# Patient Record
Sex: Male | Born: 1955
Health system: Southern US, Community
[De-identification: ages and names within clinical notes are randomized; demographics above are authoritative.]

## PROBLEM LIST (undated history)

## (undated) DIAGNOSIS — N4830 Priapism, unspecified: Secondary | ICD-10-CM

## (undated) DIAGNOSIS — Z8619 Personal history of other infectious and parasitic diseases: Principal | ICD-10-CM

## (undated) DIAGNOSIS — R7303 Prediabetes: Secondary | ICD-10-CM

## (undated) DIAGNOSIS — Z5111 Encounter for antineoplastic chemotherapy: Secondary | ICD-10-CM

## (undated) DIAGNOSIS — R0600 Dyspnea, unspecified: Secondary | ICD-10-CM

## (undated) DIAGNOSIS — I10 Essential (primary) hypertension: Secondary | ICD-10-CM

## (undated) DIAGNOSIS — C9000 Multiple myeloma not having achieved remission: Principal | ICD-10-CM

## (undated) DIAGNOSIS — K219 Gastro-esophageal reflux disease without esophagitis: Secondary | ICD-10-CM

## (undated) HISTORY — DX: Encounter for antineoplastic chemotherapy: Z51.11

## (undated) HISTORY — DX: Essential (primary) hypertension: I10

## (undated) HISTORY — PX: COLONOSCOPY: SHX174

## (undated) HISTORY — DX: Personal history of other infectious and parasitic diseases: Z86.19

## (undated) HISTORY — PX: POLYPECTOMY: SHX149

## (undated) HISTORY — DX: Multiple myeloma not having achieved remission: C90.00

---

## 2011-03-24 ENCOUNTER — Emergency Department (INDEPENDENT_AMBULATORY_CARE_PROVIDER_SITE_OTHER): Payer: Self-pay

## 2011-03-24 ENCOUNTER — Encounter: Payer: Self-pay | Admitting: *Deleted

## 2011-03-24 ENCOUNTER — Emergency Department (HOSPITAL_BASED_OUTPATIENT_CLINIC_OR_DEPARTMENT_OTHER)
Admission: EM | Admit: 2011-03-24 | Discharge: 2011-03-24 | Disposition: A | Discharge status: Home / Self Care | Payer: Self-pay | Attending: Emergency Medicine | Admitting: Emergency Medicine

## 2011-03-24 DIAGNOSIS — F1410 Cocaine abuse, uncomplicated: Secondary | ICD-10-CM | POA: Insufficient documentation

## 2011-03-24 DIAGNOSIS — R109 Unspecified abdominal pain: Secondary | ICD-10-CM | POA: Insufficient documentation

## 2011-03-24 LAB — URINALYSIS, ROUTINE W REFLEX MICROSCOPIC
Bilirubin Urine: NEGATIVE
Glucose, UA: NEGATIVE mg/dL
Hgb urine dipstick: NEGATIVE
Specific Gravity, Urine: 1.019 (ref 1.005–1.030)
Urobilinogen, UA: 0.2 mg/dL (ref 0.0–1.0)
pH: 6 (ref 5.0–8.0)

## 2011-03-24 LAB — COMPREHENSIVE METABOLIC PANEL
ALT: 46 U/L (ref 0–53)
AST: 31 U/L (ref 0–37)
Calcium: 9.2 mg/dL (ref 8.4–10.5)
GFR calc Af Amer: >60 mL/min (ref >60)
Glucose, Bld: 96 mg/dL (ref 70–99)
Sodium: 141 mEq/L (ref 135–145)
Total Protein: 6.6 g/dL (ref 6.0–8.3)

## 2011-03-24 LAB — DIFFERENTIAL
Basophils Relative: 1 % (ref 0–1)
Lymphs Abs: 1.8 10*3/uL (ref 0.7–4.0)
Monocytes Absolute: 0.6 10*3/uL (ref 0.1–1.0)
Monocytes Relative: 10 % (ref 3–12)
Neutro Abs: 3.2 10*3/uL (ref 1.7–7.7)

## 2011-03-24 LAB — CBC
HCT: 41.5 % (ref 39.0–52.0)
Hemoglobin: 14.3 g/dL (ref 13.0–17.0)
MCHC: 34.5 g/dL (ref 30.0–36.0)

## 2011-03-24 MED ORDER — SODIUM CHLORIDE 0.9 % IV SOLN
Freq: Once | INTRAVENOUS | Status: AC
  Administered 2011-03-24: 09:00:00 via INTRAVENOUS

## 2011-03-24 MED ORDER — ONDANSETRON HCL 4 MG/2ML IJ SOLN
4.0000 mg | Freq: Once | INTRAMUSCULAR | Status: AC
  Administered 2011-03-24: 4 mg via INTRAVENOUS
  Filled 2011-03-24: qty 2

## 2011-03-24 MED ORDER — IOHEXOL 300 MG/ML  SOLN
100.0000 mL | Freq: Once | INTRAMUSCULAR | Status: AC | PRN
  Administered 2011-03-24: 100 mL via INTRAVENOUS

## 2011-03-24 MED ORDER — ONDANSETRON HCL 4 MG PO TABS: 4.0000 mg | ORAL_TABLET | Freq: Four times a day (QID) | ORAL | Status: AC

## 2011-03-24 MED ORDER — KETOROLAC TROMETHAMINE 30 MG/ML IJ SOLN
30.0000 mg | Freq: Once | INTRAMUSCULAR | Status: AC
  Administered 2011-03-24: 30 mg via INTRAVENOUS
  Filled 2011-03-24: qty 1

## 2011-03-24 NOTE — ED Notes (Signed)
Error in documentation. Pt did not have sedation start.

## 2011-03-24 NOTE — ED Notes (Signed)
Reports generalized abdominal pain since yesterday. Nausea yesterday, denies vomiting. Denies urinary symptoms.

## 2011-03-24 NOTE — ED Provider Notes (Signed)
History     CSN: [id number] Arrival date & time: 03/24/2011  8:19 AM  Chief Complaint  Patient presents with  . Abdominal Pain   Patient is a 55 y.o. male presenting with abdominal pain. The history is provided by the patient.  Abdominal Pain The primary symptoms of the illness include abdominal pain. The primary symptoms of the illness do not include fever, shortness of breath or dysuria. The current episode started yesterday. The onset of the illness was gradual. The problem has not changed since onset. Associated with: after eating a little debbie, kool aid and a three muskateer candy bar. The patient has not had a change in bowel habit. Additional symptoms associated with the illness include constipation. Symptoms associated with the illness do not include chills, diaphoresis, hematuria or back pain. Significant associated medical issues do not include inflammatory bowel disease, diabetes, gallstones, liver disease or diverticulitis.   Is in a rehab facility for the last 9 days for crack abuse. He thought he may be constipated so he used maalox and mag citrate last night with loose BM today but still having the same lower ABD cramping/ unchanged since onset History reviewed. No pertinent past medical history.  History reviewed. No pertinent past surgical history.  No family history on file.  History  Substance Use Topics  . Smoking status: Current Everyday Smoker -- 1.0 packs/day    Types: Cigarettes  . Smokeless tobacco: Not on file  . Alcohol Use: No      Review of Systems  Constitutional: Negative for fever, chills and diaphoresis.  HENT: Negative for neck pain and neck stiffness.   Eyes: Negative for pain.  Respiratory: Negative for shortness of breath.   Cardiovascular: Negative for chest pain and leg swelling.  Gastrointestinal: Positive for abdominal pain and constipation.  Genitourinary: Negative for dysuria and hematuria.  Musculoskeletal: Negative for back pain.    Skin: Negative for rash.  Neurological: Negative for headaches.  All other systems reviewed and are negative.    Physical Exam  BP 139/93  Pulse 65  Temp(Src) 97.9 F (36.6 C) (Oral)  Resp 16  Ht 5\' 5"  (1.651 m)  Wt 135 lb (61.236 kg)  BMI 22.47 kg/m2  SpO2 100%  Physical Exam  Constitutional: He is oriented to person, place, and time. He appears well-developed and well-nourished.  HENT:  Head: Normocephalic and atraumatic.  Eyes: Conjunctivae and EOM are normal. Pupils are equal, round, and reactive to light.  Neck: Trachea normal. Neck supple. No thyromegaly present.  Cardiovascular: Normal rate, regular rhythm, S1 normal, S2 normal and normal pulses.     No systolic murmur is present   No diastolic murmur is present  Pulses:      Radial pulses are 2+ on the right side, and 2+ on the left side.  Pulmonary/Chest: Effort normal and breath sounds normal. He has no wheezes. He has no rhonchi. He has no rales. He exhibits no tenderness.  Abdominal: Soft. Normal appearance and bowel sounds are normal. He exhibits no distension, no ascites, no pulsatile midline mass and no mass. There is tenderness in the right lower quadrant and left lower quadrant. There is no rigidity, no rebound, no guarding, no CVA tenderness and negative Murphy's sign. No hernia.  Musculoskeletal:       BLE:s Calves nontender, no cords or erythema, negative Homans sign  Neurological: He is alert and oriented to person, place, and time. He has normal strength. No cranial nerve deficit or sensory deficit. GCS eye  subscore is 4. GCS verbal subscore is 5. GCS motor subscore is 6.  Skin: Skin is warm and dry. No rash noted. He is not diaphoretic.  Psychiatric: His speech is normal.       Cooperative and appropriate    ED Course  Procedures  MDM Adult male with lower ABD pain and tenderness, evaluated with CT scan, labs obtained and pain medications provided, avoiding narcotics as PT is currently  in rehab for  crack abuse. He has been eating 3 meals a day with snacks where as before rehab he was eating only 1-2 small meals per day. Serial exams no peritonitis, recheck at 12:25pm is feeling much better and feels comfortable to go back to [facility].     Results for orders placed during the hospital encounter of 03/24/11  COMPREHENSIVE METABOLIC PANEL      Component Value Range   Sodium 141  135 - 145 (mEq/L)   Potassium 4.4  3.5 - 5.1 (mEq/L)   Chloride 104  96 - 112 (mEq/L)   CO2 29  19 - 32 (mEq/L)   Glucose, Bld 96  70 - 99 (mg/dL)   BUN 13  6 - 23 (mg/dL)   Creatinine, Ser 8.46  0.50 - 1.35 (mg/dL)   Calcium 9.2  8.4 - 96.2 (mg/dL)   Total Protein 6.6  6.0 - 8.3 (g/dL)   Albumin 3.7  3.5 - 5.2 (g/dL)   AST 31  0 - 37 (U/L)   ALT 46  0 - 53 (U/L)   Alkaline Phosphatase 65  39 - 117 (U/L)   Total Bilirubin 0.2 (*) 0.3 - 1.2 (mg/dL)   GFR calc non Af Amer >60  >60 (mL/min)   GFR calc Af Amer >60  >60 (mL/min)  CBC      Component Value Range   WBC 5.9  4.0 - 10.5 (K/uL)   RBC 4.32  4.22 - 5.81 (MIL/uL)   Hemoglobin 14.3  13.0 - 17.0 (g/dL)   HCT 95.2  84.1 - 32.4 (%)   MCV 96.1  78.0 - 100.0 (fL)   MCH 33.1  26.0 - 34.0 (pg)   MCHC 34.5  30.0 - 36.0 (g/dL)   RDW 40.1  02.7 - 25.3 (%)   Platelets 209  150 - 400 (K/uL)  DIFFERENTIAL      Component Value Range   Neutrophils Relative 54  43 - 77 (%)   Neutro Abs 3.2  1.7 - 7.7 (K/uL)   Lymphocytes Relative 30  12 - 46 (%)   Lymphs Abs 1.8  0.7 - 4.0 (K/uL)   Monocytes Relative 10  3 - 12 (%)   Monocytes Absolute 0.6  0.1 - 1.0 (K/uL)   Eosinophils Relative 5  0 - 5 (%)   Eosinophils Absolute 0.3  0.0 - 0.7 (K/uL)   Basophils Relative 1  0 - 1 (%)   Basophils Absolute 0.0  0.0 - 0.1 (K/uL)  URINALYSIS, ROUTINE W REFLEX MICROSCOPIC      Component Value Range   Color, Urine YELLOW  YELLOW    Appearance CLEAR  CLEAR    Specific Gravity, Urine 1.019  1.005 - 1.030    pH 6.0  5.0 - 8.0    Glucose, UA NEGATIVE  NEGATIVE (mg/dL)   Hgb  urine dipstick NEGATIVE  NEGATIVE    Bilirubin Urine NEGATIVE  NEGATIVE    Ketones, ur NEGATIVE  NEGATIVE (mg/dL)   Protein, ur NEGATIVE  NEGATIVE (mg/dL)   Urobilinogen, UA 0.2  0.0 - 1.0 (mg/dL)   Nitrite NEGATIVE  NEGATIVE    Leukocytes, UA NEGATIVE  NEGATIVE      Ct Abdomen Pelvis W Contrast  03/24/2011  *RADIOLOGY REPORT*  Clinical Data: Mid to lower abdominal pain.  CT ABDOMEN AND PELVIS WITH CONTRAST  Technique:  Multidetector CT imaging of the abdomen and pelvis was performed following the standard protocol during bolus administration of intravenous contrast.  Contrast: 100 ml Omnipaque-300  Comparison: None.  Findings: The liver, spleen, pancreas, adrenal glands, and kidneys are normal.  The bowel is normal including the terminal ileum and appendix.  No free air or free fluid in the abdomen.  No acute osseous abnormality.  Mild arthritic changes of both femoral heads. Slight tortuosity of the thoracic aorta.  Is the patient hypertensive?  IMPRESSION: Benign-appearing abdomen and pelvis.  Original Report Authenticated By: Gwynn Burly, M.D.        Sunnie Nielsen, MD 03/24/11 1227

## 2011-03-24 NOTE — ED Notes (Signed)
Pt transferred to CT Scan 

## 2011-04-06 ENCOUNTER — Emergency Department (HOSPITAL_COMMUNITY)
Admission: EM | Admit: 2011-04-06 | Discharge: 2011-04-06 | Disposition: A | Discharge status: Home / Self Care | Payer: Self-pay | Attending: Emergency Medicine | Admitting: Emergency Medicine

## 2011-04-06 ENCOUNTER — Encounter (HOSPITAL_BASED_OUTPATIENT_CLINIC_OR_DEPARTMENT_OTHER): Payer: Self-pay | Admitting: *Deleted

## 2011-04-06 ENCOUNTER — Emergency Department (HOSPITAL_BASED_OUTPATIENT_CLINIC_OR_DEPARTMENT_OTHER)
Admission: EM | Admit: 2011-04-06 | Discharge: 2011-04-06 | Disposition: A | Discharge status: Other Acute Inpatient Hospital | Payer: Self-pay | Attending: Emergency Medicine | Admitting: Emergency Medicine

## 2011-04-06 DIAGNOSIS — N4830 Priapism, unspecified: Secondary | ICD-10-CM | POA: Insufficient documentation

## 2011-04-06 DIAGNOSIS — F17200 Nicotine dependence, unspecified, uncomplicated: Secondary | ICD-10-CM | POA: Insufficient documentation

## 2011-04-06 LAB — BASIC METABOLIC PANEL
CO2: 28 mEq/L (ref 19–32)
Calcium: 9.9 mg/dL (ref 8.4–10.5)
Chloride: 107 mEq/L (ref 96–112)
Creatinine, Ser: 0.9 mg/dL (ref 0.50–1.35)
Glucose, Bld: 101 mg/dL — ABNORMAL HIGH (ref 70–99)

## 2011-04-06 LAB — CBC
HCT: 40.7 % (ref 39.0–52.0)
Hemoglobin: 14.3 g/dL (ref 13.0–17.0)
MCH: 33.6 pg (ref 26.0–34.0)
MCV: 95.5 fL (ref 78.0–100.0)
RBC: 4.26 MIL/uL (ref 4.22–5.81)

## 2011-04-06 MED ORDER — MORPHINE SULFATE 4 MG/ML IJ SOLN
4.0000 mg | Freq: Once | INTRAMUSCULAR | Status: AC
  Administered 2011-04-06: 4 mg via INTRAVENOUS
  Filled 2011-04-06: qty 1

## 2011-04-06 MED ORDER — TERBUTALINE SULFATE 1 MG/ML IJ SOLN
0.5000 mg | Freq: Once | INTRAMUSCULAR | Status: AC
  Administered 2011-04-06: 0.5 mg via SUBCUTANEOUS

## 2011-04-06 MED ORDER — TERBUTALINE SULFATE 1 MG/ML IJ SOLN
INTRAMUSCULAR | Status: AC
  Filled 2011-04-06: qty 1

## 2011-04-06 MED ORDER — SODIUM CHLORIDE 0.9 % IV BOLUS (SEPSIS): 250.0000 mL | Freq: Once | INTRAVENOUS | Status: DC

## 2011-04-06 NOTE — ED Provider Notes (Signed)
Medical screening examination/treatment/procedure(s) were performed by non-physician practitioner and as supervising physician I was immediately available for consultation/collaboration.    Forbes Cellar, MD 04/06/11 [phone]

## 2011-04-06 NOTE — ED Notes (Signed)
Pt states he is in treatment at [facility]  Was given a new med for sleeping last night has had priaprism since then

## 2011-04-06 NOTE — ED Notes (Signed)
Report given to [facility] pt being transferred to [facility] long er to see urologist for the unresolved priaprism

## 2011-04-06 NOTE — ED Provider Notes (Signed)
History     CSN: [id number] Arrival date & time: 04/06/2011 11:44 AM  No chief complaint on file.  Patient is a 55 y.o. male presenting with male genitourinary complaint. The history is provided by the patient. No language interpreter was used.  Male GU Problem Primary symptoms include penile pain and priapism. This is a new problem. The current episode started 6 to 12 hours ago. The problem occurs constantly. The problem has not changed since onset.The symptoms occur spontaneously. Pertinent negatives include no nausea, no vomiting, no abdominal pain and no frequency. There has been no fever. He has tried narcotics for the symptoms. The treatment provided no relief. Sexual activity: non-contributory. Partner displays symptoms of an STD: no. Associated medical issues do not include gonorrhea, syphilis, chlamydia or erectile dysfunction.  Pt reports he is at [facility] treatment center.  Pt reports they gave him trazadone last pm to help him sleep.  Pt reports he has had an erection since 7:00am this morning.    History reviewed. No pertinent past medical history.  History reviewed. No pertinent past surgical history.  History reviewed. No pertinent family history.  History  Substance Use Topics  . Smoking status: Current Everyday Smoker -- 1.0 packs/day    Types: Cigarettes  . Smokeless tobacco: Not on file  . Alcohol Use: No      Review of Systems  Gastrointestinal: Negative for nausea, vomiting and abdominal pain.  Genitourinary: Positive for penile swelling and penile pain. Negative for frequency.  All other systems reviewed and are negative.    Physical Exam  BP 134/92  Pulse 80  Temp(Src) 98.4 F (36.9 C) (Oral)  Resp 20  SpO2 100%  Physical Exam  Nursing note and vitals reviewed. Constitutional: He is oriented to person, place, and time. He appears well-developed and well-nourished.  HENT:  Head: Normocephalic and atraumatic.  Eyes: Conjunctivae and EOM are normal.  Pupils are equal, round, and reactive to light.  Neck: Normal range of motion.  Cardiovascular: Normal rate.   Pulmonary/Chest: Effort normal.  Abdominal: Soft.  Genitourinary:       Penis tender, engorged  Musculoskeletal: Normal range of motion.  Neurological: He is alert and oriented to person, place, and time. He has normal reflexes.  Skin: Skin is warm and dry.  Psychiatric: He has a normal mood and affect.    ED Course  Procedures  MDM  patient given IV normal saline x1 L morphine 4 mg IV, terbutaline 0.5 subcutaneous. Patient observed x15 minutes with no change patient given additional 4 mg of morphine IV his discomfort is improved however he still has an erection. I spoke to Dr. Laverle Patter urology on call who advised transferring the patient to [facility] long emergency department where he could evaluate the patient. I advised Dr. Lynelle Doctor the patient will be transferred there by [facility].      Langston Masker, [place] 04/06/11 1338

## 2011-04-23 ENCOUNTER — Emergency Department (HOSPITAL_BASED_OUTPATIENT_CLINIC_OR_DEPARTMENT_OTHER)
Admission: EM | Admit: 2011-04-23 | Discharge: 2011-04-23 | Disposition: A | Discharge status: Home / Self Care | Payer: Self-pay | Attending: Emergency Medicine | Admitting: Emergency Medicine

## 2011-04-23 ENCOUNTER — Encounter (HOSPITAL_BASED_OUTPATIENT_CLINIC_OR_DEPARTMENT_OTHER): Payer: Self-pay | Admitting: *Deleted

## 2011-04-23 DIAGNOSIS — F17200 Nicotine dependence, unspecified, uncomplicated: Secondary | ICD-10-CM | POA: Insufficient documentation

## 2011-04-23 DIAGNOSIS — N4830 Priapism, unspecified: Secondary | ICD-10-CM | POA: Insufficient documentation

## 2011-04-23 MED ORDER — SODIUM CHLORIDE 0.9 % IV BOLUS (SEPSIS)
1000.0000 mL | Freq: Once | INTRAVENOUS | Status: AC
  Administered 2011-04-23: 1000 mL via INTRAVENOUS

## 2011-04-23 MED ORDER — PHENYLEPHRINE HCL 10 MG/ML IJ SOLN
0.1000 mg | Freq: Once | INTRAMUSCULAR | Status: AC
  Administered 2011-04-23: 0.1 mg via INTRAMUSCULAR
  Filled 2011-04-23: qty 1

## 2011-04-23 MED ORDER — MORPHINE SULFATE 4 MG/ML IJ SOLN
4.0000 mg | Freq: Once | INTRAMUSCULAR | Status: AC
  Administered 2011-04-23: 4 mg via INTRAVENOUS
  Filled 2011-04-23: qty 1

## 2011-04-23 MED ORDER — PHENYLEPHRINE HCL 10 MG/ML IJ SOLN
0.1000 mg | Freq: Once | INTRAMUSCULAR | Status: AC
  Administered 2011-04-23: 0.1 mg via INTRAMUSCULAR

## 2011-04-23 NOTE — ED Notes (Signed)
Erection resolved after injections

## 2011-04-23 NOTE — ED Provider Notes (Signed)
History     CSN: [id number] Arrival date & time: 04/23/2011  5:52 PM  Chief Complaint  Patient presents with  . Penis Pain   HPI Comments: 55yoM h/o cocaine abuse, priapism thought to be 2/2 trazodone pw painful erection. Per patient began with awakening this morning >12 hour pta. No relief at home. Denies painful urination (except as noted nsg notes) Denies hematuria//freq/urgency. No abd pain/n/v. In rehab, last cocaine use 8/31, states that he has not continued to take trazodone. No h/o sickle cell disease   Patient is a 55 y.o. male presenting with penile pain.  Penis Pain    Pmhx: cocaine abuse  History reviewed. No pertinent past surgical history.  History reviewed. No pertinent family history.  History  Substance Use Topics  . Smoking status: Current Everyday Smoker -- 1.0 packs/day    Types: Cigarettes  . Smokeless tobacco: Not on file  . Alcohol Use: No     Review of Systems  Genitourinary: Positive for penile pain.  All other systems reviewed and are negative.  except as noted HPI   Physical Exam  BP 152/104  Pulse 66  Temp(Src) 98.6 F (37 C) (Oral)  Resp 20  Ht 5\' 5"  (1.651 m)  Wt 150 lb (68.04 kg)  BMI 24.96 kg/m2  SpO2 100%  Physical Exam  Nursing note and vitals reviewed. Constitutional: He is oriented to person, place, and time. He appears well-developed and well-nourished. No distress.  HENT:  Head: Atraumatic.  Mouth/Throat: Oropharynx is clear and moist.  Eyes: Conjunctivae are normal. Pupils are equal, round, and reactive to light.  Neck: Neck supple.  Cardiovascular: Normal rate, regular rhythm, normal heart sounds and intact distal pulses.  Exam reveals no gallop and no friction rub.   No murmur heard. Pulmonary/Chest: Effort normal. No respiratory distress. He has no wheezes. He has no rales.  Abdominal: Soft. Bowel sounds are normal. There is no tenderness. There is no rebound and no guarding.  Genitourinary:       +erect penis, no  urethral discharge  Musculoskeletal: Normal range of motion. He exhibits no edema and no tenderness.  Neurological: He is alert and oriented to person, place, and time.  Skin: Skin is warm and dry.  Psychiatric: He has a normal mood and affect.    ED Course  Procedures Verbal conset obtained. Risks/benefits explained Time out performed prior to procedure, pt verified Penis prepped with alcohol 1% phenylephrine .1mg  injected x 2 into shaft of penis 10 minutes apart with complete relief of priapism.   MDM Priapism, resolved with above measures which were recommended by urology Dr. Patsi Sears. F/U urology outpatient. Precautions for return.   Stefano Gaul, MD       Forbes Cellar, MD 04/24/11 [place] Manners

## 2011-04-23 NOTE — ED Notes (Signed)
Pt states this happened August 22 after ? Taking Trazadone. Seen by neurosurgeon. Woke up this a.m. With same. Painful urination.

## 2011-05-10 ENCOUNTER — Emergency Department (HOSPITAL_COMMUNITY)
Admission: EM | Admit: 2011-05-10 | Discharge: 2011-05-10 | Disposition: A | Discharge status: Home / Self Care | Payer: Self-pay | Attending: Emergency Medicine | Admitting: Emergency Medicine

## 2011-05-10 DIAGNOSIS — N4830 Priapism, unspecified: Secondary | ICD-10-CM | POA: Insufficient documentation

## 2011-05-10 DIAGNOSIS — F17200 Nicotine dependence, unspecified, uncomplicated: Secondary | ICD-10-CM | POA: Insufficient documentation

## 2011-05-15 NOTE — Consult Note (Signed)
NAME:  Erik Nguyen, Erik Nguyen NO.:  [id number]  MEDICAL RECORD NO.:  [id number]  LOCATION:  [facility]                         FACILITY:  [facility]  PHYSICIAN:  Valetta Fuller, MD    DATE OF BIRTH:  1955-09-15  DATE OF CONSULTATION:  05/10/2011 DATE OF DISCHARGE:  05/10/2011                                CONSULTATION   REASON FOR CONSULTATION:  Priapism.  HISTORY OF PRESENT ILLNESS:  This is a 55 year old gentleman with no significant past medical history.  He awakened this morning at approximately 8 a.m. with a penile erection.  He was unable to detumesce himself.  The erection became painful over the next 3-4 hours.  He presented to [facility] Emergency Department for treatment and evaluation at that time.  The patient states this initially occurred in August 2012 after taking a prescription for trazodone.  He presented to the emergency room at that time for long-lasting painful erection.  He was irrigated and injected with phenylephrine at that visit.  The patient states that this has occurred every 2-3 weeks since that time in August, requiring irrigation and injection in the emergency room.  He was to follow up with Dr. Laverle Patter on an outpatient basis for further evaluation.  He has not made an appointment as of yet.  He was also told in August to stop taking his prescription for trazodone.  Today, the patient continues to complain of long-lasting painful erection.  He denies any sickle cell disease/trait.  He denies taking any oral medications including antipsychotics or medication such as PDS inhibitor such as Viagra.  He denies any use of illegal substances such as cocaine, marijuana, or Ecstasy.  He does have a history of illicit drug use including cocaine.  He denies any fever, chills, nausea, or vomiting.  He was giving subcutaneous terbutaline in the emergency room today for 3 doses without response.  PAST MEDICAL HISTORY:  Tobacco abuse.  ALLERGIES:  He has no  known drug allergies.  MEDICATIONS:  He takes no home medications.  FAMILY HISTORY:  Noncontributory.  SOCIAL HISTORY:  [place]  He does currently use tobacco.  He denies any recent alcohol or drug use.  He does have a history of alcohol and illicit drug use including cocaine.  REVIEW OF SYSTEMS:  As stated per HPI, complains of long-lasting painful erection.  Denies any fever, chills, nausea, or vomiting.  Denies any chest pain or shortness of breath.  PHYSICAL EXAMINATION:  VITAL SIGNS:  Temperature 97.9, pulse 67, respirations 20, blood pressure 126/80. CONSTITUTIONAL:  He is a well-developed, well-nourished white male, in no acute distress, although in obvious discomfort. HEENT:  Normocephalic, atraumatic.  Oropharynx is clear. ABDOMEN:  Soft, nontender, and nondistended. 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  He does have bilateral descended testes without lesion or mass. NEURO:  Remote and recent memory are intact.  PROCEDURE:  Glans penis was cleansed with Betadine solution usingsterile technique.  Penile nerve block was performed with 1% plain lidocaine at the bases of the penis and lateral position.  After penile nerve block was performed, a 19-gauge butterfly needle was  inserted and irrigation was performed with removal of approximately 30 cc of dark venous blood.  At that time, phenylephrine injection was performed and flushed with approximately 10 cc of saline to flush through.  Again, he was irrigated afterward for removal of approximately 15-20 cc of more dark venous blood.  The penis did detumesce.  The 19-gauge needle was removed and the pressure was applied to the needlestick site.  Bandage was applied afterward and ice pack was placed.  IMPRESSION/PLAN:  Recurrent priapism.  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 Bandage  and pressure was applied to injection site along with ice pack. If penis remains detumescence in approximately 30-45 minutes, he may be discharged home with plans to follow up on an outpatient basis with Dr. Laverle Patter for further workup and evaluation.     Delia Chimes, NP   ______________________________ Valetta Fuller, MD    MA/MEDQ  D:  05/10/2011  T:  05/10/2011  Job:  [id number]  Electronically Signed by Delia Chimes NP on 05/15/2011 12:06:05 PM Electronically Signed by Barron Alvine M.D. on 05/15/2011 06:49:24 PM

## 2012-06-21 ENCOUNTER — Emergency Department (HOSPITAL_COMMUNITY): Payer: Self-pay

## 2012-06-21 ENCOUNTER — Emergency Department (HOSPITAL_COMMUNITY)
Admission: EM | Admit: 2012-06-21 | Discharge: 2012-06-21 | Disposition: A | Discharge status: Home / Self Care | Payer: Self-pay | Attending: Emergency Medicine | Admitting: Emergency Medicine

## 2012-06-21 ENCOUNTER — Encounter (HOSPITAL_COMMUNITY): Payer: Self-pay | Admitting: Emergency Medicine

## 2012-06-21 DIAGNOSIS — M549 Dorsalgia, unspecified: Secondary | ICD-10-CM | POA: Insufficient documentation

## 2012-06-21 DIAGNOSIS — F17200 Nicotine dependence, unspecified, uncomplicated: Secondary | ICD-10-CM | POA: Insufficient documentation

## 2012-06-21 MED ORDER — DIAZEPAM 5 MG PO TABS: 5.0000 mg | ORAL_TABLET | Freq: Four times a day (QID) | ORAL | Status: DC | PRN

## 2012-06-21 MED ORDER — IBUPROFEN 600 MG PO TABS: 600.0000 mg | ORAL_TABLET | Freq: Four times a day (QID) | ORAL | Status: DC | PRN

## 2012-06-21 MED ORDER — KETOROLAC TROMETHAMINE 60 MG/2ML IM SOLN
60.0000 mg | Freq: Once | INTRAMUSCULAR | Status: AC
  Administered 2012-06-21: 60 mg via INTRAMUSCULAR
  Filled 2012-06-21: qty 2

## 2012-06-21 MED ORDER — OXYCODONE-ACETAMINOPHEN 5-325 MG PO TABS: 1.0000 | ORAL_TABLET | Freq: Four times a day (QID) | ORAL | Status: DC | PRN

## 2012-06-21 MED ORDER — HYDROMORPHONE HCL PF 1 MG/ML IJ SOLN
1.0000 mg | Freq: Once | INTRAMUSCULAR | Status: AC
  Administered 2012-06-21: 1 mg via INTRAMUSCULAR
  Filled 2012-06-21: qty 1

## 2012-06-21 NOTE — ED Notes (Signed)
Pt states this past Monday he was at work filling up leaf blowers w/ gas when he went to stand back up from picking up the gas can his lower back started to hurt, pt states about a year ago he had a back injury at work and was out on worker's comp x 4 months then was cleared to go back to work, pt states did not come to hospital on Monday and has been unable to get out of bed until today to come to hospital d/t lower back pain. Pt states having lower back pain 7/10, achy/sharp feeling.

## 2012-06-21 NOTE — ED Provider Notes (Signed)
History     CSN: [id number]  Arrival date & time 06/21/12  1116   First MD Initiated Contact with Patient 06/21/12 1137      No chief complaint on file.   (Consider location/radiation/quality/duration/timing/severity/associated sxs/prior treatment) The history is provided by the patient.   patient presents with back pain. He states he had previous back injury at work a urine that ago. He was then cleared to go back to work. He states that he had had an injection of steroids and was told it is a small fracture. He had been doing well up until Monday when he was filling the floors with gas when he stood back up his back started to hurt. He states he did well that day and finished work at the next day he was unable to get out of bed due to the pain. No lack of bladder or bowel control. No numbness or weakness. He states he feels as if his strength would be fine. no abdominal pain. No fevers.   History reviewed. No pertinent past medical history.  History reviewed. No pertinent past surgical history.  History reviewed. No pertinent family history.  History  Substance Use Topics  . Smoking status: Current Every Day Smoker -- 1.0 packs/day    Types: Cigarettes  . Smokeless tobacco: Never Used  . Alcohol Use: Yes      Review of Systems  Constitutional: Negative for fever and chills.  Respiratory: Negative for shortness of breath.   Cardiovascular: Negative for leg swelling.  Gastrointestinal: Negative for abdominal pain.  Genitourinary: Negative for dysuria and flank pain.  Musculoskeletal: Positive for back pain. Negative for myalgias, joint swelling and gait problem.  Neurological: Negative for headaches.  Hematological: Negative for adenopathy.  Psychiatric/Behavioral: Negative for confusion.    Allergies  Review of patient's allergies indicates no known allergies.  Home Medications   Current Outpatient Rx  Name  Route  Sig  Dispense  Refill  . DIAZEPAM 5 MG PO TABS  Oral   Take 1 tablet (5 mg total) by mouth every 6 (six) hours as needed for anxiety.   10 tablet   0   . IBUPROFEN 600 MG PO TABS   Oral   Take 1 tablet (600 mg total) by mouth every 6 (six) hours as needed for pain.   20 tablet   0   . OXYCODONE-ACETAMINOPHEN 5-325 MG PO TABS   Oral   Take 1-2 tablets by mouth every 6 (six) hours as needed for pain.   15 tablet   0     BP 137/90  Pulse 66  Temp 98.8 F (37.1 C) (Oral)  Resp 18  SpO2 100%  Physical Exam  Nursing note and vitals reviewed. Constitutional: He is oriented to person, place, and time. He appears well-developed and well-nourished.  HENT:  Head: Normocephalic and atraumatic.  Eyes: EOM are normal. Pupils are equal, round, and reactive to light.  Neck: Normal range of motion. Neck supple.  Cardiovascular: Normal rate, regular rhythm and normal heart sounds.   No murmur heard. Pulmonary/Chest: Effort normal and breath sounds normal.  Abdominal: Soft. Bowel sounds are normal. He exhibits no distension and no mass. There is no tenderness. There is no rebound and no guarding.  Musculoskeletal: Normal range of motion. He exhibits no edema.       Mild lumbar tenderness with paraspinal muscle spasm, worse on the right.  Neurological: He is alert and oriented to person, place, and time. No cranial nerve deficit.  Strength and sensation intact distally. Good flexion and extension at the ankle and toes. Patient states he is numb in the left great toe, however sensation is grossly intact  Skin: Skin is warm and dry.  Psychiatric: He has a normal mood and affect.    ED Course  Procedures (including critical care time)  Labs Reviewed - No data to display Dg Lumbar Spine Complete  06/21/2012  *RADIOLOGY REPORT*  Clinical Data: Low back pain, history of previous fracture  LUMBAR SPINE - COMPLETE 4+ VIEW  Comparison: Sagittal images CT scan 03/24/11  Findings: Five views of the lumbar spine submitted.  No acute fracture  or subluxation.  Multilevel mild anterior spurring. Alignment disc spaces and vertebral height are preserved.  IMPRESSION: No acute fracture or subluxation.  Multilevel mild anterior spurring.   Original Report Authenticated By: Natasha Mead, M.D.      1. Back pain       MDM  Patient with back pain. Began after lifting some things. Because he said that he had a previous fracture x-ray was done. It did not show a fracture. He was given medications and followup as needed.. No red flags.         Juliet Rude. Rubin Payor, MD 06/22/12 [phone]

## 2012-06-21 NOTE — Progress Notes (Signed)
Pt listed as self pay with no insurance coverage Pt confirms he is self pay [place] resident.  CM and [facility] coordinator spoke with him Pt refused offered [facility] services to assist with finding a [place] self pay provider

## 2012-08-15 DIAGNOSIS — N4830 Priapism, unspecified: Secondary | ICD-10-CM

## 2012-08-15 HISTORY — DX: Priapism, unspecified: N48.30

## 2012-12-25 ENCOUNTER — Emergency Department (HOSPITAL_COMMUNITY)
Admission: EM | Admit: 2012-12-25 | Discharge: 2012-12-25 | Disposition: A | Discharge status: Home / Self Care | Payer: Self-pay | Attending: Emergency Medicine | Admitting: Emergency Medicine

## 2012-12-25 ENCOUNTER — Encounter (HOSPITAL_COMMUNITY): Payer: Self-pay | Admitting: *Deleted

## 2012-12-25 DIAGNOSIS — F17200 Nicotine dependence, unspecified, uncomplicated: Secondary | ICD-10-CM | POA: Insufficient documentation

## 2012-12-25 DIAGNOSIS — N4830 Priapism, unspecified: Secondary | ICD-10-CM | POA: Insufficient documentation

## 2012-12-25 LAB — URINALYSIS, ROUTINE W REFLEX MICROSCOPIC
Bilirubin Urine: NEGATIVE
Glucose, UA: NEGATIVE mg/dL
Hgb urine dipstick: NEGATIVE
Ketones, ur: NEGATIVE mg/dL
Nitrite: NEGATIVE
pH: 7.5 (ref 5.0–8.0)

## 2012-12-25 LAB — BASIC METABOLIC PANEL
BUN: 10 mg/dL (ref 6–23)
Chloride: 101 mEq/L (ref 96–112)
Creatinine, Ser: 0.93 mg/dL (ref 0.50–1.35)
GFR calc Af Amer: >90 mL/min (ref >90)
Glucose, Bld: 113 mg/dL — ABNORMAL HIGH (ref 70–99)

## 2012-12-25 LAB — RAPID URINE DRUG SCREEN, HOSP PERFORMED
Amphetamines: NOT DETECTED
Barbiturates: NOT DETECTED
Benzodiazepines: NOT DETECTED
Cocaine: NOT DETECTED
Tetrahydrocannabinol: NOT DETECTED

## 2012-12-25 LAB — CBC WITH DIFFERENTIAL/PLATELET
Basophils Relative: 0 % (ref 0–1)
HCT: 41.6 % (ref 39.0–52.0)
Hemoglobin: 15 g/dL (ref 13.0–17.0)
Lymphs Abs: 2.6 10*3/uL (ref 0.7–4.0)
MCH: 33.3 pg (ref 26.0–34.0)
MCHC: 36.1 g/dL — ABNORMAL HIGH (ref 30.0–36.0)
Monocytes Absolute: 1 10*3/uL (ref 0.1–1.0)
Monocytes Relative: 11 % (ref 3–12)
Neutro Abs: 5.4 10*3/uL (ref 1.7–7.7)

## 2012-12-25 LAB — POCT I-STAT 3, VENOUS BLOOD GAS (G3P V)
Acid-base deficit: 1 mmol/L (ref 0.0–2.0)
pH, Ven: 7.383 — ABNORMAL HIGH (ref 7.250–7.300)

## 2012-12-25 MED ORDER — OXYCODONE-ACETAMINOPHEN 5-325 MG PO TABS
2.0000 | ORAL_TABLET | Freq: Once | ORAL | Status: AC
  Administered 2012-12-25: 2 via ORAL
  Filled 2012-12-25: qty 2

## 2012-12-25 MED ORDER — CEPHALEXIN 250 MG PO CAPS
500.0000 mg | ORAL_CAPSULE | Freq: Once | ORAL | Status: AC
  Administered 2012-12-25: 500 mg via ORAL
  Filled 2012-12-25: qty 2

## 2012-12-25 MED ORDER — HYDROMORPHONE HCL PF 1 MG/ML IJ SOLN
1.0000 mg | Freq: Once | INTRAMUSCULAR | Status: AC
  Administered 2012-12-25: 1 mg via INTRAVENOUS
  Filled 2012-12-25: qty 1

## 2012-12-25 MED ORDER — PHENYLEPHRINE 200 MCG/ML FOR PRIAPISM / HYPOTENSION
200.0000 ug | Freq: Once | INTRAMUSCULAR | Status: AC
  Administered 2012-12-25: 200 ug via INTRACAVERNOUS
  Filled 2012-12-25: qty 50

## 2012-12-25 MED ORDER — DOCUSATE SODIUM 100 MG PO CAPS: 100.0000 mg | ORAL_CAPSULE | Freq: Two times a day (BID) | ORAL | Status: DC

## 2012-12-25 MED ORDER — CEPHALEXIN 500 MG PO CAPS: 500.0000 mg | ORAL_CAPSULE | Freq: Four times a day (QID) | ORAL | Status: DC

## 2012-12-25 MED ORDER — HYDROCORTISONE ACETATE 25 MG RE SUPP: 25.0000 mg | Freq: Two times a day (BID) | RECTAL | Status: DC

## 2012-12-25 MED ORDER — OXYCODONE-ACETAMINOPHEN 5-325 MG PO TABS: 2.0000 | ORAL_TABLET | Freq: Four times a day (QID) | ORAL | Status: DC | PRN

## 2012-12-25 MED ORDER — HYDROMORPHONE HCL PF 2 MG/ML IJ SOLN
2.0000 mg | Freq: Once | INTRAMUSCULAR | Status: AC
  Administered 2012-12-25: 2 mg via INTRAMUSCULAR
  Filled 2012-12-25: qty 1

## 2012-12-25 NOTE — ED Provider Notes (Signed)
History     CSN: [id number]  Arrival date & time 12/25/12  0039   First MD Initiated Contact with Patient 12/25/12 [phone]      Chief Complaint  Patient presents with  . Personal Problem    priapism  . Hemorrhoids    (Consider location/radiation/quality/duration/timing/severity/associated sxs/prior treatment) HPI 57 year old male presents to emergency room with complaint of priapism.  He reports he woke this morning at 8 AM with erection.  He was expecting it better, but has not throughout the day.  Patient presents to the emergency apartment at 12:30 with complaint of worsening pain from his persistent erection.  Patient has had history of same in prior associated with trazodone use.  He reports he is no longer taking trazodone.  Patient also has a remote history of cocaine use and is adamant that he is no longer abusing drugs.  No history of sickle cell disease.  History reviewed. No pertinent past medical history.  History reviewed. No pertinent past surgical history.  History reviewed. No pertinent family history.  History  Substance Use Topics  . Smoking status: Current Every Day Smoker -- 1.00 packs/day    Types: Cigarettes  . Smokeless tobacco: Never Used  . Alcohol Use: Yes      Review of Systems  All other systems reviewed and are negative.    Allergies  Review of patient's allergies indicates no known allergies.  Home Medications  No current outpatient prescriptions on file.  BP 171/100  Pulse 79  Temp(Src) 98.3 F (36.8 C) (Oral)  Resp 16  SpO2 100%  Physical Exam  Nursing note and vitals reviewed. Constitutional: He is oriented to person, place, and time. He appears well-developed and well-nourished. He appears distressed (Patient appears to be in significant pain, writhing on the bed).  HENT:  Head: Normocephalic and atraumatic.  Nose: Nose normal.  Mouth/Throat: Oropharynx is clear and moist.  Eyes: Conjunctivae and EOM are normal. Pupils are  equal, round, and reactive to light.  Neck: Normal range of motion. Neck supple. No JVD present. No tracheal deviation present. No thyromegaly present.  Cardiovascular: Normal rate, regular rhythm, normal heart sounds and intact distal pulses.  Exam reveals no gallop and no friction rub.   No murmur heard. Pulmonary/Chest: Effort normal and breath sounds normal. No stridor. No respiratory distress. He has no wheezes. He has no rales. He exhibits no tenderness.  Abdominal: Soft. Bowel sounds are normal. He exhibits no distension and no mass. There is no tenderness. There is no rebound and no guarding.  Genitourinary:  Patient noted to have erect penis.  Pain with any palpation or movement of the penis  Musculoskeletal: Normal range of motion. He exhibits no edema and no tenderness.  Lymphadenopathy:    He has no cervical adenopathy.  Neurological: He is alert and oriented to person, place, and time. He exhibits normal muscle tone. Coordination normal.  Skin: Skin is warm and dry. No rash noted. No erythema. No pallor.  Psychiatric: He has a normal mood and affect. His behavior is normal. Judgment and thought content normal.    ED Course  Procedures (including critical care time) CRITICAL CARE Performed by: Olivia Mackie Total critical care time: 60 min Critical care time was exclusive of separately billable procedures and treating other patients. Critical care was necessary to treat or prevent imminent or life-threatening deterioration. Critical care was time spent personally by me on the following activities: development of treatment plan with patient and/or surrogate as well as nursing,  discussions with consultants, evaluation of patient's response to treatment, examination of patient, obtaining history from patient or surrogate, ordering and performing treatments and interventions, ordering and review of laboratory studies, ordering and review of radiographic studies, pulse oximetry and  re-evaluation of patient's condition.  Labs Reviewed - No data to display No results found.  0300 PRIAPISM TREATMENT (injection) Patient was prepped and draped in standard sterile fashion Just prior to procedure a timeout was performed Left corpus cavernosum was entered with a 25-gauge syringe Blood was aspirated 1 cc of 200 mcg/ml phenylephrine was injected The patient tolerated the procedure fair Only slight improvement after 1 hour after injection, patient reports pain is better  0430 PRIAPISM TREATMENT (injection) Patient was prepped and draped in standard sterile fashion Just prior to procedure a timeout was performed Right corpus cavernosum was entered with a 25-gauge syringe Blood was aspirated 1 cc of 200 mcg/ml phenylephrine was injected The patient tolerated the procedure fair   6:00 AM No improvement after 20 minutes.  Call to urology answered by Dr Margarita Grizzle who will see the patient in the ED for further evaluation and treatment.  No diagnosis found.    MDM  57 yo male with prolonged priapism without improvement with two injections of phenylephrine.  Dr Margarita Grizzle has seen the patient and after bedside irrigation and further injection of phenylephrine, erection has subsided.  He recommends keflex prophylactically.          Olivia Mackie, MD 12/27/12 [phone]

## 2012-12-25 NOTE — ED Notes (Addendum)
Pt states that he woke up this morning and he went to the bathroom with an erection around 08:30 and it has not gone away. Pt denies any medications. Pt states this is the 3rd time it has happened in the last year. Pt also having problems with hemhrroids.

## 2012-12-25 NOTE — Consult Note (Signed)
Urology Consult  Requesting provider:  Dr. Norlene Campbell  CC: Priapism  HPI:  57 year old male presents to the ER with priapism. This is Erik Nguyen prior. The last time it occurred was in 2012. This required irrigation by Dr. Manon Hilding. I was called after the patient had a priapism for 22 hours. This began at 8 AM on 12/24/12. He'll will put it in place. It was a sudden onset. It is associated with a firm erection. It is painful in nature. He has a history of cocaine use and trazodone use. He had discontinued trazodone use since his last priapism episode. He denies use of cocaine and his drug screen was also negative for cocaine use. Nothing makes this better or worse. He has received penile injection by ER physician of two shots of of phenylephrine. We discussed management options which include observation, penile urination, penile injection of phenylephrine, and shunting. We discussed the risks, benefits, side effects, and likelihood of achieving goals. I explained that he would have a high risk for having erectile dysfunction due to the fact that he has had a priapism for 22 hours. He denies erectile dysfunction at this time. 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  PMH: History reviewed. No pertinent past medical history.  PSH: History reviewed. No pertinent past surgical history.  Allergies: No Known Allergies  Medications:  (Not in a hospital admission) Denies trazadone.  Social History: History   Social History  . Marital Status: Legally Separated    Spouse Name: N/A    Number of Children: N/A  . Years of Education: N/A   Occupational History  . Not on file.   Social History Main Topics  . Smoking status: Current Every Day Smoker -- 1.00 packs/day    Types: Cigarettes  . Smokeless tobacco: Never Used  . Alcohol Use: Yes  . Drug Use: 7.00 per week    Special: Cocaine     Comment: Former  . Sexually Active: Not on file   Other Topics  Concern  . Not on file   Social History Narrative  . No narrative on file    Family History: History reviewed. No pertinent family history.  Review of Systems: Positive: Painful erection. Negative: Chest pain, SOB, or fever.  A further 10 point review of systems was negative except what is listed in the HPI.  Physical Exam: Filed Vitals:   12/25/12 0042  BP: 171/100  Pulse: 79  Temp: 98.3 F (36.8 C)  Resp: 16    General: No acute distress.  Awake. Head:  Normocephalic.  Atraumatic. ENT:  EOMI.  Mucous membranes moist Neck:  Supple.  No lymphadenopathy. CV:  S1 present. S2 present. Regular rate. Pulmonary: Equal effort bilaterally.  Clear to auscultation bilaterally. Abdomen: Soft.  Non- tender to palpation. Skin:  Normal turgor.  No visible rash. Extremity: No gross deformity of bilateral upper extremities.  No gross deformity of    bilateral lower extremities. Neurologic: Alert. Appropriate mood.  Penis:  No lesions. Firm and TTP. Positive curvature to the left.   Studies:  Recent Labs     12/25/12  0523  HGB  15.0  WBC  9.2  PLT  242    Recent Labs     12/25/12  0523  NA  137  K  3.6  CL  101  CO2  24  BUN  10  CREATININE  0.93  CALCIUM  9.4  GFRNONAA  >90  GFRAA  >  90     No results found for this basename: PT, INR, APTT,  in the last 72 hours   No components found with this basename: ABG,   Procedure: After informed consent was obtained, the patient's penis was cleansed with Betadine prep solution. I then inserted a 22-gauge butterfly needle into the right and left corpora cavernosa. I first drew blood gas from the blood that returned and sent this to the lab. I then irrigated each butterfly with injectable grade normal saline with return of old blood. After this I began to inject 200 mcg per mL of phenylephrine, one mL at a time, every 5 minutes while the patient was on a blood pressure and heart rate monitor. His erection responded almost  immediately with detumescence and relief of his pain. I injected a total of 8 cc since he had artery had 2 cc of injection previously. All that remained was woody edema. The needles were removed and there was good hemostasis.    Assessment:  Priapism  Plan: -Resolution of priapism with irrigation & injection.  -I recommend observing the patient for approximately 45 minutes. If he remains without erection then he should be discharged home. He can followup on an as-needed basis.  -He was warned that if this occurs again he would need to return within the first 4 hours of having an erection.  -Monitor his blood pressure, and if it remains high I would recommend treatment.  -Recommend a prescription for Keflex to cover for this dictation for 3 days.    Pager: [phone]    CC: Dr. Norlene Campbell

## 2012-12-25 NOTE — ED Notes (Signed)
Urologist performed Penile nerve block and Priapism injection and irrigation. This nurse assisted. No complications.

## 2013-02-18 ENCOUNTER — Encounter (HOSPITAL_COMMUNITY): Payer: Self-pay | Admitting: *Deleted

## 2013-02-18 ENCOUNTER — Emergency Department (HOSPITAL_COMMUNITY)
Admission: EM | Admit: 2013-02-18 | Discharge: 2013-02-18 | Disposition: A | Discharge status: Home / Self Care | Payer: Self-pay | Attending: Emergency Medicine | Admitting: Emergency Medicine

## 2013-02-18 DIAGNOSIS — F17200 Nicotine dependence, unspecified, uncomplicated: Secondary | ICD-10-CM | POA: Insufficient documentation

## 2013-02-18 DIAGNOSIS — Y93H2 Activity, gardening and landscaping: Secondary | ICD-10-CM | POA: Insufficient documentation

## 2013-02-18 DIAGNOSIS — X503XXA Overexertion from repetitive movements, initial encounter: Secondary | ICD-10-CM | POA: Insufficient documentation

## 2013-02-18 DIAGNOSIS — Y929 Unspecified place or not applicable: Secondary | ICD-10-CM | POA: Insufficient documentation

## 2013-02-18 DIAGNOSIS — S335XXA Sprain of ligaments of lumbar spine, initial encounter: Secondary | ICD-10-CM | POA: Insufficient documentation

## 2013-02-18 DIAGNOSIS — S39012A Strain of muscle, fascia and tendon of lower back, initial encounter: Secondary | ICD-10-CM

## 2013-02-18 MED ORDER — CYCLOBENZAPRINE HCL 10 MG PO TABS: 10.0000 mg | ORAL_TABLET | Freq: Three times a day (TID) | ORAL | Status: DC | PRN

## 2013-02-18 MED ORDER — PREDNISONE 50 MG PO TABS: 50.0000 mg | ORAL_TABLET | Freq: Every day | ORAL | Status: DC

## 2013-02-18 MED ORDER — HYDROCODONE-ACETAMINOPHEN 5-325 MG PO TABS: 1.0000 | ORAL_TABLET | Freq: Four times a day (QID) | ORAL | Status: DC | PRN

## 2013-02-18 MED ORDER — HYDROCODONE-ACETAMINOPHEN 5-325 MG PO TABS
1.0000 | ORAL_TABLET | Freq: Once | ORAL | Status: AC
  Administered 2013-02-18: 1 via ORAL
  Filled 2013-02-18: qty 1

## 2013-02-18 MED ORDER — PREDNISONE 20 MG PO TABS
60.0000 mg | ORAL_TABLET | Freq: Once | ORAL | Status: AC
  Administered 2013-02-18: 60 mg via ORAL
  Filled 2013-02-18: qty 3

## 2013-02-18 MED ORDER — KETOROLAC TROMETHAMINE 60 MG/2ML IM SOLN
60.0000 mg | Freq: Once | INTRAMUSCULAR | Status: AC
  Administered 2013-02-18: 60 mg via INTRAMUSCULAR
  Filled 2013-02-18: qty 2

## 2013-02-18 NOTE — ED Provider Notes (Signed)
History    CSN: [id number] Arrival date & time 02/18/13  1259  First MD Initiated Contact with Patient 02/18/13 1310     Chief Complaint  Patient presents with  . Back Pain   (Consider location/radiation/quality/duration/timing/severity/associated sxs/prior Treatment) HPI Patient just emergency department with low back pain.  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  Patient denies numbness, weakness, nausea, vomiting, abdominal pain, incontinence, fever, dysuria, or syncope.  The patient, states, that he did not take anything prior to arrival.  Patient, states, that palpation and movement make his pain, worse.  Patient, states nothing seems to make his condition, better.  Patient, states he is able to walk, but this causes pain     History reviewed. No pertinent past medical history. History reviewed. No pertinent past surgical history. No family history on file. History  Substance Use Topics  . Smoking status: Current Every Day Smoker -- 1.00 packs/day    Types: Cigarettes  . Smokeless tobacco: Never Used  . Alcohol Use: Yes    Review of Systems All other systems negative except as documented in the HPI. All pertinent positives and negatives as reviewed in the HPI. Allergies  Review of patient's allergies indicates no known allergies.  Home Medications   Current Outpatient Rx  Name  Route  Sig  Dispense  Refill  . acetaminophen (TYLENOL) 500 MG tablet   Oral   Take 500 mg by mouth every 6 (six) hours as needed for pain.         Marland Kitchen ibuprofen (ADVIL,MOTRIN) 200 MG tablet   Oral   Take 200 mg by mouth every 6 (six) hours as needed for pain.         . Menthol-Methyl Salicylate (MUSCLE RUB) 10-15 % CREA   Topical   Apply 1 application topically as needed.         . cephALEXin (KEFLEX) 500 MG capsule   Oral  Take 1 capsule (500 mg total) by mouth 4 (four) times daily.   40 capsule   0    BP 146/81  Pulse 77  Temp(Src) 98.6 F (37 C) (Oral)  Resp 20  SpO2 100% Physical Exam  Nursing note and vitals reviewed. Constitutional: He is oriented to person, place, and time. He appears well-developed and well-nourished. No distress.  HENT:  Head: Normocephalic and atraumatic.  Cardiovascular: Normal rate and regular rhythm.   Pulmonary/Chest: Effort normal and breath sounds normal.  Musculoskeletal:       Lumbar back: He exhibits tenderness, pain and spasm. He exhibits normal range of motion, no swelling, no edema, no deformity and no laceration.       Back:  Neurological: He is alert and oriented to person, place, and time. He has normal strength and normal reflexes. No sensory deficit. He exhibits normal muscle tone. Coordination and gait normal. GCS eye subscore is 4. GCS verbal subscore is 5. GCS motor subscore is 6.  Skin: Skin is warm and dry. No rash noted.    ED Course  Procedures (including critical care time) Patient has neurological or motor deficits noted on exam.  Patient is advised to use ice and heat on his lower back.  Return here as needed.  Followup with a primary care Dr. patient is advised to avoid heavy lifting.  Patient most likely has lumbar strain based on  the fact that his chronic pain, and working, doing lifting and landscape work  MDM    Carlyle Dolly, PA-C 02/18/13 1319

## 2013-02-18 NOTE — ED Provider Notes (Signed)
Medical screening examination/treatment/procedure(s) were performed by non-physician practitioner and as supervising physician I was immediately available for consultation/collaboration.  Jazzmon Prindle M Tityana Pagan, MD 02/18/13 1641 

## 2013-02-18 NOTE — ED Notes (Signed)
Pt reports L low back pain.  Pt reports hx of chronic back pain.  Pt ambulatory to the room.  Pt reports pain started x 1 week.  Was doing landscaping.

## 2013-02-18 NOTE — Progress Notes (Signed)
[facility] did not get to see pt but will be sending him information about the [facility], using the address provided. °

## 2014-10-11 ENCOUNTER — Emergency Department (HOSPITAL_COMMUNITY)
Admission: EM | Admit: 2014-10-11 | Discharge: 2014-10-11 | Disposition: A | Discharge status: Home / Self Care | Payer: Self-pay | Attending: Emergency Medicine | Admitting: Emergency Medicine

## 2014-10-11 ENCOUNTER — Encounter (HOSPITAL_COMMUNITY): Payer: Self-pay | Admitting: Emergency Medicine

## 2014-10-11 DIAGNOSIS — Z7952 Long term (current) use of systemic steroids: Secondary | ICD-10-CM | POA: Insufficient documentation

## 2014-10-11 DIAGNOSIS — Z720 Tobacco use: Secondary | ICD-10-CM | POA: Insufficient documentation

## 2014-10-11 DIAGNOSIS — N4830 Priapism, unspecified: Secondary | ICD-10-CM | POA: Insufficient documentation

## 2014-10-11 DIAGNOSIS — Z792 Long term (current) use of antibiotics: Secondary | ICD-10-CM | POA: Insufficient documentation

## 2014-10-11 DIAGNOSIS — R030 Elevated blood-pressure reading, without diagnosis of hypertension: Secondary | ICD-10-CM | POA: Insufficient documentation

## 2014-10-11 HISTORY — DX: Priapism, unspecified: N48.30

## 2014-10-11 LAB — CBC WITH DIFFERENTIAL/PLATELET
BASOS PCT: 0 % (ref 0–1)
Basophils Absolute: 0 10*3/uL (ref 0.0–0.1)
EOS ABS: 0.2 10*3/uL (ref 0.0–0.7)
Eosinophils Relative: 3 % (ref 0–5)
HEMATOCRIT: 40.3 % (ref 39.0–52.0)
HEMOGLOBIN: 13.6 g/dL (ref 13.0–17.0)
Lymphocytes Relative: 36 % (ref 12–46)
Lymphs Abs: 1.9 10*3/uL (ref 0.7–4.0)
MCH: 31.6 pg (ref 26.0–34.0)
MCHC: 33.7 g/dL (ref 30.0–36.0)
MCV: 93.5 fL (ref 78.0–100.0)
MONO ABS: 0.4 10*3/uL (ref 0.1–1.0)
MONOS PCT: 7 % (ref 3–12)
NEUTROS ABS: 2.8 10*3/uL (ref 1.7–7.7)
NEUTROS PCT: 54 % (ref 43–77)
PLATELETS: 236 10*3/uL (ref 150–400)
RBC: 4.31 MIL/uL (ref 4.22–5.81)
RDW: 12.4 % (ref 11.5–15.5)
WBC: 5.3 10*3/uL (ref 4.0–10.5)

## 2014-10-11 LAB — I-STAT CHEM 8, ED
BUN: 19 mg/dL (ref 6–23)
CHLORIDE: 105 mmol/L (ref 96–112)
CREATININE: 1 mg/dL (ref 0.50–1.35)
Calcium, Ion: 1.18 mmol/L (ref 1.12–1.23)
Glucose, Bld: 88 mg/dL (ref 70–99)
HEMATOCRIT: 44 % (ref 39.0–52.0)
HEMOGLOBIN: 15 g/dL (ref 13.0–17.0)
Potassium: 4 mmol/L (ref 3.5–5.1)
Sodium: 145 mmol/L (ref 135–145)
TCO2: 26 mmol/L (ref 0–100)

## 2014-10-11 MED ORDER — MORPHINE SULFATE 4 MG/ML IJ SOLN
4.0000 mg | Freq: Once | INTRAMUSCULAR | Status: AC
  Administered 2014-10-11: 4 mg via INTRAVENOUS
  Filled 2014-10-11: qty 1

## 2014-10-11 MED ORDER — PHENYLEPHRINE 200 MCG/ML FOR PRIAPISM / HYPOTENSION
INTRAMUSCULAR | Status: AC
  Filled 2014-10-11: qty 50

## 2014-10-11 MED ORDER — LISINOPRIL-HYDROCHLOROTHIAZIDE 10-12.5 MG PO TABS: 1.0000 | ORAL_TABLET | Freq: Every day | ORAL | Status: DC

## 2014-10-11 MED ORDER — LIDOCAINE HCL 1 % IJ SOLN
INTRAMUSCULAR | Status: AC
  Filled 2014-10-11: qty 20

## 2014-10-11 NOTE — ED Notes (Signed)
Dr Lenna Sciara informed of pt's medical condition

## 2014-10-11 NOTE — ED Notes (Signed)
Pt states hx of priapism. States that priapism started this morning when he woke up at 0430.

## 2014-10-11 NOTE — ED Provider Notes (Signed)
CSN: [id number]     Arrival date & time 10/11/14  1222 History   First MD Initiated Contact with Patient 10/11/14 1237     Chief Complaint  Patient presents with  . Priapism      (Consider location/radiation/quality/duration/timing/severity/associated sxs/prior Treatment) HPI C/o penile erection , constant and painful since 4 AM today. No other complaint. He denies taking any unusual medications or street drugs. No other associated symptoms. No treatment prior to coming here. Nothing makes symptoms better or worse. Past Medical History  Diagnosis Date  . Priapism    No past surgical history on file. No family history on file. History  Substance Use Topics  . Smoking status: Current Every Day Smoker -- 1.00 packs/day    Types: Cigarettes  . Smokeless tobacco: Never Used  . Alcohol Use: Yes    Review of Systems  Genitourinary:       Priapism  All other systems reviewed and are negative.     Allergies  Review of patient's allergies indicates no known allergies.  Home Medications   Prior to Admission medications   Medication Sig Start Date End Date Taking? Authorizing Provider  cephALEXin (KEFLEX) 500 MG capsule Take 1 capsule (500 mg total) by mouth 4 (four) times daily. Patient not taking: Reported on 10/11/2014 12/25/12   Kalman Drape, MD  cyclobenzaprine (FLEXERIL) 10 MG tablet Take 1 tablet (10 mg total) by mouth 3 (three) times daily as needed for muscle spasms. Patient not taking: Reported on 10/11/2014 02/18/13   Resa Miner Lawyer, PA-C  HYDROcodone-acetaminophen (NORCO/VICODIN) 5-325 MG per tablet Take 1 tablet by mouth every 6 (six) hours as needed for pain. Patient not taking: Reported on 10/11/2014 02/18/13   Resa Miner Lawyer, PA-C  predniSONE (DELTASONE) 50 MG tablet Take 1 tablet (50 mg total) by mouth daily. Patient not taking: Reported on 10/11/2014 02/18/13   Resa Miner Lawyer, PA-C   BP 180/122 mmHg  Pulse 87  Temp(Src) 98.9 F (37.2 C) (Oral)  Resp  18  SpO2 100% Physical Exam  Constitutional: He appears well-developed and well-nourished.  HENT:  Head: Normocephalic and atraumatic.  Eyes: Conjunctivae are normal. Pupils are equal, round, and reactive to light.  Neck: Neck supple. No tracheal deviation present. No thyromegaly present.  Cardiovascular: Normal rate and regular rhythm.   No murmur heard. Pulmonary/Chest: Effort normal and breath sounds normal.  Abdominal: Soft. Bowel sounds are normal. He exhibits no distension. There is no tenderness.  Genitourinary:  Penis erect  Musculoskeletal: Normal range of motion. He exhibits no edema or tenderness.  Neurological: He is alert. Coordination normal.  Skin: Skin is warm and dry. No rash noted.  Psychiatric: He has a normal mood and affect.  Nursing note and vitals reviewed.   ED Course  Procedures (including critical care time) Labs Review Labs Reviewed - No data to display  Imaging Review No results found.   EKG Interpretation None      Time out performed PRIAPISM TREATMENT (injection) Patient was prepped and draped in standard sterile fashion Just prior to procedure a timeout was performed rightcorpus cavernosum was entered with a 25-gauge syringe Blood was aspirated 4 cc of 200 mcg/ml phenylephrine was injected The patient tolerated the procedure No complications  2703 p.m. patient has achieved almost full Detumescence after treatment priapism treatment and treatment with intravenous morphine. Results for orders placed or performed during the hospital encounter of 10/11/14  CBC with Differential/Platelet  Result Value Ref Range   WBC 5.3 4.0 -  10.5 K/uL   RBC 4.31 4.22 - 5.81 MIL/uL   Hemoglobin 13.6 13.0 - 17.0 g/dL   HCT 40.3 39.0 - 52.0 %   MCV 93.5 78.0 - 100.0 fL   MCH 31.6 26.0 - 34.0 pg   MCHC 33.7 30.0 - 36.0 g/dL   RDW 12.4 11.5 - 15.5 %   Platelets 236 150 - 400 K/uL   Neutrophils Relative % 54 43 - 77 %   Neutro Abs 2.8 1.7 - 7.7 K/uL    Lymphocytes Relative 36 12 - 46 %   Lymphs Abs 1.9 0.7 - 4.0 K/uL   Monocytes Relative 7 3 - 12 %   Monocytes Absolute 0.4 0.1 - 1.0 K/uL   Eosinophils Relative 3 0 - 5 %   Eosinophils Absolute 0.2 0.0 - 0.7 K/uL   Basophils Relative 0 0 - 1 %   Basophils Absolute 0.0 0.0 - 0.1 K/uL  I-stat chem 8, ed  Result Value Ref Range   Sodium 145 135 - 145 mmol/L   Potassium 4.0 3.5 - 5.1 mmol/L   Chloride 105 96 - 112 mmol/L   BUN 19 6 - 23 mg/dL   Creatinine, Ser 1.00 0.50 - 1.35 mg/dL   Glucose, Bld 88 70 - 99 mg/dL   Calcium, Ion 1.18 1.12 - 1.23 mmol/L   TCO2 26 0 - 100 mmol/L   Hemoglobin 15.0 13.0 - 17.0 g/dL   HCT 44.0 39.0 - 52.0 %   No results found.  MDM  Patient has no known history of hypertension however has markedly elevated blood pressures here. Will write prescription for lisinopril-HCTZ, referral [facility]. I also spoke with Dr.Eskridge in light of patient's recurrent priapism he should have outpatient evaluation by urologist Diagnoses #1 priapism #2 elevated blood pressure Final diagnoses:  None        Orlie Dakin, MD 10/11/14 [phone]

## 2014-10-11 NOTE — Discharge Instructions (Signed)
Priapism Call [facility] in 2 days to schedule the next available appointment. Tell office staff that you were seen here. Start taking the blood pressure medication prescribed today or tomorrow. Your blood pressure was elevated today at 175/120. Your blood pressure should be rechecked in a week. Call [facility] in 2 days to arrange to get a primary care physician Priapism is a persistent, often painful erection. It is the hardening of the penis in males and of the clitoris in females, even without sexual stimulation. Priapism may come on suddenly. Priapism may last a short while, or may last a long time. Priapism occurs in all ages. The types of priapism include:  Acute prolonged priapism--Priapism that comes on suddenly and lasts.  Recurrent acute priapism--Priapism that comes on suddenly and tends to happen again.  Chronic priapism--Priapism that is persistent but with less of an erection. CAUSES  There are many causes. Causes include:  Blood problems common in people with the following diseases:  Sickle cell disease.  Leukemia.  Side effects of erectile dysfunction medicine. This is the most common cause of priapism.  Side effects of prescription medicine used in the treatment of depression and anxiety.  Illegal use of street drugs such as cocaine and marijuana.  Excessive use of alcohol.  Neurological problems such as multiple sclerosis.  Diabetes mellitus.  The cause may be unknown. SIGNS AND SYMPTOMS  A prolonged erection, usually without sexual stimulation or following the use of erectile dysfunction medicine.  A painful erection. DIAGNOSIS Diagnosis of priapism can usually be confirmed by your health care provider after a physical exam. Your health care provider may have blood tests done to search for a potential cause, such as leukemia or sickle cell disease. TREATMENT  Treatments depend on the cause. Some specific treatments  include:  Oxygen and red blood cell transfusions in patients with sickle cell disease.  A special treatment for plasma in those with leukemia.  Removing blood that is trapped.  Treatment with medicine.  Surgical shunting (a passage that is made to allow blood to flow from one part of the body to another). HOME CARE INFORMATION  Avoid sexual stimulation and intercourse until your health care provider says it is okay.  Avoid the use of alcohol or drugs to minimize recurrence of priapism. SEEK MEDICAL CARE IF:  You experience worsening pain instead of improvement. SEEK IMMEDIATE MEDICAL CARE IF:  You experience fever or shaking chills.  You experience pain, swelling, or redness in your genital or groin area. MAKE SURE YOU:  Understand these instructions.   Will watch your condition.  Will get help right away if you are not doing well or get worse. Document Released: 10/22/2003 Document Revised: 05/22/2013 Document Reviewed: 01/03/2013 [facility] Patient Information 2015 [place], [place]. This information is not intended to replace advice given to you by your health care provider. Make sure you discuss any questions you have with your health care provider.

## 2014-10-23 ENCOUNTER — Ambulatory Visit: Payer: Self-pay | Attending: Family Medicine | Admitting: Physician Assistant

## 2014-10-23 ENCOUNTER — Encounter: Payer: Self-pay | Admitting: Physician Assistant

## 2014-10-23 VITALS — BP 160/113 | HR 90 | Temp 98.3°F | Resp 16 | Ht 66.0 in | Wt 163.0 lb

## 2014-10-23 DIAGNOSIS — R030 Elevated blood-pressure reading, without diagnosis of hypertension: Secondary | ICD-10-CM

## 2014-10-23 DIAGNOSIS — Z9114 Patient's other noncompliance with medication regimen: Secondary | ICD-10-CM | POA: Insufficient documentation

## 2014-10-23 DIAGNOSIS — IMO0001 Reserved for inherently not codable concepts without codable children: Secondary | ICD-10-CM

## 2014-10-23 DIAGNOSIS — N4830 Priapism, unspecified: Secondary | ICD-10-CM | POA: Insufficient documentation

## 2014-10-23 DIAGNOSIS — I10 Essential (primary) hypertension: Secondary | ICD-10-CM | POA: Insufficient documentation

## 2014-10-23 MED ORDER — LISINOPRIL-HYDROCHLOROTHIAZIDE 10-12.5 MG PO TABS: 1.0000 | ORAL_TABLET | Freq: Every day | ORAL | Status: DC

## 2014-10-23 MED ORDER — CLONIDINE HCL 0.1 MG PO TABS
0.1000 mg | ORAL_TABLET | Freq: Once | ORAL | Status: AC
  Administered 2014-10-23: 0.1 mg via ORAL

## 2014-10-23 NOTE — Progress Notes (Signed)
Patient here to establish care and f/u after recent ED visit  For priapism and HTN. Patient reports he has been having headaches and lost his medicine for 4 days.  He found his medicine last night and took it today.

## 2014-10-23 NOTE — Progress Notes (Signed)
Erik Nguyen  ZOX:[id number]  [id number]:[id number]  DOB - [birth year]  Chief Complaint  Patient presents with  . Establish Care  . Hospitalization Follow-up       Subjective:   Erik Nguyen is a 59 y.o. male here today for establishment of care. He was in the emergency department on 10/11/2014 for a sustained penile erection. Possibly one month ago he had the same thing happened again 2 years ago. This is the third time this has happened in the last couple years. He has been seen by urology in the past but hasn't been ill. His blood pressures being elevated. His been noncompliant with this drug regimen. Labs at the emergency department were okay. He received some cocktail and his erection has improved. No recurrence since discharge.    ROS: GEN: denies fever or chills, denies change in weight Skin: denies lesions or rashes LUNGS: denies SHOB, dyspnea, PND, orthopnea CV: denies CP or SHOB NEURO: denies numbness or tingling, denies sz, stroke or TIA   ALLERGIES: No Known Allergies  PAST MEDICAL HISTORY: Past Medical History  Diagnosis Date  . Priapism   . Hypertension     PAST SURGICAL HISTORY: History reviewed. No pertinent past surgical history.  MEDICATIONS AT HOME: Prior to Admission medications   Medication Sig Start Date End Date Taking? Authorizing Provider  HYDROcodone-acetaminophen (NORCO/VICODIN) 5-325 MG per tablet Take 1 tablet by mouth every 6 (six) hours as needed for pain. 02/18/13  Yes Erik Lawyer, PA-C  lisinopril-hydrochlorothiazide (PRINZIDE) 10-12.5 MG per tablet Take 1 tablet by mouth daily. 10/23/14  Yes Erik Shrestha Daneil Dan, PA-C  cephALEXin (KEFLEX) 500 MG capsule Take 1 capsule (500 mg total) by mouth 4 (four) times daily. Patient not taking: Reported on 10/11/2014 12/25/12   Erik Flemings, MD  cyclobenzaprine (FLEXERIL) 10 MG tablet Take 1 tablet (10 mg total) by mouth 3 (three) times daily as needed for muscle spasms. Patient not taking: Reported on  10/11/2014 02/18/13   Erik Heading, PA-C  predniSONE (DELTASONE) 50 MG tablet Take 1 tablet (50 mg total) by mouth daily. Patient not taking: Reported on 10/11/2014 02/18/13   Erik Heading, PA-C     Objective:   Filed Vitals:   10/23/14 1439 10/23/14 1507  BP: 163/114 160/113  Pulse: 90   Temp: 98.3 F (36.8 C)   Resp: 16   Height: 5\' 6"  (1.676 m)   Weight: 163 lb (73.936 kg)   SpO2: 97%     Exam General appearance : Awake, alert, not in any distress. Speech Clear. Not toxic looking Neck: supple, no JVD. No cervical lymphadenopathy.  Chest:Good air entry bilaterally, no added sounds  CVS: S1 S2 regular, no murmurs.  Neurology: Awake alert, and oriented X 3, CN II-XII intact, Non focal   Assessment & Plan  1. Priaprism-recurrent  -BP control  -Urology referral 2. HTN-not controlled/noncompliant  -refilled meds   -Clonidine prn today  -low salt diet, increase exercise, keep log  -Risk Factor Modification   Return in about 4 weeks (around 11/20/2014).  The patient was given clear instructions to go to ER or return to medical center if symptoms don't improve, worsen or new problems develop. The patient verbalized understanding. The patient was told to call to get lab results if they haven't heard anything in the next week.   This note has been created with Surveyor, quantity. Any transcriptional errors are unintentional.    Erik Nguyen and [facility]  [place], [place]   10/23/2014, 4:11 PM

## 2014-10-23 NOTE — Patient Instructions (Signed)
Take your BP medication every day around the same time of day Get more exercise

## 2014-11-06 ENCOUNTER — Encounter: Payer: Self-pay | Admitting: Family Medicine

## 2014-11-06 ENCOUNTER — Ambulatory Visit: Payer: Self-pay | Attending: Family Medicine | Admitting: Family Medicine

## 2014-11-06 VITALS — BP 120/81 | HR 70 | Temp 98.7°F | Resp 18 | Ht 66.0 in | Wt 161.0 lb

## 2014-11-06 DIAGNOSIS — K649 Unspecified hemorrhoids: Secondary | ICD-10-CM | POA: Insufficient documentation

## 2014-11-06 DIAGNOSIS — Z114 Encounter for screening for human immunodeficiency virus [HIV]: Secondary | ICD-10-CM

## 2014-11-06 DIAGNOSIS — Z8042 Family history of malignant neoplasm of prostate: Secondary | ICD-10-CM

## 2014-11-06 DIAGNOSIS — I10 Essential (primary) hypertension: Secondary | ICD-10-CM

## 2014-11-06 DIAGNOSIS — Z1211 Encounter for screening for malignant neoplasm of colon: Secondary | ICD-10-CM | POA: Insufficient documentation

## 2014-11-06 DIAGNOSIS — N4830 Priapism, unspecified: Secondary | ICD-10-CM

## 2014-11-06 DIAGNOSIS — K642 Third degree hemorrhoids: Secondary | ICD-10-CM

## 2014-11-06 DIAGNOSIS — Z800 Family history of malignant neoplasm of digestive organs: Secondary | ICD-10-CM

## 2014-11-06 DIAGNOSIS — Z87891 Personal history of nicotine dependence: Secondary | ICD-10-CM | POA: Insufficient documentation

## 2014-11-06 DIAGNOSIS — H547 Unspecified visual loss: Secondary | ICD-10-CM

## 2014-11-06 LAB — HEMOCCULT GUIAC POC 1CARD (OFFICE): Fecal Occult Blood, POC: NEGATIVE

## 2014-11-06 MED ORDER — LISINOPRIL-HYDROCHLOROTHIAZIDE 10-12.5 MG PO TABS: 1.0000 | ORAL_TABLET | Freq: Every day | ORAL | Status: DC

## 2014-11-06 NOTE — Assessment & Plan Note (Addendum)
Colon cancer screening: GI referral

## 2014-11-06 NOTE — Assessment & Plan Note (Signed)
Priapism: urology referral

## 2014-11-06 NOTE — Patient Instructions (Signed)
Erik Nguyen,  1. HTN: BP at goal Continue prinzide Low salt diet Regular exercise   2. Family hx of prostate cancer: PSA today  3. Poor vision: optometry referral  4. Priapism: urology referral   5. Colon cancer screening: GI referral  6. HIV screening: HIV test  F/u in 6 weeks with RN for BP check  F/u in 3 months for HTN  Dr. Adrian Blackwater   DASH Eating Plan DASH stands for "Dietary Approaches to Stop Hypertension." The DASH eating plan is a healthy eating plan that has been shown to reduce high blood pressure (hypertension). Additional health benefits may include reducing the risk of type 2 diabetes mellitus, heart disease, and stroke. The DASH eating plan may also help with weight loss. WHAT DO I NEED TO KNOW ABOUT THE DASH EATING PLAN? For the DASH eating plan, you will follow these general guidelines:  Choose foods with a percent daily value for sodium of less than 5% (as listed on the food label).  Use salt-free seasonings or herbs instead of table salt or sea salt.  Check with your health care provider or pharmacist before using salt substitutes.  Eat lower-sodium products, often labeled as "lower sodium" or "no salt added."  Eat fresh foods.  Eat more vegetables, fruits, and low-fat dairy products.  Choose whole grains. Look for the word "whole" as the first word in the ingredient list.  Choose fish and skinless chicken or [place] more often than red meat. Limit fish, poultry, and meat to 6 oz (170 g) each day.  Limit sweets, desserts, sugars, and sugary drinks.  Choose heart-healthy fats.  Limit cheese to 1 oz (28 g) per day.  Eat more home-cooked food and less restaurant, buffet, and fast food.  Limit fried foods.  Cook foods using methods other than frying.  Limit canned vegetables. If you do use them, rinse them well to decrease the sodium.  When eating at a restaurant, ask that your food be prepared with less salt, or no salt if possible. WHAT FOODS CAN  I EAT? Seek help from a dietitian for individual calorie needs. Grains Whole grain or whole wheat bread. Brown rice. Whole grain or whole wheat pasta. Quinoa, bulgur, and whole grain cereals. Low-sodium cereals. Corn or whole wheat flour tortillas. Whole grain cornbread. Whole grain crackers. Low-sodium crackers. Vegetables Fresh or frozen vegetables (raw, steamed, roasted, or grilled). Low-sodium or reduced-sodium tomato and vegetable juices. Low-sodium or reduced-sodium tomato sauce and paste. Low-sodium or reduced-sodium canned vegetables.  Fruits All fresh, canned (in natural juice), or frozen fruits. Meat and Other Protein Products Ground beef (85% or leaner), grass-fed beef, or beef trimmed of fat. Skinless chicken or [place]. Ground chicken or [place]. Pork trimmed of fat. All fish and seafood. Eggs. Dried beans, peas, or lentils. Unsalted nuts and seeds. Unsalted canned beans. Dairy Low-fat dairy products, such as skim or 1% milk, 2% or reduced-fat cheeses, low-fat ricotta or cottage cheese, or plain low-fat yogurt. Low-sodium or reduced-sodium cheeses. Fats and Oils Tub margarines without trans fats. Light or reduced-fat mayonnaise and salad dressings (reduced sodium). Avocado. Safflower, olive, or canola oils. Natural peanut or almond butter. Other Unsalted popcorn and pretzels. The items listed above may not be a complete list of recommended foods or beverages. Contact your dietitian for more options. WHAT FOODS ARE NOT RECOMMENDED? Grains White bread. White pasta. White rice. Refined cornbread. Bagels and croissants. Crackers that contain trans fat. Vegetables Creamed or fried vegetables. Vegetables in a cheese sauce. Regular canned  vegetables. Regular canned tomato sauce and paste. Regular tomato and vegetable juices. Fruits Dried fruits. Canned fruit in light or heavy syrup. Fruit juice. Meat and Other Protein Products Fatty cuts of meat. Ribs, chicken wings, bacon, sausage,  bologna, salami, chitterlings, fatback, hot dogs, bratwurst, and packaged luncheon meats. Salted nuts and seeds. Canned beans with salt. Dairy Whole or 2% milk, cream, half-and-half, and cream cheese. Whole-fat or sweetened yogurt. Full-fat cheeses or blue cheese. Nondairy creamers and whipped toppings. Processed cheese, cheese spreads, or cheese curds. Condiments Onion and garlic salt, seasoned salt, table salt, and sea salt. Canned and packaged gravies. Worcestershire sauce. Tartar sauce. Barbecue sauce. Teriyaki sauce. Soy sauce, including reduced sodium. Steak sauce. Fish sauce. Oyster sauce. Cocktail sauce. Horseradish. Ketchup and mustard. Meat flavorings and tenderizers. Bouillon cubes. Hot sauce. Tabasco sauce. Marinades. Taco seasonings. Relishes. Fats and Oils Butter, stick margarine, lard, shortening, ghee, and bacon fat. Coconut, palm kernel, or palm oils. Regular salad dressings. Other Pickles and olives. Salted popcorn and pretzels. The items listed above may not be a complete list of foods and beverages to avoid. Contact your dietitian for more information. WHERE CAN I FIND MORE INFORMATION? National Heart, Lung, and Blood Institute: travelstabloid.com Document Released: 07/21/2011 Document Revised: 12/16/2013 Document Reviewed: 06/05/2013 [facility] Patient Information 2015 [place], [place]. This information is not intended to replace advice given to you by your health care provider. Make sure you discuss any questions you have with your health care provider.

## 2014-11-06 NOTE — Assessment & Plan Note (Addendum)
Family hx of prostate cancer: PSA today. Normal exam.

## 2014-11-06 NOTE — Assessment & Plan Note (Signed)
HIV screening: HIV test

## 2014-11-06 NOTE — Assessment & Plan Note (Signed)
A: 3rd degree hemorrhoid, FOBT negative currently P: GI referral for colonoscopy

## 2014-11-06 NOTE — Progress Notes (Signed)
   Subjective:    Patient ID: Erik Nguyen, male    DOB: 1956/07/18, 59 y.o.   MRN: [id number] CC: f/u HTN, urology referral  HPI  1. CHRONIC HYPERTENSION  Disease Monitoring  Blood pressure range: 133/92, 142/89, 153/102, 155/108   Chest pain: no   Dyspnea: no   Claudication: no   Medication compliance: yes  Medication Side Effects  Lightheadedness: yes   Urinary frequency: no   Edema: no   Impotence: no   Preventitive Healthcare:  Exercise: yes, Y once weekly. Walk around track. Work at [employer]. Constantly moving.   Diet Pattern: 3 meals per day   Salt Restriction: yes   2. Former smoker: quit in 2015. Smoked for 30 yrs prior.   3. Priapism: 3-4 episodes in past 2 years. First episode after taking trazodone. All others spontaneous. Had to have cavernous aspiration twice. No known hx of [place] trait. No family hx of sickle cell disease or trait. No drugs. No viagra/cialsis.   4. Rectal bleeding: comes and goes. With hard stools. Has hemorrhoids. No fam hx of colon cancer.   5. Fam hx of prostate cancer: in father.   Soc Hx: current smoker  Review of Systems As per HPI     Objective:   Physical Exam BP 120/81 mmHg  Pulse 70  Temp(Src) 98.7 F (37.1 C) (Oral)  Resp 18  Ht 5\' 6"  (1.676 m)  Wt 161 lb (73.029 kg)  BMI 26.00 kg/m2  SpO2 97% General appearance: alert, cooperative and no distress Eyes: conjunctivae/corneas clear. PERRL, EOM's intact. Fundi benign. Lungs: clear to auscultation bilaterally Heart: regular rate and rhythm, S1, S2 normal, no murmur, click, rub or gallop Male genitalia: normal, abnormal findings: penis is moderately erect  Rectal: normal tone, normal prostate, no masses or tenderness and soft brown guaiac negative stool noted Extremities: extremities normal, atraumatic, no cyanosis or edema       Assessment & Plan:

## 2014-11-06 NOTE — Progress Notes (Signed)
Establish Care F/U HTN  Tobacco last used Sep 20, 2014

## 2014-11-06 NOTE — Assessment & Plan Note (Addendum)
HTN: BP at goal Continue prinzide, refills sent to onsite pharmacy  Low salt diet Regular exercise

## 2014-11-06 NOTE — Assessment & Plan Note (Signed)
Poor vision: optometry referral

## 2014-11-07 LAB — HIV ANTIBODY (ROUTINE TESTING W REFLEX): HIV 1&2 Ab, 4th Generation: NONREACTIVE

## 2014-11-07 LAB — PSA: PSA: 1.58 ng/mL (ref <=4.00)

## 2014-11-07 LAB — SICKLE CELL SCREEN: Sickle Cell Screen: NEGATIVE

## 2014-11-19 ENCOUNTER — Telehealth: Payer: Self-pay | Admitting: *Deleted

## 2014-11-19 NOTE — Telephone Encounter (Signed)
-----   Message from Boykin Nearing, MD sent at 11/07/2014  9:04 AM EDT ----- Screening HIV negative. PSA normal. Sickle cell screen negative

## 2014-11-19 NOTE — Telephone Encounter (Signed)
Left voice message with normal labs If any question return call

## 2014-11-20 ENCOUNTER — Ambulatory Visit: Payer: Self-pay | Attending: Family Medicine

## 2014-12-05 ENCOUNTER — Encounter: Payer: Self-pay | Admitting: Gastroenterology

## 2014-12-12 ENCOUNTER — Ambulatory Visit (AMBULATORY_SURGERY_CENTER): Payer: Self-pay | Admitting: *Deleted

## 2014-12-12 VITALS — Ht 66.0 in | Wt 160.0 lb

## 2014-12-12 DIAGNOSIS — Z86010 Personal history of colonic polyps: Secondary | ICD-10-CM

## 2014-12-12 MED ORDER — NA SULFATE-K SULFATE-MG SULF 17.5-3.13-1.6 GM/177ML PO SOLN: 1.0000 | Freq: Once | ORAL | Status: DC

## 2014-12-12 NOTE — Progress Notes (Signed)
No egg or soy allergy No issues with past sedation No home 02 use No diet pills emmi video declined

## 2014-12-26 ENCOUNTER — Encounter: Payer: Self-pay | Admitting: Gastroenterology

## 2014-12-26 ENCOUNTER — Other Ambulatory Visit: Payer: Self-pay | Admitting: Gastroenterology

## 2014-12-26 ENCOUNTER — Ambulatory Visit (AMBULATORY_SURGERY_CENTER): Payer: Self-pay | Admitting: Gastroenterology

## 2014-12-26 VITALS — BP 118/84 | HR 62 | Temp 96.4°F | Resp 16 | Ht 66.0 in | Wt 160.0 lb

## 2014-12-26 DIAGNOSIS — K648 Other hemorrhoids: Secondary | ICD-10-CM

## 2014-12-26 DIAGNOSIS — Z86010 Personal history of colonic polyps: Secondary | ICD-10-CM

## 2014-12-26 DIAGNOSIS — K5730 Diverticulosis of large intestine without perforation or abscess without bleeding: Secondary | ICD-10-CM

## 2014-12-26 DIAGNOSIS — D120 Benign neoplasm of cecum: Secondary | ICD-10-CM

## 2014-12-26 MED ORDER — SODIUM CHLORIDE 0.9 % IV SOLN: 500.0000 mL | INTRAVENOUS | Status: DC

## 2014-12-26 NOTE — Progress Notes (Signed)
Pt c/o "hot feeling" to IV site after removed.  I checked the site- no redness, heat or edema noted.  Entire area around site- no redness, edema or heat noted.

## 2014-12-26 NOTE — Progress Notes (Signed)
Called to room to assist during endoscopic procedure.  Patient ID and intended procedure confirmed with present staff. Received instructions for my participation in the procedure from the performing physician.  

## 2014-12-26 NOTE — Patient Instructions (Signed)
YOU HAD AN ENDOSCOPIC PROCEDURE TODAY AT [facility]:   Refer to the procedure report that was given to you for any specific questions about what was found during the examination.  If the procedure report does not answer your questions, please call your gastroenterologist to clarify.  If you requested that your care partner not be given the details of your procedure findings, then the procedure report has been included in a sealed envelope for you to review at your convenience later.  YOU SHOULD EXPECT: Some feelings of bloating in the abdomen. Passage of more gas than usual.  Walking can help get rid of the air that was put into your GI tract during the procedure and reduce the bloating. If you had a lower endoscopy (such as a colonoscopy or flexible sigmoidoscopy) you may notice spotting of blood in your stool or on the toilet paper. If you underwent a bowel prep for your procedure, you may not have a normal bowel movement for a few days.  Please Note:  You might notice some irritation and congestion in your nose or some drainage.  This is from the oxygen used during your procedure.  There is no need for concern and it should clear up in a day or so.  SYMPTOMS TO REPORT IMMEDIATELY:   Following lower endoscopy (colonoscopy or flexible sigmoidoscopy):  Excessive amounts of blood in the stool  Significant tenderness or worsening of abdominal pains  Swelling of the abdomen that is new, acute  Fever of 100F or higher  For urgent or emergent issues, a gastroenterologist can be reached at any hour by calling [phone].  DIET: Your first meal following the procedure should be a small meal and then it is ok to progress to your normal diet. Heavy or fried foods are harder to digest and may make you feel nauseous or bloated.  Likewise, meals heavy in dairy and vegetables can increase bloating.  Drink plenty of fluids but you should avoid alcoholic beverages for 24 hours.  ACTIVITY:   You should plan to take it easy for the rest of today and you should NOT DRIVE or use heavy machinery until tomorrow (because of the sedation medicines used during the test).    FOLLOW UP: Our staff will call the number listed on your records the next business day following your procedure to check on you and address any questions or concerns that you may have regarding the information given to you following your procedure. If we do not reach you, we will leave a message.  However, if you are feeling well and you are not experiencing any problems, there is no need to return our call.  We will assume that you have returned to your regular daily activities without incident.  If any biopsies were taken you will be contacted by phone or by letter within the next 1-3 weeks.  Please call us at [phone] if you have not heard about the biopsies in 3 weeks.   SIGNATURES/CONFIDENTIALITY: You and/or your care partner have signed paperwork which will be entered into your electronic medical record.  These signatures attest to the fact that that the information above on your After Visit Summary has been reviewed and is understood.  Full responsibility of the confidentiality of this discharge information lies with you and/or your care-partner.  Please continue your normal medications  Please read over handouts about polyps, diverticulosis, high fiber diets, and hemorrhoids

## 2014-12-26 NOTE — Op Note (Signed)
[place]  [address] [place], [id number]   COLONOSCOPY PROCEDURE REPORT  PATIENT: Erik Nguyen, Erik Nguyen  MR#: [id number] BIRTHDATE: 1956/06/29 , 34  yrs. old GENDER: male ENDOSCOPIST: Inda Castle, MD REFERRED UO:[id number] Funches, MD PROCEDURE DATE:  12/26/2014 PROCEDURE:   Colonoscopy, surveillance , Colonoscopy with cold biopsy polypectomy, and Colonoscopy with snare polypectomy First Screening Colonoscopy - Avg.  risk and is 50 yrs.  old or older - No.  Prior Negative Screening - Now for repeat screening. N/A  History of Adenoma - Now for follow-up colonoscopy & has been > or = to 3 yrs.  Yes hx of adenoma.  Has been 3 or more years since last colonoscopy.  Polyps removed today? Yes ASA CLASS:   Class II INDICATIONS:PH Colon Adenoma. MEDICATIONS: Monitored anesthesia care and Propofol 200 mg IV  DESCRIPTION OF PROCEDURE:   After the risks benefits and alternatives of the procedure were thoroughly explained, informed consent was obtained.  The digital rectal exam revealed no abnormalities of the rectum.   The [id number]  endoscope was introduced through the anus and advanced to the cecum, which was identified by both the appendix and ileocecal valve. No adverse events experienced.   The quality of the prep was (Suprep was used) good.  The instrument was then slowly withdrawn as the colon was fully examined.      COLON FINDINGS: There was mild diverticulosis noted in the ascending colon.   Internal hemorrhoids were found.   Two sessile polyps ranging from 2 to 31mm in size were found at the cecum.  A polypectomy was performed with a cold snare.  The resection was complete, the polyp tissue was completely retrieved and sent to histology.  A polypectomy was performed with cold forceps. Retroflexed views revealed no abnormalities. The time to cecum = 3.3 Withdrawal time = 9.8   The scope was withdrawn and the procedure completed. COMPLICATIONS:  There were no immediate complications.  ENDOSCOPIC IMPRESSION: 1.   Mild diverticulosis was noted in the ascending colon 2.   Internal hemorrhoids 3.   Two sessile polyps ranging from 2 to 47mm in size were found at the cecum; polypectomy was performed with a cold snare; polypectomy was performed with cold forceps  RECOMMENDATIONS: If the polyp(s) removed today are proven to be adenomatous (pre-cancerous) polyps, you will need a repeat colonoscopy in 5 years.  Otherwise you should continue to follow colorectal cancer screening guidelines for "routine risk" patients with colonoscopy in 10 years.  You will receive a letter within 1-2 weeks with the results of your biopsy as well as final recommendations.  Please call my office if you have not received a letter after 3 weeks.  eSigned:  Inda Castle, MD 12/26/2014 8:04 AM   cc:   PATIENT NAME:  Erik Nguyen, Erik Nguyen MR#: [id number]

## 2014-12-26 NOTE — Progress Notes (Signed)
Report to PACU, RN, vss, BBS= Clear.  

## 2014-12-28 ENCOUNTER — Emergency Department (HOSPITAL_COMMUNITY): Payer: Self-pay

## 2014-12-28 ENCOUNTER — Inpatient Hospital Stay (HOSPITAL_COMMUNITY)
Admission: EM | Admit: 2014-12-28 | Discharge: 2015-01-01 | DRG: 379 | Disposition: A | Discharge status: Home / Self Care | Payer: Self-pay | Attending: Internal Medicine | Admitting: Internal Medicine

## 2014-12-28 ENCOUNTER — Encounter (HOSPITAL_COMMUNITY): Payer: Self-pay

## 2014-12-28 DIAGNOSIS — R1030 Lower abdominal pain, unspecified: Secondary | ICD-10-CM | POA: Diagnosis present

## 2014-12-28 DIAGNOSIS — K209 Esophagitis, unspecified without bleeding: Secondary | ICD-10-CM | POA: Diagnosis present

## 2014-12-28 DIAGNOSIS — K921 Melena: Secondary | ICD-10-CM | POA: Insufficient documentation

## 2014-12-28 DIAGNOSIS — K648 Other hemorrhoids: Secondary | ICD-10-CM | POA: Diagnosis present

## 2014-12-28 DIAGNOSIS — E876 Hypokalemia: Secondary | ICD-10-CM | POA: Diagnosis present

## 2014-12-28 DIAGNOSIS — R109 Unspecified abdominal pain: Secondary | ICD-10-CM | POA: Insufficient documentation

## 2014-12-28 DIAGNOSIS — Z91040 Latex allergy status: Secondary | ICD-10-CM

## 2014-12-28 DIAGNOSIS — R1084 Generalized abdominal pain: Secondary | ICD-10-CM | POA: Insufficient documentation

## 2014-12-28 DIAGNOSIS — Z87891 Personal history of nicotine dependence: Secondary | ICD-10-CM

## 2014-12-28 DIAGNOSIS — R935 Abnormal findings on diagnostic imaging of other abdominal regions, including retroperitoneum: Secondary | ICD-10-CM | POA: Insufficient documentation

## 2014-12-28 DIAGNOSIS — K922 Gastrointestinal hemorrhage, unspecified: Principal | ICD-10-CM | POA: Diagnosis present

## 2014-12-28 DIAGNOSIS — K529 Noninfective gastroenteritis and colitis, unspecified: Secondary | ICD-10-CM | POA: Diagnosis present

## 2014-12-28 DIAGNOSIS — I10 Essential (primary) hypertension: Secondary | ICD-10-CM | POA: Diagnosis present

## 2014-12-28 LAB — CBC WITH DIFFERENTIAL/PLATELET
Basophils Absolute: 0 10*3/uL (ref 0.0–0.1)
Basophils Relative: 0 % (ref 0–1)
EOS ABS: 0.1 10*3/uL (ref 0.0–0.7)
Eosinophils Relative: 2 % (ref 0–5)
HCT: 38.8 % — ABNORMAL LOW (ref 39.0–52.0)
Hemoglobin: 13.9 g/dL (ref 13.0–17.0)
LYMPHS PCT: 8 % — AB (ref 12–46)
Lymphs Abs: 0.6 10*3/uL — ABNORMAL LOW (ref 0.7–4.0)
MCH: 32 pg (ref 26.0–34.0)
MCHC: 35.8 g/dL (ref 30.0–36.0)
MCV: 89.4 fL (ref 78.0–100.0)
Monocytes Absolute: 0.4 10*3/uL (ref 0.1–1.0)
Monocytes Relative: 5 % (ref 3–12)
Neutro Abs: 5.5 10*3/uL (ref 1.7–7.7)
Neutrophils Relative %: 85 % — ABNORMAL HIGH (ref 43–77)
PLATELETS: 224 10*3/uL (ref 150–400)
RBC: 4.34 MIL/uL (ref 4.22–5.81)
RDW: 11.9 % (ref 11.5–15.5)
WBC: 6.5 10*3/uL (ref 4.0–10.5)

## 2014-12-28 LAB — COMPREHENSIVE METABOLIC PANEL
ALK PHOS: 53 U/L (ref 38–126)
ALT: 17 U/L (ref 17–63)
ANION GAP: 12 (ref 5–15)
AST: 24 U/L (ref 15–41)
Albumin: 4.2 g/dL (ref 3.5–5.0)
BUN: 15 mg/dL (ref 6–20)
CHLORIDE: 101 mmol/L (ref 101–111)
CO2: 27 mmol/L (ref 22–32)
CREATININE: 1.21 mg/dL (ref 0.61–1.24)
Calcium: 9.1 mg/dL (ref 8.9–10.3)
GFR calc Af Amer: >60 mL/min (ref >60)
GFR calc non Af Amer: >60 mL/min (ref >60)
GLUCOSE: 133 mg/dL — AB (ref 65–99)
Potassium: 3.4 mmol/L — ABNORMAL LOW (ref 3.5–5.1)
SODIUM: 140 mmol/L (ref 135–145)
Total Bilirubin: 0.5 mg/dL (ref 0.3–1.2)
Total Protein: 6.9 g/dL (ref 6.5–8.1)

## 2014-12-28 LAB — URINALYSIS, ROUTINE W REFLEX MICROSCOPIC
Bilirubin Urine: NEGATIVE
Glucose, UA: NEGATIVE mg/dL
Hgb urine dipstick: NEGATIVE
Ketones, ur: NEGATIVE mg/dL
LEUKOCYTES UA: NEGATIVE
Nitrite: NEGATIVE
Protein, ur: NEGATIVE mg/dL
Specific Gravity, Urine: 1.024 (ref 1.005–1.030)
Urobilinogen, UA: 1 mg/dL (ref 0.0–1.0)
pH: 7 (ref 5.0–8.0)

## 2014-12-28 LAB — I-STAT CG4 LACTIC ACID, ED: Lactic Acid, Venous: 1.82 mmol/L (ref 0.5–2.0)

## 2014-12-28 LAB — LIPASE, BLOOD: Lipase: 20 U/L — ABNORMAL LOW (ref 22–51)

## 2014-12-28 LAB — TYPE AND SCREEN
ABO/RH(D): A POS
Antibody Screen: NEGATIVE

## 2014-12-28 LAB — ABO/RH: ABO/RH(D): A POS

## 2014-12-28 MED ORDER — ACETAMINOPHEN 650 MG RE SUPP: 650.0000 mg | Freq: Four times a day (QID) | RECTAL | Status: DC | PRN

## 2014-12-28 MED ORDER — SODIUM CHLORIDE 0.9 % IV SOLN
INTRAVENOUS | Status: AC
  Administered 2014-12-28: via INTRAVENOUS

## 2014-12-28 MED ORDER — METRONIDAZOLE IN NACL 5-0.79 MG/ML-% IV SOLN
500.0000 mg | Freq: Three times a day (TID) | INTRAVENOUS | Status: DC
  Administered 2014-12-29 – 2014-12-31 (×9): 500 mg via INTRAVENOUS
  Filled 2014-12-28 (×12): qty 100

## 2014-12-28 MED ORDER — ONDANSETRON HCL 4 MG/2ML IJ SOLN: 4.0000 mg | Freq: Four times a day (QID) | INTRAMUSCULAR | Status: DC | PRN

## 2014-12-28 MED ORDER — PANTOPRAZOLE SODIUM 40 MG IV SOLR
40.0000 mg | Freq: Two times a day (BID) | INTRAVENOUS | Status: DC
  Administered 2014-12-28 – 2015-01-01 (×8): 40 mg via INTRAVENOUS
  Filled 2014-12-28 (×10): qty 40

## 2014-12-28 MED ORDER — ALUM & MAG HYDROXIDE-SIMETH 200-200-20 MG/5ML PO SUSP: 30.0000 mL | Freq: Four times a day (QID) | ORAL | Status: DC | PRN

## 2014-12-28 MED ORDER — CIPROFLOXACIN IN D5W 400 MG/200ML IV SOLN
400.0000 mg | Freq: Two times a day (BID) | INTRAVENOUS | Status: DC
  Administered 2014-12-29 – 2014-12-31 (×6): 400 mg via INTRAVENOUS
  Filled 2014-12-28 (×8): qty 200

## 2014-12-28 MED ORDER — HYDROMORPHONE HCL 1 MG/ML IJ SOLN
0.5000 mg | INTRAMUSCULAR | Status: DC | PRN
  Administered 2014-12-29 – 2014-12-30 (×8): 1 mg via INTRAVENOUS
  Filled 2014-12-28 (×8): qty 1

## 2014-12-28 MED ORDER — ACETAMINOPHEN 325 MG PO TABS
650.0000 mg | ORAL_TABLET | Freq: Four times a day (QID) | ORAL | Status: DC | PRN
  Administered 2014-12-30 (×2): 650 mg via ORAL
  Filled 2014-12-28 (×2): qty 2

## 2014-12-28 MED ORDER — IOHEXOL 300 MG/ML  SOLN
25.0000 mL | Freq: Once | INTRAMUSCULAR | Status: AC | PRN
  Administered 2014-12-28: 25 mL via ORAL

## 2014-12-28 MED ORDER — SODIUM CHLORIDE 0.9 % IJ SOLN
3.0000 mL | Freq: Two times a day (BID) | INTRAMUSCULAR | Status: DC
  Administered 2014-12-29: 3 mL via INTRAVENOUS

## 2014-12-28 MED ORDER — OXYCODONE HCL 5 MG PO TABS: 5.0000 mg | ORAL_TABLET | ORAL | Status: DC | PRN

## 2014-12-28 MED ORDER — HYDRALAZINE HCL 20 MG/ML IJ SOLN: 2.0000 mg | Freq: Four times a day (QID) | INTRAMUSCULAR | Status: DC | PRN

## 2014-12-28 MED ORDER — FENTANYL CITRATE (PF) 100 MCG/2ML IJ SOLN
100.0000 ug | Freq: Once | INTRAMUSCULAR | Status: AC
  Administered 2014-12-28: 100 ug via INTRAVENOUS
  Filled 2014-12-28: qty 2

## 2014-12-28 MED ORDER — IOHEXOL 300 MG/ML  SOLN
100.0000 mL | Freq: Once | INTRAMUSCULAR | Status: AC | PRN
  Administered 2014-12-28: 100 mL via INTRAVENOUS

## 2014-12-28 MED ORDER — SODIUM CHLORIDE 0.9 % IV BOLUS (SEPSIS)
1000.0000 mL | Freq: Once | INTRAVENOUS | Status: AC
  Administered 2014-12-28: 1000 mL via INTRAVENOUS

## 2014-12-28 MED ORDER — ONDANSETRON HCL 4 MG PO TABS: 4.0000 mg | ORAL_TABLET | Freq: Four times a day (QID) | ORAL | Status: DC | PRN

## 2014-12-28 MED ORDER — POTASSIUM CHLORIDE CRYS ER 20 MEQ PO TBCR
20.0000 meq | EXTENDED_RELEASE_TABLET | Freq: Once | ORAL | Status: AC
Start: 2014-12-28 — End: 2014-12-29
  Administered 2014-12-29: 20 meq via ORAL
  Filled 2014-12-28: qty 1

## 2014-12-28 MED ORDER — SODIUM CHLORIDE 0.9 % IV SOLN
INTRAVENOUS | Status: DC
Start: 2014-12-28 — End: 2015-01-01
  Administered 2014-12-29 (×2): via INTRAVENOUS
  Administered 2014-12-30: 1000 mL via INTRAVENOUS
  Administered 2014-12-30 – 2014-12-31 (×3): via INTRAVENOUS

## 2014-12-28 NOTE — ED Notes (Signed)
Pt reports abd pain, headache, htn, and recently had colonoscopy. Per md stated he removed a few polyps and otherwise colonoscopy was ok. And was told to come to ed if temperature was above 100

## 2014-12-28 NOTE — Progress Notes (Signed)
Report received from [facility], [place] in ED. Pt is to be admitted on 5w02. Awaiting pt's arrival.

## 2014-12-28 NOTE — ED Notes (Signed)
Reports blood in stool today.

## 2014-12-28 NOTE — H&P (Signed)
[facility] Admission History and Physical       Erik Nguyen XOV:[id number] DOB: 1956-03-15 DOA: 12/28/2014  Referring physician: EDP Dr. Alvino Chapel PCP: Minerva Ends, MD  Specialists:   Chief Complaint: Lower ABD Pain and Rectal Bleeding  HPI: Erik Nguyen is a 59 y.o. male with a history of HTN who presents to the ED with complaints of lower ABD pain and (BRBPR) rectal bleeding since 3 pm today.   He denies any nausea or vomiting. He does report fever and chills.    He had a colonoscopy performred on 05/13 by Dr Deatra Ina and a biopsy and polypectomy were performed.  In the ED his initial hemoglobin was 13.9.  A ct scan of the ABD was performed and revealed findings consistent with Esophagitis, and Colitis, and a right non-obstructing renal stone was incidentally seen.  The EDP consulted GI: Dr Collene Mares who will see the patient in the AM.     Review of Systems:  Constitutional: No Weight Loss, No Weight Gain, Night Sweats, +Fevers, Chills, Dizziness, Light Headedness, Fatigue, or Generalized Weakness HEENT: No Headaches, Difficulty Swallowing,Tooth/Dental Problems,Sore Throat,  No Sneezing, Rhinitis, Ear Ache, Nasal Congestion, or Post Nasal Drip,  Cardio-vascular:  No Chest pain, Orthopnea, PND, Edema in Lower Extremities, Anasarca, Dizziness, Palpitations  Resp: No Dyspnea, No DOE, No Productive Cough, No Non-Productive Cough, No Hemoptysis, No Wheezing.    GI: No Heartburn, Indigestion, +Lower Abdominal Pain, Nausea, Vomiting, Diarrhea, Constipation, Hematemesis, +Hematochezia, Melena, Change in Bowel Habits,  Loss of Appetite  GU: No Dysuria, No Change in Color of Urine, No Urgency or Urinary Frequency, No Flank pain.  Musculoskeletal: No Joint Pain or Swelling, No Decreased Range of Motion, No Back Pain.  Neurologic: No Syncope, No Seizures, Muscle Weakness, Paresthesia, Vision Disturbance or Loss, No Diplopia, No Vertigo, No Difficulty Walking,  Skin: No Rash or  Lesions. Psych: No Change in Mood or Affect, No Depression or Anxiety, No Memory loss, No Confusion, or Hallucinations   Past Medical History  Diagnosis Date  . Priapism   . Hypertension Dx 2016     Past Surgical History  Procedure Laterality Date  . Colonoscopy    . Polypectomy        Prior to Admission medications   Medication Sig Start Date End Date Taking? Authorizing Provider  lisinopril-hydrochlorothiazide (PRINZIDE) 10-12.5 MG per tablet Take 1 tablet by mouth daily. 11/06/14  Yes Boykin Nearing, MD     Allergies  Allergen Reactions  . Latex Itching and Rash    Social History:  reports that he has quit smoking. His smoking use included Cigarettes. He smoked 1.00 pack per day. He has never used smokeless tobacco. He reports that he does not drink alcohol or use illicit drugs.     Family History  Problem Relation Age of Onset  . Asthma Mother   . Cancer Father   . Prostate cancer Father   . Colon cancer Neg Hx   . Rectal cancer Neg Hx   . Stomach cancer Neg Hx        Physical Exam:  GEN:  Pleasant Well Nourished and Well Developed  59 y.o. African American male examined and in no acute distress; cooperative with exam Filed Vitals:   12/28/14 1926 12/28/14 2130  BP: 153/100 170/100  Pulse: 116 108  Temp: 100.3 F (37.9 C)   TempSrc: Oral   Resp: 18 18  Height: 5\' 6"  (1.676 m)   Weight: 73.71 kg (162 lb 8 oz)   SpO2:  98% 94%   Blood pressure 170/100, pulse 108, temperature 100.3 F (37.9 C), temperature source Oral, resp. rate 18, height 5\' 6"  (1.676 m), weight 73.71 kg (162 lb 8 oz), SpO2 94 %. PSYCH: He is alert and oriented x4; does not appear anxious does not appear depressed; affect is normal HEENT: Normocephalic and Atraumatic, Mucous membranes pink; PERRLA; EOM intact; Fundi:  Benign;  No scleral icterus, Nares: Patent, Oropharynx: Clear,  Fair Dentition,    Neck:  FROM, No Cervical Lymphadenopathy nor Thyromegaly or Carotid Bruit; No  JVD; Breasts:: Not examined CHEST WALL: No tenderness CHEST: Normal respiration, clear to auscultation bilaterally HEART: Regular rate and rhythm; no murmurs rubs or gallops BACK: No kyphosis or scoliosis; No CVA tenderness ABDOMEN: Positive Bowel Sounds, Soft Non-Tender, No Rebound or Guarding; No Masses, No Organomegaly Rectal Exam: Not done EXTREMITIES: No Cyanosis, Clubbing, or Edema; No Ulcerations. Genitalia: not examined PULSES: 2+ and symmetric SKIN: Normal hydration no rash or ulceration CNS:  Alert and Oriented x 4, No Focal Deficits Vascular: pulses palpable throughout    Labs on Admission:  Basic Metabolic Panel:  Recent Labs Lab 12/28/14 1931  NA 140  K 3.4*  CL 101  CO2 27  GLUCOSE 133*  BUN 15  CREATININE 1.21  CALCIUM 9.1   Liver Function Tests:  Recent Labs Lab 12/28/14 1931  AST 24  ALT 17  ALKPHOS 53  BILITOT 0.5  PROT 6.9  ALBUMIN 4.2    Recent Labs Lab 12/28/14 1931  LIPASE 20*   No results for input(s): AMMONIA in the last 168 hours. CBC:  Recent Labs Lab 12/28/14 1931  WBC 6.5  NEUTROABS 5.5  HGB 13.9  HCT 38.8*  MCV 89.4  PLT 224   Cardiac Enzymes: No results for input(s): CKTOTAL, CKMB, CKMBINDEX, TROPONINI in the last 168 hours.  BNP (last 3 results) No results for input(s): BNP in the last 8760 hours.  ProBNP (last 3 results) No results for input(s): PROBNP in the last 8760 hours.  CBG: No results for input(s): GLUCAP in the last 168 hours.  Radiological Exams on Admission: Ct Abdomen Pelvis W Contrast  12/28/2014   CLINICAL DATA:  Abdominal pain and fever. GI bleed today. Recent colonoscopy.  EXAM: CT ABDOMEN AND PELVIS WITH CONTRAST  TECHNIQUE: Multidetector CT imaging of the abdomen and pelvis was performed using the standard protocol following bolus administration of intravenous contrast.  CONTRAST:  155mL OMNIPAQUE IOHEXOL 300 MG/ML  SOLN  COMPARISON:  CT 03/24/2011  FINDINGS: Minimal scarring at the right  lung base. There is a 10 mm paraesophageal lymph node adjacent to the distal esophagus. Mild distal esophageal thickening.  There is mild hepatic steatosis. No focal hepatic lesion. The gallbladder is decompressed. No biliary dilatation. The spleen, adrenal glands, and pancreas are unremarkable.  There is a punctate nonobstructing stone in the upper right kidney. No hydronephrosis or obstructive uropathy. Kidneys demonstrate symmetric enhancement and excretion.  Stomach is distended with ingested oral contrast. There are no dilated or thickened small bowel loops, no obstruction. There is fluid distending the cecum and ascending colon with equivocal associated wall thickening. Remainder of the colon is decompressed. Scattered distal colonic diverticula without diverticulitis. No free air, free fluid, or intra-abdominal fluid collection. The appendix is normal.  Abdominal aorta is normal in caliber. No retroperitoneal adenopathy.  Within the pelvis the urinary bladder is minimally distended. The prostate gland is normal in size. No pelvic free fluid. No pelvic adenopathy.  There is degenerative change in  the lumbar spine with facet arthropathy and degenerative disc disease. Cystic change in the right greater than left femoral head/neck likely osteoarthritis. There are no acute or suspicious osseous abnormalities.  IMPRESSION: 1. Fluid distending the cecum and ascending colon with equivocal associated wall thickening, may reflect mild colitis. No findings of colonic perforation. 2. Mild thickening of the distal esophagus with a 10 mm paraesophageal lymph node. This can be seen in the setting of esophagitis, however endoscopic evaluation could be considered if there are risk factors for malignancy. 3. Incidental findings of punctate nonobstructing right renal stone and mild hepatic steatosis.   Electronically Signed   By: Jeb Levering M.D.   On: 12/28/2014 21:10   Dg Abd Acute W/chest  12/28/2014   CLINICAL DATA:   Lower abdominal pain.  Blood in stool for 2 days.  EXAM: DG ABDOMEN ACUTE W/ 1V CHEST  COMPARISON:  03/24/2011  FINDINGS: The lungs appear clear. Cardiac and mediastinal margins appear normal.  No free intraperitoneal gas observed beneath the hemidiaphragms. There is scattered small air-fluid levels in nondilated bowel in the central and right abdomen, primarily in the ascending colon.  No dilated bowel noted.  Transitional L5 morphology.  IMPRESSION: 1. Scattered small air-fluid levels primarily in the ascending colon are noted, and not necessarily abnormal. No dilated bowel to suggest obstruction.   Electronically Signed   By: Van Clines M.D.   On: 12/28/2014 20:37    Assessment/Plan:   59 y.o. male with  Principal Problem:   1.   GI bleed- due to Colitis , Esophagitis, or due to Recent polypectomy   IV Protonix   Monitor H/Hs   Transfuse PRN    GI: Dr Collene Mares to see in AM       Active Problems:   2.   Colitis- on CT scan   IV Cipro and Flagyl        3.   Lower ABD pain- due to #1, and #2   PRN IV Dilaudid     4.   Esophagitis   IV Protonix     5.   HTN (hypertension)   Hold Lisinopril/HCTZ   IV Hydralazine PRN     6.   DVT Prophylaxis   SCDs          Code Status:     FULL CODE        Family Communication:   No Family Present    Disposition Plan:    Inpatient  Status        Time spent: [facility] Pager [phone]   If [place] Please Contact the Day Rounding Team MD for [facility]  If 7PM-7AM, Please Contact Night-Floor Coverage  [email or web address] Password [facility] 12/28/2014, 10:46 PM     ADDENDUM:   Patient was seen and examined on 12/28/2014

## 2014-12-28 NOTE — ED Notes (Signed)
Transporting patient to new room assignment. 

## 2014-12-28 NOTE — ED Provider Notes (Signed)
CSN: [id number]     Arrival date & time 12/28/14  1915 History   First MD Initiated Contact with Patient 12/28/14 1941     Chief Complaint  Patient presents with  . Abdominal Pain     (Consider location/radiation/quality/duration/timing/severity/associated sxs/prior Treatment) Patient is a 59 y.o. male presenting with abdominal pain. The history is provided by the patient.  Abdominal Pain Associated symptoms: fever   Associated symptoms: no chest pain, no diarrhea, no nausea, no shortness of breath and no vomiting    patient presents with abdominal pain and fever. Began today. Had a colonoscopy 2 days ago and reportedly had 2 polyps taken out. States that today he has had 2 episodes of frank blood coming out of his rectum. States there was no stool just blood. Also has a temperature up to 100.3. States his abdomen is more tender. No nausea or vomiting. States he has a slight sore throat. No other bleeding. He is not on anticoagulation.  Past Medical History  Diagnosis Date  . Priapism   . Hypertension Dx 2016   Past Surgical History  Procedure Laterality Date  . Colonoscopy    . Polypectomy     Family History  Problem Relation Age of Onset  . Asthma Mother   . Cancer Father   . Prostate cancer Father   . Colon cancer Neg Hx   . Rectal cancer Neg Hx   . Stomach cancer Neg Hx    History  Substance Use Topics  . Smoking status: Former Smoker -- 1.00 packs/day    Types: Cigarettes  . Smokeless tobacco: Never Used  . Alcohol Use: No     Comment: last use Jan 2015    Review of Systems  Constitutional: Positive for fever. Negative for activity change and appetite change.  Eyes: Negative for pain.  Respiratory: Negative for chest tightness and shortness of breath.   Cardiovascular: Negative for chest pain and leg swelling.  Gastrointestinal: Positive for abdominal pain, blood in stool and abdominal distention. Negative for nausea, vomiting and diarrhea.  Genitourinary:  Negative for flank pain.  Musculoskeletal: Negative for back pain and neck stiffness.  Skin: Negative for rash.  Neurological: Negative for weakness, numbness and headaches.  Psychiatric/Behavioral: Negative for behavioral problems.      Allergies  Latex  Home Medications   Prior to Admission medications   Medication Sig Start Date End Date Taking? Authorizing Provider  lisinopril-hydrochlorothiazide (PRINZIDE) 10-12.5 MG per tablet Take 1 tablet by mouth daily. 11/06/14  Yes Josalyn Funches, MD   BP 163/93 mmHg  Pulse 109  Temp(Src) 99.9 F (37.7 C) (Oral)  Resp 18  Ht 5\' 6"  (1.676 m)  Wt 162 lb 8 oz (73.71 kg)  BMI 26.24 kg/m2  SpO2 100% Physical Exam  Constitutional: He is oriented to person, place, and time. He appears well-developed and well-nourished.  HENT:  Head: Normocephalic and atraumatic.  Cardiovascular: Regular rhythm and normal heart sounds.   No murmur heard. Tachycardia  Pulmonary/Chest: Effort normal and breath sounds normal.  Abdominal: Soft. He exhibits distension. He exhibits no mass. There is tenderness. There is no rebound and no guarding.  Patient mild diffuse tenderness. May have mild distention. No hernias palpated.  Musculoskeletal: Normal range of motion. He exhibits no edema.  Neurological: He is alert and oriented to person, place, and time. No cranial nerve deficit.  Skin: Skin is warm and dry.  Psychiatric: He has a normal mood and affect.  Nursing note and vitals reviewed.  ED Course  Procedures (including critical care time) Labs Review Labs Reviewed  CBC WITH DIFFERENTIAL/PLATELET - Abnormal; Notable for the following:    HCT 38.8 (*)    Neutrophils Relative % 85 (*)    Lymphocytes Relative 8 (*)    Lymphs Abs 0.6 (*)    All other components within normal limits  COMPREHENSIVE METABOLIC PANEL - Abnormal; Notable for the following:    Potassium 3.4 (*)    Glucose, Bld 133 (*)    All other components within normal limits   LIPASE, BLOOD - Abnormal; Notable for the following:    Lipase 20 (*)    All other components within normal limits  MRSA PCR SCREENING  URINALYSIS, ROUTINE W REFLEX MICROSCOPIC  HEMOGLOBIN AND HEMATOCRIT, BLOOD  BASIC METABOLIC PANEL  CBC  HEMOGLOBIN AND HEMATOCRIT, BLOOD  HEMOGLOBIN AND HEMATOCRIT, BLOOD  I-STAT CG4 LACTIC ACID, ED  I-STAT CG4 LACTIC ACID, ED  TYPE AND SCREEN  ABO/RH    Imaging Review Ct Abdomen Pelvis W Contrast  12/28/2014   CLINICAL DATA:  Abdominal pain and fever. GI bleed today. Recent colonoscopy.  EXAM: CT ABDOMEN AND PELVIS WITH CONTRAST  TECHNIQUE: Multidetector CT imaging of the abdomen and pelvis was performed using the standard protocol following bolus administration of intravenous contrast.  CONTRAST:  124mL OMNIPAQUE IOHEXOL 300 MG/ML  SOLN  COMPARISON:  CT 03/24/2011  FINDINGS: Minimal scarring at the right lung base. There is a 10 mm paraesophageal lymph node adjacent to the distal esophagus. Mild distal esophageal thickening.  There is mild hepatic steatosis. No focal hepatic lesion. The gallbladder is decompressed. No biliary dilatation. The spleen, adrenal glands, and pancreas are unremarkable.  There is a punctate nonobstructing stone in the upper right kidney. No hydronephrosis or obstructive uropathy. Kidneys demonstrate symmetric enhancement and excretion.  Stomach is distended with ingested oral contrast. There are no dilated or thickened small bowel loops, no obstruction. There is fluid distending the cecum and ascending colon with equivocal associated wall thickening. Remainder of the colon is decompressed. Scattered distal colonic diverticula without diverticulitis. No free air, free fluid, or intra-abdominal fluid collection. The appendix is normal.  Abdominal aorta is normal in caliber. No retroperitoneal adenopathy.  Within the pelvis the urinary bladder is minimally distended. The prostate gland is normal in size. No pelvic free fluid. No pelvic  adenopathy.  There is degenerative change in the lumbar spine with facet arthropathy and degenerative disc disease. Cystic change in the right greater than left femoral head/neck likely osteoarthritis. There are no acute or suspicious osseous abnormalities.  IMPRESSION: 1. Fluid distending the cecum and ascending colon with equivocal associated wall thickening, may reflect mild colitis. No findings of colonic perforation. 2. Mild thickening of the distal esophagus with a 10 mm paraesophageal lymph node. This can be seen in the setting of esophagitis, however endoscopic evaluation could be considered if there are risk factors for malignancy. 3. Incidental findings of punctate nonobstructing right renal stone and mild hepatic steatosis.   Electronically Signed   By: Jeb Levering M.D.   On: 12/28/2014 21:10   Dg Abd Acute W/chest  12/28/2014   CLINICAL DATA:  Lower abdominal pain.  Blood in stool for 2 days.  EXAM: DG ABDOMEN ACUTE W/ 1V CHEST  COMPARISON:  03/24/2011  FINDINGS: The lungs appear clear. Cardiac and mediastinal margins appear normal.  No free intraperitoneal gas observed beneath the hemidiaphragms. There is scattered small air-fluid levels in nondilated bowel in the central and right abdomen, primarily  in the ascending colon.  No dilated bowel noted.  Transitional L5 morphology.  IMPRESSION: 1. Scattered small air-fluid levels primarily in the ascending colon are noted, and not necessarily abnormal. No dilated bowel to suggest obstruction.   Electronically Signed   By: Van Clines M.D.   On: 12/28/2014 20:37     EKG Interpretation None      MDM   Final diagnoses:  Abdominal pain, unspecified abdominal location  Lower GI bleed    Patient with abdominal pain and GI bleeding after colonoscopy. Also fever. Has had sore throat. CT scan done and does not show perforation but there is a possible colitis. Discussed with Dr. Collene Mares from gastroenterology and I will see the patient  tomorrow he will be admitted to internal medicine.    Davonna Belling, MD 12/29/14 [phone]

## 2014-12-29 ENCOUNTER — Encounter (HOSPITAL_COMMUNITY): Payer: Self-pay | Admitting: *Deleted

## 2014-12-29 ENCOUNTER — Telehealth: Payer: Self-pay | Admitting: *Deleted

## 2014-12-29 DIAGNOSIS — R1030 Lower abdominal pain, unspecified: Secondary | ICD-10-CM

## 2014-12-29 DIAGNOSIS — K921 Melena: Secondary | ICD-10-CM

## 2014-12-29 DIAGNOSIS — R1084 Generalized abdominal pain: Secondary | ICD-10-CM

## 2014-12-29 DIAGNOSIS — E876 Hypokalemia: Secondary | ICD-10-CM | POA: Diagnosis present

## 2014-12-29 DIAGNOSIS — R935 Abnormal findings on diagnostic imaging of other abdominal regions, including retroperitoneum: Secondary | ICD-10-CM

## 2014-12-29 LAB — CBC
HCT: 34.3 % — ABNORMAL LOW (ref 39.0–52.0)
Hemoglobin: 12 g/dL — ABNORMAL LOW (ref 13.0–17.0)
MCH: 31.5 pg (ref 26.0–34.0)
MCHC: 35 g/dL (ref 30.0–36.0)
MCV: 90 fL (ref 78.0–100.0)
PLATELETS: 184 10*3/uL (ref 150–400)
RBC: 3.81 MIL/uL — AB (ref 4.22–5.81)
RDW: 12.1 % (ref 11.5–15.5)
WBC: 4 10*3/uL (ref 4.0–10.5)

## 2014-12-29 LAB — BASIC METABOLIC PANEL
ANION GAP: 6 (ref 5–15)
BUN: 9 mg/dL (ref 6–20)
CALCIUM: 7.9 mg/dL — AB (ref 8.9–10.3)
CO2: 26 mmol/L (ref 22–32)
Chloride: 106 mmol/L (ref 101–111)
Creatinine, Ser: 1.17 mg/dL (ref 0.61–1.24)
GFR calc Af Amer: >60 mL/min (ref >60)
GFR calc non Af Amer: >60 mL/min (ref >60)
Glucose, Bld: 115 mg/dL — ABNORMAL HIGH (ref 65–99)
Potassium: 3.2 mmol/L — ABNORMAL LOW (ref 3.5–5.1)
Sodium: 138 mmol/L (ref 135–145)

## 2014-12-29 LAB — HEMOGLOBIN AND HEMATOCRIT, BLOOD
HCT: 36.9 % — ABNORMAL LOW (ref 39.0–52.0)
HEMATOCRIT: 34.6 % — AB (ref 39.0–52.0)
HEMOGLOBIN: 11.9 g/dL — AB (ref 13.0–17.0)
Hemoglobin: 13 g/dL (ref 13.0–17.0)

## 2014-12-29 LAB — MRSA PCR SCREENING: MRSA by PCR: NEGATIVE

## 2014-12-29 MED ORDER — POTASSIUM CHLORIDE CRYS ER 20 MEQ PO TBCR
40.0000 meq | EXTENDED_RELEASE_TABLET | Freq: Four times a day (QID) | ORAL | Status: AC
  Administered 2014-12-29 (×2): 40 meq via ORAL
  Filled 2014-12-29 (×2): qty 2

## 2014-12-29 NOTE — Consult Note (Signed)
Referring Provider: [facility] Primary Care Physician:  Minerva Ends, MD Primary Gastroenterologist:  Dr. Deatra Ina  Reason for Consultation:   LGI bleed    HPI: Erik Nguyen is a 59 y.o. male who was seen by his primary care provider in March with complaints of intermittent rectal bleeding with hard stools. He was referred for colonoscopy and underwent a colonoscopy on 12/26/2014 at which time 2 small polyps were removed with cold snare from the cecum. He went home that night and felt well, and on the following day Saturday, May 14 he felt well. He went to work on Sunday, May 15 at [employer] and began to have mid abdominal crampy type pain. He then had a bowel movement that was bright red blood per rectum. When he got home after work, he had another bloody bowel movement that he says was just blood and no stool. He says it was bright red blood with no clots. He called the gastroenterology office and was advised to go to the emergency room. He denies nausea or vomiting but says he did feel feverish and had chills. CT of the abdomen and pelvis was performed in the emergency room and revealed a right sided colitis and a right nonobstructing renal stone. He was also noted to have mild thickening of the distal esophagus.  This morning he complains of abdominal pain with mild nausea but no vomiting. He had a bloody bowel movement last night and reports that he had another one this morning. He denies any dysphagia. He is currently on IV Cipro and Flagyl. White blood cell count is 4 this morning down from 6.5 yesterday. Hemoglobin this morning is 12 down from 13.9 yesterday. Patient denies use of NSAIDs or aspirin.      Past Medical History  Diagnosis Date  . Priapism   . Hypertension Dx 2016    Past Surgical History  Procedure Laterality Date  . Colonoscopy    . Polypectomy      Prior to Admission medications   Medication Sig Start Date End Date Taking?  Authorizing Provider  lisinopril-hydrochlorothiazide (PRINZIDE) 10-12.5 MG per tablet Take 1 tablet by mouth daily. 11/06/14  Yes Boykin Nearing, MD    Current Facility-Administered Medications  Medication Dose Route Frequency Provider Last Rate Last Dose  . 0.9 %  sodium chloride infusion   Intravenous STAT Davonna Belling, MD 125 mL/hr at 12/28/14 2342    . 0.9 %  sodium chloride infusion   Intravenous Continuous Theressa Millard, MD 75 mL/hr at 12/29/14 1004    . acetaminophen (TYLENOL) tablet 650 mg  650 mg Oral Q6H PRN Theressa Millard, MD       Or  . acetaminophen (TYLENOL) suppository 650 mg  650 mg Rectal Q6H PRN Theressa Millard, MD      . alum & mag hydroxide-simeth (MAALOX/MYLANTA) 200-200-20 MG/5ML suspension 30 mL  30 mL Oral Q6H PRN Theressa Millard, MD      . ciprofloxacin (CIPRO) IVPB 400 mg  400 mg Intravenous Q12H Theressa Millard, MD   400 mg at 12/29/14 0948  . hydrALAZINE (APRESOLINE) injection 2 mg  2 mg Intravenous Q6H PRN Theressa Millard, MD      . HYDROmorphone (DILAUDID) injection 0.5-1 mg  0.5-1 mg Intravenous Q3H PRN Theressa Millard, MD   1 mg at 12/29/14 0947  . metroNIDAZOLE (FLAGYL) IVPB 500 mg  500 mg Intravenous Q8H Harvette Evonnie Dawes, MD   500 mg  at 12/29/14 0543  . ondansetron (ZOFRAN) tablet 4 mg  4 mg Oral Q6H PRN Theressa Millard, MD       Or  . ondansetron (ZOFRAN) injection 4 mg  4 mg Intravenous Q6H PRN Theressa Millard, MD      . oxyCODONE (Oxy IR/ROXICODONE) immediate release tablet 5 mg  5 mg Oral Q4H PRN Theressa Millard, MD      . pantoprazole (PROTONIX) injection 40 mg  40 mg Intravenous Q12H Theressa Millard, MD   40 mg at 12/29/14 0948  . sodium chloride 0.9 % injection 3 mL  3 mL Intravenous Q12H Theressa Millard, MD   3 mL at 12/29/14 1779   Facility-Administered Medications Ordered in Other Encounters  Medication Dose Route Frequency Provider Last Rate Last Dose  . 0.9 %  sodium chloride infusion  500 mL  Intravenous Continuous Inda Castle, MD        Allergies as of 12/28/2014 - Review Complete 12/28/2014  Allergen Reaction Noted  . Latex Itching and Rash 12/28/2014    Family History  Problem Relation Age of Onset  . Asthma Mother   . Cancer Father   . Prostate cancer Father   . Colon cancer Neg Hx   . Rectal cancer Neg Hx   . Stomach cancer Neg Hx     History   Social History  . Marital Status: Legally Separated    Spouse Name: N/A  . Number of Children: N/A  . Years of Education: N/A   Occupational History  . Not on file.   Social History Main Topics  . Smoking status: Former Smoker -- 1.00 packs/day    Types: Cigarettes  . Smokeless tobacco: Never Used  . Alcohol Use: No     Comment: last use Jan 2015  . Drug Use: 7.00 per week    Special: Cocaine, "Crack" cocaine     Comment: last use jan 2015  . Sexual Activity: Not on file   Other Topics Concern  . Not on file   Social History Narrative    Review of Systems: Gen: Denies any  sweats, anorexia, fatigue, weakness, malaise, weight loss, and sleep disorder. Admits to fever and chills CV: Denies chest pain, angina, palpitations, syncope, orthopnea, PND, peripheral edema, and claudication. Resp: Denies dyspnea at rest, dyspnea with exercise, cough, sputum, wheezing, coughing up blood, and pleurisy. GI: admits to bright red blood per rectum. Denies dysphagia admits to diffuse lower abdominal pain.  GU : Denies urinary burning, blood in urine, urinary frequency, urinary hesitancy, nocturnal urination, and urinary incontinence. MS: Denies joint pain, limitation of movement, and swelling, stiffness, low back pain, extremity pain. Denies muscle weakness, cramps, atrophy.  Derm: Denies rash, itching, dry skin, hives, moles, warts, or unhealing ulcers.  Psych: Denies depression, anxiety, memory loss, suicidal ideation, hallucinations, paranoia, and confusion. Heme: Denies bruising, bleeding, and enlarged lymph  nodes. Neuro:  Denies any headaches, dizziness, paresthesias. Endo:  Denies any problems with DM, thyroid, adrenal function.  Physical Exam: Vital signs in last 24 hours: Temp:  [98.6 F (37 C)-100.3 F (37.9 C)] 98.6 F (37 C) (05/16 0959) Pulse Rate:  [96-116] 96 (05/16 0959) Resp:  [15-18] 16 (05/16 0459) BP: (121-170)/(73-100) 121/73 mmHg (05/16 0959) SpO2:  [94 %-100 %] 97 % (05/16 0959) Weight:  [162 lb 8 oz (73.71 kg)] 162 lb 8 oz (73.71 kg) (05/15 1926) Last BM Date: 12/28/14 General:   Alert,  Well-developed, well-nourished, cooperative in NAD Head:  Normocephalic and atraumatic. Eyes:  Sclera clear, no icterus. Conjunctiva pink. Ears:  Normal auditory acuity. Nose:  No deformity, discharge or lesions. Mouth:  No deformity or lesions.   Neck:  Supple; no masses or thyromegaly. Lungs:  Clear throughout to auscultation.   No wheezes, crackles, or rhonchi.  Heart:  Regular rate and rhythm; no murmurs, clicks, rubs,  or gallops. Abdomen:  Soft, mild diffuse tenderness,BS active,nonpalp mass or hsm.   Rectal: scant mucus with red steaks, heme positive Msk:  Symmetrical without gross deformities. . Pulses:  Normal pulses noted. Extremities: Without clubbing or edema. Neurologic: Alert and  oriented x4;  grossly normal neurologically. Skin:  Intact without significant lesions or rashes.. Psych: Alert and cooperative. Normal mood and affect.  Intake/Output from previous day: 05/15 0701 - 05/16 0700 In: 2036.3 [P.O.:220; I.V.:416.3; IV Piggyback:1400] Out: -  Intake/Output this shift: Total I/O In: 300 [P.O.:300] Out: 350 [Urine:350]  Lab Results:  Recent Labs  12/28/14 1931 12/29/14 0003 12/29/14 0647  WBC 6.5  --  4.0  HGB 13.9 13.0 12.0*  HCT 38.8* 36.9* 34.3*  PLT 224  --  184   BMET  Recent Labs  12/28/14 1931 12/29/14 0647  NA 140 138  K 3.4* 3.2*  CL 101 106  CO2 27 26  GLUCOSE 133* 115*  BUN 15 9  CREATININE 1.21 1.17  CALCIUM 9.1 7.9*    LFT  Recent Labs  12/28/14 1931  PROT 6.9  ALBUMIN 4.2  AST 24  ALT 17  ALKPHOS 53  BILITOT 0.5      Studies/Results: Ct Abdomen Pelvis W Contrast  12/28/2014   CLINICAL DATA:  Abdominal pain and fever. GI bleed today. Recent colonoscopy.  EXAM: CT ABDOMEN AND PELVIS WITH CONTRAST  TECHNIQUE: Multidetector CT imaging of the abdomen and pelvis was performed using the standard protocol following bolus administration of intravenous contrast.  CONTRAST:  140mL OMNIPAQUE IOHEXOL 300 MG/ML  SOLN  COMPARISON:  CT 03/24/2011  FINDINGS: Minimal scarring at the right lung base. There is a 10 mm paraesophageal lymph node adjacent to the distal esophagus. Mild distal esophageal thickening.  There is mild hepatic steatosis. No focal hepatic lesion. The gallbladder is decompressed. No biliary dilatation. The spleen, adrenal glands, and pancreas are unremarkable.  There is a punctate nonobstructing stone in the upper right kidney. No hydronephrosis or obstructive uropathy. Kidneys demonstrate symmetric enhancement and excretion.  Stomach is distended with ingested oral contrast. There are no dilated or thickened small bowel loops, no obstruction. There is fluid distending the cecum and ascending colon with equivocal associated wall thickening. Remainder of the colon is decompressed. Scattered distal colonic diverticula without diverticulitis. No free air, free fluid, or intra-abdominal fluid collection. The appendix is normal.  Abdominal aorta is normal in caliber. No retroperitoneal adenopathy.  Within the pelvis the urinary bladder is minimally distended. The prostate gland is normal in size. No pelvic free fluid. No pelvic adenopathy.  There is degenerative change in the lumbar spine with facet arthropathy and degenerative disc disease. Cystic change in the right greater than left femoral head/neck likely osteoarthritis. There are no acute or suspicious osseous abnormalities.  IMPRESSION: 1. Fluid  distending the cecum and ascending colon with equivocal associated wall thickening, may reflect mild colitis. No findings of colonic perforation. 2. Mild thickening of the distal esophagus with a 10 mm paraesophageal lymph node. This can be seen in the setting of esophagitis, however endoscopic evaluation could be considered if there are risk factors for  malignancy. 3. Incidental findings of punctate nonobstructing right renal stone and mild hepatic steatosis.   Electronically Signed   By: Jeb Levering M.D.   On: 12/28/2014 21:10   Dg Abd Acute W/chest  12/28/2014   CLINICAL DATA:  Lower abdominal pain.  Blood in stool for 2 days.  EXAM: DG ABDOMEN ACUTE W/ 1V CHEST  COMPARISON:  03/24/2011  FINDINGS: The lungs appear clear. Cardiac and mediastinal margins appear normal.  No free intraperitoneal gas observed beneath the hemidiaphragms. There is scattered small air-fluid levels in nondilated bowel in the central and right abdomen, primarily in the ascending colon.  No dilated bowel noted.  Transitional L5 morphology.  IMPRESSION: 1. Scattered small air-fluid levels primarily in the ascending colon are noted, and not necessarily abnormal. No dilated bowel to suggest obstruction.   Electronically Signed   By: Van Clines M.D.   On: 12/28/2014 20:37   PROCEDURES: Colonoscopy 12/26/14: ENDOSCOPIC IMPRESSION: 1. Mild diverticulosis was noted in the ascending colon 2. Internal hemorrhoids 3. Two sessile polyps ranging from 2 to 43mm in size were found at the cecum; polypectomy was performed with a cold snare; polypectomy was performed with cold forceps RECOMMENDATIONS: If the polyp(s) removed today are proven to be adenomatous (pre-cancerous) polyps, you will need a repeat colonoscopy in 5 years. Otherwise you should continue to follow colorectal cancer screening guidelines for "routine risk" patients with colonoscopy in 10 years. You will receive a letter within 1-2 weeks with the results of your  biopsy as well as final recommendations. Please call my office if you have not received a letter after 3 weeks.  IMPRESSION/PLAN:   #36. 59 year old male status post colonoscopy on May 13 with cold snare removal of 2 small polyps, admitted May 15 with bright red blood per rectum and lower abdominal cramping. CT with question of mild colitis versus diverticulitis. Currently on Cipro and Flagyl. We will back down to nothing by mouth except for ice chips. Monitor H&H. If bleeding diverticular in nature will likely stop on its own.  #2. Mild thickening of distal esophagus with 10 mm paraesophageal lymph node. This is an incidental finding. Patient currently denies dysphagia or odynophagia. Would continue PPI.  Will review patient with Dr. Henrene Pastor as to further recommendations.    Hvozdovic, Deloris Ping 12/29/2014,  Pager [phone]  GI ATTENDING  History, laboratories, x-rays, recent colonoscopy report reviewed. Case discussed with Dr. Deatra Ina. I personally saw and examined the patient last evening. Agree with H&P as outlined above. Patient did well post colonoscopy for 2 days. Subsequently developed persistent lower abdominal pain and rectal bleeding. Unclear rectal bleeding is different than he has had previously, due to hemorrhoids. CT scan questions mild colitis in the right colon. Not overwhelming. No other significant features. Still uncomfortable is evening. States he did pass some blood earlier today. Exam reveals mild discomfort but is otherwise normal. Not clear to me why he is having pain. I suspect the bleeding is hemorrhoidal as previous. Find it hard to imagine it's related to his colonoscopy. This point, nothing by mouth and close observation. Discussed with patient and his wife.  Docia Chuck. Geri Seminole., M.D. [facility] Division of Gastroenterology.

## 2014-12-29 NOTE — Progress Notes (Signed)
[facility] PROGRESS NOTE   Loui Massenburg CNO:[id number] DOB: 1956-08-15 DOA: 12/28/2014 PCP: Minerva Ends, MD  HPI/Subjective: Seen with wife at bedside, wife was writing notes while I was talking. Still complaining about abdominal pain and tenderness, 3 bloody bowel movements yesterday.  Assessment/Plan: Principal Problem:   GI bleed Active Problems:   HTN (hypertension)   Colitis   Esophagitis   Lower abdominal pain    GI bleed Patient admitted to the hospital with bright red blood per rectum. Per wife patient had bleeding before the colonoscopy, as a matter fact that why he had colonoscopy. Has had esophagitis/gastritis evidence in the CT scan. Patient is nothing by mouth, on PPI. GI consulted, and clear to me of bleeding, and from colitis, diverticular versus hemorrhoidal.  Colitis CT scan of abdomen pelvis was done, showed no evidence of free air or other complications of colonoscopy. Mild colitis, patient currently nothing by mouth. Started on Flagyl and ciprofloxacin, continue antibiotics.  Esophagitis Findings on CT scan, will place on twice a day PPI.  Hypokalemia Repleted with oral supplements  Code Status: Full Code Family Communication: Plan discussed with the patient. Disposition Plan: Remains inpatient Diet: Diet NPO time specified Except for: [employer], Sips with Meds  Consultants:  GI  Procedures:  None  Antibiotics:  Cipro and Flagyl   Objective: Filed Vitals:   12/29/14 1341  BP: 124/81  Pulse: 87  Temp: 99.1 F (37.3 C)  Resp: 18    Intake/Output Summary (Last 24 hours) at 12/29/14 1419 Last data filed at 12/29/14 1307  Gross per 24 hour  Intake 2336.25 ml  Output    550 ml  Net 1786.25 ml   Filed Weights   12/28/14 1926  Weight: 73.71 kg (162 lb 8 oz)    Exam: General: Alert and awake, oriented x3, not in any acute distress. HEENT: anicteric sclera, pupils reactive to light and accommodation, EOMI CVS:  S1-S2 clear, no murmur rubs or gallops Chest: clear to auscultation bilaterally, no wheezing, rales or rhonchi Abdomen: soft nontender, nondistended, normal bowel sounds, no organomegaly Extremities: no cyanosis, clubbing or edema noted bilaterally Neuro: Cranial nerves II-XII intact, no focal neurological deficits  Data Reviewed: Basic Metabolic Panel:  Recent Labs Lab 12/28/14 1931 12/29/14 0647  NA 140 138  K 3.4* 3.2*  CL 101 106  CO2 27 26  GLUCOSE 133* 115*  BUN 15 9  CREATININE 1.21 1.17  CALCIUM 9.1 7.9*   Liver Function Tests:  Recent Labs Lab 12/28/14 1931  AST 24  ALT 17  ALKPHOS 53  BILITOT 0.5  PROT 6.9  ALBUMIN 4.2    Recent Labs Lab 12/28/14 1931  LIPASE 20*   No results for input(s): AMMONIA in the last 168 hours. CBC:  Recent Labs Lab 12/28/14 1931 12/29/14 0003 12/29/14 0647  WBC 6.5  --  4.0  NEUTROABS 5.5  --   --   HGB 13.9 13.0 12.0*  HCT 38.8* 36.9* 34.3*  MCV 89.4  --  90.0  PLT 224  --  184   Cardiac Enzymes: No results for input(s): CKTOTAL, CKMB, CKMBINDEX, TROPONINI in the last 168 hours. BNP (last 3 results) No results for input(s): BNP in the last 8760 hours.  ProBNP (last 3 results) No results for input(s): PROBNP in the last 8760 hours.  CBG: No results for input(s): GLUCAP in the last 168 hours.  Micro Recent Results (from the past 240 hour(s))  MRSA PCR Screening     Status: None  Collection Time: 12/28/14 11:50 PM  Result Value Ref Range Status   MRSA by PCR NEGATIVE NEGATIVE Final    Comment:        The GeneXpert MRSA Assay (FDA approved for NASAL specimens only), is one component of a comprehensive MRSA colonization surveillance program. It is not intended to diagnose MRSA infection nor to guide or monitor treatment for MRSA infections.      Studies: Ct Abdomen Pelvis W Contrast  12/28/2014   CLINICAL DATA:  Abdominal pain and fever. GI bleed today. Recent colonoscopy.  EXAM: CT ABDOMEN AND  PELVIS WITH CONTRAST  TECHNIQUE: Multidetector CT imaging of the abdomen and pelvis was performed using the standard protocol following bolus administration of intravenous contrast.  CONTRAST:  17mL OMNIPAQUE IOHEXOL 300 MG/ML  SOLN  COMPARISON:  CT 03/24/2011  FINDINGS: Minimal scarring at the right lung base. There is a 10 mm paraesophageal lymph node adjacent to the distal esophagus. Mild distal esophageal thickening.  There is mild hepatic steatosis. No focal hepatic lesion. The gallbladder is decompressed. No biliary dilatation. The spleen, adrenal glands, and pancreas are unremarkable.  There is a punctate nonobstructing stone in the upper right kidney. No hydronephrosis or obstructive uropathy. Kidneys demonstrate symmetric enhancement and excretion.  Stomach is distended with ingested oral contrast. There are no dilated or thickened small bowel loops, no obstruction. There is fluid distending the cecum and ascending colon with equivocal associated wall thickening. Remainder of the colon is decompressed. Scattered distal colonic diverticula without diverticulitis. No free air, free fluid, or intra-abdominal fluid collection. The appendix is normal.  Abdominal aorta is normal in caliber. No retroperitoneal adenopathy.  Within the pelvis the urinary bladder is minimally distended. The prostate gland is normal in size. No pelvic free fluid. No pelvic adenopathy.  There is degenerative change in the lumbar spine with facet arthropathy and degenerative disc disease. Cystic change in the right greater than left femoral head/neck likely osteoarthritis. There are no acute or suspicious osseous abnormalities.  IMPRESSION: 1. Fluid distending the cecum and ascending colon with equivocal associated wall thickening, may reflect mild colitis. No findings of colonic perforation. 2. Mild thickening of the distal esophagus with a 10 mm paraesophageal lymph node. This can be seen in the setting of esophagitis, however  endoscopic evaluation could be considered if there are risk factors for malignancy. 3. Incidental findings of punctate nonobstructing right renal stone and mild hepatic steatosis.   Electronically Signed   By: Jeb Levering M.D.   On: 12/28/2014 21:10   Dg Abd Acute W/chest  12/28/2014   CLINICAL DATA:  Lower abdominal pain.  Blood in stool for 2 days.  EXAM: DG ABDOMEN ACUTE W/ 1V CHEST  COMPARISON:  03/24/2011  FINDINGS: The lungs appear clear. Cardiac and mediastinal margins appear normal.  No free intraperitoneal gas observed beneath the hemidiaphragms. There is scattered small air-fluid levels in nondilated bowel in the central and right abdomen, primarily in the ascending colon.  No dilated bowel noted.  Transitional L5 morphology.  IMPRESSION: 1. Scattered small air-fluid levels primarily in the ascending colon are noted, and not necessarily abnormal. No dilated bowel to suggest obstruction.   Electronically Signed   By: Van Clines M.D.   On: 12/28/2014 20:37    Scheduled Meds: . ciprofloxacin  400 mg Intravenous Q12H  . metronidazole  500 mg Intravenous Q8H  . pantoprazole (PROTONIX) IV  40 mg Intravenous Q12H  . potassium chloride  40 mEq Oral Q6H  . sodium chloride  3 mL Intravenous Q12H   Continuous Infusions: . sodium chloride 75 mL/hr at 12/29/14 1004       Time spent: 35 minutes    [facility] A  [facility] Pager [phone] If 7PM-7AM, please contact night-coverage at [email or web address], password [facility] 12/29/2014, 2:19 PM  LOS: 1 day

## 2014-12-29 NOTE — Progress Notes (Signed)
NURSING PROGRESS NOTE  Rahim Astorga [id number] Admission Data: 12/29/2014 12:05 AM Attending Provider: Theressa Millard, MD YSA:[id number], Lennox Laity, MD Code Status:full  Prabhav Faulkenberry is a 59 y.o. male patient admitted from ED:  -No acute distress noted.  -No complaints of shortness of breath.  -No complaints of chest pain.   Cardiac Monitoring: Box # 18 in place. Cardiac monitor yields:ST  Blood pressure 163/93, pulse 109, temperature 99.9 F (37.7 C), temperature source Oral, resp. rate 18, height 5\' 6"  (1.676 m), weight 73.71 kg (162 lb 8 oz), SpO2 100 %.   IV Fluids:  IV in place, occlusive dsg intact without redness, IV cath intact in rt [facility] Allergies:  Latex  Past Medical History:   has a past medical history of Priapism and Hypertension (Dx 2016).  Past Surgical History:   has past surgical history that includes Colonoscopy and Polypectomy.  Social History:   reports that he has quit smoking. His smoking use included Cigarettes. He smoked 1.00 pack per day. He has never used smokeless tobacco. He reports that he uses illicit drugs (Cocaine and "Crack" cocaine) about 7 times per week. He reports that he does not drink alcohol.  Skin: Intact  Patient/Family orientated to room. Information packet given to patient/family. Admission inpatient armband information verified with patient/family to include name and date of birth and placed on patient arm. Side rails up x 2, fall assessment and education completed with patient/family. Patient/family able to verbalize understanding of risk associated with falls and verbalized understanding to call for assistance before getting out of bed. Call light within reach. Patient/family able to voice and demonstrate understanding of unit orientation instructions.

## 2014-12-29 NOTE — Progress Notes (Signed)
Utilization review completed. Sayla Golonka, RN, BSN. 

## 2014-12-29 NOTE — Telephone Encounter (Signed)
  Follow up Call-  Call back number 12/26/2014  Post procedure Call Back phone  # [phone]  Permission to leave phone message Yes     Patient questions:  Do you have a fever, pain , or abdominal swelling? Yes.   Pain Score  5 *  Have you tolerated food without any problems? No.  Have you been able to return to your normal activities? No.  Do you have any questions about your discharge instructions: Diet   No. Medications  No. Follow up visit  No.  Do you have questions or concerns about your Care? No.  Actions: * If pain score is 4 or above:On f/u callback pt says he called after hours # given to him on d/c from colonoscopy on 5 /13/16 and was told to go to ER. Pt is currently in hospital for abd pain, fever ,and blood from rectum . Pt not sure if he has been seen yet by one of the [facility] GI physicians Physicians. This phone note will be forwarded to Dr. Deatra Ina,  provider Notified : Erskine Emery, MD.

## 2014-12-30 DIAGNOSIS — R935 Abnormal findings on diagnostic imaging of other abdominal regions, including retroperitoneum: Secondary | ICD-10-CM | POA: Insufficient documentation

## 2014-12-30 DIAGNOSIS — K921 Melena: Secondary | ICD-10-CM | POA: Insufficient documentation

## 2014-12-30 DIAGNOSIS — R1084 Generalized abdominal pain: Secondary | ICD-10-CM | POA: Insufficient documentation

## 2014-12-30 LAB — CBC
HEMATOCRIT: 32.7 % — AB (ref 39.0–52.0)
HEMOGLOBIN: 11.5 g/dL — AB (ref 13.0–17.0)
MCH: 31.4 pg (ref 26.0–34.0)
MCHC: 35.2 g/dL (ref 30.0–36.0)
MCV: 89.3 fL (ref 78.0–100.0)
PLATELETS: 180 10*3/uL (ref 150–400)
RBC: 3.66 MIL/uL — ABNORMAL LOW (ref 4.22–5.81)
RDW: 11.9 % (ref 11.5–15.5)
WBC: 3.6 10*3/uL — ABNORMAL LOW (ref 4.0–10.5)

## 2014-12-30 LAB — HEMOGLOBIN AND HEMATOCRIT, BLOOD
HCT: 33.9 % — ABNORMAL LOW (ref 39.0–52.0)
Hemoglobin: 11.8 g/dL — ABNORMAL LOW (ref 13.0–17.0)

## 2014-12-30 MED ORDER — POTASSIUM CHLORIDE CRYS ER 20 MEQ PO TBCR
40.0000 meq | EXTENDED_RELEASE_TABLET | Freq: Two times a day (BID) | ORAL | Status: AC
  Administered 2014-12-30 (×2): 40 meq via ORAL
  Filled 2014-12-30 (×2): qty 2

## 2014-12-30 MED ORDER — SODIUM CHLORIDE 0.9 % IV BOLUS (SEPSIS)
500.0000 mL | Freq: Once | INTRAVENOUS | Status: AC
  Administered 2014-12-30: 500 mL via INTRAVENOUS

## 2014-12-30 NOTE — Progress Notes (Signed)
Pt had a stool with very small streak of blood through it.

## 2014-12-30 NOTE — Progress Notes (Signed)
[facility] PROGRESS NOTE   Erik Nguyen TGY:[id number] DOB: 12-25-1955 DOA: 12/28/2014 PCP: Minerva Ends, MD  HPI/Subjective: Fever earlier today, patient did not have more abdominal pain or leukocytosis. If spikes fever again will repeat CT scan of abdomen/pelvis, currently on Cipro/[place]  Assessment/Plan: Principal Problem:   GI bleed Active Problems:   HTN (hypertension)   Colitis   Esophagitis   Lower abdominal pain   Hypokalemia    GI bleed Patient admitted to the hospital with bright red blood per rectum. Per wife patient had bleeding before the colonoscopy, as a matter fact that why he had colonoscopy. Has had esophagitis/gastritis evidence in the CT scan. Patient is nothing by mouth, on BID PPI. GI consulted, source of bleeding is unclear bleeding: from colitis, diverticular versus hemorrhoidal. Currently no bowel movements.  Colitis CT scan of abdomen pelvis was done, showed no evidence of free air or other complications of colonoscopy. Mild colitis versus diverticulitis, patient currently nothing by mouth. Started on Flagyl and ciprofloxacin, continue antibiotics. Spikes fever earlier today, very low threshold to repeat scans if abdominal pain worsens or develops fever again.  Esophagitis Findings on CT scan, will place on twice a day PPI.  Hypokalemia Repleted with oral supplements, check BMP in a.m.  Code Status: Full Code Family Communication: Plan discussed with the patient. Disposition Plan: Remains inpatient Diet: Diet clear liquid Room service appropriate?: Yes; Fluid consistency:: Thin  Consultants:  GI  Procedures:  None  Antibiotics:  Cipro and Flagyl   Objective: Filed Vitals:   12/30/14 1115  BP: 133/84  Pulse: 74  Temp: 98.2 F (36.8 C)  Resp: 18    Intake/Output Summary (Last 24 hours) at 12/30/14 1334 Last data filed at 12/30/14 1330  Gross per 24 hour  Intake 2791.25 ml  Output   2050 ml  Net 741.25 ml    Filed Weights   12/28/14 1926  Weight: 73.71 kg (162 lb 8 oz)    Exam: General: Alert and awake, oriented x3, not in any acute distress. HEENT: anicteric sclera, pupils reactive to light and accommodation, EOMI CVS: S1-S2 clear, no murmur rubs or gallops Chest: clear to auscultation bilaterally, no wheezing, rales or rhonchi Abdomen: soft nontender, nondistended, normal bowel sounds, no organomegaly Extremities: no cyanosis, clubbing or edema noted bilaterally Neuro: Cranial nerves II-XII intact, no focal neurological deficits  Data Reviewed: Basic Metabolic Panel:  Recent Labs Lab 12/28/14 1931 12/29/14 0647  NA 140 138  K 3.4* 3.2*  CL 101 106  CO2 27 26  GLUCOSE 133* 115*  BUN 15 9  CREATININE 1.21 1.17  CALCIUM 9.1 7.9*   Liver Function Tests:  Recent Labs Lab 12/28/14 1931  AST 24  ALT 17  ALKPHOS 53  BILITOT 0.5  PROT 6.9  ALBUMIN 4.2    Recent Labs Lab 12/28/14 1931  LIPASE 20*   No results for input(s): AMMONIA in the last 168 hours. CBC:  Recent Labs Lab 12/28/14 1931 12/29/14 0003 12/29/14 0647 12/29/14 1601 12/30/14 0132 12/30/14 0725  WBC 6.5  --  4.0  --   --  3.6*  NEUTROABS 5.5  --   --   --   --   --   HGB 13.9 13.0 12.0* 11.9* 11.8* 11.5*  HCT 38.8* 36.9* 34.3* 34.6* 33.9* 32.7*  MCV 89.4  --  90.0  --   --  89.3  PLT 224  --  184  --   --  180   Cardiac Enzymes: No results  for input(s): CKTOTAL, CKMB, CKMBINDEX, TROPONINI in the last 168 hours. BNP (last 3 results) No results for input(s): BNP in the last 8760 hours.  ProBNP (last 3 results) No results for input(s): PROBNP in the last 8760 hours.  CBG: No results for input(s): GLUCAP in the last 168 hours.  Micro Recent Results (from the past 240 hour(s))  MRSA PCR Screening     Status: None   Collection Time: 12/28/14 11:50 PM  Result Value Ref Range Status   MRSA by PCR NEGATIVE NEGATIVE Final    Comment:        The GeneXpert MRSA Assay (FDA approved for  NASAL specimens only), is one component of a comprehensive MRSA colonization surveillance program. It is not intended to diagnose MRSA infection nor to guide or monitor treatment for MRSA infections.      Studies: Ct Abdomen Pelvis W Contrast  12/28/2014   CLINICAL DATA:  Abdominal pain and fever. GI bleed today. Recent colonoscopy.  EXAM: CT ABDOMEN AND PELVIS WITH CONTRAST  TECHNIQUE: Multidetector CT imaging of the abdomen and pelvis was performed using the standard protocol following bolus administration of intravenous contrast.  CONTRAST:  154mL OMNIPAQUE IOHEXOL 300 MG/ML  SOLN  COMPARISON:  CT 03/24/2011  FINDINGS: Minimal scarring at the right lung base. There is a 10 mm paraesophageal lymph node adjacent to the distal esophagus. Mild distal esophageal thickening.  There is mild hepatic steatosis. No focal hepatic lesion. The gallbladder is decompressed. No biliary dilatation. The spleen, adrenal glands, and pancreas are unremarkable.  There is a punctate nonobstructing stone in the upper right kidney. No hydronephrosis or obstructive uropathy. Kidneys demonstrate symmetric enhancement and excretion.  Stomach is distended with ingested oral contrast. There are no dilated or thickened small bowel loops, no obstruction. There is fluid distending the cecum and ascending colon with equivocal associated wall thickening. Remainder of the colon is decompressed. Scattered distal colonic diverticula without diverticulitis. No free air, free fluid, or intra-abdominal fluid collection. The appendix is normal.  Abdominal aorta is normal in caliber. No retroperitoneal adenopathy.  Within the pelvis the urinary bladder is minimally distended. The prostate gland is normal in size. No pelvic free fluid. No pelvic adenopathy.  There is degenerative change in the lumbar spine with facet arthropathy and degenerative disc disease. Cystic change in the right greater than left femoral head/neck likely osteoarthritis.  There are no acute or suspicious osseous abnormalities.  IMPRESSION: 1. Fluid distending the cecum and ascending colon with equivocal associated wall thickening, may reflect mild colitis. No findings of colonic perforation. 2. Mild thickening of the distal esophagus with a 10 mm paraesophageal lymph node. This can be seen in the setting of esophagitis, however endoscopic evaluation could be considered if there are risk factors for malignancy. 3. Incidental findings of punctate nonobstructing right renal stone and mild hepatic steatosis.   Electronically Signed   By: Jeb Levering M.D.   On: 12/28/2014 21:10   Dg Abd Acute W/chest  12/28/2014   CLINICAL DATA:  Lower abdominal pain.  Blood in stool for 2 days.  EXAM: DG ABDOMEN ACUTE W/ 1V CHEST  COMPARISON:  03/24/2011  FINDINGS: The lungs appear clear. Cardiac and mediastinal margins appear normal.  No free intraperitoneal gas observed beneath the hemidiaphragms. There is scattered small air-fluid levels in nondilated bowel in the central and right abdomen, primarily in the ascending colon.  No dilated bowel noted.  Transitional L5 morphology.  IMPRESSION: 1. Scattered small air-fluid levels primarily in the  ascending colon are noted, and not necessarily abnormal. No dilated bowel to suggest obstruction.   Electronically Signed   By: Van Clines M.D.   On: 12/28/2014 20:37    Scheduled Meds: . ciprofloxacin  400 mg Intravenous Q12H  . metronidazole  500 mg Intravenous Q8H  . pantoprazole (PROTONIX) IV  40 mg Intravenous Q12H  . potassium chloride  40 mEq Oral BID  . sodium chloride  3 mL Intravenous Q12H   Continuous Infusions: . sodium chloride 75 mL/hr at 12/30/14 0604       Time spent: 35 minutes    [facility] A  [facility] Pager [phone] If 7PM-7AM, please contact night-coverage at [email or web address], password [facility] 12/30/2014, 1:34 PM  LOS: 2 days

## 2014-12-30 NOTE — Progress Notes (Signed)
[facility] Progress Note Subjective:   Had temp 101.2. Lesss abd pain. No nausea or vomiting. No further rectal bleeding. No dysuria, cough or calf pain.    Objective:  Vital signs in last 24 hours: Temp:  [98.4 F (36.9 C)-101.2 F (38.4 C)] 98.4 F (36.9 C) (05/17 0604) Pulse Rate:  [82-96] 82 (05/16 2027) Resp:  [17-18] 18 (05/17 0408) BP: (121-159)/(73-88) 140/80 mmHg (05/17 0408) SpO2:  [97 %-100 %] 99 % (05/17 0408) Last BM Date: 12/29/14 General:   Alert,  Well-developed, in NAD Heart:  Regular rate and rhythm; no murmurs Pulm;bronchial BS cleared with cough Abdomen:  Soft, mild right sided tenderness, nondistended. Normal bowel sounds, without guarding, and without rebound.   Extremities:  Without edema.calves NT, neg homans, neg thigh/groin cords Neurologic:Alert and  oriented x4;  grossly normal neurologically. Psych: Alert and cooperative. Normal mood and affect.  Intake/Output from previous day: 05/16 0701 - 05/17 0700 In: 2391.3 [P.O.:300; I.V.:1691.3; IV Piggyback:400] Out: 2000 [Urine:2000] Intake/Output this shift:    Lab Results:  Recent Labs  12/28/14 1931  12/29/14 0647 12/29/14 1601 12/30/14 0132 12/30/14 0725  WBC 6.5  --  4.0  --   --  3.6*  HGB 13.9  < > 12.0* 11.9* 11.8* 11.5*  HCT 38.8*  < > 34.3* 34.6* 33.9* 32.7*  PLT 224  --  184  --   --  180  < > = values in this interval not displayed. BMET  Recent Labs  12/28/14 1931 12/29/14 0647  NA 140 138  K 3.4* 3.2*  CL 101 106  CO2 27 26  GLUCOSE 133* 115*  BUN 15 9  CREATININE 1.21 1.17  CALCIUM 9.1 7.9*   LFT  Recent Labs  12/28/14 1931  PROT 6.9  ALBUMIN 4.2  AST 24  ALT 17  ALKPHOS 53  BILITOT 0.5    Ct Abdomen Pelvis W Contrast  12/28/2014   CLINICAL DATA:  Abdominal pain and fever. GI bleed today. Recent colonoscopy.  EXAM: CT ABDOMEN AND PELVIS WITH CONTRAST  TECHNIQUE: Multidetector CT imaging of the abdomen and pelvis was performed using the  standard protocol following bolus administration of intravenous contrast.  CONTRAST:  124mL OMNIPAQUE IOHEXOL 300 MG/ML  SOLN  COMPARISON:  CT 03/24/2011  FINDINGS: Minimal scarring at the right lung base. There is a 10 mm paraesophageal lymph node adjacent to the distal esophagus. Mild distal esophageal thickening.  There is mild hepatic steatosis. No focal hepatic lesion. The gallbladder is decompressed. No biliary dilatation. The spleen, adrenal glands, and pancreas are unremarkable.  There is a punctate nonobstructing stone in the upper right kidney. No hydronephrosis or obstructive uropathy. Kidneys demonstrate symmetric enhancement and excretion.  Stomach is distended with ingested oral contrast. There are no dilated or thickened small bowel loops, no obstruction. There is fluid distending the cecum and ascending colon with equivocal associated wall thickening. Remainder of the colon is decompressed. Scattered distal colonic diverticula without diverticulitis. No free air, free fluid, or intra-abdominal fluid collection. The appendix is normal.  Abdominal aorta is normal in caliber. No retroperitoneal adenopathy.  Within the pelvis the urinary bladder is minimally distended. The prostate gland is normal in size. No pelvic free fluid. No pelvic adenopathy.  There is degenerative change in the lumbar spine with facet arthropathy and degenerative disc disease. Cystic change in the right greater than left femoral head/neck likely osteoarthritis. There are no acute or suspicious osseous abnormalities.  IMPRESSION: 1. Fluid distending the  cecum and ascending colon with equivocal associated wall thickening, may reflect mild colitis. No findings of colonic perforation. 2. Mild thickening of the distal esophagus with a 10 mm paraesophageal lymph node. This can be seen in the setting of esophagitis, however endoscopic evaluation could be considered if there are risk factors for malignancy. 3. Incidental findings of  punctate nonobstructing right renal stone and mild hepatic steatosis.   Electronically Signed   By: Jeb Levering M.D.   On: 12/28/2014 21:10   Dg Abd Acute W/chest  12/28/2014   CLINICAL DATA:  Lower abdominal pain.  Blood in stool for 2 days.  EXAM: DG ABDOMEN ACUTE W/ 1V CHEST  COMPARISON:  03/24/2011  FINDINGS: The lungs appear clear. Cardiac and mediastinal margins appear normal.  No free intraperitoneal gas observed beneath the hemidiaphragms. There is scattered small air-fluid levels in nondilated bowel in the central and right abdomen, primarily in the ascending colon.  No dilated bowel noted.  Transitional L5 morphology.  IMPRESSION: 1. Scattered small air-fluid levels primarily in the ascending colon are noted, and not necessarily abnormal. No dilated bowel to suggest obstruction.   Electronically Signed   By: Van Clines M.D.   On: 12/28/2014 20:37    ASSESSMENT/PLAN:   59 year old male status post colonoscopy on May 13 with cold snare removal of 2 small polyps, admitted May 15 with bright red blood per rectum and lower abdominal cramping. CT with question of mild colitis versus diverticulitis. Currently on Cipro and Flagyl. Had fever last pm. WBC 3.6. BC pending. Pt instructed to take deeps breaths and ambulate today. Will order incentive spirometer. Can try clear liquids.Potassium low, oral replacement ordered.     LOS: 2 days   Hvozdovic, Vita Barley PA-C 12/30/2014, Pager [phone]  GI ATTENDING  Interval history data reviewed. Patient seen and examined. Currently in bathroom Overall improved. Still some pain. Had fever last night. Bowel movement earlier without any significant blood. Unsure the etiology of his presentation. Continue supportive care, monitoring, and empiric antibiotics for now.  Docia Chuck. Geri Seminole., M.D. [facility] Division of Gastroenterology

## 2014-12-30 NOTE — Progress Notes (Signed)
No bloody bowel movements overnight.

## 2014-12-30 NOTE — Progress Notes (Signed)
Patient with temperature of 101.2. Schorr, NP notified. Orders placed.

## 2014-12-31 DIAGNOSIS — K922 Gastrointestinal hemorrhage, unspecified: Principal | ICD-10-CM

## 2014-12-31 DIAGNOSIS — E876 Hypokalemia: Secondary | ICD-10-CM

## 2014-12-31 DIAGNOSIS — K209 Esophagitis, unspecified: Secondary | ICD-10-CM

## 2014-12-31 DIAGNOSIS — K529 Noninfective gastroenteritis and colitis, unspecified: Secondary | ICD-10-CM

## 2014-12-31 LAB — CBC
HCT: 36.2 % — ABNORMAL LOW (ref 39.0–52.0)
HEMOGLOBIN: 12.4 g/dL — AB (ref 13.0–17.0)
MCH: 30.8 pg (ref 26.0–34.0)
MCHC: 34.3 g/dL (ref 30.0–36.0)
MCV: 89.8 fL (ref 78.0–100.0)
PLATELETS: 190 10*3/uL (ref 150–400)
RBC: 4.03 MIL/uL — ABNORMAL LOW (ref 4.22–5.81)
RDW: 12 % (ref 11.5–15.5)
WBC: 2.7 10*3/uL — ABNORMAL LOW (ref 4.0–10.5)

## 2014-12-31 LAB — BASIC METABOLIC PANEL
Anion gap: 5 (ref 5–15)
BUN: 6 mg/dL (ref 6–20)
CO2: 26 mmol/L (ref 22–32)
Calcium: 8.5 mg/dL — ABNORMAL LOW (ref 8.9–10.3)
Chloride: 111 mmol/L (ref 101–111)
Creatinine, Ser: 1.15 mg/dL (ref 0.61–1.24)
GFR calc Af Amer: >60 mL/min (ref >60)
Glucose, Bld: 101 mg/dL — ABNORMAL HIGH (ref 65–99)
Potassium: 4.2 mmol/L (ref 3.5–5.1)
Sodium: 142 mmol/L (ref 135–145)

## 2014-12-31 LAB — GLUCOSE, CAPILLARY: GLUCOSE-CAPILLARY: 106 mg/dL — AB (ref 65–99)

## 2014-12-31 MED ORDER — METRONIDAZOLE 500 MG PO TABS
500.0000 mg | ORAL_TABLET | Freq: Three times a day (TID) | ORAL | Status: DC
  Administered 2014-12-31 – 2015-01-01 (×2): 500 mg via ORAL
  Filled 2014-12-31 (×5): qty 1

## 2014-12-31 MED ORDER — CIPROFLOXACIN HCL 500 MG PO TABS
500.0000 mg | ORAL_TABLET | Freq: Two times a day (BID) | ORAL | Status: DC
  Administered 2014-12-31 – 2015-01-01 (×2): 500 mg via ORAL
  Filled 2014-12-31 (×4): qty 1

## 2014-12-31 NOTE — Progress Notes (Signed)
[facility] PROGRESS NOTE  Erik Nguyen CXK:[id number] DOB: December 05, 1955 DOA: 12/28/2014 PCP: Minerva Ends, MD  Assessment/Plan: #1 lower GI bleed Per wife patient had bleeding prior to colonoscopy and as well he had a colonoscopy. CT with evidence of esophagitis/gastritis. Patient on PPI twice a day. So also bleeding is unclear. Bleeding seems to have subsided. Patient also noted to have a colitis per CT of the abdomen and pelvis. Bleeding may be secondary to colitis versus diverticular bleed versus hemorrhoidal bleed. Patient tolerating current diet. We'll advance to a soft diet. Will change IV antibiotics to oral antibiotics. If patient remains stable hemoglobin remained stable to possibly discharge home tomorrow. GI following and appreciate input and recommendations.  #2 colitis Currently stable. Afebrile. WBC 2.7. Change IV antibiotics to oral antibiotics. Follow.  #3 hypokalemia  likely secondary to GI losses. Repleted.  #4 esophagitis Per CT scan. Continue PPI.  #5 prophylaxis PPI for GI prophylaxis. SCDs for DVT prophylaxis.  Code Status: Full Family Communication: Updated patient and wife at bedside. Disposition Plan: Home when medically stable. Hopefully in the next one to 2 days.   Consultants:  Gastroenterology: Dr. Henrene Pastor 12/29/2014  Procedures:  CT abdomen and pelvis 12/28/2014  Acute abdominal series 12/28/2014  Antibiotics:  IV ciprofloxacin 12/28/2014>>>> 12/31/2014  IV Flagyl 12/28/2014>>>>> 12/31/2014  Oral ciprofloxacin 12/31/2014  Oral Flagyl 12/31/2014  HPI/Subjective: Patient denies any nausea, no vomiting, no diarrhea. Patient denies any further bleeding. Patient states he's tolerating clear liquids for now and asking for solid food.  Objective: Filed Vitals:   12/31/14 1336  BP: 153/92  Pulse: 75  Temp: 97.7 F (36.5 C)  Resp: 16    Intake/Output Summary (Last 24 hours) at 12/31/14 1506 Last data filed at 12/31/14 1337  Gross per 24 hour  Intake 3138.75 ml  Output   5200 ml  Net -2061.25 ml   Filed Weights   12/28/14 1926  Weight: 73.71 kg (162 lb 8 oz)    Exam:   General:  NAD  Cardiovascular: RRR  Respiratory: CTAB  Abdomen: Soft, nontender, nondistended, positive bowel sounds.  Musculoskeletal: No clubbing cyanosis or edema.   Data Reviewed: Basic Metabolic Panel:  Recent Labs Lab 12/28/14 1931 12/29/14 0647 12/31/14 0532  NA 140 138 142  K 3.4* 3.2* 4.2  CL 101 106 111  CO2 27 26 26   GLUCOSE 133* 115* 101*  BUN 15 9 6   CREATININE 1.21 1.17 1.15  CALCIUM 9.1 7.9* 8.5*   Liver Function Tests:  Recent Labs Lab 12/28/14 1931  AST 24  ALT 17  ALKPHOS 53  BILITOT 0.5  PROT 6.9  ALBUMIN 4.2    Recent Labs Lab 12/28/14 1931  LIPASE 20*   No results for input(s): AMMONIA in the last 168 hours. CBC:  Recent Labs Lab 12/28/14 1931  12/29/14 0647 12/29/14 1601 12/30/14 0132 12/30/14 0725 12/31/14 0532  WBC 6.5  --  4.0  --   --  3.6* 2.7*  NEUTROABS 5.5  --   --   --   --   --   --   HGB 13.9  < > 12.0* 11.9* 11.8* 11.5* 12.4*  HCT 38.8*  < > 34.3* 34.6* 33.9* 32.7* 36.2*  MCV 89.4  --  90.0  --   --  89.3 89.8  PLT 224  --  184  --   --  180 190  < > = values in this interval not displayed. Cardiac Enzymes: No results for input(s): CKTOTAL, CKMB, CKMBINDEX, TROPONINI  in the last 168 hours. BNP (last 3 results) No results for input(s): BNP in the last 8760 hours.  ProBNP (last 3 results) No results for input(s): PROBNP in the last 8760 hours.  CBG: No results for input(s): GLUCAP in the last 168 hours.  Recent Results (from the past 240 hour(s))  MRSA PCR Screening     Status: None   Collection Time: 12/28/14 11:50 PM  Result Value Ref Range Status   MRSA by PCR NEGATIVE NEGATIVE Final    Comment:        The GeneXpert MRSA Assay (FDA approved for NASAL specimens only), is one component of a comprehensive MRSA colonization surveillance program.  It is not intended to diagnose MRSA infection nor to guide or monitor treatment for MRSA infections.   Culture, blood (routine x 2)     Status: None (Preliminary result)   Collection Time: 12/30/14  5:40 AM  Result Value Ref Range Status   Specimen Description BLOOD LEFT HAND  Final   Special Requests BOTTLES DRAWN AEROBIC AND ANAEROBIC  10CC EACH  Final   Culture   Final           BLOOD CULTURE RECEIVED NO GROWTH TO DATE CULTURE WILL BE HELD FOR 5 DAYS BEFORE ISSUING A FINAL NEGATIVE REPORT Note: Culture results may be compromised due to an excessive volume of blood received in culture bottles. Performed at [facility]    Report Status PENDING  Incomplete  Culture, blood (routine x 2)     Status: None (Preliminary result)   Collection Time: 12/30/14  5:50 AM  Result Value Ref Range Status   Specimen Description BLOOD LEFT ANTECUBITAL  Final   Special Requests BOTTLES DRAWN AEROBIC AND ANAEROBIC  10CC EACH  Final   Culture   Final           BLOOD CULTURE RECEIVED NO GROWTH TO DATE CULTURE WILL BE HELD FOR 5 DAYS BEFORE ISSUING A FINAL NEGATIVE REPORT Performed at [facility]    Report Status PENDING  Incomplete     Studies: No results found.  Scheduled Meds: . ciprofloxacin  400 mg Intravenous Q12H  . metronidazole  500 mg Intravenous Q8H  . pantoprazole (PROTONIX) IV  40 mg Intravenous Q12H  . sodium chloride  3 mL Intravenous Q12H   Continuous Infusions: . sodium chloride 75 mL/hr at 12/31/14 0320    Principal Problem:   GI bleed Active Problems:   HTN (hypertension)   Colitis   Esophagitis   Lower abdominal pain   Hypokalemia   Hematochezia   Generalized abdominal pain   Abnormal CT of the abdomen    Time spent: 22 minutes    THOMPSON,DANIEL M.D. [facility] Pager [phone]. If 7PM-7AM, please contact night-coverage at [email or web address], password [facility] 12/31/2014, 3:06 PM  LOS: 3 days

## 2014-12-31 NOTE — Progress Notes (Addendum)
     [facility] Progress Note  Subjective:   Afeb, no pain. No further rectal bleeding. Tol clears. Hungry.    Objective:  Vital signs in last 24 hours: Temp:  [98 F (36.7 C)-98.6 F (37 C)] 98.1 F (36.7 C) (05/18 0605) Pulse Rate:  [67-81] 72 (05/18 0605) Resp:  [13-18] 13 (05/18 0605) BP: (133-152)/(80-89) 152/80 mmHg (05/18 0605) SpO2:  [97 %-100 %] 97 % (05/18 0605) Last BM Date: 12/30/14 General:   Alert,  Well-developed,    in NAD Heart:  Regular rate and rhythm; no murmurs Pulm;lungs clear Abdomen:  Soft, nontender and nondistended. Normal bowel sounds, without guarding, and without rebound.   Extremities:  Without edema. Neurologic:Alert and  oriented x4;  grossly normal neurologically. Psych: Alert and cooperative. Normal mood and affect.  Intake/Output from previous day: 05/17 0701 - 05/18 0700 In: 3478.8 [P.O.:1025; I.V.:1553.8; IV Piggyback:900] Out: 4500 [Urine:4500] Intake/Output this shift: Total I/O In: -  Out: 200 [Urine:200]  Lab Results:  Recent Labs  12/29/14 0647  12/30/14 0132 12/30/14 0725 12/31/14 0532  WBC 4.0  --   --  3.6* 2.7*  HGB 12.0*  < > 11.8* 11.5* 12.4*  HCT 34.3*  < > 33.9* 32.7* 36.2*  PLT 184  --   --  180 190  < > = values in this interval not displayed. BMET  Recent Labs  12/28/14 1931 12/29/14 0647 12/31/14 0532  NA 140 138 142  K 3.4* 3.2* 4.2  CL 101 106 111  CO2 27 26 26   GLUCOSE 133* 115* 101*  BUN 15 9 6   CREATININE 1.21 1.17 1.15  CALCIUM 9.1 7.9* 8.5*   LFT  Recent Labs  12/28/14 1931  PROT 6.9  ALBUMIN 4.2  AST 24  ALT 17  ALKPHOS 48  BILITOT 0.5     ASSESSMENT/PLAN:     59 year old male status post colonoscopy on May 13 with cold snare removal of 2 small polyps, admitted May 15 with bright red blood per rectum and lower abdominal cramping. CT with question of mild colitis versus diverticulitis. Currently on Cipro and Flagyl.Afeb now. Tol clears. Will advance diet to  full liquids, and can advance to low residue diet if he tol [place]. Can likely switch to po antibiotics(and continue for 3 days). May be able to go this pm or tomorrow morning if ok with med service.    LOS: 3 days   Hvozdovic, Vita Barley PA-C 12/31/2014, Pager [phone]  GI ATTENDING  Interval history and data reviewed. Patient seen and examined. Ambulating in the halls. Wife at side. Reports no abdominal pain. Moving his bowels easily. No bleeding. Tolerating diet. Labs unremarkable. Anticipate discharge home later today or tomorrow morning. Discharge home on Cipro and Flagyl for 3 days would be reasonable. Discussed with patient and wife. Will sign off.  Docia Chuck. Geri Seminole., M.D. [facility] Division of Gastroenterology  .

## 2014-12-31 NOTE — Progress Notes (Signed)
Patient had soft, medium sized bowel movement - brown, clay-like. No red streaks or blood noted.

## 2015-01-01 DIAGNOSIS — I10 Essential (primary) hypertension: Secondary | ICD-10-CM

## 2015-01-01 DIAGNOSIS — R109 Unspecified abdominal pain: Secondary | ICD-10-CM

## 2015-01-01 LAB — CBC
HCT: 34.3 % — ABNORMAL LOW (ref 39.0–52.0)
HEMOGLOBIN: 12 g/dL — AB (ref 13.0–17.0)
MCH: 31.3 pg (ref 26.0–34.0)
MCHC: 35 g/dL (ref 30.0–36.0)
MCV: 89.3 fL (ref 78.0–100.0)
Platelets: 188 10*3/uL (ref 150–400)
RBC: 3.84 MIL/uL — ABNORMAL LOW (ref 4.22–5.81)
RDW: 12 % (ref 11.5–15.5)
WBC: 3.2 10*3/uL — ABNORMAL LOW (ref 4.0–10.5)

## 2015-01-01 LAB — BASIC METABOLIC PANEL
Anion gap: 6 (ref 5–15)
BUN: 8 mg/dL (ref 6–20)
CALCIUM: 8.3 mg/dL — AB (ref 8.9–10.3)
CO2: 25 mmol/L (ref 22–32)
Chloride: 109 mmol/L (ref 101–111)
Creatinine, Ser: 1.16 mg/dL (ref 0.61–1.24)
GFR calc Af Amer: >60 mL/min (ref >60)
GFR calc non Af Amer: >60 mL/min (ref >60)
GLUCOSE: 111 mg/dL — AB (ref 65–99)
Potassium: 4 mmol/L (ref 3.5–5.1)
Sodium: 140 mmol/L (ref 135–145)

## 2015-01-01 MED ORDER — PANTOPRAZOLE SODIUM 40 MG PO TBEC: 40.0000 mg | DELAYED_RELEASE_TABLET | Freq: Every day | ORAL | Status: DC

## 2015-01-01 MED ORDER — CIPROFLOXACIN HCL 500 MG PO TABS: 500.0000 mg | ORAL_TABLET | Freq: Two times a day (BID) | ORAL | Status: AC

## 2015-01-01 MED ORDER — METRONIDAZOLE 500 MG PO TABS: 500.0000 mg | ORAL_TABLET | Freq: Three times a day (TID) | ORAL | Status: DC

## 2015-01-01 NOTE — Progress Notes (Signed)
Nsg Discharge Note  Admit Date:  12/28/2014 Discharge date: 01/01/2015   Erik Nguyen to be D/C'd Home per MD order.  AVS completed.  Copy for chart, and copy for patient signed, and dated. Patient/caregiver able to verbalize understanding.  Discharge Medication:   Medication List    TAKE these medications        ciprofloxacin 500 MG tablet  Commonly known as:  CIPRO  Take 1 tablet (500 mg total) by mouth 2 (two) times daily. Take for 4 days then stop.     lisinopril-hydrochlorothiazide 10-12.5 MG per tablet  Commonly known as:  PRINZIDE  Take 1 tablet by mouth daily.     metroNIDAZOLE 500 MG tablet  Commonly known as:  FLAGYL  Take 1 tablet (500 mg total) by mouth every 8 (eight) hours. Take for 4 days then stop.     pantoprazole 40 MG tablet  Commonly known as:  PROTONIX  Take 1 tablet (40 mg total) by mouth daily.        Discharge Assessment: Filed Vitals:   01/01/15 0900  BP: 148/89  Pulse: 69  Temp: 98.6 F (37 C)  Resp: 20   Skin clean, dry and intact without evidence of skin break down, no evidence of skin tears noted. IV catheter discontinued intact. Site without signs and symptoms of complications - no redness or edema noted at insertion site, patient denies c/o pain - only slight tenderness at site.  Dressing with slight pressure applied.  D/c Instructions-Education: Discharge instructions given to patient/family with verbalized understanding. D/c education completed with patient/family including follow up instructions, medication list, d/c activities limitations if indicated, with other d/c instructions as indicated by MD - patient able to verbalize understanding, all questions fully answered. Patient instructed to return to ED, call 911, or call MD for any changes in condition.  Patient escorted via [place], and D/C home via private auto.  Dayle Points, RN 01/01/2015 11:57 AM

## 2015-01-01 NOTE — Progress Notes (Signed)
Utilization Review completed. Ledell Codrington RN BSN CM 

## 2015-01-01 NOTE — Discharge Summary (Signed)
Physician Discharge Summary  Erik Nguyen [name] DOB: [birth year] DOA: 12/28/2014  PCP: Minerva Ends, MD  Admit date: 12/28/2014 Discharge date: 01/01/2015  Time spent: 65 minutes  Recommendations for Outpatient Follow-up:  1. Follow-up with Minerva Ends, MD in 1-2 week weeks. Follow-up patient will need a basic metabolic profile done to follow-up on electrolytes and renal function. Patient also need a CBC done to follow-up on his hemoglobin.  Discharge Diagnoses:  Principal Problem:   GI bleed Active Problems:   HTN (hypertension)   Colitis   Esophagitis   Lower abdominal pain   Hypokalemia   Hematochezia   Generalized abdominal pain   Abnormal CT of the abdomen   Discharge Condition: Stable and improved  Diet recommendation: Regular  Filed Weights   12/28/14 1926  Weight: 73.71 kg (162 lb 8 oz)    History of present illness:  Per Dr. Freda Munro Koeller is a 59 y.o. male with a history of HTN who presented to the ED with complaints of lower ABD pain and (BRBPR) rectal bleeding since 3 pm on day of admission. He denied any nausea or vomiting. He did report fever and chills. He had a colonoscopy performred on 05/13 by Dr Deatra Ina and a biopsy and polypectomy were performed. In the ED his initial hemoglobin was 13.9. A ct scan of the ABD was performed and revealed findings consistent with Esophagitis, and Colitis, and a right non-obstructing renal stone was incidentally seen. The EDP consulted GI: Dr Collene Mares who will see the patient in the AM.    Hospital Course:  #1 lower GI bleed Per wife patient had bleeding prior to colonoscopy and as well he had a colonoscopy. CT with evidence of esophagitis/gastritis. Patient on PPI twice a day. Source of bleeding is unclear. Bleeding seemed   to have subsided. Patient also noted to have a colitis per CT of the abdomen and pelvis. Bleeding may be secondary to colitis versus diverticular bleed versus hemorrhoidal  bleed. Patient was seen in consultation by gastroenterology and followed throughout the hospitalization. Patient improved clinically and initially placed on clear liquids which was advanced to full liquids as well as a soft diet. Patient's hemoglobin remained stable throughout the hospitalization. Patient had no further bleeding. Diet was advanced to a soft diet which he tolerated. Patient will be discharged home in stable and improved condition.   #2 colitis On admission CT of the abdomen and pelvis which was done was consistent with a colitis. Patient was noted to have a slow white count. Patient was placed empirically on IV ciprofloxacin and IV Flagyl. Patient tolerated Cipro and Flagyl and improved clinically. Patient was started on a diet which was advanced to a soft diet which he tolerated. Patient remained afebrile. IV antibiotics were subsequently transitioned to oral antibiotics. Patient will be discharged home on 4 more days of oral antibiotics to complete a course of antibiotic therapy.   #3 hypokalemia  likely secondary to GI losses. Repleted.  #4 esophagitis Per CT scan. Patient was placed on a PPI and will be discharged home on the PPI once daily. Outpatient follow-up.   Procedures:  CT abdomen and pelvis 12/28/2014  Acute abdominal series 12/28/2014    Consultations:  Gastroenterology: Dr. Henrene Pastor 12/29/2014    Discharge Exam: Filed Vitals:   01/01/15 0900  BP: 148/89  Pulse: 69  Temp: 98.6 F (37 C)  Resp: 20    General: NAD Cardiovascular: RRR Respiratory: CTAB  Discharge Instructions   Discharge Instructions  Diet general    Complete by:  As directed   Soft/low residue diet     Discharge instructions    Complete by:  As directed   Follow up with Minerva Ends, MD in 1 week.     Increase activity slowly    Complete by:  As directed           Current Discharge Medication List    START taking these medications   Details  ciprofloxacin  (CIPRO) 500 MG tablet Take 1 tablet (500 mg total) by mouth 2 (two) times daily. Take for 4 days then stop. Qty: 8 tablet, Refills: 0    metroNIDAZOLE (FLAGYL) 500 MG tablet Take 1 tablet (500 mg total) by mouth every 8 (eight) hours. Take for 4 days then stop. Qty: 12 tablet, Refills: 0    pantoprazole (PROTONIX) 40 MG tablet Take 1 tablet (40 mg total) by mouth daily. Qty: 30 tablet, Refills: 0      CONTINUE these medications which have NOT CHANGED   Details  lisinopril-hydrochlorothiazide (PRINZIDE) 10-12.5 MG per tablet Take 1 tablet by mouth daily. Qty: 30 tablet, Refills: 11   Associated Diagnoses: Essential hypertension       Allergies  Allergen Reactions  . Latex Itching and Rash   Follow-up Information    Follow up with Minerva Ends, MD. Schedule an appointment as soon as possible for a visit on 01/15/2015.   Specialty:  Family Medicine   Why:  Appointment with Dr. Adrian Blackwater is on 01/15/15 at 3:30   Contact information:   [address] [place] [address] [phone]        The results of significant diagnostics from this hospitalization (including imaging, microbiology, ancillary and laboratory) are listed below for reference.    Significant Diagnostic Studies: Ct Abdomen Pelvis W Contrast  12/28/2014   CLINICAL DATA:  Abdominal pain and fever. GI bleed today. Recent colonoscopy.  EXAM: CT ABDOMEN AND PELVIS WITH CONTRAST  TECHNIQUE: Multidetector CT imaging of the abdomen and pelvis was performed using the standard protocol following bolus administration of intravenous contrast.  CONTRAST:  171mL OMNIPAQUE IOHEXOL 300 MG/ML  SOLN  COMPARISON:  CT 03/24/2011  FINDINGS: Minimal scarring at the right lung base. There is a 10 mm paraesophageal lymph node adjacent to the distal esophagus. Mild distal esophageal thickening.  There is mild hepatic steatosis. No focal hepatic lesion. The gallbladder is decompressed. No biliary dilatation. The spleen, adrenal glands,  and pancreas are unremarkable.  There is a punctate nonobstructing stone in the upper right kidney. No hydronephrosis or obstructive uropathy. Kidneys demonstrate symmetric enhancement and excretion.  Stomach is distended with ingested oral contrast. There are no dilated or thickened small bowel loops, no obstruction. There is fluid distending the cecum and ascending colon with equivocal associated wall thickening. Remainder of the colon is decompressed. Scattered distal colonic diverticula without diverticulitis. No free air, free fluid, or intra-abdominal fluid collection. The appendix is normal.  Abdominal aorta is normal in caliber. No retroperitoneal adenopathy.  Within the pelvis the urinary bladder is minimally distended. The prostate gland is normal in size. No pelvic free fluid. No pelvic adenopathy.  There is degenerative change in the lumbar spine with facet arthropathy and degenerative disc disease. Cystic change in the right greater than left femoral head/neck likely osteoarthritis. There are no acute or suspicious osseous abnormalities.  IMPRESSION: 1. Fluid distending the cecum and ascending colon with equivocal associated wall thickening, may reflect mild colitis. No findings of  colonic perforation. 2. Mild thickening of the distal esophagus with a 10 mm paraesophageal lymph node. This can be seen in the setting of esophagitis, however endoscopic evaluation could be considered if there are risk factors for malignancy. 3. Incidental findings of punctate nonobstructing right renal stone and mild hepatic steatosis.   Electronically Signed   By: Jeb Levering M.D.   On: 12/28/2014 21:10   Dg Abd Acute W/chest  12/28/2014   CLINICAL DATA:  Lower abdominal pain.  Blood in stool for 2 days.  EXAM: DG ABDOMEN ACUTE W/ 1V CHEST  COMPARISON:  03/24/2011  FINDINGS: The lungs appear clear. Cardiac and mediastinal margins appear normal.  No free intraperitoneal gas observed beneath the hemidiaphragms. There  is scattered small air-fluid levels in nondilated bowel in the central and right abdomen, primarily in the ascending colon.  No dilated bowel noted.  Transitional L5 morphology.  IMPRESSION: 1. Scattered small air-fluid levels primarily in the ascending colon are noted, and not necessarily abnormal. No dilated bowel to suggest obstruction.   Electronically Signed   By: Van Clines M.D.   On: 12/28/2014 20:37    Microbiology: Recent Results (from the past 240 hour(s))  MRSA PCR Screening     Status: None   Collection Time: 12/28/14 11:50 PM  Result Value Ref Range Status   MRSA by PCR NEGATIVE NEGATIVE Final    Comment:        The GeneXpert MRSA Assay (FDA approved for NASAL specimens only), is one component of a comprehensive MRSA colonization surveillance program. It is not intended to diagnose MRSA infection nor to guide or monitor treatment for MRSA infections.   Culture, blood (routine x 2)     Status: None (Preliminary result)   Collection Time: 12/30/14  5:40 AM  Result Value Ref Range Status   Specimen Description BLOOD LEFT HAND  Final   Special Requests BOTTLES DRAWN AEROBIC AND ANAEROBIC  10CC EACH  Final   Culture   Final           BLOOD CULTURE RECEIVED NO GROWTH TO DATE CULTURE WILL BE HELD FOR 5 DAYS BEFORE ISSUING A FINAL NEGATIVE REPORT Note: Culture results may be compromised due to an excessive volume of blood received in culture bottles. Performed at [facility]    Report Status PENDING  Incomplete  Culture, blood (routine x 2)     Status: None (Preliminary result)   Collection Time: 12/30/14  5:50 AM  Result Value Ref Range Status   Specimen Description BLOOD LEFT ANTECUBITAL  Final   Special Requests BOTTLES DRAWN AEROBIC AND ANAEROBIC  10CC EACH  Final   Culture   Final           BLOOD CULTURE RECEIVED NO GROWTH TO DATE CULTURE WILL BE HELD FOR 5 DAYS BEFORE ISSUING A FINAL NEGATIVE REPORT Performed at [facility]    Report Status  PENDING  Incomplete     Labs: Basic Metabolic Panel:  Recent Labs Lab 12/28/14 1931 12/29/14 0647 12/31/14 0532 01/01/15 0557  NA 140 138 142 140  K 3.4* 3.2* 4.2 4.0  CL 101 106 111 109  CO2 27 26 26 25   GLUCOSE 133* 115* 101* 111*  BUN 15 9 6 8   CREATININE 1.21 1.17 1.15 1.16  CALCIUM 9.1 7.9* 8.5* 8.3*   Liver Function Tests:  Recent Labs Lab 12/28/14 1931  AST 24  ALT 17  ALKPHOS 53  BILITOT 0.5  PROT 6.9  ALBUMIN 4.2  Recent Labs Lab 12/28/14 1931  LIPASE 20*   No results for input(s): AMMONIA in the last 168 hours. CBC:  Recent Labs Lab 12/28/14 1931  12/29/14 0647 12/29/14 1601 12/30/14 0132 12/30/14 0725 12/31/14 0532 01/01/15 0557  WBC 6.5  --  4.0  --   --  3.6* 2.7* 3.2*  NEUTROABS 5.5  --   --   --   --   --   --   --   HGB 13.9  < > 12.0* 11.9* 11.8* 11.5* 12.4* 12.0*  HCT 38.8*  < > 34.3* 34.6* 33.9* 32.7* 36.2* 34.3*  MCV 89.4  --  90.0  --   --  89.3 89.8 89.3  PLT 224  --  184  --   --  180 190 188  < > = values in this interval not displayed. Cardiac Enzymes: No results for input(s): CKTOTAL, CKMB, CKMBINDEX, TROPONINI in the last 168 hours. BNP: BNP (last 3 results) No results for input(s): BNP in the last 8760 hours.  ProBNP (last 3 results) No results for input(s): PROBNP in the last 8760 hours.  CBG:  Recent Labs Lab 12/31/14 2102  GLUCAP 106*       Signed:  THOMPSON,DANIEL MD [facility] 01/01/2015, 11:05 AM

## 2015-01-01 NOTE — Progress Notes (Signed)
Pt follows at [facility] with Dr Adrian Blackwater. Will resume care after discharge.

## 2015-01-05 LAB — CULTURE, BLOOD (ROUTINE X 2)
Culture: NO GROWTH
Culture: NO GROWTH

## 2015-01-06 ENCOUNTER — Encounter: Payer: Self-pay | Admitting: Gastroenterology

## 2015-01-09 ENCOUNTER — Telehealth: Payer: Self-pay | Admitting: *Deleted

## 2015-01-09 NOTE — Telephone Encounter (Signed)
Left voice message to return call 

## 2015-01-09 NOTE — Telephone Encounter (Signed)
-----   Message from Boykin Nearing, MD sent at 01/05/2015  9:13 AM EDT ----- Blood cultures negative

## 2015-01-15 ENCOUNTER — Ambulatory Visit: Payer: No Typology Code available for payment source | Attending: Family Medicine | Admitting: Family Medicine

## 2015-01-15 ENCOUNTER — Encounter: Payer: Self-pay | Admitting: Family Medicine

## 2015-01-15 VITALS — BP 132/87 | HR 72 | Temp 98.5°F | Resp 16 | Ht 66.0 in | Wt 158.0 lb

## 2015-01-15 DIAGNOSIS — Z09 Encounter for follow-up examination after completed treatment for conditions other than malignant neoplasm: Secondary | ICD-10-CM | POA: Insufficient documentation

## 2015-01-15 DIAGNOSIS — M79631 Pain in right forearm: Secondary | ICD-10-CM | POA: Insufficient documentation

## 2015-01-15 DIAGNOSIS — K089 Disorder of teeth and supporting structures, unspecified: Secondary | ICD-10-CM | POA: Insufficient documentation

## 2015-01-15 DIAGNOSIS — Y838 Other surgical procedures as the cause of abnormal reaction of the patient, or of later complication, without mention of misadventure at the time of the procedure: Secondary | ICD-10-CM | POA: Insufficient documentation

## 2015-01-15 DIAGNOSIS — Z86010 Personal history of colonic polyps: Secondary | ICD-10-CM | POA: Insufficient documentation

## 2015-01-15 DIAGNOSIS — K529 Noninfective gastroenteritis and colitis, unspecified: Secondary | ICD-10-CM

## 2015-01-15 DIAGNOSIS — S5491XD Injury of unspecified nerve at forearm level, right arm, subsequent encounter: Secondary | ICD-10-CM | POA: Insufficient documentation

## 2015-01-15 DIAGNOSIS — K922 Gastrointestinal hemorrhage, unspecified: Secondary | ICD-10-CM

## 2015-01-15 DIAGNOSIS — Z87891 Personal history of nicotine dependence: Secondary | ICD-10-CM | POA: Insufficient documentation

## 2015-01-15 DIAGNOSIS — T148 Other injury of unspecified body region: Secondary | ICD-10-CM

## 2015-01-15 DIAGNOSIS — T148XXA Other injury of unspecified body region, initial encounter: Secondary | ICD-10-CM | POA: Insufficient documentation

## 2015-01-15 DIAGNOSIS — H547 Unspecified visual loss: Secondary | ICD-10-CM | POA: Insufficient documentation

## 2015-01-15 DIAGNOSIS — M791 Myalgia: Secondary | ICD-10-CM | POA: Insufficient documentation

## 2015-01-15 DIAGNOSIS — K088 Other specified disorders of teeth and supporting structures: Secondary | ICD-10-CM

## 2015-01-15 LAB — CBC
HCT: 42.5 % (ref 39.0–52.0)
Hemoglobin: 14.6 g/dL (ref 13.0–17.0)
MCH: 31.6 pg (ref 26.0–34.0)
MCHC: 34.4 g/dL (ref 30.0–36.0)
MCV: 92 fL (ref 78.0–100.0)
MPV: 9.3 fL (ref 8.6–12.4)
PLATELETS: 349 10*3/uL (ref 150–400)
RBC: 4.62 MIL/uL (ref 4.22–5.81)
RDW: 13.4 % (ref 11.5–15.5)
WBC: 5.1 10*3/uL (ref 4.0–10.5)

## 2015-01-15 NOTE — Assessment & Plan Note (Signed)
Optometry referral placed.

## 2015-01-15 NOTE — Assessment & Plan Note (Signed)
Gi bleed: resolved. Repeat CBC today

## 2015-01-15 NOTE — Assessment & Plan Note (Signed)
Dental referral placed.

## 2015-01-15 NOTE — Patient Instructions (Signed)
Erik Nguyen,  Thank you for coming in today  1. Gi bleed: resolved. Repeat CBC today  2. Referrals: dental and vision placed  F/u in 6 months for hypertension  Dr. Adrian Blackwater

## 2015-01-15 NOTE — Progress Notes (Signed)
HFU GI Problems  complaining numbness on rt forearm

## 2015-01-15 NOTE — Progress Notes (Signed)
   Subjective:    Patient ID: Erik Nguyen, male    DOB: 10/05/1955, 59 y.o.   MRN: [id number] CC: HFU GI bleed  HPI  1. GI bleed: following colonoscopy with poly removal. No active bleeding now. No pain in rectum or abdomen. No fever or chills.  2. R forearm pain: sharp pains and tingling following IV insertion while in the hospital. No fever or skin rash. Pains are exacerbated by extension of elbow and gripping.   3. Dental and vision: patient requesting referral. Has poor vision. Has not seen a dentist in many years. No dental swelling or bleeding.   Soc Hx: former smoker   Review of Systems  Constitutional: Negative for fever and chills.  Gastrointestinal: Negative for blood in stool.  Musculoskeletal: Positive for myalgias.      Objective:   Physical Exam BP 132/87 mmHg  Pulse 72  Temp(Src) 98.5 F (36.9 C) (Oral)  Resp 16  Ht 5\' 6"  (1.676 m)  Wt 158 lb (71.668 kg)  BMI 25.51 kg/m2  SpO2 100% General appearance: alert, cooperative and no distress Lungs: normal WOB  Abdomen: soft, non-tender; bowel sounds normal; no masses,  no organomegaly Extremities: extremities normal, atraumatic, no cyanosis or edema     Assessment & Plan:

## 2015-01-15 NOTE — Assessment & Plan Note (Signed)
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

## 2015-01-16 LAB — BASIC METABOLIC PANEL
BUN: 15 mg/dL (ref 6–23)
CALCIUM: 9.5 mg/dL (ref 8.4–10.5)
CO2: 30 mEq/L (ref 19–32)
Chloride: 104 mEq/L (ref 96–112)
Creat: 1.02 mg/dL (ref 0.50–1.35)
Glucose, Bld: 86 mg/dL (ref 70–99)
Potassium: 5 mEq/L (ref 3.5–5.3)
Sodium: 144 mEq/L (ref 135–145)

## 2015-01-19 ENCOUNTER — Ambulatory Visit: Payer: Self-pay | Admitting: Physician Assistant

## 2015-01-27 ENCOUNTER — Telehealth: Payer: Self-pay | Admitting: *Deleted

## 2015-01-27 NOTE — Telephone Encounter (Signed)
-----   Message from Boykin Nearing, MD sent at 01/16/2015  8:33 AM EDT ----- CBC normal with Hgb of 14

## 2015-01-27 NOTE — Telephone Encounter (Signed)
Pt aware of results 

## 2015-03-12 ENCOUNTER — Ambulatory Visit: Payer: Self-pay | Attending: Family Medicine

## 2015-03-12 ENCOUNTER — Encounter: Payer: Self-pay | Admitting: Family Medicine

## 2015-03-12 ENCOUNTER — Ambulatory Visit (HOSPITAL_COMMUNITY)
Admission: RE | Admit: 2015-03-12 | Discharge: 2015-03-12 | Disposition: A | Discharge status: Home / Self Care | Payer: Self-pay | Source: Ambulatory Visit | Attending: Family Medicine | Admitting: Family Medicine

## 2015-03-12 ENCOUNTER — Other Ambulatory Visit: Payer: Self-pay

## 2015-03-12 ENCOUNTER — Ambulatory Visit (HOSPITAL_BASED_OUTPATIENT_CLINIC_OR_DEPARTMENT_OTHER): Payer: Self-pay | Admitting: Family Medicine

## 2015-03-12 VITALS — BP 116/78 | HR 71 | Temp 97.9°F | Resp 16 | Wt 161.0 lb

## 2015-03-12 DIAGNOSIS — J309 Allergic rhinitis, unspecified: Secondary | ICD-10-CM

## 2015-03-12 DIAGNOSIS — Z87891 Personal history of nicotine dependence: Secondary | ICD-10-CM | POA: Insufficient documentation

## 2015-03-12 DIAGNOSIS — M545 Low back pain: Secondary | ICD-10-CM

## 2015-03-12 DIAGNOSIS — N4830 Priapism, unspecified: Secondary | ICD-10-CM | POA: Insufficient documentation

## 2015-03-12 DIAGNOSIS — R0789 Other chest pain: Secondary | ICD-10-CM

## 2015-03-12 DIAGNOSIS — M546 Pain in thoracic spine: Secondary | ICD-10-CM | POA: Insufficient documentation

## 2015-03-12 DIAGNOSIS — M62838 Other muscle spasm: Secondary | ICD-10-CM | POA: Insufficient documentation

## 2015-03-12 DIAGNOSIS — I10 Essential (primary) hypertension: Secondary | ICD-10-CM | POA: Insufficient documentation

## 2015-03-12 DIAGNOSIS — J9811 Atelectasis: Secondary | ICD-10-CM | POA: Insufficient documentation

## 2015-03-12 MED ORDER — NAPROXEN 500 MG PO TABS: 500.0000 mg | ORAL_TABLET | Freq: Two times a day (BID) | ORAL | Status: DC

## 2015-03-12 MED ORDER — CYCLOBENZAPRINE HCL 10 MG PO TABS: 10.0000 mg | ORAL_TABLET | Freq: Three times a day (TID) | ORAL | Status: DC | PRN

## 2015-03-12 MED ORDER — TRAMADOL HCL 50 MG PO TABS: 50.0000 mg | ORAL_TABLET | Freq: Three times a day (TID) | ORAL | Status: DC | PRN

## 2015-03-12 MED ORDER — IBUPROFEN 200 MG PO TABS
600.0000 mg | ORAL_TABLET | Freq: Once | ORAL | Status: AC
  Administered 2015-03-12: 600 mg via ORAL

## 2015-03-12 MED ORDER — CETIRIZINE HCL 10 MG PO TABS: 10.0000 mg | ORAL_TABLET | Freq: Every day | ORAL | Status: DC

## 2015-03-12 NOTE — Assessment & Plan Note (Signed)
Chest pressure with sneezing and back pain: Muscle spasm suspected  EKG done today is   Plan: Antiinflammatory- naproxen twice daily with food for next 5-7 days Flexeril to relax muscle, do not take and drive  Tramadol for severe pain  Further evaluation-chest x-ray

## 2015-03-12 NOTE — Assessment & Plan Note (Signed)
Allergic rhinitis causing sneezing: Zyrtec 10 mg once daily

## 2015-03-12 NOTE — Patient Instructions (Signed)
Mr. Kann,  Thank you for coming in today  1. Chest pressure with sneezing and back pain: Muscle spasm suspected  EKG done today is   Plan: Antiinflammatory- naproxen twice daily with food for next 5-7 days Flexeril to relax muscle, do not take and drive  Tramadol for severe pain  Further evaluation-chest x-ray  2. Allergic rhinitis causing sneezing: Zyrtec 10 mg once daily   If you develop severe chest pain or pressure with shortness of breath, fatigue or sweating please go to the ED  F/u in 3 weeks with me for back and chest pain   Dr. Adrian Blackwater

## 2015-03-12 NOTE — Progress Notes (Signed)
Subjective:    Patient ID: Erik Nguyen, male    DOB: 02-Nov-1955, 59 y.o.   MRN: [id number] CC: chest pressure, L lower chest and low back pain   HPI 59 yo M with hx of HTN and priapism   1. Chest pressure: L sided. X 4 days. Exacerbated by sneezing. No heavy lifting or straining recently. No associated SOB, nausea, emesis, weakness or fatigue.   2. L lower chest and back pain: started this AM after a forceful sneeze. Constant. In L lower chest radiating back to L lower thoracolumbar back. No treatment yet. Pain is exacerbated by movement and deep breathing.   History  Substance Use Topics  . Smoking status: Former Smoker -- 1.00 packs/day    Types: Cigarettes  . Smokeless tobacco: Never Used  . Alcohol Use: No     Comment: last use Jan 2015    Review of Systems  Constitutional: Negative for fever, chills, fatigue and unexpected weight change.  HENT: Positive for sneezing.   Eyes: Negative for visual disturbance.  Respiratory: Negative for cough, chest tightness and shortness of breath.   Cardiovascular: Positive for chest pain. Negative for palpitations and leg swelling.  Gastrointestinal: Negative for nausea, vomiting, abdominal pain, diarrhea, constipation and blood in stool.  Musculoskeletal: Positive for myalgias and back pain. Negative for arthralgias, gait problem and neck pain.  Skin: Negative for rash.      Objective:   Physical Exam BP 116/78 mmHg  Pulse 71  Temp(Src) 97.9 F (36.6 C) (Oral)  Resp 16  Wt 161 lb (73.029 kg)  SpO2 98% General appearance: alert, cooperative and no distress  Eyes: mild conjunctival erythema, pterygium L eye medially Ears: normal TM's and external ear canals both ears Nose: no discharge, turbinates pink, swollen Throat: lips, mucosa, and tongue normal; teeth and gums normal Back: symmetric, TTP L lower paraspinal muscles, no CVA tenderness  Lungs: clear to auscultation bilaterally Heart: regular rate and rhythm, S1, S2 normal, no  murmur, click, rub or gallop Abdomen: soft, flat, TTP LUQ and left lower chest wall, without mass, rebound or guarding    Ibuprofen 600 mg PO x one EKG: normal EKG, normal sinus rhythm.HR 67 bpm.       Assessment & Plan:  Erik Nguyen was seen today for back pain and chest pain.  Diagnoses and all orders for this visit:  Left-sided thoracic back pain Orders: -     DG Chest 2 View; Future -     cyclobenzaprine (FLEXERIL) 10 MG tablet; Take 1 tablet (10 mg total) by mouth 3 (three) times daily as needed for muscle spasms. -     naproxen (NAPROSYN) 500 MG tablet; Take 1 tablet (500 mg total) by mouth 2 (two) times daily with a meal. -     traMADol (ULTRAM) 50 MG tablet; Take 1 tablet (50 mg total) by mouth every 8 (eight) hours as needed for severe pain.  Chest wall pain Orders: -     DG Chest 2 View; Future -     cyclobenzaprine (FLEXERIL) 10 MG tablet; Take 1 tablet (10 mg total) by mouth 3 (three) times daily as needed for muscle spasms. -     naproxen (NAPROSYN) 500 MG tablet; Take 1 tablet (500 mg total) by mouth 2 (two) times daily with a meal. -     traMADol (ULTRAM) 50 MG tablet; Take 1 tablet (50 mg total) by mouth every 8 (eight) hours as needed for severe pain.  Allergic rhinitis, unspecified allergic rhinitis  type Orders: -     cetirizine (ZYRTEC) 10 MG tablet; Take 1 tablet (10 mg total) by mouth daily.

## 2015-03-12 NOTE — Progress Notes (Signed)
Complaining of back pain lt lower area x 4 days  No Injury, no problem urinating

## 2015-03-23 ENCOUNTER — Encounter: Payer: Self-pay | Admitting: Family Medicine

## 2015-05-31 ENCOUNTER — Emergency Department (HOSPITAL_COMMUNITY)
Admission: EM | Admit: 2015-05-31 | Discharge: 2015-05-31 | Disposition: A | Discharge status: Home / Self Care | Payer: Self-pay | Attending: Emergency Medicine | Admitting: Emergency Medicine

## 2015-05-31 ENCOUNTER — Encounter (HOSPITAL_COMMUNITY): Payer: Self-pay | Admitting: *Deleted

## 2015-05-31 DIAGNOSIS — M25519 Pain in unspecified shoulder: Secondary | ICD-10-CM

## 2015-05-31 DIAGNOSIS — Z91040 Latex allergy status: Secondary | ICD-10-CM | POA: Insufficient documentation

## 2015-05-31 DIAGNOSIS — Z791 Long term (current) use of non-steroidal anti-inflammatories (NSAID): Secondary | ICD-10-CM | POA: Insufficient documentation

## 2015-05-31 DIAGNOSIS — Z79899 Other long term (current) drug therapy: Secondary | ICD-10-CM | POA: Insufficient documentation

## 2015-05-31 DIAGNOSIS — M25511 Pain in right shoulder: Secondary | ICD-10-CM | POA: Insufficient documentation

## 2015-05-31 DIAGNOSIS — M542 Cervicalgia: Secondary | ICD-10-CM | POA: Insufficient documentation

## 2015-05-31 DIAGNOSIS — Z87891 Personal history of nicotine dependence: Secondary | ICD-10-CM | POA: Insufficient documentation

## 2015-05-31 DIAGNOSIS — I10 Essential (primary) hypertension: Secondary | ICD-10-CM | POA: Insufficient documentation

## 2015-05-31 DIAGNOSIS — Z87438 Personal history of other diseases of male genital organs: Secondary | ICD-10-CM | POA: Insufficient documentation

## 2015-05-31 MED ORDER — IBUPROFEN 600 MG PO TABS: 600.0000 mg | ORAL_TABLET | Freq: Four times a day (QID) | ORAL | Status: DC | PRN

## 2015-05-31 MED ORDER — KETOROLAC TROMETHAMINE 60 MG/2ML IM SOLN
60.0000 mg | Freq: Once | INTRAMUSCULAR | Status: AC
  Administered 2015-05-31: 60 mg via INTRAMUSCULAR
  Filled 2015-05-31: qty 2

## 2015-05-31 MED ORDER — DIAZEPAM 5 MG PO TABS
5.0000 mg | ORAL_TABLET | Freq: Once | ORAL | Status: AC
  Administered 2015-05-31: 5 mg via ORAL
  Filled 2015-05-31: qty 1

## 2015-05-31 NOTE — Discharge Instructions (Signed)
There is not appear to be an emergent cause for your symptoms at this time. Please follow-up with your PCP for further evaluation and management of your symptoms. Take Motrin as prescribed for your discomfort. Return to ED for worsening symptoms.  Musculoskeletal Pain Musculoskeletal pain is muscle and boney aches and pains. These pains can occur in any part of the body. Your caregiver may treat you without knowing the cause of the pain. They may treat you if blood or urine tests, X-rays, and other tests were normal.  CAUSES There is often not a definite cause or reason for these pains. These pains may be caused by a type of germ (virus). The discomfort may also come from overuse. Overuse includes working out too hard when your body is not fit. Boney aches also come from weather changes. Bone is sensitive to atmospheric pressure changes. HOME CARE INSTRUCTIONS   Ask when your test results will be ready. Make sure you get your test results.  Only take over-the-counter or prescription medicines for pain, discomfort, or fever as directed by your caregiver. If you were given medications for your condition, do not drive, operate machinery or power tools, or sign legal documents for 24 hours. Do not drink alcohol. Do not take sleeping pills or other medications that may interfere with treatment.  Continue all activities unless the activities cause more pain. When the pain lessens, slowly resume normal activities. Gradually increase the intensity and duration of the activities or exercise.  During periods of severe pain, bed rest may be helpful. Lay or sit in any position that is comfortable.  Putting ice on the injured area.  Put ice in a bag.  Place a towel between your skin and the bag.  Leave the ice on for 15 to 20 minutes, 3 to 4 times a day.  Follow up with your caregiver for continued problems and no reason can be found for the pain. If the pain becomes worse or does not go away, it may be  necessary to repeat tests or do additional testing. Your caregiver may need to look further for a possible cause. SEEK IMMEDIATE MEDICAL CARE IF:  You have pain that is getting worse and is not relieved by medications.  You develop chest pain that is associated with shortness or breath, sweating, feeling sick to your stomach (nauseous), or throw up (vomit).  Your pain becomes localized to the abdomen.  You develop any new symptoms that seem different or that concern you. MAKE SURE YOU:   Understand these instructions.  Will watch your condition.  Will get help right away if you are not doing well or get worse.   This information is not intended to replace advice given to you by your health care provider. Make sure you discuss any questions you have with your health care provider.   Document Released: 08/01/2005 Document Revised: 10/24/2011 Document Reviewed: 04/05/2013 Elsevier Interactive Patient Education [employer].

## 2015-05-31 NOTE — ED Notes (Signed)
Pt reports pain on right side of neck and radiates down into clavicle and shoulder area x 1 week. Pain increases with movement. Denies any injury.

## 2015-05-31 NOTE — ED Provider Notes (Signed)
CSN: [id number]     Arrival date & time 05/31/15  1208 History   First MD Initiated Contact with Patient 05/31/15 1302     Chief Complaint  Patient presents with  . Neck Pain  . Shoulder Pain     (Consider location/radiation/quality/duration/timing/severity/associated sxs/prior Treatment) HPI Erik Nguyen is a 59 y.o. male who comes in for evaluation of right neck and shoulder pain. Patient states on Monday morning he woke up with a sore neck that has progressively gotten worse. He is tried ice and heat therapies without complete relief. He reports 10/10 pain now in the ED. He reports the pain is relieved at rest, but worse with certain movements. He denies any headaches, vision changes, numbness or weakness, fevers or chills. No abdominal pain, nausea or vomiting, chest pain or shortness of breath.. No other aggravating or modifying factors.  Past Medical History  Diagnosis Date  . Priapism 2014   . Hypertension Dx 2016   Past Surgical History  Procedure Laterality Date  . Colonoscopy    . Polypectomy     Family History  Problem Relation Age of Onset  . Asthma Mother   . Cancer Father   . Prostate cancer Father   . Colon cancer Neg Hx   . Rectal cancer Neg Hx   . Stomach cancer Neg Hx    Social History  Substance Use Topics  . Smoking status: Former Smoker -- 1.00 packs/day    Types: Cigarettes  . Smokeless tobacco: Never Used  . Alcohol Use: No     Comment: last use Jan 2015    Review of Systems A 10 point review of systems was completed and was negative except for pertinent positives and negatives as mentioned in the history of present illness     Allergies  Latex  Home Medications   Prior to Admission medications   Medication Sig Start Date End Date Taking? Authorizing Provider  cetirizine (ZYRTEC) 10 MG tablet Take 1 tablet (10 mg total) by mouth daily. 03/12/15   Josalyn Funches, MD  cyclobenzaprine (FLEXERIL) 10 MG tablet Take 1 tablet (10 mg total) by  mouth 3 (three) times daily as needed for muscle spasms. 03/12/15   Josalyn Funches, MD  ibuprofen (ADVIL,MOTRIN) 600 MG tablet Take 1 tablet (600 mg total) by mouth every 6 (six) hours as needed. 05/31/15   Comer Locket, PA-C  lisinopril-hydrochlorothiazide (PRINZIDE) 10-12.5 MG per tablet Take 1 tablet by mouth daily. 11/06/14   Josalyn Funches, MD  naproxen (NAPROSYN) 500 MG tablet Take 1 tablet (500 mg total) by mouth 2 (two) times daily with a meal. 03/12/15   Josalyn Funches, MD  traMADol (ULTRAM) 50 MG tablet Take 1 tablet (50 mg total) by mouth every 8 (eight) hours as needed for severe pain. 03/12/15   Josalyn Funches, MD   BP 125/90 mmHg  Pulse 86  Temp(Src) 98 F (36.7 C) (Oral)  Resp 18  SpO2 100% Physical Exam  Constitutional:  Awake, alert, nontoxic appearance.  HENT:  Head: Atraumatic.  Eyes: Right eye exhibits no discharge. Left eye exhibits no discharge.  Neck: Neck supple.  Patient maintains full active range of motion of cervical spine. Able to rotate neck left and right 45 without difficulty. Full flexion and extension. Tenderness to palpation over right distal clavicle. No carotid bruits.  Pulmonary/Chest: Effort normal. He exhibits no tenderness.  Abdominal: Soft. There is no tenderness. There is no rebound.  Musculoskeletal: He exhibits no tenderness.  Baseline ROM, no obvious new focal  weakness. Full range of motion of right extremity including elbow and wrist. Distal pulses intact grip strength intact.  Neurological:  Mental status and motor strength appears baseline for patient and situation. Sensation intact to light touch. Gait is baseline.  Skin: No rash noted.  Psychiatric: He has a normal mood and affect.  Nursing note and vitals reviewed.   ED Course  Procedures (including critical care time) Labs Review Labs Reviewed - No data to display  Imaging Review No results found. I have personally reviewed and evaluated these images and lab results as  part of my medical decision-making.   EKG Interpretation None     Meds given in ED:  Medications  ketorolac (TORADOL) injection 60 mg (60 mg Intramuscular Given 05/31/15 1346)  diazepam (VALIUM) tablet 5 mg (5 mg Oral Given 05/31/15 1346)    New Prescriptions   IBUPROFEN (ADVIL,MOTRIN) 600 MG TABLET    Take 1 tablet (600 mg total) by mouth every 6 (six) hours as needed.   Filed Vitals:   05/31/15 1218 05/31/15 1449  BP: 132/99 125/90  Pulse: 73 86  Temp: 98 F (36.7 C) 98 F (36.7 C)  TempSrc: Oral Oral  Resp: 18 18  SpO2: 96% 100%    MDM  Vitals stable - WNL -afebrile Pt resting comfortably in ED. PE--no focal neuro deficits on exam. No carotid bruits. Full range of motion of right shoulder and cervical spine.  DDX--suspect musculoskeletal injury to right neck and shoulder. No evidence of other acute or emergent pathology such as carotid or vertebral artery dissection, fracture, spinal cord pathology. No cardiopulmonary or abdominal complaints. Will DC with Motrin. I discussed all relevant lab findings and imaging results with pt and they verbalized understanding. Discussed f/u with PCP within 48 hrs and return precautions, pt very amenable to plan.  Final diagnoses:  Neck and shoulder pain        Comer Locket, PA-C 05/31/15 1459  Milton Ferguson, MD 05/31/15 [id number]

## 2015-06-05 ENCOUNTER — Encounter (HOSPITAL_COMMUNITY): Payer: Self-pay | Admitting: Emergency Medicine

## 2015-06-05 ENCOUNTER — Emergency Department (INDEPENDENT_AMBULATORY_CARE_PROVIDER_SITE_OTHER)
Admission: EM | Admit: 2015-06-05 | Discharge: 2015-06-05 | Disposition: A | Discharge status: Home / Self Care | Payer: Medicaid Other | Source: Home / Self Care | Attending: Family Medicine | Admitting: Family Medicine

## 2015-06-05 DIAGNOSIS — S46811A Strain of other muscles, fascia and tendons at shoulder and upper arm level, right arm, initial encounter: Secondary | ICD-10-CM

## 2015-06-05 DIAGNOSIS — T148XXA Other injury of unspecified body region, initial encounter: Secondary | ICD-10-CM

## 2015-06-05 DIAGNOSIS — T148 Other injury of unspecified body region: Secondary | ICD-10-CM

## 2015-06-05 MED ORDER — DICLOFENAC SODIUM 1 % TD GEL: 1.0000 "application " | Freq: Four times a day (QID) | TRANSDERMAL | Status: DC

## 2015-06-05 MED ORDER — PREDNISONE 20 MG PO TABS: ORAL_TABLET | ORAL | Status: DC

## 2015-06-05 NOTE — Discharge Instructions (Signed)

## 2015-06-05 NOTE — ED Provider Notes (Signed)
CSN: [id number]     Arrival date & time 06/05/15  1723 History   First MD Initiated Contact with Patient 06/05/15 1823     Chief Complaint  Patient presents with  . Shoulder Pain   (Consider location/radiation/quality/duration/timing/severity/associated sxs/prior Treatment) HPI Comments: 59 year old resident of [place] presents to the urgent care for pain in the right shoulder. He states he is unsure as to how he may have injured his right shoulder. It was a gradual onset. It is located primarily to the right trapezius muscle. It is made worse with attempts to shrug shoulder and to lift objects. He is also unable to abduct the arm greater than 30. Denies fall, blunt trauma or other injury. He was seen in the emergency department a couple days after the initial onset and found to have musculoskeletal pain and treated with an injection of Toradol IM and tramadol.  Patient is a 59 y.o. male presenting with shoulder pain.  Shoulder Pain Associated symptoms: no neck pain     Past Medical History  Diagnosis Date  . Priapism 2014   . Hypertension Dx 2016   Past Surgical History  Procedure Laterality Date  . Colonoscopy    . Polypectomy     Family History  Problem Relation Age of Onset  . Asthma Mother   . Cancer Father   . Prostate cancer Father   . Colon cancer Neg Hx   . Rectal cancer Neg Hx   . Stomach cancer Neg Hx    Social History  Substance Use Topics  . Smoking status: Former Smoker -- 1.00 packs/day    Types: Cigarettes  . Smokeless tobacco: Never Used  . Alcohol Use: No     Comment: last use Jan 2015    Review of Systems  Constitutional: Negative.   Respiratory: Negative.   Gastrointestinal: Negative.   Genitourinary: Negative.   Musculoskeletal: Positive for myalgias. Negative for joint swelling, gait problem and neck pain.       As per HPI  Skin: Negative.   Neurological: Negative for dizziness, weakness and headaches.    Allergies  Latex  Home  Medications   Prior to Admission medications   Medication Sig Start Date End Date Taking? Authorizing Provider  lisinopril-hydrochlorothiazide (PRINZIDE) 10-12.5 MG per tablet Take 1 tablet by mouth daily. 11/06/14  Yes Josalyn Funches, MD  cetirizine (ZYRTEC) 10 MG tablet Take 1 tablet (10 mg total) by mouth daily. 03/12/15   Josalyn Funches, MD  cyclobenzaprine (FLEXERIL) 10 MG tablet Take 1 tablet (10 mg total) by mouth 3 (three) times daily as needed for muscle spasms. 03/12/15   Josalyn Funches, MD  diclofenac sodium (VOLTAREN) 1 % GEL Apply 1 application topically 4 (four) times daily. 06/05/15   Janne Napoleon, NP  ibuprofen (ADVIL,MOTRIN) 600 MG tablet Take 1 tablet (600 mg total) by mouth every 6 (six) hours as needed. 05/31/15   Comer Locket, PA-C  naproxen (NAPROSYN) 500 MG tablet Take 1 tablet (500 mg total) by mouth 2 (two) times daily with a meal. 03/12/15   Josalyn Funches, MD  predniSONE (DELTASONE) 20 MG tablet Take 3 tabs po on first day, 2 tabs second day, 2 tabs third day, 1 tab fourth day, 1 tab 5th day. Take with food. 06/05/15   Janne Napoleon, NP  traMADol (ULTRAM) 50 MG tablet Take 1 tablet (50 mg total) by mouth every 8 (eight) hours as needed for severe pain. 03/12/15   Boykin Nearing, MD   Meds Ordered and Administered this  Visit  Medications - No data to display  BP 165/95 mmHg  Pulse 65  Temp(Src) 97.2 F (36.2 C) (Oral)  SpO2 95% No data found.   Physical Exam  Constitutional: He is oriented to person, place, and time. He appears well-developed and well-nourished.  HENT:  Head: Normocephalic and atraumatic.  Eyes: EOM are normal. Left eye exhibits no discharge.  Neck: Normal range of motion. Neck supple.  Pulmonary/Chest: Effort normal. No respiratory distress.  Musculoskeletal:  Tenderness along the ridge of the right trapezius muscle including the right paracervical insertion points. There is no tenderness to the right shoulder joint. Internal and external  rotation is not painful and is complete. Abduction is normal. Abduction is limited to 30. Attempts to shrug his shoulders produces pain in the right trapezius muscle. There is no bony tenderness. No palpable or auscultated crepitus. Distal neurovascular motor sensory is grossly intact. Radial pulse 2+.  Neurological: He is alert and oriented to person, place, and time. No cranial nerve deficit.  Skin: Skin is warm and dry.  Psychiatric: He has a normal mood and affect.  Nursing note and vitals reviewed.   ED Course  Procedures (including critical care time)  Labs Review Labs Reviewed - No data to display  Imaging Review No results found.   Visual Acuity Review  Right Eye Distance:   Left Eye Distance:   Bilateral Distance:    Right Eye Near:   Left Eye Near:    Bilateral Near:         MDM   1. Trapezius strain, right, initial encounter   2. Muscle strain    Heat and stretches Diclofenac gel 4 times a day Prednisone taper dose take with food Limit use of right shoulder and arm for the next few days.    Janne Napoleon, NP 06/05/15 [place]

## 2015-06-05 NOTE — ED Notes (Signed)
C/o right shoulder pain onset x1.5 week; denies inj/trauma Pain increases w/activity A&O x4... No acute distress.

## 2015-06-15 ENCOUNTER — Emergency Department (INDEPENDENT_AMBULATORY_CARE_PROVIDER_SITE_OTHER)
Admission: EM | Admit: 2015-06-15 | Discharge: 2015-06-15 | Disposition: A | Discharge status: Home / Self Care | Payer: Medicaid Other | Source: Home / Self Care | Attending: Emergency Medicine | Admitting: Emergency Medicine

## 2015-06-15 ENCOUNTER — Encounter (HOSPITAL_COMMUNITY): Payer: Self-pay | Admitting: Emergency Medicine

## 2015-06-15 ENCOUNTER — Emergency Department (INDEPENDENT_AMBULATORY_CARE_PROVIDER_SITE_OTHER): Payer: Medicaid Other

## 2015-06-15 DIAGNOSIS — S42001A Fracture of unspecified part of right clavicle, initial encounter for closed fracture: Secondary | ICD-10-CM | POA: Diagnosis not present

## 2015-06-15 DIAGNOSIS — M62838 Other muscle spasm: Secondary | ICD-10-CM

## 2015-06-15 MED ORDER — METHOCARBAMOL 500 MG PO TABS: 500.0000 mg | ORAL_TABLET | Freq: Four times a day (QID) | ORAL | Status: DC

## 2015-06-15 NOTE — ED Notes (Addendum)
Pt herewith c/o continued right shoulder pain now radiating to collar bone since last visit 10/21 Pt was prescribed pain gel but unable to afford Tightness noted without relief from prescribed Prednisone, otc meds, heat pad and ice

## 2015-06-15 NOTE — ED Provider Notes (Signed)
HPI  SUBJECTIVE:  Erik Nguyen is a 59 y.o. male who presents with 3 weeks of throbbing, constant, right shoulder pain. States that it is along the distal part of his right collarbone and along his entire shoulder. Symptoms are better with a heating pad, worse with ice pack, abduction, use of his arm. He has been prescribed 600 mg ibuprofen and prednisone in the past. States that he has not been prescribed or taking any other medications including Flexeril. States that the prednisone helped some. Patient states that he does a lot of overhead repetitive motions with his right arm. The pain occasionally wakes him up at night. No numbness, tingling, fevers, weight loss, history of previous injury to the shoulder, weakness. He is left-handed. He is a resident of [place], unable to take narcotics. No history of hypertension, diabetes, cancer.  Patient has been seen for this 2 times in the past month. Most recently sent home with diclofenac gel and prednisone.  Past Medical History  Diagnosis Date  . Priapism 2014   . Hypertension Dx 2016    Past Surgical History  Procedure Laterality Date  . Colonoscopy    . Polypectomy      Family History  Problem Relation Age of Onset  . Asthma Mother   . Cancer Father   . Prostate cancer Father   . Colon cancer Neg Hx   . Rectal cancer Neg Hx   . Stomach cancer Neg Hx     Social History  Substance Use Topics  . Smoking status: Former Smoker -- 1.00 packs/day    Types: Cigarettes  . Smokeless tobacco: Never Used  . Alcohol Use: No     Comment: last use Jan 2015    No current facility-administered medications for this encounter.  Current outpatient prescriptions:  .  cetirizine (ZYRTEC) 10 MG tablet, Take 1 tablet (10 mg total) by mouth daily., Disp: 30 tablet, Rfl: 11 .  lisinopril-hydrochlorothiazide (PRINZIDE) 10-12.5 MG per tablet, Take 1 tablet by mouth daily., Disp: 30 tablet, Rfl: 11 .  methocarbamol (ROBAXIN) 500 MG tablet, Take  1 tablet (500 mg total) by mouth 4 (four) times daily., Disp: 40 tablet, Rfl: 0 .  predniSONE (DELTASONE) 20 MG tablet, Take 3 tabs po on first day, 2 tabs second day, 2 tabs third day, 1 tab fourth day, 1 tab 5th day. Take with food., Disp: 9 tablet, Rfl: 0 .  traMADol (ULTRAM) 50 MG tablet, Take 1 tablet (50 mg total) by mouth every 8 (eight) hours as needed for severe pain., Disp: 15 tablet, Rfl: 0  Allergies  Allergen Reactions  . Latex Itching and Rash     ROS  As noted in HPI.   Physical Exam  BP 144/94 mmHg  Pulse 98  Temp(Src) 98.1 F (36.7 C) (Oral)  SpO2 98%  Constitutional: Well developed, well nourished, no acute distress Eyes:  EOMI, conjunctiva normal bilaterally HENT: Normocephalic, atraumatic,mucus membranes moist Respiratory: Normal inspiratory effort Cardiovascular: Normal rate. no carotid bruit. GI: nondistended skin: No rash, skin intact Musculoskeletal: Affected shoulder with ROM somewhat limited, drop test painful but negative, tenderness and muscle spasm along the trapezius, distal clavicle tender, A/C joint NT,  proximal humerus NT, shoulder joint tender, Motor strength decreased at shoulder due to pain, Sensation intact LT over deltoid region, distal NVI with hand on affected side having intact sensation and strength in the distribution of the median, radial, and ulnar nerve function. negative tenderness in bicipital groove, positive empty can test positive liftoff  test,  Neurologic: Alert & oriented x 3, no focal neuro deficits Psychiatric: Speech and behavior appropriate   ED Course   Medications - No data to display  Orders Placed This Encounter  Procedures  . DG Shoulder Right    Standing Status: Standing     Number of Occurrences: 1     Standing Expiration Date:     Order Specific Question:  Reason for Exam (SYMPTOM  OR DIAGNOSIS REQUIRED)    Answer:  pain with ROM x 3 weeks tender trapezius and along rotator cuff  . Sling immobilizer     Standing Status: Standing     Number of Occurrences: 1     Standing Expiration Date:     Order Specific Question:  Laterality    Answer:  Right    Order Specific Question:  To be worn    Answer:  for comfort    No results found for this or any previous visit (from the past 24 hour(s)). Dg Shoulder Right  06/15/2015  CLINICAL DATA:  Right shoulder pain for the past 3 weeks. Heavy lifting at work. EXAM: RIGHT SHOULDER - 2+ VIEW COMPARISON:  None. FINDINGS: Mildly comminuted fracture of the mid to distal right clavicle at an area of oval lucency within the clavicle. The oval lucency measures 3.4 x 1.7 cm. Mild, ill-defined callus superiorly. Minimal inferior displacement of the distal fragment without significant angulation. IMPRESSION: Minimally displaced, healing fracture of the mid to distal right clavicle through an oval area of lucency. The appearance is suspicious for a pathological fracture, possibly through a metastasis or myeloma. Electronically Signed   By: Claudie Revering M.D.   On: 06/15/2015 14:13    ED Clinical Impression  Clavicle fracture, right, closed, initial encounter  Muscle spasms of neck   ED Assessment/Plan  No imaging done yet. We'll x-ray to rule out any acute changes.  Appears to be musculoskeletal origin. Doubt cardiac cause of this pain.   X-ray shows minimally displaced, healing fracture of the mid to distal right clavicle. Area of lucency, suspicious for pathologic fracture.  Home with Tylenol, sling, muscle relaxant, orthopedic referral.  Discussed  imaging, MDM, plan and followup with patient. Discussed sn/sx that should prompt return to the UC or ED. Patient agrees with plan.   *This clinic note was created using Dragon dictation software. Therefore, there may be occasional mistakes despite careful proofreading.  ?   Melynda Ripple, MD 06/15/15 1500

## 2015-06-24 ENCOUNTER — Ambulatory Visit: Payer: Self-pay

## 2015-07-13 ENCOUNTER — Ambulatory Visit: Payer: Self-pay

## 2015-07-17 ENCOUNTER — Encounter: Payer: Self-pay | Admitting: Family Medicine

## 2015-07-17 ENCOUNTER — Ambulatory Visit: Payer: Medicaid Other | Attending: Family Medicine | Admitting: Family Medicine

## 2015-07-17 ENCOUNTER — Other Ambulatory Visit: Payer: Self-pay | Admitting: Family Medicine

## 2015-07-17 ENCOUNTER — Ambulatory Visit (HOSPITAL_COMMUNITY)
Admission: RE | Admit: 2015-07-17 | Discharge: 2015-07-17 | Disposition: A | Discharge status: Home / Self Care | Payer: Self-pay | Source: Ambulatory Visit | Attending: Family Medicine | Admitting: Family Medicine

## 2015-07-17 VITALS — BP 138/86 | HR 87 | Temp 98.2°F | Resp 16 | Ht 66.0 in | Wt 166.0 lb

## 2015-07-17 DIAGNOSIS — S42001A Fracture of unspecified part of right clavicle, initial encounter for closed fracture: Secondary | ICD-10-CM | POA: Insufficient documentation

## 2015-07-17 DIAGNOSIS — S42001D Fracture of unspecified part of right clavicle, subsequent encounter for fracture with routine healing: Secondary | ICD-10-CM | POA: Insufficient documentation

## 2015-07-17 DIAGNOSIS — Z87891 Personal history of nicotine dependence: Secondary | ICD-10-CM | POA: Insufficient documentation

## 2015-07-17 DIAGNOSIS — Z79899 Other long term (current) drug therapy: Secondary | ICD-10-CM | POA: Insufficient documentation

## 2015-07-17 DIAGNOSIS — R0789 Other chest pain: Secondary | ICD-10-CM | POA: Insufficient documentation

## 2015-07-17 DIAGNOSIS — Z23 Encounter for immunization: Secondary | ICD-10-CM | POA: Diagnosis not present

## 2015-07-17 DIAGNOSIS — M25511 Pain in right shoulder: Secondary | ICD-10-CM | POA: Insufficient documentation

## 2015-07-17 DIAGNOSIS — Z1159 Encounter for screening for other viral diseases: Secondary | ICD-10-CM | POA: Insufficient documentation

## 2015-07-17 DIAGNOSIS — Z0000 Encounter for general adult medical examination without abnormal findings: Secondary | ICD-10-CM | POA: Insufficient documentation

## 2015-07-17 DIAGNOSIS — X58XXXD Exposure to other specified factors, subsequent encounter: Secondary | ICD-10-CM | POA: Insufficient documentation

## 2015-07-17 MED ORDER — METHOCARBAMOL 500 MG PO TABS: 500.0000 mg | ORAL_TABLET | Freq: Three times a day (TID) | ORAL | Status: DC | PRN

## 2015-07-17 MED ORDER — MELOXICAM 15 MG PO TABS: 15.0000 mg | ORAL_TABLET | Freq: Every day | ORAL | Status: DC

## 2015-07-17 MED ORDER — DICLOFENAC SODIUM 1 % TD GEL: 2.0000 g | Freq: Four times a day (QID) | TRANSDERMAL | Status: DC

## 2015-07-17 NOTE — Patient Instructions (Addendum)
Erik Nguyen was seen today for shoulder pain.  Diagnoses and all orders for this visit:  Closed right clavicular fracture, with routine healing, subsequent encounter -     diclofenac sodium (VOLTAREN) 1 % GEL; Apply 2 g topically 4 (four) times daily. -     meloxicam (MOBIC) 15 MG tablet; Take 1 tablet (15 mg total) by mouth daily. -     methocarbamol (ROBAXIN) 500 MG tablet; Take 1 tablet (500 mg total) by mouth every 8 (eight) hours as needed for muscle spasms. -     Vitamin D, 25-hydroxy -     DG Shoulder Right; Future -     Ambulatory referral to Physical Therapy  Pain in joint of right shoulder  Need for hepatitis C screening test -     Hepatitis C antibody, reflex  Healthcare maintenance -     Tdap vaccine greater than or equal to 7yo IM  Chest wall pain -     DG Chest 2 View; Future   Take mobic daily for 5-7 days with food, then as needed only  F/u in 8 weeks for R shoulder pain   Dr. Adrian Blackwater

## 2015-07-17 NOTE — Progress Notes (Signed)
F/U clavicle Fx Stated has pain around rib area and rt side of neck Pain scale #3 No tobacco user  No suicide thought in the past two weeks

## 2015-07-17 NOTE — Assessment & Plan Note (Signed)
A: R shoulder pain with MSK tenderness and decreased ROM following fracture P: PT Repeat x-ray NSAID oral for short continuous course, then prn  Topical NSAID Muscle relaxer

## 2015-07-17 NOTE — Progress Notes (Signed)
Subjective:  Patient ID: Erik Nguyen, male    DOB: 1956-01-03  Age: 59 y.o. MRN: [id number]  CC: Shoulder Pain   HPI Ky Clum presents for f/u he is un  1. R shoulder pain: started the second week of October 2015. Unknown injury. He went to ED x one and urgent care x 2.   Imaging done in ED on 06/15/2015  found non-displaced mid to distal right clavicle fracture. He reports clavicular pain is worsening. He also has neck and rib cage pain at bilateral flanks. He took tramadol for the pain which did not help much. He also took robaxin which helped some.   2. Healthcare maintenance: declines flu. Amenable to tetanus and hep C.   Social History  Substance Use Topics  . Smoking status: Former Smoker -- 1.00 packs/day    Types: Cigarettes  . Smokeless tobacco: Never Used  . Alcohol Use: No     Comment: last use Jan 2015   Outpatient Prescriptions Prior to Visit  Medication Sig Dispense Refill  . cetirizine (ZYRTEC) 10 MG tablet Take 1 tablet (10 mg total) by mouth daily. 30 tablet 11  . lisinopril-hydrochlorothiazide (PRINZIDE) 10-12.5 MG per tablet Take 1 tablet by mouth daily. 30 tablet 11  . methocarbamol (ROBAXIN) 500 MG tablet Take 1 tablet (500 mg total) by mouth 4 (four) times daily. 40 tablet 0  . predniSONE (DELTASONE) 20 MG tablet Take 3 tabs po on first day, 2 tabs second day, 2 tabs third day, 1 tab fourth day, 1 tab 5th day. Take with food. 9 tablet 0  . traMADol (ULTRAM) 50 MG tablet Take 1 tablet (50 mg total) by mouth every 8 (eight) hours as needed for severe pain. 15 tablet 0   No facility-administered medications prior to visit.    ROS Review of Systems  Constitutional: Negative for fever, chills, fatigue and unexpected weight change.  Eyes: Negative for visual disturbance.  Respiratory: Positive for cough. Negative for shortness of breath.   Cardiovascular: Positive for chest pain. Negative for palpitations and leg swelling.  Gastrointestinal: Negative for  nausea, vomiting, abdominal pain, diarrhea, constipation and blood in stool.  Endocrine: Negative for polydipsia, polyphagia and polyuria.  Musculoskeletal: Positive for myalgias. Negative for back pain, arthralgias, gait problem and neck pain.  Skin: Negative for rash.  Allergic/Immunologic: Negative for immunocompromised state.  Hematological: Negative for adenopathy. Does not bruise/bleed easily.  Psychiatric/Behavioral: Negative for suicidal ideas, sleep disturbance and dysphoric mood. The patient is not nervous/anxious.     Objective:  BP 138/86 mmHg  Pulse 87  Temp(Src) 98.2 F (36.8 C) (Oral)  Resp 16  Ht 5\' 6"  (1.676 m)  Wt 166 lb (75.297 kg)  BMI 26.81 kg/m2  SpO2 96%  BP/Weight 07/17/2015 06/15/2015 [id number]  Systolic BP 0000000 [id number]  Diastolic BP 86 94 95  Wt. (Lbs) 166 - -  BMI 26.81 - -    Physical Exam  Constitutional: He appears well-developed and well-nourished. No distress.  HENT:  Head: Normocephalic and atraumatic.  Neck: Normal range of motion. Neck supple.  Cardiovascular: Normal rate, regular rhythm, normal heart sounds and intact distal pulses.   Pulmonary/Chest: Effort normal and breath sounds normal. He exhibits no tenderness.  Musculoskeletal: He exhibits no edema.       Right shoulder: He exhibits bony tenderness and pain. He exhibits normal range of motion, no tenderness, no swelling, no crepitus, no deformity, no laceration, no spasm, normal pulse and normal strength.  ROM limited in R  shoulder 100 degree abduction  90 degree extension   Neurological: He is alert.  Skin: Skin is warm and dry. No rash noted. No erythema.  Psychiatric: He has a normal mood and affect.     Assessment & Plan:   [place] was seen today for shoulder pain.  Diagnoses and all orders for this visit:  Closed right clavicular fracture, with routine healing, subsequent encounter -     diclofenac sodium (VOLTAREN) 1 % GEL; Apply 2 g topically 4 (four) times daily. -      meloxicam (MOBIC) 15 MG tablet; Take 1 tablet (15 mg total) by mouth daily. -     methocarbamol (ROBAXIN) 500 MG tablet; Take 1 tablet (500 mg total) by mouth every 8 (eight) hours as needed for muscle spasms. -     Vitamin D, 25-hydroxy -     DG Shoulder Right; Future -     Ambulatory referral to Physical Therapy  Pain in joint of right shoulder  Need for hepatitis C screening test -     Hepatitis C antibody, reflex  Healthcare maintenance -     Tdap vaccine greater than or equal to 7yo IM  Chest wall pain -     DG Chest 2 View; Future   Follow-up: No Follow-up on file.   Boykin Nearing MD

## 2015-07-17 NOTE — Assessment & Plan Note (Signed)
B/l chest wall pain without bony tenderness. No deformity.   CXR ordered NSAID ordered

## 2015-07-18 LAB — VITAMIN D 25 HYDROXY (VIT D DEFICIENCY, FRACTURES): VIT D 25 HYDROXY: 19 ng/mL — AB (ref 30–100)

## 2015-07-18 LAB — HEPATITIS C ANTIBODY: HCV Ab: NEGATIVE

## 2015-07-22 ENCOUNTER — Telehealth: Payer: Self-pay | Admitting: Family Medicine

## 2015-07-22 ENCOUNTER — Ambulatory Visit: Payer: Self-pay | Attending: Family Medicine

## 2015-07-22 DIAGNOSIS — E559 Vitamin D deficiency, unspecified: Secondary | ICD-10-CM

## 2015-07-22 DIAGNOSIS — S42001G Fracture of unspecified part of right clavicle, subsequent encounter for fracture with delayed healing: Secondary | ICD-10-CM

## 2015-07-22 NOTE — Assessment & Plan Note (Signed)
R clavicle fracture is still not healing Clavicular metastatic lesion or myeloma possible  Metastatic disease unlikely as patient had recent colonoscopy has hx of smoking but not smoking now  Will w/u for myleloma Patient called

## 2015-07-22 NOTE — Telephone Encounter (Signed)
R clavicle fracture is still not healing Clavicular metastatic lesion or myeloma possible  Metastatic disease unlikely as patient had recent colonoscopy has hx of smoking but not smoking now  Will w/u for myeloma  Patient called  Verified name and DOB Gave lab results We discussed my concerns and desire to rule out MM and metastatic disease with additional labs and x-rays. Patient voiced understanding. He agreed with plan.  He will go for additional x-rays on 07/24/15 in PM He will come for blood draw and to get urine collection container on 07/24/15 between 2-3 PM.

## 2015-07-24 ENCOUNTER — Ambulatory Visit (HOSPITAL_COMMUNITY)
Admission: RE | Admit: 2015-07-24 | Discharge: 2015-07-24 | Disposition: A | Discharge status: Home / Self Care | Payer: Self-pay | Source: Ambulatory Visit | Attending: Family Medicine | Admitting: Family Medicine

## 2015-07-24 ENCOUNTER — Ambulatory Visit: Payer: Self-pay | Attending: Family Medicine

## 2015-07-24 DIAGNOSIS — S42001G Fracture of unspecified part of right clavicle, subsequent encounter for fracture with delayed healing: Secondary | ICD-10-CM | POA: Insufficient documentation

## 2015-07-24 DIAGNOSIS — X58XXXD Exposure to other specified factors, subsequent encounter: Secondary | ICD-10-CM | POA: Insufficient documentation

## 2015-07-24 LAB — CBC WITH DIFFERENTIAL/PLATELET
Basophils Absolute: 0.1 10*3/uL (ref 0.0–0.1)
Basophils Relative: 1 % (ref 0–1)
EOS PCT: 7 % — AB (ref 0–5)
Eosinophils Absolute: 0.4 10*3/uL (ref 0.0–0.7)
HEMATOCRIT: 37.7 % — AB (ref 39.0–52.0)
HEMOGLOBIN: 12.9 g/dL — AB (ref 13.0–17.0)
LYMPHS ABS: 1.7 10*3/uL (ref 0.7–4.0)
LYMPHS PCT: 32 % (ref 12–46)
MCH: 31.8 pg (ref 26.0–34.0)
MCHC: 34.2 g/dL (ref 30.0–36.0)
MCV: 92.9 fL (ref 78.0–100.0)
MONO ABS: 0.5 10*3/uL (ref 0.1–1.0)
MONOS PCT: 10 % (ref 3–12)
MPV: 9.5 fL (ref 8.6–12.4)
NEUTROS ABS: 2.7 10*3/uL (ref 1.7–7.7)
Neutrophils Relative %: 50 % (ref 43–77)
Platelets: 267 10*3/uL (ref 150–400)
RBC: 4.06 MIL/uL — AB (ref 4.22–5.81)
RDW: 13.2 % (ref 11.5–15.5)
WBC: 5.4 10*3/uL (ref 4.0–10.5)

## 2015-07-24 LAB — COMPLETE METABOLIC PANEL WITH GFR
ALT: 36 U/L (ref 9–46)
AST: 24 U/L (ref 10–35)
Albumin: 4 g/dL (ref 3.6–5.1)
Alkaline Phosphatase: 68 U/L (ref 40–115)
BILIRUBIN TOTAL: 0.4 mg/dL (ref 0.2–1.2)
BUN: 17 mg/dL (ref 7–25)
CALCIUM: 9.6 mg/dL (ref 8.6–10.3)
CHLORIDE: 104 mmol/L (ref 98–110)
CO2: 27 mmol/L (ref 20–31)
CREATININE: 1.13 mg/dL (ref 0.70–1.33)
GFR, EST AFRICAN AMERICAN: 82 mL/min (ref >=60)
GFR, Est Non African American: 71 mL/min (ref >=60)
Glucose, Bld: 148 mg/dL — ABNORMAL HIGH (ref 65–99)
Potassium: 4.4 mmol/L (ref 3.5–5.3)
Sodium: 143 mmol/L (ref 135–146)
TOTAL PROTEIN: 6.5 g/dL (ref 6.1–8.1)

## 2015-07-27 ENCOUNTER — Ambulatory Visit: Payer: Self-pay

## 2015-07-27 LAB — BETA 2 MICROGLOBULIN, SERUM: Beta-2 Microglobulin: 2.98 mg/L — ABNORMAL HIGH (ref <=2.51)

## 2015-07-28 ENCOUNTER — Ambulatory Visit: Payer: Self-pay | Admitting: Physical Therapy

## 2015-07-28 LAB — PROTEIN ELECTROPHORESIS, SERUM
ALBUMIN ELP: 4 g/dL (ref 3.8–4.8)
ALPHA-1-GLOBULIN: 0.3 g/dL (ref 0.2–0.3)
Alpha-2-Globulin: 0.7 g/dL (ref 0.5–0.9)
BETA 2: 0.5 g/dL (ref 0.2–0.5)
BETA GLOBULIN: 0.3 g/dL — AB (ref 0.4–0.6)
GAMMA GLOBULIN: 0.6 g/dL — AB (ref 0.8–1.7)
Total Protein, Serum Electrophoresis: 6.5 g/dL (ref 6.1–8.1)

## 2015-07-29 ENCOUNTER — Telehealth: Payer: Self-pay

## 2015-07-29 DIAGNOSIS — S42001G Fracture of unspecified part of right clavicle, subsequent encounter for fracture with delayed healing: Secondary | ICD-10-CM

## 2015-07-29 NOTE — Telephone Encounter (Signed)
Erik Nguyen with [facility] called to inform nurse of urine order being placed, lab has not received urine.  Nurse called patient. Patient started 24 hour urine collection this morning and will be completed tomorrow morning.  Patient will bring urine to clinic tomorrow to be sent to lab. Patient understands collection instructions and verbally explained procedure for urine collection to nurse.  Nurse returned called to Erik Nguyen, reached [name]. [name] will make [name] aware of patient currently collecting 24 hour urine and will send to lab tomorrow.

## 2015-07-29 NOTE — Telephone Encounter (Signed)
Virgel Gess with [facility] called to confirm urine order will not be canceled. [name] is aware of urine being sent to lab tomorrow when patient brings urine to clinic.

## 2015-07-30 ENCOUNTER — Ambulatory Visit: Payer: Self-pay | Attending: Family Medicine

## 2015-07-30 ENCOUNTER — Telehealth: Payer: Self-pay | Admitting: Family Medicine

## 2015-07-30 DIAGNOSIS — S42001G Fracture of unspecified part of right clavicle, subsequent encounter for fracture with delayed healing: Secondary | ICD-10-CM

## 2015-07-30 DIAGNOSIS — R803 Bence Jones proteinuria: Secondary | ICD-10-CM

## 2015-07-30 NOTE — Addendum Note (Signed)
Addended by: Boykin Nearing on: 07/30/2015 01:04 PM   Modules accepted: Orders

## 2015-07-30 NOTE — Telephone Encounter (Signed)
-----   Message from Boykin Nearing, MD sent at 07/29/2015  9:00 AM EST ----- Bone survey is negative for fractures or bony lesions other than known R clavicle fracture

## 2015-08-03 ENCOUNTER — Ambulatory Visit: Payer: Self-pay | Attending: Family Medicine

## 2015-08-03 LAB — UIFE/LIGHT CHAINS/TP QN, 24-HR UR
Albumin, U: DETECTED
Alpha 1, Urine: DETECTED — AB
Alpha 2, Urine: DETECTED — AB
BETA UR: DETECTED — AB
Gamma Globulin, Urine: DETECTED — AB
TOTAL PROTEIN, URINE-UPE24: 165 mg/dL — AB (ref 5–25)
TOTAL PROTEIN, URINE-UR/DAY: 3300 mg/d — AB (ref <150)

## 2015-08-03 LAB — IMMUNOFIXATION INTE

## 2015-08-04 LAB — PROTEIN ELECTROPHORESIS, URINE REFLEX
ALPHA-1-GLOBULIN, U: 5.1 %
Albumin: 2.4 %
Alpha-2-Globulin, U: 2.6 %
Beta Globulin, U: 2.5 %
GAMMA GLOBULIN, U: 87.4 %
MONOCLONAL BAND 1: 82 %
TOTAL PROTEIN, URINE: 165 mg/dL
Total Protein, Urine/Day: 3300 mg/d — ABNORMAL HIGH (ref 50–100)

## 2015-08-12 DIAGNOSIS — R803 Bence Jones proteinuria: Secondary | ICD-10-CM | POA: Insufficient documentation

## 2015-08-12 NOTE — Telephone Encounter (Signed)
Patient called Verified name  Erik Nguyen proteins on electrophoresis, Negative x-ray Normal calcium Heme onc referral placed to review work up and give final say on whether multiple myeloma is present or not. Patient agrees with plan and voices understanding

## 2015-08-13 ENCOUNTER — Telehealth: Payer: Self-pay | Admitting: Hematology and Oncology

## 2015-08-13 NOTE — Telephone Encounter (Signed)
new patient appt-s/w patient and gave np appt for 01/05 @ 10:15 w/Dr. Alvy Bimler Referring Dr. Boykin Nearing Dx-Bence-Jones proteinura

## 2015-08-20 ENCOUNTER — Ambulatory Visit (HOSPITAL_BASED_OUTPATIENT_CLINIC_OR_DEPARTMENT_OTHER): Payer: No Typology Code available for payment source | Admitting: Hematology and Oncology

## 2015-08-20 ENCOUNTER — Encounter: Payer: Self-pay | Admitting: Hematology and Oncology

## 2015-08-20 VITALS — BP 158/95 | HR 65 | Temp 98.1°F | Resp 19 | Wt 171.2 lb

## 2015-08-20 DIAGNOSIS — S42001A Fracture of unspecified part of right clavicle, initial encounter for closed fracture: Secondary | ICD-10-CM

## 2015-08-20 DIAGNOSIS — M545 Low back pain: Secondary | ICD-10-CM

## 2015-08-20 DIAGNOSIS — G8929 Other chronic pain: Secondary | ICD-10-CM

## 2015-08-20 DIAGNOSIS — R803 Bence Jones proteinuria: Secondary | ICD-10-CM

## 2015-08-20 DIAGNOSIS — Z8042 Family history of malignant neoplasm of prostate: Secondary | ICD-10-CM

## 2015-08-20 DIAGNOSIS — Z87891 Personal history of nicotine dependence: Secondary | ICD-10-CM

## 2015-08-20 MED ORDER — OXYCODONE HCL 5 MG PO TABS: 5.0000 mg | ORAL_TABLET | ORAL | Status: DC | PRN

## 2015-08-21 ENCOUNTER — Telehealth: Payer: Self-pay | Admitting: *Deleted

## 2015-08-21 ENCOUNTER — Other Ambulatory Visit: Payer: Self-pay | Admitting: Hematology and Oncology

## 2015-08-21 DIAGNOSIS — C9000 Multiple myeloma not having achieved remission: Secondary | ICD-10-CM

## 2015-08-21 MED FILL — traMADol HCL 50 MG TABS: 50 | 5 days supply | Qty: 15 | Fill #0

## 2015-08-21 MED FILL — METHOCARBAMOL 500 MG TABLET: 500 | 20 days supply | Qty: 60 | Fill #1

## 2015-08-21 MED FILL — VOLTAREN 1% GEL: 1 | 25 days supply | Qty: 100 | Fill #1 | Status: TO

## 2015-08-21 NOTE — Telephone Encounter (Signed)
Pt wanted to schedule his BMBx for Friday morning.   Scheduled at Short Stay at 8 am on 1/13.  Pt to arrive at 7 am and nothing to eat or drink after midnight,  Needs driver home.  Pt verbalized understanding.  Notified Butch Penny in [employer].

## 2015-08-21 NOTE — Assessment & Plan Note (Signed)
He has severe pain. I recommend a trial of narcotics prescription. I warned him about side effects and will assess his pain next week.

## 2015-08-21 NOTE — Progress Notes (Signed)
[facility] CONSULT NOTE  Patient Care Team: Boykin Nearing, MD as PCP - General (Family Medicine)  CHIEF COMPLAINTS/PURPOSE OF CONSULTATION:   lytic lesion with incidental right collarbone fracture , positive Bence Jones proteinemia, worrisome for undiagnosed multiple myeloma HISTORY OF PRESENTING ILLNESS:  Erik Nguyen 60 y.o. male is here because of  Recent fracture. This patient had been complaining of  Right shoulder pain for the past month. He did not recall any trauma or injury.  it is worse with movement and at nighttime. He was prescribed some pain medicine and Voltaren gel with minimum relief. 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 He has chronic back pain for a long time. He was recommended physical therapy but due to lack of insurance that has not started. Thankfully, the patient is left-handed and was able to perform most activities of daily living.  Patient denies history of recurrent infection or atypical infections such as shingles of meningitis. Denies chills, night sweats, anorexia or abnormal weight loss.  he has significant evaluation including blood work, imaging study and urine test which detected lytic lesion and Bence Jones proteinemia. He is then referred here.  MEDICAL HISTORY:  Past Medical History  Diagnosis Date  . Priapism 2014   . Hypertension Dx 2016    SURGICAL HISTORY: Past Surgical History  Procedure Laterality Date  . Colonoscopy    . Polypectomy      SOCIAL HISTORY: Social History   Social History  . Marital Status: Legally Separated    Spouse Name: N/A  . Number of Children: N/A  . Years of Education: N/A   Occupational History  . Not on file.   Social History Main Topics  . Smoking status: Former Smoker -- 1.00 packs/day    Types: Cigarettes  . Smokeless tobacco: Never Used  . Alcohol Use: No     Comment: last use Jan 2015   . Drug Use: 7.00 per week    Special: Cocaine, "Crack" cocaine     Comment: last use jan 2015  . Sexual Activity: Not on file     Comment: worked fund raising, living in a program, married   Other Topics Concern  . Not on file   Social History Narrative    FAMILY HISTORY: Family History  Problem Relation Age of Onset  . Asthma Mother   . Cancer Father   . Prostate cancer Father   . Colon cancer Neg Hx   . Rectal cancer Neg Hx   . Stomach cancer Neg Hx     ALLERGIES:  is allergic to latex.  MEDICATIONS:  Current Outpatient Prescriptions  Medication Sig Dispense Refill  . diclofenac sodium (VOLTAREN) 1 % GEL Apply 2 g topically 4 (four) times daily. 100 g 2  . lisinopril-hydrochlorothiazide (PRINZIDE) 10-12.5 MG per tablet Take 1 tablet by mouth daily. 30 tablet 11  . meloxicam (MOBIC) 15 MG tablet Take 1 tablet (15 mg total) by mouth daily. 30 tablet 0  . methocarbamol (ROBAXIN) 500 MG tablet Take 1 tablet (500 mg total) by mouth every 8 (eight) hours as needed for muscle spasms. 60 tablet 1  . oxyCODONE (OXY IR/ROXICODONE) 5 MG immediate release tablet Take 1 tablet (5 mg total) by mouth every 4 (four) hours as needed for severe pain. 30 tablet 0   No current facility-administered medications for this visit.    REVIEW OF SYSTEMS:   Eyes: Denies blurriness  of vision, double vision or watery eyes Ears, nose, mouth, throat, and face: Denies mucositis or sore throat Respiratory: Denies cough, dyspnea or wheezes Cardiovascular: Denies palpitation, chest discomfort or lower extremity swelling Gastrointestinal:  Denies nausea, heartburn or change in bowel habits Skin: Denies abnormal skin rashes Lymphatics: Denies new lymphadenopathy or easy bruising Neurological:Denies numbness, tingling or new weaknesses Behavioral/Psych: Mood is stable, no new changes  All other systems were reviewed with the patient and are negative.  PHYSICAL EXAMINATION: ECOG PERFORMANCE STATUS: 1 -  Symptomatic but completely ambulatory  Filed Vitals:   08/20/15 0959  BP: 158/95  Pulse: 65  Temp: 98.1 F (36.7 C)  Resp: 19   Filed Weights   08/20/15 0959  Weight: 171 lb 3.2 oz (77.656 kg)    GENERAL:alert, no distress and comfortable SKIN: skin color, texture, turgor are normal, no rashes or significant lesions EYES: normal, conjunctiva are pink and non-injected, sclera clear OROPHARYNX:no exudate, no erythema and lips, buccal mucosa, and tongue normal  NECK: supple, thyroid normal size, non-tender, without nodularity LYMPH:  no palpable lymphadenopathy in the cervical, axillary or inguinal LUNGS: clear to auscultation and percussion with normal breathing effort HEART: regular rate & rhythm and no murmurs and no lower extremity edema ABDOMEN:abdomen soft, non-tender and normal bowel sounds Musculoskeletal:no cyanosis of digits and no clubbing  PSYCH: alert & oriented x 3 with fluent speech NEURO:   difficult examination due to right shoulder pain.  LABORATORY DATA:  I have reviewed the data as listed Lab Results  Component Value Date   WBC 5.4 07/24/2015   HGB 12.9* 07/24/2015   HCT 37.7* 07/24/2015   MCV 92.9 07/24/2015   PLT 267 07/24/2015    RADIOGRAPHIC STUDIES: I have personally reviewed the radiological images as listed and agreed with the findings in the report. Dg Bone Survey Met  07/24/2015  CLINICAL DATA:  60 year old male with delayed healing of right clavicle fracture. Suspected pathologic fracture. Subsequent encounter. EXAM: METASTATIC BONE SURVEY COMPARISON:  Shoulder radiographs 07/17/2015 and earlier. Chest radiographs 12/ 2 /16 and earlier, abdomen series 12/28/14. CT Abdomen and Pelvis 12/28/2014. FINDINGS: Calvarium bone mineralization within normal limits. Cervical spine bone mineralization within normal limits. Degenerative disc and endplate changes below C4. Mild anterolisthesis at C3-C4 appears degenerative. Left carotid calcified atherosclerosis  in the neck. Normal thoracic segmentation. Cervicothoracic junction alignment is within normal limits. Chronic mid thoracic anterior endplate spurring. Thoracic vertebral height and alignment within normal limits. Thoracic bone mineralization within normal limits. Normal lumbar segmentation. Stable lumbar vertebral height and alignment. Chronic anterior lumbar endplate spurring. Thoracic visceral contours appear stable and within normal limits. Rib mineralization within normal limits. No rib lesion identified. Fracture of the distal third right clavicle with lucency and incomplete healing re- demonstrated. The left clavicle appears normal. Bone mineralization about the pelvis is stable and within normal limits. There are degenerative appearing femoral head and neck subchondral cysts re- demonstrated, greater on the right. Degenerative femoral head spurring greater on the right. Aside from the known right clavicle lesion, bilateral upper and lower extremity bone mineralization is within normal limits. There is calcified atherosclerosis in the distal left femoral artery. IMPRESSION: Aside from the known right clavicle lesion, bone mineralization is within normal limits. Degenerative changes in the spine and at both hips. Electronically Signed   By: Genevie Ann M.D.   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 I recommend we proceed with bone marrow aspirate and biopsy and he agreed to proceed. I will draw additional workup when I perform the biopsy  Closed right clavicular fracture  He has severe pain. I recommend a trial of narcotics prescription. I warned him about side effects and will assess his pain next week.   All questions were answered. The patient knows to call the clinic with any problems, questions or concerns. I spent 40 minutes counseling  the patient face to face. The total time spent in the appointment was 55 minutes and more than 50% was on counseling.     [facility], [place], MD 08/21/2015 4:51 PM

## 2015-08-21 NOTE — Assessment & Plan Note (Signed)
The patient had lytic lesion over the shoulder and presence of Bence Jones proteinemia, highly suspicious for multiple myeloma. I recommend we proceed with bone marrow aspirate and biopsy and he agreed to proceed. I will draw additional workup when I perform the biopsy

## 2015-08-24 ENCOUNTER — Other Ambulatory Visit: Payer: Self-pay | Admitting: Hematology and Oncology

## 2015-08-24 DIAGNOSIS — R803 Bence Jones proteinuria: Secondary | ICD-10-CM

## 2015-08-28 ENCOUNTER — Telehealth: Payer: Self-pay | Admitting: Hematology and Oncology

## 2015-08-28 ENCOUNTER — Ambulatory Visit (HOSPITAL_COMMUNITY)
Admission: RE | Admit: 2015-08-28 | Discharge: 2015-08-28 | Disposition: A | Discharge status: Home / Self Care | Payer: No Typology Code available for payment source | Source: Ambulatory Visit | Attending: Hematology and Oncology | Admitting: Hematology and Oncology

## 2015-08-28 ENCOUNTER — Other Ambulatory Visit: Payer: Self-pay | Admitting: Hematology and Oncology

## 2015-08-28 ENCOUNTER — Encounter (HOSPITAL_COMMUNITY): Payer: Self-pay

## 2015-08-28 VITALS — BP 118/88 | HR 74 | Temp 97.7°F | Resp 18 | Ht 66.0 in | Wt 171.2 lb

## 2015-08-28 DIAGNOSIS — C9000 Multiple myeloma not having achieved remission: Secondary | ICD-10-CM

## 2015-08-28 DIAGNOSIS — D72819 Decreased white blood cell count, unspecified: Secondary | ICD-10-CM | POA: Insufficient documentation

## 2015-08-28 DIAGNOSIS — M899 Disorder of bone, unspecified: Secondary | ICD-10-CM | POA: Insufficient documentation

## 2015-08-28 DIAGNOSIS — R803 Bence Jones proteinuria: Secondary | ICD-10-CM | POA: Insufficient documentation

## 2015-08-28 LAB — COMPREHENSIVE METABOLIC PANEL
ALBUMIN: 4.3 g/dL (ref 3.5–5.0)
ALK PHOS: 70 U/L (ref 38–126)
ALT: 41 U/L (ref 17–63)
AST: 29 U/L (ref 15–41)
Anion gap: 12 (ref 5–15)
BILIRUBIN TOTAL: 1.1 mg/dL (ref 0.3–1.2)
BUN: 30 mg/dL — AB (ref 6–20)
CALCIUM: 9.6 mg/dL (ref 8.9–10.3)
CO2: 24 mmol/L (ref 22–32)
Chloride: 105 mmol/L (ref 101–111)
Creatinine, Ser: 1.13 mg/dL (ref 0.61–1.24)
GFR calc Af Amer: >60 mL/min (ref >60)
GFR calc non Af Amer: >60 mL/min (ref >60)
GLUCOSE: 95 mg/dL (ref 65–99)
POTASSIUM: 4.3 mmol/L (ref 3.5–5.1)
Sodium: 141 mmol/L (ref 135–145)
TOTAL PROTEIN: 7.3 g/dL (ref 6.5–8.1)

## 2015-08-28 LAB — CBC WITH DIFFERENTIAL/PLATELET
BASOS ABS: 0 10*3/uL (ref 0.0–0.1)
BASOS PCT: 1 %
Eosinophils Absolute: 0.2 10*3/uL (ref 0.0–0.7)
Eosinophils Relative: 5 %
HEMATOCRIT: 41 % (ref 39.0–52.0)
HEMOGLOBIN: 13.9 g/dL (ref 13.0–17.0)
Lymphocytes Relative: 44 %
Lymphs Abs: 1.7 10*3/uL (ref 0.7–4.0)
MCH: 31.9 pg (ref 26.0–34.0)
MCHC: 33.9 g/dL (ref 30.0–36.0)
MCV: 94 fL (ref 78.0–100.0)
Monocytes Absolute: 0.4 10*3/uL (ref 0.1–1.0)
Monocytes Relative: 12 %
NEUTROS ABS: 1.4 10*3/uL — AB (ref 1.7–7.7)
NEUTROS PCT: 38 %
Platelets: 259 10*3/uL (ref 150–400)
RBC: 4.36 MIL/uL (ref 4.22–5.81)
RDW: 12.6 % (ref 11.5–15.5)
WBC: 3.8 10*3/uL — AB (ref 4.0–10.5)

## 2015-08-28 LAB — BONE MARROW EXAM

## 2015-08-28 MED ORDER — MIDAZOLAM HCL 5 MG/ML IJ SOLN
10.0000 mg | Freq: Once | INTRAMUSCULAR | Status: AC
  Administered 2015-08-28: 4 mg via INTRAVENOUS
  Filled 2015-08-28: qty 2

## 2015-08-28 MED ORDER — FENTANYL CITRATE (PF) 100 MCG/2ML IJ SOLN
100.0000 ug | Freq: Once | INTRAMUSCULAR | Status: AC
  Administered 2015-08-28: 25 ug via INTRAVENOUS
  Filled 2015-08-28: qty 2

## 2015-08-28 MED ORDER — SODIUM CHLORIDE 0.9 % IV SOLN
Freq: Once | INTRAVENOUS | Status: AC
  Administered 2015-08-28: 1000 mL via INTRAVENOUS

## 2015-08-28 NOTE — Sedation Documentation (Signed)
dsg cdi 

## 2015-08-28 NOTE — Telephone Encounter (Signed)
Pt's wife called to confirm the 1/18 appt.

## 2015-08-28 NOTE — Sedation Documentation (Signed)
Patient is resting comfortably. 

## 2015-08-28 NOTE — Sedation Documentation (Signed)
Family updated as to patient's status.

## 2015-08-28 NOTE — Sedation Documentation (Signed)
Patient denies pain and is resting comfortably.  

## 2015-08-28 NOTE — Discharge Instructions (Signed)
Bone Marrow Aspiration and Bone Marrow Biopsy, Care After Refer to this sheet in the next few weeks. These instructions provide you with information about caring for yourself after your procedure. Your health care provider may also give you more specific instructions. Your treatment has been planned according to current medical practices, but problems sometimes occur. Call your health care provider if you have any problems or questions after your procedure. WHAT TO EXPECT AFTER THE PROCEDURE After your procedure, it is common to have:  Soreness or tenderness around the puncture site.  Bruising. HOME CARE INSTRUCTIONS  Take medicines only as directed by your health care provider.  Follow your health care provider's instructions about:  Puncture site care.  Bandage (dressing) changes and removal.  Bathe and shower as directed by your health care provider.  Check your puncture site every day for signs of infection. Watch for:  Redness, swelling, or pain.  Fluid, blood, or pus.  Return to your normal activities as directed by your health care provider.  Keep all follow-up visits as directed by your health care provider. This is important. SEEK MEDICAL CARE IF:  You have a fever.  You have uncontrollable bleeding.  You have redness, swelling, or pain at the site of your puncture.  You have fluid, blood, or pus coming from your puncture site.   This information is not intended to replace advice given to you by your health care provider. Make sure you discuss any questions you have with your health care provider.   Document Released: 02/18/2005 Document Revised: 12/16/2014 Document Reviewed: 07/23/2014 Elsevier Interactive Patient Education 2016 [place] not drive  For 24 hours Do not go into public places today May resume your regular diet and take home medications as usual May experience small amount of tingling in leg (biopsy side) May take shower and remove  bandage in am For any questions or concerns, call dr If bleeding occurs at site, hold pressure x10 minutes  If continues, call doctor

## 2015-08-28 NOTE — Telephone Encounter (Signed)
1/18 appointment made per pof and per pof patient is aware

## 2015-08-28 NOTE — Sedation Documentation (Signed)
Medication dose calculated and verified for Erik Nguyen fmg versed/57mcg fentanyl

## 2015-08-28 NOTE — Sedation Documentation (Signed)
Vital signs stable. 

## 2015-08-28 NOTE — Procedures (Signed)
Brief examination was performed. ENT: adequate airway clearance Heart: regular rate and rhythm.No Murmurs Lungs: clear to auscultation, no wheezes, normal respiratory effort  American Society of Anesthesiologists ASA scale 1 Mallampati Score of 2  Bone Marrow Biopsy and Aspiration Procedure Note   Informed consent was obtained and potential risks including bleeding, infection and pain were reviewed with the patient. I verified that the patient has been fasting since midnight.  The patient's name, date of birth, identification, consent and allergies were verified prior to the start of procedure and time out was performed.   A total of 5 mg of IV Versed and 62.5 mcg of IV fentanyl were given.  The right posterior iliac crest was chosen as the site of biopsy.  The skin was prepped with Betadine solution.   8 cc of 1% lidocaine was used to provide local anaesthesia.   10 cc of bone marrow aspirate was obtained followed by 1 inch biopsy.   The procedure was tolerated well and there were no complications.  The patient was stable at the end of the procedure.  Specimens sent for flow cytometry, cytogenetics and additional studies.

## 2015-08-28 NOTE — Sedation Documentation (Signed)
dsg applied by md to sacral area

## 2015-08-31 LAB — PROTEIN ELECTROPHORESIS, SERUM
A/G Ratio: 1.1 (ref 0.7–1.7)
Albumin ELP: 3.8 g/dL (ref 2.9–4.4)
Alpha-1-Globulin: 0.3 g/dL (ref 0.0–0.4)
Alpha-2-Globulin: 1.1 g/dL — ABNORMAL HIGH (ref 0.4–1.0)
BETA GLOBULIN: 1.4 g/dL — AB (ref 0.7–1.3)
GAMMA GLOBULIN: 0.7 g/dL (ref 0.4–1.8)
Globulin, Total: 3.4 g/dL (ref 2.2–3.9)
M-SPIKE, %: 0.2 g/dL — AB
TOTAL PROTEIN ELP: 7.2 g/dL (ref 6.0–8.5)

## 2015-08-31 LAB — KAPPA/LAMBDA LIGHT CHAINS: KAPPA FREE LGHT CHN: 10.76 mg/L (ref 3.30–19.40)

## 2015-09-01 ENCOUNTER — Other Ambulatory Visit: Payer: Self-pay | Admitting: Hematology and Oncology

## 2015-09-01 ENCOUNTER — Encounter: Payer: Self-pay | Admitting: Hematology and Oncology

## 2015-09-01 DIAGNOSIS — C9000 Multiple myeloma not having achieved remission: Secondary | ICD-10-CM

## 2015-09-01 DIAGNOSIS — C9002 Multiple myeloma in relapse: Secondary | ICD-10-CM | POA: Insufficient documentation

## 2015-09-01 HISTORY — DX: Multiple myeloma not having achieved remission: C90.00

## 2015-09-01 LAB — IMMUNOFIXATION ELECTROPHORESIS
IGA: 61 mg/dL — AB (ref 90–386)
IGG (IMMUNOGLOBIN G), SERUM: 656 mg/dL — AB (ref 700–1600)
IGM, SERUM: 6 mg/dL — AB (ref 20–172)
TOTAL PROTEIN ELP: 6.8 g/dL (ref 6.0–8.5)

## 2015-09-02 ENCOUNTER — Other Ambulatory Visit: Payer: Self-pay | Admitting: *Deleted

## 2015-09-02 ENCOUNTER — Ambulatory Visit (HOSPITAL_BASED_OUTPATIENT_CLINIC_OR_DEPARTMENT_OTHER): Payer: No Typology Code available for payment source | Admitting: Hematology and Oncology

## 2015-09-02 ENCOUNTER — Encounter: Payer: Self-pay | Admitting: Hematology and Oncology

## 2015-09-02 ENCOUNTER — Telehealth: Payer: Self-pay | Admitting: Hematology and Oncology

## 2015-09-02 VITALS — BP 139/86 | HR 92 | Temp 98.2°F | Resp 18 | Wt 166.0 lb

## 2015-09-02 DIAGNOSIS — G893 Neoplasm related pain (acute) (chronic): Secondary | ICD-10-CM

## 2015-09-02 DIAGNOSIS — R803 Bence Jones proteinuria: Secondary | ICD-10-CM

## 2015-09-02 DIAGNOSIS — C9000 Multiple myeloma not having achieved remission: Secondary | ICD-10-CM

## 2015-09-02 DIAGNOSIS — R0789 Other chest pain: Secondary | ICD-10-CM

## 2015-09-02 MED ORDER — ACYCLOVIR 400 MG PO TABS: 400.0000 mg | ORAL_TABLET | Freq: Two times a day (BID) | ORAL | Status: DC

## 2015-09-02 MED ORDER — LENALIDOMIDE 10 MG PO CAPS: ORAL_CAPSULE | ORAL | Status: DC

## 2015-09-02 MED ORDER — TRAMADOL HCL 50 MG PO TABS: 50.0000 mg | ORAL_TABLET | Freq: Four times a day (QID) | ORAL | Status: DC | PRN

## 2015-09-02 MED ORDER — DEXAMETHASONE 4 MG PO TABS: ORAL_TABLET | ORAL | Status: DC

## 2015-09-02 MED ORDER — PROCHLORPERAZINE MALEATE 10 MG PO TABS: 10.0000 mg | ORAL_TABLET | Freq: Four times a day (QID) | ORAL | Status: DC | PRN

## 2015-09-02 MED ORDER — ONDANSETRON HCL 8 MG PO TABS: 8.0000 mg | ORAL_TABLET | Freq: Three times a day (TID) | ORAL | Status: DC | PRN

## 2015-09-02 MED FILL — DEXAMETHASONE 4 MG TABLET: 4 | 42 days supply | Qty: 60 | Fill #0

## 2015-09-02 MED FILL — ACYCLOVIR 400 MG TABLET: 400 | 30 days supply | Qty: 60 | Fill #0

## 2015-09-02 MED FILL — traMADol HCL 50 MG TABS: 50 | 15 days supply | Qty: 60 | Fill #0

## 2015-09-02 MED FILL — ONDANSETRON HCL 8 MG TABLET: 8 | 10 days supply | Qty: 30 | Fill #0

## 2015-09-02 MED FILL — PROCHLORPERAZINE 10 MG TAB: 10 | 8 days supply | Qty: 30 | Fill #0

## 2015-09-02 NOTE — Telephone Encounter (Signed)
Gv pt appts for Jan + Feb. °

## 2015-09-02 NOTE — Assessment & Plan Note (Addendum)
We discussed the natural history  And behavior of multiple myeloma.  We discussed the role of induction chemotherapy followed by possible autologous stem cell transplant and consolidation treatment.  we discussed the role of palliative radiation treatment.  with his young age and lack of comorbidities, I would like to give him induction chemotherapy with Revlimid, Velcade and dexamethasone.  The risks, benefit, side effects of Revlimid , Velcade and dexamethasone were fully discussed with the patient and he agreed to proceed. He obtained dental clearance before Zometa. Recommend him to start him on 81 mg aspirin therapy, acyclovir, calcium and vitamin D supplements. We will get him to come in for blood work on a weekly basis for close monitoring.  I plan to start him on for cycle treatment on 09/07/2015 but he can proceed to start dexamethasone tomorrow for bone pain.

## 2015-09-02 NOTE — Assessment & Plan Note (Signed)
He has severe pain over the right shoulder area related to recent fracture and cancer. We discussed the use of dexamethasone for bone pain. I will sstart him on 20 mg twice a week and reassess again of the month. I'll also give him prescription of tramadol. We discussed the risk, benefit, side effects and narcotic policy regarding tramadol. If he finds no benefit, I will refer her him for palliative radiation treatment

## 2015-09-02 NOTE — Progress Notes (Signed)
[facility] OFFICE PROGRESS NOTE  Patient Care Team: Boykin Nearing, MD as PCP - General (Family Medicine)  SUMMARY OF ONCOLOGIC HISTORY: Multiple myeloma: This patient had been complaining of  Right shoulder pain for the past month. He did not recall any trauma or injury.  it is worse with movement and at nighttime. He was prescribed some pain medicine and Voltaren gel with minimum relief. 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 He has chronic back pain for a long time. He was recommended physical therapy but due to lack of insurance that has not started. Thankfully, the patient is left-handed and was able to perform most activities of daily living.  Patient denies history of recurrent infection or atypical infections such as shingles of meningitis. Denies chills, night sweats, anorexia or abnormal weight loss.  he has significant evaluation including blood work, imaging study and urine test which detected lytic lesion and Bence Jones proteinemia. He is then referred here.  on 08/28/2015, bone marrow biopsy confirmed the diagnosis of multiple myeloma , lambda subtype with associated IgG, IgA or IgM  INTERVAL HISTORY: Please see below for problem oriented charting.  He continues to complain of severe bone pain over the right shoulder.  Is rated around 5-6 out of 10 pain. It is worse with movement and sneezing.  REVIEW OF SYSTEMS:   Constitutional: Denies fevers, chills or abnormal weight loss Eyes: Denies blurriness of vision Ears, nose, mouth, throat, and face: Denies mucositis or sore throat Respiratory: Denies cough, dyspnea or wheezes Cardiovascular: Denies palpitation, chest discomfort or lower extremity swelling Gastrointestinal:  Denies nausea, heartburn or change in bowel habits Skin: Denies abnormal skin rashes Lymphatics: Denies new lymphadenopathy or easy  bruising Neurological:Denies numbness, tingling or new weaknesses Behavioral/Psych: Mood is stable, no new changes  All other systems were reviewed with the patient and are negative.  I have reviewed the past medical history, past surgical history, social history and family history with the patient and they are unchanged from previous note.  ALLERGIES:  is allergic to latex.  MEDICATIONS:  Current Outpatient Prescriptions  Medication Sig Dispense Refill  . diclofenac sodium (VOLTAREN) 1 % GEL Apply 2 g topically 4 (four) times daily. 100 g 2  . lisinopril-hydrochlorothiazide (PRINZIDE) 10-12.5 MG per tablet Take 1 tablet by mouth daily. 30 tablet 11  . meloxicam (MOBIC) 15 MG tablet Take 1 tablet (15 mg total) by mouth daily. 30 tablet 0  . methocarbamol (ROBAXIN) 500 MG tablet Take 1 tablet (500 mg total) by mouth every 8 (eight) hours as needed for muscle spasms. 60 tablet 1  . acyclovir (ZOVIRAX) 400 MG tablet Take 1 tablet (400 mg total) by mouth 2 (two) times daily. 60 tablet 3  . dexamethasone (DECADRON) 4 MG tablet Take 5 tablets (20 mg) weekly on Mondays and Thursdays 60 tablet 3  . lenalidomide (REVLIMID) 10 MG capsule Take one capsule daily on days 1-14 every 21 days. 14 capsule 9  . ondansetron (ZOFRAN) 8 MG tablet Take 1 tablet (8 mg total) by mouth every 8 (eight) hours as needed (Nausea or vomiting). 30 tablet 1  . prochlorperazine (COMPAZINE) 10 MG tablet Take 1 tablet (10 mg total) by mouth every 6 (six) hours as needed (Nausea or vomiting). 30 tablet 1  . traMADol (ULTRAM) 50 MG tablet Take 1 tablet (50 mg total) by mouth every 6 (six) hours as needed. 60 tablet 0  No current facility-administered medications for this visit.    PHYSICAL EXAMINATION: ECOG PERFORMANCE STATUS: 1 - Symptomatic but completely ambulatory  Filed Vitals:   09/02/15 1404  BP: 139/86  Pulse: 92  Temp: 98.2 F (36.8 C)  Resp: 18   Filed Weights   09/02/15 1404  Weight: 166 lb (75.297 kg)     GENERAL:alert, no distress and comfortable SKIN: skin color, texture, turgor are normal, no rashes or significant lesions EYES: normal, Conjunctiva are pink and non-injected, sclera clear Musculoskeletal:no cyanosis of digits and no clubbing  NEURO: alert & oriented x 3 with fluent speech, no focal motor/sensory deficits  LABORATORY DATA:  I have reviewed the data as listed    Component Value Date/Time   NA 141 08/28/2015 0738   K 4.3 08/28/2015 0738   CL 105 08/28/2015 0738   CO2 24 08/28/2015 0738   GLUCOSE 95 08/28/2015 0738   BUN 30* 08/28/2015 0738   CREATININE 1.13 08/28/2015 0738   CREATININE 1.13 07/24/2015 1155   CALCIUM 9.6 08/28/2015 0738   PROT 7.3 08/28/2015 0738   ALBUMIN 4.3 08/28/2015 0738   AST 29 08/28/2015 0738   ALT 41 08/28/2015 0738   ALKPHOS 70 08/28/2015 0738   BILITOT 1.1 08/28/2015 0738   GFRNONAA >60 08/28/2015 0738   GFRNONAA 71 07/24/2015 1155   GFRAA >60 08/28/2015 0738   GFRAA 82 07/24/2015 1155    No results found for: SPEP, UPEP  Lab Results  Component Value Date   WBC 3.8* 08/28/2015   NEUTROABS 1.4* 08/28/2015   HGB 13.9 08/28/2015   HCT 41.0 08/28/2015   MCV 94.0 08/28/2015   PLT 259 08/28/2015      Chemistry      Component Value Date/Time   NA 141 08/28/2015 0738   K 4.3 08/28/2015 0738   CL 105 08/28/2015 0738   CO2 24 08/28/2015 0738   BUN 30* 08/28/2015 0738   CREATININE 1.13 08/28/2015 0738   CREATININE 1.13 07/24/2015 1155      Component Value Date/Time   CALCIUM 9.6 08/28/2015 0738   ALKPHOS 70 08/28/2015 0738   AST 29 08/28/2015 0738   ALT 41 08/28/2015 0738   BILITOT 1.1 08/28/2015 0738     ASSESSMENT & PLAN:  Multiple myeloma not having achieved remission ([place])  We discussed the natural history  And behavior of multiple myeloma.  We discussed the role of induction chemotherapy followed by possible autologous stem cell transplant and consolidation treatment.  we discussed the role of palliative  radiation treatment.  with his young age and lack of comorbidities, I would like to give him induction chemotherapy with Revlimid, Velcade and dexamethasone.  The risks, benefit, side effects of Revlimid , Velcade and dexamethasone were fully discussed with the patient and he agreed to proceed. He obtained dental clearance before Zometa. Recommend him to start him on 81 mg aspirin therapy, acyclovir, calcium and vitamin D supplements. We will get him to come in for blood work on a weekly basis for close monitoring.  I plan to start him on for cycle treatment on 09/07/2015 but he can proceed to start dexamethasone tomorrow for bone pain.  Cancer associated pain He has severe pain over the right shoulder area related to recent fracture and cancer. We discussed the use of dexamethasone for bone pain. I will sstart him on 20 mg twice a week and reassess again of the month. I'll also give him prescription of tramadol. We discussed the risk, benefit, side effects and  narcotic policy regarding tramadol. If he finds no benefit, I will refer her him for palliative radiation treatment   Orders Placed This Encounter  Procedures  . CBC with Differential    Standing Status: Standing     Number of Occurrences: 20     Standing Expiration Date: 09/02/2016  . Comprehensive metabolic panel    Standing Status: Standing     Number of Occurrences: 20     Standing Expiration Date: 09/02/2016  . PHYSICIAN COMMUNICATION ORDER    Order aspirin 81-379m OR Coumadin for thromboembolic prophylaxis via "add orders". Target Coumadin INR = 2-3.   All questions were answered. The patient knows to call the clinic with any problems, questions or concerns. No barriers to learning was detected. I spent 30 minutes counseling the patient face to face. The total time spent in the appointment was 40 minutes and more than 50% was on counseling and review of test results     [facility] [place] MD 09/02/2015 3:53 PM

## 2015-09-02 NOTE — Telephone Encounter (Signed)
Reviewed Consent forms for Revlimid/ [employer] w/ pt and his wife in office.   Pt enrolled in [employer] program.   Revlimid Rx given to Montel Clock,  Oral Chemo Navigator.

## 2015-09-03 ENCOUNTER — Encounter: Payer: Self-pay | Admitting: *Deleted

## 2015-09-03 ENCOUNTER — Other Ambulatory Visit: Payer: No Typology Code available for payment source

## 2015-09-03 NOTE — Progress Notes (Signed)
Patient in chemo class today reviewed side effects of Revlimid, Dexamethasone and Velcade  See Education notes

## 2015-09-07 ENCOUNTER — Ambulatory Visit (HOSPITAL_BASED_OUTPATIENT_CLINIC_OR_DEPARTMENT_OTHER): Payer: No Typology Code available for payment source

## 2015-09-07 ENCOUNTER — Other Ambulatory Visit (HOSPITAL_BASED_OUTPATIENT_CLINIC_OR_DEPARTMENT_OTHER): Payer: No Typology Code available for payment source

## 2015-09-07 ENCOUNTER — Encounter: Payer: Self-pay | Admitting: Hematology and Oncology

## 2015-09-07 VITALS — BP 130/82 | HR 89 | Temp 98.4°F | Resp 16

## 2015-09-07 DIAGNOSIS — R803 Bence Jones proteinuria: Secondary | ICD-10-CM

## 2015-09-07 DIAGNOSIS — Z5112 Encounter for antineoplastic immunotherapy: Secondary | ICD-10-CM

## 2015-09-07 DIAGNOSIS — C9000 Multiple myeloma not having achieved remission: Secondary | ICD-10-CM

## 2015-09-07 LAB — CBC WITH DIFFERENTIAL/PLATELET
BASO%: 1 % (ref 0.0–2.0)
Basophils Absolute: 0.1 10*3/uL (ref 0.0–0.1)
EOS%: 3.9 % (ref 0.0–7.0)
Eosinophils Absolute: 0.2 10*3/uL (ref 0.0–0.5)
HCT: 36.4 % — ABNORMAL LOW (ref 38.4–49.9)
HGB: 12.8 g/dL — ABNORMAL LOW (ref 13.0–17.1)
LYMPH%: 18.7 % (ref 14.0–49.0)
MCH: 31.9 pg (ref 27.2–33.4)
MCHC: 35.1 g/dL (ref 32.0–36.0)
MCV: 90.9 fL (ref 79.3–98.0)
MONO#: 0.3 10*3/uL (ref 0.1–0.9)
MONO%: 6.5 % (ref 0.0–14.0)
NEUT#: 3.7 10*3/uL (ref 1.5–6.5)
NEUT%: 69.9 % (ref 39.0–75.0)
Platelets: 230 10*3/uL (ref 140–400)
RBC: 4.01 10*6/uL — AB (ref 4.20–5.82)
RDW: 12.9 % (ref 11.0–14.6)
WBC: 5.3 10*3/uL (ref 4.0–10.3)
lymph#: 1 10*3/uL (ref 0.9–3.3)

## 2015-09-07 LAB — COMPREHENSIVE METABOLIC PANEL
ALT: 18 U/L (ref 0–55)
ANION GAP: 9 meq/L (ref 3–11)
AST: 15 U/L (ref 5–34)
Albumin: 3.7 g/dL (ref 3.5–5.0)
Alkaline Phosphatase: 75 U/L (ref 40–150)
BILIRUBIN TOTAL: 0.4 mg/dL (ref 0.20–1.20)
BUN: 15.3 mg/dL (ref 7.0–26.0)
CO2: 26 mEq/L (ref 22–29)
Calcium: 9.7 mg/dL (ref 8.4–10.4)
Chloride: 105 mEq/L (ref 98–109)
Creatinine: 1.2 mg/dL (ref 0.7–1.3)
EGFR: 74 mL/min/{1.73_m2} — AB (ref >90)
GLUCOSE: 107 mg/dL (ref 70–140)
POTASSIUM: 3.9 meq/L (ref 3.5–5.1)
Sodium: 140 mEq/L (ref 136–145)
Total Protein: 6.8 g/dL (ref 6.4–8.3)

## 2015-09-07 MED ORDER — PROCHLORPERAZINE MALEATE 10 MG PO TABS
ORAL_TABLET | ORAL | Status: AC
  Filled 2015-09-07: qty 1

## 2015-09-07 MED ORDER — PROCHLORPERAZINE MALEATE 10 MG PO TABS
10.0000 mg | ORAL_TABLET | Freq: Once | ORAL | Status: AC
Start: 2015-09-07 — End: 2015-09-07
  Administered 2015-09-07: 10 mg via ORAL

## 2015-09-07 MED ORDER — BORTEZOMIB CHEMO SQ INJECTION 3.5 MG (2.5MG/ML)
1.3000 mg/m2 | Freq: Once | INTRAMUSCULAR | Status: AC
  Administered 2015-09-07: 2.5 mg via SUBCUTANEOUS
  Filled 2015-09-07: qty 2.5

## 2015-09-07 NOTE — Patient Instructions (Signed)
[facility] Discharge Instructions for Patients Receiving Chemotherapy  Today you received the following chemotherapy agents: Velcade. Begin Revlimid this evening. Take one daily for 14 days, then 7 days off. Take your Decadron as prescribed. (5 tablets on Mon and Thurs)  To help prevent nausea and vomiting after your treatment, we encourage you to take your nausea medication: Zofran. Take one every 8 hours as needed. Continue Acyclovir twice daily.    If you develop nausea and vomiting that is not controlled by your nausea medication, call the clinic.   BELOW ARE SYMPTOMS THAT SHOULD BE REPORTED IMMEDIATELY:  *FEVER GREATER THAN 100.5 F  *CHILLS WITH OR WITHOUT FEVER  NAUSEA AND VOMITING THAT IS NOT CONTROLLED WITH YOUR NAUSEA MEDICATION  *UNUSUAL SHORTNESS OF BREATH  *UNUSUAL BRUISING OR BLEEDING  TENDERNESS IN MOUTH AND THROAT WITH OR WITHOUT PRESENCE OF ULCERS  *URINARY PROBLEMS  *BOWEL PROBLEMS  UNUSUAL RASH Items with * indicate a potential emergency and should be followed up as soon as possible.  Feel free to call the clinic should you have any questions or concerns. The clinic phone number is [phone].  Please show the [place] at check-in to the Emergency Department and triage nurse.

## 2015-09-07 NOTE — Progress Notes (Signed)
Introduced myself as his FA.  Pt would like to apply for MCD so I gave him an application to complete and submit to DSS.  He has the [facility] card so according to [facility] in billing he will not be charged for his treatments but left a msg for the rep that approved his card to return my call to get clarification.  I informed him of the [place].  He will bring his and his wife's paycheck stubs when he's ready to apply for the grant.  Pt has my card for any questions or concerns he may have.

## 2015-09-07 NOTE — Progress Notes (Signed)
Pt in for first Velcade injection. Reports he should get Revlimid delivery today. Reviewed medication instructions, pt began Acyclovir 1/22, has been taking once daily. Instructed him to increase to BID as prescribed. He voiced understanding.  Teach back complete re: Decadron, antiemetic and Revlimid.

## 2015-09-08 LAB — TISSUE HYBRIDIZATION (BONE MARROW)-NCBH

## 2015-09-08 LAB — IGD: IGD: 0.16 mg/dL (ref <14.11)

## 2015-09-08 LAB — CHROMOSOME ANALYSIS, BONE MARROW

## 2015-09-08 MED FILL — LISINOPRIL-HCTZ 10-12.5 MG: 10-12.5 | 30 days supply | Qty: 30 | Fill #9

## 2015-09-10 ENCOUNTER — Telehealth: Payer: Self-pay | Admitting: Family Medicine

## 2015-09-10 ENCOUNTER — Ambulatory Visit (HOSPITAL_BASED_OUTPATIENT_CLINIC_OR_DEPARTMENT_OTHER): Payer: No Typology Code available for payment source

## 2015-09-10 VITALS — BP 129/86 | HR 97 | Temp 98.6°F | Resp 20

## 2015-09-10 DIAGNOSIS — Z5112 Encounter for antineoplastic immunotherapy: Secondary | ICD-10-CM

## 2015-09-10 DIAGNOSIS — C9000 Multiple myeloma not having achieved remission: Secondary | ICD-10-CM

## 2015-09-10 DIAGNOSIS — R803 Bence Jones proteinuria: Secondary | ICD-10-CM

## 2015-09-10 MED ORDER — BORTEZOMIB CHEMO SQ INJECTION 3.5 MG (2.5MG/ML)
1.3000 mg/m2 | Freq: Once | INTRAMUSCULAR | Status: AC
  Administered 2015-09-10: 2.5 mg via SUBCUTANEOUS
  Filled 2015-09-10: qty 2.5

## 2015-09-10 NOTE — Patient Instructions (Signed)
Bortezomib injection What is this medicine? BORTEZOMIB (bor TEZ oh mib) is a medicine that targets proteins in cancer cells and stops the cancer cells from growing. It is used to treat multiple myeloma and mantle-cell lymphoma. This medicine may be used for other purposes; ask your health care provider or pharmacist if you have questions. What should I tell my health care provider before I take this medicine? They need to know if you have any of these conditions: -diabetes -heart disease -irregular heartbeat -liver disease -on hemodialysis -low blood counts, like low white blood cells, platelets, or hemoglobin -peripheral neuropathy -taking medicine for blood pressure -an unusual or allergic reaction to bortezomib, mannitol, boron, other medicines, foods, dyes, or preservatives -pregnant or trying to get pregnant -breast-feeding How should I use this medicine? This medicine is for injection into a vein or for injection under the skin. It is given by a health care professional in a hospital or clinic setting. Talk to your pediatrician regarding the use of this medicine in children. Special care may be needed. Overdosage: If you think you have taken too much of this medicine contact a poison control center or emergency room at once. NOTE: This medicine is only for you. Do not share this medicine with others. What if I miss a dose? It is important not to miss your dose. Call your doctor or health care professional if you are unable to keep an appointment. What may interact with this medicine? This medicine may interact with the following medications: -ketoconazole -rifampin -ritonavir -St. John's Wort This list may not describe all possible interactions. Give your health care provider a list of all the medicines, herbs, non-prescription drugs, or dietary supplements you use. Also tell them if you smoke, drink alcohol, or use illegal drugs. Some items may interact with your medicine. What  should I watch for while using this medicine? Visit your doctor for checks on your progress. This drug may make you feel generally unwell. This is not uncommon, as chemotherapy can affect healthy cells as well as cancer cells. Report any side effects. Continue your course of treatment even though you feel ill unless your doctor tells you to stop. You may get drowsy or dizzy. Do not drive, use machinery, or do anything that needs mental alertness until you know how this medicine affects you. Do not stand or sit up quickly, especially if you are an older patient. This reduces the risk of dizzy or fainting spells. In some cases, you may be given additional medicines to help with side effects. Follow all directions for their use. Call your doctor or health care professional for advice if you get a fever, chills or sore throat, or other symptoms of a cold or flu. Do not treat yourself. This drug decreases your body's ability to fight infections. Try to avoid being around people who are sick. This medicine may increase your risk to bruise or bleed. Call your doctor or health care professional if you notice any unusual bleeding. You may need blood work done while you are taking this medicine. In some patients, this medicine may cause a serious brain infection that may cause death. If you have any problems seeing, thinking, speaking, walking, or standing, tell your doctor right away. If you cannot reach your doctor, urgently seek other source of medical care. Do not become pregnant while taking this medicine. Women should inform their doctor if they wish to become pregnant or think they might be pregnant. There is a potential for serious  side effects to an unborn child. Talk to your health care professional or pharmacist for more information. Do not breast-feed an infant while taking this medicine. Check with your doctor or health care professional if you get an attack of severe diarrhea, nausea and vomiting, or if  you sweat a lot. The loss of too much body fluid can make it dangerous for you to take this medicine. What side effects may I notice from receiving this medicine? Side effects that you should report to your doctor or health care professional as soon as possible: -allergic reactions like skin rash, itching or hives, swelling of the face, lips, or tongue -breathing problems -changes in hearing -changes in vision -fast, irregular heartbeat -feeling faint or lightheaded, falls -pain, tingling, numbness in the hands or feet -right upper belly pain -seizures -swelling of the ankles, feet, hands -unusual bleeding or bruising -unusually weak or tired -vomiting -yellowing of the eyes or skin Side effects that usually do not require medical attention (report to your doctor or health care professional if they continue or are bothersome): -changes in emotions or moods -constipation -diarrhea -loss of appetite -headache -irritation at site where injected -nausea This list may not describe all possible side effects. Call your doctor for medical advice about side effects. You may report side effects to FDA at 1-800-FDA-1088. Where should I keep my medicine? This drug is given in a hospital or clinic and will not be stored at home. NOTE: This sheet is a summary. It may not cover all possible information. If you have questions about this medicine, talk to your doctor, pharmacist, or health care provider.    2016, Elsevier/Gold Standard. (2014-09-30 14:47:04)

## 2015-09-11 ENCOUNTER — Encounter: Payer: Self-pay | Admitting: Pharmacist

## 2015-09-11 NOTE — Progress Notes (Signed)
Oral Chemotherapy Pharmacist Encounter   I spoke with patient for overview of new oral chemotherapy medication: Revlimid. Pt is doing well. The prescriptions have been sent to the [facility]. Pt received on 09/07/15. Patient assistance was obtained for patient and he received free 1 month temporary supply while his financial assistance for the revlimid is finalized  Counseled patient on administration, dosing, side effects, safe handling, and monitoring. Side effects include but not limited to: Myelosuppression, fatigue, upset stomach, rash, and infection.  Erik Nguyen voiced understanding and appreciation.   All questions answered.  Will follow up in 1-2 weeks for adherence and toxicity management.   Thank you,  Montel Clock, PharmD, [facility]

## 2015-09-12 ENCOUNTER — Inpatient Hospital Stay (HOSPITAL_COMMUNITY)
Admission: EM | Admit: 2015-09-12 | Discharge: 2015-09-16 | DRG: 193 | Disposition: A | Discharge status: Home / Self Care | Payer: Medicaid Other | Attending: Internal Medicine | Admitting: Internal Medicine

## 2015-09-12 ENCOUNTER — Other Ambulatory Visit: Payer: Self-pay

## 2015-09-12 ENCOUNTER — Encounter (HOSPITAL_COMMUNITY): Payer: Self-pay

## 2015-09-12 ENCOUNTER — Emergency Department (HOSPITAL_COMMUNITY): Payer: Self-pay

## 2015-09-12 DIAGNOSIS — G893 Neoplasm related pain (acute) (chronic): Secondary | ICD-10-CM | POA: Diagnosis not present

## 2015-09-12 DIAGNOSIS — I959 Hypotension, unspecified: Secondary | ICD-10-CM | POA: Diagnosis present

## 2015-09-12 DIAGNOSIS — Z91040 Latex allergy status: Secondary | ICD-10-CM

## 2015-09-12 DIAGNOSIS — R42 Dizziness and giddiness: Secondary | ICD-10-CM | POA: Diagnosis not present

## 2015-09-12 DIAGNOSIS — K209 Esophagitis, unspecified without bleeding: Secondary | ICD-10-CM | POA: Diagnosis present

## 2015-09-12 DIAGNOSIS — R509 Fever, unspecified: Secondary | ICD-10-CM

## 2015-09-12 DIAGNOSIS — N179 Acute kidney failure, unspecified: Secondary | ICD-10-CM | POA: Diagnosis present

## 2015-09-12 DIAGNOSIS — J189 Pneumonia, unspecified organism: Principal | ICD-10-CM | POA: Diagnosis present

## 2015-09-12 DIAGNOSIS — E860 Dehydration: Secondary | ICD-10-CM | POA: Diagnosis present

## 2015-09-12 DIAGNOSIS — Z8042 Family history of malignant neoplasm of prostate: Secondary | ICD-10-CM

## 2015-09-12 DIAGNOSIS — Z7952 Long term (current) use of systemic steroids: Secondary | ICD-10-CM

## 2015-09-12 DIAGNOSIS — Z825 Family history of asthma and other chronic lower respiratory diseases: Secondary | ICD-10-CM

## 2015-09-12 DIAGNOSIS — I10 Essential (primary) hypertension: Secondary | ICD-10-CM | POA: Diagnosis not present

## 2015-09-12 DIAGNOSIS — Z79899 Other long term (current) drug therapy: Secondary | ICD-10-CM

## 2015-09-12 DIAGNOSIS — C9000 Multiple myeloma not having achieved remission: Secondary | ICD-10-CM | POA: Diagnosis present

## 2015-09-12 DIAGNOSIS — C9002 Multiple myeloma in relapse: Secondary | ICD-10-CM | POA: Diagnosis present

## 2015-09-12 DIAGNOSIS — T451X5A Adverse effect of antineoplastic and immunosuppressive drugs, initial encounter: Secondary | ICD-10-CM | POA: Diagnosis present

## 2015-09-12 DIAGNOSIS — D61810 Antineoplastic chemotherapy induced pancytopenia: Secondary | ICD-10-CM | POA: Diagnosis not present

## 2015-09-12 DIAGNOSIS — Z87891 Personal history of nicotine dependence: Secondary | ICD-10-CM

## 2015-09-12 DIAGNOSIS — R7989 Other specified abnormal findings of blood chemistry: Secondary | ICD-10-CM | POA: Diagnosis present

## 2015-09-12 DIAGNOSIS — M898X9 Other specified disorders of bone, unspecified site: Secondary | ICD-10-CM

## 2015-09-12 DIAGNOSIS — K5909 Other constipation: Secondary | ICD-10-CM | POA: Diagnosis present

## 2015-09-12 DIAGNOSIS — J9811 Atelectasis: Secondary | ICD-10-CM | POA: Diagnosis present

## 2015-09-12 DIAGNOSIS — Z7982 Long term (current) use of aspirin: Secondary | ICD-10-CM

## 2015-09-12 DIAGNOSIS — K5900 Constipation, unspecified: Secondary | ICD-10-CM | POA: Diagnosis present

## 2015-09-12 LAB — COMPREHENSIVE METABOLIC PANEL
ALT: 37 U/L (ref 17–63)
ANION GAP: 11 (ref 5–15)
AST: 34 U/L (ref 15–41)
Albumin: 4.2 g/dL (ref 3.5–5.0)
Alkaline Phosphatase: 61 U/L (ref 38–126)
BUN: 17 mg/dL (ref 6–20)
CHLORIDE: 97 mmol/L — AB (ref 101–111)
CO2: 27 mmol/L (ref 22–32)
CREATININE: 1.54 mg/dL — AB (ref 0.61–1.24)
Calcium: 9.7 mg/dL (ref 8.9–10.3)
GFR, EST AFRICAN AMERICAN: 55 mL/min — AB (ref >60)
GFR, EST NON AFRICAN AMERICAN: 48 mL/min — AB (ref >60)
Glucose, Bld: 97 mg/dL (ref 65–99)
POTASSIUM: 4 mmol/L (ref 3.5–5.1)
Sodium: 135 mmol/L (ref 135–145)
Total Bilirubin: 0.6 mg/dL (ref 0.3–1.2)
Total Protein: 6.9 g/dL (ref 6.5–8.1)

## 2015-09-12 LAB — CBC WITH DIFFERENTIAL/PLATELET
BASOS PCT: 0 %
Basophils Absolute: 0 10*3/uL (ref 0.0–0.1)
EOS ABS: 0.1 10*3/uL (ref 0.0–0.7)
EOS PCT: 1 %
HEMATOCRIT: 36.2 % — AB (ref 39.0–52.0)
HEMOGLOBIN: 12.6 g/dL — AB (ref 13.0–17.0)
LYMPHS PCT: 18 %
Lymphs Abs: 1 10*3/uL (ref 0.7–4.0)
MCH: 32.1 pg (ref 26.0–34.0)
MCHC: 34.8 g/dL (ref 30.0–36.0)
MCV: 92.1 fL (ref 78.0–100.0)
MONOS PCT: 12 %
Monocytes Absolute: 0.6 10*3/uL (ref 0.1–1.0)
NEUTROS PCT: 69 %
Neutro Abs: 3.7 10*3/uL (ref 1.7–7.7)
Platelets: 157 10*3/uL (ref 150–400)
RBC: 3.93 MIL/uL — ABNORMAL LOW (ref 4.22–5.81)
RDW: 12.7 % (ref 11.5–15.5)
WBC: 5.4 10*3/uL (ref 4.0–10.5)

## 2015-09-12 LAB — URINALYSIS, ROUTINE W REFLEX MICROSCOPIC
Bilirubin Urine: NEGATIVE
GLUCOSE, UA: NEGATIVE mg/dL
Hgb urine dipstick: NEGATIVE
Ketones, ur: NEGATIVE mg/dL
LEUKOCYTES UA: NEGATIVE
NITRITE: NEGATIVE
PH: 6 (ref 5.0–8.0)
Protein, ur: NEGATIVE mg/dL
SPECIFIC GRAVITY, URINE: 1.02 (ref 1.005–1.030)

## 2015-09-12 LAB — POCT I-STAT TROPONIN I: Troponin i, poc: 0.01 ng/mL (ref 0.00–0.08)

## 2015-09-12 LAB — I-STAT CG4 LACTIC ACID, ED: LACTIC ACID, VENOUS: 0.93 mmol/L (ref 0.5–2.0)

## 2015-09-12 LAB — INFLUENZA PANEL BY PCR (TYPE A & B)
H1N1FLUPCR: NOT DETECTED
Influenza A By PCR: NEGATIVE
Influenza B By PCR: NEGATIVE

## 2015-09-12 MED ORDER — ENOXAPARIN SODIUM 40 MG/0.4ML ~~LOC~~ SOLN
40.0000 mg | SUBCUTANEOUS | Status: DC
  Administered 2015-09-12 – 2015-09-13 (×2): 40 mg via SUBCUTANEOUS
  Filled 2015-09-12 (×2): qty 0.4

## 2015-09-12 MED ORDER — ONDANSETRON HCL 4 MG/2ML IJ SOLN
4.0000 mg | Freq: Four times a day (QID) | INTRAMUSCULAR | Status: DC | PRN
  Administered 2015-09-14: 4 mg via INTRAVENOUS
  Filled 2015-09-12: qty 2

## 2015-09-12 MED ORDER — HYDROMORPHONE HCL 1 MG/ML IJ SOLN
1.0000 mg | INTRAMUSCULAR | Status: DC | PRN
  Administered 2015-09-12 – 2015-09-13 (×4): 1 mg via INTRAVENOUS
  Filled 2015-09-12 (×4): qty 1

## 2015-09-12 MED ORDER — IOHEXOL 300 MG/ML  SOLN
50.0000 mL | Freq: Once | INTRAMUSCULAR | Status: AC | PRN
  Administered 2015-09-12: 50 mL via ORAL

## 2015-09-12 MED ORDER — LEVOFLOXACIN IN D5W 500 MG/100ML IV SOLN
500.0000 mg | INTRAVENOUS | Status: DC
  Administered 2015-09-12: 500 mg via INTRAVENOUS
  Filled 2015-09-12: qty 100

## 2015-09-12 MED ORDER — DICLOFENAC SODIUM 1 % TD GEL
2.0000 g | Freq: Four times a day (QID) | TRANSDERMAL | Status: DC
  Administered 2015-09-12 – 2015-09-16 (×9): 2 g via TOPICAL
  Filled 2015-09-12: qty 100

## 2015-09-12 MED ORDER — TRAMADOL HCL 50 MG PO TABS
50.0000 mg | ORAL_TABLET | Freq: Four times a day (QID) | ORAL | Status: DC | PRN
  Administered 2015-09-16: 50 mg via ORAL
  Filled 2015-09-12: qty 1

## 2015-09-12 MED ORDER — ACETAMINOPHEN 325 MG PO TABS
650.0000 mg | ORAL_TABLET | Freq: Once | ORAL | Status: AC | PRN
  Administered 2015-09-12: 650 mg via ORAL
  Filled 2015-09-12: qty 2

## 2015-09-12 MED ORDER — ASPIRIN EC 81 MG PO TBEC
81.0000 mg | DELAYED_RELEASE_TABLET | Freq: Every day | ORAL | Status: DC
  Administered 2015-09-13 – 2015-09-16 (×4): 81 mg via ORAL
  Filled 2015-09-12 (×5): qty 1

## 2015-09-12 MED ORDER — ACETAMINOPHEN 325 MG PO TABS
650.0000 mg | ORAL_TABLET | Freq: Four times a day (QID) | ORAL | Status: DC | PRN
  Administered 2015-09-12: 650 mg via ORAL
  Filled 2015-09-12 (×2): qty 2

## 2015-09-12 MED ORDER — FENTANYL CITRATE (PF) 100 MCG/2ML IJ SOLN: 50.0000 ug | Freq: Once | INTRAMUSCULAR | Status: DC

## 2015-09-12 MED ORDER — ACYCLOVIR 400 MG PO TABS
400.0000 mg | ORAL_TABLET | Freq: Two times a day (BID) | ORAL | Status: DC
  Administered 2015-09-12 – 2015-09-16 (×8): 400 mg via ORAL
  Filled 2015-09-12 (×8): qty 1

## 2015-09-12 MED ORDER — IOHEXOL 300 MG/ML  SOLN
100.0000 mL | Freq: Once | INTRAMUSCULAR | Status: AC | PRN
  Administered 2015-09-12: 100 mL via INTRAVENOUS

## 2015-09-12 MED ORDER — SODIUM CHLORIDE 0.9 % IV SOLN
INTRAVENOUS | Status: DC
  Administered 2015-09-12: 12:00:00 via INTRAVENOUS
  Administered 2015-09-13: 1000 mL via INTRAVENOUS

## 2015-09-12 MED ORDER — PROCHLORPERAZINE MALEATE 10 MG PO TABS: 10.0000 mg | ORAL_TABLET | Freq: Four times a day (QID) | ORAL | Status: DC | PRN

## 2015-09-12 MED ORDER — VANCOMYCIN HCL IN DEXTROSE 1-5 GM/200ML-% IV SOLN
1000.0000 mg | Freq: Once | INTRAVENOUS | Status: AC
  Administered 2015-09-12: 1000 mg via INTRAVENOUS
  Filled 2015-09-12: qty 200

## 2015-09-12 MED ORDER — DEXTROSE 5 % IV SOLN
2.0000 g | Freq: Once | INTRAVENOUS | Status: AC
  Administered 2015-09-12: 2 g via INTRAVENOUS
  Filled 2015-09-12: qty 2

## 2015-09-12 MED ORDER — ONDANSETRON HCL 4 MG PO TABS: 4.0000 mg | ORAL_TABLET | Freq: Four times a day (QID) | ORAL | Status: DC | PRN

## 2015-09-12 MED ORDER — VITAMIN D 1000 UNITS PO TABS
2000.0000 [IU] | ORAL_TABLET | Freq: Every day | ORAL | Status: DC
  Administered 2015-09-13 – 2015-09-16 (×4): 2000 [IU] via ORAL
  Filled 2015-09-12 (×6): qty 2

## 2015-09-12 MED ORDER — SODIUM CHLORIDE 0.9 % IV BOLUS (SEPSIS)
1000.0000 mL | Freq: Once | INTRAVENOUS | Status: AC
  Administered 2015-09-12: 1000 mL via INTRAVENOUS

## 2015-09-12 MED ORDER — ACETAMINOPHEN 650 MG RE SUPP: 650.0000 mg | Freq: Four times a day (QID) | RECTAL | Status: DC | PRN

## 2015-09-12 NOTE — ED Notes (Addendum)
Pt reports awoke 0500 with headache, chills, and fever. Two attempts to start IV/draw labs unsuccessful; Orvilla Fus and Junious Silk attempting IV/draw labs at present time.

## 2015-09-12 NOTE — H&P (Signed)
[facility] History and Physical  Baby Stairs GHW:[id number] DOB: 08-18-1955 DOA: 09/12/2015  Referring physician: EDP PCP: Minerva Ends, MD   Chief Complaint: fever  HPI: Erik Nguyen is a 60 y.o. male with PMH of HTN, was recently diagnosed with multiple myeloma, he was started on Remicaide and received his first Velcade injection on 1/23. He presented to the ER with the above complaints. Pt reports being in his usual state of health, till yesterday, hes had a dry cough for a few days, developed a fever last pm and then again today, associated with chills. Also reported lower abd pain, no N/V or diarrhea. In ER, temp of 102.4,CXR with atelectasis, CT abd without acute findings, TRH consulted  Review of Systems: positives bolded Constitutional:  No weight loss, night sweats, Fevers, chills, fatigue.  HEENT:  No headaches, Difficulty swallowing,Tooth/dental problems,Sore throat,  No sneezing, itching, ear ache, nasal congestion, post nasal drip,  Cardio-vascular:  No chest pain, Orthopnea, PND, swelling in lower extremities, anasarca, dizziness, palpitations  GI:  No heartburn, indigestion, abdominal pain, nausea, vomiting, diarrhea, change in bowel habits, loss of appetite  Resp:  No shortness of breath with exertion or at rest. No excess mucus, no productive cough, No non-productive cough, No coughing up of blood.No change in color of mucus.No wheezing.No chest wall deformity  Skin:  no rash or lesions.  GU:  no dysuria, change in color of urine, no urgency or frequency. No flank pain.  Musculoskeletal:  No joint pain or swelling. No decreased range of motion. No back pain.  Psych:  No change in mood or affect. No depression or anxiety. No memory loss.   Past Medical History  Diagnosis Date  . Priapism 2014   . Hypertension Dx 2016  . Multiple myeloma not having achieved remission ([place]) 09/01/2015   Past Surgical History  Procedure Laterality Date  .  Colonoscopy    . Polypectomy     Social History:  reports that he has quit smoking. His smoking use included Cigarettes. He smoked 1.00 pack per day. He has never used smokeless tobacco. He reports that he uses illicit drugs (Cocaine and "Crack" cocaine) about 7 times per week. He reports that he does not drink alcohol.  Allergies  Allergen Reactions  . Latex Itching and Rash    Family History  Problem Relation Age of Onset  . Asthma Mother   . Cancer Father   . Prostate cancer Father   . Colon cancer Neg Hx   . Rectal cancer Neg Hx   . Stomach cancer Neg Hx     Prior to Admission medications   Medication Sig Start Date End Date Taking? Authorizing Provider  acyclovir (ZOVIRAX) 400 MG tablet Take 1 tablet (400 mg total) by mouth 2 (two) times daily. 09/02/15  Yes Heath Lark, MD  aspirin EC 81 MG tablet Take 81 mg by mouth daily.   Yes Historical Provider, MD  cholecalciferol (VITAMIN D) 1000 units tablet Take 2,000 Units by mouth daily.   Yes Historical Provider, MD  dexamethasone (DECADRON) 4 MG tablet Take 5 tablets (20 mg) weekly on Mondays and Thursdays 09/02/15  Yes Heath Lark, MD  diclofenac sodium (VOLTAREN) 1 % GEL Apply 2 g topically 4 (four) times daily. 07/17/15  Yes Josalyn Funches, MD  lenalidomide (REVLIMID) 10 MG capsule Take one capsule daily on days 1-14 every 21 days. 09/02/15  Yes Heath Lark, MD  lisinopril-hydrochlorothiazide (PRINZIDE) 10-12.5 MG per tablet Take 1 tablet by mouth daily. 11/06/14  Yes Boykin Nearing, MD  meloxicam (MOBIC) 15 MG tablet Take 1 tablet (15 mg total) by mouth daily. 07/17/15  Yes Josalyn Funches, MD  ondansetron (ZOFRAN) 8 MG tablet Take 1 tablet (8 mg total) by mouth every 8 (eight) hours as needed (Nausea or vomiting). 09/02/15  Yes Heath Lark, MD  prochlorperazine (COMPAZINE) 10 MG tablet Take 1 tablet (10 mg total) by mouth every 6 (six) hours as needed (Nausea or vomiting). 09/02/15  Yes Heath Lark, MD  traMADol (ULTRAM) 50 MG tablet Take  1 tablet (50 mg total) by mouth every 6 (six) hours as needed. 09/02/15  Yes Heath Lark, MD  methocarbamol (ROBAXIN) 500 MG tablet Take 1 tablet (500 mg total) by mouth every 8 (eight) hours as needed for muscle spasms. Patient not taking: Reported on 09/12/2015 07/17/15   Boykin Nearing, MD   Physical Exam: Filed Vitals:   09/12/15 1115 09/12/15 1130 09/12/15 1135 09/12/15 1220  BP: 125/83 120/81  111/76  Pulse: 103 106  109  Temp:   99.5 F (37.5 C) 100 F (37.8 C)  TempSrc:   Oral Oral  Resp: 16 15  16   Height:    5' 6"  (1.676 m)  Weight:    75.1 kg (165 lb 9.1 oz)  SpO2: 97% 98%  99%    Wt Readings from Last 3 Encounters:  09/12/15 75.1 kg (165 lb 9.1 oz)  09/02/15 75.297 kg (166 lb)  08/28/15 77.656 kg (171 lb 3.2 oz)    General:  Appears calm and comfortable, no distress,AAOx3 Eyes: PERRL, normal lids, irises & conjunctiva ENT: grossly normal hearing, lips & tongue Neck: no LAD, masses or thyromegaly Cardiovascular: RRR, no m/r/g. No LE edema. Telemetry: SR, no arrhythmias  Respiratory: CTA bilaterally, no w/r/r. Normal respiratory effort. Abdomen: soft, nt nd, BS present Skin: no rash or induration seen on limited exam Musculoskeletal: grossly normal tone BUE/BLE Psychiatric: grossly normal mood and affect, speech fluent and appropriate Neurologic: grossly non-focal.          Labs on Admission:  Basic Metabolic Panel:  Recent Labs Lab 09/07/15 0801 09/12/15 0738  NA 140 135  K 3.9 4.0  CL  --  97*  CO2 26 27  GLUCOSE 107 97  BUN 15.3 17  CREATININE 1.2 1.54*  CALCIUM 9.7 9.7   Liver Function Tests:  Recent Labs Lab 09/07/15 0801 09/12/15 0738  AST 15 34  ALT 18 37  ALKPHOS 75 61  BILITOT 0.40 0.6  PROT 6.8 6.9  ALBUMIN 3.7 4.2   No results for input(s): LIPASE, AMYLASE in the last 168 hours. No results for input(s): AMMONIA in the last 168 hours. CBC:  Recent Labs Lab 09/07/15 0801 09/12/15 0738  WBC 5.3 5.4  NEUTROABS 3.7 3.7  HGB  12.8* 12.6*  HCT 36.4* 36.2*  MCV 90.9 92.1  PLT 230 157   Cardiac Enzymes: No results for input(s): CKTOTAL, CKMB, CKMBINDEX, TROPONINI in the last 168 hours.  BNP (last 3 results) No results for input(s): BNP in the last 8760 hours.  ProBNP (last 3 results) No results for input(s): PROBNP in the last 8760 hours.  CBG: No results for input(s): GLUCAP in the last 168 hours.  Radiological Exams on Admission: Dg Chest 2 View  09/12/2015  CLINICAL DATA:  Cough and fever with shortness of Breath EXAM: CHEST  2 VIEW COMPARISON:  07/17/2015 FINDINGS: Cardiac shadow is stable. The lungs are well aerated bilaterally. Very minimal left basilar atelectasis is noted. No focal confluent  infiltrate or sizable effusion is noted. No bony abnormality is seen. IMPRESSION: Minimal left basilar atelectasis. Electronically Signed   By: Inez Catalina M.D.   On: 09/12/2015 07:26   Ct Abdomen Pelvis W Contrast  09/12/2015  CLINICAL DATA:  Fever, abdominal pain, cough and shortness of breath. Multiple myeloma. EXAM: CT ABDOMEN AND PELVIS WITH CONTRAST TECHNIQUE: Multidetector CT imaging of the abdomen and pelvis was performed using the standard protocol following bolus administration of intravenous contrast. CONTRAST:  91m OMNIPAQUE IOHEXOL 300 MG/ML SOLN, 1070mOMNIPAQUE IOHEXOL 300 MG/ML SOLN COMPARISON:  12/28/2014 FINDINGS: Circumferential wall thickening in the distal esophagus again noted, similar to prior study. Stable paraesophageal lymph node. Heart is normal size. Dependent opacities in the lung bases, most likely atelectasis. No pleural effusions. Mild diffuse fatty infiltration of the liver. No focal abnormality. Gallbladder, spleen, pancreas, adrenals and kidneys are unremarkable. Appendix is visualized and is normal. Scattered colonic diverticula. No active diverticulitis. Stomach and small bowel are decompressed. No free fluid, free air or adenopathy. Urinary bladder is unremarkable. Aorta is normal  caliber. Large lucent lesion within the T10 vertebral body, new since prior study. Smaller lucent areas in T11 and T12 and scattered throughout the lumbar spine, new since prior study, compatible with myeloma. Lucent areas noted within the proximal femurs bilaterally. IMPRESSION: No acute findings in the abdomen or pelvis. Scattered diverticula of the colon without active diverticulitis. Dependent opacities in lung bases, likely atelectasis. Mild circumferential wall thickening of the distal esophagus, possibly esophagitis. Mild fatty infiltration of the liver. New lucent areas throughout the visualized thoracic and lumbar spine as well as proximal femurs compatible with given history of myeloma. These are new or worsened since prior study. Electronically Signed   By: KeRolm Baptise.D.   On: 09/12/2015 10:00    EKG: Independently reviewed. Sinus tachycardia, no acute ST t wave changes  Assessment/Plan   Fever -could be related to bronchitis, cough ? Early pneumonia -CXR with atelectasis only -CT abd and UA unremarkable -FU influenza PCR -Empiric Levaquin for now, received Vanc and Cefepime in ER    HTN (hypertension) -stable, hold lisinopril    Multiple myeloma not having achieved remission -on remicaide, s/p Velcade injection on 1/23    Cancer associated pain -add Iv dilaudid for today, on tramadol PRN at home  DVT Prophylaxis:lovenox  Full Code Family Communication: wife at bedside Disposition Plan: home when stable  Time spent: 6069m Tracie Lindbloom [facility] Pager [phone]

## 2015-09-12 NOTE — ED Notes (Addendum)
Pt c/o of fever, abd pain, cough, sob and presents to ED with oral temp of 102.4. Pt has hx of Multiple Myeloma CA. Pt last Chemo treatment was this past Thursday.  Pt A+OX4, speaking in complete sentences.

## 2015-09-12 NOTE — ED Provider Notes (Signed)
CSN: [id number]     Arrival date & time 09/12/15  9485 History   First MD Initiated Contact with Patient 09/12/15 0701     Chief Complaint  Patient presents with  . Chemo Card   . Fever     Patient is a 60 y.o. male presenting with fever. The history is provided by the patient. No language interpreter was used.  Fever  Erik Nguyen is a 60 y.o. male who presents to the Emergency Department complaining of fever and chills. He has a history of multiple myeloma and is undergoing chemotherapy. He reports a cough for the last couple of days and last night developed shaking chills and fever. His associated malaise. He has some left-sided abdominal pain. He denies any nasal congestion, sore throat, nausea, vomiting, dysuria, diarrhea, rash. Symptoms are moderate and constant in nature.  Past Medical History  Diagnosis Date  . Priapism 2014   . Hypertension Dx 2016  . Multiple myeloma not having achieved remission ([place]) 09/01/2015   Past Surgical History  Procedure Laterality Date  . Colonoscopy    . Polypectomy     Family History  Problem Relation Age of Onset  . Asthma Mother   . Cancer Father   . Prostate cancer Father   . Colon cancer Neg Hx   . Rectal cancer Neg Hx   . Stomach cancer Neg Hx    Social History  Substance Use Topics  . Smoking status: Former Smoker -- 1.00 packs/day    Types: Cigarettes  . Smokeless tobacco: Never Used  . Alcohol Use: No     Comment: last use Jan 2015    Review of Systems  Constitutional: Positive for fever.  All other systems reviewed and are negative.     Allergies  Latex  Home Medications   Prior to Admission medications   Medication Sig Start Date End Date Taking? Authorizing Provider  acyclovir (ZOVIRAX) 400 MG tablet Take 1 tablet (400 mg total) by mouth 2 (two) times daily. 09/02/15  Yes Heath Lark, MD  aspirin EC 81 MG tablet Take 81 mg by mouth daily.   Yes Historical Provider, MD  cholecalciferol (VITAMIN D) 1000 units  tablet Take 2,000 Units by mouth daily.   Yes Historical Provider, MD  dexamethasone (DECADRON) 4 MG tablet Take 5 tablets (20 mg) weekly on Mondays and Thursdays 09/02/15  Yes Heath Lark, MD  diclofenac sodium (VOLTAREN) 1 % GEL Apply 2 g topically 4 (four) times daily. 07/17/15  Yes Josalyn Funches, MD  lenalidomide (REVLIMID) 10 MG capsule Take one capsule daily on days 1-14 every 21 days. 09/02/15  Yes Heath Lark, MD  lisinopril-hydrochlorothiazide (PRINZIDE) 10-12.5 MG per tablet Take 1 tablet by mouth daily. 11/06/14  Yes Josalyn Funches, MD  meloxicam (MOBIC) 15 MG tablet Take 1 tablet (15 mg total) by mouth daily. 07/17/15  Yes Josalyn Funches, MD  ondansetron (ZOFRAN) 8 MG tablet Take 1 tablet (8 mg total) by mouth every 8 (eight) hours as needed (Nausea or vomiting). 09/02/15  Yes Heath Lark, MD  prochlorperazine (COMPAZINE) 10 MG tablet Take 1 tablet (10 mg total) by mouth every 6 (six) hours as needed (Nausea or vomiting). 09/02/15  Yes Heath Lark, MD  traMADol (ULTRAM) 50 MG tablet Take 1 tablet (50 mg total) by mouth every 6 (six) hours as needed. 09/02/15  Yes Heath Lark, MD  methocarbamol (ROBAXIN) 500 MG tablet Take 1 tablet (500 mg total) by mouth every 8 (eight) hours as needed for muscle spasms.  Patient not taking: Reported on 09/12/2015 07/17/15   Josalyn Funches, MD   BP 130/78 mmHg  Pulse 109  Temp(Src) 101 F (38.3 C) (Oral)  Resp 16  Ht 5' 6"  (1.676 m)  Wt 165 lb 9.1 oz (75.1 kg)  BMI 26.74 kg/m2  SpO2 99% Physical Exam  Constitutional: He is oriented to person, place, and time. He appears well-developed and well-nourished.  HENT:  Head: Normocephalic and atraumatic.  Cardiovascular: Normal rate and regular rhythm.   No murmur heard. Pulmonary/Chest: Effort normal and breath sounds normal. No respiratory distress.  Abdominal: Soft. There is no tenderness. There is no rebound and no guarding.  Musculoskeletal: He exhibits no edema or tenderness.  Neurological: He is alert  and oriented to person, place, and time.  Skin: Skin is warm and dry.  Psychiatric: He has a normal mood and affect. His behavior is normal.  Nursing note and vitals reviewed.   ED Course  Procedures (including critical care time) Labs Review Labs Reviewed  COMPREHENSIVE METABOLIC PANEL - Abnormal; Notable for the following:    Chloride 97 (*)    Creatinine, Ser 1.54 (*)    GFR calc non Af Amer 48 (*)    GFR calc Af Amer 55 (*)    All other components within normal limits  CBC WITH DIFFERENTIAL/PLATELET - Abnormal; Notable for the following:    RBC 3.93 (*)    Hemoglobin 12.6 (*)    HCT 36.2 (*)    All other components within normal limits  CULTURE, BLOOD (ROUTINE X 2)  CULTURE, BLOOD (ROUTINE X 2)  URINE CULTURE  URINALYSIS, ROUTINE W REFLEX MICROSCOPIC (NOT AT [facility])  INFLUENZA PANEL BY PCR (TYPE A & B, H1N1)  I-STAT CG4 LACTIC ACID, ED  I-STAT TROPOININ, ED  POCT I-STAT TROPONIN I    Imaging Review Dg Chest 2 View  09/12/2015  CLINICAL DATA:  Cough and fever with shortness of Breath EXAM: CHEST  2 VIEW COMPARISON:  07/17/2015 FINDINGS: Cardiac shadow is stable. The lungs are well aerated bilaterally. Very minimal left basilar atelectasis is noted. No focal confluent infiltrate or sizable effusion is noted. No bony abnormality is seen. IMPRESSION: Minimal left basilar atelectasis. Electronically Signed   By: Inez Catalina M.D.   On: 09/12/2015 07:26   Ct Abdomen Pelvis W Contrast  09/12/2015  CLINICAL DATA:  Fever, abdominal pain, cough and shortness of breath. Multiple myeloma. EXAM: CT ABDOMEN AND PELVIS WITH CONTRAST TECHNIQUE: Multidetector CT imaging of the abdomen and pelvis was performed using the standard protocol following bolus administration of intravenous contrast. CONTRAST:  39m OMNIPAQUE IOHEXOL 300 MG/ML SOLN, 1075mOMNIPAQUE IOHEXOL 300 MG/ML SOLN COMPARISON:  12/28/2014 FINDINGS: Circumferential wall thickening in the distal esophagus again noted, similar to  prior study. Stable paraesophageal lymph node. Heart is normal size. Dependent opacities in the lung bases, most likely atelectasis. No pleural effusions. Mild diffuse fatty infiltration of the liver. No focal abnormality. Gallbladder, spleen, pancreas, adrenals and kidneys are unremarkable. Appendix is visualized and is normal. Scattered colonic diverticula. No active diverticulitis. Stomach and small bowel are decompressed. No free fluid, free air or adenopathy. Urinary bladder is unremarkable. Aorta is normal caliber. Large lucent lesion within the T10 vertebral body, new since prior study. Smaller lucent areas in T11 and T12 and scattered throughout the lumbar spine, new since prior study, compatible with myeloma. Lucent areas noted within the proximal femurs bilaterally. IMPRESSION: No acute findings in the abdomen or pelvis. Scattered diverticula of the colon without active  diverticulitis. Dependent opacities in lung bases, likely atelectasis. Mild circumferential wall thickening of the distal esophagus, possibly esophagitis. Mild fatty infiltration of the liver. New lucent areas throughout the visualized thoracic and lumbar spine as well as proximal femurs compatible with given history of myeloma. These are new or worsened since prior study. Electronically Signed   By: Rolm Baptise M.D.   On: 09/12/2015 10:00   I have personally reviewed and evaluated these images and lab results as part of my medical decision-making.   EKG Interpretation None      MDM   Final diagnoses:  Fever  Fever     patient here for evaluation of fever, cough , abdominal pain. On initial evaluation patient with no significant abdominal tenderness, lung exam clear. Chest x-ray with some atelectasis but no evidence of pneumonia. Given his immunocompromise state in symptoms will treat for possible pneumonia empirically with antibiotics. On repeat evaluation patient with increased left-sided abdominal tenderness, CT scan  obtained that is negative for acute intra-abdominal pathology. Discussed with hospitalist regarding admission for further evaluation.    Quintella Reichert, MD 09/12/15 [phone]

## 2015-09-12 NOTE — ED Notes (Signed)
Upon entering room pt transporting to [facility]; will complete orders with pt return.

## 2015-09-12 NOTE — ED Notes (Signed)
Prior to hanging antibiotics adds complaint of generalized abdominal pain; reports this started at 0500 today as well; pt did not report complaint with initial assessment. See orders for intervention.

## 2015-09-13 ENCOUNTER — Inpatient Hospital Stay (HOSPITAL_COMMUNITY): Payer: No Typology Code available for payment source

## 2015-09-13 ENCOUNTER — Encounter (HOSPITAL_COMMUNITY): Payer: Self-pay

## 2015-09-13 DIAGNOSIS — I10 Essential (primary) hypertension: Secondary | ICD-10-CM | POA: Diagnosis not present

## 2015-09-13 DIAGNOSIS — R509 Fever, unspecified: Secondary | ICD-10-CM | POA: Diagnosis not present

## 2015-09-13 DIAGNOSIS — C9000 Multiple myeloma not having achieved remission: Secondary | ICD-10-CM | POA: Diagnosis not present

## 2015-09-13 DIAGNOSIS — G893 Neoplasm related pain (acute) (chronic): Secondary | ICD-10-CM | POA: Diagnosis not present

## 2015-09-13 LAB — COMPREHENSIVE METABOLIC PANEL
ALBUMIN: 3.4 g/dL — AB (ref 3.5–5.0)
ALK PHOS: 50 U/L (ref 38–126)
ALT: 36 U/L (ref 17–63)
AST: 31 U/L (ref 15–41)
Anion gap: 10 (ref 5–15)
BILIRUBIN TOTAL: 0.5 mg/dL (ref 0.3–1.2)
BUN: 18 mg/dL (ref 6–20)
CALCIUM: 8.9 mg/dL (ref 8.9–10.3)
CO2: 25 mmol/L (ref 22–32)
CREATININE: 1.57 mg/dL — AB (ref 0.61–1.24)
Chloride: 101 mmol/L (ref 101–111)
GFR calc non Af Amer: 47 mL/min — ABNORMAL LOW (ref >60)
GFR, EST AFRICAN AMERICAN: 54 mL/min — AB (ref >60)
GLUCOSE: 104 mg/dL — AB (ref 65–99)
Potassium: 3.6 mmol/L (ref 3.5–5.1)
SODIUM: 136 mmol/L (ref 135–145)
TOTAL PROTEIN: 6 g/dL — AB (ref 6.5–8.1)

## 2015-09-13 LAB — CBC
HEMATOCRIT: 33.2 % — AB (ref 39.0–52.0)
HEMOGLOBIN: 11.6 g/dL — AB (ref 13.0–17.0)
MCH: 33 pg (ref 26.0–34.0)
MCHC: 34.9 g/dL (ref 30.0–36.0)
MCV: 94.3 fL (ref 78.0–100.0)
Platelets: 125 10*3/uL — ABNORMAL LOW (ref 150–400)
RBC: 3.52 MIL/uL — AB (ref 4.22–5.81)
RDW: 13 % (ref 11.5–15.5)
WBC: 2.9 10*3/uL — ABNORMAL LOW (ref 4.0–10.5)

## 2015-09-13 LAB — URINE CULTURE

## 2015-09-13 MED ORDER — OXYCODONE HCL 5 MG PO TABS: 5.0000 mg | ORAL_TABLET | ORAL | Status: DC | PRN

## 2015-09-13 MED ORDER — DEXTROSE 5 % IV SOLN
2.0000 g | Freq: Two times a day (BID) | INTRAVENOUS | Status: DC
  Administered 2015-09-13 (×2): 2 g via INTRAVENOUS
  Filled 2015-09-13 (×3): qty 2

## 2015-09-13 MED ORDER — SENNOSIDES-DOCUSATE SODIUM 8.6-50 MG PO TABS
1.0000 | ORAL_TABLET | Freq: Two times a day (BID) | ORAL | Status: DC
  Administered 2015-09-13 – 2015-09-15 (×6): 1 via ORAL
  Filled 2015-09-13 (×7): qty 1

## 2015-09-13 MED ORDER — AMLODIPINE BESYLATE 10 MG PO TABS
10.0000 mg | ORAL_TABLET | Freq: Every day | ORAL | Status: DC
  Administered 2015-09-13: 10 mg via ORAL
  Filled 2015-09-13 (×2): qty 1

## 2015-09-13 MED ORDER — POLYETHYLENE GLYCOL 3350 17 G PO PACK
17.0000 g | PACK | Freq: Every day | ORAL | Status: DC
  Administered 2015-09-13 – 2015-09-14 (×2): 17 g via ORAL
  Filled 2015-09-13: qty 1

## 2015-09-13 MED ORDER — VANCOMYCIN HCL IN DEXTROSE 750-5 MG/150ML-% IV SOLN
750.0000 mg | Freq: Two times a day (BID) | INTRAVENOUS | Status: DC
  Administered 2015-09-13 – 2015-09-15 (×4): 750 mg via INTRAVENOUS
  Filled 2015-09-13 (×5): qty 150

## 2015-09-13 MED ORDER — METHOCARBAMOL 500 MG PO TABS
500.0000 mg | ORAL_TABLET | Freq: Four times a day (QID) | ORAL | Status: DC | PRN
  Administered 2015-09-13 (×2): 500 mg via ORAL
  Filled 2015-09-13 (×2): qty 1

## 2015-09-13 MED ORDER — VANCOMYCIN HCL 10 G IV SOLR
1500.0000 mg | Freq: Once | INTRAVENOUS | Status: AC
  Administered 2015-09-13: 1500 mg via INTRAVENOUS
  Filled 2015-09-13: qty 1500

## 2015-09-13 MED ORDER — HYDROMORPHONE HCL 2 MG/ML IJ SOLN
2.0000 mg | INTRAMUSCULAR | Status: DC | PRN
  Administered 2015-09-13 – 2015-09-15 (×6): 2 mg via INTRAVENOUS
  Filled 2015-09-13 (×6): qty 1

## 2015-09-13 MED ORDER — LENALIDOMIDE 10 MG PO CAPS
10.0000 mg | ORAL_CAPSULE | Freq: Every day | ORAL | Status: DC
  Administered 2015-09-13: 10 mg via ORAL

## 2015-09-13 NOTE — Progress Notes (Signed)
ANTIBIOTIC CONSULT NOTE - INITIAL  Pharmacy Consult for vancomycin and cefepime Indication: Febrile Neutropenia  Allergies  Allergen Reactions  . Latex Itching and Rash    Patient Measurements: Height: _0  (167.6 cm) Weight: 165 lb 9.1 oz (75.1 kg) IBW/kg (Calculated) : 63.8   Vital Signs: Temp: 100.6 F (38.1 C) (01/29 0426) Temp Source: Oral (01/29 0426) BP: 141/83 mmHg (01/29 0426) Pulse Rate: 99 (01/29 0426) Intake/Output from previous day: 01/28 0701 - 01/29 0700 In: 831.3 [P.O.:120; I.V.:411.3; IV Piggyback:300] Out: 1200 [Urine:1200] Intake/Output from this shift: Total I/O In: -  Out: 300 [Urine:300]  Labs:  Recent Labs  09/12/15 0738 09/13/15 0410  WBC 5.4 2.9*  HGB 12.6* 11.6*  PLT 157 125*  CREATININE 1.54* 1.57*   Estimated Creatinine Clearance: 45.7 mL/min (by C-G formula based on Cr of 1.57). No results for input(s): VANCOTROUGH, VANCOPEAK, VANCORANDOM, GENTTROUGH, GENTPEAK, GENTRANDOM, TOBRATROUGH, TOBRAPEAK, TOBRARND, AMIKACINPEAK, AMIKACINTROU, AMIKACIN in the last 72 hours.   Microbiology: Recent Results (from the past 720 hour(s))  Culture, blood (routine x 2)     Status: None (Preliminary result)   Collection Time: 09/12/15  7:30 AM  Result Value Ref Range Status   Specimen Description   Final    BLOOD RIGHT ANTECUBITAL Performed at [facility]    Special Requests BOTTLES DRAWN AEROBIC AND ANAEROBIC 5CC  Final   Culture PENDING  Incomplete   Report Status PENDING  Incomplete    Medical History: Past Medical History  Diagnosis Date  . Priapism 2014   . Hypertension Dx 2016  . Multiple myeloma not having achieved remission ([place]) 09/01/2015    Assessment: Patient is a 60 y.o M recently diagnosed with multiple myeloma on velcade and revlimid PTA who presented to the ED on 1/28 with c/o fever. Cefepime 2 gm and vancomycin 1gm given in the ED at 0900 and 1130, respectively.  Abx regimen was then changed to levaquin on  admission for suspected PNA.  Patient remains febrile.  To transition to back to cefepime and vancomycin for broad empiric coverage for suspected febrile neutropenia.  - Tmax 102.7, wbc low, scr 1.57 (crcl~45- 53); no cbc with dif yet - 1/28 CT abd pelvis: opacities in lung bases, likely atelectasis  1/28 vanc>> 1/28 cefepime>> 1/28 LVQ>> 1/29  1/28 ucx:  1/28 bcx x2:  Goal of Therapy:  Vancomycin trough level 15-20 mcg/ml  Plan:  - vancomycin 1500 mg IV x1 loading dose, then 750 mg IV q12h - cefepime 2gm IV q12h - f/u cbc with dif on 1/30  Brason Berthelot P 09/13/2015,9:25 AM

## 2015-09-13 NOTE — Progress Notes (Signed)
[facility] PROGRESS NOTE  Erik Nguyen NWG:[id number] DOB: 1956-02-18 DOA: 09/12/2015 PCP: Minerva Ends, MD  Assessment/Plan:  Fever/SIRS -source unclear, CXR with atelectasis only -CT abd and UA unremarkable, influenza PCR negative -still with fevers, will stop levaquin, start Broad spectrum Abx-Vanc/cefepime -FU Blood Cx -repeat CXR today   HTN (hypertension) -stable, held lisinopril, add amlodipine   Multiple myeloma  -on remicaide, s/p Velcade injection on 1/23 -resume remicaide   Cancer associated pain/bone lesions -continue Iv dilaudid, on tramadol PRN at home    Abd pain -could be due to underlying myeloma/recent chemo -CT negative, UA negative -trial of laxatives  DVT Prophylaxis:lovenox  Full Code Family Communication: wife at bedside Disposition Plan: home when stable   Consultants:  HPI/Subjective: Complains of lower abd pain, breathing ok, rare cough  Objective: Filed Vitals:   09/12/15 2015 09/13/15 0426  BP: 133/79 141/83  Pulse: 108 99  Temp: 100.3 F (37.9 C) 100.6 F (38.1 C)  Resp: 16 16    Intake/Output Summary (Last 24 hours) at 09/13/15 1054 Last data filed at 09/13/15 0719  Gross per 24 hour  Intake 831.25 ml  Output   1500 ml  Net -668.75 ml   Filed Weights   09/12/15 1220  Weight: 75.1 kg (165 lb 9.1 oz)    Exam:   General:  AAOx3, ill appearing  Cardiovascular: S1S2/RRR  Respiratory: CTAB  Abdomen: soft, NT, BS present  Musculoskeletal: no edema c/c   Data Reviewed: Basic Metabolic Panel:  Recent Labs Lab 09/07/15 0801 09/12/15 0738 09/13/15 0410  NA 140 135 136  K 3.9 4.0 3.6  CL  --  97* 101  CO2 _0 GLUCOSE 107 97 104*  BUN 15._1 CREATININE 1.2 1.54* 1.57*  CALCIUM 9.7 9.7 8.9   Liver Function Tests:  Recent Labs Lab 09/07/15 0801 09/12/15 0738 09/13/15 0410  AST 15 34 31  ALT 18 37 36  ALKPHOS 75 61 50  BILITOT 0.40 0.6 0.5  PROT 6.8 6.9 6.0*  ALBUMIN 3.7  4.2 3.4*   No results for input(s): LIPASE, AMYLASE in the last 168 hours. No results for input(s): AMMONIA in the last 168 hours. CBC:  Recent Labs Lab 09/07/15 0801 09/12/15 0738 09/13/15 0410  WBC 5.3 5.4 2.9*  NEUTROABS 3.7 3.7  --   HGB 12.8* 12.6* 11.6*  HCT 36.4* 36.2* 33.2*  MCV 90.9 92.1 94.3  PLT 230 157 125*   Cardiac Enzymes: No results for input(s): CKTOTAL, CKMB, CKMBINDEX, TROPONINI in the last 168 hours. BNP (last 3 results) No results for input(s): BNP in the last 8760 hours.  ProBNP (last 3 results) No results for input(s): PROBNP in the last 8760 hours.  CBG: No results for input(s): GLUCAP in the last 168 hours.  Recent Results (from the past 240 hour(s))  Culture, blood (routine x 2)     Status: None (Preliminary result)   Collection Time: 09/12/15  7:30 AM  Result Value Ref Range Status   Specimen Description   Final    BLOOD RIGHT ANTECUBITAL Performed at [place]  Final   Culture PENDING  Incomplete   Report Status PENDING  Incomplete     Studies: Dg Chest 2 View  09/13/2015  CLINICAL DATA:  Fever, cough today. EXAM: CHEST  2 VIEW COMPARISON:  09/12/2015 FINDINGS: Patchy opacity posteriorly on the lateral view could reflect atelectasis or infiltrate, likely in the  left lower lobe. Right lung is clear. Heart is normal size. No effusions or acute bony abnormality. IMPRESSION: Opacity noted posteriorly on the lateral view, likely in the left lower lobe which could reflect atelectasis or pneumonia Electronically Signed   By: Rolm Baptise M.D.   On: 09/13/2015 08:20   Dg Chest 2 View  09/12/2015  CLINICAL DATA:  Cough and fever with shortness of Breath EXAM: CHEST  2 VIEW COMPARISON:  07/17/2015 FINDINGS: Cardiac shadow is stable. The lungs are well aerated bilaterally. Very minimal left basilar atelectasis is noted. No focal confluent infiltrate or sizable effusion is noted. No  bony abnormality is seen. IMPRESSION: Minimal left basilar atelectasis. Electronically Signed   By: Inez Catalina M.D.   On: 09/12/2015 07:26   Ct Abdomen Pelvis W Contrast  09/12/2015  CLINICAL DATA:  Fever, abdominal pain, cough and shortness of breath. Multiple myeloma. EXAM: CT ABDOMEN AND PELVIS WITH CONTRAST TECHNIQUE: Multidetector CT imaging of the abdomen and pelvis was performed using the standard protocol following bolus administration of intravenous contrast. CONTRAST:  79m OMNIPAQUE IOHEXOL 300 MG/ML SOLN, 1031mOMNIPAQUE IOHEXOL 300 MG/ML SOLN COMPARISON:  12/28/2014 FINDINGS: Circumferential wall thickening in the distal esophagus again noted, similar to prior study. Stable paraesophageal lymph node. Heart is normal size. Dependent opacities in the lung bases, most likely atelectasis. No pleural effusions. Mild diffuse fatty infiltration of the liver. No focal abnormality. Gallbladder, spleen, pancreas, adrenals and kidneys are unremarkable. Appendix is visualized and is normal. Scattered colonic diverticula. No active diverticulitis. Stomach and small bowel are decompressed. No free fluid, free air or adenopathy. Urinary bladder is unremarkable. Aorta is normal caliber. Large lucent lesion within the T10 vertebral body, new since prior study. Smaller lucent areas in T11 and T12 and scattered throughout the lumbar spine, new since prior study, compatible with myeloma. Lucent areas noted within the proximal femurs bilaterally. IMPRESSION: No acute findings in the abdomen or pelvis. Scattered diverticula of the colon without active diverticulitis. Dependent opacities in lung bases, likely atelectasis. Mild circumferential wall thickening of the distal esophagus, possibly esophagitis. Mild fatty infiltration of the liver. New lucent areas throughout the visualized thoracic and lumbar spine as well as proximal femurs compatible with given history of myeloma. These are new or worsened since prior study.  Electronically Signed   By: KeRolm Baptise.D.   On: 09/12/2015 10:00    Scheduled Meds: . acyclovir  400 mg Oral BID  . amLODipine  10 mg Oral Daily  . aspirin EC  81 mg Oral Daily  . ceFEPime (MAXIPIME) IV  2 g Intravenous Q12H  . cholecalciferol  2,000 Units Oral Daily  . diclofenac sodium  2 g Topical QID  . enoxaparin (LOVENOX) injection  40 mg Subcutaneous Q24H  . lenalidomide  10 mg Oral Daily  . polyethylene glycol  17 g Oral Daily  . senna-docusate  1 tablet Oral BID  . vancomycin  1,500 mg Intravenous Once  . vancomycin  750 mg Intravenous Q12H   Continuous Infusions: . sodium chloride 75 mL/hr at 09/12/15 1131   Antibiotics Given (last 72 hours)    Date/Time Action Medication Dose Rate   09/12/15 1341 Given   levofloxacin (LEVAQUIN) IVPB 500 mg 500 mg 100 mL/hr   09/12/15 2216 Given   acyclovir (ZOVIRAX) tablet 400 mg 400 mg    09/13/15 1020 Given  [waiting for pharmacy to send]   ceFEPIme (MAXIPIME) 2 g in dextrose 5 % 50 mL IVPB 2 g 100 mL/hr  09/13/15 1021 Given   acyclovir (ZOVIRAX) tablet 400 mg 400 mg       Principal Problem:   Fever Active Problems:   HTN (hypertension)   Multiple myeloma not having achieved remission ([place])   Cancer associated pain    Time spent: 46mn    Albie Bazin  [facility] Pager [phone] If 7PM-7AM, please contact night-coverage at [email or web address], password TBerstein Hill[facility]1/29/2017, 10:54 AM  LOS: 1 day

## 2015-09-13 NOTE — Progress Notes (Signed)
Utilization review completed.  

## 2015-09-13 NOTE — Progress Notes (Signed)
Pt 's temp-101.7 @1550 . Dr Broadus John notified. Kpad removed for 20 minutes and temp rechecked,Temp-98.7. No Tylenol given.

## 2015-09-14 ENCOUNTER — Other Ambulatory Visit: Payer: No Typology Code available for payment source

## 2015-09-14 ENCOUNTER — Ambulatory Visit: Payer: No Typology Code available for payment source | Admitting: Hematology and Oncology

## 2015-09-14 ENCOUNTER — Ambulatory Visit: Payer: No Typology Code available for payment source

## 2015-09-14 DIAGNOSIS — M898X8 Other specified disorders of bone, other site: Secondary | ICD-10-CM

## 2015-09-14 DIAGNOSIS — K5909 Other constipation: Secondary | ICD-10-CM

## 2015-09-14 DIAGNOSIS — N179 Acute kidney failure, unspecified: Secondary | ICD-10-CM | POA: Diagnosis not present

## 2015-09-14 DIAGNOSIS — C9000 Multiple myeloma not having achieved remission: Secondary | ICD-10-CM | POA: Diagnosis not present

## 2015-09-14 DIAGNOSIS — G893 Neoplasm related pain (acute) (chronic): Secondary | ICD-10-CM | POA: Diagnosis not present

## 2015-09-14 DIAGNOSIS — K5900 Constipation, unspecified: Secondary | ICD-10-CM

## 2015-09-14 DIAGNOSIS — J9811 Atelectasis: Secondary | ICD-10-CM | POA: Diagnosis not present

## 2015-09-14 DIAGNOSIS — D61810 Antineoplastic chemotherapy induced pancytopenia: Secondary | ICD-10-CM

## 2015-09-14 DIAGNOSIS — D61818 Other pancytopenia: Secondary | ICD-10-CM | POA: Diagnosis not present

## 2015-09-14 DIAGNOSIS — R509 Fever, unspecified: Secondary | ICD-10-CM | POA: Diagnosis not present

## 2015-09-14 DIAGNOSIS — Z87898 Personal history of other specified conditions: Secondary | ICD-10-CM

## 2015-09-14 DIAGNOSIS — T451X5A Adverse effect of antineoplastic and immunosuppressive drugs, initial encounter: Secondary | ICD-10-CM

## 2015-09-14 DIAGNOSIS — K209 Esophagitis, unspecified: Secondary | ICD-10-CM

## 2015-09-14 DIAGNOSIS — D701 Agranulocytosis secondary to cancer chemotherapy: Secondary | ICD-10-CM | POA: Diagnosis not present

## 2015-09-14 DIAGNOSIS — M898X9 Other specified disorders of bone, unspecified site: Secondary | ICD-10-CM | POA: Diagnosis present

## 2015-09-14 LAB — COMPREHENSIVE METABOLIC PANEL
ALT: 37 U/L (ref 17–63)
AST: 33 U/L (ref 15–41)
Albumin: 3.5 g/dL (ref 3.5–5.0)
Alkaline Phosphatase: 47 U/L (ref 38–126)
Anion gap: 8 (ref 5–15)
BUN: 11 mg/dL (ref 6–20)
CO2: 27 mmol/L (ref 22–32)
Calcium: 8.9 mg/dL (ref 8.9–10.3)
Chloride: 106 mmol/L (ref 101–111)
Creatinine, Ser: 1.18 mg/dL (ref 0.61–1.24)
GFR calc non Af Amer: >60 mL/min (ref >60)
Glucose, Bld: 108 mg/dL — ABNORMAL HIGH (ref 65–99)
Potassium: 3.7 mmol/L (ref 3.5–5.1)
SODIUM: 141 mmol/L (ref 135–145)
Total Bilirubin: 0.4 mg/dL (ref 0.3–1.2)
Total Protein: 6.1 g/dL — ABNORMAL LOW (ref 6.5–8.1)

## 2015-09-14 LAB — CBC WITH DIFFERENTIAL/PLATELET
BASOS PCT: 1 %
Basophils Absolute: 0 10*3/uL (ref 0.0–0.1)
EOS ABS: 0 10*3/uL (ref 0.0–0.7)
Eosinophils Relative: 1 %
HCT: 35.3 % — ABNORMAL LOW (ref 39.0–52.0)
HEMOGLOBIN: 12 g/dL — AB (ref 13.0–17.0)
Lymphocytes Relative: 46 %
Lymphs Abs: 0.9 10*3/uL (ref 0.7–4.0)
MCH: 31.9 pg (ref 26.0–34.0)
MCHC: 34 g/dL (ref 30.0–36.0)
MCV: 93.9 fL (ref 78.0–100.0)
Monocytes Absolute: 0.1 10*3/uL (ref 0.1–1.0)
Monocytes Relative: 5 %
Neutro Abs: 0.9 10*3/uL — ABNORMAL LOW (ref 1.7–7.7)
Neutrophils Relative %: 47 %
Platelets: 95 10*3/uL — ABNORMAL LOW (ref 150–400)
RBC: 3.76 MIL/uL — ABNORMAL LOW (ref 4.22–5.81)
RDW: 13.2 % (ref 11.5–15.5)
WBC: 1.9 10*3/uL — ABNORMAL LOW (ref 4.0–10.5)

## 2015-09-14 MED ORDER — POLYETHYLENE GLYCOL 3350 17 G PO PACK
17.0000 g | PACK | Freq: Two times a day (BID) | ORAL | Status: DC
  Administered 2015-09-14 – 2015-09-15 (×3): 17 g via ORAL
  Filled 2015-09-14 (×5): qty 1

## 2015-09-14 MED ORDER — SODIUM CHLORIDE 0.9 % IV BOLUS (SEPSIS): 1000.0000 mL | Freq: Once | INTRAVENOUS | Status: DC

## 2015-09-14 MED ORDER — PANTOPRAZOLE SODIUM 40 MG PO TBEC
40.0000 mg | DELAYED_RELEASE_TABLET | Freq: Every day | ORAL | Status: DC
  Administered 2015-09-14 – 2015-09-16 (×3): 40 mg via ORAL
  Filled 2015-09-14 (×3): qty 1

## 2015-09-14 MED ORDER — TBO-FILGRASTIM 300 MCG/0.5ML ~~LOC~~ SOSY
300.0000 ug | PREFILLED_SYRINGE | Freq: Every day | SUBCUTANEOUS | Status: DC
Start: 2015-09-14 — End: 2015-09-15
  Administered 2015-09-14: 300 ug via SUBCUTANEOUS
  Filled 2015-09-14 (×2): qty 0.5

## 2015-09-14 MED ORDER — LORATADINE 10 MG PO TABS
10.0000 mg | ORAL_TABLET | Freq: Every day | ORAL | Status: AC
  Administered 2015-09-14 – 2015-09-15 (×2): 10 mg via ORAL
  Filled 2015-09-14 (×2): qty 1

## 2015-09-14 MED ORDER — NAPROXEN 500 MG PO TABS
500.0000 mg | ORAL_TABLET | Freq: Two times a day (BID) | ORAL | Status: DC
  Administered 2015-09-14 – 2015-09-15 (×2): 500 mg via ORAL
  Filled 2015-09-14 (×4): qty 1

## 2015-09-14 MED ORDER — CEFEPIME HCL 2 G IJ SOLR
2.0000 g | Freq: Three times a day (TID) | INTRAMUSCULAR | Status: DC
  Administered 2015-09-14 – 2015-09-16 (×7): 2 g via INTRAVENOUS
  Filled 2015-09-14 (×7): qty 2

## 2015-09-14 NOTE — Progress Notes (Signed)
ANTIBIOTIC CONSULT NOTE - Follow Up  Pharmacy Consult for vancomycin and cefepime Indication: Febrile Neutropenia  Allergies  Allergen Reactions  . Latex Itching and Rash    Patient Measurements: Height: 5' 6"  (167.6 cm) Weight: 165 lb 9.1 oz (75.1 kg) IBW/kg (Calculated) : 63.8   Vital Signs: Temp: 98.5 F (36.9 C) (01/30 0515) Temp Source: Oral (01/30 0515) BP: 96/64 mmHg (01/30 0515) Pulse Rate: 87 (01/30 0515) Intake/Output from previous day: 01/29 0701 - 01/30 0700 In: 2980 [P.O.:1580; I.V.:1350; IV Piggyback:50] Out: 7628 [Urine:1820] Intake/Output from this shift:    Labs:  Recent Labs  09/12/15 0738 09/13/15 0410 09/14/15 0440  WBC 5.4 2.9* 1.9*  HGB 12.6* 11.6* 12.0*  PLT 157 125* 95*  CREATININE 1.54* 1.57* 1.18   Estimated Creatinine Clearance: 60.8 mL/min (by C-G formula based on Cr of 1.18). No results for input(s): VANCOTROUGH, VANCOPEAK, VANCORANDOM, GENTTROUGH, GENTPEAK, GENTRANDOM, TOBRATROUGH, TOBRAPEAK, TOBRARND, AMIKACINPEAK, AMIKACINTROU, AMIKACIN in the last 72 hours.   Microbiology: Recent Results (from the past 720 hour(s))  Culture, blood (routine x 2)     Status: None (Preliminary result)   Collection Time: 09/12/15  7:30 AM  Result Value Ref Range Status   Specimen Description BLOOD RIGHT ANTECUBITAL  Final   Special Requests BOTTLES DRAWN AEROBIC AND ANAEROBIC 5CC  Final   Culture   Final    NO GROWTH 1 DAY Performed at [facility]    Report Status PENDING  Incomplete  Culture, blood (routine x 2)     Status: None (Preliminary result)   Collection Time: 09/12/15  7:43 AM  Result Value Ref Range Status   Specimen Description BLOOD RIGHT HAND  Final   Special Requests BOTTLES DRAWN AEROBIC ONLY 4CC  Final   Culture   Final    NO GROWTH 1 DAY Performed at [facility]    Report Status PENDING  Incomplete  Urine culture     Status: None   Collection Time: 09/12/15  7:55 AM  Result Value Ref Range Status   Specimen Description URINE, CLEAN CATCH  Final   Special Requests NONE  Final   Culture   Final    MULTIPLE SPECIES PRESENT, SUGGEST RECOLLECTION Performed at [facility]    Report Status 09/13/2015 FINAL  Final    Medical History: Past Medical History  Diagnosis Date  . Priapism 2014   . Hypertension Dx 2016  . Multiple myeloma not having achieved remission ([place]) 09/01/2015    Assessment: Patient is a 60 y.o M recently diagnosed with multiple myeloma on velcade last dose 1/26 and revlimid PTA who presented to the ED on 1/28 with c/o fever. Cefepime 2 gm and vancomycin 1gm given in the ED at 0900 and 1130, respectively.  Abx regimen was then changed to levaquin on admission for suspected PNA.  Patient remains febrile.  To transition to back to cefepime and vancomycin for broad empiric coverage for suspected febrile neutropenia.  - Tmax 101.7, wbc and ANC dropping, scr improved (crcl 61) - 1/28 CT abd pelvis: opacities in lung bases, likely atelectasis  1/28 vanc>> 1/28 cefepime>> 1/28 LVQ>> 1/29  1/28 ucx: multiple, suggest recollect 1/28 bcx x2: ngtd 1/28 flu: negative  Goal of Therapy:  Vancomycin trough level 15-20 mcg/ml  Plan:  1) Continue vancomycin 766m IV q12 2) Change cefepime from 2g IV q12 to 2g IV q8 for improved SCr   JAdrian Saran PharmD, BCPS Pager [phone] 27811/30/2017 8:05 AM

## 2015-09-14 NOTE — Care Management Note (Signed)
Case Management Note  Patient Details  Name: Erik Nguyen MRN: [id number] Date of Birth: 1956/07/10  Subjective/Objective:          60 yo admitted with fever. Hx of Multiple Myeloma          Action/Plan: From home with spouse  Expected Discharge Date:                  Expected Discharge Plan:  Home/Self Care  In-House Referral:     Discharge planning Services  CM Consult  Post Acute Care Choice:    Choice offered to:     DME Arranged:    DME Agency:     HH Arranged:    HH Agency:     Status of Service:  In process, will continue to follow  Medicare Important Message Given:    Date Medicare IM Given:    Medicare IM give by:    Date Additional Medicare IM Given:    Additional Medicare Important Message give by:     If discussed at [place] of Stay Meetings, dates discussed:    Additional Comments: CSW received consult for help with receiving home chemo medication.  Per pharmacy note from [facility] on 09/11/15 "Patient assistance was obtained for patient and he received free 1 month temporary supply while his financial assistance for the revlimid is finalized."  Pt would need to follow up at the [facility] for this assistance. CM will continue to follow along pt while in hospital for other CM needs.     Lynnell Catalan, RN 09/14/2015, 10:09 AM

## 2015-09-14 NOTE — Progress Notes (Signed)
CSW received referral that pt having difficulty obtaining home chemo medications per wife.  Inappropriate CSW referral as CSW does not assist with medications.   Notified RNCM.   CSW signing off.  Please re-consult if social work needs arise.   Alison Murray, MSW, [place] Work [phone]

## 2015-09-14 NOTE — Progress Notes (Signed)
[facility] PROGRESS NOTE  Erik Nguyen JSR:[id number] DOB: Dec 05, 1955 DOA: 09/12/2015 PCP: Minerva Ends, MD  Assessment/Plan: #1 fever Questionable etiology. Urinalysis negative. Blood cultures pending. Repeat chest x-ray from 09/13/2015 with opacity noted posteriorly in the lateral view likely in the left lower lobe which could reflect atelectasis or pneumonia. Patient currently afebrile since broadening of antibiotics. Continue empiric IV vancomycin and IV cefepime while cultures are pending. Follow.  #2 hypertension Blood pressure is borderline with systolic blood pressures in the 90s. Patient complaining of dizziness. Check orthostasis. Discontinue Norvasc. Increase IV fluids to 1 25 mL per hour. Given fluid bolus. Follow.  #3 Multiple Myeloma Treatment hold per oncology.  #4 Pancytopenia Secondary to recent chemotherapy. Patient started on G-CSF per oncology. Patient with no bleeding. Discontinue Lovenox. Follow.  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 Patient hasn't placed on Naprosyn twice daily per oncology.  #6 esophagitis Per CT scan. Agree with PPI.  #7 chronic constipation Continue current bowel regimen. Change MiraLAX to twice daily. Will give a soapsuds enema.  #8 prophylaxis PPI for GI prophylaxis. SCDs for DVT prophylaxis.   Code Status: Full Family Communication: Updated patient. No family present. Disposition Plan: Home when afebrile and improvement with his counts.   Consultants:  Oncology: Dr. Alvy Bimler 09/14/2015  Procedures:  CT abdomen and pelvis 09/12/2015  Chest x-ray 09/04/2015, 09/13/2015  Antibiotics:  IV cefepime 09/12/2015  IV vancomycin 09/12/2015  IV Levaquin 09/12/2015>>>> 09/13/2015  HPI/Subjective: Patient states feeling nauseas. Patient feels constipated. Patient c/o pain. Patient denies any bleeding. Patient denies any cough. Patient complaining of  feeling dizzy.   Objective: Filed Vitals:   09/13/15 2138 09/14/15 0515  BP: 94/60 96/64  Pulse: 99 87  Temp: 99.8 F (37.7 C) 98.5 F (36.9 C)  Resp: 16 16    Intake/Output Summary (Last 24 hours) at 09/14/15 1028 Last data filed at 09/14/15 1000  Gross per 24 hour  Intake   1830 ml  Output   1305 ml  Net    525 ml   Filed Weights   09/12/15 1220  Weight: 75.1 kg (165 lb 9.1 oz)    Exam:   General:  NAD  Cardiovascular: RRR  Respiratory: CTAB  Abdomen: Soft/NT/mildly distended/+BS  Musculoskeletal: No c/c/e  Data Reviewed: Basic Metabolic Panel:  Recent Labs Lab 09/12/15 0738 09/13/15 0410 09/14/15 0440  NA 135 136 141  K 4.0 3.6 3.7  CL 97* 101 106  CO2 27 25 27   GLUCOSE 97 104* 108*  BUN 17 18 11   CREATININE 1.54* 1.57* 1.18  CALCIUM 9.7 8.9 8.9   Liver Function Tests:  Recent Labs Lab 09/12/15 0738 09/13/15 0410 09/14/15 0440  AST 34 31 33  ALT 37 36 37  ALKPHOS 61 50 47  BILITOT 0.6 0.5 0.4  PROT 6.9 6.0* 6.1*  ALBUMIN 4.2 3.4* 3.5   No results for input(s): LIPASE, AMYLASE in the last 168 hours. No results for input(s): AMMONIA in the last 168 hours. CBC:  Recent Labs Lab 09/12/15 0738 09/13/15 0410 09/14/15 0440  WBC 5.4 2.9* 1.9*  NEUTROABS 3.7  --  0.9*  HGB 12.6* 11.6* 12.0*  HCT 36.2* 33.2* 35.3*  MCV 92.1 94.3 93.9  PLT 157 125* 95*   Cardiac Enzymes: No results for input(s): CKTOTAL, CKMB, CKMBINDEX, TROPONINI in the last 168 hours. BNP (last 3 results) No results for input(s): BNP in the last 8760 hours.  ProBNP (last 3 results)  No results for input(s): PROBNP in the last 8760 hours.  CBG: No results for input(s): GLUCAP in the last 168 hours.  Recent Results (from the past 240 hour(s))  Culture, blood (routine x 2)     Status: None (Preliminary result)   Collection Time: 09/12/15  7:30 AM  Result Value Ref Range Status   Specimen Description BLOOD RIGHT ANTECUBITAL  Final   Special Requests BOTTLES  DRAWN AEROBIC AND ANAEROBIC 5CC  Final   Culture   Final    NO GROWTH 1 DAY Performed at [facility]    Report Status PENDING  Incomplete  Culture, blood (routine x 2)     Status: None (Preliminary result)   Collection Time: 09/12/15  7:43 AM  Result Value Ref Range Status   Specimen Description BLOOD RIGHT HAND  Final   Special Requests BOTTLES DRAWN AEROBIC ONLY 4CC  Final   Culture   Final    NO GROWTH 1 DAY Performed at [facility]    Report Status PENDING  Incomplete  Urine culture     Status: None   Collection Time: 09/12/15  7:55 AM  Result Value Ref Range Status   Specimen Description URINE, CLEAN CATCH  Final   Special Requests NONE  Final   Culture   Final    MULTIPLE SPECIES PRESENT, SUGGEST RECOLLECTION Performed at [facility]    Report Status 09/13/2015 FINAL  Final     Studies: Dg Chest 2 View  09/13/2015  CLINICAL DATA:  Fever, cough today. EXAM: CHEST  2 VIEW COMPARISON:  09/12/2015 FINDINGS: Patchy opacity posteriorly on the lateral view could reflect atelectasis or infiltrate, likely in the left lower lobe. Right lung is clear. Heart is normal size. No effusions or acute bony abnormality. IMPRESSION: Opacity noted posteriorly on the lateral view, likely in the left lower lobe which could reflect atelectasis or pneumonia Electronically Signed   By: Rolm Baptise M.D.   On: 09/13/2015 08:20    Scheduled Meds: . acyclovir  400 mg Oral BID  . amLODipine  10 mg Oral Daily  . aspirin EC  81 mg Oral Daily  . ceFEPime (MAXIPIME) IV  2 g Intravenous Q8H  . cholecalciferol  2,000 Units Oral Daily  . diclofenac sodium  2 g Topical QID  . loratadine  10 mg Oral Daily  . naproxen  500 mg Oral BID WC  . pantoprazole  40 mg Oral Daily  . polyethylene glycol  17 g Oral BID  . senna-docusate  1 tablet Oral BID  . Tbo-filgastrim (GRANIX) SQ  300 mcg Subcutaneous q1800  . vancomycin  750 mg Intravenous Q12H   Continuous Infusions: . sodium  chloride 1,000 mL (09/13/15 1759)    Principal Problem:   Fever Active Problems:   HTN (hypertension)   Esophagitis   Multiple myeloma not having achieved remission (HCC)   Cancer associated pain   Antineoplastic chemotherapy induced pancytopenia ([place])   Bone pain   Constipation    Time spent: 35 mins    [facility] MD [facility] Pager [phone]. If 7PM-7AM, please contact night-coverage at [email or web address], password [facility] 09/14/2015, 10:28 AM  LOS: 2 days

## 2015-09-14 NOTE — Progress Notes (Signed)
PT demonstrated verbal and hands on understanding of Flutter device. 

## 2015-09-14 NOTE — Progress Notes (Signed)
Erik Nguyen   DOB:[birth year]   ZO#:[id number]    This patient is well-known to me. He had recent diagnosis of multiple myeloma and was just started on combination chemotherapy with Velcade and Revlimid on 09/07/2015. The patient was admitted to the hospital from 09/04/2015 for fever Subjective: The patient is miserable. He has diffuse bone pain. His fever has subsided. He is constipated for 2 days. Appetite is stable. Denies mucositis. No chest pain, shortness of breath or cough. He denies abdominal pain.  Objective:  Filed Vitals:   09/13/15 2138 09/14/15 0515  BP: 94/60 96/64  Pulse: 99 87  Temp: 99.8 F (37.7 C) 98.5 F (36.9 C)  Resp: 16 16     Intake/Output Summary (Last 24 hours) at 09/14/15 0816 Last data filed at 09/14/15 9811  Gross per 24 hour  Intake   2880 ml  Output   1520 ml  Net   1360 ml    GENERAL:alert, no distress and comfortable SKIN: skin color, texture, turgor are normal, no rashes or significant lesions EYES: normal, Conjunctiva are pink and non-injected, sclera clear OROPHARYNX:no exudate, no erythema and lips, buccal mucosa, and tongue normal  NECK: supple, thyroid normal size, non-tender, without nodularity LYMPH:  no palpable lymphadenopathy in the cervical, axillary or inguinal LUNGS: clear to auscultation and percussion with normal breathing effort HEART: regular rate & rhythm and no murmurs and no lower extremity edema ABDOMEN:abdomen soft, non-tender and normal bowel sounds Musculoskeletal:no cyanosis of digits and no clubbing  NEURO: alert & oriented x 3 with fluent speech, no focal motor/sensory deficits   Labs:  Lab Results  Component Value Date   WBC 1.9* 09/14/2015   HGB 12.0* 09/14/2015   HCT 35.3* 09/14/2015   MCV 93.9 09/14/2015   PLT 95* 09/14/2015   NEUTROABS 0.9* 09/14/2015    Lab Results  Component Value Date   NA 141 09/14/2015   K 3.7 09/14/2015   CL 106 09/14/2015   CO2 27 09/14/2015    Studies:  Dg Chest 2  View  09/13/2015  CLINICAL DATA:  Fever, cough today. EXAM: CHEST  2 VIEW COMPARISON:  09/12/2015 FINDINGS: Patchy opacity posteriorly on the lateral view could reflect atelectasis or infiltrate, likely in the left lower lobe. Right lung is clear. Heart is normal size. No effusions or acute bony abnormality. IMPRESSION: Opacity noted posteriorly on the lateral view, likely in the left lower lobe which could reflect atelectasis or pneumonia Electronically Signed   By: Rolm Baptise M.D.   On: 09/13/2015 08:20   Ct Abdomen Pelvis W Contrast  09/12/2015  CLINICAL DATA:  Fever, abdominal pain, cough and shortness of breath. Multiple myeloma. EXAM: CT ABDOMEN AND PELVIS WITH CONTRAST TECHNIQUE: Multidetector CT imaging of the abdomen and pelvis was performed using the standard protocol following bolus administration of intravenous contrast. CONTRAST:  25m OMNIPAQUE IOHEXOL 300 MG/ML SOLN, 1059mOMNIPAQUE IOHEXOL 300 MG/ML SOLN COMPARISON:  12/28/2014 FINDINGS: Circumferential wall thickening in the distal esophagus again noted, similar to prior study. Stable paraesophageal lymph node. Heart is normal size. Dependent opacities in the lung bases, most likely atelectasis. No pleural effusions. Mild diffuse fatty infiltration of the liver. No focal abnormality. Gallbladder, spleen, pancreas, adrenals and kidneys are unremarkable. Appendix is visualized and is normal. Scattered colonic diverticula. No active diverticulitis. Stomach and small bowel are decompressed. No free fluid, free air or adenopathy. Urinary bladder is unremarkable. Aorta is normal caliber. Large lucent lesion within the T10 vertebral body, new since prior study. Smaller lucent  areas in T11 and T12 and scattered throughout the lumbar spine, new since prior study, compatible with myeloma. Lucent areas noted within the proximal femurs bilaterally. IMPRESSION: No acute findings in the abdomen or pelvis. Scattered diverticula of the colon without active  diverticulitis. Dependent opacities in lung bases, likely atelectasis. Mild circumferential wall thickening of the distal esophagus, possibly esophagitis. Mild fatty infiltration of the liver. New lucent areas throughout the visualized thoracic and lumbar spine as well as proximal femurs compatible with given history of myeloma. These are new or worsened since prior study. Electronically Signed   By: Rolm Baptise M.D.   On: 09/12/2015 10:00    Assessment & Plan:  Recent diagnosis of multiple myeloma I will put he is treatment on hold. Revlimid can cause further pancytopenia. I will cancel his chemotherapy this week until he gets better  Pancytopenia due to treatment I will hold all treatment as above. With diagnosis of infection and recent fever, I recommend starting him on G-CSF daily until [place] is greater than 1.5 He is not symptomatic from anemia and denies bleeding from thrombocytopenia He does not require blood transfusion in those regard  Diffuse bone pain secondary to multiple myeloma History of narcotic abuse The patient is quite adamant that he does not want to use a lot of narcotic prescription due to history of narcotic abuse He understood that the bone pain is a temporary phenomenon until his disease is well treated With the start of G-CSF, bone pain is expected. I recommend a trial of Claritin and naproxen for 2 days while on G-CSF.  Esophagitis CT scan show evidence of esophagitis Since I plan to start him on naproxen, I will cover him with pantoprazole daily  Left lower lobe atelectasis versus pneumonia He is currently on broad-spectrum IV antibiotics I will defer to your pharmacy and primary service for antibiotic management As above, he start on G-CSF for drug-induced neutropenia I started him on incentive spirometry  DVT prophylaxis He is on Lovenox Hold treatment if platelet count is less than 50,000  Chronic constipation He is on daily laxatives I encourage  ambulation  CODE STATUS Full code  Discharge planning He is not ready to go home due to unresolved infection and severe neutropenia Estimated length of stay another 2-3 days until his blood count stabilized   Annette Liotta, MD 09/14/2015  8:16 AM

## 2015-09-15 ENCOUNTER — Other Ambulatory Visit: Payer: Self-pay | Admitting: Hematology and Oncology

## 2015-09-15 DIAGNOSIS — K5900 Constipation, unspecified: Secondary | ICD-10-CM | POA: Diagnosis not present

## 2015-09-15 DIAGNOSIS — D701 Agranulocytosis secondary to cancer chemotherapy: Secondary | ICD-10-CM

## 2015-09-15 DIAGNOSIS — R509 Fever, unspecified: Secondary | ICD-10-CM | POA: Diagnosis not present

## 2015-09-15 DIAGNOSIS — D61810 Antineoplastic chemotherapy induced pancytopenia: Secondary | ICD-10-CM | POA: Diagnosis not present

## 2015-09-15 DIAGNOSIS — C9000 Multiple myeloma not having achieved remission: Secondary | ICD-10-CM | POA: Diagnosis not present

## 2015-09-15 DIAGNOSIS — G893 Neoplasm related pain (acute) (chronic): Secondary | ICD-10-CM | POA: Diagnosis not present

## 2015-09-15 DIAGNOSIS — N179 Acute kidney failure, unspecified: Secondary | ICD-10-CM | POA: Insufficient documentation

## 2015-09-15 DIAGNOSIS — D61818 Other pancytopenia: Secondary | ICD-10-CM | POA: Diagnosis not present

## 2015-09-15 LAB — CBC WITH DIFFERENTIAL/PLATELET
BASOS ABS: 0 10*3/uL (ref 0.0–0.1)
BASOS PCT: 0 %
EOS ABS: 0.2 10*3/uL (ref 0.0–0.7)
EOS PCT: 2 %
HCT: 32.5 % — ABNORMAL LOW (ref 39.0–52.0)
HEMOGLOBIN: 11.1 g/dL — AB (ref 13.0–17.0)
Lymphocytes Relative: 12 %
Lymphs Abs: 1.1 10*3/uL (ref 0.7–4.0)
MCH: 32.2 pg (ref 26.0–34.0)
MCHC: 34.2 g/dL (ref 30.0–36.0)
MCV: 94.2 fL (ref 78.0–100.0)
Monocytes Absolute: 0.5 10*3/uL (ref 0.1–1.0)
Monocytes Relative: 5 %
NEUTROS PCT: 82 %
Neutro Abs: 7.8 10*3/uL — ABNORMAL HIGH (ref 1.7–7.7)
PLATELETS: 79 10*3/uL — AB (ref 150–400)
RBC: 3.45 MIL/uL — AB (ref 4.22–5.81)
RDW: 13 % (ref 11.5–15.5)
WBC: 9.5 10*3/uL (ref 4.0–10.5)

## 2015-09-15 LAB — BASIC METABOLIC PANEL
ANION GAP: 7 (ref 5–15)
BUN: 11 mg/dL (ref 6–20)
CALCIUM: 8.4 mg/dL — AB (ref 8.9–10.3)
CO2: 25 mmol/L (ref 22–32)
CREATININE: 0.97 mg/dL (ref 0.61–1.24)
Chloride: 111 mmol/L (ref 101–111)
GFR calc Af Amer: >60 mL/min (ref >60)
GLUCOSE: 97 mg/dL (ref 65–99)
Potassium: 3.8 mmol/L (ref 3.5–5.1)
Sodium: 143 mmol/L (ref 135–145)

## 2015-09-15 LAB — MAGNESIUM: Magnesium: 1.8 mg/dL (ref 1.7–2.4)

## 2015-09-15 NOTE — Progress Notes (Addendum)
[facility] PROGRESS NOTE  Erik Nguyen KZL:[id number] DOB: September 13, 1955 DOA: 09/12/2015 PCP: Minerva Ends, MD  Assessment/Plan: #1 fever Questionable etiology. Urinalysis negative. Blood cultures pending. Repeat chest x-ray from 09/13/2015 with opacity noted posteriorly in the lateral view likely in the left lower lobe which could reflect atelectasis or pneumonia. Patient currently afebrile since broadening of antibiotics. Will discontinue IV vancomycin. Continue IV cefepime. Cultures pending. Follow.  #2 hypertension Blood pressure is improving with hydration. Dizziness results. Patient is non-orthostatic. Decrease IV fluids to 75 mL per hour. Follow.  #3 Multiple Myeloma Treatment on hold per oncology. Outpatient follow-up.  #4 Pancytopenia Secondary to recent chemotherapy. Patient started on G-CSF per oncology. Patient with no bleeding. White count currently at 9.5 with ANC of 7.8. Hemoglobin stable at 11.1. Discontinue G-CSF. Discontinued Lovenox. Follow.  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 Patient has been placed on Naprosyn twice daily per oncology.  #6 esophagitis Per CT scan. Continue PPI.  #7 chronic constipation Patient with good results bowel movement after soapsuds enema. Continue current bowel regimen.   #8 acute kidney injury Likely secondary to prerenal azotemia secondary to volume depletion. Improved with hydration. Decrease IV fluids. Follow.  #9 prophylaxis PPI for GI prophylaxis. SCDs for DVT prophylaxis.   Code Status: Full Family Communication: Updated patient. No family present. Disposition Plan: Home when afebrile and improvement with his counts, hopefully in 1-2 days.   Consultants:  Oncology: Dr. Alvy Bimler 09/14/2015  Procedures:  CT abdomen and pelvis 09/12/2015  Chest x-ray 09/04/2015, 09/13/2015  Antibiotics:  IV cefepime 09/12/2015  IV vancomycin  09/12/2015>>>>.09/15/2015  IV Levaquin 09/12/2015>>>> 09/13/2015  HPI/Subjective: Patient denies dizziness. Good BM last night and this morning. No nausea, no emesis. Tolerating current diet. Feeling better.  Objective: Filed Vitals:   09/15/15 0911 09/15/15 0912  BP: 106/79 107/79  Pulse: 74 74  Temp: 98.1 F (36.7 C) 98.1 F (36.7 C)  Resp: 18 18    Intake/Output Summary (Last 24 hours) at 09/15/15 1449 Last data filed at 09/15/15 0600  Gross per 24 hour  Intake   3289 ml  Output    950 ml  Net   2339 ml   Filed Weights   09/12/15 1220  Weight: 75.1 kg (165 lb 9.1 oz)    Exam:   General:  NAD  Cardiovascular: RRR  Respiratory: CTAB  Abdomen: Soft/NT/ND/+BS  Musculoskeletal: No c/c/e  Data Reviewed: Basic Metabolic Panel:  Recent Labs Lab 09/12/15 0738 09/13/15 0410 09/14/15 0440 09/15/15 0420  NA 135 136 141 143  K 4.0 3.6 3.7 3.8  CL 97* 101 106 111  CO2 27 25 27 25   GLUCOSE 97 104* 108* 97  BUN 17 18 11 11   CREATININE 1.54* 1.57* 1.18 0.97  CALCIUM 9.7 8.9 8.9 8.4*  MG  --   --   --  1.8   Liver Function Tests:  Recent Labs Lab 09/12/15 0738 09/13/15 0410 09/14/15 0440  AST 34 31 33  ALT 37 36 37  ALKPHOS 61 50 47  BILITOT 0.6 0.5 0.4  PROT 6.9 6.0* 6.1*  ALBUMIN 4.2 3.4* 3.5   No results for input(s): LIPASE, AMYLASE in the last 168 hours. No results for input(s): AMMONIA in the last 168 hours. CBC:  Recent Labs Lab 09/12/15 0738 09/13/15 0410 09/14/15 0440 09/15/15 0420  WBC 5.4 2.9* 1.9* 9.5  NEUTROABS 3.7  --  0.9* 7.8*  HGB 12.6* 11.6* 12.0* 11.1*  HCT 36.2* 33.2* 35.3* 32.5*  MCV 92.1 94.3 93.9 94.2  PLT 157 125* 95* 79*   Cardiac Enzymes: No results for input(s): CKTOTAL, CKMB, CKMBINDEX, TROPONINI in the last 168 hours. BNP (last 3 results) No results for input(s): BNP in the last 8760 hours.  ProBNP (last 3 results) No results for input(s): PROBNP in the last 8760 hours.  CBG: No results for input(s):  GLUCAP in the last 168 hours.  Recent Results (from the past 240 hour(s))  Culture, blood (routine x 2)     Status: None (Preliminary result)   Collection Time: 09/12/15  7:30 AM  Result Value Ref Range Status   Specimen Description BLOOD RIGHT ANTECUBITAL  Final   Special Requests BOTTLES DRAWN AEROBIC AND ANAEROBIC 5CC  Final   Culture   Final    NO GROWTH 3 DAYS Performed at [facility]    Report Status PENDING  Incomplete  Culture, blood (routine x 2)     Status: None (Preliminary result)   Collection Time: 09/12/15  7:43 AM  Result Value Ref Range Status   Specimen Description BLOOD RIGHT HAND  Final   Special Requests BOTTLES DRAWN AEROBIC ONLY 4CC  Final   Culture   Final    NO GROWTH 3 DAYS Performed at [facility]    Report Status PENDING  Incomplete  Urine culture     Status: None   Collection Time: 09/12/15  7:55 AM  Result Value Ref Range Status   Specimen Description URINE, CLEAN CATCH  Final   Special Requests NONE  Final   Culture   Final    MULTIPLE SPECIES PRESENT, SUGGEST RECOLLECTION Performed at [facility]    Report Status 09/13/2015 FINAL  Final     Studies: No results found.  Scheduled Meds: . acyclovir  400 mg Oral BID  . aspirin EC  81 mg Oral Daily  . ceFEPime (MAXIPIME) IV  2 g Intravenous Q8H  . cholecalciferol  2,000 Units Oral Daily  . diclofenac sodium  2 g Topical QID  . pantoprazole  40 mg Oral Daily  . polyethylene glycol  17 g Oral BID  . senna-docusate  1 tablet Oral BID  . sodium chloride  1,000 mL Intravenous Once  . vancomycin  750 mg Intravenous Q12H   Continuous Infusions: . sodium chloride 125 mL (09/14/15 1027)    Principal Problem:   Fever Active Problems:   HTN (hypertension)   Esophagitis   Multiple myeloma not having achieved remission (HCC)   Cancer associated pain   Antineoplastic chemotherapy induced pancytopenia ([place])   Bone pain   Constipation    Time spent: 35  mins    [facility] MD [facility] Pager [phone]. If 7PM-7AM, please contact night-coverage at [email or web address], password [facility] 09/15/2015, 2:49 PM  LOS: 3 days

## 2015-09-15 NOTE — Progress Notes (Signed)
Erik Nguyen   DOB:May 31, 1956   SJ#:[id number]    Subjective:  He feels better. Denies further fevers or chills. Constipation has resolved. He denies any chest, cough, shortness of breath or chills.  He continues a persistent bone pain throughout but not worse since G-CSF injection.  Objective:  Filed Vitals:   09/15/15 0911 09/15/15 0912  BP: 106/79 107/79  Pulse: 74 74  Temp: 98.1 F (36.7 C) 98.1 F (36.7 C)  Resp: 18 18     Intake/Output Summary (Last 24 hours) at 09/15/15 1225 Last data filed at 09/15/15 0600  Gross per 24 hour  Intake   3289 ml  Output    950 ml  Net   2339 ml    GENERAL:alert, no distress and comfortable SKIN: skin color, texture, turgor are normal, no rashes or significant lesions EYES: normal, Conjunctiva are pink and non-injected, sclera clear HEART: regular rate & rhythm and no murmurs and no lower extremity edema ABDOMEN:abdomen soft, non-tender and normal bowel sounds Musculoskeletal:no cyanosis of digits and no clubbing  NEURO: alert & oriented x 3 with fluent speech, no focal motor/sensory deficits   Labs:  Lab Results  Component Value Date   WBC 9.5 09/15/2015   HGB 11.1* 09/15/2015   HCT 32.5* 09/15/2015   MCV 94.2 09/15/2015   PLT 79* 09/15/2015   NEUTROABS 7.8* 09/15/2015    Lab Results  Component Value Date   NA 143 09/15/2015   K 3.8 09/15/2015   CL 111 09/15/2015   CO2 25 09/15/2015    Assessment & Plan:   Recent diagnosis of multiple myeloma I will put his treatment on hold. Revlimid can cause further pancytopenia. I will cancel his chemotherapy this week until he gets better  Pancytopenia due to treatment I will hold all treatment as above. With diagnosis of infection and recent fever, I recommend starting him on G-CSF daily until [place] is greater than 1.5  he received 1 dose, 300 g of Granix on 09/14/2015 with resolution of neutropenia. No further G-CSF as needed He is not symptomatic from anemia and denies  bleeding from thrombocytopenia He does not require blood transfusion in those regard  Diffuse bone pain secondary to multiple myeloma History of narcotic abuse The patient is quite adamant that he does not want to use a lot of narcotic prescription due to history of narcotic abuse He understood that the bone pain is a temporary phenomenon until his disease is well treated   Esophagitis CT scan show evidence of esophagitis I will cover him with pantoprazole daily  Left lower lobe atelectasis versus pneumonia He is currently on broad-spectrum IV antibiotics I will defer to your pharmacy and primary service for antibiotic management  he responded well to G-CSF I started him on incentive spirometry  hopefully with culture negative, antibiotic therapy can be simplified  DVT prophylaxis He is on Lovenox Hold treatment if platelet count is less than 50,000  Chronic constipation He is on daily laxatives I encourage ambulation  CODE STATUS Full code  Discharge planning Overall, he is better. Hopefully he can be discharged by tomorrow. I have tentatively made an appointment to see him back in one week on 09/22/2015   [facility], Hue Steveson, MD 09/15/2015  12:25 PM

## 2015-09-15 NOTE — Clinical Documentation Improvement (Signed)
Hospitalist  (Query responses must be documented in the current medical record, not on the CDI BPA form.)  Possible Clinical Conditions:  - Acute Kidney Injury, including any associated condition(s), cause(s) and/or treatment(s)  - Other condition  - Unable to clinically determine  Clinical Information/Indicators: BUN/Cr/GFR trend this admission  (black male) Creat improved 0.36 mg/dL from 09/12/15 to 09/14/15 Creat improved 0.60 mg/dL from 08/2015 to 09/15/15 Component     Latest Ref Rng 09/12/2015 09/13/2015 09/14/2015 09/15/2015  BUN     6 - 20 mg/dL 17 18 11 11   Creatinine     0.61 - 1.24 mg/dL 1.54 (H) 1.57 (H) 1.18 0.97  EGFR (African American)     >60 mL/min 55 (L) 54 (L) >60 >60    Please exercise your independent, professional judgment when responding. A specific answer is not anticipated or expected.   Thank You, Erling Conte  RN BSN CCDS [phone] [employer]

## 2015-09-15 NOTE — Progress Notes (Addendum)
Pharmacy Antibiotic Note  Erik Nguyen is a 60 y.o. male admitted on 09/12/2015 with Febrile Neutropenia. Currently on day 4 vancomycin and cefepime.  Today, 09/15/2015: Temp: now afebrile  WBC: markedly increased, back to wnl; ANC now 7.8 << 0.9  Renal: SCr improved back to baseline; CrCl 74 CG  - 1/28 CT abd pelvis: opacities in lung bases, likely atelectasis   Plan:  Patient's antibiotics will be narrowed to cefepime alone; continue current dosing  If ANC remains improved tomorrow, would consider narrowing cefepime.   Height: 5\' 6"  (167.6 cm) Weight: 165 lb 9.1 oz (75.1 kg) IBW/kg (Calculated) : 63.8  Temp (24hrs), Avg:98 F (36.7 C), Min:97.6 F (36.4 C), Max:98.2 F (36.8 C)   Recent Labs Lab 09/12/15 0738 09/12/15 0746 09/13/15 0410 09/14/15 0440 09/15/15 0420  WBC 5.4  --  2.9* 1.9* 9.5  CREATININE 1.54*  --  1.57* 1.18 0.97  LATICACIDVEN  --  0.93  --   --   --     Estimated Creatinine Clearance: 74 mL/min (by C-G formula based on Cr of 0.97).    Allergies  Allergen Reactions  . Latex Itching and Rash    Antimicrobials this admission: 1/28 vanc>> 1/31 1/28 cefepime>>  1/28 LVQ >> 1/29   Dose adjustments this admission: 1/30: change cefepime from 2g q12 to 2g q8 for improved SCr  Microbiology results: 1/28 ucx: multiple species, suggest recollect  1/28 bcx x2: ngtd  1/28 flu panel: negative   Thank you for allowing pharmacy to be a part of this patient's care.  Reuel Boom, PharmD, BCPS Pager: [phone] 09/15/2015, 11:12 AM

## 2015-09-16 ENCOUNTER — Other Ambulatory Visit: Payer: Self-pay | Admitting: Hematology and Oncology

## 2015-09-16 DIAGNOSIS — M898X8 Other specified disorders of bone, other site: Secondary | ICD-10-CM | POA: Diagnosis not present

## 2015-09-16 DIAGNOSIS — D61818 Other pancytopenia: Secondary | ICD-10-CM | POA: Diagnosis not present

## 2015-09-16 DIAGNOSIS — G893 Neoplasm related pain (acute) (chronic): Secondary | ICD-10-CM | POA: Diagnosis not present

## 2015-09-16 DIAGNOSIS — D61810 Antineoplastic chemotherapy induced pancytopenia: Secondary | ICD-10-CM | POA: Diagnosis not present

## 2015-09-16 DIAGNOSIS — N179 Acute kidney failure, unspecified: Secondary | ICD-10-CM | POA: Diagnosis not present

## 2015-09-16 DIAGNOSIS — J9811 Atelectasis: Secondary | ICD-10-CM | POA: Diagnosis not present

## 2015-09-16 DIAGNOSIS — R509 Fever, unspecified: Secondary | ICD-10-CM | POA: Diagnosis not present

## 2015-09-16 DIAGNOSIS — C9000 Multiple myeloma not having achieved remission: Secondary | ICD-10-CM | POA: Diagnosis not present

## 2015-09-16 LAB — CBC WITH DIFFERENTIAL/PLATELET
BASOS ABS: 0 10*3/uL (ref 0.0–0.1)
BASOS PCT: 0 %
EOS ABS: 0.3 10*3/uL (ref 0.0–0.7)
EOS PCT: 2 %
HCT: 34 % — ABNORMAL LOW (ref 39.0–52.0)
Hemoglobin: 11.6 g/dL — ABNORMAL LOW (ref 13.0–17.0)
Lymphocytes Relative: 10 %
Lymphs Abs: 1.8 10*3/uL (ref 0.7–4.0)
MCH: 32.5 pg (ref 26.0–34.0)
MCHC: 34.1 g/dL (ref 30.0–36.0)
MCV: 95.2 fL (ref 78.0–100.0)
Monocytes Absolute: 0.8 10*3/uL (ref 0.1–1.0)
Monocytes Relative: 5 %
Neutro Abs: 14.1 10*3/uL — ABNORMAL HIGH (ref 1.7–7.7)
Neutrophils Relative %: 83 %
PLATELETS: 59 10*3/uL — AB (ref 150–400)
RBC: 3.57 MIL/uL — AB (ref 4.22–5.81)
RDW: 13.2 % (ref 11.5–15.5)
WBC: 17 10*3/uL — AB (ref 4.0–10.5)

## 2015-09-16 LAB — BASIC METABOLIC PANEL
ANION GAP: 7 (ref 5–15)
BUN: 13 mg/dL (ref 6–20)
CO2: 23 mmol/L (ref 22–32)
Calcium: 8.2 mg/dL — ABNORMAL LOW (ref 8.9–10.3)
Chloride: 112 mmol/L — ABNORMAL HIGH (ref 101–111)
Creatinine, Ser: 1.13 mg/dL (ref 0.61–1.24)
GFR calc Af Amer: >60 mL/min (ref >60)
Glucose, Bld: 105 mg/dL — ABNORMAL HIGH (ref 65–99)
POTASSIUM: 4.1 mmol/L (ref 3.5–5.1)
SODIUM: 142 mmol/L (ref 135–145)

## 2015-09-16 MED ORDER — AMOXICILLIN-POT CLAVULANATE 875-125 MG PO TABS: 1.0000 | ORAL_TABLET | Freq: Two times a day (BID) | ORAL | Status: DC

## 2015-09-16 MED ORDER — PANTOPRAZOLE SODIUM 40 MG PO TBEC: 40.0000 mg | DELAYED_RELEASE_TABLET | Freq: Every day | ORAL | Status: DC

## 2015-09-16 MED ORDER — TRAMADOL HCL 50 MG PO TABS: 50.0000 mg | ORAL_TABLET | Freq: Four times a day (QID) | ORAL | Status: DC | PRN

## 2015-09-16 MED ORDER — POLYETHYLENE GLYCOL 3350 17 G PO PACK: 17.0000 g | PACK | Freq: Two times a day (BID) | ORAL | Status: DC

## 2015-09-16 MED ORDER — SENNOSIDES-DOCUSATE SODIUM 8.6-50 MG PO TABS: 1.0000 | ORAL_TABLET | Freq: Two times a day (BID) | ORAL | Status: DC

## 2015-09-16 MED ORDER — AMLODIPINE BESYLATE 5 MG PO TABS: 5.0000 mg | ORAL_TABLET | Freq: Every day | ORAL | Status: DC

## 2015-09-16 NOTE — Progress Notes (Signed)
Pt discharged home in stable condition. Discharge instructions and scripts given. Pt verbalized understanding 

## 2015-09-16 NOTE — Discharge Summary (Signed)
Physician Discharge Summary  Erik Nguyen HWE:[id number] DOB: September 21, 1955 DOA: 09/12/2015  PCP: Minerva Ends, MD  Admit date: 09/12/2015 Discharge date: 09/16/2015  Time spent: 65 minutes  Recommendations for Outpatient Follow-up:  1. Patient be discharged home. Patient is to follow-up with Dr.Gorsuch, hematology/oncology on 09/22/2015. All follow-up patient will need a basic metabolic profile done to follow-up on electrolytes and renal function. Patient need a CBC done to follow-up on pancytopenia. 2. Patient is to follow-up with Minerva Ends, MD in 1-2 weeks. On follow-up patient will need a basic metabolic profile done to follow-up on electrolytes and renal function. Patient blood pressure need to be reassessed as his antihypertensive medications were changed to Norvasc. 3.    Discharge Diagnoses:  Principal Problem:   Fever Active Problems:   HTN (hypertension)   Esophagitis   Multiple myeloma not having achieved remission (HCC)   Cancer associated pain   Antineoplastic chemotherapy induced pancytopenia (HCC)   Bone pain   Constipation   AKI (acute kidney injury) ([place])   Discharge Condition: Stable and improved  Diet recommendation: Regular  Filed Weights   09/12/15 1220  Weight: 75.1 kg (165 lb 9.1 oz)    History of present illness:  Per Dr Lorane Gell Bulman is a 60 y.o. male with PMH of HTN, was recently diagnosed with multiple myeloma, he was started on Remicaide and received his first Velcade injection on 1/23. He presented to the ER with fevers. Pt reported being in his usual state of health, till 1 day prior to admission, he's had a dry cough for a few days, developed a fever the night prior to admission, and then again on the day of admission, associated with chills. Also reported lower abd pain, no N/V or diarrhea. In ER, temp of 102.4,CXR with atelectasis, CT abd without acute findings, [facility] consulted  Hospital Course:  #1 fever Patient was  admitted with a fever and chills. CT abdomen and pelvis which was done was negative for any acute abnormalities. Initial chest x-ray done was negative for any infiltrates. Urinalysis was negative. Blood cultures were drawn with no growth to date. Repeat chest x-ray from 09/13/2015 with opacity noted posteriorly in the lateral view likely in the left lower lobe which could reflect atelectasis or pneumonia. Patient was initially placed on IV cefepime and IV vancomycin due to ongoing fevers. Patient's fever defervesced. IV vancomycin was subsequently discontinued. Patient be discharged home on oral Augmentin for 4 more days to complete a course of antibiotic therapy. Outpatient follow-up.   #2 hypertension During the hospitalization patient was noted to have a borderline hypotension. Patient's antihypertensive medications were held. It was felt patient's borderline hypotension was secondary to dehydration. Patient was hydrated with IV fluids with resolution of borderline hypotension. Patient's antihypertensive medications of ACE inhibitor and diuretic were discontinued and patient be discharged home on Norvasc. Patient is to follow-up with PCP as outpatient.   #3 Multiple Myeloma Treatment on hold per oncology. Outpatient follow-up.  #4 Pancytopenia Secondary to recent chemotherapy. Patient started on G-CSF per oncology and received 1 dose with good results. Patient with no bleeding. White count currently at 17.0 with ANC of 14.1. Hemoglobin stable at 11.6. Discontinued G-CSF. Discontinued Lovenox. Patient will follow-up with oncology as outpatient and will need repeat labs done at that time.  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 Patient has been placed on Naprosyn twice daily per oncology 1 day. Patient will be  discharged home on ultram as needed..  #6 esophagitis Per CT scan. Patient was placed on the proton pump inhibitor.   #7  chronic constipation Patient with good results bowel movement after soapsuds enema. Patient was placed on a bowel regimen which she will be discharged home on.  #8 acute kidney injury Likely secondary to prerenal azotemia secondary to volume depletion in the setting of ACE inhibitor and diuretic. ACE inhibitor and diuretic were discontinued. Patient was hydrated with IV fluids. Patient's renal function improved and acute kidney injury had resolved by day of discharge.   Procedures:  CT abdomen and pelvis 09/12/2015  Chest x-ray 09/04/2015, 09/13/2015  Consultations:  Oncology: Dr. Alvy Bimler 09/14/2015  Discharge Exam: Filed Vitals:   09/15/15 2110 09/16/15 0528  BP: 121/79 138/90  Pulse: 80 67  Temp: 98 F (36.7 C) 98.4 F (36.9 C)  Resp: 16 16    General: NAD Cardiovascular: RRR Respiratory: CTAB  Discharge Instructions   Discharge Instructions    Diet general    Complete by:  As directed      Discharge instructions    Complete by:  As directed   Follow up with Dr Alvy Bimler as scheduled. Follow up with Minerva Ends, MD in 1-2 weeks.     Increase activity slowly    Complete by:  As directed           Current Discharge Medication List    START taking these medications   Details  amLODipine (NORVASC) 5 MG tablet Take 1 tablet (5 mg total) by mouth daily. Qty: 30 tablet, Refills: 0    amoxicillin-clavulanate (AUGMENTIN) 875-125 MG tablet Take 1 tablet by mouth 2 (two) times daily. Take for 4 days then stop. Qty: 8 tablet, Refills: 0    pantoprazole (PROTONIX) 40 MG tablet Take 1 tablet (40 mg total) by mouth daily. Qty: 30 tablet, Refills: 0   Associated Diagnoses: Esophagitis    polyethylene glycol (MIRALAX / GLYCOLAX) packet Take 17 g by mouth 2 (two) times daily. Qty: 14 each, Refills: 0    senna-docusate (SENOKOT-S) 8.6-50 MG tablet Take 1 tablet by mouth 2 (two) times daily.      CONTINUE these medications which have CHANGED   Details  traMADol  (ULTRAM) 50 MG tablet Take 1-2 tablets (50-100 mg total) by mouth every 6 (six) hours as needed. Qty: 30 tablet, Refills: 0      CONTINUE these medications which have NOT CHANGED   Details  acyclovir (ZOVIRAX) 400 MG tablet Take 1 tablet (400 mg total) by mouth 2 (two) times daily. Qty: 60 tablet, Refills: 3   Associated Diagnoses: Bence-Jones proteinuria; Multiple myeloma not having achieved remission (HCC)    aspirin EC 81 MG tablet Take 81 mg by mouth daily.    cholecalciferol (VITAMIN D) 1000 units tablet Take 2,000 Units by mouth daily.    dexamethasone (DECADRON) 4 MG tablet Take 5 tablets (20 mg) weekly on Mondays and Thursdays Qty: 60 tablet, Refills: 3   Associated Diagnoses: Bence-Jones proteinuria; Multiple myeloma not having achieved remission (HCC)    diclofenac sodium (VOLTAREN) 1 % GEL Apply 2 g topically 4 (four) times daily. Qty: 100 g, Refills: 2   Associated Diagnoses: Closed right clavicular fracture, with routine healing, subsequent encounter    meloxicam (MOBIC) 15 MG tablet Take 1 tablet (15 mg total) by mouth daily. Qty: 30 tablet, Refills: 0   Associated Diagnoses: Closed right clavicular fracture, with routine healing, subsequent encounter    ondansetron (ZOFRAN) 8  MG tablet Take 1 tablet (8 mg total) by mouth every 8 (eight) hours as needed (Nausea or vomiting). Qty: 30 tablet, Refills: 1   Associated Diagnoses: Bence-Jones proteinuria; Multiple myeloma not having achieved remission (HCC)    prochlorperazine (COMPAZINE) 10 MG tablet Take 1 tablet (10 mg total) by mouth every 6 (six) hours as needed (Nausea or vomiting). Qty: 30 tablet, Refills: 1   Associated Diagnoses: Bence-Jones proteinuria; Multiple myeloma not having achieved remission (HCC)      STOP taking these medications     lenalidomide (REVLIMID) 10 MG capsule      lisinopril-hydrochlorothiazide (PRINZIDE) 10-12.5 MG per tablet      methocarbamol (ROBAXIN) 500 MG tablet         Allergies  Allergen Reactions  . Latex Itching and Rash   Follow-up Information    Follow up with Minerva Ends, MD. Schedule an appointment as soon as possible for a visit in 2 weeks.   Specialty:  Family Medicine   Why:  f/u in 1-2 weeks   Contact information:   [place] [id number] [phone]       Follow up with [facility], NI, MD On 09/22/2015.   Specialty:  Hematology and Oncology   Contact information:   [place] [id number] [phone]        The results of significant diagnostics from this hospitalization (including imaging, microbiology, ancillary and laboratory) are listed below for reference.    Significant Diagnostic Studies: Dg Chest 2 View  09/13/2015  CLINICAL DATA:  Fever, cough today. EXAM: CHEST  2 VIEW COMPARISON:  09/12/2015 FINDINGS: Patchy opacity posteriorly on the lateral view could reflect atelectasis or infiltrate, likely in the left lower lobe. Right lung is clear. Heart is normal size. No effusions or acute bony abnormality. IMPRESSION: Opacity noted posteriorly on the lateral view, likely in the left lower lobe which could reflect atelectasis or pneumonia Electronically Signed   By: Rolm Baptise M.D.   On: 09/13/2015 08:20   Dg Chest 2 View  09/12/2015  CLINICAL DATA:  Cough and fever with shortness of Breath EXAM: CHEST  2 VIEW COMPARISON:  07/17/2015 FINDINGS: Cardiac shadow is stable. The lungs are well aerated bilaterally. Very minimal left basilar atelectasis is noted. No focal confluent infiltrate or sizable effusion is noted. No bony abnormality is seen. IMPRESSION: Minimal left basilar atelectasis. Electronically Signed   By: Inez Catalina M.D.   On: 09/12/2015 07:26   Ct Abdomen Pelvis W Contrast  09/12/2015  CLINICAL DATA:  Fever, abdominal pain, cough and shortness of breath. Multiple myeloma. EXAM: CT ABDOMEN AND PELVIS WITH CONTRAST TECHNIQUE: Multidetector CT imaging of the abdomen and pelvis was  performed using the standard protocol following bolus administration of intravenous contrast. CONTRAST:  16m OMNIPAQUE IOHEXOL 300 MG/ML SOLN, 1033mOMNIPAQUE IOHEXOL 300 MG/ML SOLN COMPARISON:  12/28/2014 FINDINGS: Circumferential wall thickening in the distal esophagus again noted, similar to prior study. Stable paraesophageal lymph node. Heart is normal size. Dependent opacities in the lung bases, most likely atelectasis. No pleural effusions. Mild diffuse fatty infiltration of the liver. No focal abnormality. Gallbladder, spleen, pancreas, adrenals and kidneys are unremarkable. Appendix is visualized and is normal. Scattered colonic diverticula. No active diverticulitis. Stomach and small bowel are decompressed. No free fluid, free air or adenopathy. Urinary bladder is unremarkable. Aorta is normal caliber. Large lucent lesion within the T10 vertebral body, new since prior study. Smaller lucent areas in T11 and T12 and scattered  throughout the lumbar spine, new since prior study, compatible with myeloma. Lucent areas noted within the proximal femurs bilaterally. IMPRESSION: No acute findings in the abdomen or pelvis. Scattered diverticula of the colon without active diverticulitis. Dependent opacities in lung bases, likely atelectasis. Mild circumferential wall thickening of the distal esophagus, possibly esophagitis. Mild fatty infiltration of the liver. New lucent areas throughout the visualized thoracic and lumbar spine as well as proximal femurs compatible with given history of myeloma. These are new or worsened since prior study. Electronically Signed   By: Rolm Baptise M.D.   On: 09/12/2015 10:00    Microbiology: Recent Results (from the past 240 hour(s))  Culture, blood (routine x 2)     Status: None (Preliminary result)   Collection Time: 09/12/15  7:30 AM  Result Value Ref Range Status   Specimen Description BLOOD RIGHT ANTECUBITAL  Final   Special Requests BOTTLES DRAWN AEROBIC AND ANAEROBIC  5CC  Final   Culture   Final    NO GROWTH 4 DAYS Performed at [facility]    Report Status PENDING  Incomplete  Culture, blood (routine x 2)     Status: None (Preliminary result)   Collection Time: 09/12/15  7:43 AM  Result Value Ref Range Status   Specimen Description BLOOD RIGHT HAND  Final   Special Requests BOTTLES DRAWN AEROBIC ONLY 4CC  Final   Culture   Final    NO GROWTH 4 DAYS Performed at [facility]    Report Status PENDING  Incomplete  Urine culture     Status: None   Collection Time: 09/12/15  7:55 AM  Result Value Ref Range Status   Specimen Description URINE, CLEAN CATCH  Final   Special Requests NONE  Final   Culture   Final    MULTIPLE SPECIES PRESENT, SUGGEST RECOLLECTION Performed at [facility]    Report Status 09/13/2015 FINAL  Final     Labs: Basic Metabolic Panel:  Recent Labs Lab 09/12/15 0738 09/13/15 0410 09/14/15 0440 09/15/15 0420 09/16/15 0425  NA 135 136 141 143 142  K 4.0 3.6 3.7 3.8 4.1  CL 97* 101 106 111 112*  CO2 27 25 27 25 23   GLUCOSE 97 104* 108* 97 105*  BUN 17 18 11 11 13   CREATININE 1.54* 1.57* 1.18 0.97 1.13  CALCIUM 9.7 8.9 8.9 8.4* 8.2*  MG  --   --   --  1.8  --    Liver Function Tests:  Recent Labs Lab 09/12/15 0738 09/13/15 0410 09/14/15 0440  AST 34 31 33  ALT 37 36 37  ALKPHOS 61 50 47  BILITOT 0.6 0.5 0.4  PROT 6.9 6.0* 6.1*  ALBUMIN 4.2 3.4* 3.5   No results for input(s): LIPASE, AMYLASE in the last 168 hours. No results for input(s): AMMONIA in the last 168 hours. CBC:  Recent Labs Lab 09/12/15 0738 09/13/15 0410 09/14/15 0440 09/15/15 0420 09/16/15 0425  WBC 5.4 2.9* 1.9* 9.5 17.0*  NEUTROABS 3.7  --  0.9* 7.8* 14.1*  HGB 12.6* 11.6* 12.0* 11.1* 11.6*  HCT 36.2* 33.2* 35.3* 32.5* 34.0*  MCV 92.1 94.3 93.9 94.2 95.2  PLT 157 125* 95* 79* 59*   Cardiac Enzymes: No results for input(s): CKTOTAL, CKMB, CKMBINDEX, TROPONINI in the last 168 hours. BNP: BNP (last 3  results) No results for input(s): BNP in the last 8760 hours.  ProBNP (last 3 results) No results for input(s): PROBNP in the last 8760 hours.  CBG:  No results for input(s): GLUCAP in the last 168 hours.     SignedIrine Seal MD.  [facility] 09/16/2015, 11:47 AM

## 2015-09-16 NOTE — Progress Notes (Signed)
Erik Nguyen   DOB:07/22/1956   IO#:[id number]    Subjective: The patient feels better. Denies further fevers or chills. He continues to have discomfort/bone pain around his rib cage but not severe.  Objective:  Filed Vitals:   09/15/15 2110 09/16/15 0528  BP: 121/79 138/90  Pulse: 80 67  Temp: 98 F (36.7 C) 98.4 F (36.9 C)  Resp: 16 16     Intake/Output Summary (Last 24 hours) at 09/16/15 1032 Last data filed at 09/16/15 0510  Gross per 24 hour  Intake    480 ml  Output   1425 ml  Net   -945 ml    GENERAL:alert, no distress and comfortable SKIN: skin color, texture, turgor are normal, no rashes or significant lesions EYES: normal, Conjunctiva are pink and non-injected, sclera clear OROPHARYNX:no exudate, no erythema and lips, buccal mucosa, and tongue normal  NECK: supple, thyroid normal size, non-tender, without nodularity LYMPH:  no palpable lymphadenopathy in the cervical, axillary or inguinal LUNGS: clear to auscultation and percussion with normal breathing effort HEART: regular rate & rhythm and no murmurs and no lower extremity edema ABDOMEN:abdomen soft, non-tender and normal bowel sounds Musculoskeletal:no cyanosis of digits and no clubbing  NEURO: alert & oriented x 3 with fluent speech, no focal motor/sensory deficits   Labs:  Lab Results  Component Value Date   WBC 17.0* 09/16/2015   HGB 11.6* 09/16/2015   HCT 34.0* 09/16/2015   MCV 95.2 09/16/2015   PLT 59* 09/16/2015   NEUTROABS 14.1* 09/16/2015    Lab Results  Component Value Date   NA 142 09/16/2015   K 4.1 09/16/2015   CL 112* 09/16/2015   CO2 23 09/16/2015   Assessment & Plan:   Recent diagnosis of multiple myeloma I will put his treatment on hold. Revlimid can cause further pancytopenia. I will cancel his chemotherapy this week until he gets better  Pancytopenia due to treatment I will hold all treatment as above. With diagnosis of infection and recent fever, I recommend starting him  on G-CSF daily until [place] is greater than 1.5 he received 1 dose, 300 g of Granix on 09/14/2015 with resolution of neutropenia. No further G-CSF as needed He is not symptomatic from anemia and denies bleeding from thrombocytopenia He does not require blood transfusion in those regard  Diffuse bone pain secondary to multiple myeloma History of narcotic abuse The patient is quite adamant that he does not want to use a lot of narcotic prescription due to history of narcotic abuse He understood that the bone pain is a temporary phenomenon until his disease is well treated  Esophagitis CT scan show evidence of esophagitis I will cover him with pantoprazole daily  Left lower lobe atelectasis versus pneumonia He is currently on broad-spectrum IV antibiotics I will defer to your pharmacy and primary service for antibiotic management he responded well to G-CSF I started him on incentive spirometry hopefully with culture negative, antibiotic therapy can be simplified  DVT prophylaxis He is on Lovenox Hold treatment if platelet count is less than 50,000  Chronic constipation He is on daily laxatives I encourage ambulation  CODE STATUS Full code  Discharge planning Overall, he is better. Hopefully he can be discharged by today, defer to primary service I have tentatively made an appointment to see him back in one week on 09/22/2015 Will sign off Call if questions arise   [facility], [place], MD 09/16/2015  10:32 AM

## 2015-09-17 ENCOUNTER — Telehealth: Payer: Self-pay | Admitting: *Deleted

## 2015-09-17 ENCOUNTER — Ambulatory Visit: Payer: No Typology Code available for payment source

## 2015-09-17 LAB — CULTURE, BLOOD (ROUTINE X 2)
CULTURE: NO GROWTH
Culture: NO GROWTH

## 2015-09-17 NOTE — Telephone Encounter (Signed)
Per staff message and POF I have scheduled appts. Advised scheduler of appts. JMW  

## 2015-09-21 ENCOUNTER — Telehealth: Payer: Self-pay | Admitting: Hematology and Oncology

## 2015-09-21 NOTE — Telephone Encounter (Signed)
Called patient and confirmed appointments made for patient per 1/31 pof

## 2015-09-22 ENCOUNTER — Telehealth: Payer: Self-pay | Admitting: Hematology and Oncology

## 2015-09-22 ENCOUNTER — Encounter: Payer: Self-pay | Admitting: Hematology and Oncology

## 2015-09-22 ENCOUNTER — Other Ambulatory Visit (HOSPITAL_BASED_OUTPATIENT_CLINIC_OR_DEPARTMENT_OTHER): Payer: No Typology Code available for payment source

## 2015-09-22 ENCOUNTER — Ambulatory Visit (HOSPITAL_BASED_OUTPATIENT_CLINIC_OR_DEPARTMENT_OTHER): Payer: No Typology Code available for payment source | Admitting: Hematology and Oncology

## 2015-09-22 ENCOUNTER — Ambulatory Visit (HOSPITAL_BASED_OUTPATIENT_CLINIC_OR_DEPARTMENT_OTHER): Payer: No Typology Code available for payment source

## 2015-09-22 ENCOUNTER — Telehealth: Payer: Self-pay | Admitting: *Deleted

## 2015-09-22 ENCOUNTER — Other Ambulatory Visit: Payer: Self-pay | Admitting: *Deleted

## 2015-09-22 VITALS — BP 113/67 | HR 72 | Temp 97.9°F | Resp 18 | Wt 166.8 lb

## 2015-09-22 DIAGNOSIS — R803 Bence Jones proteinuria: Secondary | ICD-10-CM

## 2015-09-22 DIAGNOSIS — G893 Neoplasm related pain (acute) (chronic): Secondary | ICD-10-CM

## 2015-09-22 DIAGNOSIS — C9000 Multiple myeloma not having achieved remission: Secondary | ICD-10-CM

## 2015-09-22 DIAGNOSIS — K5909 Other constipation: Secondary | ICD-10-CM

## 2015-09-22 DIAGNOSIS — K209 Esophagitis, unspecified without bleeding: Secondary | ICD-10-CM

## 2015-09-22 DIAGNOSIS — D6481 Anemia due to antineoplastic chemotherapy: Secondary | ICD-10-CM

## 2015-09-22 DIAGNOSIS — Z5112 Encounter for antineoplastic immunotherapy: Secondary | ICD-10-CM

## 2015-09-22 DIAGNOSIS — T451X5A Adverse effect of antineoplastic and immunosuppressive drugs, initial encounter: Secondary | ICD-10-CM

## 2015-09-22 LAB — COMPREHENSIVE METABOLIC PANEL
ALT: 65 U/L — ABNORMAL HIGH (ref 0–55)
ANION GAP: 10 meq/L (ref 3–11)
AST: 27 U/L (ref 5–34)
Albumin: 3.8 g/dL (ref 3.5–5.0)
Alkaline Phosphatase: 85 U/L (ref 40–150)
BILIRUBIN TOTAL: 0.43 mg/dL (ref 0.20–1.20)
BUN: 17.5 mg/dL (ref 7.0–26.0)
CO2: 26 meq/L (ref 22–29)
Calcium: 9.4 mg/dL (ref 8.4–10.4)
Chloride: 106 mEq/L (ref 98–109)
Creatinine: 1.1 mg/dL (ref 0.7–1.3)
EGFR: 84 mL/min/{1.73_m2} — AB (ref >90)
Glucose: 123 mg/dl (ref 70–140)
POTASSIUM: 3.9 meq/L (ref 3.5–5.1)
Sodium: 143 mEq/L (ref 136–145)
TOTAL PROTEIN: 6.8 g/dL (ref 6.4–8.3)

## 2015-09-22 LAB — CBC WITH DIFFERENTIAL/PLATELET
BASO%: 0.9 % (ref 0.0–2.0)
BASOS ABS: 0 10*3/uL (ref 0.0–0.1)
EOS ABS: 0.1 10*3/uL (ref 0.0–0.5)
EOS%: 3.3 % (ref 0.0–7.0)
HCT: 35.9 % — ABNORMAL LOW (ref 38.4–49.9)
HGB: 12.6 g/dL — ABNORMAL LOW (ref 13.0–17.1)
LYMPH#: 2 10*3/uL (ref 0.9–3.3)
LYMPH%: 45.4 % (ref 14.0–49.0)
MCH: 32.6 pg (ref 27.2–33.4)
MCHC: 35 g/dL (ref 32.0–36.0)
MCV: 93.1 fL (ref 79.3–98.0)
MONO#: 0.7 10*3/uL (ref 0.1–0.9)
MONO%: 16.8 % — ABNORMAL HIGH (ref 0.0–14.0)
NEUT%: 33.6 % — AB (ref 39.0–75.0)
NEUTROS ABS: 1.5 10*3/uL (ref 1.5–6.5)
PLATELETS: 283 10*3/uL (ref 140–400)
RBC: 3.86 10*6/uL — AB (ref 4.20–5.82)
RDW: 13.1 % (ref 11.0–14.6)
WBC: 4.4 10*3/uL (ref 4.0–10.3)

## 2015-09-22 MED ORDER — PROCHLORPERAZINE MALEATE 10 MG PO TABS
10.0000 mg | ORAL_TABLET | Freq: Once | ORAL | Status: AC
  Administered 2015-09-22: 10 mg via ORAL

## 2015-09-22 MED ORDER — BORTEZOMIB CHEMO SQ INJECTION 3.5 MG (2.5MG/ML)
1.3000 mg/m2 | Freq: Once | INTRAMUSCULAR | Status: AC
  Administered 2015-09-22: 2.5 mg via SUBCUTANEOUS
  Filled 2015-09-22: qty 2.5

## 2015-09-22 MED ORDER — LENALIDOMIDE 10 MG PO CAPS: 10.0000 mg | ORAL_CAPSULE | Freq: Every day | ORAL | Status: DC

## 2015-09-22 MED ORDER — PROCHLORPERAZINE MALEATE 10 MG PO TABS
ORAL_TABLET | ORAL | Status: AC
  Filled 2015-09-22: qty 1

## 2015-09-22 MED FILL — traMADol HCL 50 MG TABS: 50 | 4 days supply | Qty: 30 | Fill #0

## 2015-09-22 MED FILL — ?PANTOPRAZOLE SOD DR 40MG: 40 MG | 30 days supply | Qty: 30 | Fill #0

## 2015-09-22 MED FILL — POLYETHYLENE GLYCOL 3350: 7 days supply | Qty: 255 | Fill #0

## 2015-09-22 NOTE — Assessment & Plan Note (Signed)
Recent CT scan show evidence of esophagitis. I recommend he takes Protonix

## 2015-09-22 NOTE — Progress Notes (Signed)
Pt is approved for the $400 [facility] grant.  °

## 2015-09-22 NOTE — Telephone Encounter (Signed)
Per staff message and POF I have scheduled appts. Advised scheduler of appts. JMW  

## 2015-09-22 NOTE — Assessment & Plan Note (Signed)
The patient is adamant that he does not want to take narcotic therapy. He is currently taking tramadol as needed. We'll continue the same

## 2015-09-22 NOTE — Patient Instructions (Signed)
[facility] Discharge Instructions for Patients Receiving Chemotherapy  Today you received the following chemotherapy agents VELCADE  To help prevent nausea and vomiting after your treatment, we encourage you to take your nausea medication as prescribed.   If you develop nausea and vomiting that is not controlled by your nausea medication, call the clinic.   BELOW ARE SYMPTOMS THAT SHOULD BE REPORTED IMMEDIATELY:  *FEVER GREATER THAN 100.5 F  *CHILLS WITH OR WITHOUT FEVER  NAUSEA AND VOMITING THAT IS NOT CONTROLLED WITH YOUR NAUSEA MEDICATION  *UNUSUAL SHORTNESS OF BREATH  *UNUSUAL BRUISING OR BLEEDING  TENDERNESS IN MOUTH AND THROAT WITH OR WITHOUT PRESENCE OF ULCERS  *URINARY PROBLEMS  *BOWEL PROBLEMS  UNUSUAL RASH Items with * indicate a potential emergency and should be followed up as soon as possible.  Feel free to call the clinic you have any questions or concerns. The clinic phone number is [phone].  Please show the CHEMO ALERT CARD at check-in to the Emergency Department and triage nurse.   

## 2015-09-22 NOTE — Assessment & Plan Note (Signed)
He has chronic constipation. We discussed laxative regimen

## 2015-09-22 NOTE — Progress Notes (Signed)
[place] [facility] OFFICE PROGRESS NOTE  Patient Care Team: Boykin Nearing, MD as PCP - General (Family Medicine)  SUMMARY OF ONCOLOGIC HISTORY:   Multiple myeloma not having achieved remission ([place])   08/28/2015 Bone Marrow Biopsy Accession: [id number] biopsy showed 58% myeloma involvement. Cytogenetics was normal and FISH showed Del 13q and t(11;14).    09/07/2015 -  Chemotherapy he was started on combined treatment of Velcade and Revlimid with Dex   09/12/2015 - 09/16/2015 Hospital Admission He was admitted to the hospital with sepsis, treatment was placed on hold and he received one dose of GCSF for leukopenia    INTERVAL HISTORY: Please see below for problem oriented charting.  he returns for further follow-up. He felt better. His bone pain is less. He denies fevers or chills. Appetite is stable. He complained of fatigue  REVIEW OF SYSTEMS:   Constitutional: Denies fevers, chills or abnormal weight loss Eyes: Denies blurriness of vision Ears, nose, mouth, throat, and face: Denies mucositis or sore throat Respiratory: Denies cough, dyspnea or wheezes Cardiovascular: Denies palpitation, chest discomfort or lower extremity swelling Gastrointestinal:  Denies nausea, heartburn or change in bowel habits Skin: Denies abnormal skin rashes Lymphatics: Denies new lymphadenopathy or easy bruising Neurological:Denies numbness, tingling or new weaknesses Behavioral/Psych: Mood is stable, no new changes  All other systems were reviewed with the patient and are negative.  I have reviewed the past medical history, past surgical history, social history and family history with the patient and they are unchanged from previous note.  ALLERGIES:  is allergic to latex.  MEDICATIONS:  Current Outpatient Prescriptions  Medication Sig Dispense Refill  . acyclovir (ZOVIRAX) 400 MG tablet Take 1 tablet (400 mg total) by mouth 2 (two) times daily. 60 tablet 3  . amLODipine (NORVASC) 5 MG  tablet Take 1 tablet (5 mg total) by mouth daily. 30 tablet 0  . aspirin EC 81 MG tablet Take 81 mg by mouth daily.    . cholecalciferol (VITAMIN D) 1000 units tablet Take 2,000 Units by mouth daily.    Marland Kitchen dexamethasone (DECADRON) 4 MG tablet Take 5 tablets (20 mg) weekly on Mondays and Thursdays 60 tablet 3  . diclofenac sodium (VOLTAREN) 1 % GEL Apply 2 g topically 4 (four) times daily. 100 g 2  . lisinopril-hydrochlorothiazide (PRINZIDE,ZESTORETIC) 10-12.5 MG tablet Take 1 tablet by mouth daily.    . meloxicam (MOBIC) 15 MG tablet Take 1 tablet (15 mg total) by mouth daily. 30 tablet 0  . ondansetron (ZOFRAN) 8 MG tablet Take 1 tablet (8 mg total) by mouth every 8 (eight) hours as needed (Nausea or vomiting). 30 tablet 1  . pantoprazole (PROTONIX) 40 MG tablet Take 1 tablet (40 mg total) by mouth daily. 30 tablet 0  . polyethylene glycol (MIRALAX / GLYCOLAX) packet Take 17 g by mouth 2 (two) times daily. 14 each 0  . prochlorperazine (COMPAZINE) 10 MG tablet Take 1 tablet (10 mg total) by mouth every 6 (six) hours as needed (Nausea or vomiting). 30 tablet 1  . senna-docusate (SENOKOT-S) 8.6-50 MG tablet Take 1 tablet by mouth 2 (two) times daily.    . traMADol (ULTRAM) 50 MG tablet Take 1-2 tablets (50-100 mg total) by mouth every 6 (six) hours as needed. 30 tablet 0   No current facility-administered medications for this visit.   Facility-Administered Medications Ordered in Other Visits  Medication Dose Route Frequency Provider Last Rate Last Dose  . bortezomib SQ (VELCADE) chemo injection 2.5 mg  1.3 mg/m2 (Treatment  Plan Actual) Subcutaneous Once Heath Lark, MD        PHYSICAL EXAMINATION: ECOG PERFORMANCE STATUS: 1 - Symptomatic but completely ambulatory  Filed Vitals:   09/22/15 1005  BP: 113/67  Pulse: 72  Temp: 97.9 F (36.6 C)  Resp: 18   Filed Weights   09/22/15 1005  Weight: 166 lb 12.8 oz (75.66 kg)    GENERAL:alert, no distress and comfortable SKIN: skin color,  texture, turgor are normal, no rashes or significant lesions EYES: normal, Conjunctiva are pink and non-injected, sclera clear OROPHARYNX:no exudate, no erythema and lips, buccal mucosa, and tongue normal  NECK: supple, thyroid normal size, non-tender, without nodularity LYMPH:  no palpable lymphadenopathy in the cervical, axillary or inguinal LUNGS: clear to auscultation and percussion with normal breathing effort HEART: regular rate & rhythm and no murmurs and no lower extremity edema ABDOMEN:abdomen soft, non-tender and normal bowel sounds Musculoskeletal:no cyanosis of digits and no clubbing  NEURO: alert & oriented x 3 with fluent speech, no focal motor/sensory deficits  LABORATORY DATA:  I have reviewed the data as listed    Component Value Date/Time   NA 143 09/22/2015 0950   NA 142 09/16/2015 0425   K 3.9 09/22/2015 0950   K 4.1 09/16/2015 0425   CL 112* 09/16/2015 0425   CO2 26 09/22/2015 0950   CO2 23 09/16/2015 0425   GLUCOSE 123 09/22/2015 0950   GLUCOSE 105* 09/16/2015 0425   BUN 17.5 09/22/2015 0950   BUN 13 09/16/2015 0425   CREATININE 1.1 09/22/2015 0950   CREATININE 1.13 09/16/2015 0425   CREATININE 1.13 07/24/2015 1155   CALCIUM 9.4 09/22/2015 0950   CALCIUM 8.2* 09/16/2015 0425   PROT 6.8 09/22/2015 0950   PROT 6.1* 09/14/2015 0440   ALBUMIN 3.8 09/22/2015 0950   ALBUMIN 3.5 09/14/2015 0440   AST 27 09/22/2015 0950   AST 33 09/14/2015 0440   ALT 65* 09/22/2015 0950   ALT 37 09/14/2015 0440   ALKPHOS 85 09/22/2015 0950   ALKPHOS 47 09/14/2015 0440   BILITOT 0.43 09/22/2015 0950   BILITOT 0.4 09/14/2015 0440   GFRNONAA >60 09/16/2015 0425   GFRNONAA 71 07/24/2015 1155   GFRAA >60 09/16/2015 0425   GFRAA 82 07/24/2015 1155    No results found for: SPEP, UPEP  Lab Results  Component Value Date   WBC 4.4 09/22/2015   NEUTROABS 1.5 09/22/2015   HGB 12.6* 09/22/2015   HCT 35.9* 09/22/2015   MCV 93.1 09/22/2015   PLT 283 09/22/2015       Chemistry      Component Value Date/Time   NA 143 09/22/2015 0950   NA 142 09/16/2015 0425   K 3.9 09/22/2015 0950   K 4.1 09/16/2015 0425   CL 112* 09/16/2015 0425   CO2 26 09/22/2015 0950   CO2 23 09/16/2015 0425   BUN 17.5 09/22/2015 0950   BUN 13 09/16/2015 0425   CREATININE 1.1 09/22/2015 0950   CREATININE 1.13 09/16/2015 0425   CREATININE 1.13 07/24/2015 1155      Component Value Date/Time   CALCIUM 9.4 09/22/2015 0950   CALCIUM 8.2* 09/16/2015 0425   ALKPHOS 85 09/22/2015 0950   ALKPHOS 47 09/14/2015 0440   AST 27 09/22/2015 0950   AST 33 09/14/2015 0440   ALT 65* 09/22/2015 0950   ALT 37 09/14/2015 0440   BILITOT 0.43 09/22/2015 0950   BILITOT 0.4 09/14/2015 0440      ASSESSMENT & PLAN:  Multiple myeloma not having achieved remission (  [place])  The patient recently developed infection, resolved. His treatment was held for a week. We will resume treatment today without dose adjustment. He has not obtain dental clearance yet and so I'm not prescribing Zometa. He will continue acyclovir, calcium, vitamin D and aspirin  Anemia due to antineoplastic chemotherapy This is likely due to recent treatment. The patient denies recent history of bleeding such as epistaxis, hematuria or hematochezia. He is asymptomatic from the anemia. I will observe for now.  He does not require transfusion now. I will continue the chemotherapy at current dose without dosage adjustment.  If the anemia gets progressive worse in the future, I might have to delay his treatment or adjust the chemotherapy dose.   Cancer associated pain  The patient is adamant that he does not want to take narcotic therapy. He is currently taking tramadol as needed. We'll continue the same  Constipation  He has chronic constipation. We discussed laxative regimen  Esophagitis  Recent CT scan show evidence of esophagitis. I recommend he takes Protonix   No orders of the defined types were placed in this  encounter.   All questions were answered. The patient knows to call the clinic with any problems, questions or concerns. No barriers to learning was detected. I spent 25 minutes counseling the patient face to face. The total time spent in the appointment was 30 minutes and more than 50% was on counseling and review of test results     [facility], [place], MD 09/22/2015 11:31 AM

## 2015-09-22 NOTE — Telephone Encounter (Signed)
Pt confirmed labs/ov per 02/07 POF, gave pt AVS and Calendar... KJ, sent msg to add chemo °

## 2015-09-22 NOTE — Assessment & Plan Note (Signed)
This is likely due to recent treatment. The patient denies recent history of bleeding such as epistaxis, hematuria or hematochezia. He is asymptomatic from the anemia. I will observe for now.  He does not require transfusion now. I will continue the chemotherapy at current dose without dosage adjustment.  If the anemia gets progressive worse in the future, I might have to delay his treatment or adjust the chemotherapy dose.  

## 2015-09-22 NOTE — Assessment & Plan Note (Signed)
The patient recently developed infection, resolved. His treatment was held for a week. We will resume treatment today without dose adjustment. He has not obtain dental clearance yet and so I'm not prescribing Zometa. He will continue acyclovir, calcium, vitamin D and aspirin

## 2015-09-25 ENCOUNTER — Ambulatory Visit (HOSPITAL_BASED_OUTPATIENT_CLINIC_OR_DEPARTMENT_OTHER): Payer: No Typology Code available for payment source

## 2015-09-25 ENCOUNTER — Encounter: Payer: Self-pay | Admitting: Pharmacist

## 2015-09-25 VITALS — BP 130/82 | HR 77 | Temp 97.6°F | Resp 18

## 2015-09-25 DIAGNOSIS — R803 Bence Jones proteinuria: Secondary | ICD-10-CM

## 2015-09-25 DIAGNOSIS — Z5112 Encounter for antineoplastic immunotherapy: Secondary | ICD-10-CM

## 2015-09-25 DIAGNOSIS — C9000 Multiple myeloma not having achieved remission: Secondary | ICD-10-CM

## 2015-09-25 MED ORDER — PROCHLORPERAZINE MALEATE 10 MG PO TABS
10.0000 mg | ORAL_TABLET | Freq: Once | ORAL | Status: AC
  Administered 2015-09-25: 10 mg via ORAL

## 2015-09-25 MED ORDER — BORTEZOMIB CHEMO SQ INJECTION 3.5 MG (2.5MG/ML)
1.3000 mg/m2 | Freq: Once | INTRAMUSCULAR | Status: AC
  Administered 2015-09-25: 2.5 mg via SUBCUTANEOUS
  Filled 2015-09-25: qty 2.5

## 2015-09-25 MED ORDER — PROCHLORPERAZINE MALEATE 10 MG PO TABS
ORAL_TABLET | ORAL | Status: AC
  Filled 2015-09-25: qty 1

## 2015-09-25 NOTE — Progress Notes (Signed)
Oral Chemotherapy Follow-Up Form  Original Start date of oral chemotherapy: _1/26/17___   Called patient today to follow up regarding patient's oral chemotherapy medication: _Revlimid/Velcade__  Pt is doing well today. No complaints or issues to report. He has restarted treatment after holding for pancytopenia/infection. He received velcade earlier this week and restarted the Revlimid. Doing well. He reports issues with delivery of Revlimid from [employer] due to patient not being home. It may be best to have biologics ship to cancer center and patient can pick up on days when he is being treated. Will look into this  Pt reports __1 week_missed in the last month.  Missed dose(s) attributed to: _Infection_______  Pt reports the following side effects: _none__    Will follow up and call patient again in _3 weeks___   Thank you,  Montel Clock, PharmD, [facility]

## 2015-09-25 NOTE — Patient Instructions (Addendum)
[facility] Discharge Instructions for Patients Receiving Chemotherapy  Today you received the following chemotherapy agents: Velcade.   To help prevent nausea and vomiting after your treatment, we encourage you to take your nausea medication: Zofran. Take one every 8 hours as needed. You may also take Compazine every 6 hours as needed.   If you develop nausea and vomiting that is not controlled by your nausea medication, call the clinic.   BELOW ARE SYMPTOMS THAT SHOULD BE REPORTED IMMEDIATELY:  *FEVER GREATER THAN 100.5 F  *CHILLS WITH OR WITHOUT FEVER  NAUSEA AND VOMITING THAT IS NOT CONTROLLED WITH YOUR NAUSEA MEDICATION  *UNUSUAL SHORTNESS OF BREATH  *UNUSUAL BRUISING OR BLEEDING  TENDERNESS IN MOUTH AND THROAT WITH OR WITHOUT PRESENCE OF ULCERS  *URINARY PROBLEMS  *BOWEL PROBLEMS  UNUSUAL RASH Items with * indicate a potential emergency and should be followed up as soon as possible.  Feel free to call the clinic should you have any questions or concerns. The clinic phone number is [phone].  Please show the [facility] at check-in to the Emergency Department and triage nurse.

## 2015-10-05 ENCOUNTER — Ambulatory Visit (HOSPITAL_BASED_OUTPATIENT_CLINIC_OR_DEPARTMENT_OTHER): Payer: No Typology Code available for payment source

## 2015-10-05 ENCOUNTER — Ambulatory Visit (HOSPITAL_BASED_OUTPATIENT_CLINIC_OR_DEPARTMENT_OTHER): Payer: No Typology Code available for payment source | Admitting: Hematology and Oncology

## 2015-10-05 ENCOUNTER — Telehealth: Payer: Self-pay | Admitting: Hematology and Oncology

## 2015-10-05 ENCOUNTER — Other Ambulatory Visit (HOSPITAL_BASED_OUTPATIENT_CLINIC_OR_DEPARTMENT_OTHER): Payer: No Typology Code available for payment source

## 2015-10-05 ENCOUNTER — Encounter: Payer: Self-pay | Admitting: Hematology and Oncology

## 2015-10-05 ENCOUNTER — Telehealth: Payer: Self-pay | Admitting: *Deleted

## 2015-10-05 VITALS — BP 120/84 | HR 79 | Temp 98.1°F | Resp 18 | Ht 66.0 in | Wt 166.5 lb

## 2015-10-05 DIAGNOSIS — C9000 Multiple myeloma not having achieved remission: Secondary | ICD-10-CM

## 2015-10-05 DIAGNOSIS — R5383 Other fatigue: Secondary | ICD-10-CM

## 2015-10-05 DIAGNOSIS — R803 Bence Jones proteinuria: Secondary | ICD-10-CM

## 2015-10-05 DIAGNOSIS — T451X5A Adverse effect of antineoplastic and immunosuppressive drugs, initial encounter: Secondary | ICD-10-CM

## 2015-10-05 DIAGNOSIS — G893 Neoplasm related pain (acute) (chronic): Secondary | ICD-10-CM

## 2015-10-05 DIAGNOSIS — Z5112 Encounter for antineoplastic immunotherapy: Secondary | ICD-10-CM

## 2015-10-05 DIAGNOSIS — D6481 Anemia due to antineoplastic chemotherapy: Secondary | ICD-10-CM

## 2015-10-05 LAB — CBC WITH DIFFERENTIAL/PLATELET
BASO%: 1.2 % (ref 0.0–2.0)
BASOS ABS: 0 10*3/uL (ref 0.0–0.1)
EOS ABS: 0.2 10*3/uL (ref 0.0–0.5)
EOS%: 3.9 % (ref 0.0–7.0)
HEMATOCRIT: 37.2 % — AB (ref 38.4–49.9)
HEMOGLOBIN: 12.6 g/dL — AB (ref 13.0–17.1)
LYMPH#: 0.9 10*3/uL (ref 0.9–3.3)
LYMPH%: 22.6 % (ref 14.0–49.0)
MCH: 31.9 pg (ref 27.2–33.4)
MCHC: 33.8 g/dL (ref 32.0–36.0)
MCV: 94.5 fL (ref 79.3–98.0)
MONO#: 0.4 10*3/uL (ref 0.1–0.9)
MONO%: 8.6 % (ref 0.0–14.0)
NEUT%: 63.7 % (ref 39.0–75.0)
NEUTROS ABS: 2.6 10*3/uL (ref 1.5–6.5)
PLATELETS: 267 10*3/uL (ref 140–400)
RBC: 3.94 10*6/uL — ABNORMAL LOW (ref 4.20–5.82)
RDW: 14 % (ref 11.0–14.6)
WBC: 4.2 10*3/uL (ref 4.0–10.3)

## 2015-10-05 LAB — COMPREHENSIVE METABOLIC PANEL
ALBUMIN: 3.7 g/dL (ref 3.5–5.0)
ALT: 34 U/L (ref 0–55)
AST: 22 U/L (ref 5–34)
Alkaline Phosphatase: 94 U/L (ref 40–150)
Anion Gap: 9 mEq/L (ref 3–11)
BUN: 14.2 mg/dL (ref 7.0–26.0)
CALCIUM: 9.2 mg/dL (ref 8.4–10.4)
CO2: 25 mEq/L (ref 22–29)
Chloride: 110 mEq/L — ABNORMAL HIGH (ref 98–109)
Creatinine: 1.2 mg/dL (ref 0.7–1.3)
EGFR: 75 mL/min/{1.73_m2} — AB (ref >90)
GLUCOSE: 123 mg/dL (ref 70–140)
POTASSIUM: 3.9 meq/L (ref 3.5–5.1)
Sodium: 144 mEq/L (ref 136–145)
TOTAL PROTEIN: 6.5 g/dL (ref 6.4–8.3)
Total Bilirubin: 0.45 mg/dL (ref 0.20–1.20)

## 2015-10-05 MED ORDER — ACYCLOVIR 400 MG PO TABS: 400.0000 mg | ORAL_TABLET | Freq: Two times a day (BID) | ORAL | Status: DC

## 2015-10-05 MED ORDER — BORTEZOMIB CHEMO SQ INJECTION 3.5 MG (2.5MG/ML)
1.3000 mg/m2 | Freq: Once | INTRAMUSCULAR | Status: AC
  Administered 2015-10-05: 2.5 mg via SUBCUTANEOUS
  Filled 2015-10-05: qty 2.5

## 2015-10-05 MED ORDER — PROCHLORPERAZINE MALEATE 10 MG PO TABS
10.0000 mg | ORAL_TABLET | Freq: Once | ORAL | Status: AC
  Administered 2015-10-05: 10 mg via ORAL

## 2015-10-05 MED ORDER — PROCHLORPERAZINE MALEATE 10 MG PO TABS
ORAL_TABLET | ORAL | Status: AC
  Filled 2015-10-05: qty 1

## 2015-10-05 MED FILL — ACYCLOVIR 400 MG TABLET: 400 | 30 days supply | Qty: 60 | Fill #0

## 2015-10-05 NOTE — Telephone Encounter (Signed)
Referral called to [facility] B[facility]. Will call us back if able to schedule an appt

## 2015-10-05 NOTE — Telephone Encounter (Signed)
Per staff message and POF I have scheduled appts. Advised scheduler of appts. JMW  

## 2015-10-05 NOTE — Patient Instructions (Signed)
[facility] Discharge Instructions for Patients Receiving Chemotherapy  Today you received the following chemotherapy agents velcade   To help prevent nausea and vomiting after your treatment, we encourage you to take your nausea medication as directed  If you develop nausea and vomiting that is not controlled by your nausea medication, call the clinic.   BELOW ARE SYMPTOMS THAT SHOULD BE REPORTED IMMEDIATELY:  *FEVER GREATER THAN 100.5 F  *CHILLS WITH OR WITHOUT FEVER  NAUSEA AND VOMITING THAT IS NOT CONTROLLED WITH YOUR NAUSEA MEDICATION  *UNUSUAL SHORTNESS OF BREATH  *UNUSUAL BRUISING OR BLEEDING  TENDERNESS IN MOUTH AND THROAT WITH OR WITHOUT PRESENCE OF ULCERS  *URINARY PROBLEMS  *BOWEL PROBLEMS  UNUSUAL RASH Items with * indicate a potential emergency and should be followed up as soon as possible.  Feel free to call the clinic you have any questions or concerns. The clinic phone number is [phone].  

## 2015-10-05 NOTE — Telephone Encounter (Signed)
Pt confirmed labs/ov per 02/20 POF, gave pt AVS and Calendar... KJ, sent msg to add chemo °

## 2015-10-06 ENCOUNTER — Telehealth: Payer: Self-pay | Admitting: *Deleted

## 2015-10-06 ENCOUNTER — Ambulatory Visit: Payer: No Typology Code available for payment source | Attending: Family Medicine | Admitting: Family Medicine

## 2015-10-06 ENCOUNTER — Encounter: Payer: Self-pay | Admitting: Family Medicine

## 2015-10-06 VITALS — BP 135/88 | HR 82 | Temp 98.5°F | Resp 16 | Ht 66.0 in | Wt 168.0 lb

## 2015-10-06 DIAGNOSIS — Z0000 Encounter for general adult medical examination without abnormal findings: Secondary | ICD-10-CM

## 2015-10-06 DIAGNOSIS — Z79899 Other long term (current) drug therapy: Secondary | ICD-10-CM | POA: Insufficient documentation

## 2015-10-06 DIAGNOSIS — Z7982 Long term (current) use of aspirin: Secondary | ICD-10-CM | POA: Insufficient documentation

## 2015-10-06 DIAGNOSIS — I10 Essential (primary) hypertension: Secondary | ICD-10-CM

## 2015-10-06 DIAGNOSIS — Z87891 Personal history of nicotine dependence: Secondary | ICD-10-CM | POA: Insufficient documentation

## 2015-10-06 LAB — POCT GLYCOSYLATED HEMOGLOBIN (HGB A1C): HEMOGLOBIN A1C: 5.8

## 2015-10-06 MED ORDER — LISINOPRIL-HYDROCHLOROTHIAZIDE 10-12.5 MG PO TABS: 1.0000 | ORAL_TABLET | Freq: Every day | ORAL | Status: DC

## 2015-10-06 NOTE — Assessment & Plan Note (Addendum)
We will continue treatment today without dose adjustment. He has not obtain dental clearance yet and so I'm not prescribing Zometa. He will continue acyclovir, calcium, vitamin D and aspirin I plan to repeat check myeloma panel next month. The total protein is improving and his bone pain is improving. We discussed the role of stem cell transplant and he is in agreement to proceed to be referred for bone marrow transplant discussion

## 2015-10-06 NOTE — Assessment & Plan Note (Signed)
The patient is adamant that he does not want to take narcotic therapy. He is currently taking tramadol as needed. We'll continue the same He is on calcium and vitamin D supplement

## 2015-10-06 NOTE — Progress Notes (Signed)
HFU elevated temperature, HTN Pain scale # 7 No tobacco user  No suicidal thoughts in the past two weeks

## 2015-10-06 NOTE — Progress Notes (Signed)
Subjective:  Patient ID: Erik Nguyen, male    DOB: 1956/04/18  Age: 60 y.o. MRN: [id number]  CC: Hypertension and Hospitalization Follow-up   HPI Erik Nguyen presents for   1. CHRONIC HYPERTENSION  Disease Monitoring  Blood pressure range: not checking   Chest pain: no   Dyspnea: no   Claudication: no   Medication compliance: yes, reports taking lisinopril 5 mg daily   Medication Side Effects  Lightheadedness: no   Urinary frequency: no   Edema: no     2. HFU fever: he as hospitalized from 09/12/2015 to 09/16/2015 for fever and pancytopenia. He had negative blood and urine culture. He was treated with 3 days of IV antibiotics. His second CXR revealed possible LL pneumonia. He completed 4 more day of Augmentin. He denies recurrent fever. He denies CP and SOB. He admits to fatigue.  He has followed up with his oncologist yesterday repeat CBC revealed improvement in pancytopenia.   Social History  Substance Use Topics  . Smoking status: Former Smoker -- 1.00 packs/day    Types: Cigarettes  . Smokeless tobacco: Never Used  . Alcohol Use: No     Comment: last use Jan 2015     Outpatient Prescriptions Prior to Visit  Medication Sig Dispense Refill  . acyclovir (ZOVIRAX) 400 MG tablet Take 1 tablet (400 mg total) by mouth 2 (two) times daily. 60 tablet 3  . amLODipine (NORVASC) 5 MG tablet Take 1 tablet (5 mg total) by mouth daily. 30 tablet 0  . aspirin EC 81 MG tablet Take 81 mg by mouth daily.    . cholecalciferol (VITAMIN D) 1000 units tablet Take 2,000 Units by mouth daily.    Marland Kitchen dexamethasone (DECADRON) 4 MG tablet Take 5 tablets (20 mg) weekly on Mondays and Thursdays 60 tablet 3  . diclofenac sodium (VOLTAREN) 1 % GEL Apply 2 g topically 4 (four) times daily. 100 g 2  . lenalidomide (REVLIMID) 10 MG capsule Take 1 capsule (10 mg total) by mouth daily. 14 capsule 0  . lisinopril-hydrochlorothiazide (PRINZIDE,ZESTORETIC) 10-12.5 MG tablet Take 1 tablet by mouth daily.      . meloxicam (MOBIC) 15 MG tablet Take 1 tablet (15 mg total) by mouth daily. 30 tablet 0  . ondansetron (ZOFRAN) 8 MG tablet Take 1 tablet (8 mg total) by mouth every 8 (eight) hours as needed (Nausea or vomiting). 30 tablet 1  . pantoprazole (PROTONIX) 40 MG tablet Take 1 tablet (40 mg total) by mouth daily. 30 tablet 0  . polyethylene glycol (MIRALAX / GLYCOLAX) packet Take 17 g by mouth 2 (two) times daily. 14 each 0  . prochlorperazine (COMPAZINE) 10 MG tablet Take 1 tablet (10 mg total) by mouth every 6 (six) hours as needed (Nausea or vomiting). 30 tablet 1  . senna-docusate (SENOKOT-S) 8.6-50 MG tablet Take 1 tablet by mouth 2 (two) times daily.    . traMADol (ULTRAM) 50 MG tablet Take 1-2 tablets (50-100 mg total) by mouth every 6 (six) hours as needed. 30 tablet 0   No facility-administered medications prior to visit.    ROS Review of Systems  Constitutional: Positive for fatigue. Negative for fever, chills and unexpected weight change.  Eyes: Negative for visual disturbance.  Respiratory: Negative for cough and shortness of breath.   Cardiovascular: Negative for chest pain, palpitations and leg swelling.  Gastrointestinal: Negative for nausea, vomiting, abdominal pain, diarrhea, constipation and blood in stool.  Endocrine: Negative for polydipsia, polyphagia and polyuria.  Musculoskeletal: Negative for  myalgias, back pain, arthralgias, gait problem and neck pain.  Skin: Negative for rash.  Allergic/Immunologic: Negative for immunocompromised state.  Hematological: Negative for adenopathy. Does not bruise/bleed easily.  Psychiatric/Behavioral: Negative for suicidal ideas, sleep disturbance and dysphoric mood. The patient is not nervous/anxious.     Objective:  BP 135/88 mmHg  Pulse 82  Temp(Src) 98.5 F (36.9 C) (Oral)  Resp 16  Ht 5\' 6"  (1.676 m)  Wt 168 lb (76.204 kg)  BMI 27.13 kg/m2  SpO2 98%  BP/Weight 10/06/2015 10/05/2015 [id number]  Systolic BP [id number]   Diastolic BP 88 84 82  Wt. (Lbs) 168 166.5 -  BMI 27.13 26.89 -   Physical Exam  Constitutional: He appears well-developed and well-nourished. No distress.  HENT:  Head: Normocephalic and atraumatic.  Neck: Normal range of motion. Neck supple.  Cardiovascular: Normal rate, regular rhythm, normal heart sounds and intact distal pulses.   Pulmonary/Chest: Effort normal and breath sounds normal.  Musculoskeletal: He exhibits no edema.  Neurological: He is alert.  Skin: Skin is warm and dry. No rash noted. No erythema.  Psychiatric: He has a normal mood and affect.   Lab Results  Component Value Date   HGBA1C 5.80 10/06/2015    My RMA called patient's pharmacy to verify meds. He last picked up prinzide on 09/07/2105. He has not picked up norvasc.   Assessment & Plan:   Rainen was seen today for hypertension and hospitalization follow-up.  Diagnoses and all orders for this visit:  Healthcare maintenance -     HgB A1c  Essential hypertension -     lisinopril-hydrochlorothiazide (PRINZIDE,ZESTORETIC) 10-12.5 MG tablet; Take 1 tablet by mouth daily. Reported on 10/06/2015    No orders of the defined types were placed in this encounter.    Follow-up: No Follow-up on file.   Boykin Nearing MD

## 2015-10-06 NOTE — Progress Notes (Signed)
[place] OFFICE PROGRESS NOTE  Patient Care Team: Boykin Nearing, MD as PCP - General (Family Medicine)  SUMMARY OF ONCOLOGIC HISTORY:   Multiple myeloma not having achieved remission ([place])   08/28/2015 Bone Marrow Biopsy Accession: [id number] biopsy showed 58% myeloma involvement. Cytogenetics was normal and FISH showed Del 13q and t(11;14).    09/07/2015 -  Chemotherapy he was started on combined treatment of Velcade and Revlimid with Dex   09/12/2015 - 09/16/2015 Hospital Admission He was admitted to the hospital with sepsis, treatment was placed on hold and he received one dose of GCSF for leukopenia    INTERVAL HISTORY: Please see below for problem oriented charting. He feels better. Bone pain is less. Denies neuropathy or skin rashes from injection. His appetite is stable. He complained of excessive fatigue Denies recent infection  REVIEW OF SYSTEMS:   Constitutional: Denies fevers, chills or abnormal weight loss Eyes: Denies blurriness of vision Ears, nose, mouth, throat, and face: Denies mucositis or sore throat Respiratory: Denies cough, dyspnea or wheezes Cardiovascular: Denies palpitation, chest discomfort or lower extremity swelling Gastrointestinal:  Denies nausea, heartburn or change in bowel habits Skin: Denies abnormal skin rashes Lymphatics: Denies new lymphadenopathy or easy bruising Neurological:Denies numbness, tingling or new weaknesses Behavioral/Psych: Mood is stable, no new changes  All other systems were reviewed with the patient and are negative.  I have reviewed the past medical history, past surgical history, social history and family history with the patient and they are unchanged from previous note.  ALLERGIES:  is allergic to latex.  MEDICATIONS:  Current Outpatient Prescriptions  Medication Sig Dispense Refill  . acyclovir (ZOVIRAX) 400 MG tablet Take 1 tablet (400 mg total) by mouth 2 (two) times daily. 60 tablet 3  . amLODipine  (NORVASC) 5 MG tablet Take 1 tablet (5 mg total) by mouth daily. 30 tablet 0  . aspirin EC 81 MG tablet Take 81 mg by mouth daily.    . cholecalciferol (VITAMIN D) 1000 units tablet Take 2,000 Units by mouth daily.    Marland Kitchen dexamethasone (DECADRON) 4 MG tablet Take 5 tablets (20 mg) weekly on Mondays and Thursdays 60 tablet 3  . diclofenac sodium (VOLTAREN) 1 % GEL Apply 2 g topically 4 (four) times daily. 100 g 2  . lenalidomide (REVLIMID) 10 MG capsule Take 1 capsule (10 mg total) by mouth daily. 14 capsule 0  . lisinopril-hydrochlorothiazide (PRINZIDE,ZESTORETIC) 10-12.5 MG tablet Take 1 tablet by mouth daily.    . meloxicam (MOBIC) 15 MG tablet Take 1 tablet (15 mg total) by mouth daily. 30 tablet 0  . ondansetron (ZOFRAN) 8 MG tablet Take 1 tablet (8 mg total) by mouth every 8 (eight) hours as needed (Nausea or vomiting). 30 tablet 1  . pantoprazole (PROTONIX) 40 MG tablet Take 1 tablet (40 mg total) by mouth daily. 30 tablet 0  . polyethylene glycol (MIRALAX / GLYCOLAX) packet Take 17 g by mouth 2 (two) times daily. 14 each 0  . prochlorperazine (COMPAZINE) 10 MG tablet Take 1 tablet (10 mg total) by mouth every 6 (six) hours as needed (Nausea or vomiting). 30 tablet 1  . senna-docusate (SENOKOT-S) 8.6-50 MG tablet Take 1 tablet by mouth 2 (two) times daily.    . traMADol (ULTRAM) 50 MG tablet Take 1-2 tablets (50-100 mg total) by mouth every 6 (six) hours as needed. 30 tablet 0   No current facility-administered medications for this visit.    PHYSICAL EXAMINATION: ECOG PERFORMANCE STATUS: 1 - Symptomatic but  completely ambulatory  Filed Vitals:   10/05/15 1046  BP: 120/84  Pulse: 79  Temp: 98.1 F (36.7 C)  Resp: 18   Filed Weights   10/05/15 1046  Weight: 166 lb 8 oz (75.524 kg)    GENERAL:alert, no distress and comfortable SKIN: skin color, texture, turgor are normal, no rashes or significant lesions EYES: normal, Conjunctiva are pink and non-injected, sclera  clear OROPHARYNX:no exudate, no erythema and lips, buccal mucosa, and tongue normal  NECK: supple, thyroid normal size, non-tender, without nodularity LYMPH:  no palpable lymphadenopathy in the cervical, axillary or inguinal LUNGS: clear to auscultation and percussion with normal breathing effort HEART: regular rate & rhythm and no murmurs and no lower extremity edema ABDOMEN:abdomen soft, non-tender and normal bowel sounds Musculoskeletal:no cyanosis of digits and no clubbing  NEURO: alert & oriented x 3 with fluent speech, no focal motor/sensory deficits  LABORATORY DATA:  I have reviewed the data as listed    Component Value Date/Time   NA 144 10/05/2015 1036   NA 142 09/16/2015 0425   K 3.9 10/05/2015 1036   K 4.1 09/16/2015 0425   CL 112* 09/16/2015 0425   CO2 25 10/05/2015 1036   CO2 23 09/16/2015 0425   GLUCOSE 123 10/05/2015 1036   GLUCOSE 105* 09/16/2015 0425   BUN 14.2 10/05/2015 1036   BUN 13 09/16/2015 0425   CREATININE 1.2 10/05/2015 1036   CREATININE 1.13 09/16/2015 0425   CREATININE 1.13 07/24/2015 1155   CALCIUM 9.2 10/05/2015 1036   CALCIUM 8.2* 09/16/2015 0425   PROT 6.5 10/05/2015 1036   PROT 6.1* 09/14/2015 0440   ALBUMIN 3.7 10/05/2015 1036   ALBUMIN 3.5 09/14/2015 0440   AST 22 10/05/2015 1036   AST 33 09/14/2015 0440   ALT 34 10/05/2015 1036   ALT 37 09/14/2015 0440   ALKPHOS 94 10/05/2015 1036   ALKPHOS 47 09/14/2015 0440   BILITOT 0.45 10/05/2015 1036   BILITOT 0.4 09/14/2015 0440   GFRNONAA >60 09/16/2015 0425   GFRNONAA 71 07/24/2015 1155   GFRAA >60 09/16/2015 0425   GFRAA 82 07/24/2015 1155    No results found for: SPEP, UPEP  Lab Results  Component Value Date   WBC 4.2 10/05/2015   NEUTROABS 2.6 10/05/2015   HGB 12.6* 10/05/2015   HCT 37.2* 10/05/2015   MCV 94.5 10/05/2015   PLT 267 10/05/2015      Chemistry      Component Value Date/Time   NA 144 10/05/2015 1036   NA 142 09/16/2015 0425   K 3.9 10/05/2015 1036   K 4.1  09/16/2015 0425   CL 112* 09/16/2015 0425   CO2 25 10/05/2015 1036   CO2 23 09/16/2015 0425   BUN 14.2 10/05/2015 1036   BUN 13 09/16/2015 0425   CREATININE 1.2 10/05/2015 1036   CREATININE 1.13 09/16/2015 0425   CREATININE 1.13 07/24/2015 1155      Component Value Date/Time   CALCIUM 9.2 10/05/2015 1036   CALCIUM 8.2* 09/16/2015 0425   ALKPHOS 94 10/05/2015 1036   ALKPHOS 47 09/14/2015 0440   AST 22 10/05/2015 1036   AST 33 09/14/2015 0440   ALT 34 10/05/2015 1036   ALT 37 09/14/2015 0440   BILITOT 0.45 10/05/2015 1036   BILITOT 0.4 09/14/2015 0440     ASSESSMENT & PLAN:  Multiple myeloma not having achieved remission ([facility]) We will continue treatment today without dose adjustment. He has not obtain dental clearance yet and so I'm not prescribing Zometa. He will  continue acyclovir, calcium, vitamin D and aspirin I plan to repeat check myeloma panel next month. The total protein is improving and his bone pain is improving. We discussed the role of stem cell transplant and he is in agreement to proceed to be referred for bone marrow transplant discussion    Anemia due to antineoplastic chemotherapy This is likely due to recent treatment. The patient denies recent history of bleeding such as epistaxis, hematuria or hematochezia. He is asymptomatic from the anemia. I will observe for now.  He does not require transfusion now. I will continue the chemotherapy at current dose without dosage adjustment.  If the anemia gets progressive worse in the future, I might have to delay his treatment or adjust the chemotherapy dose.     Cancer associated pain  The patient is adamant that he does not want to take narcotic therapy. He is currently taking tramadol as needed. We'll continue the same He is on calcium and vitamin D supplement     Orders Placed This Encounter  Procedures  . Kappa/lambda light chains    Standing Status: Future     Number of Occurrences:      Standing  Expiration Date: 11/08/2016  . Multiple Myeloma Panel (SPEP&IFE w/QIG)    Standing Status: Future     Number of Occurrences:      Standing Expiration Date: 11/08/2016   All questions were answered. The patient knows to call the clinic with any problems, questions or concerns. No barriers to learning was detected. I spent 20 minutes counseling the patient face to face. The total time spent in the appointment was 25 minutes and more than 50% was on counseling and review of test results     [facility], Lekia Nier, MD 10/06/2015 8:08 AM

## 2015-10-06 NOTE — Assessment & Plan Note (Signed)
This is likely due to recent treatment. The patient denies recent history of bleeding such as epistaxis, hematuria or hematochezia. He is asymptomatic from the anemia. I will observe for now.  He does not require transfusion now. I will continue the chemotherapy at current dose without dosage adjustment.  If the anemia gets progressive worse in the future, I might have to delay his treatment or adjust the chemotherapy dose.  

## 2015-10-06 NOTE — Telephone Encounter (Signed)
Spoke with Anderson Malta at [facility] BMT [phone]) Pt needs to have medicaid in order to be seen by BMT @ [place].   Spoke with patient, states he has applied for medicaid and will call us when he has received it. We will need to notify Anderson Malta at [facility] to get him scheduled

## 2015-10-06 NOTE — Patient Instructions (Addendum)
Mr. Erik Nguyen, Erik Nguyen was seen today for hypertension and hospitalization follow-up.  Diagnoses and all orders for this visit:  Healthcare maintenance -     HgB A1c  Essential hypertension -     lisinopril-hydrochlorothiazide (PRINZIDE,ZESTORETIC) 10-12.5 MG tablet; Take 1 tablet by mouth daily. Reported on 10/06/2015   F/u in 3 months for HTN  Dr. Adrian Blackwater

## 2015-10-06 NOTE — Assessment & Plan Note (Addendum)
A: HTN, well controlled Med: compliant with prinzide. Not taking norvasc 5 mg daily.  P: Continue prinzide 10-12.5 mg daily

## 2015-10-07 MED FILL — ACYCLOVIR 400 MG TABLET: 400 | 30 days supply | Qty: 60 | Fill #1

## 2015-10-07 MED FILL — LISINOPRIL-HCTZ 10-12.5 MG: 10-12.5 | 30 days supply | Qty: 30 | Fill #10

## 2015-10-08 ENCOUNTER — Ambulatory Visit (HOSPITAL_BASED_OUTPATIENT_CLINIC_OR_DEPARTMENT_OTHER): Payer: No Typology Code available for payment source

## 2015-10-08 VITALS — BP 131/76 | HR 73 | Temp 97.8°F | Resp 18

## 2015-10-08 DIAGNOSIS — Z5112 Encounter for antineoplastic immunotherapy: Secondary | ICD-10-CM

## 2015-10-08 DIAGNOSIS — R803 Bence Jones proteinuria: Secondary | ICD-10-CM

## 2015-10-08 DIAGNOSIS — C9000 Multiple myeloma not having achieved remission: Secondary | ICD-10-CM

## 2015-10-08 MED ORDER — PROCHLORPERAZINE MALEATE 10 MG PO TABS
10.0000 mg | ORAL_TABLET | Freq: Once | ORAL | Status: AC
  Administered 2015-10-08: 10 mg via ORAL

## 2015-10-08 MED ORDER — PROCHLORPERAZINE MALEATE 10 MG PO TABS
ORAL_TABLET | ORAL | Status: AC
  Filled 2015-10-08: qty 1

## 2015-10-08 MED ORDER — BORTEZOMIB CHEMO SQ INJECTION 3.5 MG (2.5MG/ML)
1.3000 mg/m2 | Freq: Once | INTRAMUSCULAR | Status: AC
  Administered 2015-10-08: 2.5 mg via SUBCUTANEOUS
  Filled 2015-10-08: qty 2.5

## 2015-10-08 NOTE — Patient Instructions (Signed)
[facility] Discharge Instructions for Patients Receiving Chemotherapy  Today you received the following chemotherapy agents velcade   To help prevent nausea and vomiting after your treatment, we encourage you to take your nausea medication as directed  If you develop nausea and vomiting that is not controlled by your nausea medication, call the clinic.   BELOW ARE SYMPTOMS THAT SHOULD BE REPORTED IMMEDIATELY:  *FEVER GREATER THAN 100.5 F  *CHILLS WITH OR WITHOUT FEVER  NAUSEA AND VOMITING THAT IS NOT CONTROLLED WITH YOUR NAUSEA MEDICATION  *UNUSUAL SHORTNESS OF BREATH  *UNUSUAL BRUISING OR BLEEDING  TENDERNESS IN MOUTH AND THROAT WITH OR WITHOUT PRESENCE OF ULCERS  *URINARY PROBLEMS  *BOWEL PROBLEMS  UNUSUAL RASH Items with * indicate a potential emergency and should be followed up as soon as possible.  Feel free to call the clinic you have any questions or concerns. The clinic phone number is [phone].  

## 2015-10-12 ENCOUNTER — Other Ambulatory Visit (HOSPITAL_BASED_OUTPATIENT_CLINIC_OR_DEPARTMENT_OTHER): Payer: No Typology Code available for payment source

## 2015-10-12 ENCOUNTER — Telehealth: Payer: Self-pay | Admitting: Hematology and Oncology

## 2015-10-12 ENCOUNTER — Other Ambulatory Visit: Payer: Self-pay | Admitting: Hematology and Oncology

## 2015-10-12 ENCOUNTER — Ambulatory Visit (HOSPITAL_BASED_OUTPATIENT_CLINIC_OR_DEPARTMENT_OTHER): Payer: No Typology Code available for payment source

## 2015-10-12 VITALS — BP 123/74 | HR 88 | Temp 98.0°F | Resp 18

## 2015-10-12 DIAGNOSIS — K5909 Other constipation: Secondary | ICD-10-CM

## 2015-10-12 DIAGNOSIS — R803 Bence Jones proteinuria: Secondary | ICD-10-CM

## 2015-10-12 DIAGNOSIS — Z5112 Encounter for antineoplastic immunotherapy: Secondary | ICD-10-CM

## 2015-10-12 DIAGNOSIS — C9000 Multiple myeloma not having achieved remission: Secondary | ICD-10-CM

## 2015-10-12 LAB — CBC WITH DIFFERENTIAL/PLATELET
BASO%: 0.4 % (ref 0.0–2.0)
Basophils Absolute: 0 10*3/uL (ref 0.0–0.1)
EOS%: 6 % (ref 0.0–7.0)
Eosinophils Absolute: 0.2 10*3/uL (ref 0.0–0.5)
HEMATOCRIT: 37.2 % — AB (ref 38.4–49.9)
HGB: 12.6 g/dL — ABNORMAL LOW (ref 13.0–17.1)
LYMPH%: 23.5 % (ref 14.0–49.0)
MCH: 32 pg (ref 27.2–33.4)
MCHC: 33.7 g/dL (ref 32.0–36.0)
MCV: 94.9 fL (ref 79.3–98.0)
MONO#: 0.2 10*3/uL (ref 0.1–0.9)
MONO%: 8.4 % (ref 0.0–14.0)
NEUT#: 1.7 10*3/uL (ref 1.5–6.5)
NEUT%: 61.7 % (ref 39.0–75.0)
Platelets: 134 10*3/uL — ABNORMAL LOW (ref 140–400)
RBC: 3.92 10*6/uL — ABNORMAL LOW (ref 4.20–5.82)
RDW: 14.4 % (ref 11.0–14.6)
WBC: 2.8 10*3/uL — ABNORMAL LOW (ref 4.0–10.3)
lymph#: 0.6 10*3/uL — ABNORMAL LOW (ref 0.9–3.3)

## 2015-10-12 LAB — COMPREHENSIVE METABOLIC PANEL
ALT: 73 U/L — AB (ref 0–55)
AST: 21 U/L (ref 5–34)
Albumin: 3.5 g/dL (ref 3.5–5.0)
Alkaline Phosphatase: 81 U/L (ref 40–150)
Anion Gap: 9 mEq/L (ref 3–11)
BUN: 13.7 mg/dL (ref 7.0–26.0)
CHLORIDE: 104 meq/L (ref 98–109)
CO2: 24 meq/L (ref 22–29)
CREATININE: 1 mg/dL (ref 0.7–1.3)
Calcium: 8.9 mg/dL (ref 8.4–10.4)
EGFR: >90 mL/min/{1.73_m2} (ref >90)
Glucose: 136 mg/dl (ref 70–140)
Potassium: 3.7 mEq/L (ref 3.5–5.1)
Sodium: 137 mEq/L (ref 136–145)
Total Bilirubin: 0.49 mg/dL (ref 0.20–1.20)
Total Protein: 6.2 g/dL — ABNORMAL LOW (ref 6.4–8.3)

## 2015-10-12 MED ORDER — BORTEZOMIB CHEMO SQ INJECTION 3.5 MG (2.5MG/ML)
1.3000 mg/m2 | Freq: Once | INTRAMUSCULAR | Status: AC
  Administered 2015-10-12: 2.5 mg via SUBCUTANEOUS
  Filled 2015-10-12: qty 2.5

## 2015-10-12 MED ORDER — SENNOSIDES-DOCUSATE SODIUM 8.6-50 MG PO TABS: 1.0000 | ORAL_TABLET | Freq: Two times a day (BID) | ORAL | Status: DC

## 2015-10-12 MED ORDER — PROCHLORPERAZINE MALEATE 10 MG PO TABS
10.0000 mg | ORAL_TABLET | Freq: Once | ORAL | Status: AC
  Administered 2015-10-12: 10 mg via ORAL

## 2015-10-12 MED ORDER — PROCHLORPERAZINE MALEATE 10 MG PO TABS
ORAL_TABLET | ORAL | Status: AC
  Filled 2015-10-12: qty 1

## 2015-10-12 MED FILL — DOC-Q-LAX TABLET: 8.6-50 | 48 days supply | Qty: 97 | Fill #0

## 2015-10-12 NOTE — Patient Instructions (Signed)
[facility] Discharge Instructions for Patients Receiving Chemotherapy  Today you received the following chemotherapy agents Velcade.  To help prevent nausea and vomiting after your treatment, we encourage you to take your nausea medication as directed.    If you develop nausea and vomiting that is not controlled by your nausea medication, call the clinic.   BELOW ARE SYMPTOMS THAT SHOULD BE REPORTED IMMEDIATELY:  *FEVER GREATER THAN 100.5 F  *CHILLS WITH OR WITHOUT FEVER  NAUSEA AND VOMITING THAT IS NOT CONTROLLED WITH YOUR NAUSEA MEDICATION  *UNUSUAL SHORTNESS OF BREATH  *UNUSUAL BRUISING OR BLEEDING  TENDERNESS IN MOUTH AND THROAT WITH OR WITHOUT PRESENCE OF ULCERS  *URINARY PROBLEMS  *BOWEL PROBLEMS  UNUSUAL RASH Items with * indicate a potential emergency and should be followed up as soon as possible.  Feel free to call the clinic you have any questions or concerns. The clinic phone number is [phone].    

## 2015-10-12 NOTE — Telephone Encounter (Signed)
Saw the patient briefly. He complained of pain under the right arm. Examination revealed mild tenderness under right forearm; suspect musculoskeletal tenderness. Recommend conservative management only

## 2015-10-13 LAB — KAPPA/LAMBDA LIGHT CHAINS
Ig Kappa Free Light Chain: 9.03 mg/L (ref 3.30–19.40)
Kappa/Lambda FluidC Ratio: 0.01 — ABNORMAL LOW (ref 0.26–1.65)

## 2015-10-13 LAB — MULTIPLE MYELOMA PANEL, SERUM
ALBUMIN/GLOB SERPL: 1.8 — AB (ref 0.7–1.7)
Albumin SerPl Elph-Mcnc: 3.6 g/dL (ref 2.9–4.4)
Alpha 1: 0.2 g/dL (ref 0.0–0.4)
Alpha2 Glob SerPl Elph-Mcnc: 0.6 g/dL (ref 0.4–1.0)
B-Globulin SerPl Elph-Mcnc: 0.9 g/dL (ref 0.7–1.3)
GAMMA GLOB SERPL ELPH-MCNC: 0.4 g/dL (ref 0.4–1.8)
GLOBULIN, TOTAL: 2.1 g/dL — AB (ref 2.2–3.9)
IGA/IMMUNOGLOBULIN A, SERUM: 21 mg/dL — AB (ref 90–386)
IgG, Qn, Serum: 486 mg/dL — ABNORMAL LOW (ref 700–1600)
IgM, Qn, Serum: <5 mg/dL — ABNORMAL LOW (ref 20–172)
Total Protein: 5.7 g/dL — ABNORMAL LOW (ref 6.0–8.5)

## 2015-10-15 ENCOUNTER — Ambulatory Visit (HOSPITAL_BASED_OUTPATIENT_CLINIC_OR_DEPARTMENT_OTHER): Payer: Medicaid Other

## 2015-10-15 VITALS — BP 125/84 | HR 90 | Temp 98.3°F | Resp 18

## 2015-10-15 DIAGNOSIS — C9000 Multiple myeloma not having achieved remission: Secondary | ICD-10-CM

## 2015-10-15 DIAGNOSIS — R803 Bence Jones proteinuria: Secondary | ICD-10-CM

## 2015-10-15 DIAGNOSIS — Z5112 Encounter for antineoplastic immunotherapy: Secondary | ICD-10-CM | POA: Diagnosis not present

## 2015-10-15 MED ORDER — PROCHLORPERAZINE MALEATE 10 MG PO TABS
10.0000 mg | ORAL_TABLET | Freq: Once | ORAL | Status: AC
  Administered 2015-10-15: 10 mg via ORAL

## 2015-10-15 MED ORDER — TRAMADOL HCL 50 MG PO TABS: ORAL_TABLET | ORAL | Status: DC

## 2015-10-15 MED ORDER — BORTEZOMIB CHEMO SQ INJECTION 3.5 MG (2.5MG/ML)
1.3000 mg/m2 | Freq: Once | INTRAMUSCULAR | Status: AC
  Administered 2015-10-15: 2.5 mg via SUBCUTANEOUS
  Filled 2015-10-15: qty 2.5

## 2015-10-15 MED ORDER — PROCHLORPERAZINE MALEATE 10 MG PO TABS
ORAL_TABLET | ORAL | Status: AC
  Filled 2015-10-15: qty 1

## 2015-10-15 MED FILL — traMADol HCL 50 MG TABS: 50 | 11 days supply | Qty: 90 | Fill #0

## 2015-10-15 NOTE — Patient Instructions (Signed)
[facility] Discharge Instructions for Patients Receiving Chemotherapy  Today you received the following chemotherapy agents Velcade  To help prevent nausea and vomiting after your treatment, we encourage you to take your nausea medication    If you develop nausea and vomiting that is not controlled by your nausea medication, call the clinic.   BELOW ARE SYMPTOMS THAT SHOULD BE REPORTED IMMEDIATELY:  *FEVER GREATER THAN 100.5 F  *CHILLS WITH OR WITHOUT FEVER  NAUSEA AND VOMITING THAT IS NOT CONTROLLED WITH YOUR NAUSEA MEDICATION  *UNUSUAL SHORTNESS OF BREATH  *UNUSUAL BRUISING OR BLEEDING  TENDERNESS IN MOUTH AND THROAT WITH OR WITHOUT PRESENCE OF ULCERS  *URINARY PROBLEMS  *BOWEL PROBLEMS  UNUSUAL RASH Items with * indicate a potential emergency and should be followed up as soon as possible.  Feel free to call the clinic you have any questions or concerns. The clinic phone number is [phone].  Please show the CHEMO ALERT CARD at check-in to the Emergency Department and triage nurse.   

## 2015-10-16 ENCOUNTER — Ambulatory Visit: Payer: No Typology Code available for payment source | Attending: Family Medicine

## 2015-10-19 ENCOUNTER — Encounter (HOSPITAL_COMMUNITY): Payer: Self-pay

## 2015-10-26 ENCOUNTER — Telehealth: Payer: Self-pay | Admitting: Pharmacist

## 2015-10-26 ENCOUNTER — Other Ambulatory Visit (HOSPITAL_BASED_OUTPATIENT_CLINIC_OR_DEPARTMENT_OTHER): Payer: No Typology Code available for payment source

## 2015-10-26 ENCOUNTER — Encounter: Payer: Self-pay | Admitting: Hematology and Oncology

## 2015-10-26 ENCOUNTER — Other Ambulatory Visit: Payer: Self-pay | Admitting: *Deleted

## 2015-10-26 ENCOUNTER — Ambulatory Visit (HOSPITAL_BASED_OUTPATIENT_CLINIC_OR_DEPARTMENT_OTHER): Payer: No Typology Code available for payment source

## 2015-10-26 ENCOUNTER — Telehealth: Payer: Self-pay | Admitting: Hematology and Oncology

## 2015-10-26 ENCOUNTER — Ambulatory Visit (HOSPITAL_BASED_OUTPATIENT_CLINIC_OR_DEPARTMENT_OTHER): Payer: No Typology Code available for payment source | Admitting: Hematology and Oncology

## 2015-10-26 VITALS — BP 119/78 | HR 87 | Temp 97.8°F | Resp 18 | Ht 66.0 in | Wt 167.8 lb

## 2015-10-26 DIAGNOSIS — C9001 Multiple myeloma in remission: Secondary | ICD-10-CM

## 2015-10-26 DIAGNOSIS — S42001D Fracture of unspecified part of right clavicle, subsequent encounter for fracture with routine healing: Secondary | ICD-10-CM

## 2015-10-26 DIAGNOSIS — G893 Neoplasm related pain (acute) (chronic): Secondary | ICD-10-CM

## 2015-10-26 DIAGNOSIS — C9000 Multiple myeloma not having achieved remission: Secondary | ICD-10-CM

## 2015-10-26 DIAGNOSIS — K209 Esophagitis, unspecified without bleeding: Secondary | ICD-10-CM

## 2015-10-26 DIAGNOSIS — R803 Bence Jones proteinuria: Secondary | ICD-10-CM

## 2015-10-26 DIAGNOSIS — Z5112 Encounter for antineoplastic immunotherapy: Secondary | ICD-10-CM

## 2015-10-26 LAB — COMPREHENSIVE METABOLIC PANEL
ALK PHOS: 103 U/L (ref 40–150)
ALT: 53 U/L (ref 0–55)
AST: 32 U/L (ref 5–34)
Albumin: 3.4 g/dL — ABNORMAL LOW (ref 3.5–5.0)
Anion Gap: 6 mEq/L (ref 3–11)
BILIRUBIN TOTAL: 0.41 mg/dL (ref 0.20–1.20)
BUN: 10.7 mg/dL (ref 7.0–26.0)
CALCIUM: 8.9 mg/dL (ref 8.4–10.4)
CO2: 27 mEq/L (ref 22–29)
CREATININE: 1.2 mg/dL (ref 0.7–1.3)
Chloride: 111 mEq/L — ABNORMAL HIGH (ref 98–109)
EGFR: 79 mL/min/{1.73_m2} — ABNORMAL LOW (ref >90)
GLUCOSE: 110 mg/dL (ref 70–140)
Potassium: 3.8 mEq/L (ref 3.5–5.1)
SODIUM: 144 meq/L (ref 136–145)
TOTAL PROTEIN: 6.2 g/dL — AB (ref 6.4–8.3)

## 2015-10-26 LAB — CBC WITH DIFFERENTIAL/PLATELET
BASO%: 1.7 % (ref 0.0–2.0)
BASOS ABS: 0.1 10*3/uL (ref 0.0–0.1)
EOS ABS: 0.2 10*3/uL (ref 0.0–0.5)
EOS%: 3.5 % (ref 0.0–7.0)
HEMATOCRIT: 39.4 % (ref 38.4–49.9)
HEMOGLOBIN: 13.2 g/dL (ref 13.0–17.1)
LYMPH#: 2.6 10*3/uL (ref 0.9–3.3)
LYMPH%: 51.1 % — ABNORMAL HIGH (ref 14.0–49.0)
MCH: 32.3 pg (ref 27.2–33.4)
MCHC: 33.4 g/dL (ref 32.0–36.0)
MCV: 96.7 fL (ref 79.3–98.0)
MONO#: 0.2 10*3/uL (ref 0.1–0.9)
MONO%: 3.9 % (ref 0.0–14.0)
NEUT#: 2 10*3/uL (ref 1.5–6.5)
NEUT%: 39.8 % (ref 39.0–75.0)
PLATELETS: 331 10*3/uL (ref 140–400)
RBC: 4.07 10*6/uL — ABNORMAL LOW (ref 4.20–5.82)
RDW: 14.9 % — AB (ref 11.0–14.6)
WBC: 5.1 10*3/uL (ref 4.0–10.3)

## 2015-10-26 MED ORDER — LENALIDOMIDE 10 MG PO CAPS: 10.0000 mg | ORAL_CAPSULE | Freq: Every day | ORAL | Status: DC

## 2015-10-26 MED ORDER — PROCHLORPERAZINE MALEATE 10 MG PO TABS
ORAL_TABLET | ORAL | Status: AC
  Filled 2015-10-26: qty 1

## 2015-10-26 MED ORDER — PANTOPRAZOLE SODIUM 40 MG PO TBEC: 40.0000 mg | DELAYED_RELEASE_TABLET | Freq: Every day | ORAL | Status: DC

## 2015-10-26 MED ORDER — BORTEZOMIB CHEMO SQ INJECTION 3.5 MG (2.5MG/ML)
1.3000 mg/m2 | Freq: Once | INTRAMUSCULAR | Status: AC
  Administered 2015-10-26: 2.5 mg via SUBCUTANEOUS
  Filled 2015-10-26: qty 2.5

## 2015-10-26 MED ORDER — PROCHLORPERAZINE MALEATE 10 MG PO TABS
10.0000 mg | ORAL_TABLET | Freq: Once | ORAL | Status: AC
  Administered 2015-10-26: 10 mg via ORAL

## 2015-10-26 MED ORDER — DEXAMETHASONE 4 MG PO TABS: ORAL_TABLET | ORAL | Status: DC

## 2015-10-26 MED ORDER — DICLOFENAC SODIUM 1 % TD GEL: 2.0000 g | Freq: Four times a day (QID) | TRANSDERMAL | Status: DC

## 2015-10-26 MED FILL — DEXAMETHASONE 4 MG TABLET: 4 | 42 days supply | Qty: 60 | Fill #0

## 2015-10-26 MED FILL — DICLOFENAC SODIUM 1% GEL: 1 | 12 days supply | Qty: 100 | Fill #0

## 2015-10-26 MED FILL — PANTOPRAZOLE SOD DR 40 MG T: 40 | 30 days supply | Qty: 30 | Fill #0

## 2015-10-26 NOTE — Patient Instructions (Signed)
[facility] Discharge Instructions for Patients Receiving Chemotherapy  Today you received the following chemotherapy agents Velcade  To help prevent nausea and vomiting after your treatment, we encourage you to take your nausea medication    If you develop nausea and vomiting that is not controlled by your nausea medication, call the clinic.   BELOW ARE SYMPTOMS THAT SHOULD BE REPORTED IMMEDIATELY:  *FEVER GREATER THAN 100.5 F  *CHILLS WITH OR WITHOUT FEVER  NAUSEA AND VOMITING THAT IS NOT CONTROLLED WITH YOUR NAUSEA MEDICATION  *UNUSUAL SHORTNESS OF BREATH  *UNUSUAL BRUISING OR BLEEDING  TENDERNESS IN MOUTH AND THROAT WITH OR WITHOUT PRESENCE OF ULCERS  *URINARY PROBLEMS  *BOWEL PROBLEMS  UNUSUAL RASH Items with * indicate a potential emergency and should be followed up as soon as possible.  Feel free to call the clinic you have any questions or concerns. The clinic phone number is [phone].  Please show the CHEMO ALERT CARD at check-in to the Emergency Department and triage nurse.   

## 2015-10-26 NOTE — Progress Notes (Signed)
Pt tolerated velcade shot on LLQ. Pt stable upon discharge. Refused avs at this time.

## 2015-10-26 NOTE — Progress Notes (Signed)
[place] OFFICE PROGRESS NOTE  Patient Care Team: Boykin Nearing, MD as PCP - General (Family Medicine)  SUMMARY OF ONCOLOGIC HISTORY:   Multiple myeloma in remission ([place])   08/28/2015 Bone Marrow Biopsy Accession: [id number] biopsy showed 58% myeloma involvement. Cytogenetics was normal and FISH showed Del 13q and t(11;14).    09/07/2015 -  Chemotherapy he was started on combined treatment of Velcade and Revlimid with Dex   09/12/2015 - 09/16/2015 Hospital Admission He was admitted to the hospital with sepsis, treatment was placed on hold and he received one dose of GCSF for leukopenia    INTERVAL HISTORY: Please see below for problem oriented charting. He returns for cycle 3 of chemotherapy. His pain is improving. Denies nausea or vomiting. No peripheral neuropathy, recent infection.  REVIEW OF SYSTEMS:   Constitutional: Denies fevers, chills or abnormal weight loss Eyes: Denies blurriness of vision Ears, nose, mouth, throat, and face: Denies mucositis or sore throat Respiratory: Denies cough, dyspnea or wheezes Cardiovascular: Denies palpitation, chest discomfort or lower extremity swelling Gastrointestinal:  Denies nausea, heartburn or change in bowel habits Skin: Denies abnormal skin rashes Lymphatics: Denies new lymphadenopathy or easy bruising Neurological:Denies numbness, tingling or new weaknesses Behavioral/Psych: Mood is stable, no new changes  All other systems were reviewed with the patient and are negative.  I have reviewed the past medical history, past surgical history, social history and family history with the patient and they are unchanged from previous note.  ALLERGIES:  is allergic to latex.  MEDICATIONS:  Current Outpatient Prescriptions  Medication Sig Dispense Refill  . acyclovir (ZOVIRAX) 400 MG tablet Take 1 tablet (400 mg total) by mouth 2 (two) times daily. 60 tablet 3  . aspirin EC 81 MG tablet Take 81 mg by mouth daily.    .  cholecalciferol (VITAMIN D) 1000 units tablet Take 2,000 Units by mouth daily.    Marland Kitchen dexamethasone (DECADRON) 4 MG tablet Take 5 tablets (20 mg) weekly on Mondays and Thursdays 60 tablet 3  . diclofenac sodium (VOLTAREN) 1 % GEL Apply 2 g topically 4 (four) times daily. 100 g 2  . lenalidomide (REVLIMID) 10 MG capsule Take 1 capsule (10 mg total) by mouth daily. 14 capsule 0  . lisinopril-hydrochlorothiazide (PRINZIDE,ZESTORETIC) 10-12.5 MG tablet Take 1 tablet by mouth daily. Reported on 10/06/2015 30 tablet 5  . ondansetron (ZOFRAN) 8 MG tablet Take 1 tablet (8 mg total) by mouth every 8 (eight) hours as needed (Nausea or vomiting). 30 tablet 1  . pantoprazole (PROTONIX) 40 MG tablet Take 1 tablet (40 mg total) by mouth daily. 30 tablet 0  . polyethylene glycol (MIRALAX / GLYCOLAX) packet Take 17 g by mouth 2 (two) times daily. 14 each 0  . prochlorperazine (COMPAZINE) 10 MG tablet Take 1 tablet (10 mg total) by mouth every 6 (six) hours as needed (Nausea or vomiting). 30 tablet 1  . senna-docusate (SENOKOT-S) 8.6-50 MG tablet Take 1 tablet by mouth 2 (two) times daily. 60 tablet 3  . traMADol (ULTRAM) 50 MG tablet Take one to two tablets every 6 hrs as needed for pain. 90 tablet 0   No current facility-administered medications for this visit.    PHYSICAL EXAMINATION: ECOG PERFORMANCE STATUS: 1 - Symptomatic but completely ambulatory  Filed Vitals:   10/26/15 1013  BP: 119/78  Pulse: 87  Temp: 97.8 F (36.6 C)  Resp: 18   Filed Weights   10/26/15 1013  Weight: 167 lb 12.8 oz (76.114 kg)  GENERAL:alert, no distress and comfortable SKIN: skin color, texture, turgor are normal, no rashes or significant lesions EYES: normal, Conjunctiva are pink and non-injected, sclera clear Musculoskeletal:no cyanosis of digits and no clubbing  NEURO: alert & oriented x 3 with fluent speech, no focal motor/sensory deficits  LABORATORY DATA:  I have reviewed the data as listed    Component  Value Date/Time   NA 144 10/26/2015 1004   NA 142 09/16/2015 0425   K 3.8 10/26/2015 1004   K 4.1 09/16/2015 0425   CL 112* 09/16/2015 0425   CO2 27 10/26/2015 1004   CO2 23 09/16/2015 0425   GLUCOSE 110 10/26/2015 1004   GLUCOSE 105* 09/16/2015 0425   BUN 10.7 10/26/2015 1004   BUN 13 09/16/2015 0425   CREATININE 1.2 10/26/2015 1004   CREATININE 1.13 09/16/2015 0425   CREATININE 1.13 07/24/2015 1155   CALCIUM 8.9 10/26/2015 1004   CALCIUM 8.2* 09/16/2015 0425   PROT 6.2* 10/26/2015 1004   PROT 5.7* 10/12/2015 0801   PROT 6.1* 09/14/2015 0440   ALBUMIN 3.4* 10/26/2015 1004   ALBUMIN 3.5 09/14/2015 0440   AST 32 10/26/2015 1004   AST 33 09/14/2015 0440   ALT 53 10/26/2015 1004   ALT 37 09/14/2015 0440   ALKPHOS 103 10/26/2015 1004   ALKPHOS 47 09/14/2015 0440   BILITOT 0.41 10/26/2015 1004   BILITOT 0.4 09/14/2015 0440   GFRNONAA >60 09/16/2015 0425   GFRNONAA 71 07/24/2015 1155   GFRAA >60 09/16/2015 0425   GFRAA 82 07/24/2015 1155    No results found for: SPEP, UPEP  Lab Results  Component Value Date   WBC 5.1 10/26/2015   NEUTROABS 2.0 10/26/2015   HGB 13.2 10/26/2015   HCT 39.4 10/26/2015   MCV 96.7 10/26/2015   PLT 331 10/26/2015      Chemistry      Component Value Date/Time   NA 144 10/26/2015 1004   NA 142 09/16/2015 0425   K 3.8 10/26/2015 1004   K 4.1 09/16/2015 0425   CL 112* 09/16/2015 0425   CO2 27 10/26/2015 1004   CO2 23 09/16/2015 0425   BUN 10.7 10/26/2015 1004   BUN 13 09/16/2015 0425   CREATININE 1.2 10/26/2015 1004   CREATININE 1.13 09/16/2015 0425   CREATININE 1.13 07/24/2015 1155      Component Value Date/Time   CALCIUM 8.9 10/26/2015 1004   CALCIUM 8.2* 09/16/2015 0425   ALKPHOS 103 10/26/2015 1004   ALKPHOS 47 09/14/2015 0440   AST 32 10/26/2015 1004   AST 33 09/14/2015 0440   ALT 53 10/26/2015 1004   ALT 37 09/14/2015 0440   BILITOT 0.41 10/26/2015 1004   BILITOT 0.4 09/14/2015 0440      ASSESSMENT & PLAN:  Multiple  myeloma in remission ([place]) He is tolerating treatment well. Recent blood work show 90% drop in serum light chain. Overall, his pain control is improving. I recommend he return back to work as tolerated. We are waiting for insurance approval so that he can be referred for discussion regarding the role of stem cell transplant. I plan to see him back next month for further review and will start to initiate dexamethasone taper  Cancer associated pain  The patient is adamant that he does not want to take narcotic therapy. He is currently taking tramadol as needed. We'll continue the same He is on calcium and vitamin D supplement    Orders Placed This Encounter  Procedures  . Kappa/lambda light chains  Standing Status: Future     Number of Occurrences:      Standing Expiration Date: 11/29/2016  . Multiple Myeloma Panel (SPEP&IFE w/QIG)    Standing Status: Future     Number of Occurrences:      Standing Expiration Date: 11/29/2016   All questions were answered. The patient knows to call the clinic with any problems, questions or concerns. No barriers to learning was detected. I spent 15 minutes counseling the patient face to face. The total time spent in the appointment was 20 minutes and more than 50% was on counseling and review of test results     [facility], [place], MD 10/26/2015 11:53 AM

## 2015-10-26 NOTE — Assessment & Plan Note (Signed)
He is tolerating treatment well. Recent blood work show 90% drop in serum light chain. Overall, his pain control is improving. I recommend he return back to work as tolerated. We are waiting for insurance approval so that he can be referred for discussion regarding the role of stem cell transplant. I plan to see him back next month for further review and will start to initiate dexamethasone taper

## 2015-10-26 NOTE — Progress Notes (Signed)
Left mess to patient to bring in proof of income when he comes in on Thursday. velcade asst left message they need.

## 2015-10-26 NOTE — Assessment & Plan Note (Signed)
The patient is adamant that he does not want to take narcotic therapy. He is currently taking tramadol as needed. We'll continue the same He is on calcium and vitamin D supplement

## 2015-10-26 NOTE — Telephone Encounter (Signed)
10/26/15: Attempted to reach patient for follow up on oral medication: Revlimid/Velcade. No answer. Left VM for patient to call back with any questions or issues.   Thank you,  Montel Clock, PharmD, [facility] [phone]

## 2015-10-26 NOTE — Telephone Encounter (Signed)
Gave and printed appt sched and avs for pt for March and April °

## 2015-10-26 NOTE — Progress Notes (Signed)
Faxed velcade application [id number]

## 2015-10-29 ENCOUNTER — Ambulatory Visit (HOSPITAL_BASED_OUTPATIENT_CLINIC_OR_DEPARTMENT_OTHER): Payer: No Typology Code available for payment source

## 2015-10-29 ENCOUNTER — Telehealth: Payer: Self-pay | Admitting: Family Medicine

## 2015-10-29 VITALS — BP 130/79 | HR 89 | Temp 98.2°F | Resp 18 | Ht 66.0 in

## 2015-10-29 DIAGNOSIS — R803 Bence Jones proteinuria: Secondary | ICD-10-CM

## 2015-10-29 DIAGNOSIS — C9000 Multiple myeloma not having achieved remission: Secondary | ICD-10-CM

## 2015-10-29 DIAGNOSIS — Z5112 Encounter for antineoplastic immunotherapy: Secondary | ICD-10-CM

## 2015-10-29 MED ORDER — BORTEZOMIB CHEMO SQ INJECTION 3.5 MG (2.5MG/ML)
1.3000 mg/m2 | Freq: Once | INTRAMUSCULAR | Status: AC
  Administered 2015-10-29: 2.5 mg via SUBCUTANEOUS
  Filled 2015-10-29: qty 2.5

## 2015-10-29 MED ORDER — PROCHLORPERAZINE MALEATE 10 MG PO TABS
ORAL_TABLET | ORAL | Status: AC
  Filled 2015-10-29: qty 1

## 2015-10-29 MED ORDER — PROCHLORPERAZINE MALEATE 10 MG PO TABS
10.0000 mg | ORAL_TABLET | Freq: Once | ORAL | Status: AC
  Administered 2015-10-29: 10 mg via ORAL

## 2015-10-29 MED FILL — AMOXICILLIN 500 MG CAPSULE: 500 | 7 days supply | Qty: 21 | Fill #0

## 2015-10-29 NOTE — Telephone Encounter (Signed)
Provider Dr. Gloriann Loan needs to discuss Mr. Erik Nguyen dental treatment and how this might affect his medical treatment....Marland KitchenMarland Kitchenplease follow up

## 2015-10-29 NOTE — Patient Instructions (Signed)
[facility] Discharge Instructions for Patients Receiving Chemotherapy  Today you received the following chemotherapy agents Velcade  To help prevent nausea and vomiting after your treatment, we encourage you to take your nausea medication    If you develop nausea and vomiting that is not controlled by your nausea medication, call the clinic.   BELOW ARE SYMPTOMS THAT SHOULD BE REPORTED IMMEDIATELY:  *FEVER GREATER THAN 100.5 F  *CHILLS WITH OR WITHOUT FEVER  NAUSEA AND VOMITING THAT IS NOT CONTROLLED WITH YOUR NAUSEA MEDICATION  *UNUSUAL SHORTNESS OF BREATH  *UNUSUAL BRUISING OR BLEEDING  TENDERNESS IN MOUTH AND THROAT WITH OR WITHOUT PRESENCE OF ULCERS  *URINARY PROBLEMS  *BOWEL PROBLEMS  UNUSUAL RASH Items with * indicate a potential emergency and should be followed up as soon as possible.  Feel free to call the clinic you have any questions or concerns. The clinic phone number is [phone].  Please show the CHEMO ALERT CARD at check-in to the Emergency Department and triage nurse.   

## 2015-11-02 ENCOUNTER — Other Ambulatory Visit: Payer: Self-pay | Admitting: Hematology and Oncology

## 2015-11-02 ENCOUNTER — Other Ambulatory Visit (HOSPITAL_BASED_OUTPATIENT_CLINIC_OR_DEPARTMENT_OTHER): Payer: No Typology Code available for payment source

## 2015-11-02 ENCOUNTER — Ambulatory Visit (HOSPITAL_BASED_OUTPATIENT_CLINIC_OR_DEPARTMENT_OTHER): Payer: No Typology Code available for payment source

## 2015-11-02 VITALS — BP 136/90 | HR 91 | Temp 98.8°F | Resp 18

## 2015-11-02 DIAGNOSIS — Z5112 Encounter for antineoplastic immunotherapy: Secondary | ICD-10-CM

## 2015-11-02 DIAGNOSIS — C9000 Multiple myeloma not having achieved remission: Secondary | ICD-10-CM

## 2015-11-02 DIAGNOSIS — R803 Bence Jones proteinuria: Secondary | ICD-10-CM

## 2015-11-02 LAB — CBC WITH DIFFERENTIAL/PLATELET
BASO%: 0.5 % (ref 0.0–2.0)
Basophils Absolute: 0 10*3/uL (ref 0.0–0.1)
EOS%: 1.8 % (ref 0.0–7.0)
Eosinophils Absolute: 0.1 10*3/uL (ref 0.0–0.5)
HEMATOCRIT: 40 % (ref 38.4–49.9)
HEMOGLOBIN: 13.4 g/dL (ref 13.0–17.1)
LYMPH#: 1.4 10*3/uL (ref 0.9–3.3)
LYMPH%: 33.9 % (ref 14.0–49.0)
MCH: 32 pg (ref 27.2–33.4)
MCHC: 33.3 g/dL (ref 32.0–36.0)
MCV: 96.1 fL (ref 79.3–98.0)
MONO#: 0.2 10*3/uL (ref 0.1–0.9)
MONO%: 3.6 % (ref 0.0–14.0)
NEUT%: 60.2 % (ref 39.0–75.0)
NEUTROS ABS: 2.6 10*3/uL (ref 1.5–6.5)
PLATELETS: 224 10*3/uL (ref 140–400)
RBC: 4.17 10*6/uL — ABNORMAL LOW (ref 4.20–5.82)
RDW: 14.5 % (ref 11.0–14.6)
WBC: 4.3 10*3/uL (ref 4.0–10.3)

## 2015-11-02 LAB — COMPREHENSIVE METABOLIC PANEL
ALBUMIN: 3.5 g/dL (ref 3.5–5.0)
ALT: 34 U/L (ref 0–55)
AST: 20 U/L (ref 5–34)
Alkaline Phosphatase: 102 U/L (ref 40–150)
Anion Gap: 6 mEq/L (ref 3–11)
BILIRUBIN TOTAL: 0.34 mg/dL (ref 0.20–1.20)
BUN: 16 mg/dL (ref 7.0–26.0)
CALCIUM: 8.8 mg/dL (ref 8.4–10.4)
CHLORIDE: 105 meq/L (ref 98–109)
CO2: 29 mEq/L (ref 22–29)
CREATININE: 1 mg/dL (ref 0.7–1.3)
EGFR: >90 mL/min/{1.73_m2} (ref >90)
Glucose: 144 mg/dl — ABNORMAL HIGH (ref 70–140)
Potassium: 3.9 mEq/L (ref 3.5–5.1)
Sodium: 140 mEq/L (ref 136–145)
TOTAL PROTEIN: 6.4 g/dL (ref 6.4–8.3)

## 2015-11-02 LAB — TECHNOLOGIST REVIEW

## 2015-11-02 MED ORDER — PROCHLORPERAZINE MALEATE 10 MG PO TABS
10.0000 mg | ORAL_TABLET | Freq: Once | ORAL | Status: AC
  Administered 2015-11-02: 10 mg via ORAL

## 2015-11-02 MED ORDER — BORTEZOMIB CHEMO SQ INJECTION 3.5 MG (2.5MG/ML)
1.3000 mg/m2 | Freq: Once | INTRAMUSCULAR | Status: AC
  Administered 2015-11-02: 2.5 mg via SUBCUTANEOUS
  Filled 2015-11-02: qty 2.5

## 2015-11-02 MED ORDER — PROCHLORPERAZINE MALEATE 10 MG PO TABS
ORAL_TABLET | ORAL | Status: AC
  Filled 2015-11-02: qty 1

## 2015-11-02 NOTE — Telephone Encounter (Signed)
Called back to Dr. Gloriann Loan, patient's dentist.   Mr. 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 He will need extraction of 3 teeth. Teeth are in separate quadrants so no stitches are needed.  Dr. Purvis Sheffield question is will he need additional precautions related to his extractions give his history of multiple myeloma?   He has been started on amox 500 mg, 10 day regimen.  Patient has multiple myeloma that is in remission.  I reviewed his last CBC, platelets and Hgb wnl from 3.13.2017.   I do not believe any other precautions are needed, and have told Dr. Gloriann Loan this. However,  I will route this note along to Dr. Alvy Bimler with request that he call Dr. Gloriann Loan and  add any additional recommendations to this phone note if he has any.  Thanks,  Dr. Adrian Blackwater

## 2015-11-02 NOTE — Patient Instructions (Signed)
Bortezomib injection What is this medicine? BORTEZOMIB (bor TEZ oh mib) is a medicine that targets proteins in cancer cells and stops the cancer cells from growing. It is used to treat multiple myeloma and mantle-cell lymphoma. This medicine may be used for other purposes; ask your health care provider or pharmacist if you have questions. What should I tell my health care provider before I take this medicine? They need to know if you have any of these conditions: -diabetes -heart disease -irregular heartbeat -liver disease -on hemodialysis -low blood counts, like low white blood cells, platelets, or hemoglobin -peripheral neuropathy -taking medicine for blood pressure -an unusual or allergic reaction to bortezomib, mannitol, boron, other medicines, foods, dyes, or preservatives -pregnant or trying to get pregnant -breast-feeding How should I use this medicine? This medicine is for injection into a vein or for injection under the skin. It is given by a health care professional in a hospital or clinic setting. Talk to your pediatrician regarding the use of this medicine in children. Special care may be needed. Overdosage: If you think you have taken too much of this medicine contact a poison control center or emergency room at once. NOTE: This medicine is only for you. Do not share this medicine with others. What if I miss a dose? It is important not to miss your dose. Call your doctor or health care professional if you are unable to keep an appointment. What may interact with this medicine? This medicine may interact with the following medications: -ketoconazole -rifampin -ritonavir -St. John's Wort This list may not describe all possible interactions. Give your health care provider a list of all the medicines, herbs, non-prescription drugs, or dietary supplements you use. Also tell them if you smoke, drink alcohol, or use illegal drugs. Some items may interact with your medicine. What  should I watch for while using this medicine? Visit your doctor for checks on your progress. This drug may make you feel generally unwell. This is not uncommon, as chemotherapy can affect healthy cells as well as cancer cells. Report any side effects. Continue your course of treatment even though you feel ill unless your doctor tells you to stop. You may get drowsy or dizzy. Do not drive, use machinery, or do anything that needs mental alertness until you know how this medicine affects you. Do not stand or sit up quickly, especially if you are an older patient. This reduces the risk of dizzy or fainting spells. In some cases, you may be given additional medicines to help with side effects. Follow all directions for their use. Call your doctor or health care professional for advice if you get a fever, chills or sore throat, or other symptoms of a cold or flu. Do not treat yourself. This drug decreases your body's ability to fight infections. Try to avoid being around people who are sick. This medicine may increase your risk to bruise or bleed. Call your doctor or health care professional if you notice any unusual bleeding. You may need blood work done while you are taking this medicine. In some patients, this medicine may cause a serious brain infection that may cause death. If you have any problems seeing, thinking, speaking, walking, or standing, tell your doctor right away. If you cannot reach your doctor, urgently seek other source of medical care. Do not become pregnant while taking this medicine. Women should inform their doctor if they wish to become pregnant or think they might be pregnant. There is a potential for serious  side effects to an unborn child. Talk to your health care professional or pharmacist for more information. Do not breast-feed an infant while taking this medicine. Check with your doctor or health care professional if you get an attack of severe diarrhea, nausea and vomiting, or if  you sweat a lot. The loss of too much body fluid can make it dangerous for you to take this medicine. What side effects may I notice from receiving this medicine? Side effects that you should report to your doctor or health care professional as soon as possible: -allergic reactions like skin rash, itching or hives, swelling of the face, lips, or tongue -breathing problems -changes in hearing -changes in vision -fast, irregular heartbeat -feeling faint or lightheaded, falls -pain, tingling, numbness in the hands or feet -right upper belly pain -seizures -swelling of the ankles, feet, hands -unusual bleeding or bruising -unusually weak or tired -vomiting -yellowing of the eyes or skin Side effects that usually do not require medical attention (report to your doctor or health care professional if they continue or are bothersome): -changes in emotions or moods -constipation -diarrhea -loss of appetite -headache -irritation at site where injected -nausea This list may not describe all possible side effects. Call your doctor for medical advice about side effects. You may report side effects to FDA at 1-800-FDA-1088. Where should I keep my medicine? This drug is given in a hospital or clinic and will not be stored at home. NOTE: This sheet is a summary. It may not cover all possible information. If you have questions about this medicine, talk to your doctor, pharmacist, or health care provider.    2016, Elsevier/Gold Standard. (2014-09-30 14:47:04)

## 2015-11-02 NOTE — Telephone Encounter (Signed)
Called back to Dr. Gloriann Loan to pass on recommendation to have extractions done on revlimid off weeks.

## 2015-11-02 NOTE — Telephone Encounter (Signed)
I recommend he proceed with extraction next week since it is his week off treatment No extra precaution needed

## 2015-11-05 ENCOUNTER — Ambulatory Visit (HOSPITAL_BASED_OUTPATIENT_CLINIC_OR_DEPARTMENT_OTHER): Payer: No Typology Code available for payment source

## 2015-11-05 VITALS — BP 131/91 | HR 82 | Temp 98.4°F

## 2015-11-05 DIAGNOSIS — Z5112 Encounter for antineoplastic immunotherapy: Secondary | ICD-10-CM

## 2015-11-05 DIAGNOSIS — R803 Bence Jones proteinuria: Secondary | ICD-10-CM

## 2015-11-05 DIAGNOSIS — C9000 Multiple myeloma not having achieved remission: Secondary | ICD-10-CM

## 2015-11-05 MED ORDER — PROCHLORPERAZINE MALEATE 10 MG PO TABS
ORAL_TABLET | ORAL | Status: AC
  Filled 2015-11-05: qty 1

## 2015-11-05 MED ORDER — BORTEZOMIB CHEMO SQ INJECTION 3.5 MG (2.5MG/ML)
1.3000 mg/m2 | Freq: Once | INTRAMUSCULAR | Status: AC
  Administered 2015-11-05: 2.5 mg via SUBCUTANEOUS
  Filled 2015-11-05: qty 2.5

## 2015-11-05 MED ORDER — PROCHLORPERAZINE MALEATE 10 MG PO TABS
10.0000 mg | ORAL_TABLET | Freq: Once | ORAL | Status: AC
  Administered 2015-11-05: 10 mg via ORAL

## 2015-11-05 NOTE — Patient Instructions (Signed)
[facility] Discharge Instructions for Patients Receiving Chemotherapy  Today you received the following chemotherapy agents:  Velcade  To help prevent nausea and vomiting after your treatment, we encourage you to take your nausea medication as prescribed.   If you develop nausea and vomiting that is not controlled by your nausea medication, call the clinic.   BELOW ARE SYMPTOMS THAT SHOULD BE REPORTED IMMEDIATELY:  *FEVER GREATER THAN 100.5 F  *CHILLS WITH OR WITHOUT FEVER  NAUSEA AND VOMITING THAT IS NOT CONTROLLED WITH YOUR NAUSEA MEDICATION  *UNUSUAL SHORTNESS OF BREATH  *UNUSUAL BRUISING OR BLEEDING  TENDERNESS IN MOUTH AND THROAT WITH OR WITHOUT PRESENCE OF ULCERS  *URINARY PROBLEMS  *BOWEL PROBLEMS  UNUSUAL RASH Items with * indicate a potential emergency and should be followed up as soon as possible.  Feel free to call the clinic you have any questions or concerns. The clinic phone number is [phone].  Please show the CHEMO ALERT CARD at check-in to the Emergency Department and triage nurse.   

## 2015-11-13 ENCOUNTER — Other Ambulatory Visit: Payer: Self-pay | Admitting: *Deleted

## 2015-11-13 MED ORDER — LENALIDOMIDE 10 MG PO CAPS: 10.0000 mg | ORAL_CAPSULE | Freq: Every day | ORAL | Status: DC

## 2015-11-16 ENCOUNTER — Ambulatory Visit (HOSPITAL_BASED_OUTPATIENT_CLINIC_OR_DEPARTMENT_OTHER): Payer: No Typology Code available for payment source

## 2015-11-16 ENCOUNTER — Other Ambulatory Visit (HOSPITAL_BASED_OUTPATIENT_CLINIC_OR_DEPARTMENT_OTHER): Payer: No Typology Code available for payment source

## 2015-11-16 VITALS — BP 129/84 | HR 86 | Temp 99.0°F | Resp 18

## 2015-11-16 DIAGNOSIS — C9000 Multiple myeloma not having achieved remission: Secondary | ICD-10-CM

## 2015-11-16 DIAGNOSIS — R803 Bence Jones proteinuria: Secondary | ICD-10-CM

## 2015-11-16 DIAGNOSIS — Z5112 Encounter for antineoplastic immunotherapy: Secondary | ICD-10-CM

## 2015-11-16 LAB — COMPREHENSIVE METABOLIC PANEL
ALBUMIN: 3.5 g/dL (ref 3.5–5.0)
ALK PHOS: 92 U/L (ref 40–150)
ALT: 29 U/L (ref 0–55)
ANION GAP: 8 meq/L (ref 3–11)
AST: 21 U/L (ref 5–34)
BUN: 10.5 mg/dL (ref 7.0–26.0)
CALCIUM: 9 mg/dL (ref 8.4–10.4)
CO2: 25 mEq/L (ref 22–29)
Chloride: 111 mEq/L — ABNORMAL HIGH (ref 98–109)
Creatinine: 1.2 mg/dL (ref 0.7–1.3)
EGFR: 75 mL/min/{1.73_m2} — AB (ref >90)
Glucose: 119 mg/dl (ref 70–140)
POTASSIUM: 4.1 meq/L (ref 3.5–5.1)
Sodium: 143 mEq/L (ref 136–145)
Total Bilirubin: 0.46 mg/dL (ref 0.20–1.20)
Total Protein: 6.5 g/dL (ref 6.4–8.3)

## 2015-11-16 LAB — CBC WITH DIFFERENTIAL/PLATELET
BASO%: 1.2 % (ref 0.0–2.0)
Basophils Absolute: 0.1 10*3/uL (ref 0.0–0.1)
EOS ABS: 0.1 10*3/uL (ref 0.0–0.5)
EOS%: 1.4 % (ref 0.0–7.0)
HCT: 40.9 % (ref 38.4–49.9)
HGB: 13.7 g/dL (ref 13.0–17.1)
LYMPH%: 27.6 % (ref 14.0–49.0)
MCH: 31.8 pg (ref 27.2–33.4)
MCHC: 33.5 g/dL (ref 32.0–36.0)
MCV: 95.1 fL (ref 79.3–98.0)
MONO#: 0.1 10*3/uL (ref 0.1–0.9)
MONO%: 3.3 % (ref 0.0–14.0)
NEUT%: 66.5 % (ref 39.0–75.0)
NEUTROS ABS: 2.9 10*3/uL (ref 1.5–6.5)
Platelets: 243 10*3/uL (ref 140–400)
RBC: 4.3 10*6/uL (ref 4.20–5.82)
RDW: 14.4 % (ref 11.0–14.6)
WBC: 4.4 10*3/uL (ref 4.0–10.3)
lymph#: 1.2 10*3/uL (ref 0.9–3.3)

## 2015-11-16 MED ORDER — PROCHLORPERAZINE MALEATE 10 MG PO TABS
10.0000 mg | ORAL_TABLET | Freq: Once | ORAL | Status: AC
Start: 2015-11-16 — End: 2015-11-16
  Administered 2015-11-16: 10 mg via ORAL

## 2015-11-16 MED ORDER — PROCHLORPERAZINE MALEATE 10 MG PO TABS
ORAL_TABLET | ORAL | Status: AC
Start: 2015-11-16 — End: 2015-11-16
  Filled 2015-11-16: qty 1

## 2015-11-16 MED ORDER — BORTEZOMIB CHEMO SQ INJECTION 3.5 MG (2.5MG/ML)
1.3000 mg/m2 | Freq: Once | INTRAMUSCULAR | Status: AC
  Administered 2015-11-16: 2.5 mg via SUBCUTANEOUS
  Filled 2015-11-16: qty 2.5

## 2015-11-16 MED FILL — LISINOPRIL-HCTZ 10-12.5 MG: 10-12.5 | 30 days supply | Qty: 30 | Fill #0

## 2015-11-16 NOTE — Patient Instructions (Signed)
[facility] Discharge Instructions for Patients Receiving Chemotherapy  Today you received the following chemotherapy agents:  Velcade  To help prevent nausea and vomiting after your treatment, we encourage you to take your nausea medication as prescribed.   If you develop nausea and vomiting that is not controlled by your nausea medication, call the clinic.   BELOW ARE SYMPTOMS THAT SHOULD BE REPORTED IMMEDIATELY:  *FEVER GREATER THAN 100.5 F  *CHILLS WITH OR WITHOUT FEVER  NAUSEA AND VOMITING THAT IS NOT CONTROLLED WITH YOUR NAUSEA MEDICATION  *UNUSUAL SHORTNESS OF BREATH  *UNUSUAL BRUISING OR BLEEDING  TENDERNESS IN MOUTH AND THROAT WITH OR WITHOUT PRESENCE OF ULCERS  *URINARY PROBLEMS  *BOWEL PROBLEMS  UNUSUAL RASH Items with * indicate a potential emergency and should be followed up as soon as possible.  Feel free to call the clinic you have any questions or concerns. The clinic phone number is [phone].  Please show the CHEMO ALERT CARD at check-in to the Emergency Department and triage nurse.   

## 2015-11-17 LAB — KAPPA/LAMBDA LIGHT CHAINS
IG LAMBDA FREE LIGHT CHAIN: 827.22 mg/L — AB (ref 5.71–26.30)
Ig Kappa Free Light Chain: 11.3 mg/L (ref 3.30–19.40)
KAPPA/LAMBDA FLC RATIO: 0.01 — AB (ref 0.26–1.65)

## 2015-11-18 LAB — MULTIPLE MYELOMA PANEL, SERUM
ALBUMIN SERPL ELPH-MCNC: 3.7 g/dL (ref 2.9–4.4)
ALPHA2 GLOB SERPL ELPH-MCNC: 0.6 g/dL (ref 0.4–1.0)
Albumin/Glob SerPl: 1.5 (ref 0.7–1.7)
Alpha 1: 0.2 g/dL (ref 0.0–0.4)
B-GLOBULIN SERPL ELPH-MCNC: 1.1 g/dL (ref 0.7–1.3)
GAMMA GLOB SERPL ELPH-MCNC: 0.6 g/dL (ref 0.4–1.8)
GLOBULIN, TOTAL: 2.5 g/dL (ref 2.2–3.9)
IGG (IMMUNOGLOBIN G), SERUM: 616 mg/dL — AB (ref 700–1600)
IgA, Qn, Serum: 31 mg/dL — ABNORMAL LOW (ref 90–386)
IgM, Qn, Serum: 10 mg/dL — ABNORMAL LOW (ref 20–172)
TOTAL PROTEIN: 6.2 g/dL (ref 6.0–8.5)

## 2015-11-19 ENCOUNTER — Ambulatory Visit (HOSPITAL_BASED_OUTPATIENT_CLINIC_OR_DEPARTMENT_OTHER): Payer: Medicaid Other

## 2015-11-19 VITALS — BP 135/90 | HR 77 | Temp 97.8°F

## 2015-11-19 DIAGNOSIS — R803 Bence Jones proteinuria: Secondary | ICD-10-CM

## 2015-11-19 DIAGNOSIS — Z5112 Encounter for antineoplastic immunotherapy: Secondary | ICD-10-CM

## 2015-11-19 DIAGNOSIS — C9000 Multiple myeloma not having achieved remission: Secondary | ICD-10-CM

## 2015-11-19 MED ORDER — PROCHLORPERAZINE MALEATE 10 MG PO TABS
ORAL_TABLET | ORAL | Status: AC
  Filled 2015-11-19: qty 1

## 2015-11-19 MED ORDER — PROCHLORPERAZINE MALEATE 10 MG PO TABS
10.0000 mg | ORAL_TABLET | Freq: Once | ORAL | Status: AC
  Administered 2015-11-19: 10 mg via ORAL

## 2015-11-19 MED ORDER — BORTEZOMIB CHEMO SQ INJECTION 3.5 MG (2.5MG/ML)
1.3000 mg/m2 | Freq: Once | INTRAMUSCULAR | Status: AC
Start: 2015-11-19 — End: 2015-11-19
  Administered 2015-11-19: 2.5 mg via SUBCUTANEOUS
  Filled 2015-11-19: qty 2.5

## 2015-11-19 NOTE — Patient Instructions (Signed)
[facility] Discharge Instructions for Patients Receiving Chemotherapy  Today you received the following chemotherapy agents:  Velcade  To help prevent nausea and vomiting after your treatment, we encourage you to take your nausea medication as prescribed.   If you develop nausea and vomiting that is not controlled by your nausea medication, call the clinic.   BELOW ARE SYMPTOMS THAT SHOULD BE REPORTED IMMEDIATELY:  *FEVER GREATER THAN 100.5 F  *CHILLS WITH OR WITHOUT FEVER  NAUSEA AND VOMITING THAT IS NOT CONTROLLED WITH YOUR NAUSEA MEDICATION  *UNUSUAL SHORTNESS OF BREATH  *UNUSUAL BRUISING OR BLEEDING  TENDERNESS IN MOUTH AND THROAT WITH OR WITHOUT PRESENCE OF ULCERS  *URINARY PROBLEMS  *BOWEL PROBLEMS  UNUSUAL RASH Items with * indicate a potential emergency and should be followed up as soon as possible.  Feel free to call the clinic you have any questions or concerns. The clinic phone number is [phone].  Please show the CHEMO ALERT CARD at check-in to the Emergency Department and triage nurse.   

## 2015-11-23 ENCOUNTER — Ambulatory Visit (HOSPITAL_BASED_OUTPATIENT_CLINIC_OR_DEPARTMENT_OTHER): Payer: No Typology Code available for payment source

## 2015-11-23 ENCOUNTER — Other Ambulatory Visit (HOSPITAL_BASED_OUTPATIENT_CLINIC_OR_DEPARTMENT_OTHER): Payer: No Typology Code available for payment source

## 2015-11-23 ENCOUNTER — Telehealth: Payer: Self-pay | Admitting: Hematology and Oncology

## 2015-11-23 ENCOUNTER — Ambulatory Visit (HOSPITAL_BASED_OUTPATIENT_CLINIC_OR_DEPARTMENT_OTHER): Payer: No Typology Code available for payment source | Admitting: Hematology and Oncology

## 2015-11-23 ENCOUNTER — Other Ambulatory Visit: Payer: Self-pay | Admitting: Hematology and Oncology

## 2015-11-23 ENCOUNTER — Telehealth: Payer: Self-pay | Admitting: Family Medicine

## 2015-11-23 VITALS — BP 140/86 | HR 89 | Temp 98.3°F | Resp 20 | Ht 66.0 in | Wt 172.7 lb

## 2015-11-23 DIAGNOSIS — K089 Disorder of teeth and supporting structures, unspecified: Secondary | ICD-10-CM

## 2015-11-23 DIAGNOSIS — C9001 Multiple myeloma in remission: Secondary | ICD-10-CM

## 2015-11-23 DIAGNOSIS — C9000 Multiple myeloma not having achieved remission: Secondary | ICD-10-CM

## 2015-11-23 DIAGNOSIS — Z5112 Encounter for antineoplastic immunotherapy: Secondary | ICD-10-CM

## 2015-11-23 DIAGNOSIS — R803 Bence Jones proteinuria: Secondary | ICD-10-CM

## 2015-11-23 DIAGNOSIS — G893 Neoplasm related pain (acute) (chronic): Secondary | ICD-10-CM

## 2015-11-23 LAB — CBC WITH DIFFERENTIAL/PLATELET
BASO%: 0.5 % (ref 0.0–2.0)
BASOS ABS: 0 10*3/uL (ref 0.0–0.1)
EOS ABS: 0.3 10*3/uL (ref 0.0–0.5)
EOS%: 8.1 % — ABNORMAL HIGH (ref 0.0–7.0)
HCT: 43.7 % (ref 38.4–49.9)
HGB: 14.3 g/dL (ref 13.0–17.1)
LYMPH%: 49.4 % — AB (ref 14.0–49.0)
MCH: 31.4 pg (ref 27.2–33.4)
MCHC: 32.7 g/dL (ref 32.0–36.0)
MCV: 96.2 fL (ref 79.3–98.0)
MONO#: 0.3 10*3/uL (ref 0.1–0.9)
MONO%: 8.1 % (ref 0.0–14.0)
NEUT%: 33.9 % — ABNORMAL LOW (ref 39.0–75.0)
NEUTROS ABS: 1.2 10*3/uL — AB (ref 1.5–6.5)
Platelets: 174 10*3/uL (ref 140–400)
RBC: 4.54 10*6/uL (ref 4.20–5.82)
RDW: 14.8 % — ABNORMAL HIGH (ref 11.0–14.6)
WBC: 3.6 10*3/uL — AB (ref 4.0–10.3)
lymph#: 1.8 10*3/uL (ref 0.9–3.3)

## 2015-11-23 LAB — COMPREHENSIVE METABOLIC PANEL
ALT: 29 U/L (ref 0–55)
AST: 14 U/L (ref 5–34)
Albumin: 3.5 g/dL (ref 3.5–5.0)
Alkaline Phosphatase: 88 U/L (ref 40–150)
Anion Gap: 9 mEq/L (ref 3–11)
BUN: 10.9 mg/dL (ref 7.0–26.0)
CO2: 27 meq/L (ref 22–29)
Calcium: 9.2 mg/dL (ref 8.4–10.4)
Chloride: 107 mEq/L (ref 98–109)
Creatinine: 1.1 mg/dL (ref 0.7–1.3)
EGFR: 88 mL/min/{1.73_m2} — AB (ref >90)
GLUCOSE: 102 mg/dL (ref 70–140)
POTASSIUM: 4 meq/L (ref 3.5–5.1)
SODIUM: 143 meq/L (ref 136–145)
Total Bilirubin: 0.41 mg/dL (ref 0.20–1.20)
Total Protein: 6.4 g/dL (ref 6.4–8.3)

## 2015-11-23 MED ORDER — LENALIDOMIDE 10 MG PO CAPS: 10.0000 mg | ORAL_CAPSULE | Freq: Every day | ORAL | Status: DC

## 2015-11-23 MED ORDER — PROCHLORPERAZINE MALEATE 10 MG PO TABS
ORAL_TABLET | ORAL | Status: AC
  Filled 2015-11-23: qty 1

## 2015-11-23 MED ORDER — PROCHLORPERAZINE MALEATE 10 MG PO TABS
10.0000 mg | ORAL_TABLET | Freq: Once | ORAL | Status: AC
  Administered 2015-11-23: 10 mg via ORAL

## 2015-11-23 MED ORDER — BORTEZOMIB CHEMO SQ INJECTION 3.5 MG (2.5MG/ML)
1.3000 mg/m2 | Freq: Once | INTRAMUSCULAR | Status: AC
  Administered 2015-11-23: 2.5 mg via SUBCUTANEOUS
  Filled 2015-11-23: qty 2.5

## 2015-11-23 MED ORDER — TRAMADOL HCL 50 MG PO TABS: ORAL_TABLET | ORAL | Status: DC

## 2015-11-23 MED FILL — traMADol HCL 50 MG TABS: 50 | 11 days supply | Qty: 90 | Fill #0

## 2015-11-23 NOTE — Assessment & Plan Note (Signed)
He has not obtained dental clearance. Zometa is still on hold for now.

## 2015-11-23 NOTE — Progress Notes (Signed)
Per dr Alvy Bimler okay to treat today despite labs.

## 2015-11-23 NOTE — Assessment & Plan Note (Signed)
The patient is adamant that he does not want to take narcotic therapy. He is currently taking tramadol as needed. We'll continue the same He is on calcium and vitamin D supplement

## 2015-11-23 NOTE — Assessment & Plan Note (Signed)
He is tolerating treatment well. Recent blood work show greater than 90% drop in serum light chain. Overall, his pain control is improving. I recommend he return back to work as tolerated. We are waiting for insurance approval so that he can be referred for discussion regarding the role of stem cell transplant. I plan to see him back next month for further review  I will taper him off dexamethasone next week.

## 2015-11-23 NOTE — Telephone Encounter (Signed)
Gave pt appt for April & may & avs

## 2015-11-23 NOTE — Progress Notes (Signed)
[facility] OFFICE PROGRESS NOTE  Patient Care Team: Boykin Nearing, MD as PCP - General (Family Medicine)  SUMMARY OF ONCOLOGIC HISTORY:   Multiple myeloma in remission ([place])   08/28/2015 Bone Marrow Biopsy Accession: [id number] biopsy showed 58% myeloma involvement. Cytogenetics was normal and FISH showed Del 13q and t(11;14).    09/07/2015 -  Chemotherapy he was started on combined treatment of Velcade and Revlimid with Dex   09/12/2015 - 09/16/2015 Hospital Admission He was admitted to the hospital with sepsis, treatment was placed on hold and he received one dose of GCSF for leukopenia    INTERVAL HISTORY: Please see below for problem oriented charting. He feels well apart from persistent shoulder pain. He is waiting for Medicaid approval. He has ran out of his prescription of Revlimid. He has appointment to see the dentist in 2 weeks for dental extraction. Denies recent infection.  REVIEW OF SYSTEMS:   Constitutional: Denies fevers, chills or abnormal weight loss Eyes: Denies blurriness of vision Ears, nose, mouth, throat, and face: Denies mucositis or sore throat Respiratory: Denies cough, dyspnea or wheezes Cardiovascular: Denies palpitation, chest discomfort or lower extremity swelling Gastrointestinal:  Denies nausea, heartburn or change in bowel habits Skin: Denies abnormal skin rashes Lymphatics: Denies new lymphadenopathy or easy bruising Neurological:Denies numbness, tingling or new weaknesses Behavioral/Psych: Mood is stable, no new changes  All other systems were reviewed with the patient and are negative.  I have reviewed the past medical history, past surgical history, social history and family history with the patient and they are unchanged from previous note.  ALLERGIES:  is allergic to latex.  MEDICATIONS:  Current Outpatient Prescriptions  Medication Sig Dispense Refill  . acyclovir (ZOVIRAX) 400 MG tablet Take 1 tablet (400 mg total) by mouth 2  (two) times daily. 60 tablet 3  . aspirin EC 81 MG tablet Take 81 mg by mouth daily.    . cholecalciferol (VITAMIN D) 1000 units tablet Take 2,000 Units by mouth daily.    Marland Kitchen dexamethasone (DECADRON) 4 MG tablet Take 5 tablets (20 mg) weekly on Mondays and Thursdays 60 tablet 3  . diclofenac sodium (VOLTAREN) 1 % GEL Apply 2 g topically 4 (four) times daily. 100 g 2  . lenalidomide (REVLIMID) 10 MG capsule Take 1 capsule (10 mg total) by mouth daily. For 14 days on and 7 days off 14 capsule 0  . lisinopril-hydrochlorothiazide (PRINZIDE,ZESTORETIC) 10-12.5 MG tablet Take 1 tablet by mouth daily. Reported on 10/06/2015 30 tablet 5  . ondansetron (ZOFRAN) 8 MG tablet Take 1 tablet (8 mg total) by mouth every 8 (eight) hours as needed (Nausea or vomiting). 30 tablet 1  . polyethylene glycol (MIRALAX / GLYCOLAX) packet Take 17 g by mouth 2 (two) times daily. 14 each 0  . prochlorperazine (COMPAZINE) 10 MG tablet Take 1 tablet (10 mg total) by mouth every 6 (six) hours as needed (Nausea or vomiting). 30 tablet 1  . senna-docusate (SENOKOT-S) 8.6-50 MG tablet Take 1 tablet by mouth 2 (two) times daily. 60 tablet 3  . traMADol (ULTRAM) 50 MG tablet Take one to two tablets every 6 hrs as needed for pain. 90 tablet 0  . pantoprazole (PROTONIX) 40 MG tablet Take 1 tablet (40 mg total) by mouth daily. 30 tablet 0   No current facility-administered medications for this visit.    PHYSICAL EXAMINATION: ECOG PERFORMANCE STATUS: 1 - Symptomatic but completely ambulatory  Filed Vitals:   11/23/15 1055  BP: 140/86  Pulse: 89  Temp: 98.3 F (36.8 C)  Resp: 20   Filed Weights   11/23/15 1055  Weight: 172 lb 11.2 oz (78.336 kg)    GENERAL:alert, no distress and comfortable SKIN: skin color, texture, turgor are normal, no rashes or significant lesions EYES: normal, Conjunctiva are pink and non-injected, sclera clear Musculoskeletal:no cyanosis of digits and no clubbing  NEURO: alert & oriented x 3 with  fluent speech, no focal motor/sensory deficits  LABORATORY DATA:  I have reviewed the data as listed    Component Value Date/Time   NA 143 11/23/2015 1043   NA 142 09/16/2015 0425   K 4.0 11/23/2015 1043   K 4.1 09/16/2015 0425   CL 112* 09/16/2015 0425   CO2 27 11/23/2015 1043   CO2 23 09/16/2015 0425   GLUCOSE 102 11/23/2015 1043   GLUCOSE 105* 09/16/2015 0425   BUN 10.9 11/23/2015 1043   BUN 13 09/16/2015 0425   CREATININE 1.1 11/23/2015 1043   CREATININE 1.13 09/16/2015 0425   CREATININE 1.13 07/24/2015 1155   CALCIUM 9.2 11/23/2015 1043   CALCIUM 8.2* 09/16/2015 0425   PROT 6.4 11/23/2015 1043   PROT 6.2 11/16/2015 0956   PROT 6.1* 09/14/2015 0440   ALBUMIN 3.5 11/23/2015 1043   ALBUMIN 3.5 09/14/2015 0440   AST 14 11/23/2015 1043   AST 33 09/14/2015 0440   ALT 29 11/23/2015 1043   ALT 37 09/14/2015 0440   ALKPHOS 88 11/23/2015 1043   ALKPHOS 47 09/14/2015 0440   BILITOT 0.41 11/23/2015 1043   BILITOT 0.4 09/14/2015 0440   GFRNONAA >60 09/16/2015 0425   GFRNONAA 71 07/24/2015 1155   GFRAA >60 09/16/2015 0425   GFRAA 82 07/24/2015 1155    No results found for: SPEP, UPEP  Lab Results  Component Value Date   WBC 3.6* 11/23/2015   NEUTROABS 1.2* 11/23/2015   HGB 14.3 11/23/2015   HCT 43.7 11/23/2015   MCV 96.2 11/23/2015   PLT 174 11/23/2015      Chemistry      Component Value Date/Time   NA 143 11/23/2015 1043   NA 142 09/16/2015 0425   K 4.0 11/23/2015 1043   K 4.1 09/16/2015 0425   CL 112* 09/16/2015 0425   CO2 27 11/23/2015 1043   CO2 23 09/16/2015 0425   BUN 10.9 11/23/2015 1043   BUN 13 09/16/2015 0425   CREATININE 1.1 11/23/2015 1043   CREATININE 1.13 09/16/2015 0425   CREATININE 1.13 07/24/2015 1155      Component Value Date/Time   CALCIUM 9.2 11/23/2015 1043   CALCIUM 8.2* 09/16/2015 0425   ALKPHOS 88 11/23/2015 1043   ALKPHOS 47 09/14/2015 0440   AST 14 11/23/2015 1043   AST 33 09/14/2015 0440   ALT 29 11/23/2015 1043   ALT 37  09/14/2015 0440   BILITOT 0.41 11/23/2015 1043   BILITOT 0.4 09/14/2015 0440      ASSESSMENT & PLAN:  Multiple myeloma in remission ([facility]) He is tolerating treatment well. Recent blood work show greater than 90% drop in serum light chain. Overall, his pain control is improving. I recommend he return back to work as tolerated. We are waiting for insurance approval so that he can be referred for discussion regarding the role of stem cell transplant. I plan to see him back next month for further review  I will taper him off dexamethasone next week.  Cancer associated pain  The patient is adamant that he does not want to take narcotic therapy. He is currently taking   tramadol as needed. We'll continue the same He is on calcium and vitamin D supplement  Poor dentition He has not obtained dental clearance. Zometa is still on hold for now.   No orders of the defined types were placed in this encounter.   All questions were answered. The patient knows to call the clinic with any problems, questions or concerns. No barriers to learning was detected. I spent 15 minutes counseling the patient face to face. The total time spent in the appointment was 20 minutes and more than 50% was on counseling and review of test results     GORSUCH, NI, MD 11/23/2015 11:19 AM    

## 2015-11-23 NOTE — Telephone Encounter (Signed)
Pt. Called requesting to speak to his PCP. Pt. Would like to know if his PCP has received any information from his Medicaid. Please f/u with pt.

## 2015-11-23 NOTE — Patient Instructions (Signed)
[facility] Discharge Instructions for Patients Receiving Chemotherapy  Today you received the following chemotherapy agents: Velcade.  To help prevent nausea and vomiting after your treatment, we encourage you to take your nausea medication: Compazine 10 mg every 6 hours as needed.   If you develop nausea and vomiting that is not controlled by your nausea medication, call the clinic.   BELOW ARE SYMPTOMS THAT SHOULD BE REPORTED IMMEDIATELY:  *FEVER GREATER THAN 100.5 F  *CHILLS WITH OR WITHOUT FEVER  NAUSEA AND VOMITING THAT IS NOT CONTROLLED WITH YOUR NAUSEA MEDICATION  *UNUSUAL SHORTNESS OF BREATH  *UNUSUAL BRUISING OR BLEEDING  TENDERNESS IN MOUTH AND THROAT WITH OR WITHOUT PRESENCE OF ULCERS  *URINARY PROBLEMS  *BOWEL PROBLEMS  UNUSUAL RASH Items with * indicate a potential emergency and should be followed up as soon as possible.  Feel free to call the clinic you have any questions or concerns. The clinic phone number is [phone].  Please show the CHEMO ALERT CARD at check-in to the Emergency Department and triage nurse.   

## 2015-11-24 ENCOUNTER — Encounter: Payer: Self-pay | Admitting: Pharmacist

## 2015-11-24 ENCOUNTER — Other Ambulatory Visit: Payer: Self-pay | Admitting: *Deleted

## 2015-11-24 NOTE — Telephone Encounter (Signed)
Revlimid refill was E-scribed to [facility] yesterday.

## 2015-11-24 NOTE — Progress Notes (Signed)
Oral Chemotherapy Follow-Up Form  Original Start date of oral chemotherapy: _1/26/17___   Called patient today to follow up regarding patient's oral chemotherapy medication: _Revlimid/Velcade__  Pt is doing well today. No complaints or issues to report. He received velcade yesterday and will restart the Revlimid when it is delivered tomorrow (delay in specialty delivery). He is doing well. Medicaid still pending but Erik Nguyen called yesterday and states he should hear something in the next week or 2.   Pt reports __2 days_missed in the last month.  Missed dose(s) attributed to: _delay in specialty delivery___  Pt reports the following side effects: _none__    Will follow up and call patient again in _3 weeks___   Thank you,  Erik Nguyen, PharmD, [facility]

## 2015-11-26 ENCOUNTER — Telehealth: Payer: Self-pay | Admitting: *Deleted

## 2015-11-26 ENCOUNTER — Telehealth: Payer: Self-pay | Admitting: Pharmacist

## 2015-11-26 ENCOUNTER — Ambulatory Visit (HOSPITAL_BASED_OUTPATIENT_CLINIC_OR_DEPARTMENT_OTHER): Payer: No Typology Code available for payment source

## 2015-11-26 VITALS — BP 153/67 | HR 71 | Temp 97.8°F | Resp 18

## 2015-11-26 DIAGNOSIS — Z5112 Encounter for antineoplastic immunotherapy: Secondary | ICD-10-CM

## 2015-11-26 DIAGNOSIS — C9000 Multiple myeloma not having achieved remission: Secondary | ICD-10-CM

## 2015-11-26 DIAGNOSIS — R803 Bence Jones proteinuria: Secondary | ICD-10-CM

## 2015-11-26 MED ORDER — BORTEZOMIB CHEMO SQ INJECTION 3.5 MG (2.5MG/ML)
1.3000 mg/m2 | Freq: Once | INTRAMUSCULAR | Status: AC
  Administered 2015-11-26: 2.5 mg via SUBCUTANEOUS
  Filled 2015-11-26: qty 2.5

## 2015-11-26 MED ORDER — PROCHLORPERAZINE MALEATE 10 MG PO TABS
ORAL_TABLET | ORAL | Status: AC
  Filled 2015-11-26: qty 1

## 2015-11-26 MED ORDER — PROCHLORPERAZINE MALEATE 10 MG PO TABS
10.0000 mg | ORAL_TABLET | Freq: Once | ORAL | Status: AC
  Administered 2015-11-26: 10 mg via ORAL

## 2015-11-26 NOTE — Telephone Encounter (Signed)
11/26/15: patient called today to let us know he has been approved for Medicaid. He will [facility] to provide them with his medicaid info so his revlimid can start being covered through Medicaid. Patient should now be able to be referred for possible transplant.  Thank you,  Montel Clock, PharmD, [facility]

## 2015-11-26 NOTE — Patient Instructions (Signed)
[facility] Discharge Instructions for Patients Receiving Chemotherapy  Today you received the following chemotherapy agents: Velcade.  To help prevent nausea and vomiting after your treatment, we encourage you to take your nausea medication: Compazine 10 mg every 6 hours as needed.   If you develop nausea and vomiting that is not controlled by your nausea medication, call the clinic.   BELOW ARE SYMPTOMS THAT SHOULD BE REPORTED IMMEDIATELY:  *FEVER GREATER THAN 100.5 F  *CHILLS WITH OR WITHOUT FEVER  NAUSEA AND VOMITING THAT IS NOT CONTROLLED WITH YOUR NAUSEA MEDICATION  *UNUSUAL SHORTNESS OF BREATH  *UNUSUAL BRUISING OR BLEEDING  TENDERNESS IN MOUTH AND THROAT WITH OR WITHOUT PRESENCE OF ULCERS  *URINARY PROBLEMS  *BOWEL PROBLEMS  UNUSUAL RASH Items with * indicate a potential emergency and should be followed up as soon as possible.  Feel free to call the clinic you have any questions or concerns. The clinic phone number is [phone].  Please show the CHEMO ALERT CARD at check-in to the Emergency Department and triage nurse.   

## 2015-11-26 NOTE — Telephone Encounter (Signed)
duplicate

## 2015-11-26 NOTE — Telephone Encounter (Signed)
Pt called to report he now has Medicaid.  His Medicaid VN:[id number] K.   Informed pt we did received a confirmation letter from [facility] which I forwarded to [facility] in [place].   Informed him we will make referral to [facility] for Bone Marrow Transplant MD.  He verbalized understanding.  Called New Pt Coordinator, Anderson Malta, at [facility] phone [phone] and left VM to notify that Pt now has Medicaid and to please proceed w/ referral.  Please call us if they need any additional information/records.

## 2015-12-01 ENCOUNTER — Telehealth: Payer: Self-pay | Admitting: *Deleted

## 2015-12-01 NOTE — Telephone Encounter (Signed)
Call received in [place] from pt stating " I am calling Dr Calton Dach nurse because I have a fax number to send my information for disability ".  Pt gave fax number of [phone] with attention to Neta Ehlers,  This  RN informed pt above information will be communicated with RN at desk but also our managed care department who handles pt's disability papers.  No other questions at this time.

## 2015-12-04 NOTE — Telephone Encounter (Signed)
No, I have not. Patient will need to contact:   [place] [employer]  [address] [place], [place] [address]  [phone]

## 2015-12-07 ENCOUNTER — Other Ambulatory Visit (HOSPITAL_BASED_OUTPATIENT_CLINIC_OR_DEPARTMENT_OTHER): Payer: Medicaid Other

## 2015-12-07 ENCOUNTER — Telehealth: Payer: Self-pay | Admitting: *Deleted

## 2015-12-07 ENCOUNTER — Other Ambulatory Visit: Payer: Self-pay | Admitting: Hematology and Oncology

## 2015-12-07 ENCOUNTER — Ambulatory Visit (HOSPITAL_BASED_OUTPATIENT_CLINIC_OR_DEPARTMENT_OTHER): Payer: Medicaid Other

## 2015-12-07 VITALS — BP 137/87 | HR 78 | Temp 98.0°F | Resp 16

## 2015-12-07 DIAGNOSIS — C9001 Multiple myeloma in remission: Secondary | ICD-10-CM

## 2015-12-07 DIAGNOSIS — Z5112 Encounter for antineoplastic immunotherapy: Secondary | ICD-10-CM | POA: Diagnosis not present

## 2015-12-07 DIAGNOSIS — C9000 Multiple myeloma not having achieved remission: Secondary | ICD-10-CM

## 2015-12-07 DIAGNOSIS — R803 Bence Jones proteinuria: Secondary | ICD-10-CM

## 2015-12-07 LAB — COMPREHENSIVE METABOLIC PANEL
ALBUMIN: 3.6 g/dL (ref 3.5–5.0)
ALT: 20 U/L (ref 0–55)
ANION GAP: 9 meq/L (ref 3–11)
AST: 18 U/L (ref 5–34)
Alkaline Phosphatase: 81 U/L (ref 40–150)
BUN: 13.8 mg/dL (ref 7.0–26.0)
CALCIUM: 9.3 mg/dL (ref 8.4–10.4)
CO2: 23 mEq/L (ref 22–29)
CREATININE: 1.1 mg/dL (ref 0.7–1.3)
Chloride: 109 mEq/L (ref 98–109)
EGFR: 88 mL/min/{1.73_m2} — ABNORMAL LOW (ref >90)
Glucose: 82 mg/dl (ref 70–140)
Potassium: 3.8 mEq/L (ref 3.5–5.1)
Sodium: 141 mEq/L (ref 136–145)
TOTAL PROTEIN: 6.8 g/dL (ref 6.4–8.3)
Total Bilirubin: 0.58 mg/dL (ref 0.20–1.20)

## 2015-12-07 LAB — CBC WITH DIFFERENTIAL/PLATELET
BASO%: 0.6 % (ref 0.0–2.0)
Basophils Absolute: 0 10*3/uL (ref 0.0–0.1)
EOS ABS: 0.3 10*3/uL (ref 0.0–0.5)
EOS%: 5.4 % (ref 0.0–7.0)
HEMATOCRIT: 42.2 % (ref 38.4–49.9)
HGB: 14.6 g/dL (ref 13.0–17.1)
LYMPH#: 2.1 10*3/uL (ref 0.9–3.3)
LYMPH%: 41.9 % (ref 14.0–49.0)
MCH: 31.9 pg (ref 27.2–33.4)
MCHC: 34.6 g/dL (ref 32.0–36.0)
MCV: 92.3 fL (ref 79.3–98.0)
MONO#: 0.6 10*3/uL (ref 0.1–0.9)
MONO%: 11.7 % (ref 0.0–14.0)
NEUT%: 40.4 % (ref 39.0–75.0)
NEUTROS ABS: 2 10*3/uL (ref 1.5–6.5)
PLATELETS: 212 10*3/uL (ref 140–400)
RBC: 4.57 10*6/uL (ref 4.20–5.82)
RDW: 13.7 % (ref 11.0–14.6)
WBC: 5 10*3/uL (ref 4.0–10.3)

## 2015-12-07 MED ORDER — PROCHLORPERAZINE MALEATE 10 MG PO TABS
ORAL_TABLET | ORAL | Status: AC
  Filled 2015-12-07: qty 1

## 2015-12-07 MED ORDER — BORTEZOMIB CHEMO SQ INJECTION 3.5 MG (2.5MG/ML)
1.3000 mg/m2 | Freq: Once | INTRAMUSCULAR | Status: AC
  Administered 2015-12-07: 2.5 mg via SUBCUTANEOUS
  Filled 2015-12-07: qty 2.5

## 2015-12-07 MED ORDER — PROCHLORPERAZINE MALEATE 10 MG PO TABS
10.0000 mg | ORAL_TABLET | Freq: Once | ORAL | Status: AC
  Administered 2015-12-07: 10 mg via ORAL

## 2015-12-07 NOTE — Telephone Encounter (Signed)
Davy Pique, New Patient Coordinator,  Returned call and obtained Medicaid information/ number from this nurse.  She will place referral through their financial dept and let us know status when she hears back.

## 2015-12-07 NOTE — Telephone Encounter (Signed)
Anderson Malta called back w/ Consult w/ Dr. Norma Fredrickson at [facility] on May 17 th at 10 am.  Pt to arrive at 9:45 am.  They will mail packet of information to pt's home.  Anderson Malta is going to call our Pathology and Radiology dept directly to request [employer].  She says she can access the rest of info via [place]. Called pt and informed him of appt date/ time and gave him phone number to their office if he has questions or needs to change his appt.Marland Kitchen He verbalized understanding.

## 2015-12-07 NOTE — Patient Instructions (Signed)
[facility] Discharge Instructions for Patients Receiving Chemotherapy  Today you received the following chemotherapy agents:  Velcade  To help prevent nausea and vomiting after your treatment, we encourage you to take your nausea medication as prescribed.   If you develop nausea and vomiting that is not controlled by your nausea medication, call the clinic.   BELOW ARE SYMPTOMS THAT SHOULD BE REPORTED IMMEDIATELY:  *FEVER GREATER THAN 100.5 F  *CHILLS WITH OR WITHOUT FEVER  NAUSEA AND VOMITING THAT IS NOT CONTROLLED WITH YOUR NAUSEA MEDICATION  *UNUSUAL SHORTNESS OF BREATH  *UNUSUAL BRUISING OR BLEEDING  TENDERNESS IN MOUTH AND THROAT WITH OR WITHOUT PRESENCE OF ULCERS  *URINARY PROBLEMS  *BOWEL PROBLEMS  UNUSUAL RASH Items with * indicate a potential emergency and should be followed up as soon as possible.  Feel free to call the clinic you have any questions or concerns. The clinic phone number is [phone].  Please show the CHEMO ALERT CARD at check-in to the Emergency Department and triage nurse.   

## 2015-12-07 NOTE — Telephone Encounter (Signed)
Pt states he has not heard about referral to [facility] yet.   I called again and left VM for Anderson Malta informing pt now has Medicaid and to please let us know what they need from [place] to process the referral to BMT MD.

## 2015-12-10 ENCOUNTER — Ambulatory Visit (HOSPITAL_BASED_OUTPATIENT_CLINIC_OR_DEPARTMENT_OTHER): Payer: No Typology Code available for payment source

## 2015-12-10 VITALS — BP 127/91 | HR 79 | Temp 98.6°F

## 2015-12-10 DIAGNOSIS — C9001 Multiple myeloma in remission: Secondary | ICD-10-CM

## 2015-12-10 DIAGNOSIS — R803 Bence Jones proteinuria: Secondary | ICD-10-CM

## 2015-12-10 DIAGNOSIS — Z5112 Encounter for antineoplastic immunotherapy: Secondary | ICD-10-CM

## 2015-12-10 MED ORDER — PROCHLORPERAZINE MALEATE 10 MG PO TABS
10.0000 mg | ORAL_TABLET | Freq: Once | ORAL | Status: AC
  Administered 2015-12-10: 10 mg via ORAL

## 2015-12-10 MED ORDER — PROCHLORPERAZINE MALEATE 10 MG PO TABS
ORAL_TABLET | ORAL | Status: AC
  Filled 2015-12-10: qty 1

## 2015-12-10 MED ORDER — BORTEZOMIB CHEMO SQ INJECTION 3.5 MG (2.5MG/ML)
1.3000 mg/m2 | Freq: Once | INTRAMUSCULAR | Status: AC
  Administered 2015-12-10: 2.5 mg via SUBCUTANEOUS
  Filled 2015-12-10: qty 2.5

## 2015-12-10 NOTE — Progress Notes (Signed)
Patient reports bumpy itchy rash to arms, back and abdomen. Called Dr. Alvy Bimler and she advised patient to hold Revlimid, take Benadryl 25 mg po Tid for itching and to have wife take picture of rash and she will evaluate it next week when she sees him. Patient verbalized understanding of these instructions.

## 2015-12-10 NOTE — Patient Instructions (Signed)
[facility] Discharge Instructions for Patients Receiving Chemotherapy  Today you received the following chemotherapy agents:  Velcade  To help prevent nausea and vomiting after your treatment, we encourage you to take your nausea medication as prescribed.   If you develop nausea and vomiting that is not controlled by your nausea medication, call the clinic.   BELOW ARE SYMPTOMS THAT SHOULD BE REPORTED IMMEDIATELY:  *FEVER GREATER THAN 100.5 F  *CHILLS WITH OR WITHOUT FEVER  NAUSEA AND VOMITING THAT IS NOT CONTROLLED WITH YOUR NAUSEA MEDICATION  *UNUSUAL SHORTNESS OF BREATH  *UNUSUAL BRUISING OR BLEEDING  TENDERNESS IN MOUTH AND THROAT WITH OR WITHOUT PRESENCE OF ULCERS  *URINARY PROBLEMS  *BOWEL PROBLEMS  UNUSUAL RASH Items with * indicate a potential emergency and should be followed up as soon as possible.  Feel free to call the clinic you have any questions or concerns. The clinic phone number is [phone].  Please show the CHEMO ALERT CARD at check-in to the Emergency Department and triage nurse.   

## 2015-12-14 ENCOUNTER — Telehealth: Payer: Self-pay | Admitting: Hematology and Oncology

## 2015-12-14 ENCOUNTER — Emergency Department (HOSPITAL_COMMUNITY)
Admission: EM | Admit: 2015-12-14 | Discharge: 2015-12-14 | Disposition: A | Discharge status: Home / Self Care | Payer: Medicaid Other | Attending: Emergency Medicine | Admitting: Emergency Medicine

## 2015-12-14 ENCOUNTER — Telehealth: Payer: Self-pay | Admitting: *Deleted

## 2015-12-14 ENCOUNTER — Ambulatory Visit (HOSPITAL_BASED_OUTPATIENT_CLINIC_OR_DEPARTMENT_OTHER): Payer: No Typology Code available for payment source

## 2015-12-14 ENCOUNTER — Encounter: Payer: Self-pay | Admitting: Hematology and Oncology

## 2015-12-14 ENCOUNTER — Other Ambulatory Visit (HOSPITAL_BASED_OUTPATIENT_CLINIC_OR_DEPARTMENT_OTHER): Payer: No Typology Code available for payment source

## 2015-12-14 ENCOUNTER — Ambulatory Visit (HOSPITAL_BASED_OUTPATIENT_CLINIC_OR_DEPARTMENT_OTHER): Payer: No Typology Code available for payment source | Admitting: Hematology and Oncology

## 2015-12-14 VITALS — BP 156/93 | HR 84 | Temp 98.4°F | Resp 18 | Wt 173.2 lb

## 2015-12-14 DIAGNOSIS — R1030 Lower abdominal pain, unspecified: Secondary | ICD-10-CM | POA: Diagnosis present

## 2015-12-14 DIAGNOSIS — R803 Bence Jones proteinuria: Secondary | ICD-10-CM

## 2015-12-14 DIAGNOSIS — K209 Esophagitis, unspecified without bleeding: Secondary | ICD-10-CM

## 2015-12-14 DIAGNOSIS — Z7982 Long term (current) use of aspirin: Secondary | ICD-10-CM | POA: Insufficient documentation

## 2015-12-14 DIAGNOSIS — C9000 Multiple myeloma not having achieved remission: Secondary | ICD-10-CM | POA: Insufficient documentation

## 2015-12-14 DIAGNOSIS — Z7952 Long term (current) use of systemic steroids: Secondary | ICD-10-CM | POA: Insufficient documentation

## 2015-12-14 DIAGNOSIS — Z87891 Personal history of nicotine dependence: Secondary | ICD-10-CM | POA: Insufficient documentation

## 2015-12-14 DIAGNOSIS — G622 Polyneuropathy due to other toxic agents: Secondary | ICD-10-CM

## 2015-12-14 DIAGNOSIS — Z91040 Latex allergy status: Secondary | ICD-10-CM | POA: Insufficient documentation

## 2015-12-14 DIAGNOSIS — R21 Rash and other nonspecific skin eruption: Secondary | ICD-10-CM | POA: Insufficient documentation

## 2015-12-14 DIAGNOSIS — Z5112 Encounter for antineoplastic immunotherapy: Secondary | ICD-10-CM

## 2015-12-14 DIAGNOSIS — G620 Drug-induced polyneuropathy: Secondary | ICD-10-CM

## 2015-12-14 DIAGNOSIS — Z79899 Other long term (current) drug therapy: Secondary | ICD-10-CM | POA: Insufficient documentation

## 2015-12-14 DIAGNOSIS — C9001 Multiple myeloma in remission: Secondary | ICD-10-CM

## 2015-12-14 DIAGNOSIS — N4830 Priapism, unspecified: Secondary | ICD-10-CM | POA: Insufficient documentation

## 2015-12-14 DIAGNOSIS — M898X9 Other specified disorders of bone, unspecified site: Secondary | ICD-10-CM

## 2015-12-14 DIAGNOSIS — I10 Essential (primary) hypertension: Secondary | ICD-10-CM | POA: Insufficient documentation

## 2015-12-14 DIAGNOSIS — T451X5A Adverse effect of antineoplastic and immunosuppressive drugs, initial encounter: Secondary | ICD-10-CM

## 2015-12-14 DIAGNOSIS — Z79891 Long term (current) use of opiate analgesic: Secondary | ICD-10-CM | POA: Insufficient documentation

## 2015-12-14 LAB — CBC WITH DIFFERENTIAL/PLATELET
BASO%: 0.4 % (ref 0.0–2.0)
BASOS PCT: 0 %
Basophils Absolute: 0 10*3/uL (ref 0.0–0.1)
Basophils Absolute: 0 10*3/uL (ref 0.0–0.1)
EOS ABS: 0.1 10*3/uL (ref 0.0–0.7)
EOS ABS: 0.1 10*3/uL (ref 0.0–0.5)
EOS%: 0.7 % (ref 0.0–7.0)
Eosinophils Relative: 2 %
HCT: 42.4 % (ref 38.4–49.9)
HEMATOCRIT: 37.9 % — AB (ref 39.0–52.0)
HEMOGLOBIN: 13.4 g/dL (ref 13.0–17.0)
HEMOGLOBIN: 14.1 g/dL (ref 13.0–17.1)
LYMPH#: 1.8 10*3/uL (ref 0.9–3.3)
LYMPH%: 21.5 % (ref 14.0–49.0)
LYMPHS ABS: 2.1 10*3/uL (ref 0.7–4.0)
Lymphocytes Relative: 39 %
MCH: 31.6 pg (ref 26.0–34.0)
MCH: 31.3 pg (ref 27.2–33.4)
MCHC: 35.4 g/dL (ref 30.0–36.0)
MCHC: 33.2 g/dL (ref 32.0–36.0)
MCV: 89.4 fL (ref 78.0–100.0)
MCV: 94.4 fL (ref 79.3–98.0)
MONO ABS: 0.7 10*3/uL (ref 0.1–1.0)
MONO#: 0.9 10*3/uL (ref 0.1–0.9)
MONO%: 11 % (ref 0.0–14.0)
MONOS PCT: 12 %
NEUT%: 66.4 % (ref 39.0–75.0)
NEUTROS ABS: 2.5 10*3/uL (ref 1.7–7.7)
NEUTROS ABS: 5.6 10*3/uL (ref 1.5–6.5)
NEUTROS PCT: 47 %
PLATELETS: 179 10*3/uL (ref 140–400)
Platelets: 192 10*3/uL (ref 150–400)
RBC: 4.24 MIL/uL (ref 4.22–5.81)
RBC: 4.5 10*6/uL (ref 4.20–5.82)
RDW: 13.3 % (ref 11.5–15.5)
RDW: 14 % (ref 11.0–14.6)
WBC: 5.4 10*3/uL (ref 4.0–10.5)
WBC: 8.5 10*3/uL (ref 4.0–10.3)

## 2015-12-14 LAB — BASIC METABOLIC PANEL
Anion gap: 10 (ref 5–15)
BUN: 13 mg/dL (ref 6–20)
CALCIUM: 9 mg/dL (ref 8.9–10.3)
CHLORIDE: 106 mmol/L (ref 101–111)
CO2: 23 mmol/L (ref 22–32)
CREATININE: 1.12 mg/dL (ref 0.61–1.24)
GFR calc non Af Amer: >60 mL/min (ref >60)
Glucose, Bld: 111 mg/dL — ABNORMAL HIGH (ref 65–99)
Potassium: 4.4 mmol/L (ref 3.5–5.1)
Sodium: 139 mmol/L (ref 135–145)

## 2015-12-14 LAB — COMPREHENSIVE METABOLIC PANEL
ALBUMIN: 3.7 g/dL (ref 3.5–5.0)
ALK PHOS: 89 U/L (ref 40–150)
ALT: 22 U/L (ref 0–55)
ANION GAP: 8 meq/L (ref 3–11)
AST: 16 U/L (ref 5–34)
BILIRUBIN TOTAL: 0.96 mg/dL (ref 0.20–1.20)
BUN: 11 mg/dL (ref 7.0–26.0)
CO2: 25 meq/L (ref 22–29)
CREATININE: 1 mg/dL (ref 0.7–1.3)
Calcium: 9.2 mg/dL (ref 8.4–10.4)
Chloride: 106 mEq/L (ref 98–109)
EGFR: >90 mL/min/{1.73_m2} (ref >90)
GLUCOSE: 109 mg/dL (ref 70–140)
Potassium: 3.9 mEq/L (ref 3.5–5.1)
Sodium: 138 mEq/L (ref 136–145)
TOTAL PROTEIN: 6.4 g/dL (ref 6.4–8.3)

## 2015-12-14 LAB — URINALYSIS, ROUTINE W REFLEX MICROSCOPIC
Bilirubin Urine: NEGATIVE
Glucose, UA: NEGATIVE mg/dL
HGB URINE DIPSTICK: NEGATIVE
Ketones, ur: NEGATIVE mg/dL
LEUKOCYTES UA: NEGATIVE
NITRITE: NEGATIVE
PROTEIN: NEGATIVE mg/dL
SPECIFIC GRAVITY, URINE: 1.024 (ref 1.005–1.030)
pH: 6.5 (ref 5.0–8.0)

## 2015-12-14 MED ORDER — PANTOPRAZOLE SODIUM 40 MG PO TBEC: 40.0000 mg | DELAYED_RELEASE_TABLET | Freq: Every day | ORAL | Status: DC

## 2015-12-14 MED ORDER — PROCHLORPERAZINE MALEATE 10 MG PO TABS
10.0000 mg | ORAL_TABLET | Freq: Once | ORAL | Status: AC
  Administered 2015-12-14: 10 mg via ORAL

## 2015-12-14 MED ORDER — CEPHALEXIN 500 MG PO CAPS: 500.0000 mg | ORAL_CAPSULE | Freq: Two times a day (BID) | ORAL | Status: DC

## 2015-12-14 MED ORDER — MORPHINE SULFATE (PF) 4 MG/ML IV SOLN
4.0000 mg | Freq: Once | INTRAVENOUS | Status: AC
  Administered 2015-12-14: 4 mg via INTRAVENOUS
  Filled 2015-12-14: qty 1

## 2015-12-14 MED ORDER — PHENYLEPHRINE 200 MCG/ML FOR PRIAPISM / HYPOTENSION
50.0000 ug | INTRAMUSCULAR | Status: DC | PRN
  Filled 2015-12-14: qty 50

## 2015-12-14 MED ORDER — PROCHLORPERAZINE MALEATE 10 MG PO TABS
ORAL_TABLET | ORAL | Status: AC
  Filled 2015-12-14: qty 1

## 2015-12-14 MED ORDER — BORTEZOMIB CHEMO SQ INJECTION 3.5 MG (2.5MG/ML)
1.3000 mg/m2 | Freq: Once | INTRAMUSCULAR | Status: AC
  Administered 2015-12-14: 2.5 mg via SUBCUTANEOUS
  Filled 2015-12-14: qty 2.5

## 2015-12-14 MED ORDER — MORPHINE SULFATE (PF) 4 MG/ML IV SOLN
4.0000 mg | Freq: Once | INTRAVENOUS | Status: AC
Start: 2015-12-14 — End: 2015-12-14
  Administered 2015-12-14: 4 mg via INTRAVENOUS
  Filled 2015-12-14: qty 1

## 2015-12-14 MED FILL — PANTOPRAZOLE SOD DR 40 MG T: 40 | 30 days supply | Qty: 30 | Fill #0

## 2015-12-14 MED FILL — CEPHALEXIN 500 MG CAPSULE: 500 | 10 days supply | Qty: 20 | Fill #0

## 2015-12-14 NOTE — Assessment & Plan Note (Signed)
The patient is adamant that he does not want to take narcotic therapy. He is currently taking tramadol as needed. We'll continue the same He is on calcium and vitamin D supplement

## 2015-12-14 NOTE — Patient Instructions (Signed)
[facility] Discharge Instructions for Patients Receiving Chemotherapy  Today you received the following chemotherapy agents: Velcade.  To help prevent nausea and vomiting after your treatment, we encourage you to take your nausea medication: Compazine 10 mg every 6 hours as needed; zofran 8 mg every 8 hours as needed.   If you develop nausea and vomiting that is not controlled by your nausea medication, call the clinic.   BELOW ARE SYMPTOMS THAT SHOULD BE REPORTED IMMEDIATELY:  *FEVER GREATER THAN 100.5 F  *CHILLS WITH OR WITHOUT FEVER  NAUSEA AND VOMITING THAT IS NOT CONTROLLED WITH YOUR NAUSEA MEDICATION  *UNUSUAL SHORTNESS OF BREATH  *UNUSUAL BRUISING OR BLEEDING  TENDERNESS IN MOUTH AND THROAT WITH OR WITHOUT PRESENCE OF ULCERS  *URINARY PROBLEMS  *BOWEL PROBLEMS  UNUSUAL RASH Items with * indicate a potential emergency and should be followed up as soon as possible.  Feel free to call the clinic you have any questions or concerns. The clinic phone number is [phone].  Please show the [place] at check-in to the Emergency Department and triage nurse.

## 2015-12-14 NOTE — Assessment & Plan Note (Signed)
He had mild macular rash of unknown etiology. Certainly could be due to Revlimid but other causes cannot be excluded. It has improved. I recommend he resume taking Revlimid until 12/20/2015 to complete cycle 5 and to hold further refills until his transplant evaluation.

## 2015-12-14 NOTE — Assessment & Plan Note (Signed)
He is tolerating treatment well. Recent blood work show greater than 90% drop in serum light chain. Overall, his pain control is improving. We are waiting for appointment on 12/30/2015 for discussion regarding the role of stem cell transplant. I plan to see him back at the end of month for further review  If the plan is to proceed straight to stem cell transplant, I will cancel cycle 6 of treatment He has not received Zometa, pending dental evaluation I have tapered him off dexamethasone last week. He will continue aspirin for DVT prophylaxis and acyclovir for antimicrobial prophylaxis I reinforced the importance of calcium and vitamin D supplement

## 2015-12-14 NOTE — Consult Note (Signed)
9:42 AM   Erik Nguyen [birth year] [id number]  Referring provider: Dr. Dollene Cleveland  Chief Complaint  Patient presents with  . Groin Pain    HPI: The patient is a 60 year old gentleman with a past history of multiple myeloma and priapism who presents to the ER with priapism. He noted that it began at 8 PM last night. He waited to present to the ER until this morning. He was injected 2 with 200 g of phenylephrine with minimal improvement, serology was consulted for further recommendations. The patient denies any recent cocaine use. 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 He denies any history of sickle cell disease as well. He's had priapism multiple times in the past. Most recently was 1 year ago. This required both phenylephrine injection as well as corporal irrigation. He does note that he notices priapism after taking Benadryl today.   PMH: Past Medical History  Diagnosis Date  . Priapism 2014   . Hypertension Dx 2016  . Multiple myeloma not having achieved remission ([place]) 09/01/2015    Surgical History: Past Surgical History  Procedure Laterality Date  . Colonoscopy    . Polypectomy      Home Medications:    Medication List    ASK your doctor about these medications        acyclovir 400 MG tablet  Commonly known as:  ZOVIRAX  Take 1 tablet (400 mg total) by mouth 2 (two) times daily.     aspirin EC 81 MG tablet  Take 81 mg by mouth daily.     bisacodyl 5 MG EC tablet  Commonly known as:  DULCOLAX  Take 5 mg by mouth 2 (two) times daily as needed for moderate constipation.     CALCIUM PO  Take 1 tablet by mouth daily.     cholecalciferol 1000 units tablet  Commonly known as:  VITAMIN D  Take 2,000 Units by mouth daily.     dexamethasone 4 MG tablet  Commonly known as:  DECADRON  Take 5 tablets (20 mg) weekly on Mondays and Thursdays     diclofenac sodium 1 % Gel  Commonly known as:  VOLTAREN  Apply 2 g topically 4 (four) times daily.     diphenhydrAMINE 25 MG tablet  Commonly known as:  BENADRYL  Take 25 mg by mouth 2 (two) times daily as needed for allergies.     lenalidomide 10 MG capsule  Commonly known as:  REVLIMID  Take 1 capsule (10 mg total) by mouth daily. For 14 days on and 7 days off     lisinopril-hydrochlorothiazide 10-12.5 MG tablet  Commonly known as:  PRINZIDE,ZESTORETIC  Take 1 tablet by mouth daily. Reported on 10/06/2015     ondansetron 8 MG tablet  Commonly known as:  ZOFRAN  Take 1 tablet (8 mg total) by mouth every 8 (eight) hours as needed (Nausea or vomiting).     pantoprazole 40 MG tablet  Commonly known as:  PROTONIX  Take 1 tablet (40 mg total) by mouth daily.     polyethylene glycol packet  Commonly known as:  MIRALAX / GLYCOLAX  Take 17 g by mouth 2 (two) times daily.     prochlorperazine 10 MG tablet  Commonly known as:  COMPAZINE  Take 1 tablet (10 mg total) by mouth every 6 (six) hours as needed (Nausea or vomiting).     senna-docusate 8.6-50 MG tablet  Commonly known as:  Senokot-S  Take 1 tablet by mouth 2 (two)  times daily.     traMADol 50 MG tablet  Commonly known as:  ULTRAM  Take one to two tablets every 6 hrs as needed for pain.        Allergies:  Allergies  Allergen Reactions  . Latex Itching and Rash    Family History: Family History  Problem Relation Age of Onset  . Asthma Mother   . Cancer Father   . Prostate cancer Father   . Colon cancer Neg Hx   . Rectal cancer Neg Hx   . Stomach cancer Neg Hx     Social History:  reports that he has quit smoking. His smoking use included Cigarettes. He smoked 1.00 pack per day. He has never used smokeless tobacco. He reports that he does not drink alcohol or use illicit drugs.  ROS: 12 point ROS negative except per HPI                                        Physical Exam: BP 178/109 mmHg  Pulse 83  Temp(Src) 99.2 F (37.3 C) (Oral)  Resp 20  Ht 5' 6"  (1.676 m)  Wt 170 lb (77.111  kg)  BMI 27.45 kg/m2  SpO2 95%  Constitutional:  Alert and oriented, No acute distress. HEENT:  AT, moist mucus membranes.  Trachea midline, no masses. Cardiovascular: No clubbing, cyanosis, or edema. Respiratory: Normal respiratory effort, no increased work of breathing. GI: Abdomen is soft, nontender, nondistended, no abdominal masses GU: No CVA tenderness.  Skin: No rashes, bruises or suspicious lesions. Lymph: No cervical or inguinal adenopathy. Neurologic: Grossly intact, no focal deficits, moving all 4 extremities. Psychiatric: Normal mood and affect.  Laboratory Data: Lab Results  Component Value Date   WBC 5.4 12/14/2015   HGB 13.4 12/14/2015   HCT 37.9* 12/14/2015   MCV 89.4 12/14/2015   PLT 192 12/14/2015    Lab Results  Component Value Date   CREATININE 1.12 12/14/2015    Lab Results  Component Value Date   PSA 1.58 11/06/2014    No results found for: TESTOSTERONE  Lab Results  Component Value Date   HGBA1C 5.80 10/06/2015    Urinalysis    Component Value Date/Time   COLORURINE YELLOW 12/14/2015 0830   APPEARANCEUR CLEAR 12/14/2015 0830   LABSPEC 1.024 12/14/2015 0830   PHURINE 6.5 12/14/2015 0830   GLUCOSEU NEGATIVE 12/14/2015 0830   HGBUR NEGATIVE 12/14/2015 0830   BILIRUBINUR NEGATIVE 12/14/2015 0830   KETONESUR NEGATIVE 12/14/2015 0830   PROTEINUR NEGATIVE 12/14/2015 0830   UROBILINOGEN 1.0 12/28/2014 2005   NITRITE NEGATIVE 12/14/2015 0830   LEUKOCYTESUR NEGATIVE 12/14/2015 0830   Procedure: The patient's right corpora was cleaned with alcohol prep pad. 200 g of phenylephrine were injected at the 3:00 position. After approximately 3 minutes, he had resolution of his priapism. His pain resolved. He was monitored with cardiac monitor during this procedure without incident.   Assessment & Plan:    1. Priapism -Resolved -Okay for discharge home on the medication and Keflex 3 days. -Follow in 1 week with urology.  Nickie Retort,  MD

## 2015-12-14 NOTE — ED Notes (Signed)
Pt provided with urinal

## 2015-12-14 NOTE — Discharge Instructions (Signed)
Take the prescribed medication as directed to help prevent infection. Follow-up with urology next week-- call to make appt. Return to the ED for new or worsening symptoms or if you have recurrent priapism.  Priapism Priapism is an unwanted erection of the penis that usually develops without sexual stimulation or desire. Priapism affects males of all ages. There are three types of priapism:  Recurrent acute priapism. With this type, erections are painful and last less than 3 hours. The erections come and go.  Acute prolonged priapism. With this type, erections are painful and last hours to days. This type can lead to erectile dysfunction.  Persistent priapism. With this type, erections are usually painless and can last weeks to years. The penis gets erect but not rigid. This type can lead to erectile dysfunction. CAUSES This condition develops either when blood has difficulty leaving the penis (low-flow priapism) or if too much blood flows into the penis (high-flow priapism). Blood flow issues may be caused by:  Erectile dysfunction medicine. This is the most common cause.  Medicine that is used to treat depression and anxiety.  Blood problems that are common in people who have sickle cell disease or leukemia.  Use of illegal drugs, such as cocaine and marijuana.  Excessive alcohol use.  Neurological problems, such as multiple sclerosis.  Diabetes mellitus.  An injury to the penis.  An infection. In some cases, the cause may not be known. SYMPTOMS Symptoms of this condition include:  A prolonged erection.  A painful erection. DIAGNOSIS This condition is diagnosed with a physical exam. Blood tests may be done to help identify the cause of the condition. TREATMENT Treatment for this condition depends on the cause and the type of priapism. Recurrent acute priapism is often managed at home. Acute prolonged priapism is usually treated at a hospital, where treatment may  involve:  Getting fluid and medicines for pain through an IV tube.  A blood transfusion.  A procedure to drain blood from the penis.  Surgery to make a passageway for blood to flow in the penis (surgical shunting). No standard treatment exists for persistent priapism. HOME CARE INSTRUCTIONS Managing Recurrent Priapism  Try taking a warm bath or exercising.  Keep track of how long your erection lasts. If it does not go away in 3 hours, seek medical care. General Instructions  Avoid sexual stimulation and intercourse until your health care provider says that they are okay for you.  Avoid drugs or alcohol if they caused the priapism. Avoiding them can help to prevent the condition from coming back.  Drink enough fluid to keep your urine clear or pale yellow.  Empty your bladder as much as possible.  Take over-the-counter and prescription medicines only as told by your health care provider.  Do not take any medicines during an episode unless you get approval from your health care provider.  Keep all follow-up visits as told by your health care provider. This is important. SEEK MEDICAL CARE IF:  Your pain gets worse.  Your pain does not improve with treatment.  You have recurrent priapism and your erection does not go away in 3 hours. SEEK IMMEDIATE MEDICAL CARE IF:  You have a fever or chills.  You have pain, swelling, or redness in your genitals or your groin area.   This information is not intended to replace advice given to you by your health care provider. Make sure you discuss any questions you have with your health care provider.   Document Released:  10/22/2003 Document Revised: 04/22/2015 Document Reviewed: 11/04/2014 Elsevier Interactive Patient Education [employer].

## 2015-12-14 NOTE — Telephone Encounter (Signed)
Per staff message and POF I have scheduled appts. Advised scheduler of appts. JMW  

## 2015-12-14 NOTE — ED Notes (Signed)
Patient was diagnosed with Priapism in 2014. Patient states that this is what happened last time. Patient states he is having groin pain.

## 2015-12-14 NOTE — ED Notes (Signed)
Urologist at bedside.

## 2015-12-14 NOTE — Telephone Encounter (Signed)
per pof to sch [pt appt-sent MW email to sch trmt-gave pt time & date of appt

## 2015-12-14 NOTE — Assessment & Plan Note (Signed)
The patient developed reaction to Benadryl recently with priapism. It has since resolved. He was prescribed antibiotic therapy and I recommend he takes it as directed. I have entered Benadryl as a potential cause of priapism and if he develops allergic reaction in the future, I would advocate the use of steroids or topical Benadryl only

## 2015-12-14 NOTE — ED Provider Notes (Signed)
CSN: [id number]     Arrival date & time 12/14/15  0506 History   First MD Initiated Contact with Patient 12/14/15 [phone]     Chief Complaint  Patient presents with  . Groin Pain     (Consider location/radiation/quality/duration/timing/severity/associated sxs/prior Treatment) Patient is a 60 y.o. male presenting with groin pain. The history is provided by the patient and medical records.  Groin Pain   60 y.o. M with hx of priapism, HTN, multiple myeloma, presenting to the ED for priapism, presenting to the ED for priapism.  He reports history of the same starting in 2014.  He states current episode began last night around 2000 last night.  He states it has not improved at all.  He states he is still able to urinate, however it is difficulty.  Denies hematuria or dysuria.  No fever, chills, sweats.  He denies any recent alcohol use or illicit drug use.  He was previously on trazadone, does not appear to be on any anti-depressants currently.  Denies use of any erectile dysfunction medications.  No hx of sickle cell disease.  He states he did take some benadryl last night because he felt he was getting a rash on his back, he is concerned that has caused this.  No intervention tried PTA.  States his last visit to ED for priapism was over a year ago.  He has had a few recurrence since this time but they have resolved on their own.  He states he waited so long to come in today because he was hoping it would resolve while he slept.  Past Medical History  Diagnosis Date  . Priapism 2014   . Hypertension Dx 2016  . Multiple myeloma not having achieved remission ([place]) 09/01/2015   Past Surgical History  Procedure Laterality Date  . Colonoscopy    . Polypectomy     Family History  Problem Relation Age of Onset  . Asthma Mother   . Cancer Father   . Prostate cancer Father   . Colon cancer Neg Hx   . Rectal cancer Neg Hx   . Stomach cancer Neg Hx    Social History  Substance Use Topics  . Smoking  status: Former Smoker -- 1.00 packs/day    Types: Cigarettes  . Smokeless tobacco: Never Used  . Alcohol Use: No     Comment: last use Jan 2015    Review of Systems  Genitourinary: Positive for penile pain.       + priapism  All other systems reviewed and are negative.     Allergies  Latex  Home Medications   Prior to Admission medications   Medication Sig Start Date End Date Taking? Authorizing Provider  acyclovir (ZOVIRAX) 400 MG tablet Take 1 tablet (400 mg total) by mouth 2 (two) times daily. 10/05/15   Heath Lark, MD  aspirin EC 81 MG tablet Take 81 mg by mouth daily.    Historical Provider, MD  cholecalciferol (VITAMIN D) 1000 units tablet Take 2,000 Units by mouth daily.    Historical Provider, MD  dexamethasone (DECADRON) 4 MG tablet Take 5 tablets (20 mg) weekly on Mondays and Thursdays 10/26/15   Heath Lark, MD  diclofenac sodium (VOLTAREN) 1 % GEL Apply 2 g topically 4 (four) times daily. 10/26/15   Heath Lark, MD  lenalidomide (REVLIMID) 10 MG capsule Take 1 capsule (10 mg total) by mouth daily. For 14 days on and 7 days off 11/23/15   Heath Lark, MD  lisinopril-hydrochlorothiazide Reita May)  10-12.5 MG tablet Take 1 tablet by mouth daily. Reported on 10/06/2015 10/06/15   Boykin Nearing, MD  ondansetron (ZOFRAN) 8 MG tablet Take 1 tablet (8 mg total) by mouth every 8 (eight) hours as needed (Nausea or vomiting). 09/02/15   Heath Lark, MD  pantoprazole (PROTONIX) 40 MG tablet Take 1 tablet (40 mg total) by mouth daily. 10/26/15   Heath Lark, MD  polyethylene glycol (MIRALAX / GLYCOLAX) packet Take 17 g by mouth 2 (two) times daily. 09/16/15   Eugenie Filler, MD  prochlorperazine (COMPAZINE) 10 MG tablet Take 1 tablet (10 mg total) by mouth every 6 (six) hours as needed (Nausea or vomiting). 09/02/15   Heath Lark, MD  senna-docusate (SENOKOT-S) 8.6-50 MG tablet Take 1 tablet by mouth 2 (two) times daily. 10/12/15   Heath Lark, MD  traMADol (ULTRAM) 50 MG tablet Take  one to two tablets every 6 hrs as needed for pain. 11/23/15   Ni Gorsuch, MD   BP 167/103 mmHg  Pulse 84  Temp(Src) 99.2 F (37.3 C) (Oral)  Resp 22  Ht 5' 6"  (1.676 m)  Wt 77.111 kg  BMI 27.45 kg/m2  SpO2 97%   Physical Exam  Constitutional: He is oriented to person, place, and time. He appears well-developed and well-nourished. No distress.  Appears uncomfortable  HENT:  Head: Normocephalic and atraumatic.  Mouth/Throat: Oropharynx is clear and moist.  Eyes: Conjunctivae and EOM are normal. Pupils are equal, round, and reactive to light.  Neck: Normal range of motion. Neck supple.  Cardiovascular: Normal rate, regular rhythm and normal heart sounds.   Pulmonary/Chest: Effort normal and breath sounds normal. No respiratory distress. He has no wheezes.  Abdominal: Soft. Bowel sounds are normal. There is no tenderness. There is no guarding.  Genitourinary: Circumcised.  Erect penis; tenderness with palpation noted No scrotal swelling or tenderness  Musculoskeletal: Normal range of motion. He exhibits no edema.  Neurological: He is alert and oriented to person, place, and time.  Skin: Skin is warm and dry. He is not diaphoretic.  Psychiatric: He has a normal mood and affect.  Nursing note and vitals reviewed.   ED Course  Procedures (including critical care time)  PRIAPISM TREATMENT (injection) Patient was prepped and draped in standard sterile fashion Just prior to procedure a timeout was performed Left corpus cavernosum was entered with a 25-gauge syringe Blood was aspirated 1 cc of 200 mcg/ml phenylephrine was injected at 0720 The patient tolerated the procedure No complications  PRIAPISM TREATMENT (injection) Patient was prepped and draped in standard sterile fashion Just prior to procedure a timeout was performed Right corpus cavernosum was entered with a 25-gauge syringe Blood was aspirated 1 cc of 200 mcg/ml phenylephrine was injected at 0800 The patient  tolerated the procedure No complications   Labs Review Labs Reviewed  CBC WITH DIFFERENTIAL/PLATELET - Abnormal; Notable for the following:    HCT 37.9 (*)    All other components within normal limits  BASIC METABOLIC PANEL - Abnormal; Notable for the following:    Glucose, Bld 111 (*)    All other components within normal limits  URINALYSIS, ROUTINE W REFLEX MICROSCOPIC (NOT AT [facility])    Imaging Review No results found. I have personally reviewed and evaluated these images and lab results as part of my medical decision-making.   EKG Interpretation None      MDM   Final diagnoses:  Priapism   60 y.o. M here with priapism since 2000 last night, hx of same.  Denies EtOH, street drugs, anti-depressants, or erectile dysfunction medications.  No hx of sickle cell anemia.  On exam patient appears very uncomfortable, penis is erect with tenderness to palpation and pain with any movement.  No abdominal pain or testicle pain.  Basic labs appear baseline.  U/a pending.  0720 patient was injected with 1cc phenylephrine in left corpus cavernosum. He tolerated well.  Will monitor for improvement.  Given additional pain medications.  7:47 AM On repeat evalutation, priapism is mildly improved, patient still very uncomfortable.  Patient reports some relief of pain but not completely.  Will monitor for a little while longer.  If no resolution soon, will need repeat injection.  8:02 AM Priapism remains.  Patient still reports significant pain  Second injection given into right corpus cavernorosum, again tolerated well.  If no improvement shortly will need to contact urology.  8:31 AM Priapism remains.  Will consult urology.  9:12 AM Urology to come evaluate in the ED.  Supplies requested placed at bedside.  Urology has evaluated patient, injected phenylephrine for the 3rd time with complete detumescence.  Patient had significant relief.  BP has been elevated here, has not yet taken his home  meds but will take upon returning home.  Urology recommends keflex for home and follow-up in clinic next week.  Discussed plan with patient, he/she acknowledged understanding and agreed with plan of care.  Return precautions given for new or worsening symptoms.  Larene Pickett, PA-C 12/14/15 [place], MD 12/15/15 [phone]

## 2015-12-14 NOTE — Assessment & Plan Note (Signed)
he has mild peripheral neuropathy, likely related to side effects of treatment. It is only mild, not bothering the patient. I will observe for now If it gets worse in the future, I will consider modifying the dose of the treatment  

## 2015-12-14 NOTE — Assessment & Plan Note (Signed)
Recent CT scan show evidence of esophagitis. I recommend he takes Protonix and refilled his medications today

## 2015-12-14 NOTE — ED Notes (Signed)
Awake. Verbally responsive. A/O x4. Resp even and unlabored. No audible adventitious breath sounds noted. ABC's intact.  

## 2015-12-14 NOTE — Progress Notes (Signed)
[place] OFFICE PROGRESS NOTE  Patient Care Team: Boykin Nearing, MD as PCP - General (Family Medicine) Reola Calkins, MD as Consulting Physician (Hematology and Oncology)  SUMMARY OF ONCOLOGIC HISTORY:   Multiple myeloma in remission [facility])   08/28/2015 Bone Marrow Biopsy Accession: [id number] biopsy showed 58% myeloma involvement. Cytogenetics was normal and FISH showed Del 13q and t(11;14).    09/07/2015 -  Chemotherapy he was started on combined treatment of Velcade and Revlimid with Dex   09/12/2015 - 09/16/2015 Hospital Admission He was admitted to the hospital with sepsis, treatment was placed on hold and he received one dose of GCSF for leukopenia    INTERVAL HISTORY: Please see below for problem oriented charting.  He had recent rash and was recommended to take Benadryl. He developed acute reaction with priapism, resolved spontaneously The rash has improved He continues to have chronic intermittent bone pain He complained of very mild peripheral neuropathy at the tip of fingers which he described as numbness but not painful, not on his toes and leg cramps on and off He complained of leg swelling on an intermittent basis  REVIEW OF SYSTEMS:   Constitutional: Denies fevers, chills or abnormal weight loss Eyes: Denies blurriness of vision Ears, nose, mouth, throat, and face: Denies mucositis or sore throat Respiratory: Denies cough, dyspnea or wheezes Cardiovascular: Denies palpitation, chest discomfort  Gastrointestinal:  Denies nausea, heartburn or change in bowel habits Lymphatics: Denies new lymphadenopathy or easy bruising Behavioral/Psych: Mood is stable, no new changes  All other systems were reviewed with the patient and are negative.  I have reviewed the past medical history, past surgical history, social history and family history with the patient and they are unchanged from previous note.  ALLERGIES:  is allergic to benadryl and  latex.  MEDICATIONS:  Current Outpatient Prescriptions  Medication Sig Dispense Refill  . acyclovir (ZOVIRAX) 400 MG tablet Take 1 tablet (400 mg total) by mouth 2 (two) times daily. 60 tablet 3  . aspirin EC 81 MG tablet Take 81 mg by mouth daily.    . bisacodyl (DULCOLAX) 5 MG EC tablet Take 5 mg by mouth 2 (two) times daily as needed for moderate constipation.    Marland Kitchen CALCIUM PO Take 1 tablet by mouth daily.    . cholecalciferol (VITAMIN D) 1000 units tablet Take 2,000 Units by mouth daily.    . diclofenac sodium (VOLTAREN) 1 % GEL Apply 2 g topically 4 (four) times daily. (Patient taking differently: Apply 2 g topically 4 (four) times daily as needed (pain). ) 100 g 2  . lisinopril-hydrochlorothiazide (PRINZIDE,ZESTORETIC) 10-12.5 MG tablet Take 1 tablet by mouth daily. Reported on 10/06/2015 30 tablet 5  . ondansetron (ZOFRAN) 8 MG tablet Take 1 tablet (8 mg total) by mouth every 8 (eight) hours as needed (Nausea or vomiting). 30 tablet 1  . traMADol (ULTRAM) 50 MG tablet Take one to two tablets every 6 hrs as needed for pain. 90 tablet 0  . cephALEXin (KEFLEX) 500 MG capsule Take 1 capsule (500 mg total) by mouth 2 (two) times daily. (Patient not taking: Reported on 12/14/2015) 20 capsule 0  . lenalidomide (REVLIMID) 10 MG capsule Take 1 capsule (10 mg total) by mouth daily. For 14 days on and 7 days off (Patient not taking: Reported on 12/14/2015) 14 capsule 0  . pantoprazole (PROTONIX) 40 MG tablet Take 1 tablet (40 mg total) by mouth daily. 30 tablet 6  . polyethylene glycol (MIRALAX / GLYCOLAX) packet Take  17 g by mouth 2 (two) times daily. (Patient not taking: Reported on 12/14/2015) 14 each 0  . prochlorperazine (COMPAZINE) 10 MG tablet Take 1 tablet (10 mg total) by mouth every 6 (six) hours as needed (Nausea or vomiting). (Patient not taking: Reported on 12/14/2015) 30 tablet 1  . senna-docusate (SENOKOT-S) 8.6-50 MG tablet Take 1 tablet by mouth 2 (two) times daily. (Patient not taking:  Reported on 12/14/2015) 60 tablet 3   No current facility-administered medications for this visit.    PHYSICAL EXAMINATION: ECOG PERFORMANCE STATUS: 1 - Symptomatic but completely ambulatory  Filed Vitals:   12/14/15 1114  BP: 156/93  Pulse: 84  Temp: 98.4 F (36.9 C)  Resp: 18   Filed Weights   12/14/15 1114  Weight: 173 lb 3.2 oz (78.563 kg)    GENERAL:alert, no distress and comfortable SKIN: He has very fine macular rash on his torso. EYES: normal, Conjunctiva are pink and non-injected, sclera clear OROPHARYNX:no exudate, no erythema and lips, buccal mucosa, and tongue normal . Poor dentition is noted Musculoskeletal:no cyanosis of digits and no clubbing  NEURO: alert & oriented x 3 with fluent speech, no focal motor/sensory deficits  LABORATORY DATA:  I have reviewed the data as listed    Component Value Date/Time   NA 138 12/14/2015 1103   NA 139 12/14/2015 0631   K 3.9 12/14/2015 1103   K 4.4 12/14/2015 0631   CL 106 12/14/2015 0631   CO2 25 12/14/2015 1103   CO2 23 12/14/2015 0631   GLUCOSE 109 12/14/2015 1103   GLUCOSE 111* 12/14/2015 0631   BUN 11.0 12/14/2015 1103   BUN 13 12/14/2015 0631   CREATININE 1.0 12/14/2015 1103   CREATININE 1.12 12/14/2015 0631   CREATININE 1.13 07/24/2015 1155   CALCIUM 9.2 12/14/2015 1103   CALCIUM 9.0 12/14/2015 0631   PROT 6.4 12/14/2015 1103   PROT 6.2 11/16/2015 0956   PROT 6.1* 09/14/2015 0440   ALBUMIN 3.7 12/14/2015 1103   ALBUMIN 3.5 09/14/2015 0440   AST 16 12/14/2015 1103   AST 33 09/14/2015 0440   ALT 22 12/14/2015 1103   ALT 37 09/14/2015 0440   ALKPHOS 89 12/14/2015 1103   ALKPHOS 47 09/14/2015 0440   BILITOT 0.96 12/14/2015 1103   BILITOT 0.4 09/14/2015 0440   GFRNONAA >60 12/14/2015 0631   GFRNONAA 71 07/24/2015 1155   GFRAA >60 12/14/2015 0631   GFRAA 82 07/24/2015 1155    No results found for: SPEP, UPEP  Lab Results  Component Value Date   WBC 8.5 12/14/2015   NEUTROABS 5.6 12/14/2015   HGB  14.1 12/14/2015   HCT 42.4 12/14/2015   MCV 94.4 12/14/2015   PLT 179 12/14/2015      Chemistry      Component Value Date/Time   NA 138 12/14/2015 1103   NA 139 12/14/2015 0631   K 3.9 12/14/2015 1103   K 4.4 12/14/2015 0631   CL 106 12/14/2015 0631   CO2 25 12/14/2015 1103   CO2 23 12/14/2015 0631   BUN 11.0 12/14/2015 1103   BUN 13 12/14/2015 0631   CREATININE 1.0 12/14/2015 1103   CREATININE 1.12 12/14/2015 0631   CREATININE 1.13 07/24/2015 1155      Component Value Date/Time   CALCIUM 9.2 12/14/2015 1103   CALCIUM 9.0 12/14/2015 0631   ALKPHOS 89 12/14/2015 1103   ALKPHOS 47 09/14/2015 0440   AST 16 12/14/2015 1103   AST 33 09/14/2015 0440   ALT 22 12/14/2015 1103  ALT 37 09/14/2015 0440   BILITOT 0.96 12/14/2015 1103   BILITOT 0.4 09/14/2015 0440      ASSESSMENT & PLAN:  Multiple myeloma in remission ([place]) He is tolerating treatment well. Recent blood work show greater than 90% drop in serum light chain. Overall, his pain control is improving. We are waiting for appointment on 12/30/2015 for discussion regarding the role of stem cell transplant. I plan to see him back at the end of month for further review  If the plan is to proceed straight to stem cell transplant, I will cancel cycle 6 of treatment He has not received Zometa, pending dental evaluation I have tapered him off dexamethasone last week. He will continue aspirin for DVT prophylaxis and acyclovir for antimicrobial prophylaxis I reinforced the importance of calcium and vitamin D supplement  Priapism The patient developed reaction to Benadryl recently with priapism. It has since resolved. He was prescribed antibiotic therapy and I recommend he takes it as directed. I have entered Benadryl as a potential cause of priapism and if he develops allergic reaction in the future, I would advocate the use of steroids or topical Benadryl only  Bone pain  The patient is adamant that he does not want to take  narcotic therapy. He is currently taking tramadol as needed. We'll continue the same He is on calcium and vitamin D supplement  Esophagitis  Recent CT scan show evidence of esophagitis. I recommend he takes Protonix and refilled his medications today  Skin rash He had mild macular rash of unknown etiology. Certainly could be due to Revlimid but other causes cannot be excluded. It has improved. I recommend he resume taking Revlimid until 12/20/2015 to complete cycle 5 and to hold further refills until his transplant evaluation.  Peripheral neuropathy due to chemotherapy [facility]) he has mild peripheral neuropathy, likely related to side effects of treatment. It is only mild, not bothering the patient. I will observe for now If it gets worse in the future, I will consider modifying the dose of the treatment     No orders of the defined types were placed in this encounter.   All questions were answered. The patient knows to call the clinic with any problems, questions or concerns. No barriers to learning was detected. I spent 25 minutes counseling the patient face to face. The total time spent in the appointment was 40 minutes and more than 50% was on counseling and review of test results     [facility], [place], MD 12/14/2015 11:51 AM

## 2015-12-15 LAB — KAPPA/LAMBDA LIGHT CHAINS
Ig Kappa Free Light Chain: 9.64 mg/L (ref 3.30–19.40)
Ig Lambda Free Light Chain: 436.34 mg/L — ABNORMAL HIGH (ref 5.71–26.30)
Kappa/Lambda FluidC Ratio: 0.02 — ABNORMAL LOW (ref 0.26–1.65)

## 2015-12-16 MED FILL — LISINOPRIL-HCTZ 10-12.5 MG: 10-12.5 | 30 days supply | Qty: 30 | Fill #1

## 2015-12-16 NOTE — Telephone Encounter (Signed)
Pt stated received letter form [place] division [employer]  Stated will received assistant form them with his medication

## 2015-12-17 ENCOUNTER — Ambulatory Visit (HOSPITAL_BASED_OUTPATIENT_CLINIC_OR_DEPARTMENT_OTHER): Payer: No Typology Code available for payment source

## 2015-12-17 VITALS — BP 135/90 | HR 83 | Temp 98.7°F | Resp 17

## 2015-12-17 DIAGNOSIS — Z5112 Encounter for antineoplastic immunotherapy: Secondary | ICD-10-CM

## 2015-12-17 DIAGNOSIS — R803 Bence Jones proteinuria: Secondary | ICD-10-CM

## 2015-12-17 DIAGNOSIS — C9001 Multiple myeloma in remission: Secondary | ICD-10-CM

## 2015-12-17 MED ORDER — BORTEZOMIB CHEMO SQ INJECTION 3.5 MG (2.5MG/ML)
1.3000 mg/m2 | Freq: Once | INTRAMUSCULAR | Status: AC
  Administered 2015-12-17: 2.5 mg via SUBCUTANEOUS
  Filled 2015-12-17: qty 2.5

## 2015-12-17 MED ORDER — PROCHLORPERAZINE MALEATE 10 MG PO TABS
ORAL_TABLET | ORAL | Status: AC
  Filled 2015-12-17: qty 1

## 2015-12-17 MED ORDER — PROCHLORPERAZINE MALEATE 10 MG PO TABS
10.0000 mg | ORAL_TABLET | Freq: Once | ORAL | Status: AC
  Administered 2015-12-17: 10 mg via ORAL

## 2015-12-17 NOTE — Patient Instructions (Signed)
[facility] Discharge Instructions for Patients Receiving Chemotherapy  Today you received the following chemotherapy agents Velcade. To help prevent nausea and vomiting after your treatment, we encourage you to take your nausea medication as directed.  If you develop nausea and vomiting that is not controlled by your nausea medication, call the clinic.   BELOW ARE SYMPTOMS THAT SHOULD BE REPORTED IMMEDIATELY:  *FEVER GREATER THAN 100.5 F  *CHILLS WITH OR WITHOUT FEVER  NAUSEA AND VOMITING THAT IS NOT CONTROLLED WITH YOUR NAUSEA MEDICATION  *UNUSUAL SHORTNESS OF BREATH  *UNUSUAL BRUISING OR BLEEDING  TENDERNESS IN MOUTH AND THROAT WITH OR WITHOUT PRESENCE OF ULCERS  *URINARY PROBLEMS  *BOWEL PROBLEMS  UNUSUAL RASH Items with * indicate a potential emergency and should be followed up as soon as possible.  Feel free to call the clinic you have any questions or concerns. The clinic phone number is [phone].  Please show the CHEMO ALERT CARD at check-in to the Emergency Department and triage nurse.    

## 2015-12-24 ENCOUNTER — Other Ambulatory Visit: Payer: Self-pay | Admitting: Hematology and Oncology

## 2015-12-24 ENCOUNTER — Other Ambulatory Visit: Payer: Self-pay | Admitting: *Deleted

## 2015-12-24 MED FILL — DOC-Q-LAX TABLET: 8.6-50 | 50 days supply | Qty: 100 | Fill #1

## 2015-12-24 MED FILL — traMADol HCL 50 MG TABS: 50 | 11 days supply | Qty: 90 | Fill #0

## 2015-12-24 NOTE — Telephone Encounter (Signed)
Refill on Tramadol faxed to [facility] outpatient pharmacy.  Pt notified.

## 2016-01-01 ENCOUNTER — Other Ambulatory Visit: Payer: Self-pay | Admitting: Hematology and Oncology

## 2016-01-01 MED ORDER — LENALIDOMIDE 10 MG PO CAPS: 10.0000 mg | ORAL_CAPSULE | Freq: Every day | ORAL | Status: DC

## 2016-01-04 ENCOUNTER — Ambulatory Visit (HOSPITAL_BASED_OUTPATIENT_CLINIC_OR_DEPARTMENT_OTHER): Payer: Medicaid Other

## 2016-01-04 ENCOUNTER — Telehealth: Payer: Self-pay | Admitting: Hematology and Oncology

## 2016-01-04 ENCOUNTER — Other Ambulatory Visit (HOSPITAL_BASED_OUTPATIENT_CLINIC_OR_DEPARTMENT_OTHER): Payer: Medicaid Other

## 2016-01-04 ENCOUNTER — Ambulatory Visit (HOSPITAL_BASED_OUTPATIENT_CLINIC_OR_DEPARTMENT_OTHER): Payer: Medicaid Other | Admitting: Hematology and Oncology

## 2016-01-04 ENCOUNTER — Other Ambulatory Visit: Payer: Self-pay | Admitting: Hematology and Oncology

## 2016-01-04 ENCOUNTER — Encounter: Payer: Self-pay | Admitting: Hematology and Oncology

## 2016-01-04 VITALS — BP 127/91 | HR 79 | Temp 97.9°F | Resp 20 | Ht 66.0 in | Wt 166.1 lb

## 2016-01-04 DIAGNOSIS — R803 Bence Jones proteinuria: Secondary | ICD-10-CM

## 2016-01-04 DIAGNOSIS — C9000 Multiple myeloma not having achieved remission: Secondary | ICD-10-CM

## 2016-01-04 DIAGNOSIS — C9001 Multiple myeloma in remission: Secondary | ICD-10-CM

## 2016-01-04 DIAGNOSIS — Z5112 Encounter for antineoplastic immunotherapy: Secondary | ICD-10-CM | POA: Diagnosis not present

## 2016-01-04 DIAGNOSIS — G893 Neoplasm related pain (acute) (chronic): Secondary | ICD-10-CM

## 2016-01-04 LAB — COMPREHENSIVE METABOLIC PANEL
ALT: 18 U/L (ref 0–55)
AST: 17 U/L (ref 5–34)
Albumin: 4 g/dL (ref 3.5–5.0)
Alkaline Phosphatase: 80 U/L (ref 40–150)
Anion Gap: 8 mEq/L (ref 3–11)
BUN: 13.9 mg/dL (ref 7.0–26.0)
CALCIUM: 9.8 mg/dL (ref 8.4–10.4)
CHLORIDE: 108 meq/L (ref 98–109)
CO2: 26 meq/L (ref 22–29)
CREATININE: 1.1 mg/dL (ref 0.7–1.3)
EGFR: 83 mL/min/{1.73_m2} — ABNORMAL LOW (ref >90)
GLUCOSE: 90 mg/dL (ref 70–140)
POTASSIUM: 3.8 meq/L (ref 3.5–5.1)
SODIUM: 142 meq/L (ref 136–145)
Total Bilirubin: 0.58 mg/dL (ref 0.20–1.20)
Total Protein: 6.9 g/dL (ref 6.4–8.3)

## 2016-01-04 LAB — CBC WITH DIFFERENTIAL/PLATELET
BASO%: 1.5 % (ref 0.0–2.0)
BASOS ABS: 0.1 10*3/uL (ref 0.0–0.1)
EOS%: 7.3 % — AB (ref 0.0–7.0)
Eosinophils Absolute: 0.3 10*3/uL (ref 0.0–0.5)
HCT: 46.1 % (ref 38.4–49.9)
HGB: 15.4 g/dL (ref 13.0–17.1)
LYMPH#: 1.6 10*3/uL (ref 0.9–3.3)
LYMPH%: 42.3 % (ref 14.0–49.0)
MCH: 31 pg (ref 27.2–33.4)
MCHC: 33.5 g/dL (ref 32.0–36.0)
MCV: 92.4 fL (ref 79.3–98.0)
MONO#: 0.6 10*3/uL (ref 0.1–0.9)
MONO%: 14.9 % — AB (ref 0.0–14.0)
NEUT#: 1.3 10*3/uL — ABNORMAL LOW (ref 1.5–6.5)
NEUT%: 34 % — AB (ref 39.0–75.0)
Platelets: 267 10*3/uL (ref 140–400)
RBC: 4.99 10*6/uL (ref 4.20–5.82)
RDW: 13.9 % (ref 11.0–14.6)
WBC: 3.8 10*3/uL — ABNORMAL LOW (ref 4.0–10.3)

## 2016-01-04 MED ORDER — ACYCLOVIR 400 MG PO TABS: 400.0000 mg | ORAL_TABLET | Freq: Two times a day (BID) | ORAL | Status: DC

## 2016-01-04 MED ORDER — PROCHLORPERAZINE MALEATE 10 MG PO TABS
ORAL_TABLET | ORAL | Status: AC
  Filled 2016-01-04: qty 1

## 2016-01-04 MED ORDER — BORTEZOMIB CHEMO SQ INJECTION 3.5 MG (2.5MG/ML)
1.3000 mg/m2 | Freq: Once | INTRAMUSCULAR | Status: AC
  Administered 2016-01-04: 2.5 mg via SUBCUTANEOUS
  Filled 2016-01-04: qty 2.5

## 2016-01-04 MED ORDER — PROCHLORPERAZINE MALEATE 10 MG PO TABS
10.0000 mg | ORAL_TABLET | Freq: Once | ORAL | Status: AC
  Administered 2016-01-04: 10 mg via ORAL

## 2016-01-04 MED FILL — ACYCLOVIR 400 MG TABLET: 400 | 30 days supply | Qty: 60 | Fill #0

## 2016-01-04 NOTE — Assessment & Plan Note (Signed)
He is tolerating treatment well. Recent blood work show greater than 90% drop in serum light chain. Overall, his pain control is improving. He was seen by the transplant physician with plan for workup and stem cell transplant next month. I will proceed with cycle 6 of treatment without delay. He has not received Zometa, pending dental evaluation I have tapered him off dexamethasone last month He will continue aspirin for DVT prophylaxis and acyclovir for antimicrobial prophylaxis I reinforced the importance of calcium and vitamin D supplement

## 2016-01-04 NOTE — Patient Instructions (Signed)
[facility] Discharge Instructions for Patients Receiving Chemotherapy  Today you received the following chemotherapy agents Velcade. To help prevent nausea and vomiting after your treatment, we encourage you to take your nausea medication as directed.  If you develop nausea and vomiting that is not controlled by your nausea medication, call the clinic.   BELOW ARE SYMPTOMS THAT SHOULD BE REPORTED IMMEDIATELY:  *FEVER GREATER THAN 100.5 F  *CHILLS WITH OR WITHOUT FEVER  NAUSEA AND VOMITING THAT IS NOT CONTROLLED WITH YOUR NAUSEA MEDICATION  *UNUSUAL SHORTNESS OF BREATH  *UNUSUAL BRUISING OR BLEEDING  TENDERNESS IN MOUTH AND THROAT WITH OR WITHOUT PRESENCE OF ULCERS  *URINARY PROBLEMS  *BOWEL PROBLEMS  UNUSUAL RASH Items with * indicate a potential emergency and should be followed up as soon as possible.  Feel free to call the clinic you have any questions or concerns. The clinic phone number is [phone].  Please show the CHEMO ALERT CARD at check-in to the Emergency Department and triage nurse.    

## 2016-01-04 NOTE — Telephone Encounter (Signed)
Faxed pt medical records  To [facility]

## 2016-01-04 NOTE — Progress Notes (Signed)
[place] OFFICE PROGRESS NOTE  Patient Care Team: Boykin Nearing, MD as PCP - General (Family Medicine) Reola Calkins, MD as Consulting Physician (Hematology and Oncology)  SUMMARY OF ONCOLOGIC HISTORY:   Multiple myeloma in remission [facility])   08/28/2015 Bone Marrow Biopsy Accession: [id number] biopsy showed 58% myeloma involvement. Cytogenetics was normal and FISH showed Del 13q and t(11;14).    09/07/2015 -  Chemotherapy he was started on combined treatment of Velcade and Revlimid with Dex   09/12/2015 - 09/16/2015 Hospital Admission He was admitted to the hospital with sepsis, treatment was placed on hold and he received one dose of GCSF for leukopenia    INTERVAL HISTORY: Please see below for problem oriented charting. He feels well. Skin rash has resolved. The bone pain has improved, rated his poor pain at 3 out of 10 pain, well-controlled with tramadol. Dental clearance is pending. He has been to [facility] with plan for stem cell transplant next month. He will proceed with cycle 6 of treatment as scheduled today. He denies peripheral neuropathy.  REVIEW OF SYSTEMS:   Constitutional: Denies fevers, chills or abnormal weight loss Eyes: Denies blurriness of vision Ears, nose, mouth, throat, and face: Denies mucositis or sore throat Respiratory: Denies cough, dyspnea or wheezes Cardiovascular: Denies palpitation, chest discomfort or lower extremity swelling Gastrointestinal:  Denies nausea, heartburn or change in bowel habits Skin: Denies abnormal skin rashes Lymphatics: Denies new lymphadenopathy or easy bruising Neurological:Denies numbness, tingling or new weaknesses Behavioral/Psych: Mood is stable, no new changes  All other systems were reviewed with the patient and are negative.  I have reviewed the past medical history, past surgical history, social history and family history with the patient and they are unchanged from previous  note.  ALLERGIES:  is allergic to benadryl and latex.  MEDICATIONS:  Current Outpatient Prescriptions  Medication Sig Dispense Refill  . acyclovir (ZOVIRAX) 400 MG tablet Take 1 tablet (400 mg total) by mouth 2 (two) times daily. 60 tablet 3  . aspirin EC 81 MG tablet Take 81 mg by mouth daily.    . bisacodyl (DULCOLAX) 5 MG EC tablet Take 5 mg by mouth 2 (two) times daily as needed for moderate constipation.    Marland Kitchen CALCIUM PO Take 1 tablet by mouth daily.    . cholecalciferol (VITAMIN D) 1000 units tablet Take 2,000 Units by mouth daily.    . diclofenac sodium (VOLTAREN) 1 % GEL Apply 2 g topically 4 (four) times daily. (Patient taking differently: Apply 2 g topically 4 (four) times daily as needed (pain). ) 100 g 2  . lenalidomide (REVLIMID) 10 MG capsule Take 1 capsule (10 mg total) by mouth daily. For 14 days on and 7 days off 14 capsule 0  . lisinopril-hydrochlorothiazide (PRINZIDE,ZESTORETIC) 10-12.5 MG tablet Take 1 tablet by mouth daily. Reported on 10/06/2015 30 tablet 5  . ondansetron (ZOFRAN) 8 MG tablet Take 1 tablet (8 mg total) by mouth every 8 (eight) hours as needed (Nausea or vomiting). 30 tablet 1  . pantoprazole (PROTONIX) 40 MG tablet Take 1 tablet (40 mg total) by mouth daily. 30 tablet 6  . polyethylene glycol (MIRALAX / GLYCOLAX) packet Take 17 g by mouth 2 (two) times daily. 14 each 0  . prochlorperazine (COMPAZINE) 10 MG tablet Take 1 tablet (10 mg total) by mouth every 6 (six) hours as needed (Nausea or vomiting). 30 tablet 1  . traMADol (ULTRAM) 50 MG tablet TAKE 1 TO 2 TABLETS BY  MOUTH EVERY 6 HOURS AS NEEDED FOR PAIN 90 tablet 0  . senna-docusate (SENOKOT-S) 8.6-50 MG tablet Take 1 tablet by mouth 2 (two) times daily. (Patient not taking: Reported on 12/14/2015) 60 tablet 3   No current facility-administered medications for this visit.    PHYSICAL EXAMINATION: ECOG PERFORMANCE STATUS: 1 - Symptomatic but completely ambulatory  Filed Vitals:   01/04/16 1057  BP:  127/91  Pulse: 79  Temp: 97.9 F (36.6 C)  Resp: 20   Filed Weights   01/04/16 1057  Weight: 166 lb 1.6 oz (75.342 kg)    GENERAL:alert, no distress and comfortable SKIN: skin color, texture, turgor are normal, no rashes or significant lesions EYES: normal, Conjunctiva are pink and non-injected, sclera clear OROPHARYNX:no exudate, no erythema and lips, buccal mucosa, and tongue normal  NECK: supple, thyroid normal size, non-tender, without nodularity LYMPH:  no palpable lymphadenopathy in the cervical, axillary or inguinal LUNGS: clear to auscultation and percussion with normal breathing effort HEART: regular rate & rhythm and no murmurs and no lower extremity edema ABDOMEN:abdomen soft, non-tender and normal bowel sounds Musculoskeletal:no cyanosis of digits and no clubbing  NEURO: alert & oriented x 3 with fluent speech, no focal motor/sensory deficits  LABORATORY DATA:  I have reviewed the data as listed    Component Value Date/Time   NA 142 01/04/2016 1046   NA 139 12/14/2015 0631   K 3.8 01/04/2016 1046   K 4.4 12/14/2015 0631   CL 106 12/14/2015 0631   CO2 26 01/04/2016 1046   CO2 23 12/14/2015 0631   GLUCOSE 90 01/04/2016 1046   GLUCOSE 111* 12/14/2015 0631   BUN 13.9 01/04/2016 1046   BUN 13 12/14/2015 0631   CREATININE 1.1 01/04/2016 1046   CREATININE 1.12 12/14/2015 0631   CREATININE 1.13 07/24/2015 1155   CALCIUM 9.8 01/04/2016 1046   CALCIUM 9.0 12/14/2015 0631   PROT 6.9 01/04/2016 1046   PROT 6.2 11/16/2015 0956   PROT 6.1* 09/14/2015 0440   ALBUMIN 4.0 01/04/2016 1046   ALBUMIN 3.5 09/14/2015 0440   AST 17 01/04/2016 1046   AST 33 09/14/2015 0440   ALT 18 01/04/2016 1046   ALT 37 09/14/2015 0440   ALKPHOS 80 01/04/2016 1046   ALKPHOS 47 09/14/2015 0440   BILITOT 0.58 01/04/2016 1046   BILITOT 0.4 09/14/2015 0440   GFRNONAA >60 12/14/2015 0631   GFRNONAA 71 07/24/2015 1155   GFRAA >60 12/14/2015 0631   GFRAA 82 07/24/2015 1155    No results  found for: SPEP, UPEP  Lab Results  Component Value Date   WBC 3.8* 01/04/2016   NEUTROABS 1.3* 01/04/2016   HGB 15.4 01/04/2016   HCT 46.1 01/04/2016   MCV 92.4 01/04/2016   PLT 267 01/04/2016      Chemistry      Component Value Date/Time   NA 142 01/04/2016 1046   NA 139 12/14/2015 0631   K 3.8 01/04/2016 1046   K 4.4 12/14/2015 0631   CL 106 12/14/2015 0631   CO2 26 01/04/2016 1046   CO2 23 12/14/2015 0631   BUN 13.9 01/04/2016 1046   BUN 13 12/14/2015 0631   CREATININE 1.1 01/04/2016 1046   CREATININE 1.12 12/14/2015 0631   CREATININE 1.13 07/24/2015 1155      Component Value Date/Time   CALCIUM 9.8 01/04/2016 1046   CALCIUM 9.0 12/14/2015 0631   ALKPHOS 80 01/04/2016 1046   ALKPHOS 47 09/14/2015 0440   AST 17 01/04/2016 1046   AST 33 09/14/2015  0440   ALT 18 01/04/2016 1046   ALT 37 09/14/2015 0440   BILITOT 0.58 01/04/2016 1046   BILITOT 0.4 09/14/2015 0440     ASSESSMENT & PLAN:  Multiple myeloma in remission ([place]) He is tolerating treatment well. Recent blood work show greater than 90% drop in serum light chain. Overall, his pain control is improving. He was seen by the transplant physician with plan for workup and stem cell transplant next month. I will proceed with cycle 6 of treatment without delay. He has not received Zometa, pending dental evaluation I have tapered him off dexamethasone last month He will continue aspirin for DVT prophylaxis and acyclovir for antimicrobial prophylaxis I reinforced the importance of calcium and vitamin D supplement  Cancer associated pain The patient is adamant that he does not want to take narcotic therapy. He is currently taking tramadol as needed. We'll continue the same He is on calcium and vitamin D supplement   No orders of the defined types were placed in this encounter.   All questions were answered. The patient knows to call the clinic with any problems, questions or concerns. No barriers to learning  was detected. I spent 15 minutes counseling the patient face to face. The total time spent in the appointment was 20 minutes and more than 50% was on counseling and review of test results     [facility], [place], MD 01/04/2016 12:58 PM

## 2016-01-04 NOTE — Assessment & Plan Note (Signed)
The patient is adamant that he does not want to take narcotic therapy. He is currently taking tramadol as needed. We'll continue the same He is on calcium and vitamin D supplement

## 2016-01-05 ENCOUNTER — Telehealth: Payer: Self-pay | Admitting: *Deleted

## 2016-01-05 NOTE — Telephone Encounter (Signed)
Call from Urban Gibson,  BMT coordinator at [facility]  She states Dr. Tiana Loft recommends pt complete 6 cycles of chemotherapy prior to Transplant.   Informed Wells Guiles pt started Cycle 6, Day 1 yesterday.   Wells Guiles will arrange appts to move forward with transplant after chemo completed.

## 2016-01-07 ENCOUNTER — Ambulatory Visit (HOSPITAL_BASED_OUTPATIENT_CLINIC_OR_DEPARTMENT_OTHER): Payer: Medicaid Other

## 2016-01-07 ENCOUNTER — Telehealth: Payer: Self-pay | Admitting: *Deleted

## 2016-01-07 VITALS — BP 134/91 | HR 88 | Temp 98.3°F

## 2016-01-07 DIAGNOSIS — C9001 Multiple myeloma in remission: Secondary | ICD-10-CM

## 2016-01-07 DIAGNOSIS — Z5112 Encounter for antineoplastic immunotherapy: Secondary | ICD-10-CM | POA: Diagnosis not present

## 2016-01-07 DIAGNOSIS — R803 Bence Jones proteinuria: Secondary | ICD-10-CM

## 2016-01-07 MED ORDER — PROCHLORPERAZINE MALEATE 10 MG PO TABS
10.0000 mg | ORAL_TABLET | Freq: Once | ORAL | Status: AC
  Administered 2016-01-07: 10 mg via ORAL

## 2016-01-07 MED ORDER — BORTEZOMIB CHEMO SQ INJECTION 3.5 MG (2.5MG/ML)
1.3000 mg/m2 | Freq: Once | INTRAMUSCULAR | Status: AC
  Administered 2016-01-07: 2.5 mg via SUBCUTANEOUS
  Filled 2016-01-07: qty 2.5

## 2016-01-07 MED ORDER — PROCHLORPERAZINE MALEATE 10 MG PO TABS
ORAL_TABLET | ORAL | Status: AC
  Filled 2016-01-07: qty 1

## 2016-01-07 NOTE — Patient Instructions (Signed)
[facility] Discharge Instructions for Patients Receiving Chemotherapy  Today you received the following chemotherapy agents Velcade. To help prevent nausea and vomiting after your treatment, we encourage you to take your nausea medication as directed.  If you develop nausea and vomiting that is not controlled by your nausea medication, call the clinic.   BELOW ARE SYMPTOMS THAT SHOULD BE REPORTED IMMEDIATELY:  *FEVER GREATER THAN 100.5 F  *CHILLS WITH OR WITHOUT FEVER  NAUSEA AND VOMITING THAT IS NOT CONTROLLED WITH YOUR NAUSEA MEDICATION  *UNUSUAL SHORTNESS OF BREATH  *UNUSUAL BRUISING OR BLEEDING  TENDERNESS IN MOUTH AND THROAT WITH OR WITHOUT PRESENCE OF ULCERS  *URINARY PROBLEMS  *BOWEL PROBLEMS  UNUSUAL RASH Items with * indicate a potential emergency and should be followed up as soon as possible.  Feel free to call the clinic you have any questions or concerns. The clinic phone number is [phone].  Please show the CHEMO ALERT CARD at check-in to the Emergency Department and triage nurse.    

## 2016-01-07 NOTE — Progress Notes (Signed)
Ok to treat today w/ ANC 1.3 per Dr. Alvy Bimler.

## 2016-01-07 NOTE — Telephone Encounter (Signed)
Pt reports he has not heard from pharmacy about his Revlimid yet.  I called [facility] and s/w Aaron Edelman.  He says they have received the Rx and they have the start date for this cycle at 5/31.  Informed Aaron Edelman start date was supposed to be 5/22 and it is clearly marked on Rx.   Aaron Edelman agreed Rx does indicated 5/22 as start date.  He will mark Refill as urgent and hopefully someone will contact pt today to arrange delivery.   Informed pt of above.  Gave him the phone number for [facility] and instructed him to call pharmacy if he does not hear from them by tomorrow morning.  Start Revlimid when he gets it and let us know if any further problems/ delays.  Pt verbalized understanding.

## 2016-01-12 ENCOUNTER — Other Ambulatory Visit (HOSPITAL_BASED_OUTPATIENT_CLINIC_OR_DEPARTMENT_OTHER): Payer: Medicaid Other

## 2016-01-12 ENCOUNTER — Other Ambulatory Visit: Payer: Self-pay | Admitting: *Deleted

## 2016-01-12 ENCOUNTER — Ambulatory Visit: Payer: Medicaid Other

## 2016-01-12 ENCOUNTER — Other Ambulatory Visit: Payer: Self-pay | Admitting: Hematology and Oncology

## 2016-01-12 ENCOUNTER — Telehealth: Payer: Self-pay | Admitting: Hematology and Oncology

## 2016-01-12 DIAGNOSIS — R803 Bence Jones proteinuria: Secondary | ICD-10-CM

## 2016-01-12 DIAGNOSIS — C9000 Multiple myeloma not having achieved remission: Secondary | ICD-10-CM

## 2016-01-12 LAB — CBC WITH DIFFERENTIAL/PLATELET
BASO%: 0.3 % (ref 0.0–2.0)
BASOS ABS: 0 10*3/uL (ref 0.0–0.1)
EOS ABS: 0.3 10*3/uL (ref 0.0–0.5)
EOS%: 8.3 % — ABNORMAL HIGH (ref 0.0–7.0)
HCT: 44.4 % (ref 38.4–49.9)
HEMOGLOBIN: 14.7 g/dL (ref 13.0–17.1)
LYMPH%: 45.1 % (ref 14.0–49.0)
MCH: 30.8 pg (ref 27.2–33.4)
MCHC: 33.2 g/dL (ref 32.0–36.0)
MCV: 92.6 fL (ref 79.3–98.0)
MONO#: 0.6 10*3/uL (ref 0.1–0.9)
MONO%: 17.4 % — AB (ref 0.0–14.0)
NEUT#: 0.9 10*3/uL — ABNORMAL LOW (ref 1.5–6.5)
NEUT%: 28.9 % — AB (ref 39.0–75.0)
Platelets: 112 10*3/uL — ABNORMAL LOW (ref 140–400)
RBC: 4.79 10*6/uL (ref 4.20–5.82)
RDW: 13.4 % (ref 11.0–14.6)
WBC: 3.2 10*3/uL — ABNORMAL LOW (ref 4.0–10.3)
lymph#: 1.4 10*3/uL (ref 0.9–3.3)

## 2016-01-12 LAB — COMPREHENSIVE METABOLIC PANEL
ALBUMIN: 4 g/dL (ref 3.5–5.0)
ALK PHOS: 69 U/L (ref 40–150)
ALT: 12 U/L (ref 0–55)
AST: 14 U/L (ref 5–34)
Anion Gap: 9 mEq/L (ref 3–11)
BUN: 14 mg/dL (ref 7.0–26.0)
CO2: 27 mEq/L (ref 22–29)
Calcium: 9.3 mg/dL (ref 8.4–10.4)
Chloride: 105 mEq/L (ref 98–109)
Creatinine: 1.1 mg/dL (ref 0.7–1.3)
EGFR: 84 mL/min/{1.73_m2} — AB (ref >90)
GLUCOSE: 78 mg/dL (ref 70–140)
POTASSIUM: 3.6 meq/L (ref 3.5–5.1)
SODIUM: 141 meq/L (ref 136–145)
Total Bilirubin: 0.53 mg/dL (ref 0.20–1.20)
Total Protein: 6.7 g/dL (ref 6.4–8.3)

## 2016-01-12 MED ORDER — TRAMADOL HCL 50 MG PO TABS: 50.0000 mg | ORAL_TABLET | Freq: Four times a day (QID) | ORAL | Status: DC | PRN

## 2016-01-12 MED FILL — traMADol HCL 50 MG TABS: 50 | 11 days supply | Qty: 90 | Fill #0

## 2016-01-12 NOTE — Telephone Encounter (Signed)
left msg confirming 6/6 & 6/9 apts

## 2016-01-12 NOTE — Progress Notes (Signed)
ANC 0.9, no treatment today per Dr. Alvy Bimler.  Pt seen in infusion room, VSS, no new complaints.  Instructed patient to return in one week for lab/treatment.  Pt instructed to hold Revlimid for next week, and given neutropenic precautions.  Pt verbalized understanding.  Copy of labs given.  Instructed patient to see scheduling on way out.

## 2016-01-15 ENCOUNTER — Ambulatory Visit: Payer: Medicaid Other

## 2016-01-19 ENCOUNTER — Ambulatory Visit (HOSPITAL_BASED_OUTPATIENT_CLINIC_OR_DEPARTMENT_OTHER): Payer: Medicaid Other

## 2016-01-19 ENCOUNTER — Other Ambulatory Visit (HOSPITAL_BASED_OUTPATIENT_CLINIC_OR_DEPARTMENT_OTHER): Payer: Medicaid Other

## 2016-01-19 VITALS — BP 160/103 | HR 59 | Temp 98.4°F | Resp 18

## 2016-01-19 DIAGNOSIS — R803 Bence Jones proteinuria: Secondary | ICD-10-CM

## 2016-01-19 DIAGNOSIS — C9001 Multiple myeloma in remission: Secondary | ICD-10-CM

## 2016-01-19 DIAGNOSIS — Z5112 Encounter for antineoplastic immunotherapy: Secondary | ICD-10-CM

## 2016-01-19 DIAGNOSIS — C9000 Multiple myeloma not having achieved remission: Secondary | ICD-10-CM

## 2016-01-19 LAB — COMPREHENSIVE METABOLIC PANEL
ALT: 11 U/L (ref 0–55)
AST: 14 U/L (ref 5–34)
Albumin: 3.9 g/dL (ref 3.5–5.0)
Alkaline Phosphatase: 72 U/L (ref 40–150)
Anion Gap: 8 mEq/L (ref 3–11)
BUN: 17.1 mg/dL (ref 7.0–26.0)
CALCIUM: 9.4 mg/dL (ref 8.4–10.4)
CHLORIDE: 105 meq/L (ref 98–109)
CO2: 27 meq/L (ref 22–29)
CREATININE: 1.2 mg/dL (ref 0.7–1.3)
EGFR: 80 mL/min/{1.73_m2} — ABNORMAL LOW (ref >90)
Glucose: 81 mg/dl (ref 70–140)
Potassium: 3.6 mEq/L (ref 3.5–5.1)
Sodium: 140 mEq/L (ref 136–145)
Total Bilirubin: 0.47 mg/dL (ref 0.20–1.20)
Total Protein: 6.9 g/dL (ref 6.4–8.3)

## 2016-01-19 LAB — CBC WITH DIFFERENTIAL/PLATELET
BASO%: 0.4 % (ref 0.0–2.0)
Basophils Absolute: 0 10*3/uL (ref 0.0–0.1)
EOS%: 5.5 % (ref 0.0–7.0)
Eosinophils Absolute: 0.3 10*3/uL (ref 0.0–0.5)
HEMATOCRIT: 42.5 % (ref 38.4–49.9)
HGB: 14.7 g/dL (ref 13.0–17.1)
LYMPH#: 1.7 10*3/uL (ref 0.9–3.3)
LYMPH%: 34.3 % (ref 14.0–49.0)
MCH: 31.2 pg (ref 27.2–33.4)
MCHC: 34.6 g/dL (ref 32.0–36.0)
MCV: 90.2 fL (ref 79.3–98.0)
MONO#: 0.5 10*3/uL (ref 0.1–0.9)
MONO%: 10.5 % (ref 0.0–14.0)
NEUT#: 2.4 10*3/uL (ref 1.5–6.5)
NEUT%: 49.3 % (ref 39.0–75.0)
Platelets: 173 10*3/uL (ref 140–400)
RBC: 4.71 10*6/uL (ref 4.20–5.82)
RDW: 12.7 % (ref 11.0–14.6)
WBC: 5 10*3/uL (ref 4.0–10.3)

## 2016-01-19 MED ORDER — PROCHLORPERAZINE MALEATE 10 MG PO TABS
10.0000 mg | ORAL_TABLET | Freq: Once | ORAL | Status: AC
  Administered 2016-01-19: 10 mg via ORAL

## 2016-01-19 MED ORDER — PROCHLORPERAZINE MALEATE 10 MG PO TABS
ORAL_TABLET | ORAL | Status: AC
  Filled 2016-01-19: qty 1

## 2016-01-19 MED ORDER — BORTEZOMIB CHEMO SQ INJECTION 3.5 MG (2.5MG/ML)
1.3000 mg/m2 | Freq: Once | INTRAMUSCULAR | Status: AC
  Administered 2016-01-19: 2.5 mg via SUBCUTANEOUS
  Filled 2016-01-19: qty 2.5

## 2016-01-19 MED FILL — LISINOPRIL-HCTZ 10-12.5 MG: 10-12.5 | 30 days supply | Qty: 30 | Fill #2

## 2016-01-19 MED FILL — DICLOFENAC SODIUM 1% GEL: 1 | 12 days supply | Qty: 100 | Fill #1

## 2016-01-19 NOTE — Progress Notes (Signed)
Dr. Alvy Bimler notified of patient's elevated blood pressure. She gives the OK to treat. Patient was instructed to monitor his pressures at home and notify the office if he becomes symptomatic or if is prolonged. Patient verbalized understanding.

## 2016-01-19 NOTE — Patient Instructions (Signed)

## 2016-01-22 ENCOUNTER — Ambulatory Visit (HOSPITAL_BASED_OUTPATIENT_CLINIC_OR_DEPARTMENT_OTHER): Payer: No Typology Code available for payment source

## 2016-01-22 ENCOUNTER — Telehealth: Payer: Self-pay | Admitting: *Deleted

## 2016-01-22 VITALS — BP 127/96 | HR 90 | Temp 98.4°F | Resp 18

## 2016-01-22 DIAGNOSIS — C9001 Multiple myeloma in remission: Secondary | ICD-10-CM

## 2016-01-22 DIAGNOSIS — R803 Bence Jones proteinuria: Secondary | ICD-10-CM

## 2016-01-22 DIAGNOSIS — Z5112 Encounter for antineoplastic immunotherapy: Secondary | ICD-10-CM

## 2016-01-22 MED ORDER — PROCHLORPERAZINE MALEATE 10 MG PO TABS
10.0000 mg | ORAL_TABLET | Freq: Once | ORAL | Status: AC
  Administered 2016-01-22: 10 mg via ORAL

## 2016-01-22 MED ORDER — BORTEZOMIB CHEMO SQ INJECTION 3.5 MG (2.5MG/ML)
1.3000 mg/m2 | Freq: Once | INTRAMUSCULAR | Status: AC
  Administered 2016-01-22: 2.5 mg via SUBCUTANEOUS
  Filled 2016-01-22: qty 2.5

## 2016-01-22 MED ORDER — PROCHLORPERAZINE MALEATE 10 MG PO TABS
ORAL_TABLET | ORAL | Status: AC
  Filled 2016-01-22: qty 1

## 2016-01-22 NOTE — Patient Instructions (Signed)
[facility] Discharge Instructions for Patients Receiving Chemotherapy  Today you received the following chemotherapy agents Velcade. To help prevent nausea and vomiting after your treatment, we encourage you to take your nausea medication as directed.  If you develop nausea and vomiting that is not controlled by your nausea medication, call the clinic.   BELOW ARE SYMPTOMS THAT SHOULD BE REPORTED IMMEDIATELY:  *FEVER GREATER THAN 100.5 F  *CHILLS WITH OR WITHOUT FEVER  NAUSEA AND VOMITING THAT IS NOT CONTROLLED WITH YOUR NAUSEA MEDICATION  *UNUSUAL SHORTNESS OF BREATH  *UNUSUAL BRUISING OR BLEEDING  TENDERNESS IN MOUTH AND THROAT WITH OR WITHOUT PRESENCE OF ULCERS  *URINARY PROBLEMS  *BOWEL PROBLEMS  UNUSUAL RASH Items with * indicate a potential emergency and should be followed up as soon as possible.  Feel free to call the clinic you have any questions or concerns. The clinic phone number is [phone].  Please show the CHEMO ALERT CARD at check-in to the Emergency Department and triage nurse.    

## 2016-01-22 NOTE — Telephone Encounter (Signed)
Pt asked Infusion RN today if he is supposed to see Dr. Alvy Bimler again before his next appt at [facility] on 6/14?  He does not have any appointments scheduled.

## 2016-01-22 NOTE — Telephone Encounter (Signed)
Informed pt of Dr. Calton Dach reply.  He verbalized understanding.

## 2016-01-22 NOTE — Telephone Encounter (Signed)
No need to come back again until after transplant unless instructed by [facility]

## 2016-01-27 ENCOUNTER — Telehealth: Payer: Self-pay | Admitting: *Deleted

## 2016-01-27 NOTE — Telephone Encounter (Signed)
Notified [employer] of Revlimid on hold for now.  Pt going for stem cell transplant.  Will send new Rx when/ if Revlimid resumed.

## 2016-01-28 MED FILL — CYCLOBENZAPRINE 5 MG TABLET: 5 | 10 days supply | Qty: 30 | Fill #0

## 2016-02-03 ENCOUNTER — Telehealth: Payer: Self-pay | Admitting: *Deleted

## 2016-02-03 ENCOUNTER — Other Ambulatory Visit: Payer: Self-pay | Admitting: Hematology and Oncology

## 2016-02-03 NOTE — Telephone Encounter (Signed)
VM from Urban Gibson, BMT Coordinator at [facility],  States pt has "too much disease" to transplant at this time. Dr. Norma Fredrickson is recommending pt return to Dr. Alvy Bimler for more chemo before transplant.  He recommends Carfilzomib, Revlimid and Dex.Wells Guiles suggests Dr. Tiana Loft and Dr. Alvy Bimler discuss plans, but she wanted to let Dr. Alvy Bimler know what is happening.

## 2016-02-04 ENCOUNTER — Telehealth: Payer: Self-pay | Admitting: *Deleted

## 2016-02-04 ENCOUNTER — Other Ambulatory Visit: Payer: Self-pay | Admitting: Hematology and Oncology

## 2016-02-04 DIAGNOSIS — C9001 Multiple myeloma in remission: Secondary | ICD-10-CM

## 2016-02-04 NOTE — Telephone Encounter (Signed)
I placed orders for port, labs, see me next Tues and chemo Please make sure IR and other appt ok

## 2016-02-04 NOTE — Telephone Encounter (Signed)
LVM for Urban Gibson, BMT coordinator at [facility], to clarify chemotherapy orders from Dr. Tiana Loft.  Requested they fax orders.

## 2016-02-05 ENCOUNTER — Telehealth: Payer: Self-pay | Admitting: *Deleted

## 2016-02-05 ENCOUNTER — Telehealth: Payer: Self-pay | Admitting: Hematology and Oncology

## 2016-02-05 MED FILL — traMADol HCL 50 MG TABS: 50 | 11 days supply | Qty: 90 | Fill #0

## 2016-02-05 NOTE — Telephone Encounter (Signed)
Per staff message and POF I have scheduled appts. Advised scheduler of appts. JMW  

## 2016-02-05 NOTE — Telephone Encounter (Signed)
Informed pt Rx for Tramadol ready to pick up at our office.  Also informed him of new orders/ schedule per Dr. Alvy Bimler.  Plan on call from IR to get [facility] placement next week.  Plan on appt for Lab/[facility] on 6/27 and start chemo 6/29, 6/30.   Pt verbalized understanding.  Will wait for calls from scheduler and IR.  Instructed pt to call us if any questions.

## 2016-02-05 NOTE — Telephone Encounter (Signed)
spoke w/ pt confirmed 6/27 apts

## 2016-02-08 ENCOUNTER — Other Ambulatory Visit: Payer: Self-pay | Admitting: Radiology

## 2016-02-09 ENCOUNTER — Telehealth: Payer: Self-pay | Admitting: Hematology and Oncology

## 2016-02-09 ENCOUNTER — Other Ambulatory Visit: Payer: Self-pay | Admitting: Hematology and Oncology

## 2016-02-09 ENCOUNTER — Ambulatory Visit (HOSPITAL_COMMUNITY)
Admission: RE | Admit: 2016-02-09 | Discharge: 2016-02-09 | Disposition: A | Discharge status: Home / Self Care | Payer: Medicaid Other | Source: Ambulatory Visit | Attending: Hematology and Oncology | Admitting: Hematology and Oncology

## 2016-02-09 ENCOUNTER — Encounter: Payer: Self-pay | Admitting: Hematology and Oncology

## 2016-02-09 ENCOUNTER — Ambulatory Visit (HOSPITAL_BASED_OUTPATIENT_CLINIC_OR_DEPARTMENT_OTHER): Payer: Medicaid Other | Admitting: Hematology and Oncology

## 2016-02-09 ENCOUNTER — Other Ambulatory Visit (HOSPITAL_BASED_OUTPATIENT_CLINIC_OR_DEPARTMENT_OTHER): Payer: Medicaid Other

## 2016-02-09 VITALS — BP 127/88 | HR 91 | Temp 98.1°F | Resp 18 | Ht 66.0 in | Wt 158.6 lb

## 2016-02-09 DIAGNOSIS — C9001 Multiple myeloma in remission: Secondary | ICD-10-CM | POA: Diagnosis present

## 2016-02-09 DIAGNOSIS — Z9221 Personal history of antineoplastic chemotherapy: Secondary | ICD-10-CM | POA: Diagnosis not present

## 2016-02-09 DIAGNOSIS — Z5111 Encounter for antineoplastic chemotherapy: Secondary | ICD-10-CM

## 2016-02-09 DIAGNOSIS — Z7982 Long term (current) use of aspirin: Secondary | ICD-10-CM | POA: Diagnosis not present

## 2016-02-09 DIAGNOSIS — C9000 Multiple myeloma not having achieved remission: Secondary | ICD-10-CM | POA: Insufficient documentation

## 2016-02-09 DIAGNOSIS — G620 Drug-induced polyneuropathy: Secondary | ICD-10-CM | POA: Diagnosis not present

## 2016-02-09 DIAGNOSIS — T451X5A Adverse effect of antineoplastic and immunosuppressive drugs, initial encounter: Principal | ICD-10-CM

## 2016-02-09 DIAGNOSIS — G893 Neoplasm related pain (acute) (chronic): Secondary | ICD-10-CM

## 2016-02-09 DIAGNOSIS — Z87891 Personal history of nicotine dependence: Secondary | ICD-10-CM | POA: Diagnosis not present

## 2016-02-09 DIAGNOSIS — I10 Essential (primary) hypertension: Secondary | ICD-10-CM | POA: Diagnosis not present

## 2016-02-09 HISTORY — DX: Encounter for antineoplastic chemotherapy: Z51.11

## 2016-02-09 LAB — COMPREHENSIVE METABOLIC PANEL
ALT: 18 U/L (ref 0–55)
ANION GAP: 11 meq/L (ref 3–11)
AST: 14 U/L (ref 5–34)
Albumin: 4 g/dL (ref 3.5–5.0)
Alkaline Phosphatase: 79 U/L (ref 40–150)
BUN: 11.2 mg/dL (ref 7.0–26.0)
CALCIUM: 9.7 mg/dL (ref 8.4–10.4)
CHLORIDE: 105 meq/L (ref 98–109)
CO2: 24 mEq/L (ref 22–29)
CREATININE: 1.1 mg/dL (ref 0.7–1.3)
EGFR: 81 mL/min/{1.73_m2} — ABNORMAL LOW (ref >90)
Glucose: 93 mg/dl (ref 70–140)
POTASSIUM: 3.9 meq/L (ref 3.5–5.1)
Sodium: 139 mEq/L (ref 136–145)
Total Bilirubin: 0.68 mg/dL (ref 0.20–1.20)
Total Protein: 6.9 g/dL (ref 6.4–8.3)

## 2016-02-09 LAB — CBC WITH DIFFERENTIAL/PLATELET
BASO%: 0.5 % (ref 0.0–2.0)
BASOS ABS: 0 10*3/uL (ref 0.0–0.1)
EOS%: 5.5 % (ref 0.0–7.0)
Eosinophils Absolute: 0.2 10*3/uL (ref 0.0–0.5)
HEMATOCRIT: 42.3 % (ref 38.4–49.9)
HGB: 14.9 g/dL (ref 13.0–17.1)
LYMPH#: 1.5 10*3/uL (ref 0.9–3.3)
LYMPH%: 41 % (ref 14.0–49.0)
MCH: 31.6 pg (ref 27.2–33.4)
MCHC: 35.2 g/dL (ref 32.0–36.0)
MCV: 89.8 fL (ref 79.3–98.0)
MONO#: 0.4 10*3/uL (ref 0.1–0.9)
MONO%: 9.6 % (ref 0.0–14.0)
NEUT#: 1.6 10*3/uL (ref 1.5–6.5)
NEUT%: 43.4 % (ref 39.0–75.0)
PLATELETS: 257 10*3/uL (ref 140–400)
RBC: 4.71 10*6/uL (ref 4.20–5.82)
RDW: 13.3 % (ref 11.0–14.6)
WBC: 3.7 10*3/uL — ABNORMAL LOW (ref 4.0–10.3)

## 2016-02-09 LAB — PROTIME-INR
INR: 1.05 (ref 0.00–1.49)
Prothrombin Time: 13.5 seconds (ref 11.6–15.2)

## 2016-02-09 MED ORDER — FENTANYL CITRATE (PF) 100 MCG/2ML IJ SOLN
INTRAMUSCULAR | Status: AC | PRN
  Administered 2016-02-09: 50 ug via INTRAVENOUS
  Administered 2016-02-09: 25 ug via INTRAVENOUS
  Administered 2016-02-09: 50 ug via INTRAVENOUS

## 2016-02-09 MED ORDER — PROCHLORPERAZINE MALEATE 10 MG PO TABS: 10.0000 mg | ORAL_TABLET | Freq: Four times a day (QID) | ORAL | Status: DC | PRN

## 2016-02-09 MED ORDER — SODIUM CHLORIDE 0.9 % IV SOLN
INTRAVENOUS | Status: DC
  Administered 2016-02-09: 11:00:00 via INTRAVENOUS

## 2016-02-09 MED ORDER — LIDOCAINE HCL 1 % IJ SOLN
INTRAMUSCULAR | Status: AC
  Filled 2016-02-09: qty 20

## 2016-02-09 MED ORDER — MIDAZOLAM HCL 2 MG/2ML IJ SOLN
INTRAMUSCULAR | Status: AC
  Filled 2016-02-09: qty 4

## 2016-02-09 MED ORDER — ONDANSETRON HCL 8 MG PO TABS
8.0000 mg | ORAL_TABLET | Freq: Three times a day (TID) | ORAL | Status: DC | PRN
Start: 2016-02-09 — End: 2016-05-18

## 2016-02-09 MED ORDER — MIDAZOLAM HCL 2 MG/2ML IJ SOLN
INTRAMUSCULAR | Status: AC | PRN
  Administered 2016-02-09 (×3): 1 mg via INTRAVENOUS

## 2016-02-09 MED ORDER — CEFAZOLIN SODIUM-DEXTROSE 2-4 GM/100ML-% IV SOLN
2.0000 g | INTRAVENOUS | Status: AC
  Administered 2016-02-09: 2 g via INTRAVENOUS
  Filled 2016-02-09: qty 100

## 2016-02-09 MED ORDER — GABAPENTIN 300 MG PO CAPS: 300.0000 mg | ORAL_CAPSULE | Freq: Three times a day (TID) | ORAL | Status: DC

## 2016-02-09 MED ORDER — LIDOCAINE-PRILOCAINE 2.5-2.5 % EX CREA: TOPICAL_CREAM | CUTANEOUS | Status: DC

## 2016-02-09 MED ORDER — HEPARIN SOD (PORK) LOCK FLUSH 100 UNIT/ML IV SOLN
INTRAVENOUS | Status: AC
  Filled 2016-02-09: qty 5

## 2016-02-09 MED ORDER — DEXAMETHASONE 4 MG PO TABS: ORAL_TABLET | ORAL | Status: DC

## 2016-02-09 MED ORDER — FENTANYL CITRATE (PF) 100 MCG/2ML IJ SOLN
INTRAMUSCULAR | Status: AC
  Filled 2016-02-09: qty 4

## 2016-02-09 MED FILL — ONDANSETRON HCL 8 MG TABLET: 8 | 20 days supply | Qty: 60 | Fill #0

## 2016-02-09 MED FILL — GABAPENTIN 300 MG CAPSULE: 300 | 30 days supply | Qty: 90 | Fill #0

## 2016-02-09 MED FILL — LIDOCAINE-PRILOCAINE CREAM: 2.5-2.5 | 10 days supply | Qty: 30 | Fill #0

## 2016-02-09 MED FILL — PROCHLORPERAZINE 10 MG TAB: 10 | 7 days supply | Qty: 30 | Fill #0

## 2016-02-09 MED FILL — DEXAMETHASONE 4 MG TABLET: 4 | 56 days supply | Qty: 40 | Fill #0

## 2016-02-09 MED FILL — ACYCLOVIR 400 MG TABLET: 400 | 30 days supply | Qty: 60 | Fill #1

## 2016-02-09 NOTE — Patient Instructions (Signed)
Carfilzomib injection What is this medicine? CARFILZOMIB (kar FILZ oh mib) targets a specific protein within cancer cells and stops the cancer cells from growing. It is used to treat multiple myeloma. This medicine may be used for other purposes; ask your health care provider or pharmacist if you have questions. What should I tell my health care provider before I take this medicine? They need to know if you have any of these conditions: -heart disease -history of blood clots -irregular heartbeat -kidney disease -liver disease -lung or breathing disease -an unusual or allergic reaction to carfilzomib, or other medicines, foods, dyes, or preservatives -pregnant or trying to get pregnant -breast-feeding How should I use this medicine? This medicine is for injection or infusion into a vein. It is given by a health care professional in a hospital or clinic setting. Talk to your pediatrician regarding the use of this medicine in children. Special care may be needed. Overdosage: If you think you have taken too much of this medicine contact a poison control center or emergency room at once. NOTE: This medicine is only for you. Do not share this medicine with others. What if I miss a dose? It is important not to miss your dose. Call your doctor or health care professional if you are unable to keep an appointment. What may interact with this medicine? Interactions are not expected. Give your health care provider a list of all the medicines, herbs, non-prescription drugs, or dietary supplements you use. Also tell them if you smoke, drink alcohol, or use illegal drugs. Some items may interact with your medicine. This list may not describe all possible interactions. Give your health care provider a list of all the medicines, herbs, non-prescription drugs, or dietary supplements you use. Also tell them if you smoke, drink alcohol, or use illegal drugs. Some items may interact with your medicine. What  should I watch for while using this medicine? Your condition will be monitored carefully while you are receiving this medicine. Report any side effects. Continue your course of treatment even though you feel ill unless your doctor tells you to stop. You may need blood work done while you are taking this medicine. Do not become pregnant while taking this medicine or for at least 30 days after stopping it. Women should inform their doctor if they wish to become pregnant or think they might be pregnant. There is a potential for serious side effects to an unborn child. Men should not father a child while taking this medicine and for 90 days after stopping it. Talk to your health care professional or pharmacist for more information. Do not breast-feed an infant while taking this medicine. Check with your doctor or health care professional if you get an attack of severe diarrhea, nausea and vomiting, or if you sweat a lot. The loss of too much body fluid can make it dangerous for you to take this medicine. You may get dizzy. Do not drive, use machinery, or do anything that needs mental alertness until you know how this medicine affects you. Do not stand or sit up quickly, especially if you are an older patient. This reduces the risk of dizzy or fainting spells. What side effects may I notice from receiving this medicine? Side effects that you should report to your doctor or health care professional as soon as possible: -allergic reactions like skin rash, itching or hives, swelling of the face, lips, or tongue -confusion -dizziness -feeling faint or lightheaded -fever or chills -palpitations -seizures -signs and   symptoms of bleeding such as bloody or black, tarry stools; red or dark-brown urine; spitting up blood or brown material that looks like coffee grounds; red spots on the skin; unusual bruising or bleeding including from the eye, gums, or nose -signs and symptoms of a blood clot such as breathing  problems; changes in vision; chest pain; severe, sudden headache; pain, swelling, warmth in the leg; trouble speaking; sudden numbness or weakness of the face, arm or leg -signs and symptoms of kidney injury like trouble passing urine or change in the amount of urine -signs and symptoms of liver injury like dark yellow or brown urine; general ill feeling or flu-like symptoms; light-colored stools; loss of appetite; nausea; right upper belly pain; unusually weak or tired; yellowing of the eyes or skin Side effects that usually do not require medical attention (report to your doctor or health care professional if they continue or are bothersome): -back pain -cough -diarrhea -headache -muscle cramps -vomiting This list may not describe all possible side effects. Call your doctor for medical advice about side effects. You may report side effects to FDA at 1-800-FDA-1088. Where should I keep my medicine? This drug is given in a hospital or clinic and will not be stored at home. NOTE: This sheet is a summary. It may not cover all possible information. If you have questions about this medicine, talk to your doctor, pharmacist, or health care provider.    2016, Elsevier/Gold Standard. (2015-03-24 16:16:00) Cyclophosphamide injection What is this medicine? CYCLOPHOSPHAMIDE (sye kloe FOSS fa mide) is a chemotherapy drug. It slows the growth of cancer cells. This medicine is used to treat many types of cancer like lymphoma, myeloma, leukemia, breast cancer, and ovarian cancer, to name a few. This medicine may be used for other purposes; ask your health care provider or pharmacist if you have questions. What should I tell my health care provider before I take this medicine? They need to know if you have any of these conditions: -blood disorders -history of other chemotherapy -infection -kidney disease -liver disease -recent or ongoing radiation therapy -tumors in the bone marrow -an unusual or  allergic reaction to cyclophosphamide, other chemotherapy, other medicines, foods, dyes, or preservatives -pregnant or trying to get pregnant -breast-feeding How should I use this medicine? This drug is usually given as an injection into a vein or muscle or by infusion into a vein. It is administered in a hospital or clinic by a specially trained health care professional. Talk to your pediatrician regarding the use of this medicine in children. Special care may be needed. Overdosage: If you think you have taken too much of this medicine contact a poison control center or emergency room at once. NOTE: This medicine is only for you. Do not share this medicine with others. What if I miss a dose? It is important not to miss your dose. Call your doctor or health care professional if you are unable to keep an appointment. What may interact with this medicine? This medicine may interact with the following medications: -amiodarone -amphotericin B -azathioprine -certain antiviral medicines for HIV or AIDS such as protease inhibitors (e.g., indinavir, ritonavir) and zidovudine -certain blood pressure medications such as benazepril, captopril, enalapril, fosinopril, lisinopril, moexipril, monopril, perindopril, quinapril, ramipril, trandolapril -certain cancer medications such as anthracyclines (e.g., daunorubicin, doxorubicin), busulfan, cytarabine, paclitaxel, pentostatin, tamoxifen, trastuzumab -certain diuretics such as chlorothiazide, chlorthalidone, hydrochlorothiazide, indapamide, metolazone -certain medicines that treat or prevent blood clots like warfarin -certain muscle relaxants such as succinylcholine -cyclosporine -etanercept -indomethacin -  medicines to increase blood counts like filgrastim, pegfilgrastim, sargramostim -medicines used as general anesthesia -metronidazole -natalizumab This list may not describe all possible interactions. Give your health care provider a list of all the  medicines, herbs, non-prescription drugs, or dietary supplements you use. Also tell them if you smoke, drink alcohol, or use illegal drugs. Some items may interact with your medicine. What should I watch for while using this medicine? Visit your doctor for checks on your progress. This drug may make you feel generally unwell. This is not uncommon, as chemotherapy can affect healthy cells as well as cancer cells. Report any side effects. Continue your course of treatment even though you feel ill unless your doctor tells you to stop. Drink water or other fluids as directed. Urinate often, even at night. In some cases, you may be given additional medicines to help with side effects. Follow all directions for their use. Call your doctor or health care professional for advice if you get a fever, chills or sore throat, or other symptoms of a cold or flu. Do not treat yourself. This drug decreases your body's ability to fight infections. Try to avoid being around people who are sick. This medicine may increase your risk to bruise or bleed. Call your doctor or health care professional if you notice any unusual bleeding. Be careful brushing and flossing your teeth or using a toothpick because you may get an infection or bleed more easily. If you have any dental work done, tell your dentist you are receiving this medicine. You may get drowsy or dizzy. Do not drive, use machinery, or do anything that needs mental alertness until you know how this medicine affects you. Do not become pregnant while taking this medicine or for 1 year after stopping it. Women should inform their doctor if they wish to become pregnant or think they might be pregnant. Men should not father a child while taking this medicine and for 4 months after stopping it. There is a potential for serious side effects to an unborn child. Talk to your health care professional or pharmacist for more information. Do not breast-feed an infant while taking  this medicine. This medicine may interfere with the ability to have a child. This medicine has caused ovarian failure in some women. This medicine has caused reduced sperm counts in some men. You should talk with your doctor or health care professional if you are concerned about your fertility. If you are going to have surgery, tell your doctor or health care professional that you have taken this medicine. What side effects may I notice from receiving this medicine? Side effects that you should report to your doctor or health care professional as soon as possible: -allergic reactions like skin rash, itching or hives, swelling of the face, lips, or tongue -low blood counts - this medicine may decrease the number of white blood cells, red blood cells and platelets. You may be at increased risk for infections and bleeding. -signs of infection - fever or chills, cough, sore throat, pain or difficulty passing urine -signs of decreased platelets or bleeding - bruising, pinpoint red spots on the skin, black, tarry stools, blood in the urine -signs of decreased red blood cells - unusually weak or tired, fainting spells, lightheadedness -breathing problems -dark urine -dizziness -palpitations -swelling of the ankles, feet, hands -trouble passing urine or change in the amount of urine -weight gain -yellowing of the eyes or skin Side effects that usually do not require medical attention (report to   your doctor or health care professional if they continue or are bothersome): -changes in nail or skin color -hair loss -missed menstrual periods -mouth sores -nausea, vomiting This list may not describe all possible side effects. Call your doctor for medical advice about side effects. You may report side effects to FDA at 1-800-FDA-1088. Where should I keep my medicine? This drug is given in a hospital or clinic and will not be stored at home. NOTE: This sheet is a summary. It may not cover all possible  information. If you have questions about this medicine, talk to your doctor, pharmacist, or health care provider.    2016, Elsevier/[employer] Standard. (2012-06-15 16:22:58)

## 2016-02-09 NOTE — Assessment & Plan Note (Signed)
The patient is adamant that he does not want to take narcotic therapy. He is currently taking tramadol as needed. We'll continue the same He is on calcium and vitamin D supplement

## 2016-02-09 NOTE — Telephone Encounter (Signed)
Added md apt per pof, gave pt cal & avs

## 2016-02-09 NOTE — Progress Notes (Signed)
[place] OFFICE PROGRESS NOTE  Patient Care Team: Boykin Nearing, MD as PCP - General (Family Medicine) Reola Calkins, MD as Consulting Physician (Hematology and Oncology)  SUMMARY OF ONCOLOGIC HISTORY:   Multiple myeloma in remission [facility])   08/28/2015 Bone Marrow Biopsy Accession: [id number] biopsy showed 58% myeloma involvement. Cytogenetics was normal and FISH showed Del 13q and t(11;14).    09/07/2015 - 01/22/2016 Chemotherapy he received 6 cycles of treatment of Velcade and Revlimid with Dex    09/12/2015 - 09/16/2015 Hospital Admission He was admitted to the hospital with sepsis, treatment was placed on hold and he received one dose of GCSF for leukopenia   01/12/2016 Adverse Reaction Delay treatment due to neutropenia   01/27/2016 Imaging ECHO at [facility] showed normal EF   01/27/2016 PET scan [facility] scan at [facility] showed innumerable lytic lesions throughout   01/27/2016 Procedure PFT is within normal limits at [facility]   01/28/2016 Bone Marrow Biopsy Bone marrow at [facility] showed persistent plasma cell myeloma in a normocellularmarrow (30%) with 20% atypical plasma cells and myeloid hyperplasia.    INTERVAL HISTORY: Please see below for problem oriented charting. He returns here for further follow-up. Per discussion with his transplant physician at [facility], he needs to be switched to a different line of chemotherapy to get deeper response. Since the last time I saw him, he continues to have chronic bone pain, stable on current prescription tramadol. He denies recent infection. No new recent skin rashes. He complained of peripheral neuropathy with tingling and sharp pain affecting lower extremities I reviewed his outside records and summarized as above  REVIEW OF SYSTEMS:   Constitutional: Denies fevers, chills or abnormal weight loss Eyes: Denies blurriness of vision Ears, nose, mouth, throat, and face: Denies mucositis or sore  throat Respiratory: Denies cough, dyspnea or wheezes Cardiovascular: Denies palpitation, chest discomfort or lower extremity swelling Gastrointestinal:  Denies nausea, heartburn or change in bowel habits Skin: Denies abnormal skin rashes Lymphatics: Denies new lymphadenopathy or easy bruising Behavioral/Psych: Mood is stable, no new changes  All other systems were reviewed with the patient and are negative.  I have reviewed the past medical history, past surgical history, social history and family history with the patient and they are unchanged from previous note.  ALLERGIES:  is allergic to benadryl and latex.  MEDICATIONS:  Current Outpatient Prescriptions  Medication Sig Dispense Refill  . acyclovir (ZOVIRAX) 400 MG tablet Take 1 tablet (400 mg total) by mouth 2 (two) times daily. 60 tablet 3  . aspirin EC 81 MG tablet Take 81 mg by mouth daily.    . bisacodyl (DULCOLAX) 5 MG EC tablet Take 5 mg by mouth 2 (two) times daily as needed for moderate constipation.    Marland Kitchen CALCIUM PO Take 1 tablet by mouth daily.    . cholecalciferol (VITAMIN D) 1000 units tablet Take 2,000 Units by mouth daily.    . diclofenac sodium (VOLTAREN) 1 % GEL Apply 2 g topically 4 (four) times daily. (Patient taking differently: Apply 2 g topically 4 (four) times daily as needed (pain). ) 100 g 2  . lisinopril-hydrochlorothiazide (PRINZIDE,ZESTORETIC) 10-12.5 MG tablet Take 1 tablet by mouth daily. Reported on 10/06/2015 30 tablet 5  . pantoprazole (PROTONIX) 40 MG tablet Take 1 tablet (40 mg total) by mouth daily. 30 tablet 6  . polyethylene glycol (MIRALAX / GLYCOLAX) packet Take 17 g by mouth 2 (two) times daily. 14 each 0  . senna-docusate (SENOKOT-S)  8.6-50 MG tablet Take 1 tablet by mouth 2 (two) times daily. 60 tablet 3  . traMADol (ULTRAM) 50 MG tablet TAKE 1 TO 2 TABLETS BY MOUTH EVERY 6 HOURS AS NEEDED FOR PAIN 90 tablet 0  . dexamethasone (DECADRON) 4 MG tablet Take 5 pills every week on Thursdays with food  40 tablet 4  . gabapentin (NEURONTIN) 300 MG capsule Take 1 capsule (300 mg total) by mouth 3 (three) times daily. 90 capsule 6  . lidocaine-prilocaine (EMLA) cream Apply to affected area once 30 g 3  . ondansetron (ZOFRAN) 8 MG tablet Take 1 tablet (8 mg total) by mouth every 8 (eight) hours as needed for nausea. 60 tablet 3  . prochlorperazine (COMPAZINE) 10 MG tablet Take 1 tablet (10 mg total) by mouth every 6 (six) hours as needed (Nausea or vomiting). 30 tablet 9   No current facility-administered medications for this visit.    PHYSICAL EXAMINATION: ECOG PERFORMANCE STATUS: 1 - Symptomatic but completely ambulatory  Filed Vitals:   02/09/16 0918  BP: 127/88  Pulse: 91  Temp: 98.1 F (36.7 C)  Resp: 18   Filed Weights   02/09/16 0918  Weight: 158 lb 9.6 oz (71.94 kg)    GENERAL:alert, no distress and comfortable SKIN: skin color, texture, turgor are normal, no rashes or significant lesions EYES: normal, Conjunctiva are pink and non-injected, sclera clear Musculoskeletal:no cyanosis of digits and no clubbing  NEURO: alert & oriented x 3 with fluent speech, no focal motor/sensory deficits  LABORATORY DATA:  I have reviewed the data as listed    Component Value Date/Time   NA 139 02/09/2016 0854   NA 139 12/14/2015 0631   K 3.9 02/09/2016 0854   K 4.4 12/14/2015 0631   CL 106 12/14/2015 0631   CO2 24 02/09/2016 0854   CO2 23 12/14/2015 0631   GLUCOSE 93 02/09/2016 0854   GLUCOSE 111* 12/14/2015 0631   BUN 11.2 02/09/2016 0854   BUN 13 12/14/2015 0631   CREATININE 1.1 02/09/2016 0854   CREATININE 1.12 12/14/2015 0631   CREATININE 1.13 07/24/2015 1155   CALCIUM 9.7 02/09/2016 0854   CALCIUM 9.0 12/14/2015 0631   PROT 6.9 02/09/2016 0854   PROT 6.2 11/16/2015 0956   PROT 6.1* 09/14/2015 0440   ALBUMIN 4.0 02/09/2016 0854   ALBUMIN 3.5 09/14/2015 0440   AST 14 02/09/2016 0854   AST 33 09/14/2015 0440   ALT 18 02/09/2016 0854   ALT 37 09/14/2015 0440   ALKPHOS  79 02/09/2016 0854   ALKPHOS 47 09/14/2015 0440   BILITOT 0.68 02/09/2016 0854   BILITOT 0.4 09/14/2015 0440   GFRNONAA >60 12/14/2015 0631   GFRNONAA 71 07/24/2015 1155   GFRAA >60 12/14/2015 0631   GFRAA 82 07/24/2015 1155    No results found for: SPEP, UPEP  Lab Results  Component Value Date   WBC 3.7* 02/09/2016   NEUTROABS 1.6 02/09/2016   HGB 14.9 02/09/2016   HCT 42.3 02/09/2016   MCV 89.8 02/09/2016   PLT 257 02/09/2016      Chemistry      Component Value Date/Time   NA 139 02/09/2016 0854   NA 139 12/14/2015 0631   K 3.9 02/09/2016 0854   K 4.4 12/14/2015 0631   CL 106 12/14/2015 0631   CO2 24 02/09/2016 0854   CO2 23 12/14/2015 0631   BUN 11.2 02/09/2016 0854   BUN 13 12/14/2015 0631   CREATININE 1.1 02/09/2016 0854   CREATININE 1.12 12/14/2015 0631  CREATININE 1.13 07/24/2015 1155      Component Value Date/Time   CALCIUM 9.7 02/09/2016 0854   CALCIUM 9.0 12/14/2015 0631   ALKPHOS 79 02/09/2016 0854   ALKPHOS 47 09/14/2015 0440   AST 14 02/09/2016 0854   AST 33 09/14/2015 0440   ALT 18 02/09/2016 0854   ALT 37 09/14/2015 0440   BILITOT 0.68 02/09/2016 0854   BILITOT 0.4 09/14/2015 0440       ASSESSMENT & PLAN:  Multiple myeloma in remission ([place]) I reviewed all the test results performed at [facility]. Per discussion with his transplant physician, we will proceed with second line treatment as a bridge to get a deeper response prior to autologous stem cell transplant The treatment decision is based on the publication below:  Carfilzomib, cyclophosphamide, and dexamethasone in patients with newly diagnosed multiple myeloma: a multicenter, phase 2 study Claudie Leach, Erick Blinks Petrucci, Geronimo Running, Dudley Conticello, [place], [place] Magarotto, [employer], Luana Boccadifuoco, [employer], [place], [place], Giovannino [place], [place], [place],  [place], Pieter [place]  Blood 2014 [phone]; doi: BankRights.uy   This multicenter, open-label phase 2 trial determined the safety and efficacy of carfilzomib, a novel and irreversible proteasome inhibitor, in combination with cyclophosphamide and dexamethasone (CCyd) in patients with newly diagnosed multiple myeloma (NDMM) ?60 years of age or who were ineligible for autologous stem cell transplantation. Patients (N = 58) received CCyd for up to 9 28-day cycles, followed by maintenance with carfilzomib until progression or intolerance.   After a median of 9 CCyd induction cycles (range 1-9), 95% of patients achieved at least a partial response, 71% achieved at least a very good partial response, 49% achieved at least a near complete response, and 20% achieved stringent complete response.  After a median follow-up of 18 months, the 2-year progression-free survival and overall survival rates were 76% and 87%, respectively. The most frequent grade 3 to 5 toxicities were neutropenia (20%), anemia (11%), and cardiopulmonary adverse events (7%). Peripheral neuropathy was limited to grades 1 and 2 (9%). Fourteen percent of patients discontinued treatment because of adverse events, and 21% of patients required carfilzomib dose reductions. In summary, results showed high complete response rates and a good safety profile  We discussed some of the risks, benefits, side-effects of Cytoxan, Kyprolis and Dexamethasone.   Some of the short term side-effects included, though not limited to, risk of fatigue, risk of allergic reactions, mouth sores, weight loss, pancytopenia, life-threatening infections, need for transfusions of blood products, nausea, vomiting, change in bowel habits, blood clots, admission to hospital for various reasons, and risks of death.   Long term side-effects are also discussed  including risks of infertility, permanent damage to nerve function, chronic fatigue, and rare secondary malignancy including bone marrow disorders and leukemia.   The patient is aware that the response rates discussed earlier is not guaranteed.  After a long discussion, patient made an informed decision to proceed with the prescribed plan of care.   Patient education materials were dispensed today Instead of oral Cytoxan, he will be receiving IV Cytoxan here on days 1, 8 and 15 The patient will also continue weekly dexamethasone and 20 mg by mouth. He will continue acyclovir for antimicrobial prophylaxis I will see him prior to week 3 of therapy.  Peripheral neuropathy due to chemotherapy [facility]) The patient has neuropathy from prior chemotherapy with Velcade. I recommend a trial  of gabapentin. The risk of constipation and sedation is discussed with the patient and he agreed to try  Cancer associated pain The patient is adamant that he does not want to take narcotic therapy. He is currently taking tramadol as needed. We'll continue the same He is on calcium and vitamin D supplement    No orders of the defined types were placed in this encounter.   All questions were answered. The patient knows to call the clinic with any problems, questions or concerns. No barriers to learning was detected. I spent 30 minutes counseling the patient face to face. The total time spent in the appointment was 40 minutes and more than 50% was on counseling and review of test results     [facility], [place], MD 02/09/2016 9:58 AM

## 2016-02-09 NOTE — Procedures (Signed)
Interventional Radiology Procedure Note  Procedure: Placement of a right IJ approach single lumen PowerPort.  Tip is positioned at the superior cavoatrial junction and catheter is ready for immediate use.  Complications: No immediate Recommendations:  - Ok to shower tomorrow - Do not submerge for 7 days - Routine line care   Signed,  Chivon Lepage K. Thelma Viana, MD   

## 2016-02-09 NOTE — Assessment & Plan Note (Signed)
The patient has neuropathy from prior chemotherapy with Velcade. I recommend a trial of gabapentin. The risk of constipation and sedation is discussed with the patient and he agreed to try

## 2016-02-09 NOTE — Assessment & Plan Note (Addendum)
I reviewed all the test results performed at [facility]. Per discussion with his transplant physician, we will proceed with second line treatment as a bridge to get a deeper response prior to autologous stem cell transplant The treatment decision is based on the publication below:  Carfilzomib, cyclophosphamide, and dexamethasone in patients with newly diagnosed multiple myeloma: a multicenter, phase 2 study Claudie Leach, Erick Blinks Petrucci, Geronimo Running, [place] Conticello, [place], [place] Magarotto, [employer], Luana Boccadifuoco, [employer], [place], [address], Giovannino [place], [place], [place], [place], Pieter [place]  Blood 2014 [phone]; doi: BankRights.uy   This multicenter, open-label phase 2 trial determined the safety and efficacy of carfilzomib, a novel and irreversible proteasome inhibitor, in combination with cyclophosphamide and dexamethasone (CCyd) in patients with newly diagnosed multiple myeloma (NDMM) ?60 years of age or who were ineligible for autologous stem cell transplantation. Patients (N = 58) received CCyd for up to 9 28-day cycles, followed by maintenance with carfilzomib until progression or intolerance.   After a median of 9 CCyd induction cycles (range 1-9), 95% of patients achieved at least a partial response, 71% achieved at least a very good partial response, 49% achieved at least a near complete response, and 20% achieved stringent complete response.  After a median follow-up of 18 months, the 2-year progression-free survival and overall survival rates were 76% and 87%, respectively. The most frequent grade 3 to 5 toxicities were neutropenia (20%), anemia (11%), and cardiopulmonary adverse events (7%). Peripheral neuropathy was limited to grades 1 and 2  (9%). Fourteen percent of patients discontinued treatment because of adverse events, and 21% of patients required carfilzomib dose reductions. In summary, results showed high complete response rates and a good safety profile  We discussed some of the risks, benefits, side-effects of Cytoxan, Kyprolis and Dexamethasone.   Some of the short term side-effects included, though not limited to, risk of fatigue, risk of allergic reactions, mouth sores, weight loss, pancytopenia, life-threatening infections, need for transfusions of blood products, nausea, vomiting, change in bowel habits, blood clots, admission to hospital for various reasons, and risks of death.   Long term side-effects are also discussed including risks of infertility, permanent damage to nerve function, chronic fatigue, and rare secondary malignancy including bone marrow disorders and leukemia.   The patient is aware that the response rates discussed earlier is not guaranteed.  After a long discussion, patient made an informed decision to proceed with the prescribed plan of care.   Patient education materials were dispensed today Instead of oral Cytoxan, he will be receiving IV Cytoxan here on days 1, 8 and 15 The patient will also continue weekly dexamethasone and 20 mg by mouth. He will continue acyclovir for antimicrobial prophylaxis I will see him prior to week 3 of therapy.

## 2016-02-09 NOTE — Consult Note (Signed)
   Chief Complaint: Patient was seen in consultation today for port a cath placement  Referring Physician(s): Gorsuch,Ni  Supervising Physician: McCullough, Heath  Patient Status: Outpatient  History of Present Illness: Erik Nguyen is a 60 y.o. male with history of multiple myeloma initially diagnosed in January 2017 and status post chemotherapy who presents today for Port-A-Cath placement for additional chemotherapy due to persistent disease.  Past Medical History  Diagnosis Date  . Priapism 2014   . Hypertension Dx 2016  . Multiple myeloma not having achieved remission ([facility]) 09/01/2015  . Encounter for antineoplastic chemotherapy 02/09/2016    Past Surgical History  Procedure Laterality Date  . Colonoscopy    . Polypectomy      Allergies: Benadryl; Trazodone and nefazodone; and Latex  Medications: Prior to Admission medications   Medication Sig Start Date End Date Taking? Authorizing Provider  acyclovir (ZOVIRAX) 400 MG tablet Take 1 tablet (400 mg total) by mouth 2 (two) times daily. 01/04/16  Yes Ni Gorsuch, MD  aspirin EC 81 MG tablet Take 81 mg by mouth daily.   Yes Historical Provider, MD  bisacodyl (DULCOLAX) 5 MG EC tablet Take 5 mg by mouth 2 (two) times daily as needed for moderate constipation.   Yes Historical Provider, MD  CALCIUM PO Take 1 tablet by mouth daily.   Yes Historical Provider, MD  cholecalciferol (VITAMIN D) 1000 units tablet Take 2,000 Units by mouth daily.   Yes Historical Provider, MD  cyclobenzaprine (FLEXERIL) 10 MG tablet Take 10 mg by mouth 3 (three) times daily as needed for muscle spasms.   Yes Historical Provider, MD  diclofenac sodium (VOLTAREN) 1 % GEL Apply 2 g topically 4 (four) times daily. Patient taking differently: Apply 2 g topically 4 (four) times daily as needed (pain).  10/26/15  Yes Ni Gorsuch, MD  lisinopril-hydrochlorothiazide (PRINZIDE,ZESTORETIC) 10-12.5 MG tablet Take 1 tablet by mouth daily. Reported on 10/06/2015  10/06/15  Yes Josalyn Funches, MD  polyethylene glycol (MIRALAX / GLYCOLAX) packet Take 17 g by mouth 2 (two) times daily. 09/16/15  Yes Daniel V Thompson, MD  traMADol (ULTRAM) 50 MG tablet TAKE 1 TO 2 TABLETS BY MOUTH EVERY 6 HOURS AS NEEDED FOR PAIN 02/04/16  Yes Ni Gorsuch, MD  dexamethasone (DECADRON) 4 MG tablet Take 5 pills every week on Thursdays with food 02/09/16   Ni Gorsuch, MD  gabapentin (NEURONTIN) 300 MG capsule Take 1 capsule (300 mg total) by mouth 3 (three) times daily. 02/09/16   Ni Gorsuch, MD  lidocaine-prilocaine (EMLA) cream Apply to affected area once 02/09/16   Ni Gorsuch, MD  ondansetron (ZOFRAN) 8 MG tablet Take 1 tablet (8 mg total) by mouth every 8 (eight) hours as needed for nausea. 02/09/16   Ni Gorsuch, MD  pantoprazole (PROTONIX) 40 MG tablet Take 1 tablet (40 mg total) by mouth daily. 12/14/15   Ni Gorsuch, MD  prochlorperazine (COMPAZINE) 10 MG tablet Take 1 tablet (10 mg total) by mouth every 6 (six) hours as needed (Nausea or vomiting). 02/09/16   Ni Gorsuch, MD  senna-docusate (SENOKOT-S) 8.6-50 MG tablet Take 1 tablet by mouth 2 (two) times daily. 10/12/15   Ni Gorsuch, MD     Family History  Problem Relation Age of Onset  . Asthma Mother   . Cancer Father   . Prostate cancer Father   . Colon cancer Neg Hx   . Rectal cancer Neg Hx   . Stomach cancer Neg Hx     Social History     Social History  . Marital Status: Legally Separated    Spouse Name: N/A  . Number of Children: N/A  . Years of Education: N/A   Social History Main Topics  . Smoking status: Former Smoker -- 1.00 packs/day    Types: Cigarettes  . Smokeless tobacco: Never Used  . Alcohol Use: No     Comment: last use Jan 2015  . Drug Use: No     Comment: last use Sep 21, 2013  . Sexual Activity: Not on file     Comment: worked fund raising, living in a program, married   Other Topics Concern  . Not on file   Social History Narrative      Review of Systems  Constitutional: Positive for  fatigue. Negative for fever and chills.  Respiratory: Negative for cough.        Some dyspnea with exertion  Cardiovascular: Negative for chest pain.  Gastrointestinal: Negative for nausea, vomiting, abdominal pain and blood in stool.  Genitourinary: Negative for dysuria and hematuria.  Musculoskeletal: Positive for back pain.       Bilateral LE pain  Neurological: Negative for headaches.  Psychiatric/Behavioral: The patient is nervous/anxious.     Vital Signs: Blood pressure 128/91, temperature 98.3, heart rate 86, respirations 18, O2 sat 98% room air   Physical Exam  Constitutional: He is oriented to person, place, and time. He appears well-developed and well-nourished.  Cardiovascular: Normal rate and regular rhythm.   Pulmonary/Chest: Effort normal and breath sounds normal.  Abdominal: Soft. Bowel sounds are normal.  Musculoskeletal: Normal range of motion. He exhibits no edema.  Neurological: He is alert and oriented to person, place, and time.    Mallampati Score:     Imaging: No results found.  Labs:  CBC:  Recent Labs  01/04/16 1045 01/12/16 1016 01/19/16 1009 02/09/16 0854  WBC 3.8* 3.2* 5.0 3.7*  HGB 15.4 14.7 14.7 14.9  HCT 46.1 44.4 42.5 42.3  PLT 267 112* 173 257    COAGS: No results for input(s): INR, APTT in the last 8760 hours.  BMP:  Recent Labs  09/14/15 0440 09/15/15 0420 09/16/15 0425  12/14/15 0631  01/04/16 1046 01/12/16 1016 01/19/16 1009 02/09/16 0854  NA 141 143 142  < > 139  < > 142 141 140 139  K 3.7 3.8 4.1  < > 4.4  < > 3.8 3.6 3.6 3.9  CL 106 111 112*  --  106  --   --   --   --   --   CO2 27 25 23  < > 23  < > 26 27 27 24  GLUCOSE 108* 97 105*  < > 111*  < > 90 78 81 93  BUN 11 11 13  < > 13  < > 13.9 14.0 17.1 11.2  CALCIUM 8.9 8.4* 8.2*  < > 9.0  < > 9.8 9.3 9.4 9.7  CREATININE 1.18 0.97 1.13  < > 1.12  < > 1.1 1.1 1.2 1.1  GFRNONAA >60 >60 >60  --  >60  --   --   --   --   --   GFRAA >60 >60 >60  --  >60  --   --    --   --   --   < > = values in this interval not displayed.  LIVER FUNCTION TESTS:  Recent Labs  01/04/16 1046 01/12/16 1016 01/19/16 1009 02/09/16 0854  BILITOT 0.58 0.53 0.47 0.68  AST 17 14   14 14  ALT _0 ALKPHOS 80 69 72 79  PROT 6.9 6.7 6.9 6.9  ALBUMIN 4.0 4.0 3.9 4.0    TUMOR MARKERS: No results for input(s): AFPTM, CEA, CA199, CHROMGRNA in the last 8760 hours.  Assessment and Plan: 60 y.o. male with history of multiple myeloma initially diagnosed in January 2017 and status post chemotherapy who presents today for Port-A-Cath placement for additional chemotherapy due to persistent disease.Risks and benefits discussed with the patient including, but not limited to bleeding, infection, pneumothorax, or fibrin sheath development and need for additional procedures.All of the patient's questions were answered, patient is agreeable to proceed.Consent signed and in chart.     Thank you for this interesting consult.  I greatly enjoyed meeting Bain Whichard and look forward to participating in their care.  A copy of this report was sent to the requesting provider on this date.  Electronically Signed: D. Rowe Robert 02/09/2016, 11:04 AM   I spent a total of  20 minutes   in face to face in clinical consultation, greater than 50% of which was counseling/coordinating care for port a cath placement

## 2016-02-09 NOTE — Discharge Instructions (Signed)
Implanted Port Insertion, Care After °Refer to this sheet in the next few weeks. These instructions provide you with information on caring for yourself after your procedure. Your health care provider may also give you more specific instructions. Your treatment has been planned according to current medical practices, but problems sometimes occur. Call your health care provider if you have any problems or questions after your procedure. °WHAT TO EXPECT AFTER THE PROCEDURE °After your procedure, it is typical to have the following:  °· Discomfort at the port insertion site. Ice packs to the area will help. °· Bruising on the skin over the port. This will subside in 3-4 days. °HOME CARE INSTRUCTIONS °· After your port is placed, you will get a manufacturer's information card. The card has information about your port. Keep this card with you at all times.   °· Know what kind of port you have. There are many types of ports available.   °· Wear a medical alert bracelet in case of an emergency. This can help alert health care workers that you have a port.   °· The port can stay in for as long as your health care provider believes it is necessary.   °· A home health care nurse may give medicines and take care of the port.   °· You or a family member can get special training and directions for giving medicine and taking care of the port at home.   °SEEK MEDICAL CARE IF:  °· Your port does not flush or you are unable to get a blood return.   °· You have a fever or chills. °SEEK IMMEDIATE MEDICAL CARE IF: °· You have new fluid or pus coming from your incision.   °· You notice a bad smell coming from your incision site.   °· You have swelling, pain, or more redness at the incision or port site.   °· You have chest pain or shortness of breath. °  °This information is not intended to replace advice given to you by your health care provider. Make sure you discuss any questions you have with your health care provider. °  °Document  Released: 05/22/2013 Document Revised: 08/06/2013 Document Reviewed: 05/22/2013 °Elsevier Interactive Patient Education ©2016 [employer] °Moderate Conscious Sedation, Adult, Care After °Refer to this sheet in the next few weeks. These instructions provide you with information on caring for yourself after your procedure. Your health care provider may also give you more specific instructions. Your treatment has been planned according to current medical practices, but problems sometimes occur. Call your health care provider if you have any problems or questions after your procedure. °WHAT TO EXPECT AFTER THE PROCEDURE  °After your procedure: °· You may feel sleepy, clumsy, and have poor balance for several hours. °· Vomiting may occur if you eat too soon after the procedure. °HOME CARE INSTRUCTIONS °· Do not participate in any activities where you could become injured for at least 24 hours. Do not: °¨ Drive. °¨ Swim. °¨ Ride a bicycle. °¨ Operate heavy machinery. °¨ Cook. °¨ Use power tools. °¨ Climb ladders. °¨ Work from a high place. °· Do not make important decisions or sign legal documents until you are improved. °· If you vomit, drink water, juice, or soup when you can drink without vomiting. Make sure you have little or no nausea before eating solid foods. °· Only take over-the-counter or prescription medicines for pain, discomfort, or fever as directed by your health care provider. °· Make sure you and your family fully understand everything about the medicines given   to you, including what side effects may occur. °· You should not drink alcohol, take sleeping pills, or take medicines that cause drowsiness for at least 24 hours. °· If you smoke, do not smoke without supervision. °· If you are feeling better, you may resume normal activities 24 hours after you were sedated. °· Keep all appointments with your health care provider. °SEEK MEDICAL CARE IF: °· Your skin is pale or bluish in color. °· You continue to  feel nauseous or vomit. °· Your pain is getting worse and is not helped by medicine. °· You have bleeding or swelling. °· You are still sleepy or feeling clumsy after 24 hours. °SEEK IMMEDIATE MEDICAL CARE IF: °· You develop a rash. °· You have difficulty breathing. °· You develop any type of allergic problem. °· You have a fever. °MAKE SURE YOU: °· Understand these instructions. °· Will watch your condition. °· Will get help right away if you are not doing well or get worse. °  °This information is not intended to replace advice given to you by your health care provider. Make sure you discuss any questions you have with your health care provider. °  °Document Released: 05/22/2013 Document Revised: 08/22/2014 Document Reviewed: 05/22/2013 °Elsevier Interactive Patient Education ©2016 Elsevier Inc. ° °

## 2016-02-10 LAB — TROPONIN I: Troponin I: <0.01 ng/mL (ref 0.00–0.04)

## 2016-02-10 LAB — BRAIN NATRIURETIC PEPTIDE: BNP: 3.1 pg/mL (ref 0.0–100.0)

## 2016-02-11 ENCOUNTER — Ambulatory Visit (HOSPITAL_BASED_OUTPATIENT_CLINIC_OR_DEPARTMENT_OTHER): Payer: Medicaid Other

## 2016-02-11 VITALS — BP 125/91 | HR 92 | Temp 98.5°F | Resp 18

## 2016-02-11 DIAGNOSIS — C9001 Multiple myeloma in remission: Secondary | ICD-10-CM

## 2016-02-11 DIAGNOSIS — Z5111 Encounter for antineoplastic chemotherapy: Secondary | ICD-10-CM | POA: Diagnosis not present

## 2016-02-11 DIAGNOSIS — Z5112 Encounter for antineoplastic immunotherapy: Secondary | ICD-10-CM

## 2016-02-11 MED ORDER — SODIUM CHLORIDE 0.9 % IV SOLN
10.0000 mg | Freq: Once | INTRAVENOUS | Status: AC
  Administered 2016-02-11: 10 mg via INTRAVENOUS
  Filled 2016-02-11: qty 1

## 2016-02-11 MED ORDER — DEXTROSE 5 % IV SOLN
20.0000 mg/m2 | Freq: Once | INTRAVENOUS | Status: AC
  Administered 2016-02-11: 38 mg via INTRAVENOUS
  Filled 2016-02-11: qty 19

## 2016-02-11 MED ORDER — PALONOSETRON HCL INJECTION 0.25 MG/5ML
0.2500 mg | Freq: Once | INTRAVENOUS | Status: AC
  Administered 2016-02-11: 0.25 mg via INTRAVENOUS

## 2016-02-11 MED ORDER — HEPARIN SOD (PORK) LOCK FLUSH 100 UNIT/ML IV SOLN
500.0000 [IU] | Freq: Once | INTRAVENOUS | Status: AC | PRN
  Administered 2016-02-11: 500 [IU]
  Filled 2016-02-11: qty 5

## 2016-02-11 MED ORDER — CYCLOPHOSPHAMIDE CHEMO INJECTION 1 GM
300.0000 mg/m2 | Freq: Once | INTRAMUSCULAR | Status: AC
  Administered 2016-02-11: 560 mg via INTRAVENOUS
  Filled 2016-02-11: qty 28

## 2016-02-11 MED ORDER — PALONOSETRON HCL INJECTION 0.25 MG/5ML
INTRAVENOUS | Status: AC
  Filled 2016-02-11: qty 5

## 2016-02-11 MED ORDER — SODIUM CHLORIDE 0.9% FLUSH
10.0000 mL | INTRAVENOUS | Status: DC | PRN
  Administered 2016-02-11: 10 mL
  Filled 2016-02-11: qty 10

## 2016-02-11 MED ORDER — SODIUM CHLORIDE 0.9 % IV SOLN
Freq: Once | INTRAVENOUS | Status: AC
  Administered 2016-02-11: 13:00:00 via INTRAVENOUS

## 2016-02-11 NOTE — Patient Instructions (Signed)
[facility] Discharge Instructions for Patients Receiving Chemotherapy  Today you received the following chemotherapy agents: Kyprolis and Cytoxan.   To help prevent nausea and vomiting after your treatment, we encourage you to take your nausea medication.  You may take prochlorperazine (COMPAZINE) 10 MG tablet every 6 hours as needed for nausea or vomiting.  Do not take yourondansetron (ZOFRAN) 8 MG tablet for the next three days.  If you develop nausea and vomiting that is not controlled by your nausea medication, call the clinic.   BELOW ARE SYMPTOMS THAT SHOULD BE REPORTED IMMEDIATELY:  *FEVER GREATER THAN 100.5 F  *CHILLS WITH OR WITHOUT FEVER  NAUSEA AND VOMITING THAT IS NOT CONTROLLED WITH YOUR NAUSEA MEDICATION  *UNUSUAL SHORTNESS OF BREATH  *UNUSUAL BRUISING OR BLEEDING  TENDERNESS IN MOUTH AND THROAT WITH OR WITHOUT PRESENCE OF ULCERS  *URINARY PROBLEMS  *BOWEL PROBLEMS  UNUSUAL RASH Items with * indicate a potential emergency and should be followed up as soon as possible.  Feel free to call the clinic you have any questions or concerns. The clinic phone number is [phone].  Please show the [facility] at check-in to the Emergency Department and triage nurse.  Carfilzomib injection What is this medicine? CARFILZOMIB (kar FILZ oh mib) targets a specific protein within cancer cells and stops the cancer cells from growing. It is used to treat multiple myeloma. This medicine may be used for other purposes; ask your health care provider or pharmacist if you have questions. What should I tell my health care provider before I take this medicine? They need to know if you have any of these conditions: -heart disease -history of blood clots -irregular heartbeat -kidney disease -liver disease -lung or breathing disease -an unusual or allergic reaction to carfilzomib, or other medicines, foods, dyes, or preservatives -pregnant or trying to get  pregnant -breast-feeding How should I use this medicine? This medicine is for injection or infusion into a vein. It is given by a health care professional in a hospital or clinic setting. Talk to your pediatrician regarding the use of this medicine in children. Special care may be needed. Overdosage: If you think you have taken too much of this medicine contact a poison control center or emergency room at once. NOTE: This medicine is only for you. Do not share this medicine with others. What if I miss a dose? It is important not to miss your dose. Call your doctor or health care professional if you are unable to keep an appointment. What may interact with this medicine? Interactions are not expected. Give your health care provider a list of all the medicines, herbs, non-prescription drugs, or dietary supplements you use. Also tell them if you smoke, drink alcohol, or use illegal drugs. Some items may interact with your medicine. This list may not describe all possible interactions. Give your health care provider a list of all the medicines, herbs, non-prescription drugs, or dietary supplements you use. Also tell them if you smoke, drink alcohol, or use illegal drugs. Some items may interact with your medicine. What should I watch for while using this medicine? Your condition will be monitored carefully while you are receiving this medicine. Report any side effects. Continue your course of treatment even though you feel ill unless your doctor tells you to stop. You may need blood work done while you are taking this medicine. Do not become pregnant while taking this medicine or for at least 30 days after stopping it. Women should  inform their doctor if they wish to become pregnant or think they might be pregnant. There is a potential for serious side effects to an unborn child. Men should not father a child while taking this medicine and for 90 days after stopping it. Talk to your health care  professional or pharmacist for more information. Do not breast-feed an infant while taking this medicine. Check with your doctor or health care professional if you get an attack of severe diarrhea, nausea and vomiting, or if you sweat a lot. The loss of too much body fluid can make it dangerous for you to take this medicine. You may get dizzy. Do not drive, use machinery, or do anything that needs mental alertness until you know how this medicine affects you. Do not stand or sit up quickly, especially if you are an older patient. This reduces the risk of dizzy or fainting spells. What side effects may I notice from receiving this medicine? Side effects that you should report to your doctor or health care professional as soon as possible: -allergic reactions like skin rash, itching or hives, swelling of the face, lips, or tongue -confusion -dizziness -feeling faint or lightheaded -fever or chills -palpitations -seizures -signs and symptoms of bleeding such as bloody or black, tarry stools; red or dark-brown urine; spitting up blood or brown material that looks like coffee grounds; red spots on the skin; unusual bruising or bleeding including from the eye, gums, or nose -signs and symptoms of a blood clot such as breathing problems; changes in vision; chest pain; severe, sudden headache; pain, swelling, warmth in the leg; trouble speaking; sudden numbness or weakness of the face, arm or leg -signs and symptoms of kidney injury like trouble passing urine or change in the amount of urine -signs and symptoms of liver injury like dark yellow or brown urine; general ill feeling or flu-like symptoms; light-colored stools; loss of appetite; nausea; right upper belly pain; unusually weak or tired; yellowing of the eyes or skin Side effects that usually do not require medical attention (report to your doctor or health care professional if they continue or are bothersome): -back  pain -cough -diarrhea -headache -muscle cramps -vomiting This list may not describe all possible side effects. Call your doctor for medical advice about side effects. You may report side effects to FDA at 1-800-FDA-1088. Where should I keep my medicine? This drug is given in a hospital or clinic and will not be stored at home. NOTE: This sheet is a summary. It may not cover all possible information. If you have questions about this medicine, talk to your doctor, pharmacist, or health care provider.    2016, Elsevier/Gold Standard. (2015-03-24 16:16:00)  Cyclophosphamide injection What is this medicine? CYCLOPHOSPHAMIDE (sye kloe FOSS fa mide) is a chemotherapy drug. It slows the growth of cancer cells. This medicine is used to treat many types of cancer like lymphoma, myeloma, leukemia, breast cancer, and ovarian cancer, to name a few. This medicine may be used for other purposes; ask your health care provider or pharmacist if you have questions. What should I tell my health care provider before I take this medicine? They need to know if you have any of these conditions: -blood disorders -history of other chemotherapy -infection -kidney disease -liver disease -recent or ongoing radiation therapy -tumors in the bone marrow -an unusual or allergic reaction to cyclophosphamide, other chemotherapy, other medicines, foods, dyes, or preservatives -pregnant or trying to get pregnant -breast-feeding How should I use this medicine? This  drug is usually given as an injection into a vein or muscle or by infusion into a vein. It is administered in a hospital or clinic by a specially trained health care professional. Talk to your pediatrician regarding the use of this medicine in children. Special care may be needed. Overdosage: If you think you have taken too much of this medicine contact a poison control center or emergency room at once. NOTE: This medicine is only for you. Do not share this  medicine with others. What if I miss a dose? It is important not to miss your dose. Call your doctor or health care professional if you are unable to keep an appointment. What may interact with this medicine? This medicine may interact with the following medications: -amiodarone -amphotericin B -azathioprine -certain antiviral medicines for HIV or AIDS such as protease inhibitors (e.g., indinavir, ritonavir) and zidovudine -certain blood pressure medications such as benazepril, captopril, enalapril, fosinopril, lisinopril, moexipril, monopril, perindopril, quinapril, ramipril, trandolapril -certain cancer medications such as anthracyclines (e.g., daunorubicin, doxorubicin), busulfan, cytarabine, paclitaxel, pentostatin, tamoxifen, trastuzumab -certain diuretics such as chlorothiazide, chlorthalidone, hydrochlorothiazide, indapamide, metolazone -certain medicines that treat or prevent blood clots like warfarin -certain muscle relaxants such as succinylcholine -cyclosporine -etanercept -indomethacin -medicines to increase blood counts like filgrastim, pegfilgrastim, sargramostim -medicines used as general anesthesia -metronidazole -natalizumab This list may not describe all possible interactions. Give your health care provider a list of all the medicines, herbs, non-prescription drugs, or dietary supplements you use. Also tell them if you smoke, drink alcohol, or use illegal drugs. Some items may interact with your medicine. What should I watch for while using this medicine? Visit your doctor for checks on your progress. This drug may make you feel generally unwell. This is not uncommon, as chemotherapy can affect healthy cells as well as cancer cells. Report any side effects. Continue your course of treatment even though you feel ill unless your doctor tells you to stop. Drink water or other fluids as directed. Urinate often, even at night. In some cases, you may be given additional medicines  to help with side effects. Follow all directions for their use. Call your doctor or health care professional for advice if you get a fever, chills or sore throat, or other symptoms of a cold or flu. Do not treat yourself. This drug decreases your body's ability to fight infections. Try to avoid being around people who are sick. This medicine may increase your risk to bruise or bleed. Call your doctor or health care professional if you notice any unusual bleeding. Be careful brushing and flossing your teeth or using a toothpick because you may get an infection or bleed more easily. If you have any dental work done, tell your dentist you are receiving this medicine. You may get drowsy or dizzy. Do not drive, use machinery, or do anything that needs mental alertness until you know how this medicine affects you. Do not become pregnant while taking this medicine or for 1 year after stopping it. Women should inform their doctor if they wish to become pregnant or think they might be pregnant. Men should not father a child while taking this medicine and for 4 months after stopping it. There is a potential for serious side effects to an unborn child. Talk to your health care professional or pharmacist for more information. Do not breast-feed an infant while taking this medicine. This medicine may interfere with the ability to have a child. This medicine has caused ovarian failure in some women.  This medicine has caused reduced sperm counts in some men. You should talk with your doctor or health care professional if you are concerned about your fertility. If you are going to have surgery, tell your doctor or health care professional that you have taken this medicine. What side effects may I notice from receiving this medicine? Side effects that you should report to your doctor or health care professional as soon as possible: -allergic reactions like skin rash, itching or hives, swelling of the face, lips, or  tongue -low blood counts - this medicine may decrease the number of white blood cells, red blood cells and platelets. You may be at increased risk for infections and bleeding. -signs of infection - fever or chills, cough, sore throat, pain or difficulty passing urine -signs of decreased platelets or bleeding - bruising, pinpoint red spots on the skin, black, tarry stools, blood in the urine -signs of decreased red blood cells - unusually weak or tired, fainting spells, lightheadedness -breathing problems -dark urine -dizziness -palpitations -swelling of the ankles, feet, hands -trouble passing urine or change in the amount of urine -weight gain -yellowing of the eyes or skin Side effects that usually do not require medical attention (report to your doctor or health care professional if they continue or are bothersome): -changes in nail or skin color -hair loss -missed menstrual periods -mouth sores -nausea, vomiting This list may not describe all possible side effects. Call your doctor for medical advice about side effects. You may report side effects to FDA at 1-800-FDA-1088. Where should I keep my medicine? This drug is given in a hospital or clinic and will not be stored at home. NOTE: This sheet is a summary. It may not cover all possible information. If you have questions about this medicine, talk to your doctor, pharmacist, or health care provider.    2016, Elsevier/Gold Standard. (2012-06-15 16:22:58)

## 2016-02-12 ENCOUNTER — Ambulatory Visit (HOSPITAL_BASED_OUTPATIENT_CLINIC_OR_DEPARTMENT_OTHER): Payer: Medicaid Other

## 2016-02-12 VITALS — BP 157/105 | HR 95 | Temp 98.0°F | Resp 18

## 2016-02-12 DIAGNOSIS — Z5112 Encounter for antineoplastic immunotherapy: Secondary | ICD-10-CM

## 2016-02-12 DIAGNOSIS — C9001 Multiple myeloma in remission: Secondary | ICD-10-CM

## 2016-02-12 MED ORDER — SODIUM CHLORIDE 0.9 % IV SOLN
10.0000 mg | Freq: Once | INTRAVENOUS | Status: AC
  Administered 2016-02-12: 10 mg via INTRAVENOUS
  Filled 2016-02-12: qty 1

## 2016-02-12 MED ORDER — HEPARIN SOD (PORK) LOCK FLUSH 100 UNIT/ML IV SOLN
500.0000 [IU] | Freq: Once | INTRAVENOUS | Status: AC | PRN
  Administered 2016-02-12: 500 [IU]
  Filled 2016-02-12: qty 5

## 2016-02-12 MED ORDER — SODIUM CHLORIDE 0.9% FLUSH
10.0000 mL | INTRAVENOUS | Status: DC | PRN
  Administered 2016-02-12: 10 mL
  Filled 2016-02-12: qty 10

## 2016-02-12 MED ORDER — SODIUM CHLORIDE 0.9 % IV SOLN: Freq: Once | INTRAVENOUS | Status: DC

## 2016-02-12 MED ORDER — CARFILZOMIB CHEMO INJECTION 60 MG
20.0000 mg/m2 | Freq: Once | INTRAVENOUS | Status: AC
  Administered 2016-02-12: 38 mg via INTRAVENOUS
  Filled 2016-02-12: qty 19

## 2016-02-12 MED ORDER — SODIUM CHLORIDE 0.9 % IV SOLN
Freq: Once | INTRAVENOUS | Status: AC
  Administered 2016-02-12: 14:00:00 via INTRAVENOUS

## 2016-02-12 NOTE — Patient Instructions (Signed)
Carfilzomib injection What is this medicine? CARFILZOMIB (kar FILZ oh mib) targets a specific protein within cancer cells and stops the cancer cells from growing. It is used to treat multiple myeloma. This medicine may be used for other purposes; ask your health care provider or pharmacist if you have questions. What should I tell my health care provider before I take this medicine? They need to know if you have any of these conditions: -heart disease -history of blood clots -irregular heartbeat -kidney disease -liver disease -lung or breathing disease -an unusual or allergic reaction to carfilzomib, or other medicines, foods, dyes, or preservatives -pregnant or trying to get pregnant -breast-feeding How should I use this medicine? This medicine is for injection or infusion into a vein. It is given by a health care professional in a hospital or clinic setting. Talk to your pediatrician regarding the use of this medicine in children. Special care may be needed. Overdosage: If you think you have taken too much of this medicine contact a poison control center or emergency room at once. NOTE: This medicine is only for you. Do not share this medicine with others. What if I miss a dose? It is important not to miss your dose. Call your doctor or health care professional if you are unable to keep an appointment. What may interact with this medicine? Interactions are not expected. Give your health care provider a list of all the medicines, herbs, non-prescription drugs, or dietary supplements you use. Also tell them if you smoke, drink alcohol, or use illegal drugs. Some items may interact with your medicine. This list may not describe all possible interactions. Give your health care provider a list of all the medicines, herbs, non-prescription drugs, or dietary supplements you use. Also tell them if you smoke, drink alcohol, or use illegal drugs. Some items may interact with your medicine. What  should I watch for while using this medicine? Your condition will be monitored carefully while you are receiving this medicine. Report any side effects. Continue your course of treatment even though you feel ill unless your doctor tells you to stop. You may need blood work done while you are taking this medicine. Do not become pregnant while taking this medicine or for at least 30 days after stopping it. Women should inform their doctor if they wish to become pregnant or think they might be pregnant. There is a potential for serious side effects to an unborn child. Men should not father a child while taking this medicine and for 90 days after stopping it. Talk to your health care professional or pharmacist for more information. Do not breast-feed an infant while taking this medicine. Check with your doctor or health care professional if you get an attack of severe diarrhea, nausea and vomiting, or if you sweat a lot. The loss of too much body fluid can make it dangerous for you to take this medicine. You may get dizzy. Do not drive, use machinery, or do anything that needs mental alertness until you know how this medicine affects you. Do not stand or sit up quickly, especially if you are an older patient. This reduces the risk of dizzy or fainting spells. What side effects may I notice from receiving this medicine? Side effects that you should report to your doctor or health care professional as soon as possible: -allergic reactions like skin rash, itching or hives, swelling of the face, lips, or tongue -confusion -dizziness -feeling faint or lightheaded -fever or chills -palpitations -seizures -signs and   symptoms of bleeding such as bloody or black, tarry stools; red or dark-brown urine; spitting up blood or brown material that looks like coffee grounds; red spots on the skin; unusual bruising or bleeding including from the eye, gums, or nose -signs and symptoms of a blood clot such as breathing  problems; changes in vision; chest pain; severe, sudden headache; pain, swelling, warmth in the leg; trouble speaking; sudden numbness or weakness of the face, arm or leg -signs and symptoms of kidney injury like trouble passing urine or change in the amount of urine -signs and symptoms of liver injury like dark yellow or brown urine; general ill feeling or flu-like symptoms; light-colored stools; loss of appetite; nausea; right upper belly pain; unusually weak or tired; yellowing of the eyes or skin Side effects that usually do not require medical attention (report to your doctor or health care professional if they continue or are bothersome): -back pain -cough -diarrhea -headache -muscle cramps -vomiting This list may not describe all possible side effects. Call your doctor for medical advice about side effects. You may report side effects to FDA at 1-800-FDA-1088. Where should I keep my medicine? This drug is given in a hospital or clinic and will not be stored at home. NOTE: This sheet is a summary. It may not cover all possible information. If you have questions about this medicine, talk to your doctor, pharmacist, or health care provider.    2016, Elsevier/[employer] Standard. (2015-03-24 16:16:00)  

## 2016-02-12 NOTE — Progress Notes (Signed)
Dr. Alvy Bimler notified of pt's elevated BP.  She instructs ok to treat today as ordered.  Pt to decrease salt intake and make sure he is taking his BP med daily. Will recheck next week.  Informed pt/wife of instructions.  He verbalized understanding.

## 2016-02-13 ENCOUNTER — Telehealth: Payer: Self-pay | Admitting: Hematology

## 2016-02-13 NOTE — Telephone Encounter (Signed)
Pt's wife call to report persistent hypertension, BP 153/102 and 175/112 today, no chest pain, headache or other new complains.   I recommend him to increase his lisinopril/HCTZ 10/12.5 from 1 tab daily to 2 tab daily, and chek BP daily, and update [place] next week.  Truitt Merle  02/13/2016

## 2016-02-15 NOTE — Telephone Encounter (Signed)
Thanks

## 2016-02-17 MED FILL — LISINOPRIL-HCTZ 10-12.5 MG: 10-12.5 | 30 days supply | Qty: 30 | Fill #3

## 2016-02-18 ENCOUNTER — Ambulatory Visit (HOSPITAL_BASED_OUTPATIENT_CLINIC_OR_DEPARTMENT_OTHER): Payer: Medicaid Other

## 2016-02-18 ENCOUNTER — Ambulatory Visit: Payer: Medicaid Other

## 2016-02-18 ENCOUNTER — Encounter (HOSPITAL_BASED_OUTPATIENT_CLINIC_OR_DEPARTMENT_OTHER): Payer: Medicaid Other | Admitting: Hematology and Oncology

## 2016-02-18 ENCOUNTER — Other Ambulatory Visit: Payer: Self-pay | Admitting: *Deleted

## 2016-02-18 VITALS — BP 146/94 | HR 78 | Temp 98.0°F | Resp 12

## 2016-02-18 DIAGNOSIS — G620 Drug-induced polyneuropathy: Secondary | ICD-10-CM

## 2016-02-18 DIAGNOSIS — T451X5A Adverse effect of antineoplastic and immunosuppressive drugs, initial encounter: Secondary | ICD-10-CM

## 2016-02-18 DIAGNOSIS — Z5112 Encounter for antineoplastic immunotherapy: Secondary | ICD-10-CM

## 2016-02-18 DIAGNOSIS — M898X9 Other specified disorders of bone, unspecified site: Secondary | ICD-10-CM

## 2016-02-18 DIAGNOSIS — D61810 Antineoplastic chemotherapy induced pancytopenia: Secondary | ICD-10-CM

## 2016-02-18 DIAGNOSIS — C9001 Multiple myeloma in remission: Secondary | ICD-10-CM

## 2016-02-18 DIAGNOSIS — Z8042 Family history of malignant neoplasm of prostate: Secondary | ICD-10-CM

## 2016-02-18 DIAGNOSIS — G893 Neoplasm related pain (acute) (chronic): Secondary | ICD-10-CM

## 2016-02-18 LAB — CBC WITH DIFFERENTIAL/PLATELET
BASO%: 0.4 % (ref 0.0–2.0)
BASOS ABS: 0 10*3/uL (ref 0.0–0.1)
EOS%: 3.8 % (ref 0.0–7.0)
Eosinophils Absolute: 0.2 10*3/uL (ref 0.0–0.5)
HEMATOCRIT: 41.9 % (ref 38.4–49.9)
HGB: 14.1 g/dL (ref 13.0–17.1)
LYMPH#: 1.8 10*3/uL (ref 0.9–3.3)
LYMPH%: 31.6 % (ref 14.0–49.0)
MCH: 30.6 pg (ref 27.2–33.4)
MCHC: 33.6 g/dL (ref 32.0–36.0)
MCV: 91.1 fL (ref 79.3–98.0)
MONO#: 0.7 10*3/uL (ref 0.1–0.9)
MONO%: 12 % (ref 0.0–14.0)
NEUT#: 3 10*3/uL (ref 1.5–6.5)
NEUT%: 52.2 % (ref 39.0–75.0)
Platelets: 184 10*3/uL (ref 140–400)
RBC: 4.6 10*6/uL (ref 4.20–5.82)
RDW: 13.8 % (ref 11.0–14.6)
WBC: 5.7 10*3/uL (ref 4.0–10.3)

## 2016-02-18 LAB — COMPREHENSIVE METABOLIC PANEL
ALT: 22 U/L (ref 0–55)
AST: 16 U/L (ref 5–34)
Albumin: 3.9 g/dL (ref 3.5–5.0)
Alkaline Phosphatase: 79 U/L (ref 40–150)
Anion Gap: 9 mEq/L (ref 3–11)
BUN: 13.2 mg/dL (ref 7.0–26.0)
CALCIUM: 9.6 mg/dL (ref 8.4–10.4)
CHLORIDE: 103 meq/L (ref 98–109)
CO2: 27 mEq/L (ref 22–29)
Creatinine: 1 mg/dL (ref 0.7–1.3)
EGFR: >90 mL/min/{1.73_m2} (ref >90)
Glucose: 97 mg/dl (ref 70–140)
POTASSIUM: 4.2 meq/L (ref 3.5–5.1)
Sodium: 139 mEq/L (ref 136–145)
Total Bilirubin: 0.62 mg/dL (ref 0.20–1.20)
Total Protein: 7 g/dL (ref 6.4–8.3)

## 2016-02-18 MED ORDER — ZOLPIDEM TARTRATE 10 MG PO TABS: 10.0000 mg | ORAL_TABLET | Freq: Every evening | ORAL | Status: DC | PRN

## 2016-02-18 MED ORDER — SODIUM CHLORIDE 0.9% FLUSH
10.0000 mL | INTRAVENOUS | Status: DC | PRN
  Administered 2016-02-18: 10 mL
  Filled 2016-02-18: qty 10

## 2016-02-18 MED ORDER — SODIUM CHLORIDE 0.9 % IV SOLN
10.0000 mg | Freq: Once | INTRAVENOUS | Status: AC
  Administered 2016-02-18: 10 mg via INTRAVENOUS
  Filled 2016-02-18: qty 1

## 2016-02-18 MED ORDER — PALONOSETRON HCL INJECTION 0.25 MG/5ML
INTRAVENOUS | Status: AC
  Filled 2016-02-18: qty 5

## 2016-02-18 MED ORDER — SODIUM CHLORIDE 0.9 % IV SOLN
Freq: Once | INTRAVENOUS | Status: AC
  Administered 2016-02-18: 15:00:00 via INTRAVENOUS

## 2016-02-18 MED ORDER — SODIUM CHLORIDE 0.9 % IV SOLN
300.0000 mg/m2 | Freq: Once | INTRAVENOUS | Status: AC
  Administered 2016-02-18: 560 mg via INTRAVENOUS
  Filled 2016-02-18: qty 28

## 2016-02-18 MED ORDER — PALONOSETRON HCL INJECTION 0.25 MG/5ML
0.2500 mg | Freq: Once | INTRAVENOUS | Status: AC
  Administered 2016-02-18: 0.25 mg via INTRAVENOUS

## 2016-02-18 MED ORDER — DEXTROSE 5 % IV SOLN
36.0000 mg/m2 | Freq: Once | INTRAVENOUS | Status: AC
  Administered 2016-02-18: 68 mg via INTRAVENOUS
  Filled 2016-02-18: qty 30

## 2016-02-18 MED ORDER — HEPARIN SOD (PORK) LOCK FLUSH 100 UNIT/ML IV SOLN
500.0000 [IU] | Freq: Once | INTRAVENOUS | Status: AC | PRN
  Administered 2016-02-18: 500 [IU]
  Filled 2016-02-18: qty 5

## 2016-02-18 MED FILL — ZOLPIDEM TARTRATE 10 MG TAB: 10 | 30 days supply | Qty: 30 | Fill #0

## 2016-02-18 NOTE — Progress Notes (Signed)
Labs drawn peripherally

## 2016-02-18 NOTE — Patient Instructions (Signed)
[place] Discharge Instructions for Patients Receiving Chemotherapy  Today you received the following chemotherapy agents:  Kyprolis, Cytoxan  To help prevent nausea and vomiting after your treatment, we encourage you to take your nausea medication. If you develop nausea and vomiting that is not controlled by your nausea medication, call the clinic.   BELOW ARE SYMPTOMS THAT SHOULD BE REPORTED IMMEDIATELY:  *FEVER GREATER THAN 100.5 F  *CHILLS WITH OR WITHOUT FEVER  NAUSEA AND VOMITING THAT IS NOT CONTROLLED WITH YOUR NAUSEA MEDICATION  *UNUSUAL SHORTNESS OF BREATH  *UNUSUAL BRUISING OR BLEEDING  TENDERNESS IN MOUTH AND THROAT WITH OR WITHOUT PRESENCE OF ULCERS  *URINARY PROBLEMS  *BOWEL PROBLEMS  UNUSUAL RASH Items with * indicate a potential emergency and should be followed up as soon as possible.  Feel free to call the clinic you have any questions or concerns. The clinic phone number is [phone].  Please show the [place] at check-in to the Emergency Department and triage nurse.

## 2016-02-19 ENCOUNTER — Ambulatory Visit (HOSPITAL_BASED_OUTPATIENT_CLINIC_OR_DEPARTMENT_OTHER): Payer: Medicaid Other

## 2016-02-19 VITALS — BP 135/86 | HR 92 | Temp 98.2°F | Resp 20

## 2016-02-19 DIAGNOSIS — Z5112 Encounter for antineoplastic immunotherapy: Secondary | ICD-10-CM | POA: Diagnosis present

## 2016-02-19 DIAGNOSIS — C9001 Multiple myeloma in remission: Secondary | ICD-10-CM | POA: Diagnosis not present

## 2016-02-19 MED ORDER — HEPARIN SOD (PORK) LOCK FLUSH 100 UNIT/ML IV SOLN
500.0000 [IU] | Freq: Once | INTRAVENOUS | Status: AC | PRN
  Administered 2016-02-19: 500 [IU]
  Filled 2016-02-19: qty 5

## 2016-02-19 MED ORDER — DEXTROSE 5 % IV SOLN
36.0000 mg/m2 | Freq: Once | INTRAVENOUS | Status: AC
  Administered 2016-02-19: 68 mg via INTRAVENOUS
  Filled 2016-02-19: qty 30

## 2016-02-19 MED ORDER — SODIUM CHLORIDE 0.9% FLUSH
10.0000 mL | INTRAVENOUS | Status: DC | PRN
  Administered 2016-02-19: 10 mL
  Filled 2016-02-19: qty 10

## 2016-02-19 MED ORDER — SODIUM CHLORIDE 0.9 % IV SOLN
10.0000 mg | Freq: Once | INTRAVENOUS | Status: AC
  Administered 2016-02-19: 10 mg via INTRAVENOUS
  Filled 2016-02-19: qty 1

## 2016-02-19 MED ORDER — SODIUM CHLORIDE 0.9 % IV SOLN
Freq: Once | INTRAVENOUS | Status: AC
  Administered 2016-02-19: 14:00:00 via INTRAVENOUS

## 2016-02-19 NOTE — Patient Instructions (Signed)
[facility] Discharge Instructions for Patients Receiving Chemotherapy  Today you received the following chemotherapy agents Kyprolis  To help prevent nausea and vomiting after your treatment, we encourage you to take your nausea medication    If you develop nausea and vomiting that is not controlled by your nausea medication, call the clinic.   BELOW ARE SYMPTOMS THAT SHOULD BE REPORTED IMMEDIATELY:  *FEVER GREATER THAN 100.5 F  *CHILLS WITH OR WITHOUT FEVER  NAUSEA AND VOMITING THAT IS NOT CONTROLLED WITH YOUR NAUSEA MEDICATION  *UNUSUAL SHORTNESS OF BREATH  *UNUSUAL BRUISING OR BLEEDING  TENDERNESS IN MOUTH AND THROAT WITH OR WITHOUT PRESENCE OF ULCERS  *URINARY PROBLEMS  *BOWEL PROBLEMS  UNUSUAL RASH Items with * indicate a potential emergency and should be followed up as soon as possible.  Feel free to call the clinic you have any questions or concerns. The clinic phone number is [phone].  Please show the CHEMO ALERT CARD at check-in to the Emergency Department and triage nurse.   

## 2016-02-23 ENCOUNTER — Other Ambulatory Visit: Payer: Self-pay | Admitting: Hematology and Oncology

## 2016-02-24 ENCOUNTER — Other Ambulatory Visit: Payer: Self-pay | Admitting: Hematology and Oncology

## 2016-02-24 ENCOUNTER — Encounter: Payer: Self-pay | Admitting: Hematology and Oncology

## 2016-02-24 ENCOUNTER — Telehealth: Payer: Self-pay | Admitting: *Deleted

## 2016-02-24 DIAGNOSIS — Z8619 Personal history of other infectious and parasitic diseases: Secondary | ICD-10-CM

## 2016-02-24 HISTORY — DX: Personal history of other infectious and parasitic diseases: Z86.19

## 2016-02-24 NOTE — Telephone Encounter (Signed)
Call from [facility], Doren Custard, at [facility] Department [phone])  She states they need to draw RPR titer on pt for his history of Syphilis.   Pt told them he is having labs drawn here on Thursday and asks if it can be done here w/ his other labs from his PAC?  Dr. Alvy Bimler agreed to order the RPR and we will fax results to the Health Department at fax [phone].

## 2016-02-25 ENCOUNTER — Encounter: Payer: Self-pay | Admitting: Hematology and Oncology

## 2016-02-25 ENCOUNTER — Ambulatory Visit (HOSPITAL_BASED_OUTPATIENT_CLINIC_OR_DEPARTMENT_OTHER): Payer: Medicaid Other

## 2016-02-25 ENCOUNTER — Other Ambulatory Visit: Payer: Medicaid Other

## 2016-02-25 ENCOUNTER — Ambulatory Visit (HOSPITAL_BASED_OUTPATIENT_CLINIC_OR_DEPARTMENT_OTHER): Payer: Medicaid Other | Admitting: Hematology and Oncology

## 2016-02-25 ENCOUNTER — Ambulatory Visit: Payer: Medicaid Other

## 2016-02-25 VITALS — BP 160/92 | HR 84 | Temp 99.4°F | Resp 18 | Ht 66.0 in | Wt 161.4 lb

## 2016-02-25 DIAGNOSIS — G893 Neoplasm related pain (acute) (chronic): Secondary | ICD-10-CM

## 2016-02-25 DIAGNOSIS — D61810 Antineoplastic chemotherapy induced pancytopenia: Secondary | ICD-10-CM

## 2016-02-25 DIAGNOSIS — C9001 Multiple myeloma in remission: Secondary | ICD-10-CM | POA: Diagnosis not present

## 2016-02-25 DIAGNOSIS — Z95828 Presence of other vascular implants and grafts: Secondary | ICD-10-CM | POA: Insufficient documentation

## 2016-02-25 DIAGNOSIS — Z5112 Encounter for antineoplastic immunotherapy: Secondary | ICD-10-CM

## 2016-02-25 DIAGNOSIS — Z5111 Encounter for antineoplastic chemotherapy: Secondary | ICD-10-CM

## 2016-02-25 DIAGNOSIS — Z8619 Personal history of other infectious and parasitic diseases: Secondary | ICD-10-CM

## 2016-02-25 DIAGNOSIS — T451X5A Adverse effect of antineoplastic and immunosuppressive drugs, initial encounter: Secondary | ICD-10-CM

## 2016-02-25 LAB — CBC WITH DIFFERENTIAL/PLATELET
BASO%: 0.3 % (ref 0.0–2.0)
BASOS ABS: 0 10*3/uL (ref 0.0–0.1)
EOS%: 3.8 % (ref 0.0–7.0)
Eosinophils Absolute: 0.2 10*3/uL (ref 0.0–0.5)
HEMATOCRIT: 37.9 % — AB (ref 38.4–49.9)
HEMOGLOBIN: 12.7 g/dL — AB (ref 13.0–17.1)
LYMPH#: 1 10*3/uL (ref 0.9–3.3)
LYMPH%: 19.1 % (ref 14.0–49.0)
MCH: 30.8 pg (ref 27.2–33.4)
MCHC: 33.5 g/dL (ref 32.0–36.0)
MCV: 91.7 fL (ref 79.3–98.0)
MONO#: 0.5 10*3/uL (ref 0.1–0.9)
MONO%: 10.5 % (ref 0.0–14.0)
NEUT%: 66.3 % (ref 39.0–75.0)
NEUTROS ABS: 3.3 10*3/uL (ref 1.5–6.5)
Platelets: 123 10*3/uL — ABNORMAL LOW (ref 140–400)
RBC: 4.14 10*6/uL — ABNORMAL LOW (ref 4.20–5.82)
RDW: 13.9 % (ref 11.0–14.6)
WBC: 5 10*3/uL (ref 4.0–10.3)

## 2016-02-25 LAB — COMPREHENSIVE METABOLIC PANEL
ALBUMIN: 3.6 g/dL (ref 3.5–5.0)
ALT: 17 U/L (ref 0–55)
AST: 15 U/L (ref 5–34)
Alkaline Phosphatase: 68 U/L (ref 40–150)
Anion Gap: 8 mEq/L (ref 3–11)
BUN: 13 mg/dL (ref 7.0–26.0)
CALCIUM: 9.3 mg/dL (ref 8.4–10.4)
CHLORIDE: 109 meq/L (ref 98–109)
CO2: 25 mEq/L (ref 22–29)
Creatinine: 1.1 mg/dL (ref 0.7–1.3)
EGFR: 89 mL/min/{1.73_m2} — AB (ref >90)
GLUCOSE: 101 mg/dL (ref 70–140)
POTASSIUM: 4.1 meq/L (ref 3.5–5.1)
SODIUM: 142 meq/L (ref 136–145)
Total Bilirubin: 0.31 mg/dL (ref 0.20–1.20)
Total Protein: 6.2 g/dL — ABNORMAL LOW (ref 6.4–8.3)

## 2016-02-25 MED ORDER — SODIUM CHLORIDE 0.9 % IJ SOLN
10.0000 mL | INTRAMUSCULAR | Status: DC | PRN
  Administered 2016-02-25: 10 mL via INTRAVENOUS
  Filled 2016-02-25: qty 10

## 2016-02-25 MED ORDER — HEPARIN SOD (PORK) LOCK FLUSH 100 UNIT/ML IV SOLN
500.0000 [IU] | Freq: Once | INTRAVENOUS | Status: AC | PRN
  Administered 2016-02-25: 500 [IU]
  Filled 2016-02-25: qty 5

## 2016-02-25 MED ORDER — SODIUM CHLORIDE 0.9% FLUSH
10.0000 mL | INTRAVENOUS | Status: DC | PRN
Start: 2016-02-25 — End: 2016-02-25
  Administered 2016-02-25: 10 mL
  Filled 2016-02-25: qty 10

## 2016-02-25 MED ORDER — DEXTROSE 5 % IV SOLN
36.0000 mg/m2 | Freq: Once | INTRAVENOUS | Status: AC
  Administered 2016-02-25: 68 mg via INTRAVENOUS
  Filled 2016-02-25: qty 30

## 2016-02-25 MED ORDER — PALONOSETRON HCL INJECTION 0.25 MG/5ML
0.2500 mg | Freq: Once | INTRAVENOUS | Status: AC
  Administered 2016-02-25: 0.25 mg via INTRAVENOUS

## 2016-02-25 MED ORDER — SODIUM CHLORIDE 0.9 % IV SOLN
Freq: Once | INTRAVENOUS | Status: AC
  Administered 2016-02-25: 13:00:00 via INTRAVENOUS

## 2016-02-25 MED ORDER — SODIUM CHLORIDE 0.9 % IV SOLN
300.0000 mg/m2 | Freq: Once | INTRAVENOUS | Status: AC
  Administered 2016-02-25: 560 mg via INTRAVENOUS
  Filled 2016-02-25: qty 28

## 2016-02-25 MED ORDER — PALONOSETRON HCL INJECTION 0.25 MG/5ML
INTRAVENOUS | Status: AC
  Filled 2016-02-25: qty 5

## 2016-02-25 MED ORDER — TRAMADOL HCL 50 MG PO TABS: 50.0000 mg | ORAL_TABLET | Freq: Four times a day (QID) | ORAL | Status: DC | PRN

## 2016-02-25 MED ORDER — DEXAMETHASONE SODIUM PHOSPHATE 100 MG/10ML IJ SOLN
10.0000 mg | Freq: Once | INTRAMUSCULAR | Status: AC
  Administered 2016-02-25: 10 mg via INTRAVENOUS
  Filled 2016-02-25: qty 1

## 2016-02-25 MED FILL — traMADol HCL 50 MG TABS: 50 | 11 days supply | Qty: 90 | Fill #0

## 2016-02-25 NOTE — Patient Instructions (Signed)

## 2016-02-25 NOTE — Progress Notes (Signed)
[place] OFFICE PROGRESS NOTE  Patient Care Team: Boykin Nearing, MD as PCP - General (Family Medicine) Reola Calkins, MD as Consulting Physician (Hematology and Oncology)  SUMMARY OF ONCOLOGIC HISTORY:   Multiple myeloma in remission [facility])   08/28/2015 Bone Marrow Biopsy Accession: [id number] biopsy showed 58% myeloma involvement. Cytogenetics was normal and FISH showed Del 13q and t(11;14).    09/07/2015 - 01/22/2016 Chemotherapy he received 6 cycles of treatment of Velcade and Revlimid with Dex    09/12/2015 - 09/16/2015 Hospital Admission He was admitted to the hospital with sepsis, treatment was placed on hold and he received one dose of GCSF for leukopenia   01/12/2016 Adverse Reaction Delay treatment due to neutropenia   01/27/2016 Imaging ECHO at [facility] showed normal EF   01/27/2016 PET scan PET scan at [facility] showed innumerable lytic lesions throughout   01/27/2016 Procedure PFT is within normal limits at [facility]   01/28/2016 Bone Marrow Biopsy Bone marrow at [facility] showed persistent plasma cell myeloma in a normocellularmarrow (30%) with 20% atypical plasma cells and myeloid hyperplasia.   02/11/2016 -  Chemotherapy He is started on salvage Rx with Kyprolis, Cytoxan and dexamethasone    INTERVAL HISTORY: Please see below for problem oriented charting. He returns prior to week 3 of treatment. He tolerated treatment well. Denies mucositis, nausea or vomiting. No change in bowel habits. Denies worsening neuropathy. He continues to have diffuse chronic bone pain especially in his lower extremities. He is taking tramadol as needed.  REVIEW OF SYSTEMS:   Constitutional: Denies fevers, chills or abnormal weight loss Eyes: Denies blurriness of vision Ears, nose, mouth, throat, and face: Denies mucositis or sore throat Respiratory: Denies cough, dyspnea or wheezes Cardiovascular: Denies palpitation, chest discomfort or lower extremity  swelling Gastrointestinal:  Denies nausea, heartburn or change in bowel habits Skin: Denies abnormal skin rashes Lymphatics: Denies new lymphadenopathy or easy bruising Neurological:Denies numbness, tingling or new weaknesses Behavioral/Psych: Mood is stable, no new changes  All other systems were reviewed with the patient and are negative.  I have reviewed the past medical history, past surgical history, social history and family history with the patient and they are unchanged from previous note.  ALLERGIES:  is allergic to benadryl; trazodone and nefazodone; and latex.  MEDICATIONS:  Current Outpatient Prescriptions  Medication Sig Dispense Refill  . acyclovir (ZOVIRAX) 400 MG tablet Take 1 tablet (400 mg total) by mouth 2 (two) times daily. 60 tablet 3  . aspirin EC 81 MG tablet Take 81 mg by mouth daily.    . bisacodyl (DULCOLAX) 5 MG EC tablet Take 5 mg by mouth 2 (two) times daily as needed for moderate constipation.    Marland Kitchen CALCIUM PO Take 1 tablet by mouth daily.    . cholecalciferol (VITAMIN D) 1000 units tablet Take 2,000 Units by mouth daily.    Marland Kitchen dexamethasone (DECADRON) 4 MG tablet Take 5 pills every week on Thursdays with food (Patient taking differently: Take 5 pills every week on Fridays with food) 40 tablet 4  . diclofenac sodium (VOLTAREN) 1 % GEL Apply 2 g topically 4 (four) times daily. (Patient taking differently: Apply 2 g topically 4 (four) times daily as needed (pain). ) 100 g 2  . gabapentin (NEURONTIN) 300 MG capsule Take 1 capsule (300 mg total) by mouth 3 (three) times daily. 90 capsule 6  . lidocaine-prilocaine (EMLA) cream Apply to affected area once 30 g 3  . lisinopril-hydrochlorothiazide (PRINZIDE,ZESTORETIC) 10-12.5 MG  tablet Take 1 tablet by mouth daily. Reported on 10/06/2015 30 tablet 5  . ondansetron (ZOFRAN) 8 MG tablet Take 1 tablet (8 mg total) by mouth every 8 (eight) hours as needed for nausea. 60 tablet 3  . pantoprazole (PROTONIX) 40 MG tablet Take 1  tablet (40 mg total) by mouth daily. 30 tablet 6  . polyethylene glycol (MIRALAX / GLYCOLAX) packet Take 17 g by mouth 2 (two) times daily. 14 each 0  . prochlorperazine (COMPAZINE) 10 MG tablet Take 1 tablet (10 mg total) by mouth every 6 (six) hours as needed (Nausea or vomiting). 30 tablet 9  . senna-docusate (SENOKOT-S) 8.6-50 MG tablet Take 1 tablet by mouth 2 (two) times daily. 60 tablet 3  . traMADol (ULTRAM) 50 MG tablet Take 1-2 tablets (50-100 mg total) by mouth every 6 (six) hours as needed. for pain 90 tablet 0  . zolpidem (AMBIEN) 10 MG tablet Take 1 tablet (10 mg total) by mouth at bedtime as needed for sleep. 30 tablet 3   No current facility-administered medications for this visit.   Facility-Administered Medications Ordered in Other Visits  Medication Dose Route Frequency Provider Last Rate Last Dose  . 0.9 %  sodium chloride infusion   Intravenous Once Heath Lark, MD      . 0.9 %  sodium chloride infusion   Intravenous Once Heath Lark, MD      . carfilzomib (KYPROLIS) 68 mg in dextrose 5 % 100 mL chemo infusion  36 mg/m2 (Treatment Plan Actual) Intravenous Once Heath Lark, MD      . cyclophosphamide (CYTOXAN) 560 mg in sodium chloride 0.9 % 250 mL chemo infusion  300 mg/m2 (Treatment Plan Actual) Intravenous Once Heath Lark, MD      . dexamethasone (DECADRON) 10 mg in sodium chloride 0.9 % 50 mL IVPB  10 mg Intravenous Once Heath Lark, MD      . heparin lock flush 100 unit/mL  500 Units Intracatheter Once PRN Heath Lark, MD      . palonosetron (ALOXI) injection 0.25 mg  0.25 mg Intravenous Once Heath Lark, MD      . sodium chloride flush (NS) 0.9 % injection 10 mL  10 mL Intracatheter PRN Heath Lark, MD        PHYSICAL EXAMINATION: ECOG PERFORMANCE STATUS: 1 - Symptomatic but completely ambulatory  Filed Vitals:   02/25/16 1152  BP: 160/92  Pulse: 84  Temp: 99.4 F (37.4 C)  Resp: 18   Filed Weights   02/25/16 1152  Weight: 161 lb 6.4 oz (73.211 kg)     GENERAL:alert, no distress and comfortable SKIN: skin color, texture, turgor are normal, no rashes or significant lesions EYES: normal, Conjunctiva are pink and non-injected, sclera clear OROPHARYNX:no exudate, no erythema and lips, buccal mucosa, and tongue normal  NECK: supple, thyroid normal size, non-tender, without nodularity LYMPH:  no palpable lymphadenopathy in the cervical, axillary or inguinal LUNGS: clear to auscultation and percussion with normal breathing effort HEART: regular rate & rhythm and no murmurs and no lower extremity edema ABDOMEN:abdomen soft, non-tender and normal bowel sounds Musculoskeletal:no cyanosis of digits and no clubbing  NEURO: alert & oriented x 3 with fluent speech, no focal motor/sensory deficits  LABORATORY DATA:  I have reviewed the data as listed    Component Value Date/Time   NA 142 02/25/2016 1121   NA 139 12/14/2015 0631   K 4.1 02/25/2016 1121   K 4.4 12/14/2015 0631   CL 106 12/14/2015 0631  CO2 25 02/25/2016 1121   CO2 23 12/14/2015 0631   GLUCOSE 101 02/25/2016 1121   GLUCOSE 111* 12/14/2015 0631   BUN 13.0 02/25/2016 1121   BUN 13 12/14/2015 0631   CREATININE 1.1 02/25/2016 1121   CREATININE 1.12 12/14/2015 0631   CREATININE 1.13 07/24/2015 1155   CALCIUM 9.3 02/25/2016 1121   CALCIUM 9.0 12/14/2015 0631   PROT 6.2* 02/25/2016 1121   PROT 6.2 11/16/2015 0956   PROT 6.1* 09/14/2015 0440   ALBUMIN 3.6 02/25/2016 1121   ALBUMIN 3.5 09/14/2015 0440   AST 15 02/25/2016 1121   AST 33 09/14/2015 0440   ALT 17 02/25/2016 1121   ALT 37 09/14/2015 0440   ALKPHOS 68 02/25/2016 1121   ALKPHOS 47 09/14/2015 0440   BILITOT 0.31 02/25/2016 1121   BILITOT 0.4 09/14/2015 0440   GFRNONAA >60 12/14/2015 0631   GFRNONAA 71 07/24/2015 1155   GFRAA >60 12/14/2015 0631   GFRAA 82 07/24/2015 1155    No results found for: SPEP, UPEP  Lab Results  Component Value Date   WBC 5.0 02/25/2016   NEUTROABS 3.3 02/25/2016   HGB 12.7*  02/25/2016   HCT 37.9* 02/25/2016   MCV 91.7 02/25/2016   PLT 123* 02/25/2016      Chemistry      Component Value Date/Time   NA 142 02/25/2016 1121   NA 139 12/14/2015 0631   K 4.1 02/25/2016 1121   K 4.4 12/14/2015 0631   CL 106 12/14/2015 0631   CO2 25 02/25/2016 1121   CO2 23 12/14/2015 0631   BUN 13.0 02/25/2016 1121   BUN 13 12/14/2015 0631   CREATININE 1.1 02/25/2016 1121   CREATININE 1.12 12/14/2015 0631   CREATININE 1.13 07/24/2015 1155      Component Value Date/Time   CALCIUM 9.3 02/25/2016 1121   CALCIUM 9.0 12/14/2015 0631   ALKPHOS 68 02/25/2016 1121   ALKPHOS 47 09/14/2015 0440   AST 15 02/25/2016 1121   AST 33 09/14/2015 0440   ALT 17 02/25/2016 1121   ALT 37 09/14/2015 0440   BILITOT 0.31 02/25/2016 1121   BILITOT 0.4 09/14/2015 0440     ASSESSMENT & PLAN:  Multiple myeloma in remission ([place]) He tolerated treatment well without any side effects. I have drawn some repeat light chain studies today and will call the patient with test results. We will continue current treatment. I recommend he takes dexamethasone on Mondays. I will see him on the third week of cycle 2 with repeat blood work the week prior. If he has near complete response with drastic drop of his serum light chain, I will call his transplant physician at [place] to plan on future transplant in the future  Pancytopenia due to antineoplastic chemotherapy [facility]) This is likely due to recent treatment. The patient denies recent history of bleeding such as epistaxis, hematuria or hematochezia. He is asymptomatic from the  pancytopenia. I will observe for now. I will continue the chemotherapy at current dose without dosage adjustment.  If the thrombocytopenia gets progressive worse in the future, I might have to delay his treatment or adjust the chemotherapy dose.    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 He is currently taking  tramadol as needed. We'll continue the same He is on calcium and vitamin D supplement I refill his prescription tramadol. He also takes gabapentin for neuropathic pain      Orders Placed This Encounter  Procedures  .  Kappa/lambda light chains    Standing Status: Future     Number of Occurrences:      Standing Expiration Date: 03/31/2017  . Multiple Myeloma Panel (SPEP&IFE w/QIG)    Standing Status: Future     Number of Occurrences:      Standing Expiration Date: 03/31/2017   All questions were answered. The patient knows to call the clinic with any problems, questions or concerns. No barriers to learning was detected. I spent 20 minutes counseling the patient face to face. The total time spent in the appointment was 30 minutes and more than 50% was on counseling and review of test results     [facility], [place], MD 02/25/2016 1:14 PM

## 2016-02-25 NOTE — Assessment & Plan Note (Signed)
The patient is adamant that he does not want to take narcotic therapy. He is currently taking tramadol as needed. We'll continue the same He is on calcium and vitamin D supplement I refill his prescription tramadol. He also takes gabapentin for neuropathic pain

## 2016-02-25 NOTE — Patient Instructions (Signed)
Carfilzomib injection What is this medicine? CARFILZOMIB (kar FILZ oh mib) targets a specific protein within cancer cells and stops the cancer cells from growing. It is used to treat multiple myeloma. This medicine may be used for other purposes; ask your health care provider or pharmacist if you have questions. What should I tell my health care provider before I take this medicine? They need to know if you have any of these conditions: -heart disease -history of blood clots -irregular heartbeat -kidney disease -liver disease -lung or breathing disease -an unusual or allergic reaction to carfilzomib, or other medicines, foods, dyes, or preservatives -pregnant or trying to get pregnant -breast-feeding How should I use this medicine? This medicine is for injection or infusion into a vein. It is given by a health care professional in a hospital or clinic setting. Talk to your pediatrician regarding the use of this medicine in children. Special care may be needed. Overdosage: If you think you have taken too much of this medicine contact a poison control center or emergency room at once. NOTE: This medicine is only for you. Do not share this medicine with others. What if I miss a dose? It is important not to miss your dose. Call your doctor or health care professional if you are unable to keep an appointment. What may interact with this medicine? Interactions are not expected. Give your health care provider a list of all the medicines, herbs, non-prescription drugs, or dietary supplements you use. Also tell them if you smoke, drink alcohol, or use illegal drugs. Some items may interact with your medicine. This list may not describe all possible interactions. Give your health care provider a list of all the medicines, herbs, non-prescription drugs, or dietary supplements you use. Also tell them if you smoke, drink alcohol, or use illegal drugs. Some items may interact with your medicine. What  should I watch for while using this medicine? Your condition will be monitored carefully while you are receiving this medicine. Report any side effects. Continue your course of treatment even though you feel ill unless your doctor tells you to stop. You may need blood work done while you are taking this medicine. Do not become pregnant while taking this medicine or for at least 30 days after stopping it. Women should inform their doctor if they wish to become pregnant or think they might be pregnant. There is a potential for serious side effects to an unborn child. Men should not father a child while taking this medicine and for 90 days after stopping it. Talk to your health care professional or pharmacist for more information. Do not breast-feed an infant while taking this medicine. Check with your doctor or health care professional if you get an attack of severe diarrhea, nausea and vomiting, or if you sweat a lot. The loss of too much body fluid can make it dangerous for you to take this medicine. You may get dizzy. Do not drive, use machinery, or do anything that needs mental alertness until you know how this medicine affects you. Do not stand or sit up quickly, especially if you are an older patient. This reduces the risk of dizzy or fainting spells. What side effects may I notice from receiving this medicine? Side effects that you should report to your doctor or health care professional as soon as possible: -allergic reactions like skin rash, itching or hives, swelling of the face, lips, or tongue -confusion -dizziness -feeling faint or lightheaded -fever or chills -palpitations -seizures -signs and   symptoms of bleeding such as bloody or black, tarry stools; red or dark-brown urine; spitting up blood or brown material that looks like coffee grounds; red spots on the skin; unusual bruising or bleeding including from the eye, gums, or nose -signs and symptoms of a blood clot such as breathing  problems; changes in vision; chest pain; severe, sudden headache; pain, swelling, warmth in the leg; trouble speaking; sudden numbness or weakness of the face, arm or leg -signs and symptoms of kidney injury like trouble passing urine or change in the amount of urine -signs and symptoms of liver injury like dark yellow or brown urine; general ill feeling or flu-like symptoms; light-colored stools; loss of appetite; nausea; right upper belly pain; unusually weak or tired; yellowing of the eyes or skin Side effects that usually do not require medical attention (report to your doctor or health care professional if they continue or are bothersome): -back pain -cough -diarrhea -headache -muscle cramps -vomiting This list may not describe all possible side effects. Call your doctor for medical advice about side effects. You may report side effects to FDA at 1-800-FDA-1088. Where should I keep my medicine? This drug is given in a hospital or clinic and will not be stored at home. NOTE: This sheet is a summary. It may not cover all possible information. If you have questions about this medicine, talk to your doctor, pharmacist, or health care provider.    2016, Elsevier/Gold Standard. (2015-03-24 16:16:00) Cyclophosphamide injection What is this medicine? CYCLOPHOSPHAMIDE (sye kloe FOSS fa mide) is a chemotherapy drug. It slows the growth of cancer cells. This medicine is used to treat many types of cancer like lymphoma, myeloma, leukemia, breast cancer, and ovarian cancer, to name a few. This medicine may be used for other purposes; ask your health care provider or pharmacist if you have questions. What should I tell my health care provider before I take this medicine? They need to know if you have any of these conditions: -blood disorders -history of other chemotherapy -infection -kidney disease -liver disease -recent or ongoing radiation therapy -tumors in the bone marrow -an unusual or  allergic reaction to cyclophosphamide, other chemotherapy, other medicines, foods, dyes, or preservatives -pregnant or trying to get pregnant -breast-feeding How should I use this medicine? This drug is usually given as an injection into a vein or muscle or by infusion into a vein. It is administered in a hospital or clinic by a specially trained health care professional. Talk to your pediatrician regarding the use of this medicine in children. Special care may be needed. Overdosage: If you think you have taken too much of this medicine contact a poison control center or emergency room at once. NOTE: This medicine is only for you. Do not share this medicine with others. What if I miss a dose? It is important not to miss your dose. Call your doctor or health care professional if you are unable to keep an appointment. What may interact with this medicine? This medicine may interact with the following medications: -amiodarone -amphotericin B -azathioprine -certain antiviral medicines for HIV or AIDS such as protease inhibitors (e.g., indinavir, ritonavir) and zidovudine -certain blood pressure medications such as benazepril, captopril, enalapril, fosinopril, lisinopril, moexipril, monopril, perindopril, quinapril, ramipril, trandolapril -certain cancer medications such as anthracyclines (e.g., daunorubicin, doxorubicin), busulfan, cytarabine, paclitaxel, pentostatin, tamoxifen, trastuzumab -certain diuretics such as chlorothiazide, chlorthalidone, hydrochlorothiazide, indapamide, metolazone -certain medicines that treat or prevent blood clots like warfarin -certain muscle relaxants such as succinylcholine -cyclosporine -etanercept -indomethacin -  medicines to increase blood counts like filgrastim, pegfilgrastim, sargramostim -medicines used as general anesthesia -metronidazole -natalizumab This list may not describe all possible interactions. Give your health care provider a list of all the  medicines, herbs, non-prescription drugs, or dietary supplements you use. Also tell them if you smoke, drink alcohol, or use illegal drugs. Some items may interact with your medicine. What should I watch for while using this medicine? Visit your doctor for checks on your progress. This drug may make you feel generally unwell. This is not uncommon, as chemotherapy can affect healthy cells as well as cancer cells. Report any side effects. Continue your course of treatment even though you feel ill unless your doctor tells you to stop. Drink water or other fluids as directed. Urinate often, even at night. In some cases, you may be given additional medicines to help with side effects. Follow all directions for their use. Call your doctor or health care professional for advice if you get a fever, chills or sore throat, or other symptoms of a cold or flu. Do not treat yourself. This drug decreases your body's ability to fight infections. Try to avoid being around people who are sick. This medicine may increase your risk to bruise or bleed. Call your doctor or health care professional if you notice any unusual bleeding. Be careful brushing and flossing your teeth or using a toothpick because you may get an infection or bleed more easily. If you have any dental work done, tell your dentist you are receiving this medicine. You may get drowsy or dizzy. Do not drive, use machinery, or do anything that needs mental alertness until you know how this medicine affects you. Do not become pregnant while taking this medicine or for 1 year after stopping it. Women should inform their doctor if they wish to become pregnant or think they might be pregnant. Men should not father a child while taking this medicine and for 4 months after stopping it. There is a potential for serious side effects to an unborn child. Talk to your health care professional or pharmacist for more information. Do not breast-feed an infant while taking  this medicine. This medicine may interfere with the ability to have a child. This medicine has caused ovarian failure in some women. This medicine has caused reduced sperm counts in some men. You should talk with your doctor or health care professional if you are concerned about your fertility. If you are going to have surgery, tell your doctor or health care professional that you have taken this medicine. What side effects may I notice from receiving this medicine? Side effects that you should report to your doctor or health care professional as soon as possible: -allergic reactions like skin rash, itching or hives, swelling of the face, lips, or tongue -low blood counts - this medicine may decrease the number of white blood cells, red blood cells and platelets. You may be at increased risk for infections and bleeding. -signs of infection - fever or chills, cough, sore throat, pain or difficulty passing urine -signs of decreased platelets or bleeding - bruising, pinpoint red spots on the skin, black, tarry stools, blood in the urine -signs of decreased red blood cells - unusually weak or tired, fainting spells, lightheadedness -breathing problems -dark urine -dizziness -palpitations -swelling of the ankles, feet, hands -trouble passing urine or change in the amount of urine -weight gain -yellowing of the eyes or skin Side effects that usually do not require medical attention (report to   your doctor or health care professional if they continue or are bothersome): -changes in nail or skin color -hair loss -missed menstrual periods -mouth sores -nausea, vomiting This list may not describe all possible side effects. Call your doctor for medical advice about side effects. You may report side effects to FDA at 1-800-FDA-1088. Where should I keep my medicine? This drug is given in a hospital or clinic and will not be stored at home. NOTE: This sheet is a summary. It may not cover all possible  information. If you have questions about this medicine, talk to your doctor, pharmacist, or health care provider.    2016, Elsevier/[employer] Standard. (2012-06-15 16:22:58)

## 2016-02-25 NOTE — Assessment & Plan Note (Signed)
This is likely due to recent treatment. The patient denies recent history of bleeding such as epistaxis, hematuria or hematochezia. He is asymptomatic from the  pancytopenia. I will observe for now. I will continue the chemotherapy at current dose without dosage adjustment.  If the thrombocytopenia gets progressive worse in the future, I might have to delay his treatment or adjust the chemotherapy dose.

## 2016-02-25 NOTE — Assessment & Plan Note (Signed)
He tolerated treatment well without any side effects. I have drawn some repeat light chain studies today and will call the patient with test results. We will continue current treatment. I recommend he takes dexamethasone on Mondays. I will see him on the third week of cycle 2 with repeat blood work the week prior. If he has near complete response with drastic drop of his serum light chain, I will call his transplant physician at [place] to plan on future transplant in the future

## 2016-02-26 ENCOUNTER — Telehealth: Payer: Self-pay | Admitting: Hematology and Oncology

## 2016-02-26 ENCOUNTER — Ambulatory Visit (HOSPITAL_BASED_OUTPATIENT_CLINIC_OR_DEPARTMENT_OTHER): Payer: Medicaid Other

## 2016-02-26 VITALS — BP 153/108 | HR 79 | Temp 99.3°F | Resp 16

## 2016-02-26 DIAGNOSIS — Z5112 Encounter for antineoplastic immunotherapy: Secondary | ICD-10-CM

## 2016-02-26 DIAGNOSIS — C9001 Multiple myeloma in remission: Secondary | ICD-10-CM | POA: Diagnosis not present

## 2016-02-26 LAB — MULTIPLE MYELOMA PANEL, SERUM
ALBUMIN SERPL ELPH-MCNC: 3.4 g/dL (ref 2.9–4.4)
ALPHA 1: 0.2 g/dL (ref 0.0–0.4)
ALPHA2 GLOB SERPL ELPH-MCNC: 0.7 g/dL (ref 0.4–1.0)
Albumin/Glob SerPl: 1.5 (ref 0.7–1.7)
B-Globulin SerPl Elph-Mcnc: 0.8 g/dL (ref 0.7–1.3)
Gamma Glob SerPl Elph-Mcnc: 0.6 g/dL (ref 0.4–1.8)
Globulin, Total: 2.3 g/dL (ref 2.2–3.9)
IGA/IMMUNOGLOBULIN A, SERUM: 23 mg/dL — AB (ref 90–386)
IGG (IMMUNOGLOBIN G), SERUM: 565 mg/dL — AB (ref 700–1600)
TOTAL PROTEIN: 5.7 g/dL — AB (ref 6.0–8.5)

## 2016-02-26 LAB — KAPPA/LAMBDA LIGHT CHAINS
Ig Kappa Free Light Chain: 5.5 mg/L (ref 3.3–19.4)
Ig Lambda Free Light Chain: 322.3 mg/L — ABNORMAL HIGH (ref 5.7–26.3)
KAPPA/LAMBDA FLC RATIO: 0.02 — AB (ref 0.26–1.65)

## 2016-02-26 MED ORDER — SODIUM CHLORIDE 0.9 % IV SOLN: Freq: Once | INTRAVENOUS | Status: DC

## 2016-02-26 MED ORDER — HEPARIN SOD (PORK) LOCK FLUSH 100 UNIT/ML IV SOLN
500.0000 [IU] | Freq: Once | INTRAVENOUS | Status: AC | PRN
  Administered 2016-02-26: 500 [IU]
  Filled 2016-02-26: qty 5

## 2016-02-26 MED ORDER — DEXTROSE 5 % IV SOLN
36.0000 mg/m2 | Freq: Once | INTRAVENOUS | Status: AC
  Administered 2016-02-26: 68 mg via INTRAVENOUS
  Filled 2016-02-26: qty 34

## 2016-02-26 MED ORDER — SODIUM CHLORIDE 0.9% FLUSH
10.0000 mL | INTRAVENOUS | Status: DC | PRN
  Administered 2016-02-26: 10 mL
  Filled 2016-02-26: qty 10

## 2016-02-26 MED ORDER — SODIUM CHLORIDE 0.9 % IV SOLN
10.0000 mg | Freq: Once | INTRAVENOUS | Status: AC
  Administered 2016-02-26: 10 mg via INTRAVENOUS
  Filled 2016-02-26: qty 1

## 2016-02-26 MED ORDER — SODIUM CHLORIDE 0.9 % IV SOLN
Freq: Once | INTRAVENOUS | Status: AC
  Administered 2016-02-26: 13:00:00 via INTRAVENOUS

## 2016-02-26 NOTE — Telephone Encounter (Signed)
per pof to sch pt appt-gave pt copy of avs °

## 2016-03-03 ENCOUNTER — Encounter: Payer: Self-pay | Admitting: *Deleted

## 2016-03-03 LAB — RPR QUALITATIVE
RPR: NONREACTIVE
RPR: NONREACTIVE

## 2016-03-03 NOTE — Progress Notes (Signed)
Faxed RPR result (non reactive) to Health Dept at fax [phone]

## 2016-03-10 ENCOUNTER — Other Ambulatory Visit (HOSPITAL_BASED_OUTPATIENT_CLINIC_OR_DEPARTMENT_OTHER): Payer: Medicaid Other

## 2016-03-10 ENCOUNTER — Ambulatory Visit (HOSPITAL_BASED_OUTPATIENT_CLINIC_OR_DEPARTMENT_OTHER): Payer: Medicaid Other

## 2016-03-10 ENCOUNTER — Ambulatory Visit: Payer: Medicaid Other

## 2016-03-10 VITALS — BP 130/89 | HR 77 | Temp 98.2°F | Resp 18

## 2016-03-10 DIAGNOSIS — Z5111 Encounter for antineoplastic chemotherapy: Secondary | ICD-10-CM

## 2016-03-10 DIAGNOSIS — C9001 Multiple myeloma in remission: Secondary | ICD-10-CM

## 2016-03-10 DIAGNOSIS — Z5112 Encounter for antineoplastic immunotherapy: Secondary | ICD-10-CM | POA: Diagnosis present

## 2016-03-10 DIAGNOSIS — Z95828 Presence of other vascular implants and grafts: Secondary | ICD-10-CM

## 2016-03-10 LAB — CBC WITH DIFFERENTIAL/PLATELET
BASO%: 0.3 % (ref 0.0–2.0)
BASOS ABS: 0 10*3/uL (ref 0.0–0.1)
EOS ABS: 0.1 10*3/uL (ref 0.0–0.5)
EOS%: 0.8 % (ref 0.0–7.0)
HCT: 38.1 % — ABNORMAL LOW (ref 38.4–49.9)
HGB: 13.1 g/dL (ref 13.0–17.1)
LYMPH%: 28 % (ref 14.0–49.0)
MCH: 31.3 pg (ref 27.2–33.4)
MCHC: 34.4 g/dL (ref 32.0–36.0)
MCV: 90.9 fL (ref 79.3–98.0)
MONO#: 0.6 10*3/uL (ref 0.1–0.9)
MONO%: 8.6 % (ref 0.0–14.0)
NEUT%: 62.3 % (ref 39.0–75.0)
NEUTROS ABS: 4.6 10*3/uL (ref 1.5–6.5)
PLATELETS: 294 10*3/uL (ref 140–400)
RBC: 4.19 10*6/uL — AB (ref 4.20–5.82)
RDW: 14.3 % (ref 11.0–14.6)
WBC: 7.4 10*3/uL (ref 4.0–10.3)
lymph#: 2.1 10*3/uL (ref 0.9–3.3)

## 2016-03-10 LAB — COMPREHENSIVE METABOLIC PANEL
ALK PHOS: 57 U/L (ref 40–150)
ALT: 22 U/L (ref 0–55)
ANION GAP: 10 meq/L (ref 3–11)
AST: 16 U/L (ref 5–34)
Albumin: 3.9 g/dL (ref 3.5–5.0)
BILIRUBIN TOTAL: 0.64 mg/dL (ref 0.20–1.20)
BUN: 16.8 mg/dL (ref 7.0–26.0)
CO2: 27 meq/L (ref 22–29)
Calcium: 9.4 mg/dL (ref 8.4–10.4)
Chloride: 105 mEq/L (ref 98–109)
Creatinine: 1.1 mg/dL (ref 0.7–1.3)
EGFR: 82 mL/min/{1.73_m2} — AB (ref >90)
Glucose: 77 mg/dl (ref 70–140)
Potassium: 3.6 mEq/L (ref 3.5–5.1)
Sodium: 142 mEq/L (ref 136–145)
TOTAL PROTEIN: 6.7 g/dL (ref 6.4–8.3)

## 2016-03-10 MED ORDER — SODIUM CHLORIDE 0.9 % IJ SOLN
10.0000 mL | INTRAMUSCULAR | Status: DC | PRN
  Administered 2016-03-10: 10 mL via INTRAVENOUS
  Filled 2016-03-10: qty 10

## 2016-03-10 MED ORDER — SODIUM CHLORIDE 0.9 % IV SOLN
Freq: Once | INTRAVENOUS | Status: AC
  Administered 2016-03-10: 12:00:00 via INTRAVENOUS

## 2016-03-10 MED ORDER — PALONOSETRON HCL INJECTION 0.25 MG/5ML
INTRAVENOUS | Status: AC
  Filled 2016-03-10: qty 5

## 2016-03-10 MED ORDER — HEPARIN SOD (PORK) LOCK FLUSH 100 UNIT/ML IV SOLN
500.0000 [IU] | Freq: Once | INTRAVENOUS | Status: AC | PRN
  Administered 2016-03-10: 500 [IU]
  Filled 2016-03-10: qty 5

## 2016-03-10 MED ORDER — SODIUM CHLORIDE 0.9 % IV SOLN: Freq: Once | INTRAVENOUS | Status: DC

## 2016-03-10 MED ORDER — PALONOSETRON HCL INJECTION 0.25 MG/5ML
0.2500 mg | Freq: Once | INTRAVENOUS | Status: AC
  Administered 2016-03-10: 0.25 mg via INTRAVENOUS

## 2016-03-10 MED ORDER — DEXTROSE 5 % IV SOLN
36.0000 mg/m2 | Freq: Once | INTRAVENOUS | Status: AC
  Administered 2016-03-10: 68 mg via INTRAVENOUS
  Filled 2016-03-10: qty 30

## 2016-03-10 MED ORDER — SODIUM CHLORIDE 0.9 % IV SOLN
10.0000 mg | Freq: Once | INTRAVENOUS | Status: AC
  Administered 2016-03-10: 10 mg via INTRAVENOUS
  Filled 2016-03-10: qty 1

## 2016-03-10 MED ORDER — CYCLOPHOSPHAMIDE CHEMO INJECTION 1 GM
300.0000 mg/m2 | Freq: Once | INTRAMUSCULAR | Status: AC
  Administered 2016-03-10: 560 mg via INTRAVENOUS
  Filled 2016-03-10: qty 28

## 2016-03-10 MED ORDER — SODIUM CHLORIDE 0.9% FLUSH
10.0000 mL | INTRAVENOUS | Status: DC | PRN
Start: 2016-03-10 — End: 2016-03-10
  Administered 2016-03-10: 10 mL
  Filled 2016-03-10: qty 10

## 2016-03-10 NOTE — Patient Instructions (Signed)
[place] Discharge Instructions for Patients Receiving Chemotherapy  Today you received the following chemotherapy agents Kyprolis and Cytoxan  To help prevent nausea and vomiting after your treatment, we encourage you to take your nausea medication as needed   If you develop nausea and vomiting that is not controlled by your nausea medication, call the clinic.   BELOW ARE SYMPTOMS THAT SHOULD BE REPORTED IMMEDIATELY:  *FEVER GREATER THAN 100.5 F  *CHILLS WITH OR WITHOUT FEVER  NAUSEA AND VOMITING THAT IS NOT CONTROLLED WITH YOUR NAUSEA MEDICATION  *UNUSUAL SHORTNESS OF BREATH  *UNUSUAL BRUISING OR BLEEDING  TENDERNESS IN MOUTH AND THROAT WITH OR WITHOUT PRESENCE OF ULCERS  *URINARY PROBLEMS  *BOWEL PROBLEMS  UNUSUAL RASH Items with * indicate a potential emergency and should be followed up as soon as possible.  Feel free to call the clinic you have any questions or concerns. The clinic phone number is [phone].  Please show the [place] at check-in to the Emergency Department and triage nurse.

## 2016-03-11 ENCOUNTER — Ambulatory Visit (HOSPITAL_BASED_OUTPATIENT_CLINIC_OR_DEPARTMENT_OTHER): Payer: Medicaid Other

## 2016-03-11 VITALS — BP 115/86 | HR 92 | Temp 99.2°F | Resp 18

## 2016-03-11 DIAGNOSIS — C9001 Multiple myeloma in remission: Secondary | ICD-10-CM | POA: Diagnosis not present

## 2016-03-11 DIAGNOSIS — Z5112 Encounter for antineoplastic immunotherapy: Secondary | ICD-10-CM

## 2016-03-11 MED ORDER — SODIUM CHLORIDE 0.9 % IV SOLN
10.0000 mg | Freq: Once | INTRAVENOUS | Status: AC
  Administered 2016-03-11: 10 mg via INTRAVENOUS
  Filled 2016-03-11: qty 1

## 2016-03-11 MED ORDER — HEPARIN SOD (PORK) LOCK FLUSH 100 UNIT/ML IV SOLN
500.0000 [IU] | Freq: Once | INTRAVENOUS | Status: AC | PRN
  Administered 2016-03-11: 500 [IU]
  Filled 2016-03-11: qty 5

## 2016-03-11 MED ORDER — SODIUM CHLORIDE 0.9 % IV SOLN
Freq: Once | INTRAVENOUS | Status: AC
  Administered 2016-03-11: 16:00:00 via INTRAVENOUS

## 2016-03-11 MED ORDER — SODIUM CHLORIDE 0.9% FLUSH
10.0000 mL | INTRAVENOUS | Status: DC | PRN
  Administered 2016-03-11: 10 mL
  Filled 2016-03-11: qty 10

## 2016-03-11 MED ORDER — DEXTROSE 5 % IV SOLN
36.0000 mg/m2 | Freq: Once | INTRAVENOUS | Status: AC
  Administered 2016-03-11: 68 mg via INTRAVENOUS
  Filled 2016-03-11: qty 30

## 2016-03-11 MED ORDER — SODIUM CHLORIDE 0.9 % IV SOLN
Freq: Once | INTRAVENOUS | Status: AC
  Administered 2016-03-11: 14:00:00 via INTRAVENOUS

## 2016-03-17 ENCOUNTER — Ambulatory Visit: Payer: Medicaid Other

## 2016-03-17 ENCOUNTER — Other Ambulatory Visit: Payer: Self-pay | Admitting: *Deleted

## 2016-03-17 ENCOUNTER — Other Ambulatory Visit: Payer: Medicaid Other

## 2016-03-17 ENCOUNTER — Ambulatory Visit (HOSPITAL_BASED_OUTPATIENT_CLINIC_OR_DEPARTMENT_OTHER): Payer: Medicaid Other

## 2016-03-17 ENCOUNTER — Other Ambulatory Visit (HOSPITAL_BASED_OUTPATIENT_CLINIC_OR_DEPARTMENT_OTHER): Payer: Medicaid Other

## 2016-03-17 VITALS — BP 144/91 | HR 87 | Temp 97.5°F | Resp 18

## 2016-03-17 DIAGNOSIS — C9001 Multiple myeloma in remission: Secondary | ICD-10-CM

## 2016-03-17 DIAGNOSIS — Z95828 Presence of other vascular implants and grafts: Secondary | ICD-10-CM

## 2016-03-17 DIAGNOSIS — Z5112 Encounter for antineoplastic immunotherapy: Secondary | ICD-10-CM

## 2016-03-17 DIAGNOSIS — Z5111 Encounter for antineoplastic chemotherapy: Secondary | ICD-10-CM

## 2016-03-17 LAB — CBC WITH DIFFERENTIAL/PLATELET
BASO%: 0.2 % (ref 0.0–2.0)
Basophils Absolute: 0 10*3/uL (ref 0.0–0.1)
EOS%: 1.7 % (ref 0.0–7.0)
Eosinophils Absolute: 0.1 10*3/uL (ref 0.0–0.5)
HEMATOCRIT: 36.7 % — AB (ref 38.4–49.9)
HEMOGLOBIN: 12.9 g/dL — AB (ref 13.0–17.1)
LYMPH#: 1.5 10*3/uL (ref 0.9–3.3)
LYMPH%: 25.6 % (ref 14.0–49.0)
MCH: 31.7 pg (ref 27.2–33.4)
MCHC: 35.1 g/dL (ref 32.0–36.0)
MCV: 90.2 fL (ref 79.3–98.0)
MONO#: 0.7 10*3/uL (ref 0.1–0.9)
MONO%: 12.3 % (ref 0.0–14.0)
NEUT%: 60.2 % (ref 39.0–75.0)
NEUTROS ABS: 3.5 10*3/uL (ref 1.5–6.5)
PLATELETS: 133 10*3/uL — AB (ref 140–400)
RBC: 4.07 10*6/uL — ABNORMAL LOW (ref 4.20–5.82)
RDW: 14.1 % (ref 11.0–14.6)
WBC: 5.8 10*3/uL (ref 4.0–10.3)

## 2016-03-17 LAB — COMPREHENSIVE METABOLIC PANEL
ALT: 47 U/L (ref 0–55)
AST: 22 U/L (ref 5–34)
Albumin: 3.7 g/dL (ref 3.5–5.0)
Alkaline Phosphatase: 61 U/L (ref 40–150)
Anion Gap: 11 mEq/L (ref 3–11)
BUN: 20.1 mg/dL (ref 7.0–26.0)
CALCIUM: 9.5 mg/dL (ref 8.4–10.4)
CHLORIDE: 111 meq/L — AB (ref 98–109)
CO2: 23 mEq/L (ref 22–29)
Creatinine: 1 mg/dL (ref 0.7–1.3)
EGFR: 90 mL/min/{1.73_m2} — ABNORMAL LOW (ref >90)
Glucose: 140 mg/dl (ref 70–140)
POTASSIUM: 3.8 meq/L (ref 3.5–5.1)
Sodium: 145 mEq/L (ref 136–145)
TOTAL PROTEIN: 6.5 g/dL (ref 6.4–8.3)
Total Bilirubin: 0.49 mg/dL (ref 0.20–1.20)

## 2016-03-17 MED ORDER — HEPARIN SOD (PORK) LOCK FLUSH 100 UNIT/ML IV SOLN
500.0000 [IU] | Freq: Once | INTRAVENOUS | Status: AC | PRN
  Administered 2016-03-17: 500 [IU]
  Filled 2016-03-17: qty 5

## 2016-03-17 MED ORDER — TRAMADOL HCL 50 MG PO TABS: 50.0000 mg | ORAL_TABLET | Freq: Four times a day (QID) | ORAL | 0 refills | Status: DC | PRN

## 2016-03-17 MED ORDER — SODIUM CHLORIDE 0.9 % IV SOLN
10.0000 mg | Freq: Once | INTRAVENOUS | Status: AC
  Administered 2016-03-17: 10 mg via INTRAVENOUS
  Filled 2016-03-17: qty 1

## 2016-03-17 MED ORDER — DEXTROSE 5 % IV SOLN
36.0000 mg/m2 | Freq: Once | INTRAVENOUS | Status: AC
  Administered 2016-03-17: 68 mg via INTRAVENOUS
  Filled 2016-03-17: qty 34

## 2016-03-17 MED ORDER — SODIUM CHLORIDE 0.9 % IV SOLN
Freq: Once | INTRAVENOUS | Status: AC
  Administered 2016-03-17: 14:00:00 via INTRAVENOUS

## 2016-03-17 MED ORDER — PALONOSETRON HCL INJECTION 0.25 MG/5ML
INTRAVENOUS | Status: AC
  Filled 2016-03-17: qty 5

## 2016-03-17 MED ORDER — SODIUM CHLORIDE 0.9 % IJ SOLN
10.0000 mL | INTRAMUSCULAR | Status: DC | PRN
Start: 2016-03-17 — End: 2016-03-17
  Administered 2016-03-17: 10 mL via INTRAVENOUS
  Filled 2016-03-17: qty 10

## 2016-03-17 MED ORDER — PALONOSETRON HCL INJECTION 0.25 MG/5ML
0.2500 mg | Freq: Once | INTRAVENOUS | Status: AC
  Administered 2016-03-17: 0.25 mg via INTRAVENOUS

## 2016-03-17 MED ORDER — SODIUM CHLORIDE 0.9 % IV SOLN
300.0000 mg/m2 | Freq: Once | INTRAVENOUS | Status: AC
  Administered 2016-03-17: 560 mg via INTRAVENOUS
  Filled 2016-03-17: qty 28

## 2016-03-17 MED ORDER — SODIUM CHLORIDE 0.9% FLUSH
10.0000 mL | INTRAVENOUS | Status: DC | PRN
Start: 2016-03-17 — End: 2016-03-17
  Administered 2016-03-17: 10 mL
  Filled 2016-03-17: qty 10

## 2016-03-17 MED FILL — ACYCLOVIR 400 MG TABLET: 400 | 30 days supply | Qty: 60 | Fill #2

## 2016-03-17 MED FILL — GABAPENTIN 300 MG CAPSULE: 300 | 30 days supply | Qty: 90 | Fill #1

## 2016-03-17 MED FILL — traMADol HCL 50 MG TABS: 50 | 11 days supply | Qty: 90 | Fill #0

## 2016-03-17 MED FILL — ZOLPIDEM TARTRATE 10 MG TAB: 10 | 30 days supply | Qty: 30 | Fill #1

## 2016-03-17 NOTE — Patient Instructions (Signed)
[place] Discharge Instructions for Patients Receiving Chemotherapy  Today you received the following chemotherapy agents Kyprolis and Cytoxan  To help prevent nausea and vomiting after your treatment, we encourage you to take your nausea medication as needed   If you develop nausea and vomiting that is not controlled by your nausea medication, call the clinic.   BELOW ARE SYMPTOMS THAT SHOULD BE REPORTED IMMEDIATELY:  *FEVER GREATER THAN 100.5 F  *CHILLS WITH OR WITHOUT FEVER  NAUSEA AND VOMITING THAT IS NOT CONTROLLED WITH YOUR NAUSEA MEDICATION  *UNUSUAL SHORTNESS OF BREATH  *UNUSUAL BRUISING OR BLEEDING  TENDERNESS IN MOUTH AND THROAT WITH OR WITHOUT PRESENCE OF ULCERS  *URINARY PROBLEMS  *BOWEL PROBLEMS  UNUSUAL RASH Items with * indicate a potential emergency and should be followed up as soon as possible.  Feel free to call the clinic you have any questions or concerns. The clinic phone number is [phone].  Please show the [place] at check-in to the Emergency Department and triage nurse.

## 2016-03-18 ENCOUNTER — Ambulatory Visit (HOSPITAL_BASED_OUTPATIENT_CLINIC_OR_DEPARTMENT_OTHER): Payer: Medicaid Other

## 2016-03-18 VITALS — BP 125/80 | HR 95 | Temp 98.8°F | Resp 17

## 2016-03-18 DIAGNOSIS — Z5112 Encounter for antineoplastic immunotherapy: Secondary | ICD-10-CM | POA: Diagnosis present

## 2016-03-18 DIAGNOSIS — C9001 Multiple myeloma in remission: Secondary | ICD-10-CM | POA: Diagnosis not present

## 2016-03-18 LAB — KAPPA/LAMBDA LIGHT CHAINS
IG LAMBDA FREE LIGHT CHAIN: 267.4 mg/L — AB (ref 5.7–26.3)
Ig Kappa Free Light Chain: 4.4 mg/L (ref 3.3–19.4)
Kappa/Lambda FluidC Ratio: 0.02 — ABNORMAL LOW (ref 0.26–1.65)

## 2016-03-18 MED ORDER — SODIUM CHLORIDE 0.9 % IV SOLN
10.0000 mg | Freq: Once | INTRAVENOUS | Status: AC
  Administered 2016-03-18: 10 mg via INTRAVENOUS
  Filled 2016-03-18: qty 1

## 2016-03-18 MED ORDER — SODIUM CHLORIDE 0.9 % IV SOLN
Freq: Once | INTRAVENOUS | Status: AC
  Administered 2016-03-18: 13:00:00 via INTRAVENOUS

## 2016-03-18 MED ORDER — DEXTROSE 5 % IV SOLN
36.0000 mg/m2 | Freq: Once | INTRAVENOUS | Status: AC
  Administered 2016-03-18: 68 mg via INTRAVENOUS
  Filled 2016-03-18: qty 30

## 2016-03-18 MED ORDER — SODIUM CHLORIDE 0.9% FLUSH
10.0000 mL | INTRAVENOUS | Status: DC | PRN
  Administered 2016-03-18: 10 mL
  Filled 2016-03-18: qty 10

## 2016-03-18 MED ORDER — HEPARIN SOD (PORK) LOCK FLUSH 100 UNIT/ML IV SOLN
500.0000 [IU] | Freq: Once | INTRAVENOUS | Status: AC | PRN
  Administered 2016-03-18: 500 [IU]
  Filled 2016-03-18: qty 5

## 2016-03-18 NOTE — Patient Instructions (Signed)
[facility] Discharge Instructions for Patients Receiving Chemotherapy  Today you received the following chemotherapy agents: Kyprolis. To help prevent nausea and vomiting after your treatment, we encourage you to take your nausea medication:  Compazine 10 mg every 6 hours as needed.   If you develop nausea and vomiting that is not controlled by your nausea medication, call the clinic.   BELOW ARE SYMPTOMS THAT SHOULD BE REPORTED IMMEDIATELY:  *FEVER GREATER THAN 100.5 F  *CHILLS WITH OR WITHOUT FEVER  NAUSEA AND VOMITING THAT IS NOT CONTROLLED WITH YOUR NAUSEA MEDICATION  *UNUSUAL SHORTNESS OF BREATH  *UNUSUAL BRUISING OR BLEEDING  TENDERNESS IN MOUTH AND THROAT WITH OR WITHOUT PRESENCE OF ULCERS  *URINARY PROBLEMS  *BOWEL PROBLEMS  UNUSUAL RASH Items with * indicate a potential emergency and should be followed up as soon as possible.  Feel free to call the clinic you have any questions or concerns. The clinic phone number is [phone].  Please show the CHEMO ALERT CARD at check-in to the Emergency Department and triage nurse.   

## 2016-03-21 LAB — MULTIPLE MYELOMA PANEL, SERUM
ALBUMIN SERPL ELPH-MCNC: 3.9 g/dL (ref 2.9–4.4)
ALBUMIN/GLOB SERPL: 1.9 — AB (ref 0.7–1.7)
ALPHA 1: 0.2 g/dL (ref 0.0–0.4)
ALPHA2 GLOB SERPL ELPH-MCNC: 0.8 g/dL (ref 0.4–1.0)
B-Globulin SerPl Elph-Mcnc: 0.8 g/dL (ref 0.7–1.3)
GAMMA GLOB SERPL ELPH-MCNC: 0.3 g/dL — AB (ref 0.4–1.8)
GLOBULIN, TOTAL: 2.1 g/dL — AB (ref 2.2–3.9)
IGA/IMMUNOGLOBULIN A, SERUM: 25 mg/dL — AB (ref 90–386)
IGG (IMMUNOGLOBIN G), SERUM: 567 mg/dL — AB (ref 700–1600)
IgM, Qn, Serum: <5 mg/dL — ABNORMAL LOW (ref 20–172)
Total Protein: 6 g/dL (ref 6.0–8.5)

## 2016-03-21 MED FILL — LISINOPRIL-HCTZ 10-12.5 MG: 10-12.5 | 30 days supply | Qty: 30 | Fill #4

## 2016-03-24 ENCOUNTER — Ambulatory Visit (HOSPITAL_BASED_OUTPATIENT_CLINIC_OR_DEPARTMENT_OTHER): Payer: Medicaid Other

## 2016-03-24 ENCOUNTER — Ambulatory Visit (HOSPITAL_COMMUNITY)
Admission: RE | Admit: 2016-03-24 | Discharge: 2016-03-24 | Disposition: A | Discharge status: Home / Self Care | Payer: Medicaid Other | Source: Ambulatory Visit | Attending: Hematology and Oncology | Admitting: Hematology and Oncology

## 2016-03-24 ENCOUNTER — Other Ambulatory Visit (HOSPITAL_BASED_OUTPATIENT_CLINIC_OR_DEPARTMENT_OTHER): Payer: Medicaid Other

## 2016-03-24 ENCOUNTER — Telehealth: Payer: Self-pay | Admitting: Hematology and Oncology

## 2016-03-24 ENCOUNTER — Ambulatory Visit (HOSPITAL_BASED_OUTPATIENT_CLINIC_OR_DEPARTMENT_OTHER): Payer: Medicaid Other | Admitting: Hematology and Oncology

## 2016-03-24 ENCOUNTER — Ambulatory Visit: Payer: Medicaid Other

## 2016-03-24 VITALS — BP 148/109 | HR 99 | Temp 98.1°F | Resp 18 | Wt 163.5 lb

## 2016-03-24 DIAGNOSIS — R0609 Other forms of dyspnea: Secondary | ICD-10-CM | POA: Diagnosis not present

## 2016-03-24 DIAGNOSIS — C9001 Multiple myeloma in remission: Secondary | ICD-10-CM | POA: Diagnosis present

## 2016-03-24 DIAGNOSIS — Z95828 Presence of other vascular implants and grafts: Secondary | ICD-10-CM

## 2016-03-24 DIAGNOSIS — K209 Esophagitis, unspecified without bleeding: Secondary | ICD-10-CM

## 2016-03-24 DIAGNOSIS — R803 Bence Jones proteinuria: Secondary | ICD-10-CM

## 2016-03-24 DIAGNOSIS — R0600 Dyspnea, unspecified: Secondary | ICD-10-CM

## 2016-03-24 DIAGNOSIS — G893 Neoplasm related pain (acute) (chronic): Secondary | ICD-10-CM

## 2016-03-24 DIAGNOSIS — D61810 Antineoplastic chemotherapy induced pancytopenia: Secondary | ICD-10-CM

## 2016-03-24 DIAGNOSIS — M79605 Pain in left leg: Secondary | ICD-10-CM

## 2016-03-24 DIAGNOSIS — R918 Other nonspecific abnormal finding of lung field: Secondary | ICD-10-CM | POA: Insufficient documentation

## 2016-03-24 DIAGNOSIS — Z5112 Encounter for antineoplastic immunotherapy: Secondary | ICD-10-CM | POA: Diagnosis present

## 2016-03-24 DIAGNOSIS — Z5111 Encounter for antineoplastic chemotherapy: Secondary | ICD-10-CM

## 2016-03-24 DIAGNOSIS — T451X5A Adverse effect of antineoplastic and immunosuppressive drugs, initial encounter: Secondary | ICD-10-CM

## 2016-03-24 DIAGNOSIS — R0602 Shortness of breath: Secondary | ICD-10-CM | POA: Diagnosis present

## 2016-03-24 LAB — COMPREHENSIVE METABOLIC PANEL
ALBUMIN: 3.2 g/dL — AB (ref 3.5–5.0)
ALK PHOS: 60 U/L (ref 40–150)
ALT: 36 U/L (ref 0–55)
AST: 22 U/L (ref 5–34)
Anion Gap: 8 mEq/L (ref 3–11)
BILIRUBIN TOTAL: 0.44 mg/dL (ref 0.20–1.20)
BUN: 12.9 mg/dL (ref 7.0–26.0)
CALCIUM: 9.5 mg/dL (ref 8.4–10.4)
CO2: 28 mEq/L (ref 22–29)
Chloride: 108 mEq/L (ref 98–109)
Creatinine: 1 mg/dL (ref 0.7–1.3)
GLUCOSE: 113 mg/dL (ref 70–140)
POTASSIUM: 4.3 meq/L (ref 3.5–5.1)
SODIUM: 144 meq/L (ref 136–145)
TOTAL PROTEIN: 6.2 g/dL — AB (ref 6.4–8.3)

## 2016-03-24 LAB — CBC WITH DIFFERENTIAL/PLATELET
BASO%: 0.2 % (ref 0.0–2.0)
Basophils Absolute: 0 10*3/uL (ref 0.0–0.1)
EOS%: 4.3 % (ref 0.0–7.0)
Eosinophils Absolute: 0.2 10*3/uL (ref 0.0–0.5)
HCT: 35.6 % — ABNORMAL LOW (ref 38.4–49.9)
HEMOGLOBIN: 12.2 g/dL — AB (ref 13.0–17.1)
LYMPH%: 20.5 % (ref 14.0–49.0)
MCH: 31.9 pg (ref 27.2–33.4)
MCHC: 34.3 g/dL (ref 32.0–36.0)
MCV: 93.2 fL (ref 79.3–98.0)
MONO#: 0.6 10*3/uL (ref 0.1–0.9)
MONO%: 12.4 % (ref 0.0–14.0)
NEUT%: 62.6 % (ref 39.0–75.0)
NEUTROS ABS: 3.1 10*3/uL (ref 1.5–6.5)
Platelets: 125 10*3/uL — ABNORMAL LOW (ref 140–400)
RBC: 3.82 10*6/uL — ABNORMAL LOW (ref 4.20–5.82)
RDW: 14.5 % (ref 11.0–14.6)
WBC: 4.9 10*3/uL (ref 4.0–10.3)
lymph#: 1 10*3/uL (ref 0.9–3.3)

## 2016-03-24 MED ORDER — SODIUM CHLORIDE 0.9 % IV SOLN
300.0000 mg/m2 | Freq: Once | INTRAVENOUS | Status: AC
  Administered 2016-03-24: 560 mg via INTRAVENOUS
  Filled 2016-03-24: qty 28

## 2016-03-24 MED ORDER — PALONOSETRON HCL INJECTION 0.25 MG/5ML
0.2500 mg | Freq: Once | INTRAVENOUS | Status: AC
  Administered 2016-03-24: 0.25 mg via INTRAVENOUS

## 2016-03-24 MED ORDER — SODIUM CHLORIDE 0.9 % IV SOLN
Freq: Once | INTRAVENOUS | Status: AC
  Administered 2016-03-24: 13:00:00 via INTRAVENOUS

## 2016-03-24 MED ORDER — SODIUM CHLORIDE 0.9 % IJ SOLN
10.0000 mL | INTRAMUSCULAR | Status: DC | PRN
  Administered 2016-03-24: 10 mL via INTRAVENOUS
  Filled 2016-03-24: qty 10

## 2016-03-24 MED ORDER — DEXTROSE 5 % IV SOLN
36.0000 mg/m2 | Freq: Once | INTRAVENOUS | Status: AC
  Administered 2016-03-24: 68 mg via INTRAVENOUS
  Filled 2016-03-24: qty 19

## 2016-03-24 MED ORDER — HEPARIN SOD (PORK) LOCK FLUSH 100 UNIT/ML IV SOLN
500.0000 [IU] | Freq: Once | INTRAVENOUS | Status: AC | PRN
  Administered 2016-03-24: 500 [IU]
  Filled 2016-03-24: qty 5

## 2016-03-24 MED ORDER — SODIUM CHLORIDE 0.9% FLUSH
10.0000 mL | INTRAVENOUS | Status: DC | PRN
Start: 2016-03-24 — End: 2016-03-24
  Administered 2016-03-24: 10 mL
  Filled 2016-03-24: qty 10

## 2016-03-24 MED ORDER — PALONOSETRON HCL INJECTION 0.25 MG/5ML
INTRAVENOUS | Status: AC
  Filled 2016-03-24: qty 5

## 2016-03-24 MED ORDER — SODIUM CHLORIDE 0.9 % IV SOLN
10.0000 mg | Freq: Once | INTRAVENOUS | Status: AC
  Administered 2016-03-24: 10 mg via INTRAVENOUS
  Filled 2016-03-24: qty 1

## 2016-03-24 MED FILL — PANTOPRAZOLE SOD DR 40 MG T: 40 | 30 days supply | Qty: 30 | Fill #1

## 2016-03-24 NOTE — Telephone Encounter (Signed)
Doppler on schedule for today at 3 pm at [facility].Marland Kitchen Spoke with infusion - infusion will inform patient.

## 2016-03-24 NOTE — Progress Notes (Signed)
[place] OFFICE PROGRESS NOTE  Patient Care Team: Boykin Nearing, MD as PCP - General (Family Medicine) Reola Calkins, MD as Consulting Physician (Hematology and Oncology)  SUMMARY OF ONCOLOGIC HISTORY:   Multiple myeloma in remission [facility])   08/28/2015 Bone Marrow Biopsy    Accession: [id number] biopsy showed 58% myeloma involvement. Cytogenetics was normal and FISH showed Del 13q and t(11;14).      09/07/2015 - 01/22/2016 Chemotherapy    he received 6 cycles of treatment of Velcade and Revlimid with Dex      09/12/2015 - 09/16/2015 Hospital Admission    He was admitted to the hospital with sepsis, treatment was placed on hold and he received one dose of GCSF for leukopenia     01/12/2016 Adverse Reaction    Delay treatment due to neutropenia     01/27/2016 Imaging    ECHO at [facility] showed normal EF     01/27/2016 PET scan    PET scan at [facility] showed innumerable lytic lesions throughout     01/27/2016 Procedure    PFT is within normal limits at [facility]     01/28/2016 Bone Marrow Biopsy    Bone marrow at [facility] showed persistent plasma cell myeloma in a normocellularmarrow (30%) with 20% atypical plasma cells and myeloid hyperplasia.     02/11/2016 -  Chemotherapy    He is started on salvage Rx with Kyprolis, Cytoxan and dexamethasone     03/24/2016 Imaging    US venous Doppler showed no evidence of deep vein or superficial thrombosis involving the right lower extremity and left lower extremity. No evidence of Baker&'s cyst on the right or left.      INTERVAL HISTORY: Please see below for problem oriented charting. He returns for further follow-up at the end of cycle 2 of treatment. He tolerated treatment well except from rare esophagitis and mucositis, resolved with conservative management. He was exercising regularly. He complained of chronic low back pain which is stable on tramadol. Last week, he complained of mild lower extremity  edema and pain behind the calf. He complained of shortness of breath on exertion but denies chest pain or dizziness or syncopal episode Denies nausea or constipation  REVIEW OF SYSTEMS:   Constitutional: Denies fevers, chills or abnormal weight loss Eyes: Denies blurriness of vision Cardiovascular: Denies palpitation, chest discomfort Skin: Denies abnormal skin rashes Lymphatics: Denies new lymphadenopathy or easy bruising Neurological:Denies numbness, tingling or new weaknesses Behavioral/Psych: Mood is stable, no new changes  All other systems were reviewed with the patient and are negative.  I have reviewed the past medical history, past surgical history, social history and family history with the patient and they are unchanged from previous note.  ALLERGIES:  is allergic to benadryl [diphenhydramine]; trazodone and nefazodone; and latex.  MEDICATIONS:  Current Outpatient Prescriptions  Medication Sig Dispense Refill  . acyclovir (ZOVIRAX) 400 MG tablet Take 1 tablet (400 mg total) by mouth 2 (two) times daily. 60 tablet 3  . aspirin EC 81 MG tablet Take 81 mg by mouth daily.    . bisacodyl (DULCOLAX) 5 MG EC tablet Take 5 mg by mouth 2 (two) times daily as needed for moderate constipation.    Marland Kitchen CALCIUM PO Take 1 tablet by mouth daily.    . cholecalciferol (VITAMIN D) 1000 units tablet Take 2,000 Units by mouth daily.    Marland Kitchen dexamethasone (DECADRON) 4 MG tablet Take 5 pills every week on Thursdays with food (Patient  taking differently: Take 5 pills every week on Fridays with food) 40 tablet 4  . diclofenac sodium (VOLTAREN) 1 % GEL Apply 2 g topically 4 (four) times daily. (Patient taking differently: Apply 2 g topically 4 (four) times daily as needed (pain). ) 100 g 2  . gabapentin (NEURONTIN) 300 MG capsule Take 1 capsule (300 mg total) by mouth 3 (three) times daily. 90 capsule 6  . lidocaine-prilocaine (EMLA) cream Apply to affected area once 30 g 3  .  lisinopril-hydrochlorothiazide (PRINZIDE,ZESTORETIC) 10-12.5 MG tablet Take 1 tablet by mouth daily. Reported on 10/06/2015 30 tablet 5  . ondansetron (ZOFRAN) 8 MG tablet Take 1 tablet (8 mg total) by mouth every 8 (eight) hours as needed for nausea. 60 tablet 3  . pantoprazole (PROTONIX) 40 MG tablet Take 1 tablet (40 mg total) by mouth daily. 30 tablet 6  . polyethylene glycol (MIRALAX / GLYCOLAX) packet Take 17 g by mouth 2 (two) times daily. 14 each 0  . prochlorperazine (COMPAZINE) 10 MG tablet Take 1 tablet (10 mg total) by mouth every 6 (six) hours as needed (Nausea or vomiting). 30 tablet 9  . senna-docusate (SENOKOT-S) 8.6-50 MG tablet Take 1 tablet by mouth 2 (two) times daily. 60 tablet 3  . traMADol (ULTRAM) 50 MG tablet Take 1-2 tablets (50-100 mg total) by mouth every 6 (six) hours as needed. for pain 90 tablet 0  . zolpidem (AMBIEN) 10 MG tablet Take 1 tablet (10 mg total) by mouth at bedtime as needed for sleep. 30 tablet 3   No current facility-administered medications for this visit.     PHYSICAL EXAMINATION: ECOG PERFORMANCE STATUS: 1 - Symptomatic but completely ambulatory  Vitals:   03/24/16 1118  BP: (!) 148/109  Pulse: 99  Resp: 18  Temp: 98.1 F (36.7 C)   Filed Weights   03/24/16 1118  Weight: 163 lb 8 oz (74.2 kg)    GENERAL:alert, no distress and comfortable SKIN: skin color, texture, turgor are normal, no rashes or significant lesions EYES: normal, Conjunctiva are pink and non-injected, sclera clear OROPHARYNX:no exudate, no erythema and lips, buccal mucosa, and tongue normal  NECK: supple, thyroid normal size, non-tender, without nodularity LYMPH:  no palpable lymphadenopathy in the cervical, axillary or inguinal LUNGS: clear to auscultation and percussion with normal breathing effort HEART: regular rate & rhythm and no murmurs and no lower extremity edema. He has severe left calf pain ABDOMEN:abdomen soft, non-tender and normal bowel  sounds Musculoskeletal:no cyanosis of digits and no clubbing  NEURO: alert & oriented x 3 with fluent speech, no focal motor/sensory deficits  LABORATORY DATA:  I have reviewed the data as listed    Component Value Date/Time   NA 144 03/24/2016 1044   K 4.3 03/24/2016 1044   CL 106 12/14/2015 0631   CO2 28 03/24/2016 1044   GLUCOSE 113 03/24/2016 1044   BUN 12.9 03/24/2016 1044   CREATININE 1.0 03/24/2016 1044   CALCIUM 9.5 03/24/2016 1044   PROT 6.2 (L) 03/24/2016 1044   ALBUMIN 3.2 (L) 03/24/2016 1044   AST 22 03/24/2016 1044   ALT 36 03/24/2016 1044   ALKPHOS 60 03/24/2016 1044   BILITOT 0.44 03/24/2016 1044   GFRNONAA >60 12/14/2015 0631   GFRNONAA 71 07/24/2015 1155   GFRAA >60 12/14/2015 0631   GFRAA 82 07/24/2015 1155    No results found for: SPEP, UPEP  Lab Results  Component Value Date   WBC 4.9 03/24/2016   NEUTROABS 3.1 03/24/2016   HGB  12.2 (L) 03/24/2016   HCT 35.6 (L) 03/24/2016   MCV 93.2 03/24/2016   PLT 125 (L) 03/24/2016      Chemistry      Component Value Date/Time   NA 144 03/24/2016 1044   K 4.3 03/24/2016 1044   CL 106 12/14/2015 0631   CO2 28 03/24/2016 1044   BUN 12.9 03/24/2016 1044   CREATININE 1.0 03/24/2016 1044      Component Value Date/Time   CALCIUM 9.5 03/24/2016 1044   ALKPHOS 60 03/24/2016 1044   AST 22 03/24/2016 1044   ALT 36 03/24/2016 1044   BILITOT 0.44 03/24/2016 1044       RADIOGRAPHIC STUDIES: I have personally reviewed the radiological images as listed and agreed with the findings in the report. Dg Chest 2 View  Result Date: 03/25/2016 CLINICAL DATA:  Shortness of breath for greater than 1 week. Multiple myeloma. EXAM: CHEST  2 VIEW COMPARISON:  09/13/2015 FINDINGS: A right jugular Port-A-Cath terminates over the mid to lower SVC. The cardiomediastinal silhouette is within normal limits. There is mild streaky opacity in the left lung base, with appearance on the lateral radiograph improved compared to the prior  study. Minimal opacity is present in the right lung base. No edema, pleural effusion, or pneumothorax is identified. No acute osseous abnormality is identified. IMPRESSION: Mild left greater than right basilar opacities favored to reflect atelectasis. Electronically Signed   By: Logan Bores M.D.   On: 03/25/2016 07:35     ASSESSMENT & PLAN:  Multiple myeloma in remission ([place]) I review all test results with the patient. Overall, he is responding to treatment but I do not believe he is ready to proceed with stem cell transplant yet. I will touch base with his transplant physician at [facility] today or next week to review test results and plan of care. In the meantime, he will complete cycle 2 of therapy this week as scheduled  Cancer associated pain The patient is adamant that he does not want to take narcotic therapy. He is currently taking tramadol as needed. We'll continue the same He is on calcium and vitamin D supplement He also takes gabapentin for neuropathic pain    Esophagitis  Recent CT scan show evidence of esophagitis. I recommend he takes Protonix and refilled his medications today  Pancytopenia due to antineoplastic chemotherapy [facility]) This is likely due to recent treatment. The patient denies recent history of bleeding such as epistaxis, hematuria or hematochezia. He is asymptomatic from the  pancytopenia. I will observe for now. I will continue the chemotherapy at current dose without dosage adjustment.  If the thrombocytopenia gets progressive worse in the future, I might have to delay his treatment or adjust the chemotherapy dose.    Left leg pain The patient had bilateral calf pain worse on the left. Examination show minimum swelling.   Dyspnea The patient had shortness of breath on exertion. Chest x-ray showed mild atelectasis. The patient is reassured. His vitals are stable and ultrasound venous Doppler excluded DVT.   Orders Placed  This Encounter  Procedures  . DG Chest 2 View    Standing Status:   Future    Number of Occurrences:   1    Standing Expiration Date:   04/28/2017    Order Specific Question:   Reason for exam:    Answer:   cough, SOB    Order Specific Question:   Preferred imaging location?    Answer:   [facility]  . US Venous Img Lower Bilateral    Standing Status:   Future    Standing Expiration Date:   04/28/2017    Order Specific Question:   Reason for exam:    Answer:   bilateral leg pain, left greater than right, exclude DVt    Order Specific Question:   Preferred imaging location?    Answer:   [place] [facility]    Standing Status:   Future    Standing Expiration Date:   04/28/2017  . Multiple Myeloma Panel (SPEP&IFE w/QIG)    Standing Status:   Future    Standing Expiration Date:   04/28/2017   All questions were answered. The patient knows to call the clinic with any problems, questions or concerns. No barriers to learning was detected. I spent 30 minutes counseling the patient face to face. The total time spent in the appointment was 40 minutes and more than 50% was on counseling and review of test results     [facility], [place], MD 03/25/2016 8:38 AM

## 2016-03-24 NOTE — Progress Notes (Signed)
VASCULAR LAB PRELIMINARY  PRELIMINARY  PRELIMINARY  PRELIMINARY  Bilateral lower extremity venous duplex has been completed.    Bilateral:  No evidence of DVT, superficial thrombosis, or Baker's Cyst.  Called Dr. Alvy Bimler, with the results@3 :50 pm.  Janifer Adie, RVT, RDMS 03/24/2016, 3:58 PM

## 2016-03-24 NOTE — Telephone Encounter (Signed)
Gave patient avs report and appointments for August and September  °

## 2016-03-24 NOTE — Patient Instructions (Signed)
[facility] Discharge Instructions for Patients Receiving Chemotherapy  Today you received the following chemotherapy agents Kyprolis and Cytoxan  To help prevent nausea and vomiting after your treatment, we encourage you to take your nausea medication as needed   If you develop nausea and vomiting that is not controlled by your nausea medication, call the clinic.   BELOW ARE SYMPTOMS THAT SHOULD BE REPORTED IMMEDIATELY:  *FEVER GREATER THAN 100.5 F  *CHILLS WITH OR WITHOUT FEVER  NAUSEA AND VOMITING THAT IS NOT CONTROLLED WITH YOUR NAUSEA MEDICATION  *UNUSUAL SHORTNESS OF BREATH  *UNUSUAL BRUISING OR BLEEDING  TENDERNESS IN MOUTH AND THROAT WITH OR WITHOUT PRESENCE OF ULCERS  *URINARY PROBLEMS  *BOWEL PROBLEMS  UNUSUAL RASH Items with * indicate a potential emergency and should be followed up as soon as possible.  Feel free to call the clinic you have any questions or concerns. The clinic phone number is [phone].  Please show the [facility] at check-in to the Emergency Department and triage nurse.

## 2016-03-24 NOTE — Telephone Encounter (Signed)
Gave patient avs report and appointments for August and September. Patient given referral for walk in cxr after infusion today. Also left message for vascular lab re doppler today after infusion. Patient aware I will call infusion area and speak with his infusion nurse re doppler time.

## 2016-03-25 ENCOUNTER — Ambulatory Visit: Payer: Medicaid Other

## 2016-03-25 ENCOUNTER — Ambulatory Visit (HOSPITAL_BASED_OUTPATIENT_CLINIC_OR_DEPARTMENT_OTHER): Payer: Medicaid Other

## 2016-03-25 ENCOUNTER — Encounter: Payer: Self-pay | Admitting: Hematology and Oncology

## 2016-03-25 VITALS — BP 160/100 | HR 100 | Temp 97.8°F | Resp 18

## 2016-03-25 DIAGNOSIS — R0600 Dyspnea, unspecified: Secondary | ICD-10-CM | POA: Insufficient documentation

## 2016-03-25 DIAGNOSIS — Z5112 Encounter for antineoplastic immunotherapy: Secondary | ICD-10-CM

## 2016-03-25 DIAGNOSIS — C9001 Multiple myeloma in remission: Secondary | ICD-10-CM

## 2016-03-25 MED ORDER — SODIUM CHLORIDE 0.9 % IV SOLN
10.0000 mg | Freq: Once | INTRAVENOUS | Status: AC
  Administered 2016-03-25: 10 mg via INTRAVENOUS
  Filled 2016-03-25: qty 1

## 2016-03-25 MED ORDER — DEXTROSE 5 % IV SOLN
36.0000 mg/m2 | Freq: Once | INTRAVENOUS | Status: AC
  Administered 2016-03-25: 68 mg via INTRAVENOUS
  Filled 2016-03-25: qty 30

## 2016-03-25 MED ORDER — CLONIDINE HCL 0.1 MG PO TABS
0.1000 mg | ORAL_TABLET | Freq: Once | ORAL | Status: AC
  Administered 2016-03-25: 0.1 mg via ORAL

## 2016-03-25 MED ORDER — SODIUM CHLORIDE 0.9 % IV SOLN
Freq: Once | INTRAVENOUS | Status: AC
  Administered 2016-03-25: 13:00:00 via INTRAVENOUS

## 2016-03-25 MED ORDER — AMLODIPINE BESYLATE 10 MG PO TABS: 10.0000 mg | ORAL_TABLET | Freq: Every day | ORAL | 1 refills | Status: DC

## 2016-03-25 MED ORDER — HEPARIN SOD (PORK) LOCK FLUSH 100 UNIT/ML IV SOLN
500.0000 [IU] | Freq: Once | INTRAVENOUS | Status: AC | PRN
  Administered 2016-03-25: 500 [IU]
  Filled 2016-03-25: qty 5

## 2016-03-25 MED ORDER — SODIUM CHLORIDE 0.9% FLUSH
10.0000 mL | INTRAVENOUS | Status: DC | PRN
  Administered 2016-03-25: 10 mL
  Filled 2016-03-25: qty 10

## 2016-03-25 MED ORDER — CLONIDINE HCL 0.1 MG PO TABS
ORAL_TABLET | ORAL | Status: AC
Start: 2016-03-25 — End: 2016-03-25
  Filled 2016-03-25: qty 1

## 2016-03-25 MED FILL — AMLODIPINE BESYLATE 10 MG T: 10 | 30 days supply | Qty: 30 | Fill #0

## 2016-03-25 NOTE — Assessment & Plan Note (Signed)
The patient is adamant that he does not want to take narcotic therapy. He is currently taking tramadol as needed. We'll continue the same He is on calcium and vitamin D supplement He also takes gabapentin for neuropathic pain

## 2016-03-25 NOTE — Assessment & Plan Note (Signed)
The patient had bilateral calf pain worse on the left. Examination show minimum swelling.

## 2016-03-25 NOTE — Assessment & Plan Note (Signed)
The patient had shortness of breath on exertion. Chest x-ray showed mild atelectasis. The patient is reassured. His vitals are stable and ultrasound venous Doppler excluded DVT.

## 2016-03-25 NOTE — Assessment & Plan Note (Signed)
I review all test results with the patient. Overall, he is responding to treatment but I do not believe he is ready to proceed with stem cell transplant yet. I will touch base with his transplant physician at [facility] today or next week to review test results and plan of care. In the meantime, he will complete cycle 2 of therapy this week as scheduled

## 2016-03-25 NOTE — Patient Instructions (Signed)
[facility] Discharge Instructions for Patients Receiving Chemotherapy  Today you received the following chemotherapy agents Kyprolis  To help prevent nausea and vomiting after your treatment, we encourage you to take your nausea medication    If you develop nausea and vomiting that is not controlled by your nausea medication, call the clinic.   BELOW ARE SYMPTOMS THAT SHOULD BE REPORTED IMMEDIATELY:  *FEVER GREATER THAN 100.5 F  *CHILLS WITH OR WITHOUT FEVER  NAUSEA AND VOMITING THAT IS NOT CONTROLLED WITH YOUR NAUSEA MEDICATION  *UNUSUAL SHORTNESS OF BREATH  *UNUSUAL BRUISING OR BLEEDING  TENDERNESS IN MOUTH AND THROAT WITH OR WITHOUT PRESENCE OF ULCERS  *URINARY PROBLEMS  *BOWEL PROBLEMS  UNUSUAL RASH Items with * indicate a potential emergency and should be followed up as soon as possible.  Feel free to call the clinic you have any questions or concerns. The clinic phone number is [phone].  Please show the CHEMO ALERT CARD at check-in to the Emergency Department and triage nurse.   

## 2016-03-25 NOTE — Assessment & Plan Note (Signed)
Recent CT scan show evidence of esophagitis. I recommend he takes Protonix and refilled his medications today

## 2016-03-25 NOTE — Progress Notes (Signed)
Increased BPs today.  Dr. Alvy Bimler is aware. Patient received Clonidine 0.1mg  today in infusion.  Amlodipine 10mg  QHS #30, 1RF, per Dr. Alvy Bimler.  Patient to begin Amlodipine tonight and continue Zestoretic as prescribed.  Rx to [facility].  Patient verbalizes understanding.

## 2016-03-25 NOTE — Assessment & Plan Note (Signed)
This is likely due to recent treatment. The patient denies recent history of bleeding such as epistaxis, hematuria or hematochezia. He is asymptomatic from the  pancytopenia. I will observe for now. I will continue the chemotherapy at current dose without dosage adjustment.  If the thrombocytopenia gets progressive worse in the future, I might have to delay his treatment or adjust the chemotherapy dose.

## 2016-03-28 ENCOUNTER — Encounter: Payer: Self-pay | Admitting: Family Medicine

## 2016-03-28 ENCOUNTER — Ambulatory Visit: Payer: Medicaid Other | Attending: Internal Medicine | Admitting: Physician Assistant

## 2016-03-28 ENCOUNTER — Other Ambulatory Visit: Payer: Self-pay | Admitting: Hematology and Oncology

## 2016-03-28 ENCOUNTER — Telehealth: Payer: Self-pay

## 2016-03-28 ENCOUNTER — Telehealth: Payer: Self-pay | Admitting: *Deleted

## 2016-03-28 VITALS — BP 143/99 | HR 101 | Temp 98.2°F | Wt 161.2 lb

## 2016-03-28 DIAGNOSIS — H11002 Unspecified pterygium of left eye: Secondary | ICD-10-CM | POA: Diagnosis not present

## 2016-03-28 DIAGNOSIS — H269 Unspecified cataract: Secondary | ICD-10-CM

## 2016-03-28 MED ORDER — LISINOPRIL-HYDROCHLOROTHIAZIDE 20-25 MG PO TABS: 1.0000 | ORAL_TABLET | Freq: Every day | ORAL | 3 refills | Status: DC

## 2016-03-28 NOTE — Telephone Encounter (Signed)
Per Provider  Clld pt  - adsvd of change in his blood pressure medication and that it has been electronically sent to our pharmacy. Pt stated he understood and will pick up the new medication on Tuesday, March 28, 2016.

## 2016-03-28 NOTE — Progress Notes (Signed)
Ruhan Borak, is a 60 y.o. male  QMG:[id number]  QBV:[id number]  DOB - 19-Jul-1956  Subjective:  Chief Complaint and HPI: Antwann Preziosi is a 60 y.o. male here today requesting to see an eye doctor.  He is currently being treated for Multiple Myeloma and is stable. He feels his vision has deteriorated over the last several months.  No acute changes.  Feels like he needs glasses.  He used to wear glasses but hasn't had prescription glasses in a long time.    Denies dizziness, CP, HA. Compliant with meds.  Recent oncology notes reviewed.     ROS:   Constitutional:  No f/c, No night sweats, No unexplained weight loss. EENT:  +vision changes, +blurry vision, No hearing changes. No mouth, throat, or ear problems.  Respiratory: No cough, No SOB Cardiac: No CP, no palpitations GI:  No abd pain, No N/V/D. GU: No Urinary s/sx Musculoskeletal: No joint pain Neuro: No headache, no dizziness, no motor weakness.  Skin: No rash Endocrine:  No polydipsia. No polyuria.  Psych: Denies SI/HI  No problems updated.  ALLERGIES: Allergies  Allergen Reactions  . Benadryl [Diphenhydramine] Other (See Comments)    priapism  . Trazodone And Nefazodone     priapism  . Latex Itching and Rash    PAST MEDICAL HISTORY: Past Medical History:  Diagnosis Date  . Encounter for antineoplastic chemotherapy 02/09/2016  . History of syphilis 02/24/2016  . Hypertension Dx 2016  . Multiple myeloma not having achieved remission ([place]) 09/01/2015  . Priapism 2014     MEDICATIONS AT HOME: Prior to Admission medications   Medication Sig Start Date End Date Taking? Authorizing Provider  acyclovir (ZOVIRAX) 400 MG tablet Take 1 tablet (400 mg total) by mouth 2 (two) times daily. 01/04/16   Heath Lark, MD  amLODipine (NORVASC) 10 MG tablet Take 1 tablet (10 mg total) by mouth daily. 03/25/16   Heath Lark, MD  aspirin EC 81 MG tablet Take 81 mg by mouth daily.    Historical Provider, MD  bisacodyl (DULCOLAX) 5 MG  EC tablet Take 5 mg by mouth 2 (two) times daily as needed for moderate constipation.    Historical Provider, MD  CALCIUM PO Take 1 tablet by mouth daily.    Historical Provider, MD  cholecalciferol (VITAMIN D) 1000 units tablet Take 2,000 Units by mouth daily.    Historical Provider, MD  dexamethasone (DECADRON) 4 MG tablet Take 5 pills every week on Thursdays with food Patient taking differently: Take 5 pills every week on Fridays with food 02/09/16   Heath Lark, MD  diclofenac sodium (VOLTAREN) 1 % GEL Apply 2 g topically 4 (four) times daily. Patient taking differently: Apply 2 g topically 4 (four) times daily as needed (pain).  10/26/15   Heath Lark, MD  gabapentin (NEURONTIN) 300 MG capsule Take 1 capsule (300 mg total) by mouth 3 (three) times daily. 02/09/16   Heath Lark, MD  lidocaine-prilocaine (EMLA) cream Apply to affected area once 02/09/16   Heath Lark, MD  lisinopril-hydrochlorothiazide (PRINZIDE,ZESTORETIC) 10-12.5 MG tablet Take 1 tablet by mouth daily. Reported on 10/06/2015 10/06/15   Boykin Nearing, MD  ondansetron (ZOFRAN) 8 MG tablet Take 1 tablet (8 mg total) by mouth every 8 (eight) hours as needed for nausea. 02/09/16   Heath Lark, MD  pantoprazole (PROTONIX) 40 MG tablet Take 1 tablet (40 mg total) by mouth daily. 12/14/15   Heath Lark, MD  polyethylene glycol (MIRALAX / GLYCOLAX) packet Take 17 g by mouth  2 (two) times daily. 09/16/15   Eugenie Filler, MD  prochlorperazine (COMPAZINE) 10 MG tablet Take 1 tablet (10 mg total) by mouth every 6 (six) hours as needed (Nausea or vomiting). 02/09/16   Heath Lark, MD  senna-docusate (SENOKOT-S) 8.6-50 MG tablet Take 1 tablet by mouth 2 (two) times daily. 10/12/15   Heath Lark, MD  traMADol (ULTRAM) 50 MG tablet Take 1-2 tablets (50-100 mg total) by mouth every 6 (six) hours as needed. for pain 03/17/16   Heath Lark, MD  zolpidem (AMBIEN) 10 MG tablet Take 1 tablet (10 mg total) by mouth at bedtime as needed for sleep. 02/18/16 03/19/16  Heath Lark, MD     Objective:  EXAM:   Vitals:   03/28/16 1427  BP: (!) 143/99  Pulse: (!) 101  Temp: 98.2 F (36.8 C)  TempSrc: Oral  Weight: 161 lb 3.2 oz (73.1 kg)    General appearance : A&OX3. NAD. Non-toxic-appearing HEENT: Atraumatic and Normocephalic.  PERRLA. EOM intact.  TM clear B. Mouth-MMM, post pharynx WNL w/o erythema, No PND. Neck: supple, no JVD. No cervical lymphadenopathy. No thyromegaly Chest/Lungs:  Breathing-non-labored, Good air entry bilaterally, breath sounds normal without rales, rhonchi, or wheezing  CVS: S1 S2 regular, no murmurs, gallops, rubs  Abdomen: Bowel sounds present, Non tender and not distended with no gaurding, rigidity or rebound. Extremities: Bilateral Lower Ext shows no edema, both legs are warm to touch with = pulse throughout Neurology:  CN II-XII grossly intact, Non focal.   Psych:  TP linear. J/I WNL. Normal speech. Appropriate eye contact and affect.  Skin:  No Rash  Data Review Lab Results  Component Value Date   HGBA1C 5.80 10/06/2015     Assessment & Plan   1. Cataracts, bilateral  - Ambulatory referral to Ophthalmology  2. Pterygium eye, left  - Ambulatory referral to Ophthalmology  3. Hypertension-uncontrolled Increase dose of Lisinopril/HCT to 20/25-1daily.  Check BP OOO and record- We have discussed target BP range and blood pressure goal. I have advised patient to check BP regularly and to call us back or report to clinic if the numbers are consistently higher than 140/90. We discussed the importance of compliance with medical therapy and DASH diet recommended, consequences of uncontrolled hypertension discussed.    Patient have been counseled extensively about nutrition and exercise  Return in about 4 weeks (around 04/25/2016) for check up with Dr Adrian Blackwater.  The patient was given clear instructions to go to ER or return to medical center if symptoms don't improve, worsen or new problems develop. The patient  verbalized understanding. The patient was told to call to get lab results if they haven't heard anything in the next week.     Freeman Caldron, PA-C [facility] [place], [place]   03/28/2016, 2:45 PM

## 2016-03-28 NOTE — Telephone Encounter (Signed)
New specialty prescriptions to be filled by [facility].  NABP nunber [id number]  Any questions, call [phone]

## 2016-03-29 MED FILL — LISINOPRIL-HCTZ 20-25 MG TA: 20-25 | 90 days supply | Qty: 90 | Fill #0

## 2016-03-29 NOTE — Telephone Encounter (Signed)
Pt called would like nurse to call him back to explain why new rx was sent over. Pt is aware of medication at pharmacy and will transfer from [facility] to [place] f/up

## 2016-03-30 ENCOUNTER — Other Ambulatory Visit: Payer: Self-pay | Admitting: Hematology and Oncology

## 2016-03-30 ENCOUNTER — Telehealth: Payer: Self-pay | Admitting: *Deleted

## 2016-03-30 NOTE — Telephone Encounter (Signed)
LM for wife with message below. To call if has questions

## 2016-03-30 NOTE — Telephone Encounter (Signed)
Patient was called and I explained to him why his BP medication was changed.

## 2016-03-30 NOTE — Telephone Encounter (Signed)
-----   Message from Heath Lark, MD sent at 03/30/2016  1:46 PM EDT ----- Regarding: FW: call rodriguez Please call his wife. Let her know I spoke with Dr. Norma Fredrickson and plan is to proceed with chemo as scheduled and I will review all recommendations with them when I see them next month ----- Message ----- From: Heath Lark, MD Sent: 03/24/2016  11:57 AM To: Heath Lark, MD Subject: call rodriguez

## 2016-04-07 ENCOUNTER — Ambulatory Visit: Payer: Medicaid Other

## 2016-04-07 ENCOUNTER — Other Ambulatory Visit (HOSPITAL_BASED_OUTPATIENT_CLINIC_OR_DEPARTMENT_OTHER): Payer: Medicaid Other

## 2016-04-07 ENCOUNTER — Ambulatory Visit (HOSPITAL_BASED_OUTPATIENT_CLINIC_OR_DEPARTMENT_OTHER): Payer: Medicaid Other

## 2016-04-07 VITALS — BP 128/87 | HR 91 | Temp 98.3°F | Resp 18

## 2016-04-07 DIAGNOSIS — Z5112 Encounter for antineoplastic immunotherapy: Secondary | ICD-10-CM

## 2016-04-07 DIAGNOSIS — Z5111 Encounter for antineoplastic chemotherapy: Secondary | ICD-10-CM

## 2016-04-07 DIAGNOSIS — C9001 Multiple myeloma in remission: Secondary | ICD-10-CM | POA: Diagnosis not present

## 2016-04-07 LAB — CBC WITH DIFFERENTIAL/PLATELET
BASO%: 0.2 % (ref 0.0–2.0)
BASOS ABS: 0 10*3/uL (ref 0.0–0.1)
EOS ABS: 0.1 10*3/uL (ref 0.0–0.5)
EOS%: 0.7 % (ref 0.0–7.0)
HEMATOCRIT: 34.9 % — AB (ref 38.4–49.9)
HGB: 12.2 g/dL — ABNORMAL LOW (ref 13.0–17.1)
LYMPH#: 2.8 10*3/uL (ref 0.9–3.3)
LYMPH%: 22.9 % (ref 14.0–49.0)
MCH: 32.6 pg (ref 27.2–33.4)
MCHC: 35 g/dL (ref 32.0–36.0)
MCV: 93.3 fL (ref 79.3–98.0)
MONO#: 1.2 10*3/uL — AB (ref 0.1–0.9)
MONO%: 9.9 % (ref 0.0–14.0)
NEUT%: 66.3 % (ref 39.0–75.0)
NEUTROS ABS: 8.1 10*3/uL — AB (ref 1.5–6.5)
Platelets: 332 10*3/uL (ref 140–400)
RBC: 3.74 10*6/uL — AB (ref 4.20–5.82)
RDW: 14.7 % — AB (ref 11.0–14.6)
WBC: 12.2 10*3/uL — ABNORMAL HIGH (ref 4.0–10.3)
nRBC: 0 % (ref 0–0)

## 2016-04-07 LAB — COMPREHENSIVE METABOLIC PANEL
ALT: 43 U/L (ref 0–55)
AST: 20 U/L (ref 5–34)
Albumin: 3.7 g/dL (ref 3.5–5.0)
Alkaline Phosphatase: 59 U/L (ref 40–150)
Anion Gap: 9 mEq/L (ref 3–11)
BUN: 20.9 mg/dL (ref 7.0–26.0)
CHLORIDE: 103 meq/L (ref 98–109)
CO2: 28 meq/L (ref 22–29)
CREATININE: 1.2 mg/dL (ref 0.7–1.3)
Calcium: 9.5 mg/dL (ref 8.4–10.4)
EGFR: 80 mL/min/{1.73_m2} — ABNORMAL LOW (ref >90)
GLUCOSE: 98 mg/dL (ref 70–140)
Potassium: 3.5 mEq/L (ref 3.5–5.1)
SODIUM: 140 meq/L (ref 136–145)
Total Bilirubin: 0.44 mg/dL (ref 0.20–1.20)
Total Protein: 6.6 g/dL (ref 6.4–8.3)

## 2016-04-07 MED ORDER — DEXTROSE 5 % IV SOLN
36.0000 mg/m2 | Freq: Once | INTRAVENOUS | Status: AC
  Administered 2016-04-07: 68 mg via INTRAVENOUS
  Filled 2016-04-07: qty 30

## 2016-04-07 MED ORDER — CYCLOPHOSPHAMIDE CHEMO INJECTION 1 GM
300.0000 mg/m2 | Freq: Once | INTRAMUSCULAR | Status: AC
  Administered 2016-04-07: 560 mg via INTRAVENOUS
  Filled 2016-04-07: qty 28

## 2016-04-07 MED ORDER — PALONOSETRON HCL INJECTION 0.25 MG/5ML
0.2500 mg | Freq: Once | INTRAVENOUS | Status: AC
  Administered 2016-04-07: 0.25 mg via INTRAVENOUS

## 2016-04-07 MED ORDER — PALONOSETRON HCL INJECTION 0.25 MG/5ML
INTRAVENOUS | Status: AC
  Filled 2016-04-07: qty 5

## 2016-04-07 MED ORDER — SODIUM CHLORIDE 0.9 % IV SOLN
Freq: Once | INTRAVENOUS | Status: DC
Start: 2016-04-07 — End: 2016-04-07

## 2016-04-07 MED ORDER — SODIUM CHLORIDE 0.9 % IV SOLN
Freq: Once | INTRAVENOUS | Status: AC
  Administered 2016-04-07: 15:00:00 via INTRAVENOUS

## 2016-04-07 MED ORDER — SODIUM CHLORIDE 0.9 % IV SOLN
10.0000 mg | Freq: Once | INTRAVENOUS | Status: AC
  Administered 2016-04-07: 10 mg via INTRAVENOUS
  Filled 2016-04-07: qty 1

## 2016-04-07 MED ORDER — HEPARIN SOD (PORK) LOCK FLUSH 100 UNIT/ML IV SOLN
500.0000 [IU] | Freq: Once | INTRAVENOUS | Status: AC | PRN
  Administered 2016-04-07: 500 [IU]
  Filled 2016-04-07: qty 5

## 2016-04-07 MED ORDER — SODIUM CHLORIDE 0.9% FLUSH
10.0000 mL | INTRAVENOUS | Status: DC | PRN
  Administered 2016-04-07: 10 mL
  Filled 2016-04-07: qty 10

## 2016-04-07 MED FILL — DICLOFENAC SODIUM 1% GEL: 1 | 12 days supply | Qty: 100 | Fill #2

## 2016-04-07 NOTE — Patient Instructions (Signed)
[facility] Discharge Instructions for Patients Receiving Chemotherapy  Today you received the following chemotherapy agents Cytoxan and Kyprolis  To help prevent nausea and vomiting after your treatment, we encourage you to take your nausea medication as directed   If you develop nausea and vomiting that is not controlled by your nausea medication, call the clinic.   BELOW ARE SYMPTOMS THAT SHOULD BE REPORTED IMMEDIATELY:  *FEVER GREATER THAN 100.5 F  *CHILLS WITH OR WITHOUT FEVER  NAUSEA AND VOMITING THAT IS NOT CONTROLLED WITH YOUR NAUSEA MEDICATION  *UNUSUAL SHORTNESS OF BREATH  *UNUSUAL BRUISING OR BLEEDING  TENDERNESS IN MOUTH AND THROAT WITH OR WITHOUT PRESENCE OF ULCERS  *URINARY PROBLEMS  *BOWEL PROBLEMS  UNUSUAL RASH Items with * indicate a potential emergency and should be followed up as soon as possible.  Feel free to call the clinic you have any questions or concerns. The clinic phone number is [phone].  Please show the CHEMO ALERT CARD at check-in to the Emergency Department and triage nurse.   

## 2016-04-08 ENCOUNTER — Ambulatory Visit (HOSPITAL_BASED_OUTPATIENT_CLINIC_OR_DEPARTMENT_OTHER): Payer: Medicaid Other

## 2016-04-08 VITALS — BP 122/69 | HR 94 | Temp 98.4°F | Resp 20

## 2016-04-08 DIAGNOSIS — Z5112 Encounter for antineoplastic immunotherapy: Secondary | ICD-10-CM

## 2016-04-08 DIAGNOSIS — C9001 Multiple myeloma in remission: Secondary | ICD-10-CM

## 2016-04-08 MED ORDER — DEXTROSE 5 % IV SOLN
36.0000 mg/m2 | Freq: Once | INTRAVENOUS | Status: AC
  Administered 2016-04-08: 68 mg via INTRAVENOUS
  Filled 2016-04-08: qty 30

## 2016-04-08 MED ORDER — SODIUM CHLORIDE 0.9 % IV SOLN
Freq: Once | INTRAVENOUS | Status: AC
  Administered 2016-04-08: 14:00:00 via INTRAVENOUS

## 2016-04-08 MED ORDER — SODIUM CHLORIDE 0.9% FLUSH
10.0000 mL | INTRAVENOUS | Status: DC | PRN
  Administered 2016-04-08: 10 mL
  Filled 2016-04-08: qty 10

## 2016-04-08 MED ORDER — SODIUM CHLORIDE 0.9 % IV SOLN: Freq: Once | INTRAVENOUS | Status: DC

## 2016-04-08 MED ORDER — HEPARIN SOD (PORK) LOCK FLUSH 100 UNIT/ML IV SOLN
500.0000 [IU] | Freq: Once | INTRAVENOUS | Status: AC | PRN
  Administered 2016-04-08: 500 [IU]
  Filled 2016-04-08: qty 5

## 2016-04-08 MED ORDER — SODIUM CHLORIDE 0.9 % IV SOLN
10.0000 mg | Freq: Once | INTRAVENOUS | Status: AC
  Administered 2016-04-08: 10 mg via INTRAVENOUS
  Filled 2016-04-08: qty 1

## 2016-04-08 NOTE — Patient Instructions (Signed)
[facility] Discharge Instructions for Patients Receiving Chemotherapy  Today you received the following chemotherapy agents Kyprolis  To help prevent nausea and vomiting after your treatment, we encourage you to take your nausea medication    If you develop nausea and vomiting that is not controlled by your nausea medication, call the clinic.   BELOW ARE SYMPTOMS THAT SHOULD BE REPORTED IMMEDIATELY:  *FEVER GREATER THAN 100.5 F  *CHILLS WITH OR WITHOUT FEVER  NAUSEA AND VOMITING THAT IS NOT CONTROLLED WITH YOUR NAUSEA MEDICATION  *UNUSUAL SHORTNESS OF BREATH  *UNUSUAL BRUISING OR BLEEDING  TENDERNESS IN MOUTH AND THROAT WITH OR WITHOUT PRESENCE OF ULCERS  *URINARY PROBLEMS  *BOWEL PROBLEMS  UNUSUAL RASH Items with * indicate a potential emergency and should be followed up as soon as possible.  Feel free to call the clinic you have any questions or concerns. The clinic phone number is [phone].  Please show the CHEMO ALERT CARD at check-in to the Emergency Department and triage nurse.   

## 2016-04-14 ENCOUNTER — Other Ambulatory Visit (HOSPITAL_BASED_OUTPATIENT_CLINIC_OR_DEPARTMENT_OTHER): Payer: Medicaid Other

## 2016-04-14 ENCOUNTER — Ambulatory Visit: Payer: Medicaid Other

## 2016-04-14 ENCOUNTER — Ambulatory Visit (HOSPITAL_BASED_OUTPATIENT_CLINIC_OR_DEPARTMENT_OTHER): Payer: Medicaid Other

## 2016-04-14 ENCOUNTER — Other Ambulatory Visit: Payer: Self-pay | Admitting: Hematology and Oncology

## 2016-04-14 VITALS — BP 127/80 | HR 84 | Temp 98.5°F | Resp 16

## 2016-04-14 DIAGNOSIS — R803 Bence Jones proteinuria: Secondary | ICD-10-CM

## 2016-04-14 DIAGNOSIS — C9001 Multiple myeloma in remission: Secondary | ICD-10-CM

## 2016-04-14 DIAGNOSIS — Z5112 Encounter for antineoplastic immunotherapy: Secondary | ICD-10-CM

## 2016-04-14 DIAGNOSIS — Z5111 Encounter for antineoplastic chemotherapy: Secondary | ICD-10-CM | POA: Diagnosis not present

## 2016-04-14 DIAGNOSIS — M79605 Pain in left leg: Secondary | ICD-10-CM

## 2016-04-14 LAB — CBC WITH DIFFERENTIAL/PLATELET
BASO%: 0.1 % (ref 0.0–2.0)
BASOS ABS: 0 10*3/uL (ref 0.0–0.1)
EOS%: 0.3 % (ref 0.0–7.0)
Eosinophils Absolute: 0 10*3/uL (ref 0.0–0.5)
HEMATOCRIT: 34.7 % — AB (ref 38.4–49.9)
HGB: 12 g/dL — ABNORMAL LOW (ref 13.0–17.1)
LYMPH#: 2.4 10*3/uL (ref 0.9–3.3)
LYMPH%: 24.3 % (ref 14.0–49.0)
MCH: 33.1 pg (ref 27.2–33.4)
MCHC: 34.6 g/dL (ref 32.0–36.0)
MCV: 95.9 fL (ref 79.3–98.0)
MONO#: 0.8 10*3/uL (ref 0.1–0.9)
MONO%: 8 % (ref 0.0–14.0)
NEUT#: 6.8 10*3/uL — ABNORMAL HIGH (ref 1.5–6.5)
NEUT%: 67.3 % (ref 39.0–75.0)
PLATELETS: 150 10*3/uL (ref 140–400)
RBC: 3.62 10*6/uL — ABNORMAL LOW (ref 4.20–5.82)
RDW: 15.3 % — ABNORMAL HIGH (ref 11.0–14.6)
WBC: 10.1 10*3/uL (ref 4.0–10.3)

## 2016-04-14 LAB — COMPREHENSIVE METABOLIC PANEL
ALBUMIN: 3.6 g/dL (ref 3.5–5.0)
ALK PHOS: 54 U/L (ref 40–150)
ALT: 29 U/L (ref 0–55)
ANION GAP: 10 meq/L (ref 3–11)
AST: 19 U/L (ref 5–34)
BUN: 15.8 mg/dL (ref 7.0–26.0)
CALCIUM: 9 mg/dL (ref 8.4–10.4)
CHLORIDE: 105 meq/L (ref 98–109)
CO2: 26 mEq/L (ref 22–29)
Creatinine: 1.2 mg/dL (ref 0.7–1.3)
EGFR: 78 mL/min/{1.73_m2} — AB (ref >90)
Glucose: 181 mg/dl — ABNORMAL HIGH (ref 70–140)
POTASSIUM: 3.5 meq/L (ref 3.5–5.1)
Sodium: 141 mEq/L (ref 136–145)
Total Bilirubin: 0.59 mg/dL (ref 0.20–1.20)
Total Protein: 6.4 g/dL (ref 6.4–8.3)

## 2016-04-14 MED ORDER — SODIUM CHLORIDE 0.9 % IV SOLN
Freq: Once | INTRAVENOUS | Status: AC
  Administered 2016-04-14: 14:00:00 via INTRAVENOUS

## 2016-04-14 MED ORDER — HEPARIN SOD (PORK) LOCK FLUSH 100 UNIT/ML IV SOLN
500.0000 [IU] | Freq: Once | INTRAVENOUS | Status: AC | PRN
  Administered 2016-04-14: 500 [IU]
  Filled 2016-04-14: qty 5

## 2016-04-14 MED ORDER — DEXTROSE 5 % IV SOLN: 36.0000 mg/m2 | Freq: Once | INTRAVENOUS | Status: DC

## 2016-04-14 MED ORDER — SODIUM CHLORIDE 0.9% FLUSH
10.0000 mL | INTRAVENOUS | Status: DC | PRN
  Administered 2016-04-14: 10 mL
  Filled 2016-04-14: qty 10

## 2016-04-14 MED ORDER — PALONOSETRON HCL INJECTION 0.25 MG/5ML
0.2500 mg | Freq: Once | INTRAVENOUS | Status: AC
  Administered 2016-04-14: 0.25 mg via INTRAVENOUS

## 2016-04-14 MED ORDER — SODIUM CHLORIDE 0.9 % IV SOLN
300.0000 mg/m2 | Freq: Once | INTRAVENOUS | Status: AC
  Administered 2016-04-14: 560 mg via INTRAVENOUS
  Filled 2016-04-14: qty 28

## 2016-04-14 MED ORDER — SODIUM CHLORIDE 0.9 % IV SOLN
10.0000 mg | Freq: Once | INTRAVENOUS | Status: AC
  Administered 2016-04-14: 10 mg via INTRAVENOUS
  Filled 2016-04-14: qty 1

## 2016-04-14 MED ORDER — PALONOSETRON HCL INJECTION 0.25 MG/5ML
INTRAVENOUS | Status: AC
  Filled 2016-04-14: qty 5

## 2016-04-14 MED ORDER — DEXTROSE 5 % IV SOLN
36.0000 mg/m2 | Freq: Once | INTRAVENOUS | Status: AC
  Administered 2016-04-14: 68 mg via INTRAVENOUS
  Filled 2016-04-14: qty 30

## 2016-04-14 MED FILL — traMADol HCL 50 MG TABS: 50 | 11 days supply | Qty: 90 | Fill #1

## 2016-04-14 MED FILL — PROCHLORPERAZINE 10 MG TAB: 10 | 7 days supply | Qty: 30 | Fill #1

## 2016-04-14 MED FILL — ZOLPIDEM TARTRATE 10 MG TAB: 10 | 30 days supply | Qty: 30 | Fill #2

## 2016-04-14 MED FILL — ACYCLOVIR 400 MG TABLET: 400 | 30 days supply | Qty: 60 | Fill #3

## 2016-04-14 NOTE — Patient Instructions (Signed)
[facility] Discharge Instructions for Patients Receiving Chemotherapy  Today you received the following chemotherapy agents Cytoxan and Kyprolis  To help prevent nausea and vomiting after your treatment, we encourage you to take your nausea medication as directed. No Zofran for 3 days.   Take Compazine instead. If you develop nausea and vomiting that is not controlled by your nausea medication, call the clinic.   BELOW ARE SYMPTOMS THAT SHOULD BE REPORTED IMMEDIATELY:  *FEVER GREATER THAN 100.5 F  *CHILLS WITH OR WITHOUT FEVER  NAUSEA AND VOMITING THAT IS NOT CONTROLLED WITH YOUR NAUSEA MEDICATION  *UNUSUAL SHORTNESS OF BREATH  *UNUSUAL BRUISING OR BLEEDING  TENDERNESS IN MOUTH AND THROAT WITH OR WITHOUT PRESENCE OF ULCERS  *URINARY PROBLEMS  *BOWEL PROBLEMS  UNUSUAL RASH Items with * indicate a potential emergency and should be followed up as soon as possible.  Feel free to call the clinic you have any questions or concerns. The clinic phone number is [phone].  Please show the [place] at check-in to the Emergency Department and triage nurse.

## 2016-04-15 ENCOUNTER — Ambulatory Visit (HOSPITAL_BASED_OUTPATIENT_CLINIC_OR_DEPARTMENT_OTHER): Payer: Medicaid Other

## 2016-04-15 VITALS — BP 126/77 | HR 88 | Temp 97.9°F | Resp 17

## 2016-04-15 DIAGNOSIS — Z5112 Encounter for antineoplastic immunotherapy: Secondary | ICD-10-CM

## 2016-04-15 DIAGNOSIS — C9001 Multiple myeloma in remission: Secondary | ICD-10-CM

## 2016-04-15 LAB — KAPPA/LAMBDA LIGHT CHAINS
IG KAPPA FREE LIGHT CHAIN: 5.1 mg/L (ref 3.3–19.4)
IG LAMBDA FREE LIGHT CHAIN: 248 mg/L — AB (ref 5.7–26.3)
Kappa/Lambda FluidC Ratio: 0.02 — ABNORMAL LOW (ref 0.26–1.65)

## 2016-04-15 MED ORDER — SODIUM CHLORIDE 0.9 % IV SOLN
10.0000 mg | Freq: Once | INTRAVENOUS | Status: AC
  Administered 2016-04-15: 10 mg via INTRAVENOUS
  Filled 2016-04-15: qty 1

## 2016-04-15 MED ORDER — SODIUM CHLORIDE 0.9 % IV SOLN
Freq: Once | INTRAVENOUS | Status: AC
  Administered 2016-04-15: 13:00:00 via INTRAVENOUS

## 2016-04-15 MED ORDER — HEPARIN SOD (PORK) LOCK FLUSH 100 UNIT/ML IV SOLN
500.0000 [IU] | Freq: Once | INTRAVENOUS | Status: AC | PRN
  Administered 2016-04-15: 500 [IU]
  Filled 2016-04-15: qty 5

## 2016-04-15 MED ORDER — SODIUM CHLORIDE 0.9% FLUSH
10.0000 mL | INTRAVENOUS | Status: DC | PRN
  Administered 2016-04-15: 10 mL
  Filled 2016-04-15: qty 10

## 2016-04-15 MED ORDER — CARFILZOMIB CHEMO INJECTION 60 MG
36.0000 mg/m2 | Freq: Once | INTRAVENOUS | Status: AC
  Administered 2016-04-15: 68 mg via INTRAVENOUS
  Filled 2016-04-15: qty 34

## 2016-04-15 NOTE — Patient Instructions (Signed)
[facility] Discharge Instructions for Patients Receiving Chemotherapy  Today you received the following chemotherapy agents: Kyprolis   To help prevent nausea and vomiting after your treatment, we encourage you to take your nausea medication as directed.    If you develop nausea and vomiting that is not controlled by your nausea medication, call the clinic.   BELOW ARE SYMPTOMS THAT SHOULD BE REPORTED IMMEDIATELY:  *FEVER GREATER THAN 100.5 F  *CHILLS WITH OR WITHOUT FEVER  NAUSEA AND VOMITING THAT IS NOT CONTROLLED WITH YOUR NAUSEA MEDICATION  *UNUSUAL SHORTNESS OF BREATH  *UNUSUAL BRUISING OR BLEEDING  TENDERNESS IN MOUTH AND THROAT WITH OR WITHOUT PRESENCE OF ULCERS  *URINARY PROBLEMS  *BOWEL PROBLEMS  UNUSUAL RASH Items with * indicate a potential emergency and should be followed up as soon as possible.  Feel free to call the clinic you have any questions or concerns. The clinic phone number is [phone].  Please show the CHEMO ALERT CARD at check-in to the Emergency Department and triage nurse.   

## 2016-04-19 LAB — MULTIPLE MYELOMA PANEL, SERUM
ALBUMIN/GLOB SERPL: 1.7 (ref 0.7–1.7)
ALPHA 1: 0.2 g/dL (ref 0.0–0.4)
Albumin SerPl Elph-Mcnc: 3.8 g/dL (ref 2.9–4.4)
Alpha2 Glob SerPl Elph-Mcnc: 0.7 g/dL (ref 0.4–1.0)
B-GLOBULIN SERPL ELPH-MCNC: 1 g/dL (ref 0.7–1.3)
GAMMA GLOB SERPL ELPH-MCNC: 0.4 g/dL (ref 0.4–1.8)
GLOBULIN, TOTAL: 2.3 g/dL (ref 2.2–3.9)
IGA/IMMUNOGLOBULIN A, SERUM: 28 mg/dL — AB (ref 90–386)
IgG, Qn, Serum: 537 mg/dL — ABNORMAL LOW (ref 700–1600)
IgM, Qn, Serum: 5 mg/dL — ABNORMAL LOW (ref 20–172)
Total Protein: 6.1 g/dL (ref 6.0–8.5)

## 2016-04-19 MED FILL — OFLOXACIN 0.3% EYE DROPS: 0.3 | 12 days supply | Qty: 5 | Fill #0

## 2016-04-19 MED FILL — TRAVATAN Z 0.004% EYE DROP: 0.004 | 30 days supply | Qty: 5 | Fill #0

## 2016-04-19 MED FILL — PREDNISOLONE AC 1% EYE DROP: 1 | 12 days supply | Qty: 10 | Fill #0

## 2016-04-20 ENCOUNTER — Other Ambulatory Visit: Payer: Self-pay | Admitting: Hematology and Oncology

## 2016-04-20 DIAGNOSIS — C9001 Multiple myeloma in remission: Secondary | ICD-10-CM

## 2016-04-21 ENCOUNTER — Ambulatory Visit (HOSPITAL_BASED_OUTPATIENT_CLINIC_OR_DEPARTMENT_OTHER): Payer: Medicaid Other | Admitting: Hematology and Oncology

## 2016-04-21 ENCOUNTER — Ambulatory Visit: Payer: Medicaid Other | Admitting: Nurse Practitioner

## 2016-04-21 ENCOUNTER — Encounter: Payer: Self-pay | Admitting: *Deleted

## 2016-04-21 ENCOUNTER — Other Ambulatory Visit (HOSPITAL_BASED_OUTPATIENT_CLINIC_OR_DEPARTMENT_OTHER): Payer: Medicaid Other

## 2016-04-21 ENCOUNTER — Telehealth: Payer: Self-pay | Admitting: Hematology and Oncology

## 2016-04-21 ENCOUNTER — Ambulatory Visit (HOSPITAL_BASED_OUTPATIENT_CLINIC_OR_DEPARTMENT_OTHER): Payer: Medicaid Other

## 2016-04-21 ENCOUNTER — Encounter: Payer: Self-pay | Admitting: Hematology and Oncology

## 2016-04-21 DIAGNOSIS — M79605 Pain in left leg: Secondary | ICD-10-CM | POA: Diagnosis not present

## 2016-04-21 DIAGNOSIS — C9001 Multiple myeloma in remission: Secondary | ICD-10-CM

## 2016-04-21 DIAGNOSIS — H10503 Unspecified blepharoconjunctivitis, bilateral: Secondary | ICD-10-CM

## 2016-04-21 DIAGNOSIS — Z8042 Family history of malignant neoplasm of prostate: Secondary | ICD-10-CM

## 2016-04-21 DIAGNOSIS — R0600 Dyspnea, unspecified: Secondary | ICD-10-CM | POA: Diagnosis not present

## 2016-04-21 DIAGNOSIS — G620 Drug-induced polyneuropathy: Secondary | ICD-10-CM | POA: Diagnosis not present

## 2016-04-21 DIAGNOSIS — Z5112 Encounter for antineoplastic immunotherapy: Secondary | ICD-10-CM

## 2016-04-21 DIAGNOSIS — G893 Neoplasm related pain (acute) (chronic): Secondary | ICD-10-CM

## 2016-04-21 DIAGNOSIS — T451X5A Adverse effect of antineoplastic and immunosuppressive drugs, initial encounter: Secondary | ICD-10-CM

## 2016-04-21 DIAGNOSIS — M898X9 Other specified disorders of bone, unspecified site: Secondary | ICD-10-CM

## 2016-04-21 DIAGNOSIS — Z95828 Presence of other vascular implants and grafts: Secondary | ICD-10-CM

## 2016-04-21 DIAGNOSIS — D61810 Antineoplastic chemotherapy induced pancytopenia: Secondary | ICD-10-CM

## 2016-04-21 LAB — CBC WITH DIFFERENTIAL/PLATELET
BASO%: 0.4 % (ref 0.0–2.0)
Basophils Absolute: 0.1 10*3/uL (ref 0.0–0.1)
EOS%: 0.3 % (ref 0.0–7.0)
Eosinophils Absolute: 0 10*3/uL (ref 0.0–0.5)
HCT: 35.9 % — ABNORMAL LOW (ref 38.4–49.9)
HGB: 11.9 g/dL — ABNORMAL LOW (ref 13.0–17.1)
LYMPH%: 14.2 % (ref 14.0–49.0)
MCH: 33 pg (ref 27.2–33.4)
MCHC: 33.1 g/dL (ref 32.0–36.0)
MCV: 99.9 fL — ABNORMAL HIGH (ref 79.3–98.0)
MONO#: 0.9 10*3/uL (ref 0.1–0.9)
MONO%: 7 % (ref 0.0–14.0)
NEUT%: 78.1 % — ABNORMAL HIGH (ref 39.0–75.0)
NEUTROS ABS: 10.7 10*3/uL — AB (ref 1.5–6.5)
PLATELETS: 179 10*3/uL (ref 140–400)
RBC: 3.6 10*6/uL — AB (ref 4.20–5.82)
RDW: 16.7 % — ABNORMAL HIGH (ref 11.0–14.6)
WBC: 13.6 10*3/uL — AB (ref 4.0–10.3)
lymph#: 1.9 10*3/uL (ref 0.9–3.3)

## 2016-04-21 LAB — COMPREHENSIVE METABOLIC PANEL
ALT: 31 U/L (ref 0–55)
AST: 17 U/L (ref 5–34)
Albumin: 3.7 g/dL (ref 3.5–5.0)
Alkaline Phosphatase: 50 U/L (ref 40–150)
Anion Gap: 9 mEq/L (ref 3–11)
BUN: 21.3 mg/dL (ref 7.0–26.0)
CO2: 26 meq/L (ref 22–29)
CREATININE: 1 mg/dL (ref 0.7–1.3)
Calcium: 9.5 mg/dL (ref 8.4–10.4)
Chloride: 107 mEq/L (ref 98–109)
EGFR: >90 mL/min/{1.73_m2} (ref >90)
GLUCOSE: 91 mg/dL (ref 70–140)
Potassium: 4 mEq/L (ref 3.5–5.1)
SODIUM: 141 meq/L (ref 136–145)
Total Bilirubin: 0.56 mg/dL (ref 0.20–1.20)
Total Protein: 6.6 g/dL (ref 6.4–8.3)

## 2016-04-21 MED ORDER — PALONOSETRON HCL INJECTION 0.25 MG/5ML
INTRAVENOUS | Status: AC
  Filled 2016-04-21: qty 5

## 2016-04-21 MED ORDER — GABAPENTIN 300 MG PO CAPS: 600.0000 mg | ORAL_CAPSULE | Freq: Three times a day (TID) | ORAL | 6 refills | Status: DC

## 2016-04-21 MED ORDER — SODIUM CHLORIDE 0.9 % IJ SOLN
10.0000 mL | INTRAMUSCULAR | Status: DC | PRN
  Administered 2016-04-21: 10 mL via INTRAVENOUS
  Filled 2016-04-21: qty 10

## 2016-04-21 MED ORDER — SODIUM CHLORIDE 0.9% FLUSH
10.0000 mL | INTRAVENOUS | Status: DC | PRN
  Administered 2016-04-21: 10 mL
  Filled 2016-04-21: qty 10

## 2016-04-21 MED ORDER — SODIUM CHLORIDE 0.9 % IV SOLN
10.0000 mg | Freq: Once | INTRAVENOUS | Status: AC
  Administered 2016-04-21: 10 mg via INTRAVENOUS
  Filled 2016-04-21: qty 1

## 2016-04-21 MED ORDER — SODIUM CHLORIDE 0.9 % IV SOLN
Freq: Once | INTRAVENOUS | Status: AC
  Administered 2016-04-21: 15:00:00 via INTRAVENOUS

## 2016-04-21 MED ORDER — SODIUM CHLORIDE 0.9 % IV SOLN
300.0000 mg/m2 | Freq: Once | INTRAVENOUS | Status: AC
  Administered 2016-04-21: 560 mg via INTRAVENOUS
  Filled 2016-04-21: qty 28

## 2016-04-21 MED ORDER — HEPARIN SOD (PORK) LOCK FLUSH 100 UNIT/ML IV SOLN
500.0000 [IU] | Freq: Once | INTRAVENOUS | Status: AC | PRN
  Administered 2016-04-21: 500 [IU]
  Filled 2016-04-21: qty 5

## 2016-04-21 MED ORDER — CARFILZOMIB CHEMO INJECTION 60 MG
36.0000 mg/m2 | Freq: Once | INTRAVENOUS | Status: AC
  Administered 2016-04-21: 68 mg via INTRAVENOUS
  Filled 2016-04-21: qty 30

## 2016-04-21 MED ORDER — ZOLPIDEM TARTRATE 10 MG PO TABS: 10.0000 mg | ORAL_TABLET | Freq: Every evening | ORAL | 3 refills | Status: DC | PRN

## 2016-04-21 MED ORDER — PALONOSETRON HCL INJECTION 0.25 MG/5ML
0.2500 mg | Freq: Once | INTRAVENOUS | Status: AC
  Administered 2016-04-21: 0.25 mg via INTRAVENOUS

## 2016-04-21 MED FILL — GABAPENTIN 300 MG CAPSULE: 300 | 15 days supply | Qty: 90 | Fill #0

## 2016-04-21 NOTE — Patient Instructions (Signed)

## 2016-04-21 NOTE — Progress Notes (Signed)
Faxed Form back to [employer], states ok w/ Dr. Alvy Bimler for pt to hold Aspirin for one to two weeks for a procedure called "Pterygium Excision OS."

## 2016-04-21 NOTE — Progress Notes (Signed)
[facility] OFFICE PROGRESS NOTE  Patient Care Team: Boykin Nearing, MD as PCP - General (Family Medicine) Reola Calkins, MD as Consulting Physician (Hematology and Oncology)  SUMMARY OF ONCOLOGIC HISTORY:   Multiple myeloma in remission [facility])   08/28/2015 Bone Marrow Biopsy    Accession: [id number] biopsy showed 58% myeloma involvement. Cytogenetics was normal and FISH showed Del 13q and t(11;14).       09/07/2015 - 01/22/2016 Chemotherapy    he received 6 cycles of treatment of Velcade and Revlimid with Dex       09/12/2015 - 09/16/2015 Hospital Admission    He was admitted to the hospital with sepsis, treatment was placed on hold and he received one dose of GCSF for leukopenia      01/12/2016 Adverse Reaction    Delay treatment due to neutropenia      01/27/2016 Imaging    ECHO at [facility] showed normal EF      01/27/2016 PET scan    PET scan at [facility] showed innumerable lytic lesions throughout      01/27/2016 Procedure    PFT is within normal limits at [facility]      01/28/2016 Bone Marrow Biopsy    Bone marrow at [facility] showed persistent plasma cell myeloma in a normocellularmarrow (30%) with 20% atypical plasma cells and myeloid hyperplasia.      02/11/2016 -  Chemotherapy    He is started on salvage Rx with Kyprolis, Cytoxan and dexamethasone      03/24/2016 Imaging    US venous Doppler showed no evidence of deep vein or superficial thrombosis involving the right lower extremity and left lower extremity. No evidence of Baker&'s cyst on the right or left.       INTERVAL HISTORY: Please see below for problem oriented charting. He returns today for follow-up with his wife. He continues to have intermittent calf pain. He has shortness of breath on moderate exertion. Denies new bone pain. His gumline is well-healed from recent dental extraction. He continues to complain of peripheral neuropathy in both feet The patient denies any  recent signs or symptoms of bleeding such as spontaneous epistaxis, hematuria or hematochezia. The patient wants to proceed with eye surgery next week due to blurriness of vision and irritation from conjunctivitis  REVIEW OF SYSTEMS:   Constitutional: Denies fevers, chills or abnormal weight loss Ears, nose, mouth, throat, and face: Denies mucositis or sore throat Cardiovascular: Denies palpitation, chest discomfort or lower extremity swelling Gastrointestinal:  Denies nausea, heartburn or change in bowel habits Skin: Denies abnormal skin rashes Lymphatics: Denies new lymphadenopathy or easy bruising Neurological:Denies numbness, tingling or new weaknesses Behavioral/Psych: Mood is stable, no new changes  All other systems were reviewed with the patient and are negative.  I have reviewed the past medical history, past surgical history, social history and family history with the patient and they are unchanged from previous note.  ALLERGIES:  is allergic to benadryl [diphenhydramine]; trazodone and nefazodone; and latex.  MEDICATIONS:  Current Outpatient Prescriptions  Medication Sig Dispense Refill  . acyclovir (ZOVIRAX) 400 MG tablet Take 1 tablet (400 mg total) by mouth 2 (two) times daily. 60 tablet 3  . amLODipine (NORVASC) 10 MG tablet Take 1 tablet (10 mg total) by mouth daily. 30 tablet 1  . aspirin EC 81 MG tablet Take 81 mg by mouth daily.    . bisacodyl (DULCOLAX) 5 MG EC tablet Take 5 mg by mouth 2 (two) times daily  as needed for moderate constipation.    Marland Kitchen CALCIUM PO Take 1 tablet by mouth daily.    . cholecalciferol (VITAMIN D) 1000 units tablet Take 2,000 Units by mouth daily.    Marland Kitchen dexamethasone (DECADRON) 4 MG tablet Take 5 pills every week on Thursdays with food (Patient taking differently: Take 5 pills every week on Fridays with food) 40 tablet 4  . diclofenac sodium (VOLTAREN) 1 % GEL Apply 2 g topically 4 (four) times daily. (Patient taking differently: Apply 2 g  topically 4 (four) times daily as needed (pain). ) 100 g 2  . gabapentin (NEURONTIN) 300 MG capsule Take 2 capsules (600 mg total) by mouth 3 (three) times daily. 90 capsule 6  . lidocaine-prilocaine (EMLA) cream Apply to affected area once 30 g 3  . lisinopril-hydrochlorothiazide (PRINZIDE,ZESTORETIC) 20-25 MG tablet Take 1 tablet by mouth daily. 90 tablet 3  . pantoprazole (PROTONIX) 40 MG tablet Take 1 tablet (40 mg total) by mouth daily. 30 tablet 6  . polyethylene glycol (MIRALAX / GLYCOLAX) packet Take 17 g by mouth 2 (two) times daily. 14 each 0  . traMADol (ULTRAM) 50 MG tablet Take 1-2 tablets (50-100 mg total) by mouth every 6 (six) hours as needed. for pain 90 tablet 0  . ondansetron (ZOFRAN) 8 MG tablet Take 1 tablet (8 mg total) by mouth every 8 (eight) hours as needed for nausea. (Patient not taking: Reported on 04/21/2016) 60 tablet 3  . prochlorperazine (COMPAZINE) 10 MG tablet Take 1 tablet (10 mg total) by mouth every 6 (six) hours as needed (Nausea or vomiting). (Patient not taking: Reported on 04/21/2016) 30 tablet 9  . senna-docusate (SENOKOT-S) 8.6-50 MG tablet Take 1 tablet by mouth 2 (two) times daily. (Patient not taking: Reported on 04/21/2016) 60 tablet 3  . zolpidem (AMBIEN) 10 MG tablet Take 1 tablet (10 mg total) by mouth at bedtime as needed for sleep. 30 tablet 3   No current facility-administered medications for this visit.    Facility-Administered Medications Ordered in Other Visits  Medication Dose Route Frequency Provider Last Rate Last Dose  . sodium chloride 0.9 % injection 10 mL  10 mL Intravenous PRN Heath Lark, MD   10 mL at 04/21/16 1245    PHYSICAL EXAMINATION: ECOG PERFORMANCE STATUS: 1 - Symptomatic but completely ambulatory  Vitals:   04/21/16 1255  BP: 119/86  Pulse: 67  Resp: 18  Temp: 98.5 F (36.9 C)   Filed Weights   04/21/16 1255  Weight: 162 lb 8 oz (73.7 kg)    GENERAL:alert, no distress and comfortable SKIN: skin color, texture,  turgor are normal, no rashes or significant lesions EYES: Noted bilateral conjunctivitis and evidence of cataracts OROPHARYNX:no exudate, no erythema and lips, buccal mucosa, and tongue normal  NECK: supple, thyroid normal size, non-tender, without nodularity LYMPH:  no palpable lymphadenopathy in the cervical, axillary or inguinal LUNGS: clear to auscultation and percussion with normal breathing effort HEART: regular rate & rhythm and no murmurs and no lower extremity edema ABDOMEN:abdomen soft, non-tender and normal bowel sounds Musculoskeletal:no cyanosis of digits and no clubbing  NEURO: alert & oriented x 3 with fluent speech, no focal motor/sensory deficits  LABORATORY DATA:  I have reviewed the data as listed    Component Value Date/Time   NA 141 04/21/2016 1131   K 4.0 04/21/2016 1131   CL 106 12/14/2015 0631   CO2 26 04/21/2016 1131   GLUCOSE 91 04/21/2016 1131   BUN 21.3 04/21/2016 1131  CREATININE 1.0 04/21/2016 1131   CALCIUM 9.5 04/21/2016 1131   PROT 6.6 04/21/2016 1131   ALBUMIN 3.7 04/21/2016 1131   AST 17 04/21/2016 1131   ALT 31 04/21/2016 1131   ALKPHOS 50 04/21/2016 1131   BILITOT 0.56 04/21/2016 1131   GFRNONAA >60 12/14/2015 0631   GFRNONAA 71 07/24/2015 1155   GFRAA >60 12/14/2015 0631   GFRAA 82 07/24/2015 1155    No results found for: SPEP, UPEP  Lab Results  Component Value Date   WBC 13.6 (H) 04/21/2016   NEUTROABS 10.7 (H) 04/21/2016   HGB 11.9 (L) 04/21/2016   HCT 35.9 (L) 04/21/2016   MCV 99.9 (H) 04/21/2016   PLT 179 04/21/2016      Chemistry      Component Value Date/Time   NA 141 04/21/2016 1131   K 4.0 04/21/2016 1131   CL 106 12/14/2015 0631   CO2 26 04/21/2016 1131   BUN 21.3 04/21/2016 1131   CREATININE 1.0 04/21/2016 1131      Component Value Date/Time   CALCIUM 9.5 04/21/2016 1131   ALKPHOS 50 04/21/2016 1131   AST 17 04/21/2016 1131   ALT 31 04/21/2016 1131   BILITOT 0.56 04/21/2016 1131      ASSESSMENT & PLAN:   Multiple myeloma in remission ([place]) I review all test results with the patient. Overall, he is responding to treatment but I do not believe he is ready to proceed with stem cell transplant yet. In the meantime, he will complete cycle 3 of therapy this week as scheduled I anticipate he will need at least 2 more cycles of treatment before his free light chain levels are low enough to proceed with stem cell transplant. I will continue to see him on a monthly basis for test results review and supportive care The patient had recent dental extraction in June with adequate healing. I will proceed with Zometa at the end of the month, to be given every 3 months I discussed with him the risks and benefit of Zometa and he agreed to proceed He will continue calcium with vitamin D supplement  Peripheral neuropathy due to chemotherapy [facility]) The patient has neuropathy from prior chemotherapy with Velcade. He was started on a trial of gabapentin without significant benefit. I recommend increasing the dose of gabapentin to 600 mg 3 times a day and reassess next month. I want him risk of sedation and constipation  Cancer associated pain The patient is adamant that he does not want to take narcotic therapy. He is currently taking tramadol as needed. We'll continue the same He is on calcium and vitamin D supplement He also takes gabapentin for neuropathic pain  Left leg pain The patient had bilateral calf pain worse on the left. Examination show minimum swelling. Recent ultrasound venous Doppler excluded DVT. I suspect could be due to neuropathic pain. I recommend observation only   Dyspnea I suspect this is multifactorial, related to deconditioning. Examination show no signs of congestive heart failure and his cardiovascular examination is within normal limits. I recommend graduated exercise as tolerated  Blepharoconjunctivitis of both eyes He has significant visual problems and discomfort on both  eyes. His ophthalmologist is planning to perform by surgery next week. There is no contraindication for him to proceed and he can stop aspirin perioperatively   No orders of the defined types were placed in this encounter.  All questions were answered. The patient knows to call the clinic with any problems, questions or concerns. No barriers  to learning was detected. I spent 25 minutes counseling the patient face to face. The total time spent in the appointment was 30 minutes and more than 50% was on counseling and review of test results     [facility], [place], MD 04/21/2016 2:21 PM

## 2016-04-21 NOTE — Assessment & Plan Note (Signed)
I suspect this is multifactorial, related to deconditioning. Examination show no signs of congestive heart failure and his cardiovascular examination is within normal limits. I recommend graduated exercise as tolerated

## 2016-04-21 NOTE — Patient Instructions (Signed)
[facility] Discharge Instructions for Patients Receiving Chemotherapy  Today you received the following chemotherapy agents Cytoxan and Kyprolis  To help prevent nausea and vomiting after your treatment, we encourage you to take your nausea medication as directed. No Zofran for 3 days.   Take Compazine instead. If you develop nausea and vomiting that is not controlled by your nausea medication, call the clinic.   BELOW ARE SYMPTOMS THAT SHOULD BE REPORTED IMMEDIATELY:  *FEVER GREATER THAN 100.5 F  *CHILLS WITH OR WITHOUT FEVER  NAUSEA AND VOMITING THAT IS NOT CONTROLLED WITH YOUR NAUSEA MEDICATION  *UNUSUAL SHORTNESS OF BREATH  *UNUSUAL BRUISING OR BLEEDING  TENDERNESS IN MOUTH AND THROAT WITH OR WITHOUT PRESENCE OF ULCERS  *URINARY PROBLEMS  *BOWEL PROBLEMS  UNUSUAL RASH Items with * indicate a potential emergency and should be followed up as soon as possible.  Feel free to call the clinic you have any questions or concerns. The clinic phone number is [phone].  Please show the [place] at check-in to the Emergency Department and triage nurse.

## 2016-04-21 NOTE — Assessment & Plan Note (Signed)
The patient is adamant that he does not want to take narcotic therapy. He is currently taking tramadol as needed. We'll continue the same He is on calcium and vitamin D supplement He also takes gabapentin for neuropathic pain

## 2016-04-21 NOTE — Assessment & Plan Note (Signed)
The patient had bilateral calf pain worse on the left. Examination show minimum swelling. Recent ultrasound venous Doppler excluded DVT. I suspect could be due to neuropathic pain. I recommend observation only

## 2016-04-21 NOTE — Telephone Encounter (Signed)
Gave patient avs report and appointments for September and October  °

## 2016-04-21 NOTE — Assessment & Plan Note (Signed)
He has significant visual problems and discomfort on both eyes. His ophthalmologist is planning to perform by surgery next week. There is no contraindication for him to proceed and he can stop aspirin perioperatively

## 2016-04-21 NOTE — Assessment & Plan Note (Signed)
The patient has neuropathy from prior chemotherapy with Velcade. He was started on a trial of gabapentin without significant benefit. I recommend increasing the dose of gabapentin to 600 mg 3 times a day and reassess next month. I want him risk of sedation and constipation

## 2016-04-21 NOTE — Assessment & Plan Note (Addendum)
I review all test results with the patient. Overall, he is responding to treatment but I do not believe he is ready to proceed with stem cell transplant yet. In the meantime, he will complete cycle 3 of therapy this week as scheduled I anticipate he will need at least 2 more cycles of treatment before his free light chain levels are low enough to proceed with stem cell transplant. I will continue to see him on a monthly basis for test results review and supportive care The patient had recent dental extraction in June with adequate healing. I will proceed with Zometa at the end of the month, to be given every 3 months I discussed with him the risks and benefit of Zometa and he agreed to proceed He will continue calcium with vitamin D supplement

## 2016-04-22 ENCOUNTER — Ambulatory Visit (HOSPITAL_BASED_OUTPATIENT_CLINIC_OR_DEPARTMENT_OTHER): Payer: Medicaid Other

## 2016-04-22 VITALS — BP 139/97 | HR 86 | Temp 98.2°F | Resp 18

## 2016-04-22 DIAGNOSIS — Z5112 Encounter for antineoplastic immunotherapy: Secondary | ICD-10-CM | POA: Diagnosis not present

## 2016-04-22 DIAGNOSIS — C9001 Multiple myeloma in remission: Secondary | ICD-10-CM

## 2016-04-22 MED ORDER — CARFILZOMIB CHEMO INJECTION 60 MG
36.0000 mg/m2 | Freq: Once | INTRAVENOUS | Status: AC
  Administered 2016-04-22: 68 mg via INTRAVENOUS
  Filled 2016-04-22: qty 15

## 2016-04-22 MED ORDER — SODIUM CHLORIDE 0.9 % IV SOLN
Freq: Once | INTRAVENOUS | Status: AC
  Administered 2016-04-22: 13:00:00 via INTRAVENOUS

## 2016-04-22 MED ORDER — HEPARIN SOD (PORK) LOCK FLUSH 100 UNIT/ML IV SOLN
500.0000 [IU] | Freq: Once | INTRAVENOUS | Status: AC | PRN
  Administered 2016-04-22: 500 [IU]
  Filled 2016-04-22: qty 5

## 2016-04-22 MED ORDER — SODIUM CHLORIDE 0.9% FLUSH
10.0000 mL | INTRAVENOUS | Status: DC | PRN
Start: 2016-04-22 — End: 2016-04-22
  Administered 2016-04-22: 10 mL
  Filled 2016-04-22: qty 10

## 2016-04-22 MED ORDER — SODIUM CHLORIDE 0.9 % IV SOLN
10.0000 mg | Freq: Once | INTRAVENOUS | Status: AC
  Administered 2016-04-22: 10 mg via INTRAVENOUS
  Filled 2016-04-22: qty 1

## 2016-04-22 NOTE — Patient Instructions (Signed)
[facility] for Patients Receiving Chemotherapy  Today you received the following chemotherapy agents: Kyprolis   To help prevent nausea and vomiting after your treatment, we encourage you to take your nausea medication as directed.    If you develop nausea and vomiting that is not controlled by your nausea medication, call the clinic.   BELOW ARE SYMPTOMS THAT SHOULD BE REPORTED IMMEDIATELY:  *FEVER GREATER THAN 100.5 F  *CHILLS WITH OR WITHOUT FEVER  NAUSEA AND VOMITING THAT IS NOT CONTROLLED WITH YOUR NAUSEA MEDICATION  *UNUSUAL SHORTNESS OF BREATH  *UNUSUAL BRUISING OR BLEEDING  TENDERNESS IN MOUTH AND THROAT WITH OR WITHOUT PRESENCE OF ULCERS  *URINARY PROBLEMS  *BOWEL PROBLEMS  UNUSUAL RASH Items with * indicate a potential emergency and should be followed up as soon as possible.  Feel free to call the clinic you have any questions or concerns. The clinic phone number is [phone].  Please show the CHEMO ALERT CARD at check-in to the Emergency Department and triage nurse.   

## 2016-04-29 ENCOUNTER — Other Ambulatory Visit: Payer: Self-pay | Admitting: Ophthalmology

## 2016-05-05 ENCOUNTER — Ambulatory Visit: Payer: Medicaid Other

## 2016-05-05 ENCOUNTER — Other Ambulatory Visit (HOSPITAL_BASED_OUTPATIENT_CLINIC_OR_DEPARTMENT_OTHER): Payer: Medicaid Other

## 2016-05-05 ENCOUNTER — Ambulatory Visit (HOSPITAL_BASED_OUTPATIENT_CLINIC_OR_DEPARTMENT_OTHER): Payer: Medicaid Other

## 2016-05-05 VITALS — BP 125/79 | HR 64 | Temp 98.2°F | Resp 17

## 2016-05-05 DIAGNOSIS — Z5111 Encounter for antineoplastic chemotherapy: Secondary | ICD-10-CM | POA: Diagnosis not present

## 2016-05-05 DIAGNOSIS — C9001 Multiple myeloma in remission: Secondary | ICD-10-CM

## 2016-05-05 DIAGNOSIS — Z5112 Encounter for antineoplastic immunotherapy: Secondary | ICD-10-CM | POA: Diagnosis present

## 2016-05-05 DIAGNOSIS — Z95828 Presence of other vascular implants and grafts: Secondary | ICD-10-CM

## 2016-05-05 LAB — CBC WITH DIFFERENTIAL/PLATELET
BASO%: 0.4 % (ref 0.0–2.0)
BASOS ABS: 0 10*3/uL (ref 0.0–0.1)
EOS ABS: 0.2 10*3/uL (ref 0.0–0.5)
EOS%: 2.3 % (ref 0.0–7.0)
HCT: 35.4 % — ABNORMAL LOW (ref 38.4–49.9)
HEMOGLOBIN: 12.1 g/dL — AB (ref 13.0–17.1)
LYMPH%: 22.8 % (ref 14.0–49.0)
MCH: 34.4 pg — AB (ref 27.2–33.4)
MCHC: 34.2 g/dL (ref 32.0–36.0)
MCV: 100.6 fL — AB (ref 79.3–98.0)
MONO#: 1 10*3/uL — ABNORMAL HIGH (ref 0.1–0.9)
MONO%: 12.5 % (ref 0.0–14.0)
NEUT%: 62 % (ref 39.0–75.0)
NEUTROS ABS: 4.9 10*3/uL (ref 1.5–6.5)
Platelets: 313 10*3/uL (ref 140–400)
RBC: 3.52 10*6/uL — AB (ref 4.20–5.82)
RDW: 14.6 % (ref 11.0–14.6)
WBC: 7.8 10*3/uL (ref 4.0–10.3)
lymph#: 1.8 10*3/uL (ref 0.9–3.3)

## 2016-05-05 LAB — COMPREHENSIVE METABOLIC PANEL
ALT: 30 U/L (ref 0–55)
AST: 17 U/L (ref 5–34)
Albumin: 3.6 g/dL (ref 3.5–5.0)
Alkaline Phosphatase: 57 U/L (ref 40–150)
Anion Gap: 11 mEq/L (ref 3–11)
BUN: 14.3 mg/dL (ref 7.0–26.0)
CO2: 29 meq/L (ref 22–29)
Calcium: 10 mg/dL (ref 8.4–10.4)
Chloride: 106 mEq/L (ref 98–109)
Creatinine: 1.1 mg/dL (ref 0.7–1.3)
EGFR: 81 mL/min/{1.73_m2} — AB (ref >90)
GLUCOSE: 107 mg/dL (ref 70–140)
POTASSIUM: 3.8 meq/L (ref 3.5–5.1)
SODIUM: 145 meq/L (ref 136–145)
Total Bilirubin: 0.48 mg/dL (ref 0.20–1.20)
Total Protein: 6.4 g/dL (ref 6.4–8.3)

## 2016-05-05 MED ORDER — SODIUM CHLORIDE 0.9 % IV SOLN
300.0000 mg/m2 | Freq: Once | INTRAVENOUS | Status: AC
  Administered 2016-05-05: 560 mg via INTRAVENOUS
  Filled 2016-05-05: qty 28

## 2016-05-05 MED ORDER — PALONOSETRON HCL INJECTION 0.25 MG/5ML
INTRAVENOUS | Status: AC
  Filled 2016-05-05: qty 5

## 2016-05-05 MED ORDER — SODIUM CHLORIDE 0.9% FLUSH
10.0000 mL | INTRAVENOUS | Status: DC | PRN
  Administered 2016-05-05: 10 mL
  Filled 2016-05-05: qty 10

## 2016-05-05 MED ORDER — PALONOSETRON HCL INJECTION 0.25 MG/5ML
0.2500 mg | Freq: Once | INTRAVENOUS | Status: AC
  Administered 2016-05-05: 0.25 mg via INTRAVENOUS

## 2016-05-05 MED ORDER — HEPARIN SOD (PORK) LOCK FLUSH 100 UNIT/ML IV SOLN
500.0000 [IU] | Freq: Once | INTRAVENOUS | Status: AC | PRN
  Administered 2016-05-05: 500 [IU]
  Filled 2016-05-05: qty 5

## 2016-05-05 MED ORDER — SODIUM CHLORIDE 0.9 % IJ SOLN
10.0000 mL | INTRAMUSCULAR | Status: DC | PRN
  Administered 2016-05-05: 10 mL via INTRAVENOUS
  Filled 2016-05-05: qty 10

## 2016-05-05 MED ORDER — DEXAMETHASONE SODIUM PHOSPHATE 100 MG/10ML IJ SOLN
10.0000 mg | Freq: Once | INTRAMUSCULAR | Status: AC
  Administered 2016-05-05: 10 mg via INTRAVENOUS
  Filled 2016-05-05: qty 1

## 2016-05-05 MED ORDER — DEXTROSE 5 % IV SOLN
36.0000 mg/m2 | Freq: Once | INTRAVENOUS | Status: AC
  Administered 2016-05-05: 68 mg via INTRAVENOUS
  Filled 2016-05-05: qty 4

## 2016-05-05 MED ORDER — SODIUM CHLORIDE 0.9 % IV SOLN
Freq: Once | INTRAVENOUS | Status: AC
  Administered 2016-05-05: 12:00:00 via INTRAVENOUS

## 2016-05-05 MED FILL — PANTOPRAZOLE SOD DR 40 MG T: 40 | 30 days supply | Qty: 30 | Fill #2

## 2016-05-06 ENCOUNTER — Ambulatory Visit (HOSPITAL_BASED_OUTPATIENT_CLINIC_OR_DEPARTMENT_OTHER): Payer: Medicaid Other

## 2016-05-06 VITALS — BP 124/82 | HR 79 | Temp 98.6°F | Resp 16

## 2016-05-06 DIAGNOSIS — C9001 Multiple myeloma in remission: Secondary | ICD-10-CM

## 2016-05-06 DIAGNOSIS — Z5112 Encounter for antineoplastic immunotherapy: Secondary | ICD-10-CM | POA: Diagnosis present

## 2016-05-06 MED ORDER — DEXTROSE 5 % IV SOLN
36.0000 mg/m2 | Freq: Once | INTRAVENOUS | Status: AC
  Administered 2016-05-06: 68 mg via INTRAVENOUS
  Filled 2016-05-06: qty 30

## 2016-05-06 MED ORDER — SODIUM CHLORIDE 0.9 % IV SOLN
Freq: Once | INTRAVENOUS | Status: AC
  Administered 2016-05-06: 13:00:00 via INTRAVENOUS

## 2016-05-06 MED ORDER — SODIUM CHLORIDE 0.9% FLUSH
10.0000 mL | INTRAVENOUS | Status: DC | PRN
  Administered 2016-05-06: 10 mL
  Filled 2016-05-06: qty 10

## 2016-05-06 MED ORDER — SODIUM CHLORIDE 0.9 % IV SOLN
10.0000 mg | Freq: Once | INTRAVENOUS | Status: AC
  Administered 2016-05-06: 10 mg via INTRAVENOUS
  Filled 2016-05-06: qty 1

## 2016-05-06 MED ORDER — SODIUM CHLORIDE 0.9 % IV SOLN
Freq: Once | INTRAVENOUS | Status: AC
Start: 2016-05-06 — End: 2016-05-06
  Administered 2016-05-06: 12:00:00 via INTRAVENOUS

## 2016-05-06 MED ORDER — HEPARIN SOD (PORK) LOCK FLUSH 100 UNIT/ML IV SOLN
500.0000 [IU] | Freq: Once | INTRAVENOUS | Status: AC | PRN
  Administered 2016-05-06: 500 [IU]
  Filled 2016-05-06: qty 5

## 2016-05-06 NOTE — Patient Instructions (Signed)
[place] Discharge Instructions for Patients Receiving Chemotherapy  Today you received the following chemotherapy agents, Kyprolis  To help prevent nausea and vomiting after your treatment, we encourage you to take your nausea medication as directed.    If you develop nausea and vomiting that is not controlled by your nausea medication, call the clinic.   BELOW ARE SYMPTOMS THAT SHOULD BE REPORTED IMMEDIATELY:  *FEVER GREATER THAN 100.5 F  *CHILLS WITH OR WITHOUT FEVER  NAUSEA AND VOMITING THAT IS NOT CONTROLLED WITH YOUR NAUSEA MEDICATION  *UNUSUAL SHORTNESS OF BREATH  *UNUSUAL BRUISING OR BLEEDING  TENDERNESS IN MOUTH AND THROAT WITH OR WITHOUT PRESENCE OF ULCERS  *URINARY PROBLEMS  *BOWEL PROBLEMS  UNUSUAL RASH Items with * indicate a potential emergency and should be followed up as soon as possible.  Feel free to call the clinic you have any questions or concerns. The clinic phone number is [phone].  Please show the [place] at check-in to the Emergency Department and triage nurse.

## 2016-05-12 ENCOUNTER — Other Ambulatory Visit (HOSPITAL_BASED_OUTPATIENT_CLINIC_OR_DEPARTMENT_OTHER): Payer: Medicaid Other

## 2016-05-12 ENCOUNTER — Ambulatory Visit (HOSPITAL_BASED_OUTPATIENT_CLINIC_OR_DEPARTMENT_OTHER): Payer: Medicaid Other

## 2016-05-12 ENCOUNTER — Ambulatory Visit: Payer: Medicaid Other

## 2016-05-12 VITALS — BP 115/86 | HR 79 | Temp 98.2°F | Resp 17

## 2016-05-12 DIAGNOSIS — Z8042 Family history of malignant neoplasm of prostate: Secondary | ICD-10-CM

## 2016-05-12 DIAGNOSIS — Z5111 Encounter for antineoplastic chemotherapy: Secondary | ICD-10-CM

## 2016-05-12 DIAGNOSIS — C9001 Multiple myeloma in remission: Secondary | ICD-10-CM

## 2016-05-12 DIAGNOSIS — Z5112 Encounter for antineoplastic immunotherapy: Secondary | ICD-10-CM

## 2016-05-12 DIAGNOSIS — T451X5A Adverse effect of antineoplastic and immunosuppressive drugs, initial encounter: Secondary | ICD-10-CM

## 2016-05-12 DIAGNOSIS — M898X9 Other specified disorders of bone, unspecified site: Secondary | ICD-10-CM

## 2016-05-12 DIAGNOSIS — Z95828 Presence of other vascular implants and grafts: Secondary | ICD-10-CM

## 2016-05-12 DIAGNOSIS — G893 Neoplasm related pain (acute) (chronic): Secondary | ICD-10-CM

## 2016-05-12 DIAGNOSIS — G620 Drug-induced polyneuropathy: Secondary | ICD-10-CM

## 2016-05-12 DIAGNOSIS — D61810 Antineoplastic chemotherapy induced pancytopenia: Secondary | ICD-10-CM

## 2016-05-12 LAB — COMPREHENSIVE METABOLIC PANEL
ALBUMIN: 3.5 g/dL (ref 3.5–5.0)
ALK PHOS: 54 U/L (ref 40–150)
ALT: 27 U/L (ref 0–55)
ANION GAP: 10 meq/L (ref 3–11)
AST: 14 U/L (ref 5–34)
BILIRUBIN TOTAL: 0.66 mg/dL (ref 0.20–1.20)
BUN: 18 mg/dL (ref 7.0–26.0)
CO2: 25 mEq/L (ref 22–29)
Calcium: 9.3 mg/dL (ref 8.4–10.4)
Chloride: 106 mEq/L (ref 98–109)
Creatinine: 1.1 mg/dL (ref 0.7–1.3)
EGFR: 84 mL/min/{1.73_m2} — AB (ref >90)
Glucose: 82 mg/dl (ref 70–140)
Potassium: 3.9 mEq/L (ref 3.5–5.1)
Sodium: 140 mEq/L (ref 136–145)
TOTAL PROTEIN: 6.5 g/dL (ref 6.4–8.3)

## 2016-05-12 LAB — CBC WITH DIFFERENTIAL/PLATELET
BASO%: 0.4 % (ref 0.0–2.0)
Basophils Absolute: 0 10*3/uL (ref 0.0–0.1)
EOS ABS: 0.1 10*3/uL (ref 0.0–0.5)
EOS%: 1.2 % (ref 0.0–7.0)
HCT: 37.3 % — ABNORMAL LOW (ref 38.4–49.9)
HEMOGLOBIN: 12.6 g/dL — AB (ref 13.0–17.1)
LYMPH#: 1.9 10*3/uL (ref 0.9–3.3)
LYMPH%: 21.8 % (ref 14.0–49.0)
MCH: 34.5 pg — ABNORMAL HIGH (ref 27.2–33.4)
MCHC: 33.8 g/dL (ref 32.0–36.0)
MCV: 102 fL — ABNORMAL HIGH (ref 79.3–98.0)
MONO#: 1.2 10*3/uL — ABNORMAL HIGH (ref 0.1–0.9)
MONO%: 13.7 % (ref 0.0–14.0)
NEUT%: 62.9 % (ref 39.0–75.0)
NEUTROS ABS: 5.3 10*3/uL (ref 1.5–6.5)
PLATELETS: 176 10*3/uL (ref 140–400)
RBC: 3.66 10*6/uL — AB (ref 4.20–5.82)
RDW: 14 % (ref 11.0–14.6)
WBC: 8.5 10*3/uL (ref 4.0–10.3)

## 2016-05-12 MED ORDER — SODIUM CHLORIDE 0.9 % IV SOLN
Freq: Once | INTRAVENOUS | Status: AC
  Administered 2016-05-12: 14:00:00 via INTRAVENOUS

## 2016-05-12 MED ORDER — SODIUM CHLORIDE 0.9% FLUSH
10.0000 mL | INTRAVENOUS | Status: DC | PRN
  Administered 2016-05-12: 10 mL
  Filled 2016-05-12: qty 10

## 2016-05-12 MED ORDER — LIDOCAINE-PRILOCAINE 2.5-2.5 % EX CREA: TOPICAL_CREAM | CUTANEOUS | 3 refills | Status: DC

## 2016-05-12 MED ORDER — PALONOSETRON HCL INJECTION 0.25 MG/5ML
0.2500 mg | Freq: Once | INTRAVENOUS | Status: AC
  Administered 2016-05-12: 0.25 mg via INTRAVENOUS

## 2016-05-12 MED ORDER — SODIUM CHLORIDE 0.9 % IV SOLN
10.0000 mg | Freq: Once | INTRAVENOUS | Status: AC
  Administered 2016-05-12: 10 mg via INTRAVENOUS
  Filled 2016-05-12: qty 1

## 2016-05-12 MED ORDER — SODIUM CHLORIDE 0.9 % IV SOLN
300.0000 mg/m2 | Freq: Once | INTRAVENOUS | Status: AC
  Administered 2016-05-12: 560 mg via INTRAVENOUS
  Filled 2016-05-12: qty 28

## 2016-05-12 MED ORDER — PALONOSETRON HCL INJECTION 0.25 MG/5ML
INTRAVENOUS | Status: AC
  Filled 2016-05-12: qty 5

## 2016-05-12 MED ORDER — DEXTROSE 5 % IV SOLN
36.0000 mg/m2 | Freq: Once | INTRAVENOUS | Status: AC
  Administered 2016-05-12: 68 mg via INTRAVENOUS
  Filled 2016-05-12: qty 30

## 2016-05-12 MED ORDER — TRAMADOL HCL 50 MG PO TABS: 50.0000 mg | ORAL_TABLET | Freq: Four times a day (QID) | ORAL | 0 refills | Status: DC | PRN

## 2016-05-12 MED ORDER — ZOLEDRONIC ACID 4 MG/100ML IV SOLN
4.0000 mg | Freq: Once | INTRAVENOUS | Status: AC
  Administered 2016-05-12: 4 mg via INTRAVENOUS
  Filled 2016-05-12: qty 100

## 2016-05-12 MED ORDER — SODIUM CHLORIDE 0.9 % IJ SOLN
10.0000 mL | INTRAMUSCULAR | Status: DC | PRN
  Administered 2016-05-12: 10 mL via INTRAVENOUS
  Filled 2016-05-12: qty 10

## 2016-05-12 MED ORDER — HEPARIN SOD (PORK) LOCK FLUSH 100 UNIT/ML IV SOLN
500.0000 [IU] | Freq: Once | INTRAVENOUS | Status: AC | PRN
  Administered 2016-05-12: 500 [IU]
  Filled 2016-05-12: qty 5

## 2016-05-12 MED FILL — ZOLPIDEM TARTRATE 10 MG TAB: 10 | 30 days supply | Qty: 30 | Fill #3

## 2016-05-12 MED FILL — LIDOCAINE-PRILOCAINE CREAM: 2.5-2.5 | 10 days supply | Qty: 30 | Fill #0

## 2016-05-12 MED FILL — traMADol HCL 50 MG TABS: 50 | 11 days supply | Qty: 90 | Fill #0

## 2016-05-12 NOTE — Patient Instructions (Signed)

## 2016-05-12 NOTE — Patient Instructions (Signed)
Zoledronic Acid injection (Hypercalcemia, Oncology)  What is this medicine?  ZOLEDRONIC ACID (ZOE le dron ik AS id) lowers the amount of calcium loss from bone. It is used to treat too much calcium in your blood from cancer. It is also used to prevent complications of cancer that has spread to the bone.  This medicine may be used for other purposes; ask your health care provider or pharmacist if you have questions.  What should I tell my health care provider before I take this medicine?  They need to know if you have any of these conditions:  -aspirin-sensitive asthma  -cancer, especially if you are receiving medicines used to treat cancer  -dental disease or wear dentures  -infection  -kidney disease  -receiving corticosteroids like dexamethasone or prednisone  -an unusual or allergic reaction to zoledronic acid, other medicines, foods, dyes, or preservatives  -pregnant or trying to get pregnant  -breast-feeding  How should I use this medicine?  This medicine is for infusion into a vein. It is given by a health care professional in a hospital or clinic setting.  Talk to your pediatrician regarding the use of this medicine in children. Special care may be needed.  Overdosage: If you think you have taken too much of this medicine contact a poison control center or emergency room at once.  NOTE: This medicine is only for you. Do not share this medicine with others.  What if I miss a dose?  It is important not to miss your dose. Call your doctor or health care professional if you are unable to keep an appointment.  What may interact with this medicine?  -certain antibiotics given by injection  -NSAIDs, medicines for pain and inflammation, like ibuprofen or naproxen  -some diuretics like bumetanide, furosemide  -teriparatide  -thalidomide  This list may not describe all possible interactions. Give your health care provider a list of all the medicines, herbs, non-prescription drugs, or dietary supplements you use. Also  tell them if you smoke, drink alcohol, or use illegal drugs. Some items may interact with your medicine.  What should I watch for while using this medicine?  Visit your doctor or health care professional for regular checkups. It may be some time before you see the benefit from this medicine. Do not stop taking your medicine unless your doctor tells you to. Your doctor may order blood tests or other tests to see how you are doing.  Women should inform their doctor if they wish to become pregnant or think they might be pregnant. There is a potential for serious side effects to an unborn child. Talk to your health care professional or pharmacist for more information.  You should make sure that you get enough calcium and vitamin D while you are taking this medicine. Discuss the foods you eat and the vitamins you take with your health care professional.  Some people who take this medicine have severe bone, joint, and/or muscle pain. This medicine may also increase your risk for jaw problems or a broken thigh bone. Tell your doctor right away if you have severe pain in your jaw, bones, joints, or muscles. Tell your doctor if you have any pain that does not go away or that gets worse.  Tell your dentist and dental surgeon that you are taking this medicine. You should not have major dental surgery while on this medicine. See your dentist to have a dental exam and fix any dental problems before starting this medicine. Take good care   your doctor or health care professional as soon as possible: -allergic reactions like skin rash, itching or hives, swelling of the face, lips, or tongue -anxiety, confusion, or depression -breathing problems -changes in vision -eye pain -feeling faint or lightheaded, falls -jaw pain,  especially after dental work -mouth sores -muscle cramps, stiffness, or weakness -redness, blistering, peeling or loosening of the skin, including inside the mouth -trouble passing urine or change in the amount of urine Side effects that usually do not require medical attention (report to your doctor or health care professional if they continue or are bothersome): -bone, joint, or muscle pain -constipation -diarrhea -fever -hair loss -irritation at site where injected -loss of appetite -nausea, vomiting -stomach upset -trouble sleeping -trouble swallowing -weak or tired This list may not describe all possible side effects. Call your doctor for medical advice about side effects. You may report side effects to FDA at 1-800-FDA-1088. Where should I keep my medicine? This drug is given in a hospital or clinic and will not be stored at home. NOTE: This sheet is a summary. It may not cover all possible information. If you have questions about this medicine, talk to your doctor, pharmacist, or health care provider.    2016, Elsevier/Gold Standard. (2013-12-28 14:19:39)  [facility] Discharge Instructions for Patients Receiving Chemotherapy  Today you received the following chemotherapy agents Kyprolis and Cytoxan To help prevent nausea and vomiting after your treatment, we encourage you to take your nausea medication as directed but NO ZOFRAN FOR 3 DAYS.  If you develop nausea and vomiting that is not controlled by your nausea medication, call the clinic.   BELOW ARE SYMPTOMS THAT SHOULD BE REPORTED IMMEDIATELY:  *FEVER GREATER THAN 100.5 F  *CHILLS WITH OR WITHOUT FEVER  NAUSEA AND VOMITING THAT IS NOT CONTROLLED WITH YOUR NAUSEA MEDICATION  *UNUSUAL SHORTNESS OF BREATH  *UNUSUAL BRUISING OR BLEEDING  TENDERNESS IN MOUTH AND THROAT WITH OR WITHOUT PRESENCE OF ULCERS  *URINARY PROBLEMS  *BOWEL PROBLEMS  UNUSUAL RASH Items with * indicate a potential  emergency and should be followed up as soon as possible.  Feel free to call the clinic you have any questions or concerns. The clinic phone number is [phone].  Please show the [place] at check-in to the Emergency Department and triage nurse.

## 2016-05-13 ENCOUNTER — Ambulatory Visit (HOSPITAL_BASED_OUTPATIENT_CLINIC_OR_DEPARTMENT_OTHER): Payer: Medicaid Other

## 2016-05-13 VITALS — BP 140/93 | HR 70 | Temp 98.2°F | Resp 18

## 2016-05-13 DIAGNOSIS — C9001 Multiple myeloma in remission: Secondary | ICD-10-CM

## 2016-05-13 DIAGNOSIS — Z5112 Encounter for antineoplastic immunotherapy: Secondary | ICD-10-CM

## 2016-05-13 LAB — KAPPA/LAMBDA LIGHT CHAINS
Ig Kappa Free Light Chain: 5.1 mg/L (ref 3.3–19.4)
Ig Lambda Free Light Chain: 151 mg/L — ABNORMAL HIGH (ref 5.7–26.3)
Kappa/Lambda FluidC Ratio: 0.03 — ABNORMAL LOW (ref 0.26–1.65)

## 2016-05-13 MED ORDER — SODIUM CHLORIDE 0.9% FLUSH
10.0000 mL | INTRAVENOUS | Status: DC | PRN
  Administered 2016-05-13: 10 mL
  Filled 2016-05-13: qty 10

## 2016-05-13 MED ORDER — SODIUM CHLORIDE 0.9 % IV SOLN
10.0000 mg | Freq: Once | INTRAVENOUS | Status: AC
  Administered 2016-05-13: 10 mg via INTRAVENOUS
  Filled 2016-05-13: qty 1

## 2016-05-13 MED ORDER — SODIUM CHLORIDE 0.9 % IV SOLN
Freq: Once | INTRAVENOUS | Status: AC
  Administered 2016-05-13: 14:00:00 via INTRAVENOUS

## 2016-05-13 MED ORDER — SODIUM CHLORIDE 0.9 % IV SOLN
Freq: Once | INTRAVENOUS | Status: AC
  Administered 2016-05-13: 15:00:00 via INTRAVENOUS

## 2016-05-13 MED ORDER — HEPARIN SOD (PORK) LOCK FLUSH 100 UNIT/ML IV SOLN
500.0000 [IU] | Freq: Once | INTRAVENOUS | Status: AC | PRN
  Administered 2016-05-13: 500 [IU]
  Filled 2016-05-13: qty 5

## 2016-05-13 MED ORDER — DEXTROSE 5 % IV SOLN
36.0000 mg/m2 | Freq: Once | INTRAVENOUS | Status: AC
  Administered 2016-05-13: 68 mg via INTRAVENOUS
  Filled 2016-05-13: qty 4

## 2016-05-13 NOTE — Patient Instructions (Signed)
[facility] Discharge Instructions for Patients Receiving Chemotherapy  Today you received the following chemotherapy agents: Kyprolis   To help prevent nausea and vomiting after your treatment, we encourage you to take your nausea medication as directed.    If you develop nausea and vomiting that is not controlled by your nausea medication, call the clinic.   BELOW ARE SYMPTOMS THAT SHOULD BE REPORTED IMMEDIATELY:  *FEVER GREATER THAN 100.5 F  *CHILLS WITH OR WITHOUT FEVER  NAUSEA AND VOMITING THAT IS NOT CONTROLLED WITH YOUR NAUSEA MEDICATION  *UNUSUAL SHORTNESS OF BREATH  *UNUSUAL BRUISING OR BLEEDING  TENDERNESS IN MOUTH AND THROAT WITH OR WITHOUT PRESENCE OF ULCERS  *URINARY PROBLEMS  *BOWEL PROBLEMS  UNUSUAL RASH Items with * indicate a potential emergency and should be followed up as soon as possible.  Feel free to call the clinic you have any questions or concerns. The clinic phone number is [phone].  Please show the CHEMO ALERT CARD at check-in to the Emergency Department and triage nurse.   

## 2016-05-16 LAB — MULTIPLE MYELOMA PANEL, SERUM
ALBUMIN SERPL ELPH-MCNC: 3.6 g/dL (ref 2.9–4.4)
ALPHA2 GLOB SERPL ELPH-MCNC: 0.6 g/dL (ref 0.4–1.0)
Albumin/Glob SerPl: 1.6 (ref 0.7–1.7)
Alpha 1: 0.2 g/dL (ref 0.0–0.4)
B-GLOBULIN SERPL ELPH-MCNC: 1 g/dL (ref 0.7–1.3)
Gamma Glob SerPl Elph-Mcnc: 0.5 g/dL (ref 0.4–1.8)
Globulin, Total: 2.3 g/dL (ref 2.2–3.9)
IGG (IMMUNOGLOBIN G), SERUM: 493 mg/dL — AB (ref 700–1600)
IgA, Qn, Serum: 40 mg/dL — ABNORMAL LOW (ref 90–386)
IgM, Qn, Serum: <5 mg/dL — ABNORMAL LOW (ref 20–172)
TOTAL PROTEIN: 5.9 g/dL — AB (ref 6.0–8.5)

## 2016-05-16 MED FILL — ACYCLOVIR 400 MG TABLET: 400 | 30 days supply | Qty: 60 | Fill #1

## 2016-05-18 ENCOUNTER — Encounter: Payer: Self-pay | Admitting: Emergency Medicine

## 2016-05-18 ENCOUNTER — Emergency Department: Payer: Medicaid Other

## 2016-05-18 ENCOUNTER — Other Ambulatory Visit: Payer: Self-pay

## 2016-05-18 ENCOUNTER — Observation Stay
Admission: EM | Admit: 2016-05-18 | Discharge: 2016-05-19 | Disposition: A | Discharge status: Home / Self Care | Payer: Medicaid Other | Attending: Internal Medicine | Admitting: Internal Medicine

## 2016-05-18 DIAGNOSIS — K210 Gastro-esophageal reflux disease with esophagitis: Secondary | ICD-10-CM | POA: Diagnosis not present

## 2016-05-18 DIAGNOSIS — Z79899 Other long term (current) drug therapy: Secondary | ICD-10-CM | POA: Insufficient documentation

## 2016-05-18 DIAGNOSIS — Z87891 Personal history of nicotine dependence: Secondary | ICD-10-CM | POA: Diagnosis not present

## 2016-05-18 DIAGNOSIS — Z7982 Long term (current) use of aspirin: Secondary | ICD-10-CM | POA: Diagnosis not present

## 2016-05-18 DIAGNOSIS — R079 Chest pain, unspecified: Principal | ICD-10-CM | POA: Insufficient documentation

## 2016-05-18 DIAGNOSIS — C9002 Multiple myeloma in relapse: Secondary | ICD-10-CM | POA: Diagnosis present

## 2016-05-18 DIAGNOSIS — T451X5A Adverse effect of antineoplastic and immunosuppressive drugs, initial encounter: Secondary | ICD-10-CM | POA: Diagnosis not present

## 2016-05-18 DIAGNOSIS — G620 Drug-induced polyneuropathy: Secondary | ICD-10-CM | POA: Insufficient documentation

## 2016-05-18 DIAGNOSIS — I10 Essential (primary) hypertension: Secondary | ICD-10-CM | POA: Diagnosis not present

## 2016-05-18 DIAGNOSIS — C9000 Multiple myeloma not having achieved remission: Secondary | ICD-10-CM | POA: Insufficient documentation

## 2016-05-18 DIAGNOSIS — K219 Gastro-esophageal reflux disease without esophagitis: Secondary | ICD-10-CM | POA: Diagnosis present

## 2016-05-18 LAB — URINALYSIS COMPLETE WITH MICROSCOPIC (ARMC ONLY)
Bacteria, UA: NONE SEEN
Bilirubin Urine: NEGATIVE
Glucose, UA: NEGATIVE mg/dL
Hgb urine dipstick: NEGATIVE
KETONES UR: NEGATIVE mg/dL
LEUKOCYTES UA: NEGATIVE
NITRITE: NEGATIVE
PH: 7 (ref 5.0–8.0)
PROTEIN: NEGATIVE mg/dL
RBC / HPF: NONE SEEN RBC/hpf (ref 0–5)
SPECIFIC GRAVITY, URINE: 1.005 (ref 1.005–1.030)
Squamous Epithelial / LPF: NONE SEEN

## 2016-05-18 LAB — CBC WITH DIFFERENTIAL/PLATELET
BASOS PCT: 0 %
Basophils Absolute: 0 10*3/uL (ref 0–0.1)
Eosinophils Absolute: 0.1 10*3/uL (ref 0–0.7)
Eosinophils Relative: 1 %
HEMATOCRIT: 35.1 % — AB (ref 40.0–52.0)
HEMOGLOBIN: 11.9 g/dL — AB (ref 13.0–18.0)
LYMPHS ABS: 1.5 10*3/uL (ref 1.0–3.6)
LYMPHS PCT: 12 %
MCH: 35 pg — ABNORMAL HIGH (ref 26.0–34.0)
MCHC: 34.1 g/dL (ref 32.0–36.0)
MCV: 102.8 fL — AB (ref 80.0–100.0)
MONO ABS: 1.2 10*3/uL — AB (ref 0.2–1.0)
MONOS PCT: 10 %
NEUTROS ABS: 9.2 10*3/uL — AB (ref 1.4–6.5)
NEUTROS PCT: 77 %
Platelets: 158 10*3/uL (ref 150–440)
RBC: 3.41 MIL/uL — ABNORMAL LOW (ref 4.40–5.90)
RDW: 14 % (ref 11.5–14.5)
WBC: 12 10*3/uL — ABNORMAL HIGH (ref 3.8–10.6)

## 2016-05-18 LAB — COMPREHENSIVE METABOLIC PANEL
ALBUMIN: 3.7 g/dL (ref 3.5–5.0)
ALK PHOS: 43 U/L (ref 38–126)
ALT: 25 U/L (ref 17–63)
ANION GAP: 7 (ref 5–15)
AST: 20 U/L (ref 15–41)
BILIRUBIN TOTAL: 0.6 mg/dL (ref 0.3–1.2)
BUN: 10 mg/dL (ref 6–20)
CALCIUM: 9 mg/dL (ref 8.9–10.3)
CO2: 29 mmol/L (ref 22–32)
Chloride: 102 mmol/L (ref 101–111)
Creatinine, Ser: 1.08 mg/dL (ref 0.61–1.24)
GFR calc Af Amer: >60 mL/min (ref >60)
GLUCOSE: 102 mg/dL — AB (ref 65–99)
Potassium: 3.5 mmol/L (ref 3.5–5.1)
Sodium: 138 mmol/L (ref 135–145)
TOTAL PROTEIN: 6.2 g/dL — AB (ref 6.5–8.1)

## 2016-05-18 LAB — LIPASE, BLOOD: Lipase: 20 U/L (ref 11–51)

## 2016-05-18 LAB — TROPONIN I

## 2016-05-18 MED ORDER — SODIUM CHLORIDE 0.9 % IV BOLUS (SEPSIS)
1000.0000 mL | Freq: Once | INTRAVENOUS | Status: AC
  Administered 2016-05-18: 1000 mL via INTRAVENOUS

## 2016-05-18 MED ORDER — ONDANSETRON HCL 4 MG/2ML IJ SOLN
4.0000 mg | Freq: Once | INTRAMUSCULAR | Status: AC
  Administered 2016-05-18: 4 mg via INTRAVENOUS

## 2016-05-18 MED ORDER — LABETALOL HCL 5 MG/ML IV SOLN
10.0000 mg | INTRAVENOUS | Status: DC | PRN
  Administered 2016-05-18: 10 mg via INTRAVENOUS
  Filled 2016-05-18: qty 4

## 2016-05-18 MED ORDER — MORPHINE SULFATE (PF) 4 MG/ML IV SOLN
INTRAVENOUS | Status: AC
  Administered 2016-05-18: 4 mg via INTRAVENOUS
  Filled 2016-05-18: qty 1

## 2016-05-18 MED ORDER — PENTAFLUOROPROP-TETRAFLUOROETH EX AERO
INHALATION_SPRAY | CUTANEOUS | Status: AC
  Administered 2016-05-18: 30
  Filled 2016-05-18: qty 30

## 2016-05-18 MED ORDER — NITROGLYCERIN 0.4 MG SL SUBL
0.4000 mg | SUBLINGUAL_TABLET | SUBLINGUAL | Status: DC | PRN
  Administered 2016-05-18 – 2016-05-19 (×2): 0.4 mg via SUBLINGUAL
  Filled 2016-05-18: qty 1

## 2016-05-18 MED ORDER — ONDANSETRON HCL 4 MG/2ML IJ SOLN
INTRAMUSCULAR | Status: AC
  Administered 2016-05-18: 4 mg via INTRAVENOUS
  Filled 2016-05-18: qty 2

## 2016-05-18 MED ORDER — MORPHINE SULFATE (PF) 4 MG/ML IV SOLN
4.0000 mg | Freq: Once | INTRAVENOUS | Status: AC
  Administered 2016-05-18: 4 mg via INTRAVENOUS

## 2016-05-18 MED ORDER — NITROGLYCERIN 0.4 MG SL SUBL
SUBLINGUAL_TABLET | SUBLINGUAL | Status: AC
  Administered 2016-05-18: 0.4 mg via SUBLINGUAL
  Filled 2016-05-18: qty 1

## 2016-05-18 MED ORDER — IOPAMIDOL (ISOVUE-370) INJECTION 76%
125.0000 mL | Freq: Once | INTRAVENOUS | Status: AC | PRN
  Administered 2016-05-18: 125 mL via INTRAVENOUS

## 2016-05-18 NOTE — ED Provider Notes (Signed)
[facility] Emergency Department Provider Note  ____________________________________________  Time seen: Approximately 7:35 PM  I have reviewed the triage vital signs and the nursing notes.   HISTORY  Chief Complaint Chest Pain   HPI Erik Nguyen is a 60 y.o. male with a history of multiple myeloma currently undergoing chemotherapy on Thursdays and Fridays, hypertension and esophagitis who presents for evaluation of chest pain. Patient reports that yesterday evening after having dinner he developed epigastric abdominal pain radiating to his back and bilateral arms. He reports that the pain was sharp and severe. He took a sleeping pill and went to bed. This morning when he woke up he had the same pain. He had multiple normal bowel movements and the pain in his abdomen, back, and arms went away but he reports that he continued to have left sided chest pain. He reports that the pain is pressure in quality, 8/10, non radiating, and pleuritic. He denies cough, chills, fever. He took his BP which was high and took his temp which was 99.77F and called his oncologist who recommended that he call 911. Patient's last chemo was Friday. No known sick contacts. No personal or family h/o blood clots or ischemic heart disease, no recent travel or immobilization, no leg pain or swelling. Patient endorses SOB however this is chronic and not worse from baseline. Patient was given 1 sublingual nitro and full dose of ASA. He reports that the pain decreased to 5/10 after the nitro.  Past Medical History:  Diagnosis Date  . Encounter for antineoplastic chemotherapy 02/09/2016  . History of syphilis 02/24/2016  . Hypertension Dx 2016  . Multiple myeloma not having achieved remission ([place]) 09/01/2015  . Priapism 2014     Patient Active Problem List   Diagnosis Date Noted  . Chest pain 05/18/2016  . GERD (gastroesophageal reflux disease) 05/18/2016  . Blepharoconjunctivitis of both eyes  04/21/2016  . Dyspnea 03/25/2016  . Left leg pain 03/24/2016  . Port catheter in place 02/25/2016  . Pancytopenia due to antineoplastic chemotherapy ([place]) 02/25/2016  . History of syphilis 02/24/2016  . Encounter for antineoplastic chemotherapy 02/09/2016  . Skin rash 12/14/2015  . 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 Antineoplastic chemotherapy induced pancytopenia ([place])   . Bone pain   . Cancer associated pain 09/02/2015  . Multiple myeloma ([place]) 09/01/2015  . 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 Vitamin D deficiency 07/22/2015  . Closed right clavicular fracture 07/17/2015  . Chest wall pain 03/12/2015  . Thoracolumbar back pain 03/12/2015  . Allergic rhinitis 03/12/2015  . Poor dentition 01/15/2015  . Nerve damage 01/15/2015  . Esophagitis 12/28/2014  . Lower GI bleed   . Hemorrhoids 11/06/2014  . HTN (hypertension) 11/06/2014  . Priapism 11/06/2014  . Decreased visual acuity 11/06/2014  . Family history of prostate cancer in father 11/06/2014    Past Surgical History:  Procedure Laterality Date  . COLONOSCOPY    . POLYPECTOMY      Prior to Admission medications   Medication Sig Start Date End Date Taking? Authorizing Provider  acyclovir (ZOVIRAX) 400 MG tablet Take 1 tablet (400 mg total) by mouth 2 (two) times daily. 01/04/16  Yes Heath Lark, MD  amLODipine (NORVASC) 10 MG tablet Take 1 tablet (10 mg total) by mouth daily. 03/25/16  Yes Heath Lark, MD  aspirin EC 81 MG tablet Take 81 mg by mouth daily.   Yes Historical Provider, MD  bisacodyl (DULCOLAX) 5 MG EC tablet Take 5 mg  by mouth 2 (two) times daily as needed for moderate constipation.   Yes Historical Provider, MD  CALCIUM PO Take 1 tablet by mouth daily.   Yes Historical Provider, MD  cholecalciferol (VITAMIN D) 1000 units tablet Take 2,000 Units by mouth daily.   Yes Historical Provider, MD  dexamethasone (DECADRON) 4 MG tablet Take 5 pills every week on Thursdays with food Patient  taking differently: Take 5 pills every week on Fridays with food 02/09/16  Yes Heath Lark, MD  diclofenac sodium (VOLTAREN) 1 % GEL Apply 2 g topically 4 (four) times daily. Patient taking differently: Apply 2 g topically 4 (four) times daily as needed (pain).  10/26/15  Yes Heath Lark, MD  gabapentin (NEURONTIN) 300 MG capsule Take 2 capsules (600 mg total) by mouth 3 (three) times daily. Patient taking differently: Take 300 mg by mouth 3 (three) times daily.  04/21/16  Yes Heath Lark, MD  lidocaine-prilocaine (EMLA) cream Apply to affected area once 05/12/16  Yes Heath Lark, MD  lisinopril-hydrochlorothiazide (PRINZIDE,ZESTORETIC) 20-25 MG tablet Take 1 tablet by mouth daily. 03/28/16  Yes Dionne Bucy McClung, PA-C  pantoprazole (PROTONIX) 40 MG tablet Take 1 tablet (40 mg total) by mouth daily. 12/14/15  Yes Heath Lark, MD  polyethylene glycol (MIRALAX / GLYCOLAX) packet Take 17 g by mouth 2 (two) times daily. 09/16/15  Yes Eugenie Filler, MD  senna-docusate (SENOKOT-S) 8.6-50 MG tablet Take 1 tablet by mouth 2 (two) times daily. 10/12/15  Yes Heath Lark, MD  traMADol (ULTRAM) 50 MG tablet Take 1-2 tablets (50-100 mg total) by mouth every 6 (six) hours as needed. for pain 05/12/16  Yes Heath Lark, MD  zolpidem (AMBIEN) 10 MG tablet Take 1 tablet (10 mg total) by mouth at bedtime as needed for sleep. 04/21/16 05/21/16 Yes Heath Lark, MD    Allergies Benadryl [diphenhydramine]; Trazodone and nefazodone; and Latex  Family History  Problem Relation Age of Onset  . Asthma Mother   . Cancer Father   . Prostate cancer Father   . Colon cancer Neg Hx   . Rectal cancer Neg Hx   . Stomach cancer Neg Hx     Social History Social History  Substance Use Topics  . Smoking status: Former Smoker    Packs/day: 1.00    Types: Cigarettes  . Smokeless tobacco: Never Used  . Alcohol use No     Comment: last use Jan 2015    Review of Systems  Constitutional: Negative for fever. Eyes: Negative for visual  changes. ENT: Negative for sore throat. Cardiovascular: + chest pain. Respiratory: Negative for shortness of breath. Gastrointestinal: Negative for abdominal pain, vomiting or diarrhea. Genitourinary: Negative for dysuria. Musculoskeletal: Negative for back pain. Skin: Negative for rash. Neurological: Negative for headaches, weakness or numbness.  ____________________________________________   PHYSICAL EXAM:  VITAL SIGNS: Vitals:   05/18/16 2230 05/18/16 2300  BP: 127/90 (!) 161/100  Pulse: 69 69  Resp: 15 16  Temp:  98.4 F (36.9 C)   Constitutional: Alert and oriented. Well appearing and in no apparent distress. HEENT:      Head: Normocephalic and atraumatic.         Eyes: Conjunctivae are normal. Sclera is non-icteric. EOMI. PERRL      Mouth/Throat: Mucous membranes are moist.       Neck: Supple with no signs of meningismus. Cardiovascular: Regular rate and rhythm. No murmurs, gallops, or rubs. 2+ symmetrical distal pulses are present in all extremities. No JVD. Respiratory: Normal respiratory effort. Lungs  are clear to auscultation bilaterally. No wheezes, crackles, or rhonchi.  Gastrointestinal: Soft, non tender, and non distended with positive bowel sounds. No rebound or guarding. Musculoskeletal: Nontender with normal range of motion in all extremities. No edema, cyanosis, or erythema of extremities. Neurologic: Normal speech and language. Face is symmetric. Moving all extremities. No gross focal neurologic deficits are appreciated. Skin: Skin is warm, dry and intact. No rash noted. Psychiatric: Mood and affect are normal. Speech and behavior are normal.  ____________________________________________   LABS (all labs ordered are listed, but only abnormal results are displayed)  Labs Reviewed  CBC WITH DIFFERENTIAL/PLATELET - Abnormal; Notable for the following:       Result Value   WBC 12.0 (*)    RBC 3.41 (*)    Hemoglobin 11.9 (*)    HCT 35.1 (*)    MCV 102.8  (*)    MCH 35.0 (*)    Neutro Abs 9.2 (*)    Monocytes Absolute 1.2 (*)    All other components within normal limits  COMPREHENSIVE METABOLIC PANEL - Abnormal; Notable for the following:    Glucose, Bld 102 (*)    Total Protein 6.2 (*)    All other components within normal limits  URINALYSIS COMPLETEWITH MICROSCOPIC ([facility] ONLY) - Abnormal; Notable for the following:    Color, Urine STRAW (*)    APPearance CLEAR (*)    All other components within normal limits  TROPONIN I  LIPASE, BLOOD   ____________________________________________  EKG  ED ECG REPORT I, Rudene Re, the attending physician, personally viewed and interpreted this ECG.  Normal sinus rhythm, rate of 80, normal intervals, normal axis, no ST elevations or depressions. ____________________________________________  RADIOLOGY  CXR: No acute abnormality noted.   CTA chest: No evidence of aneurysm or dissection involving the thoracic or abdominal aorta. No evidence of significant pulmonary embolus. Esophageal wall is diffusely thickened possibly indicating reflux disease or esophagitis. Multiple lucent lesions throughout the skeleton consistent with multiple myeloma. Pathologic fracture at T9 demonstrating progression since previous study. ____________________________________________   PROCEDURES  Procedure(s) performed: None Procedures Critical Care performed:  None ____________________________________________   INITIAL IMPRESSION / ASSESSMENT AND PLAN / ED COURSE  60 y.o. male with a history of multiple myeloma currently undergoing chemotherapy on Thursdays and Fridays, hypertension and esophagitis who presents for evaluation of chest pressure, pleuritic, improved with sublingual nitro. Patient is well-appearing, in no distress, his vital signs are within normal limits, pain is not reproducible on palpation of the chest, abdomen is soft and nontender. EKG showing no evidence of ischemia. Differential  diagnoses including ACS versus PE versus pneumonia and the patient was immune suppressed. Patient does not meet SIRS criteria at this time. Will give another sublingual nitro here. Patient received full dose of aspirin per EMS. We'll cycle troponins, check basic labs, CT of the chest to rule out pulmonary embolism. Watch patient on telemetry.  Clinical Course  Comment By Time  CTA c/a/p with no acute dissection or PE. First troponin negative. Patient with worsening CP, given nitro and morphine. Currently pain is mild. WBC mildly elevated but no evidence of PNA based on symptoms, imaging, lack of cough. Will discuss with the hospitalist for admission for chest pain rule out. Rudene Re, MD 10/04 2244    Pertinent labs & imaging results that were available during my care of the patient were reviewed by me and considered in my medical decision making (see chart for details).    ____________________________________________   FINAL CLINICAL  IMPRESSION(S) / ED DIAGNOSES  Final diagnoses:  Chest pain, unspecified type      NEW MEDICATIONS STARTED DURING THIS VISIT:  New Prescriptions   No medications on file     Note:  This document was prepared using Dragon voice recognition software and may include unintentional dictation errors.    Rudene Re, MD 05/18/16 [phone]

## 2016-05-18 NOTE — ED Triage Notes (Signed)
Patient presents to ED via [facility] from home with c/o intermittent left sided chest pain since yesterday, pt reports increase pain with movement and inspiration. Per EMS pt was given 1 Nitro and 324 of aspirin. Pt initial bp was 190/120, decreased after nitro to 168/100. Pt reports some relief for chest pain after nitro. Denies nausea or vomiting, denies abdominal pain or shortness of breath. Pt alert and oriented x 4. Respirations even and unlabored.

## 2016-05-18 NOTE — H&P (Signed)
[facility] [place] NAME: Erik Nguyen    MR#:  [id number]  DATE OF BIRTH:  10-03-1955  DATE OF ADMISSION:  05/18/2016  PRIMARY CARE PHYSICIAN: Minerva Ends, MD   REQUESTING/REFERRING PHYSICIAN: Alfred Levins, MD  CHIEF COMPLAINT:   Chief Complaint  Patient presents with  . Chest Pain    HISTORY OF PRESENT ILLNESS:  Erik Nguyen  is a 60 y.o. male who presents with Same. Patient states that he had an episode of chest pain which began about 24 hours ago and lasted through most of the night. He called EMS earlier this afternoon as the pain was not relenting and his oncology nurse recommended he come in for evaluation. Pain was pressure-like pain, on the left side of his chest radiating to his back, which he states was not exertional, but neither was similar to pains that he has had in the past of his multiple myeloma or indigestion. Here in the ED his workup was initially negative, but given the fact that his pain improved with his initial nitroglycerin dose and then recurred, it was felt he should be brought in for observation tonight and further evaluation. His pain was associated with some mild shortness of breath. Hospitalists were called for admission.  PAST MEDICAL HISTORY:   Past Medical History:  Diagnosis Date  . Encounter for antineoplastic chemotherapy 02/09/2016  . History of syphilis 02/24/2016  . Hypertension Dx 2016  . Multiple myeloma not having achieved remission ([place]) 09/01/2015  . Priapism 2014     PAST SURGICAL HISTORY:   Past Surgical History:  Procedure Laterality Date  . COLONOSCOPY    . POLYPECTOMY      SOCIAL HISTORY:   Social History  Substance Use Topics  . Smoking status: Former Smoker    Packs/day: 1.00    Types: Cigarettes  . Smokeless tobacco: Never Used  . Alcohol use No     Comment: last use Jan 2015    FAMILY HISTORY:   Family History  Problem Relation Age of Onset  . Asthma  Mother   . Cancer Father   . Prostate cancer Father   . Colon cancer Neg Hx   . Rectal cancer Neg Hx   . Stomach cancer Neg Hx     DRUG ALLERGIES:   Allergies  Allergen Reactions  . Benadryl [Diphenhydramine] Other (See Comments)    priapism  . Trazodone And Nefazodone     priapism  . Latex Itching and Rash    MEDICATIONS AT HOME:   Prior to Admission medications   Medication Sig Start Date End Date Taking? Authorizing Provider  acyclovir (ZOVIRAX) 400 MG tablet Take 1 tablet (400 mg total) by mouth 2 (two) times daily. 01/04/16  Yes Heath Lark, MD  amLODipine (NORVASC) 10 MG tablet Take 1 tablet (10 mg total) by mouth daily. 03/25/16  Yes Heath Lark, MD  aspirin EC 81 MG tablet Take 81 mg by mouth daily.   Yes Historical Provider, MD  bisacodyl (DULCOLAX) 5 MG EC tablet Take 5 mg by mouth 2 (two) times daily as needed for moderate constipation.   Yes Historical Provider, MD  CALCIUM PO Take 1 tablet by mouth daily.   Yes Historical Provider, MD  cholecalciferol (VITAMIN D) 1000 units tablet Take 2,000 Units by mouth daily.   Yes Historical Provider, MD  dexamethasone (DECADRON) 4 MG tablet Take 5 pills every week on Thursdays with food Patient taking differently: Take 5 pills every  week on Fridays with food 02/09/16  Yes Heath Lark, MD  diclofenac sodium (VOLTAREN) 1 % GEL Apply 2 g topically 4 (four) times daily. Patient taking differently: Apply 2 g topically 4 (four) times daily as needed (pain).  10/26/15  Yes Heath Lark, MD  gabapentin (NEURONTIN) 300 MG capsule Take 2 capsules (600 mg total) by mouth 3 (three) times daily. Patient taking differently: Take 300 mg by mouth 3 (three) times daily.  04/21/16  Yes Heath Lark, MD  lidocaine-prilocaine (EMLA) cream Apply to affected area once 05/12/16  Yes Heath Lark, MD  lisinopril-hydrochlorothiazide (PRINZIDE,ZESTORETIC) 20-25 MG tablet Take 1 tablet by mouth daily. 03/28/16  Yes Dionne Bucy McClung, PA-C  pantoprazole (PROTONIX) 40 MG  tablet Take 1 tablet (40 mg total) by mouth daily. 12/14/15  Yes Heath Lark, MD  polyethylene glycol (MIRALAX / GLYCOLAX) packet Take 17 g by mouth 2 (two) times daily. 09/16/15  Yes Eugenie Filler, MD  senna-docusate (SENOKOT-S) 8.6-50 MG tablet Take 1 tablet by mouth 2 (two) times daily. 10/12/15  Yes Heath Lark, MD  traMADol (ULTRAM) 50 MG tablet Take 1-2 tablets (50-100 mg total) by mouth every 6 (six) hours as needed. for pain 05/12/16  Yes Heath Lark, MD  zolpidem (AMBIEN) 10 MG tablet Take 1 tablet (10 mg total) by mouth at bedtime as needed for sleep. 04/21/16 05/21/16 Yes Heath Lark, MD    REVIEW OF SYSTEMS:  Review of Systems  Constitutional: Negative for chills, fever, malaise/fatigue and weight loss.  HENT: Negative for ear pain, hearing loss and tinnitus.   Eyes: Negative for blurred vision, double vision, pain and redness.  Respiratory: Positive for shortness of breath. Negative for cough and hemoptysis.   Cardiovascular: Positive for chest pain. Negative for palpitations, orthopnea and leg swelling.  Gastrointestinal: Negative for abdominal pain, constipation, diarrhea, nausea and vomiting.  Genitourinary: Negative for dysuria, frequency and hematuria.  Musculoskeletal: Negative for back pain, joint pain and neck pain.  Skin:       No acne, rash, or lesions  Neurological: Negative for dizziness, tremors, focal weakness and weakness.  Endo/Heme/Allergies: Negative for polydipsia. Does not bruise/bleed easily.  Psychiatric/Behavioral: Negative for depression. The patient is not nervous/anxious and does not have insomnia.      VITAL SIGNS:   Vitals:   05/18/16 2130 05/18/16 2200 05/18/16 2230 05/18/16 2300  BP: (!) 168/109 (!) 135/104 127/90 (!) 161/100  Pulse: 95 90 69 69  Resp: 16 16 15 16   Temp:    98.4 F (36.9 C)  TempSrc:    Oral  SpO2: 100% 95% 99% 97%  Weight:      Height:       Wt Readings from Last 3 Encounters:  05/18/16 73.9 kg (163 lb)  04/21/16 73.7 kg (162  lb 8 oz)  03/28/16 73.1 kg (161 lb 3.2 oz)    PHYSICAL EXAMINATION:  Physical Exam  Vitals reviewed. Constitutional: He is oriented to person, place, and time. He appears well-developed and well-nourished. No distress.  HENT:  Head: Normocephalic and atraumatic.  Mouth/Throat: Oropharynx is clear and moist.  Eyes: Conjunctivae and EOM are normal. Pupils are equal, round, and reactive to light. No scleral icterus.  Neck: Normal range of motion. Neck supple. No JVD present. No thyromegaly present.  Cardiovascular: Normal rate, regular rhythm and intact distal pulses.  Exam reveals no gallop and no friction rub.   No murmur heard. Respiratory: Effort normal and breath sounds normal. No respiratory distress. He has no wheezes. He  has no rales.  GI: Soft. Bowel sounds are normal. He exhibits no distension. There is no tenderness.  Musculoskeletal: Normal range of motion. He exhibits no edema.  No arthritis, no gout  Lymphadenopathy:    He has no cervical adenopathy.  Neurological: He is alert and oriented to person, place, and time. No cranial nerve deficit.  No dysarthria, no aphasia  Skin: Skin is warm and dry. No rash noted. No erythema.  Psychiatric: He has a normal mood and affect. His behavior is normal. Judgment and thought content normal.    LABORATORY PANEL:   CBC  Recent Labs Lab 05/18/16 1944  WBC 12.0*  HGB 11.9*  HCT 35.1*  PLT 158   ------------------------------------------------------------------------------------------------------------------  Chemistries   Recent Labs Lab 05/18/16 1944  NA 138  K 3.5  CL 102  CO2 29  GLUCOSE 102*  BUN 10  CREATININE 1.08  CALCIUM 9.0  AST 20  ALT 25  ALKPHOS 43  BILITOT 0.6   ------------------------------------------------------------------------------------------------------------------  Cardiac Enzymes  Recent Labs Lab 05/18/16 1944  TROPONINI <0.03    ------------------------------------------------------------------------------------------------------------------  RADIOLOGY:  Dg Chest Portable 1 View  Result Date: 05/18/2016 CLINICAL DATA:  Chest pain for 1 day EXAM: PORTABLE CHEST 1 VIEW COMPARISON:  03/24/2016 FINDINGS: The heart size and mediastinal contours are within normal limits. Both lungs are clear. The visualized skeletal structures show a stable expansile lesion in the right clavicle consistent with the given clinical history of myeloma. Right chest wall port is again seen. IMPRESSION: No acute abnormality noted. Electronically Signed   By: Inez Catalina M.D.   On: 05/18/2016 20:32   Ct Angio Chest/abd/pel For Dissection W And/or Wo Contrast  Result Date: 05/18/2016 CLINICAL DATA:  Intermittent left-sided chest pain since yesterday. Relief with nitroglycerin. Hypertension. EXAM: CT ANGIOGRAPHY CHEST, ABDOMEN AND PELVIS TECHNIQUE: Multidetector CT imaging through the chest, abdomen and pelvis was performed using the standard protocol during bolus administration of intravenous contrast. Multiplanar reconstructed images and MIPs were obtained and reviewed to evaluate the vascular anatomy. CONTRAST:  125 mL Isovue 370 COMPARISON:  CT abdomen and pelvis 09/12/2015 FINDINGS: CTA CHEST FINDINGS Cardiovascular: Noncontrast images of the chest demonstrate scattered calcification in the aorta and calcification in the coronary arteries. There is a right central venous catheter with tip in the superior vena cava. Normal caliber thoracic aorta. No evidence of intramural hematoma. Gas is demonstrated in the main pulmonary artery, likely resulting from intravenous injections. Images obtained during arterial phase after dynamic injection of intravenous contrast material demonstrate normal caliber thoracic aorta with motion artifact in the aortic root. No evidence of aortic dissection or aneurysm. Great vessel origins are patent. Central pulmonary arteries  are well opacified without evidence of any significant pulmonary embolus. Normal heart size. Mediastinum/Nodes: Esophagus is partially gas filled without abnormal distention. There is evidence of esophageal wall thickening. This may be due to reflux disease or esophagitis. Mediastinal lymph nodes are not pathologically enlarged. Lungs/Pleura: Atelectasis in the lung bases, likely dependent. No focal airspace disease or consolidation. No pleural effusions. No pneumothorax. Airways appear patent. Musculoskeletal: Normal alignment of the thoracic spine. Multiple lucent lesions are demonstrated throughout multiple thoracic vertebrae, the sternum, and multiple ribs. This is consistent with diffuse multiple myeloma. There is a pathologic fracture of the T9 vertebral body, demonstrating progression since the prior CT abdomen and pelvis. Review of the MIP images confirms the above findings. CTA ABDOMEN AND PELVIS FINDINGS VASCULAR Aorta: Normal caliber aorta without aneurysm, dissection, vasculitis or significant stenosis. Scattered  aortic calcifications. Celiac: Patent without evidence of aneurysm, dissection, vasculitis or significant stenosis. SMA: Patent without evidence of aneurysm, dissection, vasculitis or significant stenosis. Renals: Both renal arteries are patent without evidence of aneurysm, dissection, vasculitis, fibromuscular dysplasia or significant stenosis. IMA: Patent without evidence of aneurysm, dissection, vasculitis or significant stenosis. Inflow: Patent without evidence of aneurysm, dissection, vasculitis or significant stenosis. Veins: No obvious venous abnormality within the limitations of this arterial phase study. Filling defects demonstrated in superior mesenteric vein likely result from inflow well opacified applied. Review of the MIP images confirms the above findings. NON-VASCULAR Hepatobiliary: No focal liver abnormality is seen. No gallstones, gallbladder wall thickening, or biliary  dilatation. Pancreas: Unremarkable. No pancreatic ductal dilatation or surrounding inflammatory changes. Spleen: Normal in size without focal abnormality. Adrenals/Urinary Tract: Adrenal glands are unremarkable. Kidneys are normal, without renal calculi, focal lesion, or hydronephrosis. Bladder is unremarkable. Stomach/Bowel: Stomach is within normal limits. Appendix appears normal. No evidence of bowel wall thickening, distention, or inflammatory changes. Lymphatic: No significant vascular findings are present. No enlarged abdominal or pelvic lymph nodes. Reproductive: Prostate is unremarkable. Other: No abdominal wall hernia or abnormality. No abdominopelvic ascites. Musculoskeletal: Normal alignment of the lumbar spine. Multiple lucent lesions throughout the lumbar vertebrae, pelvis, sacrum, and hips consistent with history of multiple myeloma. Review of the MIP images confirms the above findings. IMPRESSION: No evidence of aneurysm or dissection involving the thoracic or abdominal aorta. No evidence of significant pulmonary embolus. Esophageal wall is diffusely thickened possibly indicating reflux disease or esophagitis. Multiple lucent lesions throughout the skeleton consistent with multiple myeloma. Pathologic fracture at T9 demonstrating progression since previous study. Electronically Signed   By: Lucienne Capers M.D.   On: 05/18/2016 22:31    EKG:   Orders placed or performed during the hospital encounter of 05/18/16  . ED EKG  . ED EKG    IMPRESSION AND PLAN:  Principal Problem:   Chest pain - Initially negative workup in the ED. Pain is currently controlled. We will check his cardiac enzymes tonight given an echocardiogram in the morning and a cardiology consult. Active Problems:   HTN (hypertension) - elevated here in the ED. Continue home meds as well as when necessary antihypertensives here for blood pressure goal less than 160/100.   Peripheral neuropathy due to chemotherapy ([place]) -  continue home meds   GERD (gastroesophageal reflux disease) - home dose PPI   Multiple myeloma ([place]) - patient is actively getting treatments for this  All the records are reviewed and case discussed with ED provider. Management plans discussed with the patient and/or family.  DVT PROPHYLAXIS: SubQ lovenox  GI PROPHYLAXIS: PPI  ADMISSION STATUS: Observation  CODE STATUS: Full Code Status History    Date Active Date Inactive Code Status Order ID Comments User Context   09/12/2015 12:29 PM 09/16/2015  4:21 PM Full Code [id number]  Domenic Polite, MD Inpatient   12/28/2014 11:26 PM 01/01/2015  3:03 PM Full Code [id number]  Theressa Millard, MD Inpatient      TOTAL TIME TAKING CARE OF THIS PATIENT: 40 minutes.    Naturi Alarid FIELDING 05/18/2016, 11:04 PM  [facility]  Office  [phone]  CC: Primary care physician; Minerva Ends, MD

## 2016-05-18 NOTE — ED Notes (Signed)
Pt provided with [place] tray and orange juice per Dr. Jannifer Franklin.

## 2016-05-18 NOTE — ED Notes (Signed)
Pt c/o increased chest pain to left side of chest, MD notified.

## 2016-05-18 NOTE — ED Notes (Signed)
Pt c/o chest pain, reports nitro "only helped a little." MD notified, see MAR.

## 2016-05-19 ENCOUNTER — Observation Stay
Admit: 2016-05-19 | Discharge: 2016-05-19 | Disposition: A | Discharge status: Home / Self Care | Payer: Medicaid Other | Attending: Internal Medicine | Admitting: Internal Medicine

## 2016-05-19 ENCOUNTER — Telehealth: Payer: Self-pay | Admitting: *Deleted

## 2016-05-19 ENCOUNTER — Observation Stay: Payer: Medicaid Other

## 2016-05-19 ENCOUNTER — Other Ambulatory Visit: Payer: Medicaid Other

## 2016-05-19 ENCOUNTER — Ambulatory Visit: Payer: Medicaid Other

## 2016-05-19 ENCOUNTER — Other Ambulatory Visit: Payer: Self-pay | Admitting: Hematology and Oncology

## 2016-05-19 ENCOUNTER — Ambulatory Visit: Payer: Medicaid Other | Admitting: Hematology and Oncology

## 2016-05-19 LAB — CBC WITH DIFFERENTIAL/PLATELET
BASOS ABS: 0.2 10*3/uL — AB (ref 0–0.1)
Basophils Relative: 2 %
EOS ABS: 0.1 10*3/uL (ref 0–0.7)
EOS PCT: 1 %
HCT: 34.9 % — ABNORMAL LOW (ref 40.0–52.0)
HEMOGLOBIN: 12 g/dL — AB (ref 13.0–18.0)
LYMPHS PCT: 13 %
Lymphs Abs: 1.4 10*3/uL (ref 1.0–3.6)
MCH: 35.5 pg — ABNORMAL HIGH (ref 26.0–34.0)
MCHC: 34.5 g/dL (ref 32.0–36.0)
MCV: 103 fL — ABNORMAL HIGH (ref 80.0–100.0)
Monocytes Absolute: 1 10*3/uL (ref 0.2–1.0)
Monocytes Relative: 9 %
NEUTROS PCT: 75 %
Neutro Abs: 8.1 10*3/uL — ABNORMAL HIGH (ref 1.4–6.5)
PLATELETS: 154 10*3/uL (ref 150–440)
RBC: 3.39 MIL/uL — AB (ref 4.40–5.90)
RDW: 14.1 % (ref 11.5–14.5)
WBC: 10.7 10*3/uL — AB (ref 3.8–10.6)

## 2016-05-19 LAB — BASIC METABOLIC PANEL
Anion gap: 7 (ref 5–15)
BUN: 10 mg/dL (ref 6–20)
CHLORIDE: 105 mmol/L (ref 101–111)
CO2: 31 mmol/L (ref 22–32)
CREATININE: 1.13 mg/dL (ref 0.61–1.24)
Calcium: 8.8 mg/dL — ABNORMAL LOW (ref 8.9–10.3)
Glucose, Bld: 162 mg/dL — ABNORMAL HIGH (ref 65–99)
POTASSIUM: 4 mmol/L (ref 3.5–5.1)
SODIUM: 143 mmol/L (ref 135–145)

## 2016-05-19 LAB — ECHOCARDIOGRAM COMPLETE
Height: 66 in
WEIGHTICAEL: 2608 [oz_av]

## 2016-05-19 LAB — TROPONIN I: Troponin I: <0.03 ng/mL (ref <0.03)

## 2016-05-19 MED ORDER — HYDROCHLOROTHIAZIDE 25 MG PO TABS
25.0000 mg | ORAL_TABLET | Freq: Every day | ORAL | Status: DC
  Administered 2016-05-19: 25 mg via ORAL
  Filled 2016-05-19: qty 1

## 2016-05-19 MED ORDER — MORPHINE SULFATE (PF) 2 MG/ML IV SOLN: 2.0000 mg | INTRAVENOUS | Status: DC | PRN

## 2016-05-19 MED ORDER — LISINOPRIL 20 MG PO TABS
20.0000 mg | ORAL_TABLET | Freq: Every day | ORAL | Status: DC
  Administered 2016-05-19: 20 mg via ORAL
  Filled 2016-05-19: qty 1

## 2016-05-19 MED ORDER — ASPIRIN EC 81 MG PO TBEC
81.0000 mg | DELAYED_RELEASE_TABLET | Freq: Every day | ORAL | Status: DC
  Administered 2016-05-19: 81 mg via ORAL
  Filled 2016-05-19: qty 1

## 2016-05-19 MED ORDER — FAMOTIDINE 20 MG PO TABS
40.0000 mg | ORAL_TABLET | Freq: Every day | ORAL | Status: DC
  Administered 2016-05-19: 40 mg via ORAL
  Filled 2016-05-19: qty 2

## 2016-05-19 MED ORDER — ZOLPIDEM TARTRATE 5 MG PO TABS: 10.0000 mg | ORAL_TABLET | Freq: Every evening | ORAL | Status: DC | PRN

## 2016-05-19 MED ORDER — HEPARIN SOD (PORK) LOCK FLUSH 100 UNIT/ML IV SOLN
500.0000 [IU] | Freq: Once | INTRAVENOUS | Status: AC
  Administered 2016-05-19: 500 [IU] via INTRAVENOUS
  Filled 2016-05-19: qty 5

## 2016-05-19 MED ORDER — RANITIDINE HCL 150 MG PO TABS: 150.0000 mg | ORAL_TABLET | Freq: Every morning | ORAL | 0 refills | Status: DC

## 2016-05-19 MED ORDER — ACETAMINOPHEN 650 MG RE SUPP: 650.0000 mg | Freq: Four times a day (QID) | RECTAL | Status: DC | PRN

## 2016-05-19 MED ORDER — TRAMADOL HCL 50 MG PO TABS
50.0000 mg | ORAL_TABLET | Freq: Two times a day (BID) | ORAL | Status: DC | PRN
Start: 2016-05-19 — End: 2016-05-19

## 2016-05-19 MED ORDER — PANTOPRAZOLE SODIUM 40 MG PO TBEC
40.0000 mg | DELAYED_RELEASE_TABLET | Freq: Every day | ORAL | Status: DC
  Administered 2016-05-19: 40 mg via ORAL
  Filled 2016-05-19: qty 1

## 2016-05-19 MED ORDER — SODIUM CHLORIDE 0.9% FLUSH
3.0000 mL | Freq: Two times a day (BID) | INTRAVENOUS | Status: DC
  Administered 2016-05-19 (×2): 3 mL via INTRAVENOUS

## 2016-05-19 MED ORDER — GABAPENTIN 300 MG PO CAPS
300.0000 mg | ORAL_CAPSULE | Freq: Three times a day (TID) | ORAL | Status: DC
  Administered 2016-05-19 (×2): 300 mg via ORAL
  Filled 2016-05-19 (×2): qty 1

## 2016-05-19 MED ORDER — ENOXAPARIN SODIUM 40 MG/0.4ML ~~LOC~~ SOLN: 40.0000 mg | SUBCUTANEOUS | Status: DC

## 2016-05-19 MED ORDER — AMLODIPINE BESYLATE 10 MG PO TABS
10.0000 mg | ORAL_TABLET | Freq: Every day | ORAL | Status: DC
  Administered 2016-05-19: 10 mg via ORAL
  Filled 2016-05-19: qty 1

## 2016-05-19 MED ORDER — ACYCLOVIR 200 MG PO CAPS
400.0000 mg | ORAL_CAPSULE | Freq: Two times a day (BID) | ORAL | Status: DC
  Administered 2016-05-19 (×2): 400 mg via ORAL
  Filled 2016-05-19 (×2): qty 2

## 2016-05-19 MED ORDER — ACETAMINOPHEN 325 MG PO TABS
650.0000 mg | ORAL_TABLET | Freq: Four times a day (QID) | ORAL | Status: DC | PRN
Start: 2016-05-19 — End: 2016-05-19

## 2016-05-19 MED ORDER — LISINOPRIL-HYDROCHLOROTHIAZIDE 20-25 MG PO TABS: 1.0000 | ORAL_TABLET | Freq: Every day | ORAL | Status: DC

## 2016-05-19 MED ORDER — OXYCODONE HCL 5 MG PO TABS: 5.0000 mg | ORAL_TABLET | ORAL | Status: DC | PRN

## 2016-05-19 NOTE — Telephone Encounter (Signed)
Wife called. Pt was dx with acid reflux. They added zantac to his medication regimen. They ruled out any heart involvement.  He just got home.

## 2016-05-19 NOTE — Telephone Encounter (Signed)
Informed wife of Dr. Alvy Bimler aware pt admitted to hospital for chest pains.  Will cancel his appts this week and r/s as soon as we know he is being discharged.   Informed wife his labs were good per Dr. Alvy Bimler and not to worry about missing this treatment.  Once his heart is cleared then we can r/s his chemo to next week.  Please call us back as soon as she knows he is being d/c'd.  She verbalized understanding.

## 2016-05-19 NOTE — Discharge Instructions (Signed)
F/u with your oncologist at [facility] next week on your scheduled appt

## 2016-05-19 NOTE — Telephone Encounter (Signed)
Call received @ 840 from Jamor Ajayi patients wife, stating that patient had been admitted in [place]  She ask for a return call @ [phone]

## 2016-05-19 NOTE — Progress Notes (Signed)
Discharge instructions along with home medication list and follow up gone over with patient and wife. Both verbalized that they understood instructions. Port a cath de accessed, telemetry removed. Handwritten rx for zantac given to patient. Patient to be discharged home on RA, no c/o pain no distress noted.

## 2016-05-19 NOTE — Consult Note (Signed)
Erik Nguyen  CARDIOLOGY CONSULT NOTE  Patient ID: Erik Nguyen MRN: [id number] DOB/AGE: September 04, 1955 60 y.o.  Admit date: 05/18/2016 Referring Physician Dr. Fritzi Mandes Primary Physician Dr. Alvy Bimler Primary Cardiologist   Reason for Consultation chest pain  HPI:  Pt is a 60 yo male who has history of multiple myeloma awaiting stem cell transplant, history of htn who was admitted after prolonged episode of mid sternal chest pain. He stated it was 7/10 for over a day prior to presentation. It was not positional, exertional . He admitted to eating a very large meal the night before this happened. He has ruled out for mi. Echo done at [facility] in June was normal. Echo noe during this admission was normal. He has no ekg changes. He is pain free at present.   Review of Systems  Constitutional: Negative.   HENT: Negative.   Eyes: Negative.   Respiratory: Negative.   Cardiovascular: Positive for chest pain.  Gastrointestinal: Positive for heartburn.  Genitourinary: Negative.   Musculoskeletal: Negative.   Skin: Negative.   Neurological: Negative.   Endo/Heme/Allergies: Negative.   Psychiatric/Behavioral: Negative.     Past Medical History:  Diagnosis Date  . Encounter for antineoplastic chemotherapy 02/09/2016  . History of syphilis 02/24/2016  . Hypertension Dx 2016  . Multiple myeloma not having achieved remission ([place]) 09/01/2015  . Priapism 2014     Family History  Problem Relation Age of Onset  . Asthma Mother   . Cancer Father   . Prostate cancer Father   . Colon cancer Neg Hx   . Rectal cancer Neg Hx   . Stomach cancer Neg Hx     Social History   Social History  . Marital status: Married    Spouse name: N/A  . Number of children: N/A  . Years of education: N/A   Occupational History  . Not on file.   Social History Main Topics  . Smoking status: Former Smoker    Packs/day: 1.00    Types: Cigarettes  .  Smokeless tobacco: Never Used  . Alcohol use No     Comment: last use Jan 2015  . Drug use: No     Comment: last use Sep 21, 2013  . Sexual activity: Not on file     Comment: worked fund raising, living in a program, married   Other Topics Concern  . Not on file   Social History Narrative  . No narrative on file    Past Surgical History:  Procedure Laterality Date  . COLONOSCOPY    . POLYPECTOMY       Prescriptions Prior to Admission  Medication Sig Dispense Refill Last Dose  . acyclovir (ZOVIRAX) 400 MG tablet Take 1 tablet (400 mg total) by mouth 2 (two) times daily. 60 tablet 3 05/18/2016 at Unknown time  . amLODipine (NORVASC) 10 MG tablet Take 1 tablet (10 mg total) by mouth daily. 30 tablet 1 05/18/2016 at Unknown time  . aspirin EC 81 MG tablet Take 81 mg by mouth daily.   05/18/2016 at Unknown time  . bisacodyl (DULCOLAX) 5 MG EC tablet Take 5 mg by mouth 2 (two) times daily as needed for moderate constipation.   prn  . CALCIUM PO Take 1 tablet by mouth daily.   05/18/2016 at Unknown time  . cholecalciferol (VITAMIN D) 1000 units tablet Take 2,000 Units by mouth daily.   05/18/2016 at Unknown time  . dexamethasone (DECADRON) 4 MG tablet  Take 5 pills every week on Thursdays with food (Patient taking differently: Take 5 pills every week on Fridays with food) 40 tablet 4 Past Week at Unknown time  . diclofenac sodium (VOLTAREN) 1 % GEL Apply 2 g topically 4 (four) times daily. (Patient taking differently: Apply 2 g topically 4 (four) times daily as needed (pain). ) 100 g 2 05/18/2016 at Unknown time  . gabapentin (NEURONTIN) 300 MG capsule Take 2 capsules (600 mg total) by mouth 3 (three) times daily. (Patient taking differently: Take 300 mg by mouth 3 (three) times daily. ) 90 capsule 6 05/18/2016 at Unknown time  . lidocaine-prilocaine (EMLA) cream Apply to affected area once 30 g 3 prn  . lisinopril-hydrochlorothiazide (PRINZIDE,ZESTORETIC) 20-25 MG tablet Take 1 tablet by mouth  daily. 90 tablet 3 05/18/2016 at Unknown time  . pantoprazole (PROTONIX) 40 MG tablet Take 1 tablet (40 mg total) by mouth daily. 30 tablet 6 05/18/2016 at Unknown time  . polyethylene glycol (MIRALAX / GLYCOLAX) packet Take 17 g by mouth 2 (two) times daily. 14 each 0 05/18/2016 at Unknown time  . senna-docusate (SENOKOT-S) 8.6-50 MG tablet Take 1 tablet by mouth 2 (two) times daily. 60 tablet 3 05/18/2016 at Unknown time  . traMADol (ULTRAM) 50 MG tablet Take 1-2 tablets (50-100 mg total) by mouth every 6 (six) hours as needed. for pain 90 tablet 0 prn  . zolpidem (AMBIEN) 10 MG tablet Take 1 tablet (10 mg total) by mouth at bedtime as needed for sleep. 30 tablet 3 05/17/2016 at Unknown time    Physical Exam: Blood pressure (!) 151/93, pulse 86, temperature 98.5 F (36.9 C), temperature source Oral, resp. rate 18, height _0  (1.676 m), weight 73.9 kg (163 lb), SpO2 100 %.   Wt Readings from Last 1 Encounters:  05/19/16 73.9 kg (163 lb)     General appearance: alert and cooperative Head: Normocephalic, without obvious abnormality, atraumatic Resp: clear to auscultation bilaterally Chest wall: no tenderness Cardio: regular rate and rhythm Extremities: extremities normal, atraumatic, no cyanosis or edema Neurologic: Grossly normal  Labs:   Lab Results  Component Value Date   WBC 10.7 (H) 05/19/2016   HGB 12.0 (L) 05/19/2016   HCT 34.9 (L) 05/19/2016   MCV 103.0 (H) 05/19/2016   PLT 154 05/19/2016    Recent Labs Lab 05/18/16 1944 05/19/16 0222  NA 138 143  K 3.5 4.0  CL 102 105  CO2 29 31  BUN 10 10  CREATININE 1.08 1.13  CALCIUM 9.0 8.8*  PROT 6.2*  --   BILITOT 0.6  --   ALKPHOS 43  --   ALT 25  --   AST 20  --   GLUCOSE 102* 162*   Lab Results  Component Value Date   TROPONINI <0.03 05/19/2016      Radiology: cxr showed no cardiopulmonary abnormalies. Chestct consistant with MM but no disection or pulmonary embolus.   EKG: nsr  ASSESSMENT AND PLAN:  Pt with  atypical chest pain who has ruled out for an mi. Echo showed normal lv function with normal chamber size and no evidnce of valvular abnormality. Pain appears to be reflux in origin. It was not related to exertion and existed for approximately 24 hours prior to presentation with normal troponin. Would continue with current meds with the addition of more aggressive PPI therapy as you are doing. Does not need any further inpatient cardiac work up. OK for discharge. Will follow up as outpatient if desired.  Signed:  Teodoro Spray MD, [facility] 05/19/2016, 12:43 PM

## 2016-05-19 NOTE — Progress Notes (Signed)
Patient complains of abdominal cramping and constipation; Prune, cranberry, apple juice cocktail given; Patient requests a laxative. Barbaraann Faster, RN 5:01 AM 05/19/2016

## 2016-05-19 NOTE — Telephone Encounter (Signed)
Pls call his wife back. His recent labs look very good, don't worry about missing his Rx today (please cancel all his appt) Please call us when he is released and I will schedule rtn appt to see him back

## 2016-05-19 NOTE — Care Management (Signed)
No dc needs identified for the observation patient

## 2016-05-19 NOTE — Discharge Summary (Signed)
[facility] [place] NAME: Erik Nguyen    MR#:  [id number]  DATE OF BIRTH:  [birth year]  DATE OF ADMISSION:  05/18/2016 ADMITTING PHYSICIAN: Lance Coon, MD  DATE OF DISCHARGE:05/19/16  PRIMARY CARE PHYSICIAN: Minerva Ends, MD    ADMISSION DIAGNOSIS:  Chest pain, unspecified type [R07.9]  DISCHARGE DIAGNOSIS:  Chest pain ruled out MI Reflux esophagitis HTN H/o MM undergoing chemo  SECONDARY DIAGNOSIS:   Past Medical History:  Diagnosis Date  . Encounter for antineoplastic chemotherapy 02/09/2016  . History of syphilis 02/24/2016  . Hypertension Dx 2016  . Multiple myeloma not having achieved remission ([place]) 09/01/2015  . Priapism 2014     HOSPITAL COURSE:  Erik Nguyen  is a 60 y.o. male who presents with Same. Patient states that he had an episode of chest pain which began about 24 hours ago and lasted through most of the night. He called EMS earlier this afternoon as the pain was not relenting and his oncology nurse recommended he come in for evaluation. Pain was pressure-like pain, on the left side of his chest radiating to his back, which he states was not exertional  1. Chest pain - ruled out MI with 4 negative CE, normal EKG and tele NSR -cont asa daily -Echo prelim looks ok and echo 1 month ago was norma; -CT chest neg for dissection and PE  2.  HTN (hypertension) -Cont home meds  3. GERD (gastroesophageal reflux disease) - home dose PPI -CT chest showed esphageal thickening c/w reflux esophagitis likely cause for his chest discomfort -cont PPI and added Zantac for break through Acid reflux -small frequent  Meals  4.  Peripheral neuropathy due to chemotherapy [facility]) - continue home meds  5.  Multiple myeloma (Erik Nguyen) - patient is actively getting treatments for this. He has appt next week  D/w pt and wife D/w dr Ubaldo Glassing agreeable with plan Will d/c later today after lunch CONSULTS OBTAINED:  Treatment  Team:  Teodoro Spray, MD  DRUG ALLERGIES:   Allergies  Allergen Reactions  . Benadryl [Diphenhydramine] Other (See Comments)    priapism  . Trazodone And Nefazodone     priapism  . Latex Itching and Rash    DISCHARGE MEDICATIONS:   Current Discharge Medication List    START taking these medications   Details  ranitidine (ZANTAC) 150 MG tablet Take 1 tablet (150 mg total) by mouth every morning. Qty: 30 tablet, Refills: 0      CONTINUE these medications which have NOT CHANGED   Details  acyclovir (ZOVIRAX) 400 MG tablet Take 1 tablet (400 mg total) by mouth 2 (two) times daily. Qty: 60 tablet, Refills: 3   Associated Diagnoses: Bence-Jones proteinuria; Multiple myeloma not having achieved remission (HCC)    amLODipine (NORVASC) 10 MG tablet Take 1 tablet (10 mg total) by mouth daily. Qty: 30 tablet, Refills: 1    aspirin EC 81 MG tablet Take 81 mg by mouth daily.    bisacodyl (DULCOLAX) 5 MG EC tablet Take 5 mg by mouth 2 (two) times daily as needed for moderate constipation.    CALCIUM PO Take 1 tablet by mouth daily.    cholecalciferol (VITAMIN D) 1000 units tablet Take 2,000 Units by mouth daily.    dexamethasone (DECADRON) 4 MG tablet Take 5 pills every week on Thursdays with food Qty: 40 tablet, Refills: 4   Associated Diagnoses: Multiple myeloma in remission (HCC)    diclofenac sodium (VOLTAREN)  1 % GEL Apply 2 g topically 4 (four) times daily. Qty: 100 g, Refills: 2   Associated Diagnoses: Closed right clavicular fracture, with routine healing, subsequent encounter    gabapentin (NEURONTIN) 300 MG capsule Take 2 capsules (600 mg total) by mouth 3 (three) times daily. Qty: 90 capsule, Refills: 6   Associated Diagnoses: Peripheral neuropathy due to chemotherapy (HCC)    lidocaine-prilocaine (EMLA) cream Apply to affected area once Qty: 30 g, Refills: 3   Associated Diagnoses: Multiple myeloma in remission (HCC)    lisinopril-hydrochlorothiazide  (PRINZIDE,ZESTORETIC) 20-25 MG tablet Take 1 tablet by mouth daily. Qty: 90 tablet, Refills: 3    pantoprazole (PROTONIX) 40 MG tablet Take 1 tablet (40 mg total) by mouth daily. Qty: 30 tablet, Refills: 6   Associated Diagnoses: Esophagitis    polyethylene glycol (MIRALAX / GLYCOLAX) packet Take 17 g by mouth 2 (two) times daily. Qty: 14 each, Refills: 0    senna-docusate (SENOKOT-S) 8.6-50 MG tablet Take 1 tablet by mouth 2 (two) times daily. Qty: 60 tablet, Refills: 3    traMADol (ULTRAM) 50 MG tablet Take 1-2 tablets (50-100 mg total) by mouth every 6 (six) hours as needed. for pain Qty: 90 tablet, Refills: 0    zolpidem (AMBIEN) 10 MG tablet Take 1 tablet (10 mg total) by mouth at bedtime as needed for sleep. Qty: 30 tablet, Refills: 3   Associated Diagnoses: Multiple myeloma in remission ([place]); Family history of prostate cancer in father; Cancer associated pain; Antineoplastic chemotherapy induced pancytopenia ([place]); Bone pain; Peripheral neuropathy due to chemotherapy [facility])        If you experience worsening of your admission symptoms, develop shortness of breath, life threatening emergency, suicidal or homicidal thoughts you must seek medical attention immediately by calling 911 or calling your MD immediately  if symptoms less severe.  You Must read complete instructions/literature along with all the possible adverse reactions/side effects for all the Medicines you take and that have been prescribed to you. Take any new Medicines after you have completely understood and accept all the possible adverse reactions/side effects.   Please note  You were cared for by a hospitalist during your hospital stay. If you have any questions about your discharge medications or the care you received while you were in the hospital after you are discharged, you can call the unit and asked to speak with the hospitalist on call if the hospitalist that took care of you is not available. Once you are  discharged, your primary care physician will handle any further medical issues. Please note that NO REFILLS for any discharge medications will be authorized once you are discharged, as it is imperative that you return to your primary care physician (or establish a relationship with a primary care physician if you do not have one) for your aftercare needs so that they can reassess your need for medications and monitor your lab values. Today   SUBJECTIVE   No new complaints  VITAL SIGNS:  Blood pressure (!) 151/93, pulse 86, temperature 98.5 F (36.9 C), temperature source Oral, resp. rate 18, height 5' 6"  (1.676 m), weight 73.9 kg (163 lb), SpO2 100 %.  I/O:   Intake/Output Summary (Last 24 hours) at 05/19/16 1201 Last data filed at 05/19/16 1043  Gross per 24 hour  Intake             1003 ml  Output               50 ml  Net              953 ml    PHYSICAL EXAMINATION:  GENERAL:  60 y.o.-year-old patient lying in the bed with no acute distress.  EYES: Pupils equal, round, reactive to light and accommodation. No scleral icterus. Extraocular muscles intact.  HEENT: Head atraumatic, normocephalic. Oropharynx and nasopharynx clear.  NECK:  Supple, no jugular venous distention. No thyroid enlargement, no tenderness.  LUNGS: Normal breath sounds bilaterally, no wheezing, rales,rhonchi or crepitation. No use of accessory muscles of respiration.  CARDIOVASCULAR: S1, S2 normal. No murmurs, rubs, or gallops.  ABDOMEN: Soft, non-tender, non-distended. Bowel sounds present. No organomegaly or mass.  EXTREMITIES: No pedal edema, cyanosis, or clubbing.  NEUROLOGIC: Cranial nerves II through XII are intact. Muscle strength 5/5 in all extremities. Sensation intact. Gait not checked.  PSYCHIATRIC: The patient is alert and oriented x 3.  SKIN: No obvious rash, lesion, or ulcer.   DATA REVIEW:   CBC   Recent Labs Lab 05/19/16 0222  WBC 10.7*  HGB 12.0*  HCT 34.9*  PLT 154    Chemistries    Recent Labs Lab 05/18/16 1944 05/19/16 0222  NA 138 143  K 3.5 4.0  CL 102 105  CO2 29 31  GLUCOSE 102* 162*  BUN 10 10  CREATININE 1.08 1.13  CALCIUM 9.0 8.8*  AST 20  --   ALT 25  --   ALKPHOS 43  --   BILITOT 0.6  --     Microbiology Results   No results found for this or any previous visit (from the past 240 hour(s)).  RADIOLOGY:  Dg Chest Portable 1 View  Result Date: 05/18/2016 CLINICAL DATA:  Chest pain for 1 day EXAM: PORTABLE CHEST 1 VIEW COMPARISON:  03/24/2016 FINDINGS: The heart size and mediastinal contours are within normal limits. Both lungs are clear. The visualized skeletal structures show a stable expansile lesion in the right clavicle consistent with the given clinical history of myeloma. Right chest wall port is again seen. IMPRESSION: No acute abnormality noted. Electronically Signed   By: Inez Catalina M.D.   On: 05/18/2016 20:32   Ct Angio Chest/abd/pel For Dissection W And/or Wo Contrast  Result Date: 05/18/2016 CLINICAL DATA:  Intermittent left-sided chest pain since yesterday. Relief with nitroglycerin. Hypertension. EXAM: CT ANGIOGRAPHY CHEST, ABDOMEN AND PELVIS TECHNIQUE: Multidetector CT imaging through the chest, abdomen and pelvis was performed using the standard protocol during bolus administration of intravenous contrast. Multiplanar reconstructed images and MIPs were obtained and reviewed to evaluate the vascular anatomy. CONTRAST:  125 mL Isovue 370 COMPARISON:  CT abdomen and pelvis 09/12/2015 FINDINGS: CTA CHEST FINDINGS Cardiovascular: Noncontrast images of the chest demonstrate scattered calcification in the aorta and calcification in the coronary arteries. There is a right central venous catheter with tip in the superior vena cava. Normal caliber thoracic aorta. No evidence of intramural hematoma. Gas is demonstrated in the main pulmonary artery, likely resulting from intravenous injections. Images obtained during arterial phase after dynamic  injection of intravenous contrast material demonstrate normal caliber thoracic aorta with motion artifact in the aortic root. No evidence of aortic dissection or aneurysm. Great vessel origins are patent. Central pulmonary arteries are well opacified without evidence of any significant pulmonary embolus. Normal heart size. Mediastinum/Nodes: Esophagus is partially gas filled without abnormal distention. There is evidence of esophageal wall thickening. This may be due to reflux disease or esophagitis. Mediastinal lymph nodes are not pathologically enlarged. Lungs/Pleura: Atelectasis in the lung bases, likely  dependent. No focal airspace disease or consolidation. No pleural effusions. No pneumothorax. Airways appear patent. Musculoskeletal: Normal alignment of the thoracic spine. Multiple lucent lesions are demonstrated throughout multiple thoracic vertebrae, the sternum, and multiple ribs. This is consistent with diffuse multiple myeloma. There is a pathologic fracture of the T9 vertebral body, demonstrating progression since the prior CT abdomen and pelvis. Review of the MIP images confirms the above findings. CTA ABDOMEN AND PELVIS FINDINGS VASCULAR Aorta: Normal caliber aorta without aneurysm, dissection, vasculitis or significant stenosis. Scattered aortic calcifications. Celiac: Patent without evidence of aneurysm, dissection, vasculitis or significant stenosis. SMA: Patent without evidence of aneurysm, dissection, vasculitis or significant stenosis. Renals: Both renal arteries are patent without evidence of aneurysm, dissection, vasculitis, fibromuscular dysplasia or significant stenosis. IMA: Patent without evidence of aneurysm, dissection, vasculitis or significant stenosis. Inflow: Patent without evidence of aneurysm, dissection, vasculitis or significant stenosis. Veins: No obvious venous abnormality within the limitations of this arterial phase study. Filling defects demonstrated in superior mesenteric  vein likely result from inflow well opacified applied. Review of the MIP images confirms the above findings. NON-VASCULAR Hepatobiliary: No focal liver abnormality is seen. No gallstones, gallbladder wall thickening, or biliary dilatation. Pancreas: Unremarkable. No pancreatic ductal dilatation or surrounding inflammatory changes. Spleen: Normal in size without focal abnormality. Adrenals/Urinary Tract: Adrenal glands are unremarkable. Kidneys are normal, without renal calculi, focal lesion, or hydronephrosis. Bladder is unremarkable. Stomach/Bowel: Stomach is within normal limits. Appendix appears normal. No evidence of bowel wall thickening, distention, or inflammatory changes. Lymphatic: No significant vascular findings are present. No enlarged abdominal or pelvic lymph nodes. Reproductive: Prostate is unremarkable. Other: No abdominal wall hernia or abnormality. No abdominopelvic ascites. Musculoskeletal: Normal alignment of the lumbar spine. Multiple lucent lesions throughout the lumbar vertebrae, pelvis, sacrum, and hips consistent with history of multiple myeloma. Review of the MIP images confirms the above findings. IMPRESSION: No evidence of aneurysm or dissection involving the thoracic or abdominal aorta. No evidence of significant pulmonary embolus. Esophageal wall is diffusely thickened possibly indicating reflux disease or esophagitis. Multiple lucent lesions throughout the skeleton consistent with multiple myeloma. Pathologic fracture at T9 demonstrating progression since previous study. Electronically Signed   By: Lucienne Capers M.D.   On: 05/18/2016 22:31     Management plans discussed with the patient, family and they are in agreement.  CODE STATUS:     Code Status Orders        Start     Ordered   05/19/16 0124  Full code  Continuous     05/19/16 0123    Code Status History    Date Active Date Inactive Code Status Order ID Comments User Context   09/12/2015 12:29 PM 09/16/2015   4:21 PM Full Code [id number]  Domenic Polite, MD Inpatient   12/28/2014 11:26 PM 01/01/2015  3:03 PM Full Code [id number]  Theressa Millard, MD Inpatient      TOTAL TIME TAKING CARE OF THIS PATIENT: 40 minutes.    Stephaine Breshears M.D on 05/19/2016 at 12:01 PM  Between 7am to 6pm - Pager - [phone] After 6pm go to [email or web address] - password [email or web address] [facility]  Office  [phone]  CC: Primary care physician; Minerva Ends, MD

## 2016-05-19 NOTE — Progress Notes (Signed)
*  PRELIMINARY RESULTS* Echocardiogram 2D Echocardiogram has been performed.  Sherrie Sport 05/19/2016, 11:57 AM

## 2016-05-20 ENCOUNTER — Ambulatory Visit: Payer: Medicaid Other

## 2016-05-20 NOTE — Telephone Encounter (Signed)
Note opened in error.

## 2016-05-23 ENCOUNTER — Inpatient Hospital Stay (HOSPITAL_COMMUNITY)
Admission: EM | Admit: 2016-05-23 | Discharge: 2016-05-25 | DRG: 641 | Disposition: A | Discharge status: Home / Self Care | Payer: Medicaid Other | Attending: Internal Medicine | Admitting: Internal Medicine

## 2016-05-23 ENCOUNTER — Emergency Department (HOSPITAL_COMMUNITY): Payer: Medicaid Other

## 2016-05-23 ENCOUNTER — Encounter (HOSPITAL_COMMUNITY): Payer: Self-pay | Admitting: Emergency Medicine

## 2016-05-23 ENCOUNTER — Emergency Department (HOSPITAL_BASED_OUTPATIENT_CLINIC_OR_DEPARTMENT_OTHER): Admit: 2016-05-23 | Discharge: 2016-05-23 | Disposition: A | Discharge status: Home / Self Care | Payer: Medicaid Other

## 2016-05-23 ENCOUNTER — Telehealth: Payer: Self-pay | Admitting: Hematology and Oncology

## 2016-05-23 ENCOUNTER — Other Ambulatory Visit: Payer: Self-pay

## 2016-05-23 DIAGNOSIS — Z8042 Family history of malignant neoplasm of prostate: Secondary | ICD-10-CM

## 2016-05-23 DIAGNOSIS — Z9889 Other specified postprocedural states: Secondary | ICD-10-CM

## 2016-05-23 DIAGNOSIS — R0902 Hypoxemia: Secondary | ICD-10-CM | POA: Diagnosis present

## 2016-05-23 DIAGNOSIS — Z87891 Personal history of nicotine dependence: Secondary | ICD-10-CM

## 2016-05-23 DIAGNOSIS — C9002 Multiple myeloma in relapse: Secondary | ICD-10-CM | POA: Diagnosis present

## 2016-05-23 DIAGNOSIS — Z79899 Other long term (current) drug therapy: Secondary | ICD-10-CM

## 2016-05-23 DIAGNOSIS — Z91040 Latex allergy status: Secondary | ICD-10-CM

## 2016-05-23 DIAGNOSIS — Z825 Family history of asthma and other chronic lower respiratory diseases: Secondary | ICD-10-CM

## 2016-05-23 DIAGNOSIS — C9000 Multiple myeloma not having achieved remission: Secondary | ICD-10-CM | POA: Diagnosis present

## 2016-05-23 DIAGNOSIS — G8929 Other chronic pain: Secondary | ICD-10-CM | POA: Diagnosis present

## 2016-05-23 DIAGNOSIS — I10 Essential (primary) hypertension: Secondary | ICD-10-CM | POA: Diagnosis present

## 2016-05-23 DIAGNOSIS — Z888 Allergy status to other drugs, medicaments and biological substances status: Secondary | ICD-10-CM

## 2016-05-23 DIAGNOSIS — E860 Dehydration: Principal | ICD-10-CM | POA: Diagnosis present

## 2016-05-23 DIAGNOSIS — Z7982 Long term (current) use of aspirin: Secondary | ICD-10-CM

## 2016-05-23 DIAGNOSIS — M79661 Pain in right lower leg: Secondary | ICD-10-CM | POA: Diagnosis present

## 2016-05-23 DIAGNOSIS — R0602 Shortness of breath: Secondary | ICD-10-CM

## 2016-05-23 DIAGNOSIS — M79609 Pain in unspecified limb: Secondary | ICD-10-CM

## 2016-05-23 DIAGNOSIS — Z9221 Personal history of antineoplastic chemotherapy: Secondary | ICD-10-CM

## 2016-05-23 LAB — COMPREHENSIVE METABOLIC PANEL
ALT: 29 U/L (ref 17–63)
ANION GAP: 11 (ref 5–15)
AST: 23 U/L (ref 15–41)
Albumin: 4.2 g/dL (ref 3.5–5.0)
Alkaline Phosphatase: 57 U/L (ref 38–126)
BILIRUBIN TOTAL: 0.7 mg/dL (ref 0.3–1.2)
BUN: 26 mg/dL — ABNORMAL HIGH (ref 6–20)
CHLORIDE: 104 mmol/L (ref 101–111)
CO2: 22 mmol/L (ref 22–32)
Calcium: 9.4 mg/dL (ref 8.9–10.3)
Creatinine, Ser: 1.19 mg/dL (ref 0.61–1.24)
GFR calc Af Amer: >60 mL/min (ref >60)
GFR calc non Af Amer: >60 mL/min (ref >60)
GLUCOSE: 122 mg/dL — AB (ref 65–99)
POTASSIUM: 3.8 mmol/L (ref 3.5–5.1)
Sodium: 137 mmol/L (ref 135–145)
TOTAL PROTEIN: 8 g/dL (ref 6.5–8.1)

## 2016-05-23 LAB — CBC WITH DIFFERENTIAL/PLATELET
BASOS ABS: 0 10*3/uL (ref 0.0–0.1)
Basophils Relative: 0 %
Eosinophils Absolute: 0.1 10*3/uL (ref 0.0–0.7)
Eosinophils Relative: 1 %
HEMATOCRIT: 39.6 % (ref 39.0–52.0)
Hemoglobin: 13.8 g/dL (ref 13.0–17.0)
LYMPHS ABS: 1.2 10*3/uL (ref 0.7–4.0)
LYMPHS PCT: 21 %
MCH: 34.5 pg — ABNORMAL HIGH (ref 26.0–34.0)
MCHC: 34.8 g/dL (ref 30.0–36.0)
MCV: 99 fL (ref 78.0–100.0)
MONO ABS: 0.6 10*3/uL (ref 0.1–1.0)
Monocytes Relative: 10 %
NEUTROS ABS: 4 10*3/uL (ref 1.7–7.7)
Neutrophils Relative %: 68 %
Platelets: 306 10*3/uL (ref 150–400)
RBC: 4 MIL/uL — AB (ref 4.22–5.81)
RDW: 12.8 % (ref 11.5–15.5)
WBC: 5.8 10*3/uL (ref 4.0–10.5)

## 2016-05-23 LAB — URINALYSIS, ROUTINE W REFLEX MICROSCOPIC
GLUCOSE, UA: NEGATIVE mg/dL
Hgb urine dipstick: NEGATIVE
Ketones, ur: NEGATIVE mg/dL
LEUKOCYTES UA: NEGATIVE
Nitrite: NEGATIVE
PH: 6.5 (ref 5.0–8.0)
Protein, ur: NEGATIVE mg/dL
Specific Gravity, Urine: 1.028 (ref 1.005–1.030)

## 2016-05-23 LAB — I-STAT CG4 LACTIC ACID, ED
LACTIC ACID, VENOUS: 3.03 mmol/L — AB (ref 0.5–1.9)
LACTIC ACID, VENOUS: 1.76 mmol/L (ref 0.5–1.9)

## 2016-05-23 MED ORDER — IBUPROFEN 200 MG PO TABS
600.0000 mg | ORAL_TABLET | Freq: Four times a day (QID) | ORAL | Status: DC | PRN
  Administered 2016-05-23 – 2016-05-24 (×2): 600 mg via ORAL
  Filled 2016-05-23 (×2): qty 3

## 2016-05-23 MED ORDER — SODIUM CHLORIDE 0.9 % IV SOLN: 1000.0000 mL | INTRAVENOUS | Status: DC

## 2016-05-23 MED ORDER — TRAMADOL HCL 50 MG PO TABS
50.0000 mg | ORAL_TABLET | Freq: Four times a day (QID) | ORAL | Status: DC | PRN
  Administered 2016-05-23: 50 mg via ORAL
  Filled 2016-05-23: qty 1

## 2016-05-23 MED ORDER — IOPAMIDOL (ISOVUE-370) INJECTION 76%: 100.0000 mL | Freq: Once | INTRAVENOUS | Status: DC | PRN

## 2016-05-23 MED ORDER — SODIUM CHLORIDE 0.9 % IV BOLUS (SEPSIS)
1000.0000 mL | Freq: Once | INTRAVENOUS | Status: AC
  Administered 2016-05-23: 1000 mL via INTRAVENOUS

## 2016-05-23 MED ORDER — SODIUM CHLORIDE 0.9 % IV SOLN
INTRAVENOUS | Status: DC
  Administered 2016-05-24: 01:00:00 via INTRAVENOUS

## 2016-05-23 NOTE — ED Notes (Signed)
Pt walk from room to the end of the hall oxygen start 100% and drop down to 72% on the way back to the room

## 2016-05-23 NOTE — ED Notes (Signed)
Delay on istat lactic acid, RN aware

## 2016-05-23 NOTE — ED Provider Notes (Signed)
[facility] DEPT Provider Note   CSN: [id number] Arrival date & time: 05/23/16  1518     History   Chief Complaint Chief Complaint  Patient presents with  . Chills  . chemo card  . Shortness of Breath  . Leg Pain    HPI Erik Nguyen is a 60 y.o. male.  HPI   Patient is a 60 year old male with a history of multiple myeloma undergoing chemotherapy every Thursday, HTN who presents to the emergency department with complaints of chills, sweats, shortness of breath for 3 days. Patient was discharged from [facility] last Thursday for chest pain workup. Patient states Thursday when he got home he still was not feeling well and still experienced some chest pain. Patient also complaining of intermittent abdominal pain that is 3/10, nonradiating that waxes and wanes to a 7/10. Patient also has right lower leg pain started on Friday. The pain is constant 3/10 worse with walking, 7/10 on the anterior lateral aspect of his right calf. Patient is taking gabapentin and tramadol without relief of his leg pain. Patient states he did not go to chemotherapy last Thursday because he was in the hospital. Patient currently denies chest pain, vomiting, back pain, dizziness, headache, visual changes, dysuria, hematuria, changes in bowel habits.  Past Medical History:  Diagnosis Date  . Encounter for antineoplastic chemotherapy 02/09/2016  . History of syphilis 02/24/2016  . Hypertension Dx 2016  . Multiple myeloma not having achieved remission ([place]) 09/01/2015  . Priapism 2014     Patient Active Problem List   Diagnosis Date Noted  . Shortness of breath 05/23/2016  . Chest pain 05/18/2016  . GERD (gastroesophageal reflux disease) 05/18/2016  . Blepharoconjunctivitis of both eyes 04/21/2016  . Dyspnea 03/25/2016  . Left leg pain 03/24/2016  . Port catheter in place 02/25/2016  . Pancytopenia due to antineoplastic chemotherapy ([place]) 02/25/2016  . History of syphilis 02/24/2016  . Encounter  for antineoplastic chemotherapy 02/09/2016  . Skin rash 12/14/2015  . 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 Antineoplastic chemotherapy induced pancytopenia ([place])   . Bone pain   . Cancer associated pain 09/02/2015  . Multiple myeloma ([place]) 09/01/2015  . 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 Vitamin D deficiency 07/22/2015  . Closed right clavicular fracture 07/17/2015  . Chest wall pain 03/12/2015  . Thoracolumbar back pain 03/12/2015  . Allergic rhinitis 03/12/2015  . Poor dentition 01/15/2015  . Nerve damage 01/15/2015  . Esophagitis 12/28/2014  . Lower GI bleed   . Hemorrhoids 11/06/2014  . HTN (hypertension) 11/06/2014  . Priapism 11/06/2014  . Decreased visual acuity 11/06/2014  . Family history of prostate cancer in father 11/06/2014    Past Surgical History:  Procedure Laterality Date  . COLONOSCOPY    . POLYPECTOMY         Home Medications    Prior to Admission medications   Medication Sig Start Date End Date Taking? Authorizing Provider  acyclovir (ZOVIRAX) 400 MG tablet Take 1 tablet (400 mg total) by mouth 2 (two) times daily. 01/04/16  Yes Heath Lark, MD  aspirin EC 81 MG tablet Take 81 mg by mouth daily.   Yes Historical Provider, MD  bisacodyl (DULCOLAX) 5 MG EC tablet Take 5 mg by mouth 2 (two) times daily as needed for moderate constipation.   Yes Historical Provider, MD  CALCIUM PO Take 1 tablet by mouth daily.   Yes Historical Provider, MD  cholecalciferol (VITAMIN D) 1000 units tablet Take 1,000 Units by mouth daily.  Yes Historical Provider, MD  dexamethasone (DECADRON) 4 MG tablet Take 5 pills every week on Thursdays with food Patient taking differently: Take 5 tablets (20 mg total) once  Weekly on Mondays with food. 02/09/16  Yes Heath Lark, MD  diclofenac sodium (VOLTAREN) 1 % GEL Apply 2 g topically 4 (four) times daily. Patient taking differently: Apply 2 g topically 4 (four) times daily as needed (pain).  10/26/15  Yes Heath Lark, MD  gabapentin (NEURONTIN) 300 MG capsule Take 2 capsules (600 mg total) by mouth 3 (three) times daily. Patient taking differently: Take 300 mg by mouth 3 (three) times daily.  04/21/16  Yes Heath Lark, MD  lidocaine-prilocaine (EMLA) cream Apply to affected area once 05/12/16  Yes Heath Lark, MD  lisinopril-hydrochlorothiazide (PRINZIDE,ZESTORETIC) 20-25 MG tablet Take 1 tablet by mouth daily. 03/28/16  Yes Dionne Bucy McClung, PA-C  pantoprazole (PROTONIX) 40 MG tablet Take 1 tablet (40 mg total) by mouth daily. 12/14/15  Yes Heath Lark, MD  polyethylene glycol (MIRALAX / GLYCOLAX) packet Take 17 g by mouth 2 (two) times daily. 09/16/15  Yes Eugenie Filler, MD  ranitidine (ZANTAC) 150 MG tablet Take 1 tablet (150 mg total) by mouth every morning. 05/19/16 06/18/16 Yes Fritzi Mandes, MD  traMADol (ULTRAM) 50 MG tablet Take 1-2 tablets (50-100 mg total) by mouth every 6 (six) hours as needed. for pain 05/12/16  Yes Heath Lark, MD  zolpidem (AMBIEN) 10 MG tablet Take 1 tablet (10 mg total) by mouth at bedtime as needed for sleep. 04/21/16 05/23/16 Yes Heath Lark, MD    Family History Family History  Problem Relation Age of Onset  . Asthma Mother   . Cancer Father   . Prostate cancer Father   . Colon cancer Neg Hx   . Rectal cancer Neg Hx   . Stomach cancer Neg Hx     Social History Social History  Substance Use Topics  . Smoking status: Former Smoker    Packs/day: 1.00    Types: Cigarettes  . Smokeless tobacco: Never Used  . Alcohol use No     Comment: last use Jan 2015     Allergies   Benadryl [diphenhydramine]; Trazodone and nefazodone; and Latex   Review of Systems Review of Systems  Constitutional: Positive for appetite change and chills. Negative for fever.  Eyes: Negative for visual disturbance.  Respiratory: Positive for shortness of breath. Negative for cough and chest tightness.   Cardiovascular: Negative for chest pain.  Gastrointestinal: Positive for abdominal pain.  Negative for blood in stool, diarrhea, nausea and vomiting.  Genitourinary: Negative for dysuria and hematuria.  Musculoskeletal: Positive for arthralgias and myalgias (right lower leg). Negative for back pain and neck pain.  Skin: Negative for rash and wound.  Allergic/Immunologic: Positive for immunocompromised state.  Neurological: Negative for dizziness and syncope.  Psychiatric/Behavioral: Negative for confusion.     Physical Exam Updated Vital Signs BP 118/88 (BP Location: Right Arm)   Pulse 117   Temp 98.3 F (36.8 C) (Oral)   Resp 18   SpO2 100%   Physical Exam  Constitutional: He appears well-developed and well-nourished. No distress.  HENT:  Head: Normocephalic and atraumatic.  Eyes: Lids are normal. Pupils are equal, round, and reactive to light. Right conjunctiva is injected. Right conjunctiva has no hemorrhage. Left conjunctiva is injected. Left conjunctiva has no hemorrhage.  Neck: Normal range of motion.  Cardiovascular: Normal rate, regular rhythm and normal heart sounds.  Exam reveals no gallop and no friction  rub.   No murmur heard. Pulses:      Radial pulses are 2+ on the right side, and 2+ on the left side.       Dorsalis pedis pulses are 2+ on the right side, and 2+ on the left side.  Pulmonary/Chest: Effort normal and breath sounds normal. No accessory muscle usage. Tachypnea noted. No respiratory distress. He has no decreased breath sounds. He has no wheezes. He has no rhonchi. He has no rales.  Abdominal: Soft. Bowel sounds are normal. He exhibits no distension. There is generalized tenderness. There is no rigidity, no rebound and no guarding.  Musculoskeletal: Normal range of motion. He exhibits no edema.  No deformity, edema, ecchymosis, increased warmth noted to BLE, full ROM, TTP noted to right mid calf, sensation intact, strength 5/5 of BLE, 2+ DP pulses bilaterally, patient is neurovascularly intact distally.  Neurological: He is alert. Coordination  normal.  Skin: Skin is warm and dry. He is not diaphoretic.  Psychiatric: He has a normal mood and affect. His behavior is normal.  Nursing note and vitals reviewed.    ED Treatments / Results  Labs (all labs ordered are listed, but only abnormal results are displayed) Labs Reviewed  COMPREHENSIVE METABOLIC PANEL - Abnormal; Notable for the following:       Result Value   Glucose, Bld 122 (*)    BUN 26 (*)    All other components within normal limits  CBC WITH DIFFERENTIAL/PLATELET - Abnormal; Notable for the following:    RBC 4.00 (*)    MCH 34.5 (*)    All other components within normal limits  URINALYSIS, ROUTINE W REFLEX MICROSCOPIC (NOT AT [facility]) - Abnormal; Notable for the following:    Color, Urine AMBER (*)    Bilirubin Urine SMALL (*)    All other components within normal limits  I-STAT CG4 LACTIC ACID, ED - Abnormal; Notable for the following:    Lactic Acid, Venous 3.03 (*)    All other components within normal limits  CULTURE, BLOOD (SINGLE)  URINE CULTURE  I-STAT CG4 LACTIC ACID, ED    EKG  EKG Interpretation  Date/Time:  Monday May 23 2016 16:35:49 EDT Ventricular Rate:  103 PR Interval:    QRS Duration: 84 QT Interval:  336 QTC Calculation: 440 R Axis:   47 Text Interpretation:  Sinus tachycardia RSR' in V1 or V2, right VCD or RVH Baseline wander in lead(s) V6 Abnormal ekg Confirmed by Carmin Muskrat  MD [phone]) on 05/23/2016 5:20:46 PM       Radiology Dg Chest 2 View  Result Date: 05/23/2016 CLINICAL DATA:  Shortness of breath, multiple myeloma on chemotherapy EXAM: CHEST  2 VIEW COMPARISON:  05/18/2016 FINDINGS: Cardiomediastinal silhouette is unremarkable. There is right IJ Port-A-Cath with tip in distal SVC. No pneumothorax. Stable expansile lesion in right clavicle. No infiltrate or pulmonary edema. IMPRESSION: No active disease.  Right IJ Port-A-Cath in place.  No pneumothorax. Electronically Signed   By: Lahoma Crocker M.D.   On: 05/23/2016 16:42    Dg Tibia/fibula Right  Result Date: 05/23/2016 CLINICAL DATA:  Pain without trauma. EXAM: RIGHT TIBIA AND FIBULA - 2 VIEW COMPARISON:  None. FINDINGS: No acute fracture or dislocation. No focal osseous lesion. No knee joint effusion. IMPRESSION: No acute osseous abnormality. Electronically Signed   By: Abigail Miyamoto M.D.   On: 05/23/2016 18:47   Ct Angio Chest Pe W And/or Wo Contrast  Result Date: 05/23/2016 CLINICAL DATA:  Shortness of breath, history  of multiple myeloma EXAM: CT ANGIOGRAPHY CHEST WITH CONTRAST TECHNIQUE: Multidetector CT imaging of the chest was performed using the standard protocol during bolus administration of intravenous contrast. Multiplanar CT image reconstructions and MIPs were obtained to evaluate the vascular anatomy. CONTRAST:  Chest x-ray 05/23/2016.  CT scan 05/18/2016 COMPARISON:  Chest x-ray 05/23/2016, CT angiogram 05/18/2016 FINDINGS: Cardiovascular: No convincing filling defects within the central or main segmental pulmonary arteries to suggest acute embolus. Thoracic aorta demonstrates no evidence for aneurysm or dissection. A right-sided vascular catheter remains in place. Heart size is nonenlarged. No pericardial effusion. Mediastinum/Nodes: No pathologically enlarged axillary, mediastinal, or hilar lymph nodes. The trachea and mainstem bronchi are within normal limits. Mild air distention of the esophagus. Mild wall thickening of the esophagus unchanged. Lungs/Pleura: Patchy posterior dependent atelectasis. Hazy lingular atelectasis. No acute infiltrate or effusion. No pulmonary nodules. No pneumothorax. Upper Abdomen: No acute finding Musculoskeletal: Again visualized are multiple lucent lesions involving the spine, bilateral ribs, sternum, and right clavicle. Pathologic fracture at T9 again noted. Review of the MIP images confirms the above findings. IMPRESSION: 1. No definite CT evidence for acute pulmonary embolus. No CT evidence for aortic dissection. 2. No acute  pulmonary infiltrate or effusion. 3. Numerous lucent lesions involving the spine, sternum, and bilateral ribs compatible with history of multiple myeloma. Pathologic fracture at T9 as previously reported. Electronically Signed   By: Donavan Foil M.D.   On: 05/23/2016 22:06    Procedures Procedures (including critical care time)  Medications Ordered in ED Medications  iopamidol (ISOVUE-370) 76 % injection 100 mL (not administered)  traMADol (ULTRAM) tablet 50-100 mg (50 mg Oral Given 05/23/16 2346)  ibuprofen (ADVIL,MOTRIN) tablet 600 mg (600 mg Oral Given 05/23/16 2347)  0.9 %  sodium chloride infusion (not administered)  sodium chloride 0.9 % bolus 1,000 mL (0 mLs Intravenous Stopped 05/23/16 1908)     Initial Impression / Assessment and Plan / ED Course  I have reviewed the triage vital signs and the nursing notes.  Pertinent labs & imaging results that were available during my care of the patient were reviewed by me and considered in my medical decision making (see chart for details).  Clinical Course   Patient afebrile upon arrival to the ED. Will check rectal temperature. Will do sepsis workup due to tachycardia and current chemotherapy treatment. Pt may have a hospital acquired infection from recent admission.  Labs, chest x-ray To evaluate for pneumonia, and ultrasound of right lower extremity to rule out DVT pending. Discussed plan with patient and he is amenable to this plan.  7:15PM Pt states he is feeling better and his SOB has improved. I informed the pt that we need to recheck his lactic acid and will determine his disposition after it results.  Labs unremarkable. Patient afebrile, blood cultures pending. Lactic acid normal after bolus of fluids. Patient states his shortness of breath has returned. Patient was ambulated in the hallway and his O2 saturation went to roughly 70% on room air. Although patient had a CT angio study just last week I felt was reasonable to order another  CT to rule out PE. CT scan reviewed by me was negative for PE. Unsure the etiology of the patient's drop in oxygen saturation. I consult with the hospice team for admission for further workup and evaluation of the patient's new oxygen requirement. Spoke with Dr. Loleta Books who will admit the patient for observation and further workup and treatment.  Thank you Dr. Loleta Books for your consult, time  and care of this patient.   Shared visit with Dr. Vanita Panda. Patient case discussed and pt seen by Dr. Vanita Panda.  Final Clinical Impressions(s) / ED Diagnoses   Final diagnoses:  Shortness of breath    New Prescriptions New Prescriptions   No medications on file     Kalman Drape, [place] 05/23/16 2359    Carmin Muskrat, MD 05/24/16 2218

## 2016-05-23 NOTE — ED Notes (Signed)
2 IV start attempts 

## 2016-05-23 NOTE — ED Notes (Signed)
Unable to collect labs patient is not in the room 

## 2016-05-23 NOTE — ED Notes (Signed)
2x unsuccessful stick

## 2016-05-23 NOTE — Progress Notes (Addendum)
**  Preliminary report by tech**  Right lower extremity venous duplex completed. There is no evidence of deep or superficial vein thrombosis involving the right lower extremity. All visualized vessels appear patent and compressible. There is no evidence of a Baker's cyst on the right. Results were given to the patient's nurse, Apolonio Schneiders.   05/23/16 5:16 PM Carlos Levering RVT

## 2016-05-23 NOTE — ED Notes (Signed)
Cannot obtain labs at the moment - pt is getting un ultrasound.

## 2016-05-23 NOTE — ED Notes (Signed)
Urinal at bedside for urine sample.

## 2016-05-23 NOTE — Telephone Encounter (Signed)
10/12 Appointment Scheduled per MD note.

## 2016-05-23 NOTE — H&P (Signed)
History and Physical  Patient Name: Erik Nguyen     NWG:[id number]    DOB: 05/08/1956    DOA: 05/23/2016 PCP: Minerva Ends, MD   Patient coming from: Home  Chief Complaint: Shortness of breath, chest pain, leg pain  HPI: Erik Nguyen is a 60 y.o. male with a past medical history significant for multiple myeloma being considered for HCT at [facility] and HTN who presents with worsening shortness of breath and worsening pain.  The patient has multiple myeloma, was treated first in January with Revlimid/Velcade, then in June with salvage with Kyprolis/Cytoxan and more recently has been evaluated for stem cell transplant at [facility].  He has chronic leg pain, for which he takes tramadol, and has tried to avoid narcotics because of a personal history of drug abuse, sober successfully for several years now.  In the last few weeks, his leg pain has been severe.  He has also had chest pain, for which he was just admitted at [facility] last week.  There he was ruled out for MI with serially negative troponins, normal echocardiogram and ruled out for PE with normal CTA of the chest and discharged with presumptive diagnosis of GERD.  Since discharge, over the last five days, he has had persistent severe pain in his leg and also in his chest.  The chest pain is "like a blanket wrapping around" all over the chest and ribs and back, and severe.  He notes that it is still associated with some shortness of breath.  He also notes that he has had no appetite for days, and only eaten a few mouthfuls each day in the last week.  He is also having some zinging pain in his head with standing.  He has had no frank fever, chils, cough, sputum.  He has had no leg swelling, hemopytsis.  He is taking tramadol, about 4-5 tablets per day, but no other OTC analgesics and no other opioids.  ED course: -Afebrile, heart rate 110s, respirations 18-25, BP 120/90, pulse oximetry normal on room air at rest -Na 137, K 3.8, Cr 1.19 (baseline  1.1), WBC 5.8K, Hgb 13.8 -Lactic acid 3.0 --> 1.7 with IV fluids -CXR was without pneumonia -CT angiogram of the chest was obtained because of concern for PE, and this showed his known lytic bone lesions and no PE or pneumonia or dissection -An ECG showed sinus tachycardia without ST changes -On ambulating once, the patient was noted to have some desaturation to low 80s and so TRH were asked to evaluate for admission  On my evaluation, the patient was ambulated around the nursing station again, and appeared to be in severe pain with weight on his left leg, but his oxygen saturation maintained a good waveform and was 98-100% throughout ambulating.         ROS: Review of Systems  Constitutional: Positive for chills and diaphoresis. Negative for fever.  Respiratory: Positive for shortness of breath. Negative for cough, hemoptysis, sputum production and wheezing.   Cardiovascular: Positive for chest pain. Negative for palpitations, orthopnea and leg swelling.  Gastrointestinal: Positive for abdominal pain and nausea (decreased appetite).  Genitourinary: Negative for dysuria, flank pain, frequency, hematuria and urgency.  Musculoskeletal: Positive for back pain.  All other systems reviewed and are negative.         Past Medical History:  Diagnosis Date  . Encounter for antineoplastic chemotherapy 02/09/2016  . History of syphilis 02/24/2016  . Hypertension Dx 2016  . Multiple myeloma not having achieved remission (  [place]) 09/01/2015  . Priapism 2014     Past Surgical History:  Procedure Laterality Date  . COLONOSCOPY    . POLYPECTOMY      Social History: Patient lives with his wife.  The patient walks unassisted.  He is a former smoker.  He works as a Arboriculturist, or used to.    Allergies  Allergen Reactions  . Benadryl [Diphenhydramine] Other (See Comments)    priapism  . Trazodone And Nefazodone     priapism  . Latex Itching and Rash    Family history: family  history includes Asthma in his mother; Cancer in his father; Prostate cancer in his father.  Prior to Admission medications   Medication Sig Start Date End Date Taking? Authorizing Provider  acyclovir (ZOVIRAX) 400 MG tablet Take 1 tablet (400 mg total) by mouth 2 (two) times daily. 01/04/16  Yes Heath Lark, MD  aspirin EC 81 MG tablet Take 81 mg by mouth daily.   Yes Historical Provider, MD  bisacodyl (DULCOLAX) 5 MG EC tablet Take 5 mg by mouth 2 (two) times daily as needed for moderate constipation.   Yes Historical Provider, MD  CALCIUM PO Take 1 tablet by mouth daily.   Yes Historical Provider, MD  cholecalciferol (VITAMIN D) 1000 units tablet Take 1,000 Units by mouth daily.    Yes Historical Provider, MD  dexamethasone (DECADRON) 4 MG tablet Take 5 pills every week on Thursdays with food Patient taking differently: Take 5 tablets (20 mg total) once  Weekly on Mondays with food. 02/09/16  Yes Heath Lark, MD  diclofenac sodium (VOLTAREN) 1 % GEL Apply 2 g topically 4 (four) times daily. Patient taking differently: Apply 2 g topically 4 (four) times daily as needed (pain).  10/26/15  Yes Heath Lark, MD  gabapentin (NEURONTIN) 300 MG capsule Take 2 capsules (600 mg total) by mouth 3 (three) times daily. Patient taking differently: Take 300 mg by mouth 3 (three) times daily.  04/21/16  Yes Heath Lark, MD  lidocaine-prilocaine (EMLA) cream Apply to affected area once 05/12/16  Yes Heath Lark, MD  lisinopril-hydrochlorothiazide (PRINZIDE,ZESTORETIC) 20-25 MG tablet Take 1 tablet by mouth daily. 03/28/16  Yes Dionne Bucy McClung, PA-C  pantoprazole (PROTONIX) 40 MG tablet Take 1 tablet (40 mg total) by mouth daily. 12/14/15  Yes Heath Lark, MD  polyethylene glycol (MIRALAX / GLYCOLAX) packet Take 17 g by mouth 2 (two) times daily. 09/16/15  Yes Eugenie Filler, MD  ranitidine (ZANTAC) 150 MG tablet Take 1 tablet (150 mg total) by mouth every morning. 05/19/16 06/18/16 Yes Fritzi Mandes, MD  traMADol (ULTRAM) 50 MG  tablet Take 1-2 tablets (50-100 mg total) by mouth every 6 (six) hours as needed. for pain 05/12/16  Yes Heath Lark, MD  zolpidem (AMBIEN) 10 MG tablet Take 1 tablet (10 mg total) by mouth at bedtime as needed for sleep. 04/21/16 05/23/16 Yes Heath Lark, MD  amLODipine (NORVASC) 10 MG tablet Take 1 tablet (10 mg total) by mouth daily. Patient not taking: Reported on 05/23/2016 03/25/16   Heath Lark, MD       Physical Exam: BP 102/82   Pulse 112   Temp 98.6 F (37 C) (Oral)   Resp 22   Ht _0  (1.676 m)   Wt 73.5 kg (162 lb)   SpO2 (!) 79%   BMI 26.15 kg/m  General appearance: Well-developed, adult male, alert and in moderate to severe distress from pain, worse with ambulation or any movement.  Eyes: Anicteric, conjunctiva pink, lids and lashes normal. PERRL.    ENT: No nasal deformity, discharge, epistaxis.  Hearing normal. OP moist without lesions.   Neck: No neck masses.  Trachea midline.  No thyromegaly/tenderness. Lymph: No cervical or supraclavicular lymphadenopathy. Skin: Warm and dry.  No jaundice.  No suspicious rashes or lesions. Cardiac: RRR, nl S1-S2, no murmurs appreciated.  Capillary refill is brisk.  JVP normal.  No LE edema.  Radial and DP pulses 2+ and symmetric. Respiratory: Normal respiratory rate and rhythm.  CTAB without rales or wheezes. Abdomen: Abdomen soft.  No TTP. No ascites, distension, hepatosplenomegaly.   MSK: No deformities or effusions.  No cyanosis or clubbing. Neuro: Cranial nerves normal.  Sensation intact to light touch. Speech is fluent.  Muscle strength normal.   Gait, shuffling and antalgic. Psych: Sensorium intact and responding to questions, attention normal.  Behavior appropriate.  Affect in pain.  Judgment and insight appear normal.     Labs on Admission:  I have personally reviewed following labs and imaging studies: CBC:  Recent Labs Lab 05/18/16 1944 05/19/16 0222 05/23/16 1724  WBC 12.0* 10.7* 5.8  NEUTROABS 9.2* 8.1* 4.0  HGB  11.9* 12.0* 13.8  HCT 35.1* 34.9* 39.6  MCV 102.8* 103.0* 99.0  PLT 158 154 786   Basic Metabolic Panel:  Recent Labs Lab 05/18/16 1944 05/19/16 0222 05/23/16 1724  NA 138 143 137  K 3.5 4.0 3.8  CL 102 105 104  CO2 _0 GLUCOSE 102* 162* 122*  BUN 10 10 26*  CREATININE 1.08 1.13 1.19  CALCIUM 9.0 8.8* 9.4   GFR: Estimated Creatinine Clearance: 59.6 mL/min (by C-G formula based on SCr of 1.19 mg/dL).  Liver Function Tests:  Recent Labs Lab 05/18/16 1944 05/23/16 1724  AST 20 23  ALT 25 29  ALKPHOS 43 57  BILITOT 0.6 0.7  PROT 6.2* 8.0  ALBUMIN 3.7 4.2    Recent Labs Lab 05/18/16 1944  LIPASE 20   No results for input(s): AMMONIA in the last 168 hours. Coagulation Profile: No results for input(s): INR, PROTIME in the last 168 hours. Cardiac Enzymes:  Recent Labs Lab 05/18/16 1944 05/19/16 0222 05/19/16 0800  TROPONINI <0.03 <0.03 <0.03   BNP (last 3 results) No results for input(s): PROBNP in the last 8760 hours. HbA1C: No results for input(s): HGBA1C in the last 72 hours. CBG: No results for input(s): GLUCAP in the last 168 hours. Lipid Profile: No results for input(s): CHOL, HDL, LDLCALC, TRIG, CHOLHDL, LDLDIRECT in the last 72 hours. Thyroid Function Tests: No results for input(s): TSH, T4TOTAL, FREET4, T3FREE, THYROIDAB in the last 72 hours. Anemia Panel: No results for input(s): VITAMINB12, FOLATE, FERRITIN, TIBC, IRON, RETICCTPCT in the last 72 hours. Sepsis Labs: Lactic acid 3.03 --> 1.76 Invalid input(s): PROCALCITONIN, LACTICIDVEN Recent Results (from the past 240 hour(s))  Culture, blood (single)     Status: None (Preliminary result)   Collection Time: 05/23/16  5:24 PM  Result Value Ref Range Status   Specimen Description   Final    BLOOD RIGHT FOREARM Performed at [facility]    Special Requests BOTTLES DRAWN AEROBIC AND ANAEROBIC 5 CC EACH  Final   Culture PENDING  Incomplete   Report Status PENDING  Incomplete           Radiological Exams on Admission: Personally reviewed: Dg Chest 2 View  Result Date: 05/23/2016 CLINICAL DATA:  Shortness of breath, multiple myeloma on chemotherapy EXAM: CHEST  2 VIEW  COMPARISON:  05/18/2016 FINDINGS: Cardiomediastinal silhouette is unremarkable. There is right IJ Port-A-Cath with tip in distal SVC. No pneumothorax. Stable expansile lesion in right clavicle. No infiltrate or pulmonary edema. IMPRESSION: No active disease.  Right IJ Port-A-Cath in place.  No pneumothorax. Electronically Signed   By: Lahoma Crocker M.D.   On: 05/23/2016 16:42   Dg Tibia/fibula Right  Result Date: 05/23/2016 CLINICAL DATA:  Pain without trauma. EXAM: RIGHT TIBIA AND FIBULA - 2 VIEW COMPARISON:  None. FINDINGS: No acute fracture or dislocation. No focal osseous lesion. No knee joint effusion. IMPRESSION: No acute osseous abnormality. Electronically Signed   By: Abigail Miyamoto M.D.   On: 05/23/2016 18:47   Ct Angio Chest Pe W And/or Wo Contrast  Result Date: 05/23/2016 CLINICAL DATA:  Shortness of breath, history of multiple myeloma EXAM: CT ANGIOGRAPHY CHEST WITH CONTRAST TECHNIQUE: Multidetector CT imaging of the chest was performed using the standard protocol during bolus administration of intravenous contrast. Multiplanar CT image reconstructions and MIPs were obtained to evaluate the vascular anatomy. CONTRAST:  Chest x-ray 05/23/2016.  CT scan 05/18/2016 COMPARISON:  Chest x-ray 05/23/2016, CT angiogram 05/18/2016 FINDINGS: Cardiovascular: No convincing filling defects within the central or main segmental pulmonary arteries to suggest acute embolus. Thoracic aorta demonstrates no evidence for aneurysm or dissection. A right-sided vascular catheter remains in place. Heart size is nonenlarged. No pericardial effusion. Mediastinum/Nodes: No pathologically enlarged axillary, mediastinal, or hilar lymph nodes. The trachea and mainstem bronchi are within normal limits. Mild air distention of the  esophagus. Mild wall thickening of the esophagus unchanged. Lungs/Pleura: Patchy posterior dependent atelectasis. Hazy lingular atelectasis. No acute infiltrate or effusion. No pulmonary nodules. No pneumothorax. Upper Abdomen: No acute finding Musculoskeletal: Again visualized are multiple lucent lesions involving the spine, bilateral ribs, sternum, and right clavicle. Pathologic fracture at T9 again noted. Review of the MIP images confirms the above findings. IMPRESSION: 1. No definite CT evidence for acute pulmonary embolus. No CT evidence for aortic dissection. 2. No acute pulmonary infiltrate or effusion. 3. Numerous lucent lesions involving the spine, sternum, and bilateral ribs compatible with history of multiple myeloma. Pathologic fracture at T9 as previously reported. Electronically Signed   By: Donavan Foil M.D.   On: 05/23/2016 22:06    EKG: Independently reviewed. Rate 103, QTc 440, sinus tachcyardia, no ST changes.  Echocardiogram Oct 2017: EF 55-60% No significant valvular disease       Assessment/Plan  1. Dehydration:  Patient describes poor PO intake for days, in context of severe pain flare.   Lactic acid elevated (but improved rapidly with fluids) and BUN/creatinine ratio elevated.  No evidence for infection on my exam. -IVF overnight -Repeat BMP in AM   2. Hypoxia:  Unclear if this was real, as it was not repeatable with my exam. -Repeat oxygen saturation testing with ambulation in morning  3. Multiple myeloma:  -Continue acyclovir ppx -Continue tramadol and gabapentin for pain  4. HTN:  -Continue lisinopril-HCTZ      DVT prophylaxis: Lovenox  Code Status: FULL  Family Communication: Wife at bedsdie  Disposition Plan: Anticipate IVF overnight and repeat labs and re-evaluation in the morning Consults called: None Admission status: OBS At the point of initial evaluation, it is my clinical opinion that admission for OBSERVATION is reasonable and necessary  because the patient's presenting complaints in the context of their chronic conditions represent sufficient risk of deterioration or significant morbidity to constitute reasonable grounds for close observation in the hospital setting, but that the  patient may be medically stable for discharge from the hospital within 24 to 48 hours.    Medical decision making: Patient seen at 11:00 PM on 05/23/2016.  The patient was discussed with Jackson Latino, PA-C.  What exists of the patient's chart was reviewed in depth and summarized above.  Clinical condition: stable.        Edwin Dada [facility] Pager [phone]

## 2016-05-23 NOTE — ED Notes (Signed)
Pt aware of need for urine sample.  

## 2016-05-23 NOTE — ED Triage Notes (Signed)
Patient states that he got his port accessed 5 days ago and hasn't felt right since.  Patient has blue chemo card and c/o chills and SOB.

## 2016-05-23 NOTE — ED Notes (Signed)
Patient currently not in room, delay On ekg and labs

## 2016-05-23 NOTE — ED Triage Notes (Signed)
Patient adds that has RLE pain since Saturday.

## 2016-05-24 ENCOUNTER — Telehealth: Payer: Self-pay | Admitting: *Deleted

## 2016-05-24 DIAGNOSIS — R0902 Hypoxemia: Secondary | ICD-10-CM | POA: Diagnosis not present

## 2016-05-24 DIAGNOSIS — E860 Dehydration: Secondary | ICD-10-CM | POA: Diagnosis not present

## 2016-05-24 DIAGNOSIS — Z8042 Family history of malignant neoplasm of prostate: Secondary | ICD-10-CM | POA: Diagnosis not present

## 2016-05-24 DIAGNOSIS — G8929 Other chronic pain: Secondary | ICD-10-CM | POA: Diagnosis present

## 2016-05-24 DIAGNOSIS — R0602 Shortness of breath: Secondary | ICD-10-CM | POA: Diagnosis present

## 2016-05-24 DIAGNOSIS — Z87891 Personal history of nicotine dependence: Secondary | ICD-10-CM | POA: Diagnosis not present

## 2016-05-24 DIAGNOSIS — Z9889 Other specified postprocedural states: Secondary | ICD-10-CM | POA: Diagnosis not present

## 2016-05-24 DIAGNOSIS — M79661 Pain in right lower leg: Secondary | ICD-10-CM | POA: Diagnosis present

## 2016-05-24 DIAGNOSIS — Z825 Family history of asthma and other chronic lower respiratory diseases: Secondary | ICD-10-CM | POA: Diagnosis not present

## 2016-05-24 DIAGNOSIS — Z9221 Personal history of antineoplastic chemotherapy: Secondary | ICD-10-CM | POA: Diagnosis not present

## 2016-05-24 DIAGNOSIS — Z7982 Long term (current) use of aspirin: Secondary | ICD-10-CM | POA: Diagnosis not present

## 2016-05-24 DIAGNOSIS — Z888 Allergy status to other drugs, medicaments and biological substances status: Secondary | ICD-10-CM | POA: Diagnosis not present

## 2016-05-24 DIAGNOSIS — Z91040 Latex allergy status: Secondary | ICD-10-CM | POA: Diagnosis not present

## 2016-05-24 DIAGNOSIS — Z79899 Other long term (current) drug therapy: Secondary | ICD-10-CM | POA: Diagnosis not present

## 2016-05-24 DIAGNOSIS — I10 Essential (primary) hypertension: Secondary | ICD-10-CM | POA: Diagnosis not present

## 2016-05-24 DIAGNOSIS — C9000 Multiple myeloma not having achieved remission: Secondary | ICD-10-CM | POA: Diagnosis not present

## 2016-05-24 LAB — CBC
HEMATOCRIT: 34 % — AB (ref 39.0–52.0)
HEMOGLOBIN: 11.6 g/dL — AB (ref 13.0–17.0)
MCH: 34.2 pg — ABNORMAL HIGH (ref 26.0–34.0)
MCHC: 34.1 g/dL (ref 30.0–36.0)
MCV: 100.3 fL — ABNORMAL HIGH (ref 78.0–100.0)
Platelets: 269 10*3/uL (ref 150–400)
RBC: 3.39 MIL/uL — AB (ref 4.22–5.81)
RDW: 12.9 % (ref 11.5–15.5)
WBC: 4.6 10*3/uL (ref 4.0–10.5)

## 2016-05-24 LAB — BASIC METABOLIC PANEL
ANION GAP: 10 (ref 5–15)
BUN: 23 mg/dL — ABNORMAL HIGH (ref 6–20)
CALCIUM: 8.2 mg/dL — AB (ref 8.9–10.3)
CO2: 21 mmol/L — ABNORMAL LOW (ref 22–32)
Chloride: 108 mmol/L (ref 101–111)
Creatinine, Ser: 1.1 mg/dL (ref 0.61–1.24)
Glucose, Bld: 110 mg/dL — ABNORMAL HIGH (ref 65–99)
POTASSIUM: 3.7 mmol/L (ref 3.5–5.1)
Sodium: 139 mmol/L (ref 135–145)

## 2016-05-24 MED ORDER — LISINOPRIL-HYDROCHLOROTHIAZIDE 20-25 MG PO TABS: 1.0000 | ORAL_TABLET | Freq: Every day | ORAL | Status: DC

## 2016-05-24 MED ORDER — ZOLPIDEM TARTRATE 10 MG PO TABS
10.0000 mg | ORAL_TABLET | Freq: Every evening | ORAL | Status: DC | PRN
  Administered 2016-05-24: 10 mg via ORAL
  Filled 2016-05-24: qty 1

## 2016-05-24 MED ORDER — DICLOFENAC SODIUM 1 % TD GEL: 2.0000 g | Freq: Four times a day (QID) | TRANSDERMAL | Status: DC | PRN

## 2016-05-24 MED ORDER — HYDROCHLOROTHIAZIDE 25 MG PO TABS: 25.0000 mg | ORAL_TABLET | Freq: Every day | ORAL | Status: DC

## 2016-05-24 MED ORDER — ACYCLOVIR 400 MG PO TABS
400.0000 mg | ORAL_TABLET | Freq: Two times a day (BID) | ORAL | Status: DC
  Administered 2016-05-24 – 2016-05-25 (×4): 400 mg via ORAL
  Filled 2016-05-24 (×4): qty 1

## 2016-05-24 MED ORDER — POLYETHYLENE GLYCOL 3350 17 G PO PACK
17.0000 g | PACK | Freq: Two times a day (BID) | ORAL | Status: DC
  Administered 2016-05-24: 17 g via ORAL
  Filled 2016-05-24 (×3): qty 1

## 2016-05-24 MED ORDER — ACETAMINOPHEN 500 MG PO TABS
500.0000 mg | ORAL_TABLET | Freq: Three times a day (TID) | ORAL | Status: DC
  Administered 2016-05-24 – 2016-05-25 (×4): 500 mg via ORAL
  Filled 2016-05-24 (×4): qty 1

## 2016-05-24 MED ORDER — ENOXAPARIN SODIUM 40 MG/0.4ML ~~LOC~~ SOLN
40.0000 mg | Freq: Every day | SUBCUTANEOUS | Status: DC
  Administered 2016-05-24: 40 mg via SUBCUTANEOUS
  Filled 2016-05-24 (×2): qty 0.4

## 2016-05-24 MED ORDER — LISINOPRIL 20 MG PO TABS
20.0000 mg | ORAL_TABLET | Freq: Every day | ORAL | Status: DC
  Administered 2016-05-24 – 2016-05-25 (×2): 20 mg via ORAL
  Filled 2016-05-24 (×2): qty 1

## 2016-05-24 MED ORDER — SODIUM CHLORIDE 0.9 % IV BOLUS (SEPSIS)
500.0000 mL | Freq: Once | INTRAVENOUS | Status: AC
  Administered 2016-05-24: 500 mL via INTRAVENOUS

## 2016-05-24 MED ORDER — SODIUM CHLORIDE 0.9 % IV SOLN
INTRAVENOUS | Status: DC
  Administered 2016-05-24: 10:00:00 via INTRAVENOUS
  Administered 2016-05-25: 1000 mL via INTRAVENOUS
  Administered 2016-05-25: 09:00:00 via INTRAVENOUS

## 2016-05-24 MED ORDER — INFLUENZA VAC SPLIT QUAD 0.5 ML IM SUSY
0.5000 mL | PREFILLED_SYRINGE | INTRAMUSCULAR | Status: DC
  Filled 2016-05-24: qty 0.5

## 2016-05-24 MED ORDER — GABAPENTIN 300 MG PO CAPS: 300.0000 mg | ORAL_CAPSULE | Freq: Three times a day (TID) | ORAL | Status: DC

## 2016-05-24 MED ORDER — TRAMADOL HCL 50 MG PO TABS
100.0000 mg | ORAL_TABLET | Freq: Four times a day (QID) | ORAL | Status: DC | PRN
  Administered 2016-05-24: 100 mg via ORAL
  Filled 2016-05-24: qty 2

## 2016-05-24 MED ORDER — ASPIRIN EC 81 MG PO TBEC
81.0000 mg | DELAYED_RELEASE_TABLET | Freq: Every day | ORAL | Status: DC
  Administered 2016-05-24 – 2016-05-25 (×2): 81 mg via ORAL
  Filled 2016-05-24 (×2): qty 1

## 2016-05-24 MED ORDER — ACETAMINOPHEN 650 MG RE SUPP: 650.0000 mg | Freq: Four times a day (QID) | RECTAL | Status: DC | PRN

## 2016-05-24 MED ORDER — PANTOPRAZOLE SODIUM 40 MG PO TBEC
40.0000 mg | DELAYED_RELEASE_TABLET | Freq: Every day | ORAL | Status: DC
  Administered 2016-05-24 – 2016-05-25 (×2): 40 mg via ORAL
  Filled 2016-05-24 (×2): qty 1

## 2016-05-24 MED ORDER — ACETAMINOPHEN 325 MG PO TABS
650.0000 mg | ORAL_TABLET | Freq: Four times a day (QID) | ORAL | Status: DC | PRN
  Administered 2016-05-25: 650 mg via ORAL
  Filled 2016-05-24: qty 2

## 2016-05-24 MED ORDER — GABAPENTIN 300 MG PO CAPS
600.0000 mg | ORAL_CAPSULE | Freq: Three times a day (TID) | ORAL | Status: DC
  Administered 2016-05-24 – 2016-05-25 (×4): 600 mg via ORAL
  Filled 2016-05-24 (×4): qty 2

## 2016-05-24 MED ORDER — BISACODYL 5 MG PO TBEC: 5.0000 mg | DELAYED_RELEASE_TABLET | Freq: Two times a day (BID) | ORAL | Status: DC | PRN

## 2016-05-24 MED ORDER — FAMOTIDINE 20 MG PO TABS
20.0000 mg | ORAL_TABLET | Freq: Every day | ORAL | Status: DC
  Administered 2016-05-24 – 2016-05-25 (×2): 20 mg via ORAL
  Filled 2016-05-24 (×2): qty 1

## 2016-05-24 NOTE — Telephone Encounter (Signed)
Called wife to check on pt per Dr. Calton Dach request.  Wife says pt is being d/c'd from hospital today.  He is doing better.  Instructed her to have pt keep his appt w/ Dr. Alvy Bimler in 2 days on Thursday at 1 pm.  No labs needed since he has had done in hospital.  Will plan to proceed w/ chemo as scheduled after he sees Dr. Alvy Bimler if he is feeling better.  Wife verbalized understanding. Denies any needs at this time and will call if anything changes before Thursday.

## 2016-05-24 NOTE — Progress Notes (Signed)
PROGRESS NOTE    Hilery Wintle  ZDG:[id number] DOB: Nov 20, 1955 DOA: 05/23/2016 PCP: Minerva Ends, MD   Brief Narrative:  Patient with history of multiple myeloma, hypertension presented with worsening shortness of breath and worsening pain. Workup to date was negative. CT angiogram chest negative for pulmonary emboli. Patient had a recent normal 2-D echo and ruled out for MI. Patient noted to be dehydrated. Patient admitted placed on IV fluids. Diuretic discontinued. Patient's gabapentin has been increased per recommendations from prior office visit from patient's oncologist to 600 mg 3 times daily.   Assessment & Plan:   Principal Problem:   Dehydration Active Problems:   HTN (hypertension)   Multiple myeloma (HCC)   Hypoxia   #1 dehydration Patient with poor oral intake for several days in the context of pain. Lactic acid level improving with hydration. BUN/creatinine ratio elevated. No source of infection. Discontinue HCTZ. Continue IV fluids. Follow.  2 hypoxia Unclear. Likely error. Repeat O2 sats with ambulation greater than 90%.  #3 multiple myeloma Patient with complaints of increased pain in his right lower extremity and chest. CT angiogram chest negative. Plain films of the right lower extremity negative for any fracture. Will increase patient's gabapentin to 600 mg 3 times daily as noted from last office visit with Dr. Simeon Craft such, hematology oncology. Will increase tramadol 200 mg when necessary. Place on scheduled Tylenol 500 mg daily. Continue acyclovir. Outpatient follow-up with Dr. Simeon Craft such.  #4 hypertension Continue lisinopril. Discontinue HCTZ secondary to dehydration.    DVT prophylaxis: Lovenox Code Status: Full Family Communication: updated patient. No family present. Disposition Plan: Home when hydrated hopefully tomorrow.   Consultants:   None  Procedures:   CT angiogram chest 05/23/2016  Plain films of the right tib-fib  05/23/2016    Antimicrobials:   None   Subjective: Patient states improvement with chest pain and lower extremity pain since admission. No shortness of breath. Patient sleeping easily arousable.  Objective: Vitals:   05/23/16 2343 05/24/16 0005 05/24/16 0420 05/24/16 1404  BP: 138/99 (!) 159/96 129/84 110/80  Pulse: 96 97 96 91  Resp: 19 20 16 16   Temp:  97.7 F (36.5 C) 98 F (36.7 C) 98.3 F (36.8 C)  TempSrc:  Oral Oral Oral  SpO2: 100% 100% 100% 100%  Weight:  71.9 kg (158 lb 9.6 oz)    Height:  5' 6"  (1.676 m)      Intake/Output Summary (Last 24 hours) at 05/24/16 1540 Last data filed at 05/24/16 1325  Gross per 24 hour  Intake          1875.42 ml  Output                0 ml  Net          1875.42 ml   Filed Weights   05/23/16 1726 05/24/16 0005  Weight: 73.5 kg (162 lb) 71.9 kg (158 lb 9.6 oz)    Examination:  General exam: Appears calm and comfortable  Respiratory system: Clear to auscultation. Respiratory effort normal. Cardiovascular system: S1 & S2 heard, RRR. No JVD, murmurs, rubs, gallops or clicks. No pedal edema. Gastrointestinal system: Abdomen is nondistended, soft and nontender. No organomegaly or masses felt. Normal bowel sounds heard. Central nervous system: Alert and oriented. No focal neurological deficits. Extremities: Symmetric 5 x 5 power. Skin: No rashes, lesions or ulcers Psychiatry: Judgement and insight appear normal. Mood & affect appropriate.     Data Reviewed: I have personally reviewed following labs  and imaging studies  CBC:  Recent Labs Lab 05/18/16 1944 05/19/16 0222 05/23/16 1724 05/24/16 0340  WBC 12.0* 10.7* 5.8 4.6  NEUTROABS 9.2* 8.1* 4.0  --   HGB 11.9* 12.0* 13.8 11.6*  HCT 35.1* 34.9* 39.6 34.0*  MCV 102.8* 103.0* 99.0 100.3*  PLT 158 154 306 086   Basic Metabolic Panel:  Recent Labs Lab 05/18/16 1944 05/19/16 0222 05/23/16 1724 05/24/16 0340  NA 138 143 137 139  K 3.5 4.0 3.8 3.7  CL 102 105 104  108  CO2 29 31 22  21*  GLUCOSE 102* 162* 122* 110*  BUN 10 10 26* 23*  CREATININE 1.08 1.13 1.19 1.10  CALCIUM 9.0 8.8* 9.4 8.2*   GFR: Estimated Creatinine Clearance: 64.4 mL/min (by C-G formula based on SCr of 1.1 mg/dL). Liver Function Tests:  Recent Labs Lab 05/18/16 1944 05/23/16 1724  AST 20 23  ALT 25 29  ALKPHOS 43 57  BILITOT 0.6 0.7  PROT 6.2* 8.0  ALBUMIN 3.7 4.2    Recent Labs Lab 05/18/16 1944  LIPASE 20   No results for input(s): AMMONIA in the last 168 hours. Coagulation Profile: No results for input(s): INR, PROTIME in the last 168 hours. Cardiac Enzymes:  Recent Labs Lab 05/18/16 1944 05/19/16 0222 05/19/16 0800  TROPONINI <0.03 <0.03 <0.03   BNP (last 3 results) No results for input(s): PROBNP in the last 8760 hours. HbA1C: No results for input(s): HGBA1C in the last 72 hours. CBG: No results for input(s): GLUCAP in the last 168 hours. Lipid Profile: No results for input(s): CHOL, HDL, LDLCALC, TRIG, CHOLHDL, LDLDIRECT in the last 72 hours. Thyroid Function Tests: No results for input(s): TSH, T4TOTAL, FREET4, T3FREE, THYROIDAB in the last 72 hours. Anemia Panel: No results for input(s): VITAMINB12, FOLATE, FERRITIN, TIBC, IRON, RETICCTPCT in the last 72 hours. Sepsis Labs:  Recent Labs Lab 05/23/16 1659 05/23/16 2013  LATICACIDVEN 3.03* 1.76    Recent Results (from the past 240 hour(s))  Culture, blood (single)     Status: None (Preliminary result)   Collection Time: 05/23/16  5:24 PM  Result Value Ref Range Status   Specimen Description BLOOD RIGHT FOREARM  Final   Special Requests BOTTLES DRAWN AEROBIC AND ANAEROBIC 5 CC EACH  Final   Culture   Final    NO GROWTH < 24 HOURS Performed at [facility]    Report Status PENDING  Incomplete         Radiology Studies: Dg Chest 2 View  Result Date: 05/23/2016 CLINICAL DATA:  Shortness of breath, multiple myeloma on chemotherapy EXAM: CHEST  2 VIEW COMPARISON:   05/18/2016 FINDINGS: Cardiomediastinal silhouette is unremarkable. There is right IJ Port-A-Cath with tip in distal SVC. No pneumothorax. Stable expansile lesion in right clavicle. No infiltrate or pulmonary edema. IMPRESSION: No active disease.  Right IJ Port-A-Cath in place.  No pneumothorax. Electronically Signed   By: Lahoma Crocker M.D.   On: 05/23/2016 16:42   Dg Tibia/fibula Right  Result Date: 05/23/2016 CLINICAL DATA:  Pain without trauma. EXAM: RIGHT TIBIA AND FIBULA - 2 VIEW COMPARISON:  None. FINDINGS: No acute fracture or dislocation. No focal osseous lesion. No knee joint effusion. IMPRESSION: No acute osseous abnormality. Electronically Signed   By: Abigail Miyamoto M.D.   On: 05/23/2016 18:47   Ct Angio Chest Pe W And/or Wo Contrast  Result Date: 05/23/2016 CLINICAL DATA:  Shortness of breath, history of multiple myeloma EXAM: CT ANGIOGRAPHY CHEST WITH CONTRAST TECHNIQUE: Multidetector CT imaging  of the chest was performed using the standard protocol during bolus administration of intravenous contrast. Multiplanar CT image reconstructions and MIPs were obtained to evaluate the vascular anatomy. CONTRAST:  Chest x-ray 05/23/2016.  CT scan 05/18/2016 COMPARISON:  Chest x-ray 05/23/2016, CT angiogram 05/18/2016 FINDINGS: Cardiovascular: No convincing filling defects within the central or main segmental pulmonary arteries to suggest acute embolus. Thoracic aorta demonstrates no evidence for aneurysm or dissection. A right-sided vascular catheter remains in place. Heart size is nonenlarged. No pericardial effusion. Mediastinum/Nodes: No pathologically enlarged axillary, mediastinal, or hilar lymph nodes. The trachea and mainstem bronchi are within normal limits. Mild air distention of the esophagus. Mild wall thickening of the esophagus unchanged. Lungs/Pleura: Patchy posterior dependent atelectasis. Hazy lingular atelectasis. No acute infiltrate or effusion. No pulmonary nodules. No pneumothorax. Upper  Abdomen: No acute finding Musculoskeletal: Again visualized are multiple lucent lesions involving the spine, bilateral ribs, sternum, and right clavicle. Pathologic fracture at T9 again noted. Review of the MIP images confirms the above findings. IMPRESSION: 1. No definite CT evidence for acute pulmonary embolus. No CT evidence for aortic dissection. 2. No acute pulmonary infiltrate or effusion. 3. Numerous lucent lesions involving the spine, sternum, and bilateral ribs compatible with history of multiple myeloma. Pathologic fracture at T9 as previously reported. Electronically Signed   By: Donavan Foil M.D.   On: 05/23/2016 22:06        Scheduled Meds: . acetaminophen  500 mg Oral TID  . acyclovir  400 mg Oral BID  . aspirin EC  81 mg Oral Daily  . enoxaparin (LOVENOX) injection  40 mg Subcutaneous QHS  . famotidine  20 mg Oral Daily  . gabapentin  600 mg Oral TID  . [START ON 05/25/2016] Influenza vac split quadrivalent PF  0.5 mL Intramuscular Tomorrow-1000  . lisinopril  20 mg Oral Daily  . pantoprazole  40 mg Oral Daily  . polyethylene glycol  17 g Oral BID   Continuous Infusions: . sodium chloride 125 mL/hr at 05/24/16 0936     LOS: 0 days    Time spent: 71 minutes    Inda Mcglothen, MD [facility] Pager [phone]  If 7PM-7AM, please contact night-coverage [email or web address] Password [email or web address] 05/24/2016, 3:40 PM

## 2016-05-25 DIAGNOSIS — I10 Essential (primary) hypertension: Secondary | ICD-10-CM

## 2016-05-25 DIAGNOSIS — E860 Dehydration: Principal | ICD-10-CM

## 2016-05-25 MED ORDER — LISINOPRIL 20 MG PO TABS: 20.0000 mg | ORAL_TABLET | Freq: Every day | ORAL | 0 refills | Status: DC

## 2016-05-25 MED ORDER — GABAPENTIN 300 MG PO CAPS: 600.0000 mg | ORAL_CAPSULE | Freq: Three times a day (TID) | ORAL | 0 refills | Status: DC

## 2016-05-25 MED ORDER — AMLODIPINE BESYLATE 5 MG PO TABS: 5.0000 mg | ORAL_TABLET | Freq: Every day | ORAL | 0 refills | Status: DC

## 2016-05-25 MED ORDER — HYDRALAZINE HCL 20 MG/ML IJ SOLN: 5.0000 mg | Freq: Four times a day (QID) | INTRAMUSCULAR | Status: DC | PRN

## 2016-05-25 MED ORDER — AMLODIPINE BESYLATE 5 MG PO TABS: 5.0000 mg | ORAL_TABLET | Freq: Every day | ORAL | Status: DC

## 2016-05-25 MED FILL — LISINOPRIL 20 MG TABLET: 20 | 30 days supply | Qty: 30 | Fill #0

## 2016-05-25 MED FILL — AMLODIPINE BESYLATE 5 MG TA: 5 | 30 days supply | Qty: 30 | Fill #0

## 2016-05-25 NOTE — Progress Notes (Signed)
Patient d/c home. All d/c instructions, appointments,meds were discussed, verbalized understanding. No c/o pain . Stable.

## 2016-05-26 ENCOUNTER — Telehealth: Payer: Self-pay | Admitting: Hematology and Oncology

## 2016-05-26 ENCOUNTER — Ambulatory Visit (HOSPITAL_BASED_OUTPATIENT_CLINIC_OR_DEPARTMENT_OTHER): Payer: Medicaid Other | Admitting: Hematology and Oncology

## 2016-05-26 ENCOUNTER — Encounter: Payer: Self-pay | Admitting: Hematology and Oncology

## 2016-05-26 ENCOUNTER — Other Ambulatory Visit: Payer: Medicaid Other

## 2016-05-26 ENCOUNTER — Ambulatory Visit (HOSPITAL_BASED_OUTPATIENT_CLINIC_OR_DEPARTMENT_OTHER): Payer: Medicaid Other

## 2016-05-26 ENCOUNTER — Telehealth: Payer: Self-pay | Admitting: *Deleted

## 2016-05-26 VITALS — BP 154/96 | HR 88 | Temp 98.2°F | Resp 19 | Wt 166.5 lb

## 2016-05-26 DIAGNOSIS — Z5112 Encounter for antineoplastic immunotherapy: Secondary | ICD-10-CM | POA: Diagnosis not present

## 2016-05-26 DIAGNOSIS — C9000 Multiple myeloma not having achieved remission: Secondary | ICD-10-CM

## 2016-05-26 DIAGNOSIS — K209 Esophagitis, unspecified without bleeding: Secondary | ICD-10-CM

## 2016-05-26 DIAGNOSIS — G893 Neoplasm related pain (acute) (chronic): Secondary | ICD-10-CM | POA: Diagnosis not present

## 2016-05-26 DIAGNOSIS — I10 Essential (primary) hypertension: Secondary | ICD-10-CM | POA: Diagnosis not present

## 2016-05-26 DIAGNOSIS — Z5111 Encounter for antineoplastic chemotherapy: Secondary | ICD-10-CM

## 2016-05-26 DIAGNOSIS — C9001 Multiple myeloma in remission: Secondary | ICD-10-CM

## 2016-05-26 DIAGNOSIS — G620 Drug-induced polyneuropathy: Secondary | ICD-10-CM | POA: Diagnosis not present

## 2016-05-26 DIAGNOSIS — T451X5A Adverse effect of antineoplastic and immunosuppressive drugs, initial encounter: Secondary | ICD-10-CM

## 2016-05-26 LAB — URINE CULTURE: Culture: 10000 — AB

## 2016-05-26 MED ORDER — SODIUM CHLORIDE 0.9% FLUSH
10.0000 mL | INTRAVENOUS | Status: DC | PRN
  Administered 2016-05-26: 10 mL
  Filled 2016-05-26: qty 10

## 2016-05-26 MED ORDER — SODIUM CHLORIDE 0.9 % IV SOLN
10.0000 mg | Freq: Once | INTRAVENOUS | Status: AC
  Administered 2016-05-26: 10 mg via INTRAVENOUS
  Filled 2016-05-26: qty 1

## 2016-05-26 MED ORDER — PALONOSETRON HCL INJECTION 0.25 MG/5ML
0.2500 mg | Freq: Once | INTRAVENOUS | Status: AC
  Administered 2016-05-26: 0.25 mg via INTRAVENOUS

## 2016-05-26 MED ORDER — PALONOSETRON HCL INJECTION 0.25 MG/5ML
INTRAVENOUS | Status: AC
  Filled 2016-05-26: qty 5

## 2016-05-26 MED ORDER — DEXTROSE 5 % IV SOLN
36.0000 mg/m2 | Freq: Once | INTRAVENOUS | Status: AC
  Administered 2016-05-26: 68 mg via INTRAVENOUS
  Filled 2016-05-26: qty 4

## 2016-05-26 MED ORDER — RANITIDINE HCL 150 MG PO TABS: 150.0000 mg | ORAL_TABLET | Freq: Every day | ORAL | 9 refills | Status: DC

## 2016-05-26 MED ORDER — SODIUM CHLORIDE 0.9 % IV SOLN
Freq: Once | INTRAVENOUS | Status: AC
  Administered 2016-05-26: 14:00:00 via INTRAVENOUS

## 2016-05-26 MED ORDER — HEPARIN SOD (PORK) LOCK FLUSH 100 UNIT/ML IV SOLN
500.0000 [IU] | Freq: Once | INTRAVENOUS | Status: AC | PRN
Start: 2016-05-26 — End: 2016-05-26
  Administered 2016-05-26: 500 [IU]
  Filled 2016-05-26: qty 5

## 2016-05-26 MED ORDER — SODIUM CHLORIDE 0.9 % IV SOLN
300.0000 mg/m2 | Freq: Once | INTRAVENOUS | Status: AC
  Administered 2016-05-26: 560 mg via INTRAVENOUS
  Filled 2016-05-26: qty 28

## 2016-05-26 MED FILL — GABAPENTIN 300 MG CAPSULE: 300 | 15 days supply | Qty: 90 | Fill #1

## 2016-05-26 MED FILL — raNITIdine HCL 150 MG TABS: 150 | 30 days supply | Qty: 30 | Fill #0

## 2016-05-26 NOTE — Assessment & Plan Note (Signed)
The patient is adamant that he does not want to take narcotic therapy. He is currently taking tramadol as needed. We'll continue the same He is on calcium and vitamin D supplement He also takes gabapentin for neuropathic pain

## 2016-05-26 NOTE — Progress Notes (Signed)
[place] OFFICE PROGRESS NOTE  Patient Care Team: Boykin Nearing, MD as PCP - General (Family Medicine) Reola Calkins, MD as Consulting Physician (Hematology and Oncology)  SUMMARY OF ONCOLOGIC HISTORY:   Multiple myeloma ([place])   08/28/2015 Bone Marrow Biopsy    Accession: [id number] biopsy showed 58% myeloma involvement. Cytogenetics was normal and FISH showed Del 13q and t(11;14).       09/07/2015 - 01/22/2016 Chemotherapy    he received 6 cycles of treatment of Velcade and Revlimid with Dex       09/12/2015 - 09/16/2015 Hospital Admission    He was admitted to the hospital with sepsis, treatment was placed on hold and he received one dose of GCSF for leukopenia      01/12/2016 Adverse Reaction    Delay treatment due to neutropenia      01/27/2016 Imaging    ECHO at [facility] showed normal EF      01/27/2016 PET scan    PET scan at [facility] showed innumerable lytic lesions throughout      01/27/2016 Procedure    PFT is within normal limits at [facility]      01/28/2016 Bone Marrow Biopsy    Bone marrow at [facility] showed persistent plasma cell myeloma in a normocellularmarrow (30%) with 20% atypical plasma cells and myeloid hyperplasia.      02/11/2016 -  Chemotherapy    He is started on salvage Rx with Kyprolis, Cytoxan and dexamethasone      03/24/2016 Imaging    US venous Doppler showed no evidence of deep vein or superficial thrombosis involving the right lower extremity and left lower extremity. No evidence of Baker&'s cyst on the right or left.      05/18/2016 - 05/19/2016 Hospital Admission    He was admitted for chest pain evaluation. Cardiac work-up excluded cardiac events and CT angiogram were negative for PE. It showed esphageal thickening c/w reflux esophagitis likely cause for his chest discomfort      05/23/2016 - 05/25/2016 Hospital Admission    Patient was hospitalized for worsening shortness of breath and worsening pain. CT  angiogram chest negative for pulmonary emboli. Patient had a recent normal 2-D echo and ruled out for MI. Patient noted to be dehydrated. Patient admitted placed on IV fluids. Diuretic discontinued. Patient's gabapentin has been increased per recommendations from prior office visit from patient's oncologist to 600 mg 3 times daily       INTERVAL HISTORY: Please see below for problem oriented charting. He is seen before chemotherapy. He was recently admitted to the hospital due to chest pain and cardiac issue was ruled out. His chest pain was attributed to possible esophagitis and chest wall bone pain related to multiple myeloma. He was just discharged from another hospitalization yesterday after presentation with dehydration and his blood pressure regimen was modified. He denies recent dental issue. Denies recent infection. Peripheral neuropathy is about the same. His bone pain is stable.  REVIEW OF SYSTEMS:   Constitutional: Denies fevers, chills or abnormal weight loss Eyes: Denies blurriness of vision Ears, nose, mouth, throat, and face: Denies mucositis or sore throat Respiratory: Denies cough, dyspnea or wheezes Skin: Denies abnormal skin rashes Lymphatics: Denies new lymphadenopathy or easy bruising Neurological:Denies numbness, tingling or new weaknesses Behavioral/Psych: Mood is stable, no new changes  All other systems were reviewed with the patient and are negative.  I have reviewed the past medical history, past surgical history, social history and family history  with the patient and they are unchanged from previous note.  ALLERGIES:  is allergic to benadryl [diphenhydramine]; trazodone and nefazodone; and latex.  MEDICATIONS:  Current Outpatient Prescriptions  Medication Sig Dispense Refill  . acyclovir (ZOVIRAX) 400 MG tablet Take 1 tablet (400 mg total) by mouth 2 (two) times daily. 60 tablet 3  . amLODipine (NORVASC) 5 MG tablet Take 1 tablet (5 mg total) by mouth  daily. 30 tablet 0  . aspirin EC 81 MG tablet Take 81 mg by mouth daily.    . bisacodyl (DULCOLAX) 5 MG EC tablet Take 5 mg by mouth 2 (two) times daily as needed for moderate constipation.    Marland Kitchen CALCIUM PO Take 1 tablet by mouth daily.    . cholecalciferol (VITAMIN D) 1000 units tablet Take 1,000 Units by mouth daily.     Marland Kitchen dexamethasone (DECADRON) 4 MG tablet Take 5 pills every week on Thursdays with food (Patient taking differently: Take 5 tablets (20 mg total) once  Weekly on Mondays with food.) 40 tablet 4  . diclofenac sodium (VOLTAREN) 1 % GEL Apply 2 g topically 4 (four) times daily. (Patient taking differently: Apply 2 g topically 4 (four) times daily as needed (pain). ) 100 g 2  . gabapentin (NEURONTIN) 300 MG capsule Take 2 capsules (600 mg total) by mouth 3 (three) times daily. 60 capsule 0  . lidocaine-prilocaine (EMLA) cream Apply to affected area once 30 g 3  . lisinopril (PRINIVIL,ZESTRIL) 20 MG tablet Take 1 tablet (20 mg total) by mouth daily. 30 tablet 0  . pantoprazole (PROTONIX) 40 MG tablet Take 1 tablet (40 mg total) by mouth daily. 30 tablet 6  . polyethylene glycol (MIRALAX / GLYCOLAX) packet Take 17 g by mouth 2 (two) times daily. 14 each 0  . traMADol (ULTRAM) 50 MG tablet Take 1-2 tablets (50-100 mg total) by mouth every 6 (six) hours as needed. for pain 90 tablet 0  . ranitidine (ZANTAC) 150 MG tablet Take 1 tablet (150 mg total) by mouth at bedtime. 30 tablet 9  . zolpidem (AMBIEN) 10 MG tablet Take 1 tablet (10 mg total) by mouth at bedtime as needed for sleep. 30 tablet 3   No current facility-administered medications for this visit.    Facility-Administered Medications Ordered in Other Visits  Medication Dose Route Frequency Provider Last Rate Last Dose  . carfilzomib (KYPROLIS) 68 mg in dextrose 5 % 100 mL chemo infusion  36 mg/m2 (Treatment Plan Recorded) Intravenous Once Heath Lark, MD 268 mL/hr at 05/26/16 1537 68 mg at 05/26/16 1537  . heparin lock flush 100  unit/mL  500 Units Intracatheter Once PRN Heath Lark, MD      . sodium chloride flush (NS) 0.9 % injection 10 mL  10 mL Intracatheter PRN Heath Lark, MD        PHYSICAL EXAMINATION: ECOG PERFORMANCE STATUS: 1 - Symptomatic but completely ambulatory  Vitals:   05/26/16 1307  BP: (!) 154/96  Pulse: 88  Resp: 19  Temp: 98.2 F (36.8 C)   Filed Weights   05/26/16 1307  Weight: 166 lb 8 oz (75.5 kg)    GENERAL:alert, no distress and comfortable SKIN: skin color, texture, turgor are normal, no rashes or significant lesions EYES: normal, Conjunctiva are pink and non-injected, sclera clear OROPHARYNX:no exudate, no erythema and lips, buccal mucosa, and tongue normal  NECK: supple, thyroid normal size, non-tender, without nodularity LYMPH:  no palpable lymphadenopathy in the cervical, axillary or inguinal LUNGS: clear to auscultation and  percussion with normal breathing effort HEART: regular rate & rhythm and no murmurs and no lower extremity edema ABDOMEN:abdomen soft, non-tender and normal bowel sounds Musculoskeletal:no cyanosis of digits and no clubbing  NEURO: alert & oriented x 3 with fluent speech, no focal motor/sensory deficits  LABORATORY DATA:  I have reviewed the data as listed    Component Value Date/Time   NA 139 05/24/2016 0340   NA 140 05/12/2016 1234   K 3.7 05/24/2016 0340   K 3.9 05/12/2016 1234   CL 108 05/24/2016 0340   CO2 21 (L) 05/24/2016 0340   CO2 25 05/12/2016 1234   GLUCOSE 110 (H) 05/24/2016 0340   GLUCOSE 82 05/12/2016 1234   BUN 23 (H) 05/24/2016 0340   BUN 18.0 05/12/2016 1234   CREATININE 1.10 05/24/2016 0340   CREATININE 1.1 05/12/2016 1234   CALCIUM 8.2 (L) 05/24/2016 0340   CALCIUM 9.3 05/12/2016 1234   PROT 8.0 05/23/2016 1724   PROT 5.9 (L) 05/12/2016 1234   PROT 6.5 05/12/2016 1234   ALBUMIN 4.2 05/23/2016 1724   ALBUMIN 3.5 05/12/2016 1234   AST 23 05/23/2016 1724   AST 14 05/12/2016 1234   ALT 29 05/23/2016 1724   ALT 27  05/12/2016 1234   ALKPHOS 57 05/23/2016 1724   ALKPHOS 54 05/12/2016 1234   BILITOT 0.7 05/23/2016 1724   BILITOT 0.66 05/12/2016 1234   GFRNONAA >60 05/24/2016 0340   GFRNONAA 71 07/24/2015 1155   GFRAA >60 05/24/2016 0340   GFRAA 82 07/24/2015 1155    No results found for: SPEP, UPEP  Lab Results  Component Value Date   WBC 4.6 05/24/2016   NEUTROABS 4.0 05/23/2016   HGB 11.6 (L) 05/24/2016   HCT 34.0 (L) 05/24/2016   MCV 100.3 (H) 05/24/2016   PLT 269 05/24/2016      Chemistry      Component Value Date/Time   NA 139 05/24/2016 0340   NA 140 05/12/2016 1234   K 3.7 05/24/2016 0340   K 3.9 05/12/2016 1234   CL 108 05/24/2016 0340   CO2 21 (L) 05/24/2016 0340   CO2 25 05/12/2016 1234   BUN 23 (H) 05/24/2016 0340   BUN 18.0 05/12/2016 1234   CREATININE 1.10 05/24/2016 0340   CREATININE 1.1 05/12/2016 1234      Component Value Date/Time   CALCIUM 8.2 (L) 05/24/2016 0340   CALCIUM 9.3 05/12/2016 1234   ALKPHOS 57 05/23/2016 1724   ALKPHOS 54 05/12/2016 1234   AST 23 05/23/2016 1724   AST 14 05/12/2016 1234   ALT 29 05/23/2016 1724   ALT 27 05/12/2016 1234   BILITOT 0.7 05/23/2016 1724   BILITOT 0.66 05/12/2016 1234      ASSESSMENT & PLAN:  Multiple myeloma ([place]) The patient had recent recurrent admission for chest pain which was thought to be related to bone pain from multiple myeloma on his chest wall and dehydration. Overall, he tolerated chemotherapy well. His recent blood work show that the serum light chain is significantly reduced compared to his baseline several months ago and he is approaching VGPR I will proceed with one more cycle of treatment this month and plan to repeat bone marrow aspirate and biopsy on 06/15/2016. I will try to coordinate care with [facility] for transplant evaluation before Thanksgiving He will continue taking calcium with vitamin D along with Zometa. He will also continue taking acyclovir for antimicrobial  prophylaxis  Cancer associated pain The patient is adamant that he does  not want to take narcotic therapy. He is currently taking tramadol as needed. We'll continue the same He is on calcium and vitamin D supplement He also takes gabapentin for neuropathic pain  Esophagitis His recent atypical chest pain could be due to esophagitis. He will continue taking pantoprazole in the morning and Zantac at nighttime  Essential hypertension The patient was recently admitted for dehydration. His combination pill of hydrochlorothiazide/lisinopril was discontinued. His blood pressure regimen was modified to include amlodipine and lisinopril only. I recommend he takes amlodipine in the morning and lisinopril at nighttime.  Peripheral neuropathy due to chemotherapy [facility]) The patient has neuropathy from prior chemotherapy with Velcade. He will continue taking gabapentin 600 mg 3 times a day and reassess next month.   No orders of the defined types were placed in this encounter.  All questions were answered. The patient knows to call the clinic with any problems, questions or concerns. No barriers to learning was detected. I spent 25 minutes counseling the patient face to face. The total time spent in the appointment was 40 minutes and more than 50% was on counseling and review of test results     Heath Lark, MD 05/26/2016 3:59 PM

## 2016-05-26 NOTE — Assessment & Plan Note (Signed)
The patient has neuropathy from prior chemotherapy with Velcade. He will continue taking gabapentin 600 mg 3 times a day and reassess next month.

## 2016-05-26 NOTE — Assessment & Plan Note (Signed)
His recent atypical chest pain could be due to esophagitis. He will continue taking pantoprazole in the morning and Zantac at nighttime

## 2016-05-26 NOTE — Telephone Encounter (Signed)
Pt scheduled for BMBx on Wed 11/1 at Short Stay.  Butch Penny in [place] notified.  Instructed pt to arrive at 7 am,  NPO after midnight and need a driver home.  He verbalized understanding.

## 2016-05-26 NOTE — Assessment & Plan Note (Signed)
The patient had recent recurrent admission for chest pain which was thought to be related to bone pain from multiple myeloma on his chest wall and dehydration. Overall, he tolerated chemotherapy well. His recent blood work show that the serum light chain is significantly reduced compared to his baseline several months ago and he is approaching VGPR I will proceed with one more cycle of treatment this month and plan to repeat bone marrow aspirate and biopsy on 06/15/2016. I will try to coordinate care with [facility] for transplant evaluation before Thanksgiving He will continue taking calcium with vitamin D along with Zometa. He will also continue taking acyclovir for antimicrobial prophylaxis

## 2016-05-26 NOTE — Discharge Summary (Addendum)
Physician Discharge Summary  Erik Nguyen ZJI:[id number] DOB: 06-17-1956 DOA: 05/23/2016  PCP: Minerva Ends, MD  Admit date: 05/23/2016 Discharge date: 05/25/2016  Admitted From: [facility]  Disposition:  Home.   Recommendations for Outpatient Follow-up:  1. Follow up with PCP in 1-2 weeks 2. Please obtain BMP/CBC in one week 3. Please follow up on the following pending results:    Discharge Condition:stable.  CODE STATUS: full code.  Diet recommendation: Heart Healthy  Brief/Interim Summary: Patient with history of multiple myeloma, hypertension presented with worsening shortness of breath and worsening pain. Workup to date was negative. CT angiogram chest negative for pulmonary emboli. Patient had a recent normal 2-D echo and ruled out for MI. Patient noted to be dehydrated. Patient admitted placed on IV fluids. Diuretic discontinued. Patient's gabapentin has been increased per recommendations from prior office visit from patient's oncologist to 600 mg 3 times daily  Discharge Diagnoses:  Principal Problem:   Dehydration Active Problems:   HTN (hypertension)   Multiple myeloma ([place])   Hypoxia   #1 dehydration Patient with poor oral intake for several days in the context of pain. Lactic acid level improved with hydration. BUN/creatinine ratio elevated, which has improved. . No source of infection. Discontinue HCTZ.   2 hypoxia Unclear. Likely error. Repeat O2 sats with ambulation greater than 90%.  #3 multiple myeloma Patient with complaints of increased pain in his right lower extremity and chest. CT angiogram chest negative. Plain films of the right lower extremity negative for any fracture. Will increase patient's gabapentin to 600 mg 3 times daily as noted from last office visit with Dr. Simeon Craft such, hematology oncology. Will increase tramadol 200 mg when necessary. Place on scheduled Tylenol 500 mg daily. Continue acyclovir. Outpatient follow-up with Dr. Simeon Craft such.  #4  hypertension Continue lisinopril. Discontinue HCTZ secondary to dehydration.   Discharge Instructions  Discharge Instructions    Diet - low sodium heart healthy    Complete by:  As directed    Discharge instructions    Complete by:  As directed    Follow up with PCP in one week, post hospitalization, and need review of BP medications.       Medication List    STOP taking these medications   lisinopril-hydrochlorothiazide 20-25 MG tablet Commonly known as:  PRINZIDE,ZESTORETIC     TAKE these medications   acyclovir 400 MG tablet Commonly known as:  ZOVIRAX Take 1 tablet (400 mg total) by mouth 2 (two) times daily.   amLODipine 5 MG tablet Commonly known as:  NORVASC Take 1 tablet (5 mg total) by mouth daily.   aspirin EC 81 MG tablet Take 81 mg by mouth daily.   bisacodyl 5 MG EC tablet Commonly known as:  DULCOLAX Take 5 mg by mouth 2 (two) times daily as needed for moderate constipation.   CALCIUM PO Take 1 tablet by mouth daily.   cholecalciferol 1000 units tablet Commonly known as:  VITAMIN D Take 1,000 Units by mouth daily.   dexamethasone 4 MG tablet Commonly known as:  DECADRON Take 5 pills every week on Thursdays with food What changed:  additional instructions   diclofenac sodium 1 % Gel Commonly known as:  VOLTAREN Apply 2 g topically 4 (four) times daily. What changed:  when to take this  reasons to take this   gabapentin 300 MG capsule Commonly known as:  NEURONTIN Take 2 capsules (600 mg total) by mouth 3 (three) times daily. What changed:  how much to take  lidocaine-prilocaine cream Commonly known as:  EMLA Apply to affected area once   lisinopril 20 MG tablet Commonly known as:  PRINIVIL,ZESTRIL Take 1 tablet (20 mg total) by mouth daily.   pantoprazole 40 MG tablet Commonly known as:  PROTONIX Take 1 tablet (40 mg total) by mouth daily.   polyethylene glycol packet Commonly known as:  MIRALAX / GLYCOLAX Take 17 g by mouth  2 (two) times daily.   ranitidine 150 MG tablet Commonly known as:  ZANTAC Take 1 tablet (150 mg total) by mouth every morning.   traMADol 50 MG tablet Commonly known as:  ULTRAM Take 1-2 tablets (50-100 mg total) by mouth every 6 (six) hours as needed. for pain   zolpidem 10 MG tablet Commonly known as:  AMBIEN Take 1 tablet (10 mg total) by mouth at bedtime as needed for sleep.      Follow-up Information    Minerva Ends, MD. Schedule an appointment as soon as possible for a visit in 1 week(s).   Specialty:  Family Medicine Why:  post hospitalization visit. need review of bp MEDS.  Contact information: [place] [address] [phone]          Allergies  Allergen Reactions  . Benadryl [Diphenhydramine] Other (See Comments)    priapism  . Trazodone And Nefazodone     priapism  . Latex Itching and Rash    Consultations:  None.    Procedures/Studies: Dg Chest 2 View  Result Date: 05/23/2016 CLINICAL DATA:  Shortness of breath, multiple myeloma on chemotherapy EXAM: CHEST  2 VIEW COMPARISON:  05/18/2016 FINDINGS: Cardiomediastinal silhouette is unremarkable. There is right IJ Port-A-Cath with tip in distal SVC. No pneumothorax. Stable expansile lesion in right clavicle. No infiltrate or pulmonary edema. IMPRESSION: No active disease.  Right IJ Port-A-Cath in place.  No pneumothorax. Electronically Signed   By: Lahoma Crocker M.D.   On: 05/23/2016 16:42   Dg Tibia/fibula Right  Result Date: 05/23/2016 CLINICAL DATA:  Pain without trauma. EXAM: RIGHT TIBIA AND FIBULA - 2 VIEW COMPARISON:  None. FINDINGS: No acute fracture or dislocation. No focal osseous lesion. No knee joint effusion. IMPRESSION: No acute osseous abnormality. Electronically Signed   By: Abigail Miyamoto M.D.   On: 05/23/2016 18:47   Ct Angio Chest Pe W And/or Wo Contrast  Result Date: 05/23/2016 CLINICAL DATA:  Shortness of breath, history of multiple myeloma EXAM: CT ANGIOGRAPHY  CHEST WITH CONTRAST TECHNIQUE: Multidetector CT imaging of the chest was performed using the standard protocol during bolus administration of intravenous contrast. Multiplanar CT image reconstructions and MIPs were obtained to evaluate the vascular anatomy. CONTRAST:  Chest x-ray 05/23/2016.  CT scan 05/18/2016 COMPARISON:  Chest x-ray 05/23/2016, CT angiogram 05/18/2016 FINDINGS: Cardiovascular: No convincing filling defects within the central or main segmental pulmonary arteries to suggest acute embolus. Thoracic aorta demonstrates no evidence for aneurysm or dissection. A right-sided vascular catheter remains in place. Heart size is nonenlarged. No pericardial effusion. Mediastinum/Nodes: No pathologically enlarged axillary, mediastinal, or hilar lymph nodes. The trachea and mainstem bronchi are within normal limits. Mild air distention of the esophagus. Mild wall thickening of the esophagus unchanged. Lungs/Pleura: Patchy posterior dependent atelectasis. Hazy lingular atelectasis. No acute infiltrate or effusion. No pulmonary nodules. No pneumothorax. Upper Abdomen: No acute finding Musculoskeletal: Again visualized are multiple lucent lesions involving the spine, bilateral ribs, sternum, and right clavicle. Pathologic fracture at T9 again noted. Review of the MIP images confirms the above findings. IMPRESSION: 1.  No definite CT evidence for acute pulmonary embolus. No CT evidence for aortic dissection. 2. No acute pulmonary infiltrate or effusion. 3. Numerous lucent lesions involving the spine, sternum, and bilateral ribs compatible with history of multiple myeloma. Pathologic fracture at T9 as previously reported. Electronically Signed   By: Donavan Foil M.D.   On: 05/23/2016 22:06   Dg Chest Portable 1 View  Result Date: 05/18/2016 CLINICAL DATA:  Chest pain for 1 day EXAM: PORTABLE CHEST 1 VIEW COMPARISON:  03/24/2016 FINDINGS: The heart size and mediastinal contours are within normal limits. Both lungs  are clear. The visualized skeletal structures show a stable expansile lesion in the right clavicle consistent with the given clinical history of myeloma. Right chest wall port is again seen. IMPRESSION: No acute abnormality noted. Electronically Signed   By: Inez Catalina M.D.   On: 05/18/2016 20:32   Ct Angio Chest/abd/pel For Dissection W And/or Wo Contrast  Result Date: 05/18/2016 CLINICAL DATA:  Intermittent left-sided chest pain since yesterday. Relief with nitroglycerin. Hypertension. EXAM: CT ANGIOGRAPHY CHEST, ABDOMEN AND PELVIS TECHNIQUE: Multidetector CT imaging through the chest, abdomen and pelvis was performed using the standard protocol during bolus administration of intravenous contrast. Multiplanar reconstructed images and MIPs were obtained and reviewed to evaluate the vascular anatomy. CONTRAST:  125 mL Isovue 370 COMPARISON:  CT abdomen and pelvis 09/12/2015 FINDINGS: CTA CHEST FINDINGS Cardiovascular: Noncontrast images of the chest demonstrate scattered calcification in the aorta and calcification in the coronary arteries. There is a right central venous catheter with tip in the superior vena cava. Normal caliber thoracic aorta. No evidence of intramural hematoma. Gas is demonstrated in the main pulmonary artery, likely resulting from intravenous injections. Images obtained during arterial phase after dynamic injection of intravenous contrast material demonstrate normal caliber thoracic aorta with motion artifact in the aortic root. No evidence of aortic dissection or aneurysm. Great vessel origins are patent. Central pulmonary arteries are well opacified without evidence of any significant pulmonary embolus. Normal heart size. Mediastinum/Nodes: Esophagus is partially gas filled without abnormal distention. There is evidence of esophageal wall thickening. This may be due to reflux disease or esophagitis. Mediastinal lymph nodes are not pathologically enlarged. Lungs/Pleura: Atelectasis in  the lung bases, likely dependent. No focal airspace disease or consolidation. No pleural effusions. No pneumothorax. Airways appear patent. Musculoskeletal: Normal alignment of the thoracic spine. Multiple lucent lesions are demonstrated throughout multiple thoracic vertebrae, the sternum, and multiple ribs. This is consistent with diffuse multiple myeloma. There is a pathologic fracture of the T9 vertebral body, demonstrating progression since the prior CT abdomen and pelvis. Review of the MIP images confirms the above findings. CTA ABDOMEN AND PELVIS FINDINGS VASCULAR Aorta: Normal caliber aorta without aneurysm, dissection, vasculitis or significant stenosis. Scattered aortic calcifications. Celiac: Patent without evidence of aneurysm, dissection, vasculitis or significant stenosis. SMA: Patent without evidence of aneurysm, dissection, vasculitis or significant stenosis. Renals: Both renal arteries are patent without evidence of aneurysm, dissection, vasculitis, fibromuscular dysplasia or significant stenosis. IMA: Patent without evidence of aneurysm, dissection, vasculitis or significant stenosis. Inflow: Patent without evidence of aneurysm, dissection, vasculitis or significant stenosis. Veins: No obvious venous abnormality within the limitations of this arterial phase study. Filling defects demonstrated in superior mesenteric vein likely result from inflow well opacified applied. Review of the MIP images confirms the above findings. NON-VASCULAR Hepatobiliary: No focal liver abnormality is seen. No gallstones, gallbladder wall thickening, or biliary dilatation. Pancreas: Unremarkable. No pancreatic ductal dilatation or surrounding inflammatory changes. Spleen: Normal in  size without focal abnormality. Adrenals/Urinary Tract: Adrenal glands are unremarkable. Kidneys are normal, without renal calculi, focal lesion, or hydronephrosis. Bladder is unremarkable. Stomach/Bowel: Stomach is within normal limits.  Appendix appears normal. No evidence of bowel wall thickening, distention, or inflammatory changes. Lymphatic: No significant vascular findings are present. No enlarged abdominal or pelvic lymph nodes. Reproductive: Prostate is unremarkable. Other: No abdominal wall hernia or abnormality. No abdominopelvic ascites. Musculoskeletal: Normal alignment of the lumbar spine. Multiple lucent lesions throughout the lumbar vertebrae, pelvis, sacrum, and hips consistent with history of multiple myeloma. Review of the MIP images confirms the above findings. IMPRESSION: No evidence of aneurysm or dissection involving the thoracic or abdominal aorta. No evidence of significant pulmonary embolus. Esophageal wall is diffusely thickened possibly indicating reflux disease or esophagitis. Multiple lucent lesions throughout the skeleton consistent with multiple myeloma. Pathologic fracture at T9 demonstrating progression since previous study. Electronically Signed   By: Lucienne Capers M.D.   On: 05/18/2016 22:31       Subjective:  No new complaints.  Discharge Exam: Vitals:   05/25/16 0831 05/25/16 1048  BP: (!) 157/100 (!) 138/93  Pulse:    Resp:    Temp:     Vitals:   05/24/16 2150 05/25/16 0500 05/25/16 0831 05/25/16 1048  BP: 131/90 (!) 150/102 (!) 157/100 (!) 138/93  Pulse: 86 91    Resp: 18 20    Temp: 98.2 F (36.8 C) 97.7 F (36.5 C)    TempSrc: Oral Oral    SpO2: 100% 100%    Weight:      Height:        General: Pt is alert, awake, not in acute distress Cardiovascular: RRR, S1/S2 +, no rubs, no gallops Respiratory: CTA bilaterally, no wheezing, no rhonchi Abdominal: Soft, NT, ND, bowel sounds + Extremities: no edema, no cyanosis    The results of significant diagnostics from this hospitalization (including imaging, microbiology, ancillary and laboratory) are listed below for reference.     Microbiology: Recent Results (from the past 240 hour(s))  Culture, blood (single)      Status: None (Preliminary result)   Collection Time: 05/23/16  5:24 PM  Result Value Ref Range Status   Specimen Description BLOOD RIGHT FOREARM  Final   Special Requests BOTTLES DRAWN AEROBIC AND ANAEROBIC 5 CC EACH  Final   Culture   Final    NO GROWTH 2 DAYS Performed at [facility]    Report Status PENDING  Incomplete  Urine culture     Status: Abnormal   Collection Time: 05/23/16  6:00 PM  Result Value Ref Range Status   Specimen Description URINE, RANDOM  Final   Special Requests NONE  Final   Culture 10,000 COLONIES/mL CITROBACTER KOSERI (A)  Final   Report Status 05/26/2016 FINAL  Final   Organism ID, Bacteria CITROBACTER KOSERI (A)  Final      Susceptibility   Citrobacter koseri - MIC*    CEFAZOLIN <=4 SENSITIVE Sensitive     CEFTRIAXONE <=1 SENSITIVE Sensitive     CIPROFLOXACIN <=0.25 SENSITIVE Sensitive     GENTAMICIN <=1 SENSITIVE Sensitive     IMIPENEM <=0.25 SENSITIVE Sensitive     NITROFURANTOIN 32 SENSITIVE Sensitive     TRIMETH/SULFA <=20 SENSITIVE Sensitive     PIP/TAZO 16 SENSITIVE Sensitive     * 10,000 COLONIES/mL CITROBACTER KOSERI     Labs: BNP (last 3 results)  Recent Labs  02/09/16 0854  BNP 3.1   Basic Metabolic Panel:  Recent  Labs Lab 05/23/16 1724 05/24/16 0340  NA 137 139  K 3.8 3.7  CL 104 108  CO2 22 21*  GLUCOSE 122* 110*  BUN 26* 23*  CREATININE 1.19 1.10  CALCIUM 9.4 8.2*   Liver Function Tests:  Recent Labs Lab 05/23/16 1724  AST 23  ALT 29  ALKPHOS 57  BILITOT 0.7  PROT 8.0  ALBUMIN 4.2   No results for input(s): LIPASE, AMYLASE in the last 168 hours. No results for input(s): AMMONIA in the last 168 hours. CBC:  Recent Labs Lab 05/23/16 1724 05/24/16 0340  WBC 5.8 4.6  NEUTROABS 4.0  --   HGB 13.8 11.6*  HCT 39.6 34.0*  MCV 99.0 100.3*  PLT 306 269   Cardiac Enzymes: No results for input(s): CKTOTAL, CKMB, CKMBINDEX, TROPONINI in the last 168 hours. BNP: Invalid input(s): POCBNP CBG: No  results for input(s): GLUCAP in the last 168 hours. D-Dimer No results for input(s): DDIMER in the last 72 hours. Hgb A1c No results for input(s): HGBA1C in the last 72 hours. Lipid Profile No results for input(s): CHOL, HDL, LDLCALC, TRIG, CHOLHDL, LDLDIRECT in the last 72 hours. Thyroid function studies No results for input(s): TSH, T4TOTAL, T3FREE, THYROIDAB in the last 72 hours.  Invalid input(s): FREET3 Anemia work up No results for input(s): VITAMINB12, FOLATE, FERRITIN, TIBC, IRON, RETICCTPCT in the last 72 hours. Urinalysis    Component Value Date/Time   COLORURINE AMBER (A) 05/23/2016 1800   APPEARANCEUR CLEAR 05/23/2016 1800   LABSPEC 1.028 05/23/2016 1800   PHURINE 6.5 05/23/2016 1800   GLUCOSEU NEGATIVE 05/23/2016 1800   HGBUR NEGATIVE 05/23/2016 1800   BILIRUBINUR SMALL (A) 05/23/2016 1800   KETONESUR NEGATIVE 05/23/2016 1800   PROTEINUR NEGATIVE 05/23/2016 1800   UROBILINOGEN 1.0 12/28/2014 2005   NITRITE NEGATIVE 05/23/2016 1800   LEUKOCYTESUR NEGATIVE 05/23/2016 1800   Sepsis Labs Invalid input(s): PROCALCITONIN,  WBC,  LACTICIDVEN Microbiology Recent Results (from the past 240 hour(s))  Culture, blood (single)     Status: None (Preliminary result)   Collection Time: 05/23/16  5:24 PM  Result Value Ref Range Status   Specimen Description BLOOD RIGHT FOREARM  Final   Special Requests BOTTLES DRAWN AEROBIC AND ANAEROBIC 5 CC EACH  Final   Culture   Final    NO GROWTH 2 DAYS Performed at [facility]    Report Status PENDING  Incomplete  Urine culture     Status: Abnormal   Collection Time: 05/23/16  6:00 PM  Result Value Ref Range Status   Specimen Description URINE, RANDOM  Final   Special Requests NONE  Final   Culture 10,000 COLONIES/mL CITROBACTER KOSERI (A)  Final   Report Status 05/26/2016 FINAL  Final   Organism ID, Bacteria CITROBACTER KOSERI (A)  Final      Susceptibility   Citrobacter koseri - MIC*    CEFAZOLIN <=4 SENSITIVE  Sensitive     CEFTRIAXONE <=1 SENSITIVE Sensitive     CIPROFLOXACIN <=0.25 SENSITIVE Sensitive     GENTAMICIN <=1 SENSITIVE Sensitive     IMIPENEM <=0.25 SENSITIVE Sensitive     NITROFURANTOIN 32 SENSITIVE Sensitive     TRIMETH/SULFA <=20 SENSITIVE Sensitive     PIP/TAZO 16 SENSITIVE Sensitive     * 10,000 COLONIES/mL CITROBACTER KOSERI     Time coordinating discharge: Over 30 minutes  SIGNED:   Hosie Poisson, MD  [facility] 05/26/2016, 9:24 AM Pager   If 7PM-7AM, please contact night-coverage [email or web address] Password [email or web address]

## 2016-05-26 NOTE — Telephone Encounter (Signed)
Gv pt appts for Nov 2017.

## 2016-05-26 NOTE — Patient Instructions (Signed)
[facility] Discharge Instructions for Patients Receiving Chemotherapy  Today you received the following chemotherapy agents Cytoxan and Kyprolis. To help prevent nausea and vomiting after your treatment, we encourage you to take your nausea medication as prescribed.   If you develop nausea and vomiting that is not controlled by your nausea medication, call the clinic.   BELOW ARE SYMPTOMS THAT SHOULD BE REPORTED IMMEDIATELY:  *FEVER GREATER THAN 100.5 F  *CHILLS WITH OR WITHOUT FEVER  NAUSEA AND VOMITING THAT IS NOT CONTROLLED WITH YOUR NAUSEA MEDICATION  *UNUSUAL SHORTNESS OF BREATH  *UNUSUAL BRUISING OR BLEEDING  TENDERNESS IN MOUTH AND THROAT WITH OR WITHOUT PRESENCE OF ULCERS  *URINARY PROBLEMS  *BOWEL PROBLEMS  UNUSUAL RASH Items with * indicate a potential emergency and should be followed up as soon as possible.  Feel free to call the clinic you have any questions or concerns. The clinic phone number is [phone].  Please show the CHEMO ALERT CARD at check-in to the Emergency Department and triage nurse.   

## 2016-05-26 NOTE — Assessment & Plan Note (Signed)
The patient was recently admitted for dehydration. His combination pill of hydrochlorothiazide/lisinopril was discontinued. His blood pressure regimen was modified to include amlodipine and lisinopril only. I recommend he takes amlodipine in the morning and lisinopril at nighttime.

## 2016-05-27 ENCOUNTER — Ambulatory Visit (HOSPITAL_BASED_OUTPATIENT_CLINIC_OR_DEPARTMENT_OTHER): Payer: Medicaid Other

## 2016-05-27 VITALS — BP 142/95 | HR 94 | Temp 98.7°F | Resp 18

## 2016-05-27 DIAGNOSIS — Z5112 Encounter for antineoplastic immunotherapy: Secondary | ICD-10-CM | POA: Diagnosis present

## 2016-05-27 DIAGNOSIS — C9000 Multiple myeloma not having achieved remission: Secondary | ICD-10-CM | POA: Diagnosis not present

## 2016-05-27 MED ORDER — SODIUM CHLORIDE 0.9 % IV SOLN
Freq: Once | INTRAVENOUS | Status: AC
  Administered 2016-05-27: 13:00:00 via INTRAVENOUS

## 2016-05-27 MED ORDER — DEXTROSE 5 % IV SOLN
36.0000 mg/m2 | Freq: Once | INTRAVENOUS | Status: AC
  Administered 2016-05-27: 68 mg via INTRAVENOUS
  Filled 2016-05-27: qty 30

## 2016-05-27 MED ORDER — SODIUM CHLORIDE 0.9% FLUSH
10.0000 mL | INTRAVENOUS | Status: DC | PRN
  Administered 2016-05-27: 10 mL
  Filled 2016-05-27: qty 10

## 2016-05-27 MED ORDER — HEPARIN SOD (PORK) LOCK FLUSH 100 UNIT/ML IV SOLN
500.0000 [IU] | Freq: Once | INTRAVENOUS | Status: AC | PRN
  Administered 2016-05-27: 500 [IU]
  Filled 2016-05-27: qty 5

## 2016-05-27 MED ORDER — SODIUM CHLORIDE 0.9 % IV SOLN: Freq: Once | INTRAVENOUS | Status: DC

## 2016-05-27 MED ORDER — SODIUM CHLORIDE 0.9 % IV SOLN
10.0000 mg | Freq: Once | INTRAVENOUS | Status: AC
  Administered 2016-05-27: 10 mg via INTRAVENOUS
  Filled 2016-05-27: qty 1

## 2016-05-27 NOTE — Patient Instructions (Signed)
[facility] Discharge Instructions for Patients Receiving Chemotherapy  Today you received the following chemotherapy agents: Kyprolis   To help prevent nausea and vomiting after your treatment, we encourage you to take your nausea medication as directed.    If you develop nausea and vomiting that is not controlled by your nausea medication, call the clinic.   BELOW ARE SYMPTOMS THAT SHOULD BE REPORTED IMMEDIATELY:  *FEVER GREATER THAN 100.5 F  *CHILLS WITH OR WITHOUT FEVER  NAUSEA AND VOMITING THAT IS NOT CONTROLLED WITH YOUR NAUSEA MEDICATION  *UNUSUAL SHORTNESS OF BREATH  *UNUSUAL BRUISING OR BLEEDING  TENDERNESS IN MOUTH AND THROAT WITH OR WITHOUT PRESENCE OF ULCERS  *URINARY PROBLEMS  *BOWEL PROBLEMS  UNUSUAL RASH Items with * indicate a potential emergency and should be followed up as soon as possible.  Feel free to call the clinic you have any questions or concerns. The clinic phone number is [phone].  Please show the CHEMO ALERT CARD at check-in to the Emergency Department and triage nurse.   

## 2016-05-28 LAB — CULTURE, BLOOD (SINGLE): CULTURE: NO GROWTH

## 2016-06-02 ENCOUNTER — Other Ambulatory Visit: Payer: Medicaid Other

## 2016-06-02 ENCOUNTER — Telehealth: Payer: Self-pay | Admitting: *Deleted

## 2016-06-02 ENCOUNTER — Ambulatory Visit (HOSPITAL_BASED_OUTPATIENT_CLINIC_OR_DEPARTMENT_OTHER): Payer: Medicaid Other

## 2016-06-02 ENCOUNTER — Other Ambulatory Visit (HOSPITAL_BASED_OUTPATIENT_CLINIC_OR_DEPARTMENT_OTHER): Payer: Medicaid Other

## 2016-06-02 ENCOUNTER — Other Ambulatory Visit: Payer: Self-pay | Admitting: Hematology and Oncology

## 2016-06-02 VITALS — BP 111/84 | HR 92 | Temp 98.3°F | Resp 18

## 2016-06-02 DIAGNOSIS — Z5111 Encounter for antineoplastic chemotherapy: Secondary | ICD-10-CM

## 2016-06-02 DIAGNOSIS — C9000 Multiple myeloma not having achieved remission: Secondary | ICD-10-CM | POA: Diagnosis not present

## 2016-06-02 DIAGNOSIS — Z5112 Encounter for antineoplastic immunotherapy: Secondary | ICD-10-CM | POA: Diagnosis present

## 2016-06-02 DIAGNOSIS — C9001 Multiple myeloma in remission: Secondary | ICD-10-CM

## 2016-06-02 LAB — COMPREHENSIVE METABOLIC PANEL
ALT: 32 U/L (ref 0–55)
ANION GAP: 9 meq/L (ref 3–11)
AST: 17 U/L (ref 5–34)
Albumin: 3.2 g/dL — ABNORMAL LOW (ref 3.5–5.0)
Alkaline Phosphatase: 53 U/L (ref 40–150)
BUN: 13.1 mg/dL (ref 7.0–26.0)
CHLORIDE: 107 meq/L (ref 98–109)
CO2: 25 meq/L (ref 22–29)
Calcium: 8.8 mg/dL (ref 8.4–10.4)
Creatinine: 0.9 mg/dL (ref 0.7–1.3)
Glucose: 205 mg/dl — ABNORMAL HIGH (ref 70–140)
POTASSIUM: 3.7 meq/L (ref 3.5–5.1)
Sodium: 142 mEq/L (ref 136–145)
Total Bilirubin: 0.27 mg/dL (ref 0.20–1.20)
Total Protein: 5.9 g/dL — ABNORMAL LOW (ref 6.4–8.3)

## 2016-06-02 LAB — CBC WITH DIFFERENTIAL/PLATELET
BASO%: 0.2 % (ref 0.0–2.0)
BASOS ABS: 0 10*3/uL (ref 0.0–0.1)
EOS%: 3 % (ref 0.0–7.0)
Eosinophils Absolute: 0.2 10*3/uL (ref 0.0–0.5)
HCT: 33.4 % — ABNORMAL LOW (ref 38.4–49.9)
HGB: 11.4 g/dL — ABNORMAL LOW (ref 13.0–17.1)
LYMPH%: 17.5 % (ref 14.0–49.0)
MCH: 34.4 pg — AB (ref 27.2–33.4)
MCHC: 34.1 g/dL (ref 32.0–36.0)
MCV: 100.9 fL — ABNORMAL HIGH (ref 79.3–98.0)
MONO#: 0.6 10*3/uL (ref 0.1–0.9)
MONO%: 10.3 % (ref 0.0–14.0)
NEUT#: 4.3 10*3/uL (ref 1.5–6.5)
NEUT%: 69 % (ref 39.0–75.0)
PLATELETS: 200 10*3/uL (ref 140–400)
RBC: 3.31 10*6/uL — AB (ref 4.20–5.82)
RDW: 14.1 % (ref 11.0–14.6)
WBC: 6.2 10*3/uL (ref 4.0–10.3)
lymph#: 1.1 10*3/uL (ref 0.9–3.3)

## 2016-06-02 MED ORDER — PALONOSETRON HCL INJECTION 0.25 MG/5ML
INTRAVENOUS | Status: AC
Start: 2016-06-02 — End: 2016-06-02
  Filled 2016-06-02: qty 5

## 2016-06-02 MED ORDER — SODIUM CHLORIDE 0.9 % IV SOLN: 10.0000 mg | Freq: Once | INTRAVENOUS | Status: DC

## 2016-06-02 MED ORDER — SODIUM CHLORIDE 0.9 % IV SOLN
300.0000 mg/m2 | Freq: Once | INTRAVENOUS | Status: AC
  Administered 2016-06-02: 560 mg via INTRAVENOUS
  Filled 2016-06-02: qty 28

## 2016-06-02 MED ORDER — SODIUM CHLORIDE 0.9 % IV SOLN
Freq: Once | INTRAVENOUS | Status: AC
  Administered 2016-06-02: 11:00:00 via INTRAVENOUS

## 2016-06-02 MED ORDER — SODIUM CHLORIDE 0.9 % IV SOLN
Freq: Once | INTRAVENOUS | Status: AC
  Administered 2016-06-02: 12:00:00 via INTRAVENOUS

## 2016-06-02 MED ORDER — DEXTROSE 5 % IV SOLN
36.0000 mg/m2 | Freq: Once | INTRAVENOUS | Status: AC
  Administered 2016-06-02: 68 mg via INTRAVENOUS
  Filled 2016-06-02: qty 19

## 2016-06-02 MED ORDER — PALONOSETRON HCL INJECTION 0.25 MG/5ML
0.2500 mg | Freq: Once | INTRAVENOUS | Status: AC
Start: 2016-06-02 — End: 2016-06-02
  Administered 2016-06-02: 0.25 mg via INTRAVENOUS

## 2016-06-02 MED ORDER — SODIUM CHLORIDE 0.9% FLUSH
10.0000 mL | INTRAVENOUS | Status: DC | PRN
  Administered 2016-06-02: 10 mL
  Filled 2016-06-02: qty 10

## 2016-06-02 MED ORDER — HEPARIN SOD (PORK) LOCK FLUSH 100 UNIT/ML IV SOLN
500.0000 [IU] | Freq: Once | INTRAVENOUS | Status: AC | PRN
  Administered 2016-06-02: 500 [IU]
  Filled 2016-06-02: qty 5

## 2016-06-02 MED ORDER — DEXAMETHASONE SODIUM PHOSPHATE 10 MG/ML IJ SOLN
10.0000 mg | Freq: Once | INTRAMUSCULAR | Status: AC
  Administered 2016-06-02: 10 mg via INTRAVENOUS

## 2016-06-02 MED ORDER — DEXAMETHASONE SODIUM PHOSPHATE 10 MG/ML IJ SOLN
INTRAMUSCULAR | Status: AC
  Filled 2016-06-02: qty 1

## 2016-06-02 NOTE — Telephone Encounter (Signed)
I am planning to order labs and bone marrow. If we need 24 hour urine collection, then ask him to go to the lab and collect the jug tomorrow and start collecting the day before bone marrow biopsy and bring it in on the day of biopsy

## 2016-06-02 NOTE — Telephone Encounter (Signed)
-----   Message from Heath Lark, MD sent at 05/26/2016  3:45 PM EDT ----- Regarding: [facility] He will completed his last rx 10/27 and I plan to do BM biopsy 11/1 I am seeing him back 11/9 to review results. Assuming results are good, can Dr. Norma Fredrickson see him before Thanksgiving to discuss transplant? Patient is leaving town 10/21 for thanksgiving but would like to see Dr. Norma Fredrickson before he leaves Please update transplant coordinator of plan above

## 2016-06-02 NOTE — Telephone Encounter (Signed)
-----   Message from Heath Lark, MD sent at 05/26/2016  3:45 PM EDT ----- Regarding: [facility] He will completed his last rx 10/27 and I plan to do BM biopsy 11/1 I am seeing him back 11/9 to review results. Assuming results are good, can Dr. Norma Fredrickson see him before Thanksgiving to discuss transplant? Patient is leaving town 10/21 for thanksgiving but would like to see Dr. Norma Fredrickson before he leaves Please update transplant coordinator of plan above

## 2016-06-02 NOTE — Telephone Encounter (Signed)
Notified Urban Gibson, BMT Coordinator at [facility], of Dr. Calton Dach note.   Wells Guiles will request appt for pt w/ Dr. Norma Fredrickson on 11/13 at 8:30 am.   She asks if Dr. Alvy Bimler will order the Myeloma blood work and 24 hr urine to be done here before his appt w/ Dr. Norma Fredrickson?

## 2016-06-02 NOTE — Patient Instructions (Signed)
[facility] Discharge Instructions for Patients Receiving Chemotherapy  Today you received the following chemotherapy agents:  Kyprolis and Cytoxan.  To help prevent nausea and vomiting after your treatment, we encourage you to take your nausea medication as directed.   If you develop nausea and vomiting that is not controlled by your nausea medication, call the clinic.   BELOW ARE SYMPTOMS THAT SHOULD BE REPORTED IMMEDIATELY:  *FEVER GREATER THAN 100.5 F  *CHILLS WITH OR WITHOUT FEVER  NAUSEA AND VOMITING THAT IS NOT CONTROLLED WITH YOUR NAUSEA MEDICATION  *UNUSUAL SHORTNESS OF BREATH  *UNUSUAL BRUISING OR BLEEDING  TENDERNESS IN MOUTH AND THROAT WITH OR WITHOUT PRESENCE OF ULCERS  *URINARY PROBLEMS  *BOWEL PROBLEMS  UNUSUAL RASH Items with * indicate a potential emergency and should be followed up as soon as possible.  Feel free to call the clinic you have any questions or concerns. The clinic phone number is [phone].  Please show the CHEMO ALERT CARD at check-in to the Emergency Department and triage nurse.   

## 2016-06-03 ENCOUNTER — Telehealth: Payer: Self-pay | Admitting: *Deleted

## 2016-06-03 ENCOUNTER — Encounter: Payer: Self-pay | Admitting: Pharmacist

## 2016-06-03 ENCOUNTER — Ambulatory Visit (HOSPITAL_BASED_OUTPATIENT_CLINIC_OR_DEPARTMENT_OTHER): Payer: Medicaid Other

## 2016-06-03 VITALS — BP 158/97 | HR 95 | Temp 98.8°F | Resp 17

## 2016-06-03 DIAGNOSIS — Z5112 Encounter for antineoplastic immunotherapy: Secondary | ICD-10-CM | POA: Diagnosis not present

## 2016-06-03 DIAGNOSIS — C9000 Multiple myeloma not having achieved remission: Secondary | ICD-10-CM

## 2016-06-03 MED ORDER — HEPARIN SOD (PORK) LOCK FLUSH 100 UNIT/ML IV SOLN
500.0000 [IU] | Freq: Once | INTRAVENOUS | Status: AC | PRN
  Administered 2016-06-03: 500 [IU]
  Filled 2016-06-03: qty 5

## 2016-06-03 MED ORDER — DEXAMETHASONE SODIUM PHOSPHATE 10 MG/ML IJ SOLN
10.0000 mg | Freq: Once | INTRAMUSCULAR | Status: AC
  Administered 2016-06-03: 10 mg via INTRAVENOUS

## 2016-06-03 MED ORDER — SODIUM CHLORIDE 0.9 % IV SOLN
Freq: Once | INTRAVENOUS | Status: AC
  Administered 2016-06-03: 10:00:00 via INTRAVENOUS

## 2016-06-03 MED ORDER — SODIUM CHLORIDE 0.9% FLUSH
10.0000 mL | INTRAVENOUS | Status: DC | PRN
  Administered 2016-06-03: 10 mL
  Filled 2016-06-03: qty 10

## 2016-06-03 MED ORDER — DEXTROSE 5 % IV SOLN
36.0000 mg/m2 | Freq: Once | INTRAVENOUS | Status: AC
  Administered 2016-06-03: 68 mg via INTRAVENOUS
  Filled 2016-06-03: qty 30

## 2016-06-03 MED ORDER — DEXAMETHASONE SODIUM PHOSPHATE 10 MG/ML IJ SOLN
INTRAMUSCULAR | Status: AC
  Filled 2016-06-03: qty 1

## 2016-06-03 MED ORDER — SODIUM CHLORIDE 0.9 % IV SOLN
Freq: Once | INTRAVENOUS | Status: AC
  Administered 2016-06-03: 09:00:00 via INTRAVENOUS

## 2016-06-03 NOTE — Telephone Encounter (Signed)
S/w pt in Infusion Room.  Gave him urine Jug for 24 hr urine collection w/ instructions to start morning of 10/31 and complete morning of 11/01.  Bring w/ him on 11/01 when he comes for BMBX.   Informed him of appt to be made at [facility] for 11/13.  They will call him w/ appt.Erik Nguyen He verbalized understanding.

## 2016-06-03 NOTE — Patient Instructions (Signed)
[facility] Discharge Instructions for Patients Receiving Chemotherapy  Today you received the following chemotherapy agents: Kyprolis   To help prevent nausea and vomiting after your treatment, we encourage you to take your nausea medication as directed.    If you develop nausea and vomiting that is not controlled by your nausea medication, call the clinic.   BELOW ARE SYMPTOMS THAT SHOULD BE REPORTED IMMEDIATELY:  *FEVER GREATER THAN 100.5 F  *CHILLS WITH OR WITHOUT FEVER  NAUSEA AND VOMITING THAT IS NOT CONTROLLED WITH YOUR NAUSEA MEDICATION  *UNUSUAL SHORTNESS OF BREATH  *UNUSUAL BRUISING OR BLEEDING  TENDERNESS IN MOUTH AND THROAT WITH OR WITHOUT PRESENCE OF ULCERS  *URINARY PROBLEMS  *BOWEL PROBLEMS  UNUSUAL RASH Items with * indicate a potential emergency and should be followed up as soon as possible.  Feel free to call the clinic you have any questions or concerns. The clinic phone number is [phone].  Please show the CHEMO ALERT CARD at check-in to the Emergency Department and triage nurse.   

## 2016-06-09 ENCOUNTER — Other Ambulatory Visit (HOSPITAL_BASED_OUTPATIENT_CLINIC_OR_DEPARTMENT_OTHER): Payer: Medicaid Other

## 2016-06-09 ENCOUNTER — Ambulatory Visit (HOSPITAL_BASED_OUTPATIENT_CLINIC_OR_DEPARTMENT_OTHER): Payer: Medicaid Other

## 2016-06-09 ENCOUNTER — Ambulatory Visit: Payer: Medicaid Other

## 2016-06-09 VITALS — BP 122/85 | HR 100 | Temp 98.3°F | Resp 17

## 2016-06-09 DIAGNOSIS — Z5112 Encounter for antineoplastic immunotherapy: Secondary | ICD-10-CM | POA: Diagnosis present

## 2016-06-09 DIAGNOSIS — Z5111 Encounter for antineoplastic chemotherapy: Secondary | ICD-10-CM

## 2016-06-09 DIAGNOSIS — C9000 Multiple myeloma not having achieved remission: Secondary | ICD-10-CM

## 2016-06-09 DIAGNOSIS — C9001 Multiple myeloma in remission: Secondary | ICD-10-CM

## 2016-06-09 DIAGNOSIS — Z95828 Presence of other vascular implants and grafts: Secondary | ICD-10-CM

## 2016-06-09 LAB — CBC WITH DIFFERENTIAL/PLATELET
BASO%: 0.3 % (ref 0.0–2.0)
BASOS ABS: 0 10*3/uL (ref 0.0–0.1)
EOS ABS: 0.1 10*3/uL (ref 0.0–0.5)
EOS%: 0.9 % (ref 0.0–7.0)
HEMATOCRIT: 37.9 % — AB (ref 38.4–49.9)
HGB: 12.5 g/dL — ABNORMAL LOW (ref 13.0–17.1)
LYMPH%: 14.1 % (ref 14.0–49.0)
MCH: 33.8 pg — AB (ref 27.2–33.4)
MCHC: 33.1 g/dL (ref 32.0–36.0)
MCV: 102 fL — AB (ref 79.3–98.0)
MONO#: 1 10*3/uL — AB (ref 0.1–0.9)
MONO%: 11.9 % (ref 0.0–14.0)
NEUT#: 6.4 10*3/uL (ref 1.5–6.5)
NEUT%: 72.8 % (ref 39.0–75.0)
PLATELETS: 207 10*3/uL (ref 140–400)
RBC: 3.71 10*6/uL — ABNORMAL LOW (ref 4.20–5.82)
RDW: 15.1 % — ABNORMAL HIGH (ref 11.0–14.6)
WBC: 8.8 10*3/uL (ref 4.0–10.3)
lymph#: 1.2 10*3/uL (ref 0.9–3.3)

## 2016-06-09 LAB — COMPREHENSIVE METABOLIC PANEL
ALT: 73 U/L — AB (ref 0–55)
ANION GAP: 8 meq/L (ref 3–11)
AST: 22 U/L (ref 5–34)
Albumin: 3.4 g/dL — ABNORMAL LOW (ref 3.5–5.0)
Alkaline Phosphatase: 57 U/L (ref 40–150)
BUN: 17.7 mg/dL (ref 7.0–26.0)
CALCIUM: 9.2 mg/dL (ref 8.4–10.4)
CHLORIDE: 109 meq/L (ref 98–109)
CO2: 26 mEq/L (ref 22–29)
CREATININE: 1 mg/dL (ref 0.7–1.3)
EGFR: >90 mL/min/{1.73_m2} (ref >90)
Glucose: 120 mg/dl (ref 70–140)
Potassium: 3.8 mEq/L (ref 3.5–5.1)
Sodium: 144 mEq/L (ref 136–145)
Total Bilirubin: 0.45 mg/dL (ref 0.20–1.20)
Total Protein: 6.4 g/dL (ref 6.4–8.3)

## 2016-06-09 LAB — TECHNOLOGIST REVIEW

## 2016-06-09 MED ORDER — SODIUM CHLORIDE 0.9% FLUSH
10.0000 mL | INTRAVENOUS | Status: DC | PRN
  Administered 2016-06-09: 10 mL
  Filled 2016-06-09: qty 10

## 2016-06-09 MED ORDER — DEXAMETHASONE SODIUM PHOSPHATE 10 MG/ML IJ SOLN
INTRAMUSCULAR | Status: AC
  Filled 2016-06-09: qty 1

## 2016-06-09 MED ORDER — DEXTROSE 5 % IV SOLN
36.0000 mg/m2 | Freq: Once | INTRAVENOUS | Status: AC
  Administered 2016-06-09: 68 mg via INTRAVENOUS
  Filled 2016-06-09: qty 30

## 2016-06-09 MED ORDER — SODIUM CHLORIDE 0.9 % IV SOLN
Freq: Once | INTRAVENOUS | Status: AC
  Administered 2016-06-09: 13:00:00 via INTRAVENOUS

## 2016-06-09 MED ORDER — PALONOSETRON HCL INJECTION 0.25 MG/5ML
INTRAVENOUS | Status: AC
  Filled 2016-06-09: qty 5

## 2016-06-09 MED ORDER — SODIUM CHLORIDE 0.9 % IV SOLN: Freq: Once | INTRAVENOUS | Status: DC

## 2016-06-09 MED ORDER — SODIUM CHLORIDE 0.9 % IJ SOLN
10.0000 mL | INTRAMUSCULAR | Status: DC | PRN
  Administered 2016-06-09: 10 mL via INTRAVENOUS
  Filled 2016-06-09: qty 10

## 2016-06-09 MED ORDER — SODIUM CHLORIDE 0.9 % IV SOLN
300.0000 mg/m2 | Freq: Once | INTRAVENOUS | Status: AC
  Administered 2016-06-09: 560 mg via INTRAVENOUS
  Filled 2016-06-09: qty 28

## 2016-06-09 MED ORDER — HEPARIN SOD (PORK) LOCK FLUSH 100 UNIT/ML IV SOLN
500.0000 [IU] | Freq: Once | INTRAVENOUS | Status: AC | PRN
  Administered 2016-06-09: 500 [IU]
  Filled 2016-06-09: qty 5

## 2016-06-09 MED ORDER — PALONOSETRON HCL INJECTION 0.25 MG/5ML
0.2500 mg | Freq: Once | INTRAVENOUS | Status: AC
  Administered 2016-06-09: 0.25 mg via INTRAVENOUS

## 2016-06-09 MED ORDER — DEXAMETHASONE SODIUM PHOSPHATE 10 MG/ML IJ SOLN
10.0000 mg | Freq: Once | INTRAMUSCULAR | Status: AC
  Administered 2016-06-09: 10 mg via INTRAVENOUS

## 2016-06-09 NOTE — Patient Instructions (Signed)
[facility] Discharge Instructions for Patients Receiving Chemotherapy  Today you received the following chemotherapy agents :  Kyprolis,  Cytoxan.  To help prevent nausea and vomiting after your treatment, we encourage you to take your nausea medication as prescribed.   If you develop nausea and vomiting that is not controlled by your nausea medication, call the clinic.   BELOW ARE SYMPTOMS THAT SHOULD BE REPORTED IMMEDIATELY:  *FEVER GREATER THAN 100.5 F  *CHILLS WITH OR WITHOUT FEVER  NAUSEA AND VOMITING THAT IS NOT CONTROLLED WITH YOUR NAUSEA MEDICATION  *UNUSUAL SHORTNESS OF BREATH  *UNUSUAL BRUISING OR BLEEDING  TENDERNESS IN MOUTH AND THROAT WITH OR WITHOUT PRESENCE OF ULCERS  *URINARY PROBLEMS  *BOWEL PROBLEMS  UNUSUAL RASH Items with * indicate a potential emergency and should be followed up as soon as possible.  Feel free to call the clinic you have any questions or concerns. The clinic phone number is [phone].  Please show the CHEMO ALERT CARD at check-in to the Emergency Department and triage nurse.   

## 2016-06-10 ENCOUNTER — Ambulatory Visit (HOSPITAL_BASED_OUTPATIENT_CLINIC_OR_DEPARTMENT_OTHER): Payer: Medicaid Other

## 2016-06-10 VITALS — BP 154/96 | HR 89 | Temp 98.6°F | Resp 16

## 2016-06-10 DIAGNOSIS — C9000 Multiple myeloma not having achieved remission: Secondary | ICD-10-CM

## 2016-06-10 DIAGNOSIS — Z5112 Encounter for antineoplastic immunotherapy: Secondary | ICD-10-CM

## 2016-06-10 MED ORDER — DEXAMETHASONE SODIUM PHOSPHATE 10 MG/ML IJ SOLN
10.0000 mg | Freq: Once | INTRAMUSCULAR | Status: AC
  Administered 2016-06-10: 10 mg via INTRAVENOUS

## 2016-06-10 MED ORDER — SODIUM CHLORIDE 0.9 % IV SOLN
Freq: Once | INTRAVENOUS | Status: AC
  Administered 2016-06-10: 11:00:00 via INTRAVENOUS

## 2016-06-10 MED ORDER — DEXAMETHASONE SODIUM PHOSPHATE 10 MG/ML IJ SOLN
INTRAMUSCULAR | Status: AC
  Filled 2016-06-10: qty 1

## 2016-06-10 MED ORDER — SODIUM CHLORIDE 0.9 % IV SOLN: Freq: Once | INTRAVENOUS | Status: DC

## 2016-06-10 MED ORDER — DEXTROSE 5 % IV SOLN
36.0000 mg/m2 | Freq: Once | INTRAVENOUS | Status: AC
  Administered 2016-06-10: 68 mg via INTRAVENOUS
  Filled 2016-06-10: qty 30

## 2016-06-10 MED ORDER — HEPARIN SOD (PORK) LOCK FLUSH 100 UNIT/ML IV SOLN
500.0000 [IU] | Freq: Once | INTRAVENOUS | Status: AC | PRN
  Administered 2016-06-10: 500 [IU]
  Filled 2016-06-10: qty 5

## 2016-06-10 MED ORDER — SODIUM CHLORIDE 0.9% FLUSH
10.0000 mL | INTRAVENOUS | Status: DC | PRN
  Administered 2016-06-10: 10 mL
  Filled 2016-06-10: qty 10

## 2016-06-10 MED FILL — GABAPENTIN 300 MG CAPSULE: 300 | 15 days supply | Qty: 90 | Fill #2

## 2016-06-10 MED FILL — PANTOPRAZOLE SOD DR 40 MG T: 40 | 30 days supply | Qty: 30 | Fill #3

## 2016-06-10 NOTE — Patient Instructions (Signed)
[facility] Discharge Instructions for Patients Receiving Chemotherapy  Today you received the following chemotherapy agents: Kyprolis   To help prevent nausea and vomiting after your treatment, we encourage you to take your nausea medication as directed.    If you develop nausea and vomiting that is not controlled by your nausea medication, call the clinic.   BELOW ARE SYMPTOMS THAT SHOULD BE REPORTED IMMEDIATELY:  *FEVER GREATER THAN 100.5 F  *CHILLS WITH OR WITHOUT FEVER  NAUSEA AND VOMITING THAT IS NOT CONTROLLED WITH YOUR NAUSEA MEDICATION  *UNUSUAL SHORTNESS OF BREATH  *UNUSUAL BRUISING OR BLEEDING  TENDERNESS IN MOUTH AND THROAT WITH OR WITHOUT PRESENCE OF ULCERS  *URINARY PROBLEMS  *BOWEL PROBLEMS  UNUSUAL RASH Items with * indicate a potential emergency and should be followed up as soon as possible.  Feel free to call the clinic you have any questions or concerns. The clinic phone number is [phone].  Please show the CHEMO ALERT CARD at check-in to the Emergency Department and triage nurse.   

## 2016-06-15 ENCOUNTER — Ambulatory Visit (HOSPITAL_COMMUNITY)
Admission: RE | Admit: 2016-06-15 | Discharge: 2016-06-15 | Disposition: A | Discharge status: Home / Self Care | Payer: Medicaid Other | Source: Ambulatory Visit | Attending: Hematology and Oncology | Admitting: Hematology and Oncology

## 2016-06-15 ENCOUNTER — Encounter (HOSPITAL_COMMUNITY): Payer: Self-pay

## 2016-06-15 ENCOUNTER — Ambulatory Visit: Payer: Medicaid Other

## 2016-06-15 DIAGNOSIS — C9000 Multiple myeloma not having achieved remission: Secondary | ICD-10-CM

## 2016-06-15 DIAGNOSIS — C9001 Multiple myeloma in remission: Secondary | ICD-10-CM | POA: Insufficient documentation

## 2016-06-15 LAB — COMPREHENSIVE METABOLIC PANEL
ALT: 42 U/L (ref 17–63)
AST: 19 U/L (ref 15–41)
Albumin: 3.7 g/dL (ref 3.5–5.0)
Alkaline Phosphatase: 39 U/L (ref 38–126)
Anion gap: 8 (ref 5–15)
BUN: 20 mg/dL (ref 6–20)
CO2: 23 mmol/L (ref 22–32)
Calcium: 8.8 mg/dL — ABNORMAL LOW (ref 8.9–10.3)
Chloride: 109 mmol/L (ref 101–111)
Creatinine, Ser: 0.86 mg/dL (ref 0.61–1.24)
GFR calc Af Amer: >60 mL/min (ref >60)
GFR calc non Af Amer: >60 mL/min (ref >60)
Glucose, Bld: 96 mg/dL (ref 65–99)
Potassium: 3.8 mmol/L (ref 3.5–5.1)
Sodium: 140 mmol/L (ref 135–145)
Total Bilirubin: 0.7 mg/dL (ref 0.3–1.2)
Total Protein: 5.6 g/dL — ABNORMAL LOW (ref 6.5–8.1)

## 2016-06-15 LAB — CBC WITH DIFFERENTIAL/PLATELET
Basophils Absolute: 0 K/uL (ref 0.0–0.1)
Basophils Relative: 0 %
Eosinophils Absolute: 0.1 K/uL (ref 0.0–0.7)
Eosinophils Relative: 1 %
HCT: 33.3 % — ABNORMAL LOW (ref 39.0–52.0)
Hemoglobin: 11.3 g/dL — ABNORMAL LOW (ref 13.0–17.0)
Lymphocytes Relative: 17 %
Lymphs Abs: 1.5 K/uL (ref 0.7–4.0)
MCH: 34.1 pg — ABNORMAL HIGH (ref 26.0–34.0)
MCHC: 33.9 g/dL (ref 30.0–36.0)
MCV: 100.6 fL — ABNORMAL HIGH (ref 78.0–100.0)
Monocytes Absolute: 0.6 K/uL (ref 0.1–1.0)
Monocytes Relative: 7 %
Neutro Abs: 6.7 K/uL (ref 1.7–7.7)
Neutrophils Relative %: 75 %
Platelets: 179 K/uL (ref 150–400)
RBC: 3.31 MIL/uL — ABNORMAL LOW (ref 4.22–5.81)
RDW: 14.4 % (ref 11.5–15.5)
WBC: 8.8 K/uL (ref 4.0–10.5)

## 2016-06-15 LAB — BONE MARROW EXAM

## 2016-06-15 MED ORDER — FENTANYL CITRATE (PF) 100 MCG/2ML IJ SOLN
100.0000 ug | Freq: Once | INTRAMUSCULAR | Status: DC
  Filled 2016-06-15: qty 2

## 2016-06-15 MED ORDER — MIDAZOLAM HCL 5 MG/5ML IJ SOLN
INTRAMUSCULAR | Status: AC | PRN
  Administered 2016-06-15: 2 mg via INTRAVENOUS
  Administered 2016-06-15: 5 mg via INTRAVENOUS
  Administered 2016-06-15: 1 mg via INTRAVENOUS

## 2016-06-15 MED ORDER — SODIUM CHLORIDE 0.9 % IV SOLN
INTRAVENOUS | Status: DC
  Administered 2016-06-15: 08:00:00 via INTRAVENOUS

## 2016-06-15 MED ORDER — MIDAZOLAM HCL 5 MG/ML IJ SOLN
10.0000 mg | Freq: Once | INTRAMUSCULAR | Status: DC
  Filled 2016-06-15: qty 2

## 2016-06-15 MED ORDER — FENTANYL CITRATE (PF) 100 MCG/2ML IJ SOLN
INTRAMUSCULAR | Status: AC | PRN
  Administered 2016-06-15: 25 ug via INTRAVENOUS
  Administered 2016-06-15: 50 ug via INTRAVENOUS
  Administered 2016-06-15: 25 ug via INTRAVENOUS

## 2016-06-15 MED ORDER — HEPARIN SOD (PORK) LOCK FLUSH 100 UNIT/ML IV SOLN
500.0000 [IU] | INTRAVENOUS | Status: AC | PRN
  Administered 2016-06-15: 500 [IU]
  Filled 2016-06-15: qty 5

## 2016-06-15 NOTE — Discharge Instructions (Signed)
Bone Marrow Aspiration and Bone Marrow Biopsy, Care After Refer to this sheet in the next few weeks. These instructions provide you with information about caring for yourself after your procedure. Your health care provider may also give you more specific instructions. Your treatment has been planned according to current medical practices, but problems sometimes occur. Call your health care provider if you have any problems or questions after your procedure. WHAT TO EXPECT AFTER THE PROCEDURE After your procedure, it is common to have:  Soreness or tenderness around the puncture site.  Bruising. HOME CARE INSTRUCTIONS  Take medicines only as directed by your health care provider.  Follow your health care provider's instructions about:  Puncture site care.  Bandage (dressing) changes and removal.  Bathe and shower as directed by your health care provider.  Check your puncture site every day for signs of infection. Watch for:  Redness, swelling, or pain.  Fluid, blood, or pus.  Return to your normal activities as directed by your health care provider.  Keep all follow-up visits as directed by your health care provider. This is important. SEEK MEDICAL CARE IF:  You have a fever.  You have uncontrollable bleeding.  You have redness, swelling, or pain at the site of your puncture.  You have fluid, blood, or pus coming from your puncture site.   This information is not intended to replace advice given to you by your health care provider. Make sure you discuss any questions you have with your health care provider.   Document Released: 02/18/2005 Document Revised: 12/16/2014 Document Reviewed: 07/23/2014 Elsevier Interactive Patient Education 2016 Elsevier [employer]. Moderate Conscious Sedation, Adult, Care After Refer to this sheet in the next few weeks. These instructions provide you with information on caring for yourself after your procedure. Your health care provider may also give  you more specific instructions. Your treatment has been planned according to current medical practices, but problems sometimes occur. Call your health care provider if you have any problems or questions after your procedure. WHAT TO EXPECT AFTER THE PROCEDURE  After your procedure:  You may feel sleepy, clumsy, and have poor balance for several hours.  Vomiting may occur if you eat too soon after the procedure. HOME CARE INSTRUCTIONS  Do not participate in any activities where you could become injured for at least 24 hours. Do not:  Drive.  Swim.  Ride a bicycle.  Operate heavy machinery.  Cook.  Use power tools.  Climb ladders.  Work from a high place.  Do not make important decisions or sign legal documents until you are improved.  If you vomit, drink water, juice, or soup when you can drink without vomiting. Make sure you have little or no nausea before eating solid foods.  Only take over-the-counter or prescription medicines for pain, discomfort, or fever as directed by your health care provider.  Make sure you and your family fully understand everything about the medicines given to you, including what side effects may occur.  You should not drink alcohol, take sleeping pills, or take medicines that cause drowsiness for at least 24 hours.  If you smoke, do not smoke without supervision.  If you are feeling better, you may resume normal activities 24 hours after you were sedated.  Keep all appointments with your health care provider. SEEK MEDICAL CARE IF:  Your skin is pale or bluish in color.  You continue to feel nauseous or vomit.  Your pain is getting worse and is not helped by medicine.  You have bleeding or swelling.  You are still sleepy or feeling clumsy after 24 hours. SEEK IMMEDIATE MEDICAL CARE IF:  You develop a rash.  You have difficulty breathing.  You develop any type of allergic problem.  You have a fever. MAKE SURE YOU:  Understand  these instructions.  Will watch your condition.  Will get help right away if you are not doing well or get worse.   This information is not intended to replace advice given to you by your health care provider. Make sure you discuss any questions you have with your health care provider.   Document Released: 05/22/2013 Document Revised: 08/22/2014 Document Reviewed: 05/22/2013 Elsevier Interactive Patient Education [employer].

## 2016-06-15 NOTE — Procedures (Signed)
Brief examination was performed. ENT: adequate airway clearance Heart: regular rate and rhythm.No Murmurs Lungs: clear to auscultation, no wheezes, normal respiratory effort  American Society of Anesthesiologists ASA scale 1  Mallampati Score of 2  Bone Marrow Biopsy and Aspiration Procedure Note   Informed consent was obtained and potential risks including bleeding, infection and pain were reviewed with the patient. I verified that the patient has been fasting since midnight.  The patient's name, date of birth, identification, consent and allergies were verified prior to the start of procedure and time out was performed.  A total of 8 mg of IV Versed and 100 mcg of IV fentanyl were given.  The right posterior iliac crest was chosen as the site of biopsy.  The skin was prepped with Betadine solution.   8 cc of 1% lidocaine was used to provide local anaesthesia.   10 cc of bone marrow aspirate was obtained followed by 1 inch biopsy.   The procedure was tolerated well and there were no complications.  The patient was stable at the end of the procedure.  Specimens sent for flow cytometry, cytogenetics and additional studies.  

## 2016-06-16 LAB — UPEP/UIFE/LIGHT CHAINS/TP, 24-HR UR
% BETA, Urine: 46.8 %
ALBUMIN, U: 32.3 %
ALPHA 1 URINE: 3.9 %
ALPHA-2-GLOBULIN, U: 11.5 %
FREE LAMBDA LT CHAINS, UR: 55.4 mg/L — AB (ref 0.24–6.66)
Free Kappa Lt Chains,Ur: 21.9 mg/L (ref 1.35–24.19)
GAMMA GLOBULIN URINE: 5.5 %
KAPPA/LAMBDA RATIO, U: 0.4 — AB (ref 2.04–10.37)
M-SPIKE, %: 14.9 % — ABNORMAL HIGH
M-Spike, mg/24 hr: 59 mg/24 hr — ABNORMAL HIGH
PDF: 0
PROTEIN,TOTAL,URINE: 26.4 mg/dL
Prot,24hr calculated: 396 mg/24 hr — ABNORMAL HIGH (ref 30–150)

## 2016-06-17 LAB — MULTIPLE MYELOMA PANEL, SERUM
Albumin SerPl Elph-Mcnc: 3.7 g/dL (ref 2.9–4.4)
Albumin/Glob SerPl: 1.8 — ABNORMAL HIGH (ref 0.7–1.7)
Alpha 1: 0.2 g/dL (ref 0.0–0.4)
Alpha2 Glob SerPl Elph-Mcnc: 0.5 g/dL (ref 0.4–1.0)
B-Globulin SerPl Elph-Mcnc: 1 g/dL (ref 0.7–1.3)
Gamma Glob SerPl Elph-Mcnc: 0.3 g/dL — ABNORMAL LOW (ref 0.4–1.8)
Globulin, Total: 2.1 g/dL — ABNORMAL LOW (ref 2.2–3.9)
IGA: 49 mg/dL — AB (ref 90–386)
IGM, SERUM: 5 mg/dL — AB (ref 20–172)
IgG (Immunoglobin G), Serum: 427 mg/dL — ABNORMAL LOW (ref 700–1600)
TOTAL PROTEIN ELP: 5.8 g/dL — AB (ref 6.0–8.5)

## 2016-06-17 MED FILL — ACYCLOVIR 400 MG TABLET: 400 | 30 days supply | Qty: 60 | Fill #2

## 2016-06-17 MED FILL — VOLTAREN 1% GEL: 1 | 25 days supply | Qty: 100 | Fill #0

## 2016-06-22 LAB — CHROMOSOME ANALYSIS, BONE MARROW

## 2016-06-22 LAB — TISSUE HYBRIDIZATION (BONE MARROW)-NCBH

## 2016-06-23 ENCOUNTER — Ambulatory Visit (HOSPITAL_BASED_OUTPATIENT_CLINIC_OR_DEPARTMENT_OTHER): Payer: Medicaid Other | Admitting: Hematology and Oncology

## 2016-06-23 ENCOUNTER — Telehealth: Payer: Self-pay | Admitting: Hematology and Oncology

## 2016-06-23 ENCOUNTER — Encounter: Payer: Self-pay | Admitting: Hematology and Oncology

## 2016-06-23 VITALS — BP 138/82 | HR 87 | Temp 98.2°F | Resp 18 | Ht 66.0 in | Wt 173.2 lb

## 2016-06-23 DIAGNOSIS — D61810 Antineoplastic chemotherapy induced pancytopenia: Secondary | ICD-10-CM

## 2016-06-23 DIAGNOSIS — G620 Drug-induced polyneuropathy: Secondary | ICD-10-CM

## 2016-06-23 DIAGNOSIS — G893 Neoplasm related pain (acute) (chronic): Secondary | ICD-10-CM | POA: Diagnosis not present

## 2016-06-23 DIAGNOSIS — C9001 Multiple myeloma in remission: Secondary | ICD-10-CM

## 2016-06-23 DIAGNOSIS — M898X9 Other specified disorders of bone, unspecified site: Secondary | ICD-10-CM

## 2016-06-23 DIAGNOSIS — C9000 Multiple myeloma not having achieved remission: Secondary | ICD-10-CM | POA: Diagnosis present

## 2016-06-23 DIAGNOSIS — T451X5A Adverse effect of antineoplastic and immunosuppressive drugs, initial encounter: Secondary | ICD-10-CM

## 2016-06-23 DIAGNOSIS — K209 Esophagitis, unspecified without bleeding: Secondary | ICD-10-CM

## 2016-06-23 DIAGNOSIS — Z23 Encounter for immunization: Secondary | ICD-10-CM | POA: Diagnosis not present

## 2016-06-23 DIAGNOSIS — Z8042 Family history of malignant neoplasm of prostate: Secondary | ICD-10-CM

## 2016-06-23 MED ORDER — INFLUENZA VAC SPLIT QUAD 0.5 ML IM SUSY
0.5000 mL | PREFILLED_SYRINGE | Freq: Once | INTRAMUSCULAR | Status: AC
  Administered 2016-06-23: 0.5 mL via INTRAMUSCULAR
  Filled 2016-06-23: qty 0.5

## 2016-06-23 MED ORDER — ZOLPIDEM TARTRATE 10 MG PO TABS: 10.0000 mg | ORAL_TABLET | Freq: Every evening | ORAL | 3 refills | Status: DC | PRN

## 2016-06-23 MED ORDER — TRAMADOL HCL 50 MG PO TABS: 50.0000 mg | ORAL_TABLET | Freq: Four times a day (QID) | ORAL | 0 refills | Status: DC | PRN

## 2016-06-23 MED ORDER — AMLODIPINE BESYLATE 5 MG PO TABS: 5.0000 mg | ORAL_TABLET | Freq: Every day | ORAL | 6 refills | Status: DC

## 2016-06-23 MED ORDER — PANTOPRAZOLE SODIUM 40 MG PO TBEC: 40.0000 mg | DELAYED_RELEASE_TABLET | Freq: Every day | ORAL | 6 refills | Status: DC

## 2016-06-23 MED ORDER — LISINOPRIL 20 MG PO TABS: 20.0000 mg | ORAL_TABLET | Freq: Every day | ORAL | 6 refills | Status: DC

## 2016-06-23 MED ORDER — DICLOFENAC SODIUM 1 % TD GEL: 2.0000 g | Freq: Four times a day (QID) | TRANSDERMAL | 2 refills | Status: DC

## 2016-06-23 MED FILL — ZOLPIDEM TARTRATE 10 MG TAB: 10 | 30 days supply | Qty: 30 | Fill #0

## 2016-06-23 MED FILL — LISINOPRIL 20 MG TABLET: 20 | 30 days supply | Qty: 30 | Fill #0

## 2016-06-23 MED FILL — AMLODIPINE BESYLATE 5 MG TA: 5 | 30 days supply | Qty: 30 | Fill #0

## 2016-06-23 MED FILL — traMADol HCL 50 MG TABS: 50 | 12 days supply | Qty: 90 | Fill #0

## 2016-06-23 NOTE — Telephone Encounter (Signed)
Gave patient avs report and appointments for December  °

## 2016-06-23 NOTE — Assessment & Plan Note (Signed)
I reviewed recent bone marrow biopsy report with the patient and his wife. 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 I will make follow-up appointment to see him back at the end of the year after his transplant.

## 2016-06-23 NOTE — Assessment & Plan Note (Signed)
The patient has neuropathy from prior chemotherapy with Velcade. He will continue taking gabapentin 600 mg 3 times a day and reassess next month.

## 2016-06-23 NOTE — Progress Notes (Signed)
[facility] OFF[facility] PROGRESS NOTE  Patient Care Team: Boykin Nearing, MD as PCP - General (Family Medicine) Reola Calkins, MD as Consulting Physician (Hematology and Oncology)  SUMMARY OF ONCOLOGIC HISTORY:   Multiple myeloma ([place])   08/28/2015 Bone Marrow Biopsy    Accession: [id number] biopsy showed 58% myeloma involvement. Cytogenetics was normal and FISH showed Del 13q and t(11;14).       09/07/2015 - 01/22/2016 Chemotherapy    he received 6 cycles of treatment of Velcade and Revlimid with Dex       09/12/2015 - 09/16/2015 Hospital Admission    He was admitted to the hospital with sepsis, treatment was placed on hold and he received one dose of GCSF for leukopenia      01/12/2016 Adverse Reaction    Delay treatment due to neutropenia      01/27/2016 Imaging    ECHO at [facility] showed normal EF      01/27/2016 PET scan    PET scan at [facility] showed innumerable lytic lesions throughout      01/27/2016 Procedure    PFT is within normal limits at [facility]      01/28/2016 Bone Marrow Biopsy    Bone marrow at [facility] showed persistent plasma cell myeloma in a normocellularmarrow (30%) with 20% atypical plasma cells and myeloid hyperplasia.      02/11/2016 - 06/10/2016 Chemotherapy    He is started on salvage Rx with Kyprolis, Cytoxan and dexamethasone      03/24/2016 Imaging    US venous Doppler showed no evidence of deep vein or superficial thrombosis involving the right lower extremity and left lower extremity. No evidence of Baker&'s cyst on the right or left.      05/18/2016 - 05/19/2016 Hospital Admission    He was admitted for chest pain evaluation. Cardiac work-up excluded cardiac events and CT angiogram were negative for PE. It showed esphageal thickening c/w reflux esophagitis likely cause for his chest discomfort      05/23/2016 - 05/25/2016 Hospital Admission    Patient was hospitalized for worsening shortness of breath and worsening  pain. CT angiogram chest negative for pulmonary emboli. Patient had a recent normal 2-D echo and ruled out for MI. Patient noted to be dehydrated. Patient admitted placed on IV fluids. Diuretic discontinued. Patient's gabapentin has been increased per recommendations from prior office visit from patient's oncologist to 600 mg 3 times daily      06/15/2016 Bone Marrow Biopsy    Bone marrow biopsy showed only 2% plasma cell, normal FISH and cytogenetics       INTERVAL HISTORY: Please see below for problem oriented charting. He returns today to review test results. Repeat bone marrow biopsy showed minimum residual disease He continued to have chronic bone pain and neuropathic pain, stable  REVIEW OF SYSTEMS:   Constitutional: Denies fevers, chills or abnormal weight loss Eyes: Denies blurriness of vision Ears, nose, mouth, throat, and face: Denies mucositis or sore throat Respiratory: Denies cough, dyspnea or wheezes Cardiovascular: Denies palpitation, chest discomfort or lower extremity swelling Gastrointestinal:  Denies nausea, heartburn or change in bowel habits Skin: Denies abnormal skin rashes Lymphatics: Denies new lymphadenopathy or easy bruising Neurological:Denies numbness, tingling or new weaknesses Behavioral/Psych: Mood is stable, no new changes  All other systems were reviewed with the patient and are negative.  I have reviewed the past medical history, past surgical history, social history and family history with the patient and they are unchanged from  previous note.  ALLERGIES:  is allergic to benadryl [diphenhydramine]; trazodone and nefazodone; and latex.  MEDICATIONS:  Current Outpatient Prescriptions  Medication Sig Dispense Refill  . acyclovir (ZOVIRAX) 400 MG tablet Take 1 tablet (400 mg total) by mouth 2 (two) times daily. 60 tablet 3  . amLODipine (NORVASC) 5 MG tablet Take 1 tablet (5 mg total) by mouth daily. 30 tablet 6  . aspirin EC 81 MG tablet Take 81 mg  by mouth daily.    . bisacodyl (DULCOLAX) 5 MG EC tablet Take 5 mg by mouth 2 (two) times daily as needed for moderate constipation.    Marland Kitchen CALCIUM PO Take 1 tablet by mouth daily.    . cholecalciferol (VITAMIN D) 1000 units tablet Take 1,000 Units by mouth daily.     Marland Kitchen dexamethasone (DECADRON) 4 MG tablet Take 5 pills every week on Thursdays with food (Patient taking differently: Take 5 tablets (20 mg total) once  Weekly on Mondays with food.) 40 tablet 4  . diclofenac sodium (VOLTAREN) 1 % GEL Apply 2 g topically 4 (four) times daily. 100 g 2  . gabapentin (NEURONTIN) 300 MG capsule Take 2 capsules (600 mg total) by mouth 3 (three) times daily. 60 capsule 0  . lidocaine-prilocaine (EMLA) cream Apply to affected area once 30 g 3  . lisinopril (PRINIVIL,ZESTRIL) 20 MG tablet Take 1 tablet (20 mg total) by mouth daily. 30 tablet 6  . pantoprazole (PROTONIX) 40 MG tablet Take 1 tablet (40 mg total) by mouth daily. 30 tablet 6  . polyethylene glycol (MIRALAX / GLYCOLAX) packet Take 17 g by mouth 2 (two) times daily. 14 each 0  . ranitidine (ZANTAC) 150 MG tablet Take 1 tablet (150 mg total) by mouth at bedtime. 30 tablet 9  . traMADol (ULTRAM) 50 MG tablet Take 1-2 tablets (50-100 mg total) by mouth every 6 (six) hours as needed. for pain 90 tablet 0  . zolpidem (AMBIEN) 10 MG tablet Take 1 tablet (10 mg total) by mouth at bedtime as needed for sleep. 30 tablet 3   Current Facility-Administered Medications  Medication Dose Route Frequency Provider Last Rate Last Dose  . Influenza vac split quadrivalent PF (FLUARIX) injection 0.5 mL  0.5 mL Intramuscular Once Heath Lark, MD        PHYSICAL EXAMINATION: ECOG PERFORMANCE STATUS: 0 - Asymptomatic  Vitals:   06/23/16 1041  BP: 138/82  Pulse: 87  Resp: 18  Temp: 98.2 F (36.8 C)   Filed Weights   06/23/16 1041  Weight: 173 lb 3.2 oz (78.6 kg)    GENERAL:alert, no distress and comfortable SKIN: skin color, texture, turgor are normal, no  rashes or significant lesions EYES: normal, Conjunctiva are pink and non-injected, sclera clear Musculoskeletal:no cyanosis of digits and no clubbing  NEURO: alert & oriented x 3 with fluent speech, no focal motor/sensory deficits  LABORATORY DATA:  I have reviewed the data as listed    Component Value Date/Time   NA 140 06/15/2016 0720   NA 144 06/09/2016 1210   K 3.8 06/15/2016 0720   K 3.8 06/09/2016 1210   CL 109 06/15/2016 0720   CO2 23 06/15/2016 0720   CO2 26 06/09/2016 1210   GLUCOSE 96 06/15/2016 0720   GLUCOSE 120 06/09/2016 1210   BUN 20 06/15/2016 0720   BUN 17.7 06/09/2016 1210   CREATININE 0.86 06/15/2016 0720   CREATININE 1.0 06/09/2016 1210   CALCIUM 8.8 (L) 06/15/2016 0720   CALCIUM 9.2 06/09/2016 1210  PROT 5.6 (L) 06/15/2016 0720   PROT 6.4 06/09/2016 1210   ALBUMIN 3.7 06/15/2016 0720   ALBUMIN 3.4 (L) 06/09/2016 1210   AST 19 06/15/2016 0720   AST 22 06/09/2016 1210   ALT 42 06/15/2016 0720   ALT 73 (H) 06/09/2016 1210   ALKPHOS 39 06/15/2016 0720   ALKPHOS 57 06/09/2016 1210   BILITOT 0.7 06/15/2016 0720   BILITOT 0.45 06/09/2016 1210   GFRNONAA >60 06/15/2016 0720   GFRNONAA 71 07/24/2015 1155   GFRAA >60 06/15/2016 0720   GFRAA 82 07/24/2015 1155    No results found for: SPEP, UPEP  Lab Results  Component Value Date   WBC 8.8 06/15/2016   NEUTROABS 6.7 06/15/2016   HGB 11.3 (L) 06/15/2016   HCT 33.3 (L) 06/15/2016   MCV 100.6 (H) 06/15/2016   PLT 179 06/15/2016      Chemistry      Component Value Date/Time   NA 140 06/15/2016 0720   NA 144 06/09/2016 1210   K 3.8 06/15/2016 0720   K 3.8 06/09/2016 1210   CL 109 06/15/2016 0720   CO2 23 06/15/2016 0720   CO2 26 06/09/2016 1210   BUN 20 06/15/2016 0720   BUN 17.7 06/09/2016 1210   CREATININE 0.86 06/15/2016 0720   CREATININE 1.0 06/09/2016 1210      Component Value Date/Time   CALCIUM 8.8 (L) 06/15/2016 0720   CALCIUM 9.2 06/09/2016 1210   ALKPHOS 39 06/15/2016 0720    ALKPHOS 57 06/09/2016 1210   AST 19 06/15/2016 0720   AST 22 06/09/2016 1210   ALT 42 06/15/2016 0720   ALT 73 (H) 06/09/2016 1210   BILITOT 0.7 06/15/2016 0720   BILITOT 0.45 06/09/2016 1210      ASSESSMENT & PLAN:  Multiple myeloma ([place]) I reviewed recent bone marrow biopsy report with the patient and his wife. 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 I will make follow-up appointment to see him back at the end of the year after his transplant.  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 He is currently taking tramadol as needed. 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 He will continue taking gabapentin 600 mg 3 times a day and reassess next month.   No orders of the defined types were placed in this encounter.  All questions were answered. The patient knows to call the clinic with any problems, questions or concerns. No barriers to learning was detected. I spent 15 minutes counseling the patient face to face. The total time spent in the appointment was 20 minutes and more than 50% was on counseling and review of test results     Heath Lark, MD 06/23/2016 12:35 PM

## 2016-06-23 NOTE — Assessment & Plan Note (Signed)
The patient is adamant that he does not want to take narcotic therapy. He is currently taking tramadol as needed. We'll continue the same He is on calcium and vitamin D supplement He also takes gabapentin for neuropathic pain

## 2016-06-24 ENCOUNTER — Encounter (HOSPITAL_COMMUNITY): Payer: Self-pay

## 2016-06-27 ENCOUNTER — Telehealth: Payer: Self-pay | Admitting: *Deleted

## 2016-06-27 MED FILL — GABAPENTIN 300 MG CAPSULE: 300 | 15 days supply | Qty: 90 | Fill #3

## 2016-06-27 MED FILL — raNITIdine HCL 150 MG TABS: 150 | 30 days supply | Qty: 30 | Fill #1

## 2016-06-27 NOTE — Telephone Encounter (Signed)
VM from Urban Gibson, BMT coordinator at [facility], requesting copy of BMBx results and recent labs be faxed to their office.  Pt has appt to see Dr. Tiana Loft today.  Dr. Alvy Bimler says she gave the Bone Marrow report and lab results to pt to take w/ him to the appt.. So pt should have them with him today.  Called Wells Guiles back and left her a message that pt has reports w/ him.  I do not have extra copy of BMBx results to fax to them and his lab results should be viewable in [place].

## 2016-07-06 MED FILL — AZITHROMYCIN 250 MG TABLET: 250 | 5 days supply | Qty: 6 | Fill #0

## 2016-07-11 MED FILL — PANTOPRAZOLE SOD DR 40 MG T: 40 | 30 days supply | Qty: 30 | Fill #4

## 2016-07-19 MED FILL — LISINOPRIL 20 MG TABLET: 20 | 30 days supply | Qty: 30 | Fill #1

## 2016-07-19 MED FILL — AMLODIPINE BESYLATE 5 MG TA: 5 | 30 days supply | Qty: 30 | Fill #1

## 2016-07-19 MED FILL — ACYCLOVIR 400 MG TABLET: 400 | 30 days supply | Qty: 60 | Fill #3

## 2016-07-19 MED FILL — GABAPENTIN 300 MG CAPSULE: 300 | 15 days supply | Qty: 90 | Fill #4

## 2016-07-20 ENCOUNTER — Telehealth: Payer: Self-pay | Admitting: *Deleted

## 2016-07-20 NOTE — Telephone Encounter (Signed)
Faxed completed form to [place] for lodging request for post BMT at [facility]. They wanted this completed by Dr Alvy Bimler, not [facility] BMT

## 2016-08-09 ENCOUNTER — Telehealth: Payer: Self-pay | Admitting: *Deleted

## 2016-08-09 NOTE — Telephone Encounter (Signed)
Call from Starling Manns, RN at [facility] to notify us pt will be d/c'd tomorrow from Transplant.  She will fax over orders and update.  She is aware pt already scheduled at our clinic this Friday 12/29.

## 2016-08-12 ENCOUNTER — Ambulatory Visit: Payer: Medicaid Other

## 2016-08-12 ENCOUNTER — Ambulatory Visit (HOSPITAL_BASED_OUTPATIENT_CLINIC_OR_DEPARTMENT_OTHER): Payer: Medicaid Other | Admitting: Hematology and Oncology

## 2016-08-12 ENCOUNTER — Other Ambulatory Visit (HOSPITAL_BASED_OUTPATIENT_CLINIC_OR_DEPARTMENT_OTHER): Payer: Medicaid Other

## 2016-08-12 ENCOUNTER — Other Ambulatory Visit: Payer: Self-pay | Admitting: Hematology and Oncology

## 2016-08-12 ENCOUNTER — Telehealth: Payer: Self-pay | Admitting: Hematology and Oncology

## 2016-08-12 ENCOUNTER — Encounter: Payer: Self-pay | Admitting: Hematology and Oncology

## 2016-08-12 VITALS — BP 120/77 | HR 115 | Temp 98.2°F | Resp 20 | Ht 66.0 in | Wt 163.9 lb

## 2016-08-12 DIAGNOSIS — R1310 Dysphagia, unspecified: Secondary | ICD-10-CM

## 2016-08-12 DIAGNOSIS — T451X5A Adverse effect of antineoplastic and immunosuppressive drugs, initial encounter: Secondary | ICD-10-CM

## 2016-08-12 DIAGNOSIS — E441 Mild protein-calorie malnutrition: Secondary | ICD-10-CM

## 2016-08-12 DIAGNOSIS — G620 Drug-induced polyneuropathy: Secondary | ICD-10-CM | POA: Diagnosis not present

## 2016-08-12 DIAGNOSIS — C9000 Multiple myeloma not having achieved remission: Secondary | ICD-10-CM

## 2016-08-12 DIAGNOSIS — Z9481 Bone marrow transplant status: Secondary | ICD-10-CM | POA: Diagnosis not present

## 2016-08-12 DIAGNOSIS — C9001 Multiple myeloma in remission: Secondary | ICD-10-CM

## 2016-08-12 DIAGNOSIS — Z95828 Presence of other vascular implants and grafts: Secondary | ICD-10-CM

## 2016-08-12 LAB — COMPREHENSIVE METABOLIC PANEL
ALT: 25 U/L (ref 0–55)
ANION GAP: 11 meq/L (ref 3–11)
AST: 19 U/L (ref 5–34)
Albumin: 3.4 g/dL — ABNORMAL LOW (ref 3.5–5.0)
Alkaline Phosphatase: 56 U/L (ref 40–150)
BUN: 7.2 mg/dL (ref 7.0–26.0)
CHLORIDE: 109 meq/L (ref 98–109)
CO2: 22 meq/L (ref 22–29)
CREATININE: 0.8 mg/dL (ref 0.7–1.3)
Calcium: 9.2 mg/dL (ref 8.4–10.4)
EGFR: >90 mL/min/{1.73_m2} (ref >90)
Glucose: 144 mg/dl — ABNORMAL HIGH (ref 70–140)
POTASSIUM: 3.8 meq/L (ref 3.5–5.1)
Sodium: 142 mEq/L (ref 136–145)
Total Bilirubin: 0.22 mg/dL (ref 0.20–1.20)
Total Protein: 6.4 g/dL (ref 6.4–8.3)

## 2016-08-12 LAB — CBC WITH DIFFERENTIAL/PLATELET
BASO%: 0.7 % (ref 0.0–2.0)
Basophils Absolute: 0 10*3/uL (ref 0.0–0.1)
EOS%: 0 % (ref 0.0–7.0)
Eosinophils Absolute: 0 10*3/uL (ref 0.0–0.5)
HCT: 31.8 % — ABNORMAL LOW (ref 38.4–49.9)
HGB: 10.7 g/dL — ABNORMAL LOW (ref 13.0–17.1)
LYMPH%: 21.6 % (ref 14.0–49.0)
MCH: 31 pg (ref 27.2–33.4)
MCHC: 33.5 g/dL (ref 32.0–36.0)
MCV: 92.6 fL (ref 79.3–98.0)
MONO#: 1.5 10*3/uL — ABNORMAL HIGH (ref 0.1–0.9)
MONO%: 29.2 % — ABNORMAL HIGH (ref 0.0–14.0)
NEUT#: 2.4 10*3/uL (ref 1.5–6.5)
NEUT%: 48.5 % (ref 39.0–75.0)
Platelets: 102 10*3/uL — ABNORMAL LOW (ref 140–400)
RBC: 3.44 10*6/uL — AB (ref 4.20–5.82)
RDW: 15.8 % — ABNORMAL HIGH (ref 11.0–14.6)
WBC: 5.1 10*3/uL (ref 4.0–10.3)
lymph#: 1.1 10*3/uL (ref 0.9–3.3)

## 2016-08-12 LAB — TECHNOLOGIST REVIEW

## 2016-08-12 MED ORDER — SODIUM CHLORIDE 0.9 % IJ SOLN
10.0000 mL | INTRAMUSCULAR | Status: DC | PRN
  Administered 2016-08-12: 10 mL via INTRAVENOUS
  Filled 2016-08-12: qty 10

## 2016-08-12 MED ORDER — HEPARIN SOD (PORK) LOCK FLUSH 100 UNIT/ML IV SOLN
500.0000 [IU] | Freq: Once | INTRAVENOUS | Status: AC | PRN
  Administered 2016-08-12: 500 [IU] via INTRAVENOUS
  Filled 2016-08-12: qty 5

## 2016-08-12 NOTE — Assessment & Plan Note (Signed)
He has mild dysphagia since transplant, could be due to residual mucositis. He denies recent choking sensation. Recommend soft diet as tolerated

## 2016-08-12 NOTE — Assessment & Plan Note (Signed)
His bone pain and neuropathic pain has improved since prior spine. I recommend reducing the dose of Gabapentin to daily at nighttime with continuing effort to wean him off pain medicine in the future

## 2016-08-12 NOTE — Assessment & Plan Note (Signed)
He is fully engrafted. He is transfusion independent. He will continue antimicrobial prophylaxis as directed by his physicians at [place]. We will continue to provide supportive care. I will see him back in a month

## 2016-08-12 NOTE — Assessment & Plan Note (Signed)
The patient is doing well. His last bone marrow aspirate and biopsy showed complete remission and he had completed autologous stem cell transplant. Continue supportive care. I will see him on a monthly basis. He will begin maintenance treatment in the future after he has fully recovered from stem cell transplant

## 2016-08-12 NOTE — Progress Notes (Signed)
[place] OFFICE PROGRESS NOTE  Patient Care Team: Boykin Nearing, MD as PCP - General (Family Medicine) Reola Calkins, MD as Consulting Physician (Hematology and Oncology)  SUMMARY OF ONCOLOGIC HISTORY:   Multiple myeloma in remission [facility])   08/28/2015 Bone Marrow Biopsy    Accession: [id number] biopsy showed 58% myeloma involvement. Cytogenetics was normal and FISH showed Del 13q and t(11;14).       09/07/2015 - 01/22/2016 Chemotherapy    he received 6 cycles of treatment of Velcade and Revlimid with Dex       09/12/2015 - 09/16/2015 Hospital Admission    He was admitted to the hospital with sepsis, treatment was placed on hold and he received one dose of GCSF for leukopenia      01/12/2016 Adverse Reaction    Delay treatment due to neutropenia      01/27/2016 Imaging    ECHO at [facility] showed normal EF      01/27/2016 PET scan    PET scan at [facility] showed innumerable lytic lesions throughout      01/27/2016 Procedure    PFT is within normal limits at [facility]      01/28/2016 Bone Marrow Biopsy    Bone marrow at [facility] showed persistent plasma cell myeloma in a normocellularmarrow (30%) with 20% atypical plasma cells and myeloid hyperplasia.      02/11/2016 - 06/10/2016 Chemotherapy    He is started on salvage Rx with Kyprolis, Cytoxan and dexamethasone      03/24/2016 Imaging    US venous Doppler showed no evidence of deep vein or superficial thrombosis involving the right lower extremity and left lower extremity. No evidence of Baker&'s cyst on the right or left.      05/18/2016 - 05/19/2016 Hospital Admission    He was admitted for chest pain evaluation. Cardiac work-up excluded cardiac events and CT angiogram were negative for PE. It showed esphageal thickening c/w reflux esophagitis likely cause for his chest discomfort      05/23/2016 - 05/25/2016 Hospital Admission    Patient was hospitalized for worsening shortness of breath  and worsening pain. CT angiogram chest negative for pulmonary emboli. Patient had a recent normal 2-D echo and ruled out for MI. Patient noted to be dehydrated. Patient admitted placed on IV fluids. Diuretic discontinued. Patient's gabapentin has been increased per recommendations from prior office visit from patient's oncologist to 600 mg 3 times daily      06/15/2016 Bone Marrow Biopsy    Bone marrow biopsy showed only 2% plasma cell, normal FISH and cytogenetics      07/26/2016 -  Chemotherapy    He received high dose melphalan at [facility]       07/27/2016 Bone Marrow Transplant    He received autologous stem cell transplant at [facility]      08/05/2016 - 08/10/2016 Hospital Admission    He was admitted to [facility] for management of neutropenic fever and diarrhea. Cultures were negative. Stool culture came back positive for Novovirus and he was managed conservatively.       INTERVAL HISTORY: Please see below for problem oriented charting. He returns with his wife after recent transplant. He is recovering well. Surprisingly, his bone pain and neuropathic pain has resolved. He denies recent fever or chills. He had very mild dysphagia since recent transplant. The patient denies any recent signs or symptoms of bleeding such as spontaneous epistaxis, hematuria or hematochezia. Denies recent change in bowel habits since  discharge from hospital  REVIEW OF SYSTEMS:   Constitutional: Denies fevers, chills or abnormal weight loss Eyes: Denies blurriness of vision Ears, nose, mouth, throat, and face: Denies mucositis or sore throat Respiratory: Denies cough, dyspnea or wheezes Cardiovascular: Denies palpitation, chest discomfort or lower extremity swelling Gastrointestinal:  Denies nausea, heartburn or change in bowel habits Skin: Denies abnormal skin rashes Lymphatics: Denies new lymphadenopathy or easy bruising Neurological:Denies numbness, tingling or new weaknesses Behavioral/Psych:  Mood is stable, no new changes  All other systems were reviewed with the patient and are negative.  I have reviewed the past medical history, past surgical history, social history and family history with the patient and they are unchanged from previous note.  ALLERGIES:  is allergic to benadryl [diphenhydramine]; trazodone and nefazodone; and latex.  MEDICATIONS:  Current Outpatient Prescriptions  Medication Sig Dispense Refill  . acyclovir (ZOVIRAX) 400 MG tablet Take 1 tablet (400 mg total) by mouth 2 (two) times daily. 60 tablet 3  . amLODipine (NORVASC) 5 MG tablet Take 1 tablet (5 mg total) by mouth daily. 30 tablet 6  . aspirin EC 81 MG tablet Take 81 mg by mouth daily.    . bisacodyl (DULCOLAX) 5 MG EC tablet Take 5 mg by mouth 2 (two) times daily as needed for moderate constipation.    Marland Kitchen CALCIUM PO Take 1 tablet by mouth daily.    . cholecalciferol (VITAMIN D) 1000 units tablet Take 1,000 Units by mouth daily.     Marland Kitchen dexamethasone (DECADRON) 4 MG tablet Take 5 pills every week on Thursdays with food (Patient taking differently: Take 5 tablets (20 mg total) once  Weekly on Mondays with food.) 40 tablet 4  . diclofenac sodium (VOLTAREN) 1 % GEL Apply 2 g topically 4 (four) times daily. 100 g 2  . gabapentin (NEURONTIN) 300 MG capsule Take 2 capsules (600 mg total) by mouth 3 (three) times daily. 60 capsule 0  . lidocaine-prilocaine (EMLA) cream Apply to affected area once 30 g 3  . lisinopril (PRINIVIL,ZESTRIL) 20 MG tablet Take 1 tablet (20 mg total) by mouth daily. 30 tablet 6  . pantoprazole (PROTONIX) 40 MG tablet Take 1 tablet (40 mg total) by mouth daily. 30 tablet 6  . polyethylene glycol (MIRALAX / GLYCOLAX) packet Take 17 g by mouth 2 (two) times daily. 14 each 0  . ranitidine (ZANTAC) 150 MG tablet Take 1 tablet (150 mg total) by mouth at bedtime. 30 tablet 9  . traMADol (ULTRAM) 50 MG tablet Take 1-2 tablets (50-100 mg total) by mouth every 6 (six) hours as needed. for pain 90  tablet 0  . zolpidem (AMBIEN) 10 MG tablet Take 1 tablet (10 mg total) by mouth at bedtime as needed for sleep. 30 tablet 3   No current facility-administered medications for this visit.     PHYSICAL EXAMINATION: ECOG PERFORMANCE STATUS: 1 - Symptomatic but completely ambulatory  Vitals:   08/12/16 1137  BP: 120/77  Pulse: (!) 115  Resp: 20  Temp: 98.2 F (36.8 C)   Filed Weights   08/12/16 1137  Weight: 163 lb 14.4 oz (74.3 kg)    GENERAL:alert, no distress and comfortable SKIN: skin color, texture, turgor are normal, no rashes or significant lesions EYES: normal, Conjunctiva are pink and non-injected, sclera clear OROPHARYNX:no exudate, no erythema and lips, buccal mucosa, and tongue normal  NECK: supple, thyroid normal size, non-tender, without nodularity LYMPH:  no palpable lymphadenopathy in the cervical, axillary or inguinal LUNGS: clear to auscultation and  percussion with normal breathing effort HEART: regular rate & rhythm and no murmurs and no lower extremity edema ABDOMEN:abdomen soft, non-tender and normal bowel sounds Musculoskeletal:no cyanosis of digits and no clubbing  NEURO: alert & oriented x 3 with fluent speech, no focal motor/sensory deficits  LABORATORY DATA:  I have reviewed the data as listed    Component Value Date/Time   NA 142 08/12/2016 1100   K 3.8 08/12/2016 1100   CL 109 06/15/2016 0720   CO2 22 08/12/2016 1100   GLUCOSE 144 (H) 08/12/2016 1100   BUN 7.2 08/12/2016 1100   CREATININE 0.8 08/12/2016 1100   CALCIUM 9.2 08/12/2016 1100   PROT 6.4 08/12/2016 1100   ALBUMIN 3.4 (L) 08/12/2016 1100   AST 19 08/12/2016 1100   ALT 25 08/12/2016 1100   ALKPHOS 56 08/12/2016 1100   BILITOT 0.22 08/12/2016 1100   GFRNONAA >60 06/15/2016 0720   GFRNONAA 71 07/24/2015 1155   GFRAA >60 06/15/2016 0720   GFRAA 82 07/24/2015 1155    No results found for: SPEP, UPEP  Lab Results  Component Value Date   WBC 5.1 08/12/2016   NEUTROABS 2.4  08/12/2016   HGB 10.7 (L) 08/12/2016   HCT 31.8 (L) 08/12/2016   MCV 92.6 08/12/2016   PLT 102 (L) 08/12/2016      Chemistry      Component Value Date/Time   NA 142 08/12/2016 1100   K 3.8 08/12/2016 1100   CL 109 06/15/2016 0720   CO2 22 08/12/2016 1100   BUN 7.2 08/12/2016 1100   CREATININE 0.8 08/12/2016 1100      Component Value Date/Time   CALCIUM 9.2 08/12/2016 1100   ALKPHOS 56 08/12/2016 1100   AST 19 08/12/2016 1100   ALT 25 08/12/2016 1100   BILITOT 0.22 08/12/2016 1100      ASSESSMENT & PLAN:  Multiple myeloma in remission [facility]) The patient is doing well. His last bone marrow aspirate and biopsy showed complete remission and he had completed autologous stem cell transplant. Continue supportive care. I will see him on a monthly basis. He will begin maintenance treatment in the future after he has fully recovered from stem cell transplant  S/P autologous bone marrow transplantation [facility]) He is fully engrafted. He is transfusion independent. He will continue antimicrobial prophylaxis as directed by his physicians at [place]. We will continue to provide supportive care. I will see him back in a month  Mild protein-calorie malnutrition ([place]) He had 10 pound weight loss since I saw him. I recommended increase nutritional supplement as tolerated  Peripheral neuropathy due to chemotherapy [facility]) His bone pain and neuropathic pain has improved since prior spine. I recommend reducing the dose of Gabapentin to daily at nighttime with continuing effort to wean him off pain medicine in the future  Dysphagia He has mild dysphagia since transplant, could be due to residual mucositis. He denies recent choking sensation. Recommend soft diet as tolerated   No orders of the defined types were placed in this encounter.  All questions were answered. The patient knows to call the clinic with any problems, questions or concerns. No barriers to learning was  detected. I spent 25 minutes counseling the patient face to face. The total time spent in the appointment was 30 minutes and more than 50% was on counseling and review of test results     Heath Lark, MD 08/12/2016 1:01 PM

## 2016-08-12 NOTE — Assessment & Plan Note (Signed)
He had 10 pound weight loss since I saw him. I recommended increase nutritional supplement as tolerated

## 2016-08-12 NOTE — Telephone Encounter (Signed)
Appointments scheduled per 08/12/16 los. Patient was given a copy of the AVS report and appointment schedule, per 08/12/16 los. °

## 2016-08-12 NOTE — Addendum Note (Signed)
Addended by: Ricarda Frame C on: 08/12/2016 02:32 PM   Modules accepted: Orders

## 2016-08-19 MED FILL — PANTOPRAZOLE SOD DR 40 MG T: 40 | 30 days supply | Qty: 30 | Fill #5

## 2016-08-19 MED FILL — GABAPENTIN 300 MG CAPSULE: 300 | 15 days supply | Qty: 90 | Fill #5

## 2016-08-29 MED FILL — raNITIdine HCL 150 MG TABS: 150 | 30 days supply | Qty: 30 | Fill #2

## 2016-08-29 MED FILL — LISINOPRIL 20 MG TABLET: 20 | 30 days supply | Qty: 30 | Fill #2

## 2016-08-29 MED FILL — AMLODIPINE BESYLATE 5 MG TA: 5 | 30 days supply | Qty: 30 | Fill #2

## 2016-09-13 ENCOUNTER — Other Ambulatory Visit (HOSPITAL_BASED_OUTPATIENT_CLINIC_OR_DEPARTMENT_OTHER): Payer: Medicaid Other

## 2016-09-13 ENCOUNTER — Ambulatory Visit (HOSPITAL_BASED_OUTPATIENT_CLINIC_OR_DEPARTMENT_OTHER): Payer: Medicaid Other | Admitting: Hematology and Oncology

## 2016-09-13 ENCOUNTER — Encounter: Payer: Self-pay | Admitting: Hematology and Oncology

## 2016-09-13 ENCOUNTER — Telehealth: Payer: Self-pay | Admitting: Hematology and Oncology

## 2016-09-13 ENCOUNTER — Ambulatory Visit: Payer: Medicaid Other

## 2016-09-13 VITALS — BP 100/66 | HR 90 | Temp 98.3°F | Resp 18 | Ht 66.0 in | Wt 163.0 lb

## 2016-09-13 DIAGNOSIS — Z95828 Presence of other vascular implants and grafts: Secondary | ICD-10-CM

## 2016-09-13 DIAGNOSIS — C9001 Multiple myeloma in remission: Secondary | ICD-10-CM

## 2016-09-13 DIAGNOSIS — Z9481 Bone marrow transplant status: Secondary | ICD-10-CM | POA: Diagnosis not present

## 2016-09-13 DIAGNOSIS — I10 Essential (primary) hypertension: Secondary | ICD-10-CM

## 2016-09-13 DIAGNOSIS — G893 Neoplasm related pain (acute) (chronic): Secondary | ICD-10-CM | POA: Diagnosis not present

## 2016-09-13 DIAGNOSIS — C9000 Multiple myeloma not having achieved remission: Secondary | ICD-10-CM

## 2016-09-13 LAB — COMPREHENSIVE METABOLIC PANEL
ALK PHOS: 41 U/L (ref 40–150)
ALT: 24 U/L (ref 0–55)
ANION GAP: 8 meq/L (ref 3–11)
AST: 21 U/L (ref 5–34)
Albumin: 3.8 g/dL (ref 3.5–5.0)
BUN: 14.8 mg/dL (ref 7.0–26.0)
CALCIUM: 9.1 mg/dL (ref 8.4–10.4)
CHLORIDE: 111 meq/L — AB (ref 98–109)
CO2: 23 mEq/L (ref 22–29)
CREATININE: 1 mg/dL (ref 0.7–1.3)
EGFR: >90 mL/min/{1.73_m2} (ref >90)
Glucose: 84 mg/dl (ref 70–140)
Potassium: 3.5 mEq/L (ref 3.5–5.1)
Sodium: 142 mEq/L (ref 136–145)
Total Bilirubin: 0.46 mg/dL (ref 0.20–1.20)
Total Protein: 6.5 g/dL (ref 6.4–8.3)

## 2016-09-13 LAB — CBC WITH DIFFERENTIAL/PLATELET
BASO%: 0.5 % (ref 0.0–2.0)
BASOS ABS: 0 10*3/uL (ref 0.0–0.1)
EOS%: 9.3 % — ABNORMAL HIGH (ref 0.0–7.0)
Eosinophils Absolute: 0.4 10*3/uL (ref 0.0–0.5)
HEMATOCRIT: 35.1 % — AB (ref 38.4–49.9)
HGB: 12.1 g/dL — ABNORMAL LOW (ref 13.0–17.1)
LYMPH%: 44.8 % (ref 14.0–49.0)
MCH: 30.7 pg (ref 27.2–33.4)
MCHC: 34.5 g/dL (ref 32.0–36.0)
MCV: 89.1 fL (ref 79.3–98.0)
MONO#: 0.5 10*3/uL (ref 0.1–0.9)
MONO%: 11.1 % (ref 0.0–14.0)
NEUT#: 1.5 10*3/uL (ref 1.5–6.5)
NEUT%: 34.3 % — AB (ref 39.0–75.0)
Platelets: 167 10*3/uL (ref 140–400)
RBC: 3.94 10*6/uL — ABNORMAL LOW (ref 4.20–5.82)
RDW: 14.9 % — ABNORMAL HIGH (ref 11.0–14.6)
WBC: 4.4 10*3/uL (ref 4.0–10.3)
lymph#: 2 10*3/uL (ref 0.9–3.3)
nRBC: 0 % (ref 0–0)

## 2016-09-13 LAB — MAGNESIUM: Magnesium: 2.1 mg/dl (ref 1.5–2.5)

## 2016-09-13 MED ORDER — HEPARIN SOD (PORK) LOCK FLUSH 100 UNIT/ML IV SOLN
500.0000 [IU] | Freq: Once | INTRAVENOUS | Status: AC | PRN
  Administered 2016-09-13: 500 [IU] via INTRAVENOUS
  Filled 2016-09-13: qty 5

## 2016-09-13 MED ORDER — SODIUM CHLORIDE 0.9 % IJ SOLN
10.0000 mL | INTRAMUSCULAR | Status: DC | PRN
  Administered 2016-09-13: 10 mL via INTRAVENOUS
  Filled 2016-09-13: qty 10

## 2016-09-13 NOTE — Telephone Encounter (Signed)
Appointments scheduled per 09/13/16 los. Patient was given a copy of the appointment schedule and AVS report per 0/30/18 los. °

## 2016-09-13 NOTE — Progress Notes (Signed)
[place] OFFICE PROGRESS NOTE  Patient Care Team: Boykin Nearing, MD as PCP - General (Family Medicine) Reola Calkins, MD as Consulting Physician (Hematology and Oncology)  SUMMARY OF ONCOLOGIC HISTORY:   Multiple myeloma in remission [facility])   08/28/2015 Bone Marrow Biopsy    Accession: [id number] biopsy showed 58% myeloma involvement. Cytogenetics was normal and FISH showed Del 13q and t(11;14).       09/07/2015 - 01/22/2016 Chemotherapy    he received 6 cycles of treatment of Velcade and Revlimid with Dex       09/12/2015 - 09/16/2015 Hospital Admission    He was admitted to the hospital with sepsis, treatment was placed on hold and he received one dose of GCSF for leukopenia      01/12/2016 Adverse Reaction    Delay treatment due to neutropenia      01/27/2016 Imaging    ECHO at [facility] showed normal EF      01/27/2016 PET scan    PET scan at [facility] showed innumerable lytic lesions throughout      01/27/2016 Procedure    PFT is within normal limits at [facility]      01/28/2016 Bone Marrow Biopsy    Bone marrow at [facility] showed persistent plasma cell myeloma in a normocellularmarrow (30%) with 20% atypical plasma cells and myeloid hyperplasia.      02/11/2016 - 06/10/2016 Chemotherapy    He is started on salvage Rx with Kyprolis, Cytoxan and dexamethasone      03/24/2016 Imaging    US venous Doppler showed no evidence of deep vein or superficial thrombosis involving the right lower extremity and left lower extremity. No evidence of Baker&'s cyst on the right or left.      05/18/2016 - 05/19/2016 Hospital Admission    He was admitted for chest pain evaluation. Cardiac work-up excluded cardiac events and CT angiogram were negative for PE. It showed esphageal thickening c/w reflux esophagitis likely cause for his chest discomfort      05/23/2016 - 05/25/2016 Hospital Admission    Patient was hospitalized for worsening shortness of breath  and worsening pain. CT angiogram chest negative for pulmonary emboli. Patient had a recent normal 2-D echo and ruled out for MI. Patient noted to be dehydrated. Patient admitted placed on IV fluids. Diuretic discontinued. Patient's gabapentin has been increased per recommendations from prior office visit from patient's oncologist to 600 mg 3 times daily      06/15/2016 Bone Marrow Biopsy    Bone marrow biopsy showed only 2% plasma cell, normal FISH and cytogenetics      07/26/2016 -  Chemotherapy    He received high dose melphalan at [facility]       07/27/2016 Bone Marrow Transplant    He received autologous stem cell transplant at [facility]      08/05/2016 - 08/10/2016 Hospital Admission    He was admitted to [facility] for management of neutropenic fever and diarrhea. Cultures were negative. Stool culture came back positive for Novovirus and he was managed conservatively.       INTERVAL HISTORY: Please see below for problem oriented charting. He returns for follow-up. He is doing well. He has minimum pain. Denies recent infection. Peripheral neuropathy is hardly noticeable. No recent nausea or vomiting. He is inquiring about future appointments and plan of care  REVIEW OF SYSTEMS:   Constitutional: Denies fevers, chills or abnormal weight loss Eyes: Denies blurriness of vision Ears, nose, mouth, throat, and  face: Denies mucositis or sore throat Respiratory: Denies cough, dyspnea or wheezes Cardiovascular: Denies palpitation, chest discomfort or lower extremity swelling Gastrointestinal:  Denies nausea, heartburn or change in bowel habits Skin: Denies abnormal skin rashes Lymphatics: Denies new lymphadenopathy or easy bruising Neurological:Denies numbness, tingling or new weaknesses Behavioral/Psych: Mood is stable, no new changes  All other systems were reviewed with the patient and are negative.  I have reviewed the past medical history, past surgical history, social history  and family history with the patient and they are unchanged from previous note.  ALLERGIES:  is allergic to benadryl [diphenhydramine]; trazodone and nefazodone; and latex.  MEDICATIONS:  Current Outpatient Prescriptions  Medication Sig Dispense Refill  . acyclovir (ZOVIRAX) 800 MG tablet Take 800 mg by mouth 2 (two) times daily.    Marland Kitchen aluminum-magnesium hydroxide-simethicone (MAALOX) 130-865-78 MG/5ML SUSP Take 30 mLs by mouth every 4 (four) hours as needed.    Marland Kitchen amLODipine (NORVASC) 5 MG tablet Take 1 tablet (5 mg total) by mouth daily. 30 tablet 6  . CALCIUM PO Take 1 tablet by mouth daily.    . cholecalciferol (VITAMIN D) 1000 units tablet Take 1,000 Units by mouth daily.     Marland Kitchen gabapentin (NEURONTIN) 300 MG capsule Take 2 capsules (600 mg total) by mouth 3 (three) times daily. (Patient taking differently: Take 600 mg by mouth 2 (two) times daily. Taking once day.) 60 capsule 0  . lidocaine-prilocaine (EMLA) cream Apply to affected area once 30 g 3  . lisinopril (PRINIVIL,ZESTRIL) 20 MG tablet Take 1 tablet (20 mg total) by mouth daily. 30 tablet 6  . loperamide (IMODIUM) 2 MG capsule Take 2 mg by mouth 4 (four) times daily as needed.    . Multiple Vitamin (MULTIVITAMIN) tablet Take 1 tablet by mouth daily.  1  . pantoprazole (PROTONIX) 40 MG tablet Take 1 tablet (40 mg total) by mouth daily. 30 tablet 6  . ranitidine (ZANTAC) 150 MG tablet Take 1 tablet (150 mg total) by mouth at bedtime. 30 tablet 9  . sulfamethoxazole-trimethoprim (BACTRIM DS,SEPTRA DS) 800-160 MG tablet Take 1 tablet by mouth 3 (three) times a week.  1  . traMADol (ULTRAM) 50 MG tablet Take 1-2 tablets (50-100 mg total) by mouth every 6 (six) hours as needed. for pain 90 tablet 0  . zolpidem (AMBIEN) 10 MG tablet Take 1 tablet (10 mg total) by mouth at bedtime as needed for sleep. 30 tablet 3  . folic acid (FOLVITE) 1 MG tablet Take 1 mg by mouth daily.  1  . nystatin (MYCOSTATIN) 100000 UNIT/ML suspension Take 5 mLs by  mouth 4 (four) times daily as needed.    . ondansetron (ZOFRAN) 8 MG tablet Take 8 mg by mouth every 8 (eight) hours as needed.    . prochlorperazine (COMPAZINE) 10 MG tablet Take 10 mg by mouth every 6 (six) hours as needed.     No current facility-administered medications for this visit.     PHYSICAL EXAMINATION: ECOG PERFORMANCE STATUS: 1 - Symptomatic but completely ambulatory  Vitals:   09/13/16 1106  BP: 100/66  Pulse: 90  Resp: 18  Temp: 98.3 F (36.8 C)   Filed Weights   09/13/16 1106  Weight: 163 lb (73.9 kg)    GENERAL:alert, no distress and comfortable SKIN: skin color, texture, turgor are normal, no rashes or significant lesions EYES: normal, Conjunctiva are pink and non-injected, sclera clear OROPHARYNX:no exudate, no erythema and lips, buccal mucosa, and tongue normal  NECK: supple, thyroid normal  size, non-tender, without nodularity LYMPH:  no palpable lymphadenopathy in the cervical, axillary or inguinal LUNGS: clear to auscultation and percussion with normal breathing effort HEART: regular rate & rhythm and no murmurs and no lower extremity edema ABDOMEN:abdomen soft, non-tender and normal bowel sounds Musculoskeletal:no cyanosis of digits and no clubbing  NEURO: alert & oriented x 3 with fluent speech, no focal motor/sensory deficits  LABORATORY DATA:  I have reviewed the data as listed    Component Value Date/Time   NA 142 09/13/2016 1042   K 3.5 09/13/2016 1042   CL 109 06/15/2016 0720   CO2 23 09/13/2016 1042   GLUCOSE 84 09/13/2016 1042   BUN 14.8 09/13/2016 1042   CREATININE 1.0 09/13/2016 1042   CALCIUM 9.1 09/13/2016 1042   PROT 6.5 09/13/2016 1042   ALBUMIN 3.8 09/13/2016 1042   AST 21 09/13/2016 1042   ALT 24 09/13/2016 1042   ALKPHOS 41 09/13/2016 1042   BILITOT 0.46 09/13/2016 1042   GFRNONAA >60 06/15/2016 0720   GFRNONAA 71 07/24/2015 1155   GFRAA >60 06/15/2016 0720   GFRAA 82 07/24/2015 1155    No results found for: SPEP,  UPEP  Lab Results  Component Value Date   WBC 4.4 09/13/2016   NEUTROABS 1.5 09/13/2016   HGB 12.1 (L) 09/13/2016   HCT 35.1 (L) 09/13/2016   MCV 89.1 09/13/2016   PLT 167 09/13/2016      Chemistry      Component Value Date/Time   NA 142 09/13/2016 1042   K 3.5 09/13/2016 1042   CL 109 06/15/2016 0720   CO2 23 09/13/2016 1042   BUN 14.8 09/13/2016 1042   CREATININE 1.0 09/13/2016 1042      Component Value Date/Time   CALCIUM 9.1 09/13/2016 1042   ALKPHOS 41 09/13/2016 1042   AST 21 09/13/2016 1042   ALT 24 09/13/2016 1042   BILITOT 0.46 09/13/2016 1042       ASSESSMENT & PLAN:  Multiple myeloma in remission [facility]) The patient is doing well. His last bone marrow aspirate and biopsy showed complete remission and he had completed autologous stem cell transplant. Continue supportive care. I will see him on a monthly basis. He will begin maintenance treatment in the future after he has fully recovered from stem cell transplant  S/P autologous bone marrow transplantation [facility]) He is fully engrafted. He is transfusion independent. He will continue antimicrobial prophylaxis as directed by his physicians at [place]. We will continue to provide supportive care. I will see him back in a month  Essential hypertension His blood pressure is a little low. I recommend discontinuation of amlodipine.  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 We'll continue the same He is on calcium and vitamin D supplement I will try to wean him off gabapentin in the future   Orders Placed This Encounter  Procedures  . Comprehensive metabolic panel    Standing Status:   Future    Standing Expiration Date:   10/18/2017  . CBC with Differential/Platelet    Standing Status:   Future    Standing Expiration Date:   10/18/2017  . Kappa/lambda light chains     Standing Status:   Future    Standing Expiration Date:   10/18/2017  . Multiple Myeloma Panel (SPEP&IFE w/QIG)    Standing Status:   Future    Standing Expiration Date:   10/18/2017  All questions were answered. The patient knows to call the clinic with any problems, questions or concerns. No barriers to learning was detected. I spent 15 minutes counseling the patient face to face. The total time spent in the appointment was 20 minutes and more than 50% was on counseling and review of test results     Heath Lark, MD 09/13/2016 1:11 PM

## 2016-09-13 NOTE — Assessment & Plan Note (Signed)
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 We'll continue the same He is on calcium and vitamin D supplement I will try to wean him off gabapentin in the future

## 2016-09-13 NOTE — Assessment & Plan Note (Signed)
The patient is doing well. His last bone marrow aspirate and biopsy showed complete remission and he had completed autologous stem cell transplant. Continue supportive care. I will see him on a monthly basis. He will begin maintenance treatment in the future after he has fully recovered from stem cell transplant

## 2016-09-13 NOTE — Assessment & Plan Note (Signed)
He is fully engrafted. He is transfusion independent. He will continue antimicrobial prophylaxis as directed by his physicians at [place]. We will continue to provide supportive care. I will see him back in a month

## 2016-09-13 NOTE — Assessment & Plan Note (Signed)
His blood pressure is a little low. I recommend discontinuation of amlodipine.

## 2016-09-16 MED FILL — GABAPENTIN 300 MG CAPSULE: 300 | 15 days supply | Qty: 90 | Fill #6

## 2016-09-16 MED FILL — PANTOPRAZOLE SOD DR 40 MG T: 40 | 30 days supply | Qty: 30 | Fill #6

## 2016-09-16 MED FILL — SULFAMETHOXAZOLE/TMP DS TAB: 800-160 | 28 days supply | Qty: 12 | Fill #0 | Status: TO

## 2016-09-20 MED FILL — raNITIdine HCL 150 MG TABS: 150 | 30 days supply | Qty: 30 | Fill #3

## 2016-09-27 ENCOUNTER — Other Ambulatory Visit: Payer: Self-pay | Admitting: Hematology and Oncology

## 2016-09-27 MED FILL — traMADol HCL 50 MG TABS: 50 | 11 days supply | Qty: 90 | Fill #0

## 2016-09-27 MED FILL — LISINOPRIL 20 MG TABLET: 20 | 30 days supply | Qty: 30 | Fill #3

## 2016-10-11 ENCOUNTER — Ambulatory Visit: Payer: Medicaid Other

## 2016-10-11 ENCOUNTER — Other Ambulatory Visit (HOSPITAL_BASED_OUTPATIENT_CLINIC_OR_DEPARTMENT_OTHER): Payer: Medicaid Other

## 2016-10-11 ENCOUNTER — Telehealth: Payer: Self-pay | Admitting: Hematology and Oncology

## 2016-10-11 ENCOUNTER — Telehealth: Payer: Self-pay

## 2016-10-11 ENCOUNTER — Ambulatory Visit (HOSPITAL_BASED_OUTPATIENT_CLINIC_OR_DEPARTMENT_OTHER): Payer: Medicaid Other | Admitting: Hematology and Oncology

## 2016-10-11 ENCOUNTER — Encounter: Payer: Self-pay | Admitting: Hematology and Oncology

## 2016-10-11 DIAGNOSIS — G620 Drug-induced polyneuropathy: Secondary | ICD-10-CM | POA: Diagnosis not present

## 2016-10-11 DIAGNOSIS — D61810 Antineoplastic chemotherapy induced pancytopenia: Secondary | ICD-10-CM

## 2016-10-11 DIAGNOSIS — T451X5A Adverse effect of antineoplastic and immunosuppressive drugs, initial encounter: Secondary | ICD-10-CM

## 2016-10-11 DIAGNOSIS — Z9481 Bone marrow transplant status: Secondary | ICD-10-CM | POA: Diagnosis not present

## 2016-10-11 DIAGNOSIS — C9001 Multiple myeloma in remission: Secondary | ICD-10-CM

## 2016-10-11 DIAGNOSIS — Z95828 Presence of other vascular implants and grafts: Secondary | ICD-10-CM

## 2016-10-11 DIAGNOSIS — G893 Neoplasm related pain (acute) (chronic): Secondary | ICD-10-CM

## 2016-10-11 LAB — CBC WITH DIFFERENTIAL/PLATELET
BASO%: 0.8 % (ref 0.0–2.0)
Basophils Absolute: 0 10*3/uL (ref 0.0–0.1)
EOS%: 4.2 % (ref 0.0–7.0)
Eosinophils Absolute: 0.1 10*3/uL (ref 0.0–0.5)
HCT: 36.4 % — ABNORMAL LOW (ref 38.4–49.9)
HGB: 12.5 g/dL — ABNORMAL LOW (ref 13.0–17.1)
LYMPH%: 40.2 % (ref 14.0–49.0)
MCH: 30.8 pg (ref 27.2–33.4)
MCHC: 34.3 g/dL (ref 32.0–36.0)
MCV: 89.8 fL (ref 79.3–98.0)
MONO#: 0.5 10*3/uL (ref 0.1–0.9)
MONO%: 17.1 % — AB (ref 0.0–14.0)
NEUT#: 1.2 10*3/uL — ABNORMAL LOW (ref 1.5–6.5)
NEUT%: 37.7 % — AB (ref 39.0–75.0)
PLATELETS: 210 10*3/uL (ref 140–400)
RBC: 4.06 10*6/uL — AB (ref 4.20–5.82)
RDW: 15.7 % — ABNORMAL HIGH (ref 11.0–14.6)
WBC: 3.2 10*3/uL — ABNORMAL LOW (ref 4.0–10.3)
lymph#: 1.3 10*3/uL (ref 0.9–3.3)

## 2016-10-11 LAB — COMPREHENSIVE METABOLIC PANEL
ALK PHOS: 44 U/L (ref 40–150)
ALT: 25 U/L (ref 0–55)
AST: 39 U/L — AB (ref 5–34)
Albumin: 3.9 g/dL (ref 3.5–5.0)
Anion Gap: 7 mEq/L (ref 3–11)
BILIRUBIN TOTAL: 0.53 mg/dL (ref 0.20–1.20)
BUN: 13.6 mg/dL (ref 7.0–26.0)
CALCIUM: 9.3 mg/dL (ref 8.4–10.4)
CO2: 25 mEq/L (ref 22–29)
CREATININE: 1.1 mg/dL (ref 0.7–1.3)
Chloride: 111 mEq/L — ABNORMAL HIGH (ref 98–109)
EGFR: 85 mL/min/{1.73_m2} — ABNORMAL LOW (ref >90)
GLUCOSE: 99 mg/dL (ref 70–140)
Potassium: 4 mEq/L (ref 3.5–5.1)
SODIUM: 142 meq/L (ref 136–145)
TOTAL PROTEIN: 6.7 g/dL (ref 6.4–8.3)

## 2016-10-11 MED ORDER — HEPARIN SOD (PORK) LOCK FLUSH 100 UNIT/ML IV SOLN
500.0000 [IU] | Freq: Once | INTRAVENOUS | Status: AC | PRN
  Administered 2016-10-11: 500 [IU] via INTRAVENOUS
  Filled 2016-10-11: qty 5

## 2016-10-11 MED ORDER — GABAPENTIN 300 MG PO CAPS: 600.0000 mg | ORAL_CAPSULE | Freq: Two times a day (BID) | ORAL | 9 refills | Status: DC

## 2016-10-11 MED ORDER — SODIUM CHLORIDE 0.9 % IJ SOLN
10.0000 mL | INTRAMUSCULAR | Status: DC | PRN
  Administered 2016-10-11: 10 mL via INTRAVENOUS
  Filled 2016-10-11: qty 10

## 2016-10-11 MED FILL — GABAPENTIN 300 MG CAPSULE: 300 | 22 days supply | Qty: 90 | Fill #0

## 2016-10-11 NOTE — Telephone Encounter (Signed)
Gave patient AVS form and calender for April appt per 10/11/16 los.

## 2016-10-11 NOTE — Assessment & Plan Note (Addendum)
He will continue antimicrobial prophylaxis as directed by his physicians at [place]. The patient requested sedate a bone marrow biopsy.  I will order it to be done around November 01, 2016 for CT-guided sedated bone marrow biopsy We will continue to provide supportive care.

## 2016-10-11 NOTE — Telephone Encounter (Signed)
Nurse called back to say they go message about sedated bone marrow biopsy ordered by Dr. Alvy Bimler for around 3-20 and patient will continue Bactrim.

## 2016-10-11 NOTE — Assessment & Plan Note (Signed)
His neuropathy is slightly worse since gabapentin taper. Recommend increasing gabapentin dose as above

## 2016-10-11 NOTE — Assessment & Plan Note (Signed)
The patient is doing well. His last bone marrow aspirate and biopsy showed complete remission and he had completed autologous stem cell transplant. Continue supportive care. I plan to see him again in April after his visit at [facility] He will begin maintenance treatment in the future after he has fully recovered from stem cell transplant

## 2016-10-11 NOTE — Progress Notes (Signed)
[place] OFFICE PROGRESS NOTE  Patient Care Team: Boykin Nearing, MD as PCP - General (Family Medicine) Reola Calkins, MD as Consulting Physician (Hematology and Oncology)  SUMMARY OF ONCOLOGIC HISTORY:   Multiple myeloma in remission [facility])   08/28/2015 Bone Marrow Biopsy    Accession: [id number] biopsy showed 58% myeloma involvement. Cytogenetics was normal and FISH showed Del 13q and t(11;14).       09/07/2015 - 01/22/2016 Chemotherapy    he received 6 cycles of treatment of Velcade and Revlimid with Dex       09/12/2015 - 09/16/2015 Hospital Admission    He was admitted to the hospital with sepsis, treatment was placed on hold and he received one dose of GCSF for leukopenia      01/12/2016 Adverse Reaction    Delay treatment due to neutropenia      01/27/2016 Imaging    ECHO at [facility] showed normal EF      01/27/2016 PET scan    PET scan at [facility] showed innumerable lytic lesions throughout      01/27/2016 Procedure    PFT is within normal limits at [facility]      01/28/2016 Bone Marrow Biopsy    Bone marrow at [facility] showed persistent plasma cell myeloma in a normocellularmarrow (30%) with 20% atypical plasma cells and myeloid hyperplasia.      02/11/2016 - 06/10/2016 Chemotherapy    He is started on salvage Rx with Kyprolis, Cytoxan and dexamethasone      03/24/2016 Imaging    US venous Doppler showed no evidence of deep vein or superficial thrombosis involving the right lower extremity and left lower extremity. No evidence of Baker&'s cyst on the right or left.      05/18/2016 - 05/19/2016 Hospital Admission    He was admitted for chest pain evaluation. Cardiac work-up excluded cardiac events and CT angiogram were negative for PE. It showed esphageal thickening c/w reflux esophagitis likely cause for his chest discomfort      05/23/2016 - 05/25/2016 Hospital Admission    Patient was hospitalized for worsening shortness of breath  and worsening pain. CT angiogram chest negative for pulmonary emboli. Patient had a recent normal 2-D echo and ruled out for MI. Patient noted to be dehydrated. Patient admitted placed on IV fluids. Diuretic discontinued. Patient's gabapentin has been increased per recommendations from prior office visit from patient's oncologist to 600 mg 3 times daily      06/15/2016 Bone Marrow Biopsy    Bone marrow biopsy showed only 2% plasma cell, normal FISH and cytogenetics      07/26/2016 -  Chemotherapy    He received high dose melphalan at [facility]       07/27/2016 Bone Marrow Transplant    He received autologous stem cell transplant at [facility]      08/05/2016 - 08/10/2016 Hospital Admission    He was admitted to [facility] for management of neutropenic fever and diarrhea. Cultures were negative. Stool culture came back positive for Novovirus and he was managed conservatively.       INTERVAL HISTORY: Please see below for problem oriented charting. He returns with his wife for further follow-up.  Denies recent infection. His energy level is fair. Since we decided to reduce the dose of gabapentin, he felt that the neuropathy is a little worse, especially at nighttime. He continues to take tramadol as needed during daytime.  REVIEW OF SYSTEMS:   Constitutional: Denies fevers, chills or abnormal  weight loss Eyes: Denies blurriness of vision Ears, nose, mouth, throat, and face: Denies mucositis or sore throat Respiratory: Denies cough, dyspnea or wheezes Cardiovascular: Denies palpitation, chest discomfort or lower extremity swelling Gastrointestinal:  Denies nausea, heartburn or change in bowel habits Skin: Denies abnormal skin rashes Lymphatics: Denies new lymphadenopathy or easy bruising Neurological:Denies numbness, tingling or new weaknesses Behavioral/Psych: Mood is stable, no new changes  All other systems were reviewed with the patient and are negative.  I have reviewed the past  medical history, past surgical history, social history and family history with the patient and they are unchanged from previous note.  ALLERGIES:  is allergic to benadryl [diphenhydramine]; trazodone and nefazodone; and latex.  MEDICATIONS:  Current Outpatient Prescriptions  Medication Sig Dispense Refill  . acyclovir (ZOVIRAX) 800 MG tablet Take 800 mg by mouth 2 (two) times daily.    Marland Kitchen aluminum-magnesium hydroxide-simethicone (MAALOX) 076-226-33 MG/5ML SUSP Take 30 mLs by mouth every 4 (four) hours as needed.    Marland Kitchen amLODipine (NORVASC) 5 MG tablet Take 1 tablet (5 mg total) by mouth daily. 30 tablet 6  . CALCIUM PO Take 1 tablet by mouth daily.    . cholecalciferol (VITAMIN D) 1000 units tablet Take 1,000 Units by mouth daily.     . folic acid (FOLVITE) 1 MG tablet Take 1 mg by mouth daily.  1  . gabapentin (NEURONTIN) 300 MG capsule Take 2 capsules (600 mg total) by mouth 2 (two) times daily. 90 capsule 9  . lidocaine-prilocaine (EMLA) cream Apply to affected area once 30 g 3  . lisinopril (PRINIVIL,ZESTRIL) 20 MG tablet Take 1 tablet (20 mg total) by mouth daily. 30 tablet 6  . loperamide (IMODIUM) 2 MG capsule Take 2 mg by mouth 4 (four) times daily as needed.    . Multiple Vitamin (MULTIVITAMIN) tablet Take 1 tablet by mouth daily.  1  . nystatin (MYCOSTATIN) 100000 UNIT/ML suspension Take 5 mLs by mouth 4 (four) times daily as needed.    . ondansetron (ZOFRAN) 8 MG tablet Take 8 mg by mouth every 8 (eight) hours as needed.    . pantoprazole (PROTONIX) 40 MG tablet Take 1 tablet (40 mg total) by mouth daily. 30 tablet 6  . prochlorperazine (COMPAZINE) 10 MG tablet Take 10 mg by mouth every 6 (six) hours as needed.    . ranitidine (ZANTAC) 150 MG tablet Take 1 tablet (150 mg total) by mouth at bedtime. 30 tablet 9  . sulfamethoxazole-trimethoprim (BACTRIM DS,SEPTRA DS) 800-160 MG tablet Take 1 tablet by mouth 3 (three) times a week.  1  . traMADol (ULTRAM) 50 MG tablet TAKE 1 TO 2 TABLETS  BY MOUTH EVERY 6 HOURS AS NEEDED FOR PAIN 90 tablet 0  . zolpidem (AMBIEN) 10 MG tablet Take 1 tablet (10 mg total) by mouth at bedtime as needed for sleep. 30 tablet 3   No current facility-administered medications for this visit.     PHYSICAL EXAMINATION: ECOG PERFORMANCE STATUS: 1 - Symptomatic but completely ambulatory  Vitals:   10/11/16 1032  BP: (!) 139/91  Pulse: 81  Resp: 18  Temp: 98.2 F (36.8 C)   Filed Weights   10/11/16 1032  Weight: 168 lb 1.6 oz (76.2 kg)    GENERAL:alert, no distress and comfortable SKIN: skin color, texture, turgor are normal, no rashes or significant lesions EYES: normal, Conjunctiva are pink and non-injected, sclera clear OROPHARYNX:no exudate, no erythema and lips, buccal mucosa, and tongue normal  NECK: supple, thyroid normal size, non-tender,  without nodularity LYMPH:  no palpable lymphadenopathy in the cervical, axillary or inguinal LUNGS: clear to auscultation and percussion with normal breathing effort HEART: regular rate & rhythm and no murmurs and no lower extremity edema ABDOMEN:abdomen soft, non-tender and normal bowel sounds Musculoskeletal:no cyanosis of digits and no clubbing  NEURO: alert & oriented x 3 with fluent speech, no focal motor/sensory deficits  LABORATORY DATA:  I have reviewed the data as listed    Component Value Date/Time   NA 142 09/13/2016 1042   K 3.5 09/13/2016 1042   CL 109 06/15/2016 0720   CO2 23 09/13/2016 1042   GLUCOSE 84 09/13/2016 1042   BUN 14.8 09/13/2016 1042   CREATININE 1.0 09/13/2016 1042   CALCIUM 9.1 09/13/2016 1042   PROT 6.5 09/13/2016 1042   ALBUMIN 3.8 09/13/2016 1042   AST 21 09/13/2016 1042   ALT 24 09/13/2016 1042   ALKPHOS 41 09/13/2016 1042   BILITOT 0.46 09/13/2016 1042   GFRNONAA >60 06/15/2016 0720   GFRNONAA 71 07/24/2015 1155   GFRAA >60 06/15/2016 0720   GFRAA 82 07/24/2015 1155    No results found for: SPEP, UPEP  Lab Results  Component Value Date   WBC  3.2 (L) 10/11/2016   NEUTROABS 1.2 (L) 10/11/2016   HGB 12.5 (L) 10/11/2016   HCT 36.4 (L) 10/11/2016   MCV 89.8 10/11/2016   PLT 210 10/11/2016      Chemistry      Component Value Date/Time   NA 142 09/13/2016 1042   K 3.5 09/13/2016 1042   CL 109 06/15/2016 0720   CO2 23 09/13/2016 1042   BUN 14.8 09/13/2016 1042   CREATININE 1.0 09/13/2016 1042      Component Value Date/Time   CALCIUM 9.1 09/13/2016 1042   ALKPHOS 41 09/13/2016 1042   AST 21 09/13/2016 1042   ALT 24 09/13/2016 1042   BILITOT 0.46 09/13/2016 1042      ASSESSMENT & PLAN:  Multiple myeloma in remission [facility]) The patient is doing well. His last bone marrow aspirate and biopsy showed complete remission and he had completed autologous stem cell transplant. Continue supportive care. I plan to see him again in April after his visit at [facility] He will begin maintenance treatment in the future after he has fully recovered from stem cell transplant  S/P autologous bone marrow transplantation [facility]) He will continue antimicrobial prophylaxis as directed by his physicians at [facility]. The patient requested sedate a bone marrow biopsy.  I will order it to be done around November 01, 2016 for CT-guided sedated bone marrow biopsy We will continue to provide supportive care.  Cancer associated pain His prior peripheral neuropathy has relapsed since gabapentin taper I recommended increasing gabapentin to 600 mg twice a day He is currently taking tramadol as needed. We'll continue the same He is on calcium and vitamin D supplement  Peripheral neuropathy due to chemotherapy ([place]) His neuropathy is slightly worse since gabapentin taper. Recommend increasing gabapentin dose as above  Pancytopenia due to antineoplastic chemotherapy (HCC) Anemia is improving.  He has new onset leukopenia, could be related to bone marrow suppression from Bactrim. We will discuss this with his transplant  physician to see if it is okay to continue Bactrim Clinically, he has no signs of infection and the patient is practicing strict hand hygiene and neutropenic precaution   Orders Placed This Encounter  Procedures  . CT BIOPSY    Standing Status:   Future  Standing Expiration Date:   11/15/2017    Order Specific Question:   Reason for Exam (SYMPTOM  OR DIAGNOSIS REQUIRED)    Answer:   bone marrow biopsy    Order Specific Question:   Preferred imaging location?    Answer:   [facility]  . CT BONE MARROW BIOPSY    Standing Status:   Future    Standing Expiration Date:   01/08/2018    Order Specific Question:   Reason for Exam (SYMPTOM  OR DIAGNOSIS REQUIRED)    Answer:   bone marrow biopsy    Order Specific Question:   Preferred imaging location?    Answer:   [facility]   All questions were answered. The patient knows to call the clinic with any problems, questions or concerns. No barriers to learning was detected. I spent 20 minutes counseling the patient face to face. The total time spent in the appointment was 30 minutes and more than 50% was on counseling and review of test results     Heath Lark, MD 10/11/2016 10:42 AM

## 2016-10-11 NOTE — Telephone Encounter (Signed)
Called and left message, patient is scheduled to have sedated bone marrow biopsy around 3-20 ordered by Dr. Alvy Bimler and patient had leukopenia today and patient needs instruction from transplant team regarding Bactrim RX.

## 2016-10-11 NOTE — Assessment & Plan Note (Signed)
Anemia is improving.  He has new onset leukopenia, could be related to bone marrow suppression from Bactrim. We will discuss this with his transplant physician to see if it is okay to continue Bactrim Clinically, he has no signs of infection and the patient is practicing strict hand hygiene and neutropenic precaution

## 2016-10-11 NOTE — Assessment & Plan Note (Addendum)
His prior peripheral neuropathy has relapsed since gabapentin taper I recommended increasing gabapentin to 600 mg twice a day He is currently taking tramadol as needed. We'll continue the same He is on calcium and vitamin D supplement

## 2016-10-12 LAB — KAPPA/LAMBDA LIGHT CHAINS
IG KAPPA FREE LIGHT CHAIN: 6.7 mg/L (ref 3.3–19.4)
IG LAMBDA FREE LIGHT CHAIN: 109.5 mg/L — AB (ref 5.7–26.3)
KAPPA/LAMBDA FLC RATIO: 0.06 — AB (ref 0.26–1.65)

## 2016-10-14 LAB — MULTIPLE MYELOMA PANEL, SERUM
ALBUMIN SERPL ELPH-MCNC: 3.8 g/dL (ref 2.9–4.4)
ALPHA 1: 0.2 g/dL (ref 0.0–0.4)
Albumin/Glob SerPl: 1.6 (ref 0.7–1.7)
Alpha2 Glob SerPl Elph-Mcnc: 0.6 g/dL (ref 0.4–1.0)
B-Globulin SerPl Elph-Mcnc: 1 g/dL (ref 0.7–1.3)
GAMMA GLOB SERPL ELPH-MCNC: 0.7 g/dL (ref 0.4–1.8)
GLOBULIN, TOTAL: 2.5 g/dL (ref 2.2–3.9)
IGG (IMMUNOGLOBIN G), SERUM: 751 mg/dL (ref 700–1600)
IgA, Qn, Serum: 57 mg/dL — ABNORMAL LOW (ref 90–386)
IgM, Qn, Serum: 26 mg/dL (ref 20–172)
Total Protein: 6.3 g/dL (ref 6.0–8.5)

## 2016-10-17 MED FILL — SULFAMETHOXAZOLE/TMP DS TAB: 800-160 | 28 days supply | Qty: 12 | Fill #1 | Status: TO

## 2016-10-17 MED FILL — raNITIdine HCL 150 MG TABS: 150 | 30 days supply | Qty: 30 | Fill #4

## 2016-11-01 ENCOUNTER — Other Ambulatory Visit: Payer: Self-pay | Admitting: Radiology

## 2016-11-01 ENCOUNTER — Other Ambulatory Visit: Payer: Self-pay | Admitting: Student

## 2016-11-02 ENCOUNTER — Ambulatory Visit (HOSPITAL_COMMUNITY)
Admission: RE | Admit: 2016-11-02 | Discharge: 2016-11-02 | Disposition: A | Discharge status: Home / Self Care | Payer: Medicaid Other | Source: Ambulatory Visit | Attending: Hematology and Oncology | Admitting: Hematology and Oncology

## 2016-11-02 ENCOUNTER — Encounter (HOSPITAL_COMMUNITY): Payer: Self-pay

## 2016-11-02 ENCOUNTER — Other Ambulatory Visit: Payer: Self-pay | Admitting: Hematology and Oncology

## 2016-11-02 DIAGNOSIS — C9001 Multiple myeloma in remission: Secondary | ICD-10-CM | POA: Diagnosis not present

## 2016-11-02 LAB — CBC WITH DIFFERENTIAL/PLATELET
BASOS ABS: 0 10*3/uL (ref 0.0–0.1)
Basophils Relative: 0 %
EOS ABS: 0.2 10*3/uL (ref 0.0–0.7)
Eosinophils Relative: 4 %
HCT: 35.5 % — ABNORMAL LOW (ref 39.0–52.0)
HEMOGLOBIN: 12.3 g/dL — AB (ref 13.0–17.0)
LYMPHS ABS: 1.7 10*3/uL (ref 0.7–4.0)
LYMPHS PCT: 37 %
MCH: 30.4 pg (ref 26.0–34.0)
MCHC: 34.6 g/dL (ref 30.0–36.0)
MCV: 87.7 fL (ref 78.0–100.0)
Monocytes Absolute: 0.6 10*3/uL (ref 0.1–1.0)
Monocytes Relative: 13 %
NEUTROS PCT: 46 %
Neutro Abs: 2.2 10*3/uL (ref 1.7–7.7)
Platelets: 187 10*3/uL (ref 150–400)
RBC: 4.05 MIL/uL — AB (ref 4.22–5.81)
RDW: 14.5 % (ref 11.5–15.5)
WBC: 4.7 10*3/uL (ref 4.0–10.5)

## 2016-11-02 LAB — PROTIME-INR
INR: 0.98
PROTHROMBIN TIME: 13 s (ref 11.4–15.2)

## 2016-11-02 MED ORDER — FENTANYL CITRATE (PF) 100 MCG/2ML IJ SOLN
INTRAMUSCULAR | Status: AC
  Filled 2016-11-02: qty 4

## 2016-11-02 MED ORDER — MIDAZOLAM HCL 2 MG/2ML IJ SOLN
INTRAMUSCULAR | Status: AC
  Filled 2016-11-02: qty 6

## 2016-11-02 MED ORDER — SODIUM CHLORIDE 0.9 % IV SOLN
INTRAVENOUS | Status: DC
  Administered 2016-11-02: 07:00:00 via INTRAVENOUS

## 2016-11-02 MED ORDER — HEPARIN SOD (PORK) LOCK FLUSH 100 UNIT/ML IV SOLN
500.0000 [IU] | INTRAVENOUS | Status: AC | PRN
  Administered 2016-11-02: 500 [IU]
  Filled 2016-11-02: qty 5

## 2016-11-02 MED ORDER — MIDAZOLAM HCL 2 MG/2ML IJ SOLN
INTRAMUSCULAR | Status: AC | PRN
  Administered 2016-11-02 (×3): 1 mg via INTRAVENOUS
  Administered 2016-11-02: 2 mg via INTRAVENOUS

## 2016-11-02 MED ORDER — FENTANYL CITRATE (PF) 100 MCG/2ML IJ SOLN
INTRAMUSCULAR | Status: AC | PRN
  Administered 2016-11-02: 50 ug via INTRAVENOUS
  Administered 2016-11-02: 25 ug via INTRAVENOUS

## 2016-11-02 MED FILL — LISINOPRIL 20 MG TABLET: 20 | 30 days supply | Qty: 30 | Fill #4

## 2016-11-02 NOTE — H&P (Signed)
Referring Physician(s): Heath Lark  Supervising Physician: Jacqulynn Cadet  Patient Status:  [facility] OP  Chief Complaint:  "I'm having a bone marrow biopsy"  Subjective: Patient familiar to IR service from prior Port-A-Cath placement in 2017.He has a history of multiple myeloma, initially diagnosed in 2017, status post chemotherapy and stem cell transplant. He is currently in remission. He presents again today for follow-up CT-guided bone marrow biopsy for further evaluation. He denies fever, headache, dyspnea, cough, abdominal/back pain, nausea, vomiting or abnormal bleeding. He does have some occasional chest discomfort and occ bilat calf pain. Past Medical History:  Diagnosis Date  . Encounter for antineoplastic chemotherapy 02/09/2016  . History of syphilis 02/24/2016  . Hypertension Dx 2016  . Multiple myeloma not having achieved remission ([place]) 09/01/2015  . Priapism 2014    Past Surgical History:  Procedure Laterality Date  . COLONOSCOPY    . POLYPECTOMY        Allergies: Benadryl [diphenhydramine]; Trazodone and nefazodone; and Latex  Medications: Prior to Admission medications   Medication Sig Start Date End Date Taking? Authorizing Provider  acyclovir (ZOVIRAX) 800 MG tablet Take 800 mg by mouth 2 (two) times daily. 08/01/16  Yes Historical Provider, MD  CALCIUM PO Take 1 tablet by mouth daily.   Yes Historical Provider, MD  cholecalciferol (VITAMIN D) 1000 units tablet Take 1,000 Units by mouth daily.    Yes Historical Provider, MD  gabapentin (NEURONTIN) 300 MG capsule Take 2 capsules (600 mg total) by mouth 2 (two) times daily. 10/11/16  Yes Heath Lark, MD  lidocaine-prilocaine (EMLA) cream Apply to affected area once 05/12/16  Yes Heath Lark, MD  lisinopril (PRINIVIL,ZESTRIL) 20 MG tablet Take 1 tablet (20 mg total) by mouth daily. 06/23/16  Yes Heath Lark, MD  Multiple Vitamin (MULTIVITAMIN) tablet Take 1 tablet by mouth daily. 08/02/16  Yes Historical Provider,  MD  ondansetron (ZOFRAN) 8 MG tablet Take 8 mg by mouth every 8 (eight) hours as needed. 08/10/16  Yes Historical Provider, MD  pantoprazole (PROTONIX) 40 MG tablet Take 1 tablet (40 mg total) by mouth daily. 06/23/16  Yes Heath Lark, MD  ranitidine (ZANTAC) 150 MG tablet Take 1 tablet (150 mg total) by mouth at bedtime. 05/26/16 08/12/18 Yes Heath Lark, MD  sulfamethoxazole-trimethoprim (BACTRIM DS,SEPTRA DS) 800-160 MG tablet Take 1 tablet by mouth 3 (three) times a week. 09/05/16  Yes Historical Provider, MD  aluminum-magnesium hydroxide-simethicone (MAALOX) 696-295-28 MG/5ML SUSP Take 30 mLs by mouth every 4 (four) hours as needed.    Historical Provider, MD  amLODipine (NORVASC) 5 MG tablet Take 1 tablet (5 mg total) by mouth daily. 06/23/16   Heath Lark, MD  folic acid (FOLVITE) 1 MG tablet Take 1 mg by mouth daily. 08/02/16   Historical Provider, MD  loperamide (IMODIUM) 2 MG capsule Take 2 mg by mouth 4 (four) times daily as needed. 08/10/16   Historical Provider, MD  nystatin (MYCOSTATIN) 100000 UNIT/ML suspension Take 5 mLs by mouth 4 (four) times daily as needed. 08/04/16   Historical Provider, MD  prochlorperazine (COMPAZINE) 10 MG tablet Take 10 mg by mouth every 6 (six) hours as needed. 07/25/16   Historical Provider, MD  traMADol (ULTRAM) 50 MG tablet TAKE 1 TO 2 TABLETS BY MOUTH EVERY 6 HOURS AS NEEDED FOR PAIN 09/27/16   Heath Lark, MD  zolpidem (AMBIEN) 10 MG tablet Take 1 tablet (10 mg total) by mouth at bedtime as needed for sleep. 06/23/16 08/12/17  Heath Lark, MD  Vital Signs: BP (!) 141/93 (BP Location: Left Arm)   Pulse 96   Temp 98.5 F (36.9 C) (Oral)   Resp 16   Ht 5' 6"  (1.676 m)   Wt 168 lb (76.2 kg)   SpO2 100%   BMI 27.12 kg/m   Physical Exam Awake, alert. Chest clear to auscultation bilaterally. Clean, intact right chest wall Port-A-Cath. Heart with regular rate and rhythm. Abdomen soft, positive bowel sounds, nontender. Lower extremities with no significant  edema  Imaging: No results found.  Labs:  CBC:  Recent Labs  08/12/16 1100 09/13/16 1042 10/11/16 0952 11/02/16 0705  WBC 5.1 4.4 3.2* 4.7  HGB 10.7* 12.1* 12.5* 12.3*  HCT 31.8* 35.1* 36.4* 35.5*  PLT 102* 167 210 187    COAGS:  Recent Labs  02/09/16 1032 11/02/16 0705  INR 1.05 0.98    BMP:  Recent Labs  05/19/16 0222 05/23/16 1724 05/24/16 0340  06/15/16 0720 08/12/16 1100 09/13/16 1042 10/11/16 0951  NA 143 137 139  < > 140 142 142 142  K 4.0 3.8 3.7  < > 3.8 3.8 3.5 4.0  CL 105 104 108  --  109  --   --   --   CO2 31 22 21*  < > 23 22 23 25   GLUCOSE 162* 122* 110*  < > 96 144* 84 99  BUN 10 26* 23*  < > 20 7.2 14.8 13.6  CALCIUM 8.8* 9.4 8.2*  < > 8.8* 9.2 9.1 9.3  CREATININE 1.13 1.19 1.10  < > 0.86 0.8 1.0 1.1  GFRNONAA >60 >60 >60  --  >60  --   --   --   GFRAA >60 >60 >60  --  >60  --   --   --   < > = values in this interval not displayed.  LIVER FUNCTION TESTS:  Recent Labs  06/15/16 0720 08/12/16 1100 09/13/16 1042 10/11/16 0951 10/11/16 0952  BILITOT 0.7 0.22 0.46 0.53  --   AST 19 19 21  39*  --   ALT 42 25 24 25   --   ALKPHOS 39 56 41 44  --   PROT 5.6* 6.4 6.5 6.7 6.3  ALBUMIN 3.7 3.4* 3.8 3.9  --     Assessment and Plan: Pt with history of multiple myeloma, initially diagnosed in 2017, status post chemotherapy and stem cell transplant. He is currently in remission. He presents  today for follow-up CT-guided bone marrow biopsy for further evaluation.Risks and benefits discussed with the patient/spouse including, but not limited to bleeding, infection, damage to adjacent structures or low yield requiring additional tests. All of the patient's questions were answered, patient is agreeable to proceed. Consent signed and in chart.     Electronically Signed: D. Rowe Robert 11/02/2016, 8:19 AM   I spent a total of 20 minutes at the the patient's bedside AND on the patient's hospital floor or unit, greater than 50% of which  was counseling/coordinating care for CT-guided bone marrow biopsy

## 2016-11-02 NOTE — Discharge Instructions (Signed)
Bone Marrow Aspiration and Bone Marrow Biopsy, Adult Bone marrow aspiration and bone marrow biopsy are procedures that are done to diagnose blood disorders. You may also have one of these procedures to help diagnose infections or some types of cancer. Bone marrow is the soft tissue that is inside your bones. Blood cells are produced in bone marrow. For bone marrow aspiration, a sample of tissue in liquid form is removed from inside your bone. For a bone marrow biopsy, a small core of bone marrow tissue is removed. These samples are examined under a microscope or tested in a lab. You may need these procedures if you have an abnormal complete blood count (CBC). The aspiration or biopsy sample is usually taken from the top of your hip bone. Sometimes, an aspiration sample is taken from your chest bone (sternum). Tell a health care provider about:  Any allergies you have.  All medicines you are taking, including vitamins, herbs, eye drops, creams, and over-the-counter medicines.  Any problems you or family members have had with anesthetic medicines.  Any blood or bone disorders you have.  Any surgeries you have had.  Any medical conditions you have.  Whether you are pregnant or you think that you may be pregnant. What are the risks? Generally, this is a safe procedure. However, problems may occur, including:  Infection.  Bleeding.  Persistent pain after the procedure.  Cracking (fracture) of the bone.  Allergic reactions to medicines. What happens before the procedure? Staying hydrated  Follow instructions from your health care provider about hydration, which may include:  Up to 2 hours before the procedure - you may continue to drink clear liquids, such as water, clear fruit juice, black coffee, and plain tea. Eating and drinking restrictions  Follow instructions from your health care provider about eating and drinking, which may include:  8 hours before the procedure - stop  eating heavy meals or foods such as meat, fried foods, or fatty foods.  6 hours before the procedure - stop eating light meals or foods, such as toast or cereal.  6 hours before the procedure - stop drinking milk or drinks that contain milk.  2 hours before the procedure - stop drinking clear liquids. Medicines   Ask your health care provider about:  Changing or stopping your regular medicines. This is especially important if you are taking diabetes medicines or blood thinners.  Taking medicines such as aspirin and ibuprofen. These medicines can thin your blood. Do not take these medicines before your procedure if your health care provider instructs you not to.  You may be given antibiotic medicine to help prevent infection. General instructions   Plan to have someone take you home after the procedure.  If you will be going home right after the procedure, plan to have someone with you for 24 hours.  Ask your health care provider how your surgical site will be marked or identified. What happens during the procedure?  To reduce your risk of infection:  Your health care team will wash or sanitize their hands.  Your skin will be washed with soap.  Hair may be removed from the surgical area.  An IV tube may be inserted into one of your veins.  The injection site will be cleaned with a germ-killing solution (antiseptic).  You will be given one or more of the following:  A medicine to help you relax (sedative).  A medicine to numb the area (local anesthetic).  A medicine to make you fall  asleep (general anesthetic). °· The bone marrow sample will be removed as follows: °¨ For an aspiration, a hollow needle will be inserted through your skin and into your bone. Bone marrow fluid will be drawn up into a syringe. °¨ For a biopsy, your health care provider will use a hollow needle to remove a core of tissue from your bone marrow. °· The needle will be removed. °· A bandage (dressing)  will be placed over the insertion site and taped in place. °The procedure may vary among health care providers and hospitals. °What happens after the procedure? °· Your blood pressure, heart rate, breathing rate, and blood oxygen level will be monitored until the medicines you were given have worn off. °· Your IV tube will be removed, and the insertion site will be checked for bleeding. °· Do not drive for 24 hours if you were given a sedative. °This information is not intended to replace advice given to you by your health care provider. Make sure you discuss any questions you have with your health care provider. °Document Released: 08/04/2004 Document Revised: 02/19/2016 Document Reviewed: 01/13/2016 °Elsevier Interactive Patient Education © 2017 [employer] °Bone Marrow Aspiration and Bone Marrow Biopsy, Adult, Care After °This sheet gives you information about how to care for yourself after your procedure. Your health care provider may also give you more specific instructions. If you have problems or questions, contact your health care provider. °What can I expect after the procedure? °After the procedure, it is common to have: °· Mild pain and tenderness. °· Swelling. °· Bruising. °Follow these instructions at home: °· Take over-the-counter or prescription medicines only as told by your health care provider. °· Do not take baths, swim, or use a hot tub until your health care provider approves. Ask if you can take a shower or have a sponge bath. °· Follow instructions from your health care provider about how to take care of the puncture site. Make sure you: °¨ Wash your hands with soap and water before you change your bandage (dressing). If soap and water are not available, use hand sanitizer. °¨ Change your dressing as told by your health care provider. °· Check your puncture site every day for signs of infection. Check for: °¨ More redness, swelling, or pain. °¨ More fluid or blood. °¨ Warmth. °¨ Pus or a bad  smell. °· Return to your normal activities as told by your health care provider. Ask your health care provider what activities are safe for you. °· Do not drive for 24 hours if you were given a medicine to help you relax (sedative). °· Keep all follow-up visits as told by your health care provider. This is important. °Contact a health care provider if: °· You have more redness, swelling, or pain around the puncture site. °· You have more fluid or blood coming from the puncture site. °· Your puncture site feels warm to the touch. °· You have pus or a bad smell coming from the puncture site. °· You have a fever. °· Your pain is not controlled with medicine. °This information is not intended to replace advice given to you by your health care provider. Make sure you discuss any questions you have with your health care provider. °Document Released: 02/18/2005 Document Revised: 02/19/2016 Document Reviewed: 01/13/2016 °Elsevier Interactive Patient Education © 2017 [employer] °Moderate Conscious Sedation, Adult, Care After °These instructions provide you with information about caring for yourself after your procedure. Your health care provider may also give you more   specific instructions. Your treatment has been planned according to current medical practices, but problems sometimes occur. Call your health care provider if you have any problems or questions after your procedure. °What can I expect after the procedure? °After your procedure, it is common: °· To feel sleepy for several hours. °· To feel clumsy and have poor balance for several hours. °· To have poor judgment for several hours. °· To vomit if you eat too soon. °Follow these instructions at home: °For at least 24 hours after the procedure:  ° °· Do not: °¨ Participate in activities where you could fall or become injured. °¨ Drive. °¨ Use heavy machinery. °¨ Drink alcohol. °¨ Take sleeping pills or medicines that cause drowsiness. °¨ Make important decisions  or sign legal documents. °¨ Take care of children on your own. °· Rest. °Eating and drinking  °· Follow the diet recommended by your health care provider. °· If you vomit: °¨ Drink water, juice, or soup when you can drink without vomiting. °¨ Make sure you have little or no nausea before eating solid foods. °General instructions  °· Have a responsible adult stay with you until you are awake and alert. °· Take over-the-counter and prescription medicines only as told by your health care provider. °· If you smoke, do not smoke without supervision. °· Keep all follow-up visits as told by your health care provider. This is important. °Contact a health care provider if: °· You keep feeling nauseous or you keep vomiting. °· You feel light-headed. °· You develop a rash. °· You have a fever. °Get help right away if: °· You have trouble breathing. °This information is not intended to replace advice given to you by your health care provider. Make sure you discuss any questions you have with your health care provider. °Document Released: 05/22/2013 Document Revised: 01/04/2016 Document Reviewed: 11/21/2015 °Elsevier Interactive Patient Education © 2017 Elsevier Inc. ° °

## 2016-11-02 NOTE — Discharge Instructions (Signed)
Bone Marrow Aspiration and Bone Marrow Biopsy, Adult, Care After This sheet gives you information about how to care for yourself after your procedure. Your health care provider may also give you more specific instructions. If you have problems or questions, contact your health care provider. What can I expect after the procedure? After the procedure, it is common to have:  Mild pain and tenderness.  Swelling.  Bruising. Follow these instructions at home:  Take over-the-counter or prescription medicines only as told by your health care provider.  Do not take baths, swim, or use a hot tub until your health care provider approves. Ask if you can take a shower or have a sponge bath.  Follow instructions from your health care provider about how to take care of the puncture site. Make sure you:  Wash your hands with soap and water before you change your bandage (dressing). If soap and water are not available, use hand sanitizer.  Change your dressing as told by your health care provider.  Check your puncture siteevery day for signs of infection. Check for:  More redness, swelling, or pain.  More fluid or blood.  Warmth.  Pus or a bad smell.  Return to your normal activities as told by your health care provider. Ask your health care provider what activities are safe for you.  Do not drive for 24 hours if you were given a medicine to help you relax (sedative).  Keep all follow-up visits as told by your health care provider. This is important. Contact a health care provider if:  You have more redness, swelling, or pain around the puncture site.  You have more fluid or blood coming from the puncture site.  Your puncture site feels warm to the touch.  You have pus or a bad smell coming from the puncture site.  You have a fever.  Your pain is not controlled with medicine. This information is not intended to replace advice given to you by your health care provider. Make sure you  discuss any questions you have with your health care provider. Document Released: 02/18/2005 Document Revised: 02/19/2016 Document Reviewed: 01/13/2016 Elsevier Interactive Patient Education  2017 [employer].  Moderate Conscious Sedation, Adult, Care After These instructions provide you with information about caring for yourself after your procedure. Your health care provider may also give you more specific instructions. Your treatment has been planned according to current medical practices, but problems sometimes occur. Call your health care provider if you have any problems or questions after your procedure. What can I expect after the procedure? After your procedure, it is common:  To feel sleepy for several hours.  To feel clumsy and have poor balance for several hours.  To have poor judgment for several hours.  To vomit if you eat too soon. Follow these instructions at home: For at least 24 hours after the procedure:    Do not:  Participate in activities where you could fall or become injured.  Drive.  Use heavy machinery.  Drink alcohol.  Take sleeping pills or medicines that cause drowsiness.  Make important decisions or sign legal documents.  Take care of children on your own.  Rest. Eating and drinking   Follow the diet recommended by your health care provider.  If you vomit:  Drink water, juice, or soup when you can drink without vomiting.  Make sure you have little or no nausea before eating solid foods. General instructions   Have a responsible adult stay with you until  you are awake and alert.  Take over-the-counter and prescription medicines only as told by your health care provider.  If you smoke, do not smoke without supervision.  Keep all follow-up visits as told by your health care provider. This is important. Contact a health care provider if:  You keep feeling nauseous or you keep vomiting.  You feel light-headed.  You develop a  rash.  You have a fever. Get help right away if:  You have trouble breathing. This information is not intended to replace advice given to you by your health care provider. Make sure you discuss any questions you have with your health care provider. Document Released: 05/22/2013 Document Revised: 01/04/2016 Document Reviewed: 11/21/2015 Elsevier Interactive Patient Education  2017 [employer].

## 2016-11-02 NOTE — Procedures (Signed)
Interventional Radiology Procedure Note  Procedure: CT guided aspirate and core biopsy of right iliac bone Complications: None Recommendations: - Bedrest supine x 1 hrs - Hydrocodone PRN  Pain - Follow biopsy results  Signed,  Nimesh Riolo K. Matylda Fehring, MD   

## 2016-11-09 MED FILL — SULFAMETHOXAZOLE/TMP DS TAB: 800-160 | 28 days supply | Qty: 12 | Fill #2 | Status: TO

## 2016-11-09 MED FILL — GABAPENTIN 300 MG CAPSULE: 300 | 22 days supply | Qty: 90 | Fill #1 | Status: TO

## 2016-11-11 LAB — TISSUE HYBRIDIZATION (BONE MARROW)-NCBH

## 2016-11-11 LAB — CHROMOSOME ANALYSIS, BONE MARROW

## 2016-11-16 ENCOUNTER — Encounter (HOSPITAL_COMMUNITY): Payer: Self-pay

## 2016-11-18 ENCOUNTER — Telehealth: Payer: Self-pay | Admitting: Hematology and Oncology

## 2016-11-18 ENCOUNTER — Encounter: Payer: Self-pay | Admitting: Hematology and Oncology

## 2016-11-18 ENCOUNTER — Ambulatory Visit (HOSPITAL_BASED_OUTPATIENT_CLINIC_OR_DEPARTMENT_OTHER): Payer: Medicaid Other | Admitting: Hematology and Oncology

## 2016-11-18 VITALS — BP 116/75 | HR 99 | Temp 97.3°F | Resp 18 | Ht 66.0 in | Wt 168.3 lb

## 2016-11-18 DIAGNOSIS — Z7189 Other specified counseling: Secondary | ICD-10-CM | POA: Diagnosis not present

## 2016-11-18 DIAGNOSIS — M898X9 Other specified disorders of bone, unspecified site: Secondary | ICD-10-CM | POA: Diagnosis not present

## 2016-11-18 DIAGNOSIS — Z9481 Bone marrow transplant status: Secondary | ICD-10-CM | POA: Diagnosis not present

## 2016-11-18 DIAGNOSIS — C9001 Multiple myeloma in remission: Secondary | ICD-10-CM | POA: Diagnosis not present

## 2016-11-18 MED ORDER — DEXAMETHASONE 4 MG PO TABS: 4.0000 mg | ORAL_TABLET | ORAL | 9 refills | Status: DC

## 2016-11-18 MED ORDER — POMALIDOMIDE 3 MG PO CAPS: 3.0000 mg | ORAL_CAPSULE | Freq: Every day | ORAL | 11 refills | Status: DC

## 2016-11-18 MED ORDER — RANITIDINE HCL 150 MG PO TABS: 150.0000 mg | ORAL_TABLET | Freq: Every day | ORAL | 3 refills | Status: DC

## 2016-11-18 MED FILL — DEXAMETHASONE 4 MG TABLET: 4 | 28 days supply | Qty: 4 | Fill #0

## 2016-11-18 MED FILL — raNITIdine HCL 150 MG TABS: 150 | 30 days supply | Qty: 30 | Fill #0 | Status: TO

## 2016-11-18 NOTE — Assessment & Plan Note (Addendum)
I have reviewed treatment recommendation from [facility] I reviewed the bone marrow biopsy result with the patient and his wife Even though the bone marrow biopsy detected no residual myeloma, FISH analysis came back positive I suspect he has residual disease His present station was quite aggressive I agree with recommendation to start him on treatment This is based on recent FDA approval and publications as follows  We discussed the role of treatment is strictly palliative  Daratumumab plus pomalidomide and dexamethasone in relapsed and/or refractory multiple myeloma Jacklynn Barnacle, Clare Charon, Angeline Slim, Ellis Parents, York Pellant, Arlan Organ. Ifthikharuddin, Pernell Dupre. Theresia Majors, Rhetta Mura Comenzo, Jianping Wang, Kerri Nottage, Audry Riles, Nushmia ZDelories Heinz, Lynetta Mare and Almyra Free  Blood 2017 :[id number]; doi: [email or web address]   Daratumumab plus pomalidomide/dexamethasone (pom-dex) was evaluated in patients with relapsed/refractory multiple myeloma with ?2 prior lines of therapy, and who were refractory to their last treatment.   Patients received daratumumab 16 mg/kg at the recommended dosing schedule, pomalidomide 4 mg daily for 21 days of each 28-day cycle, and dexamethasone 40 mg weekly. Safety was the primary endpoint.   Overall response rate (ORR) and minimal residual disease (MRD) by next-generation sequencing were secondary endpoints. Patients (N = 103) received a median (range) of 4 (1-13) prior therapies; 76% received ?3 prior therapies. The safety profile of daratumumab plus pom-dex was similar to that of pom-dex alone, with the exception of daratumumab-specific infusion-related reactions (50%) and a higher incidence of neutropenia, although without an increase in infection rate. Common grade ?3 adverse events were neutropenia (78%), anemia (28%), and  leukopenia (24%). ORR was 60% and was generally consistent across subgroups (58% in double-refractory patients).   Among patients with a complete response or better, 29% were MRD-negative at a threshold of 10-5. Among the 62 responders, median duration of response was not estimable (NE; 95% CI, 13.6-NE). At a median follow-up of 13.1 months, the median progression-free survival was 8.8 (95% CI, 4.6-15.4) months and median overall survival was 17.5 (95% CI, 13.3-NE) months. The estimated 77-monthsurvival rate was 66% (95% CI, 55.6-74.8). Aside from increased neutropenia, the safety profile of daratumumab plus pom-dex was consistent with that of the individual therapies. Deep, durable responses were observed in heavily treated patients  Infusion reactions are common side effects.  Some of the short term side-effects included, though not limited to, risk of fatigue, weight loss, tumor lysis syndrome, risk of allergic reactions, pancytopenia, risk of blood clots, life-threatening infections, need for transfusions of blood products, admission to hospital for various reasons, and risks of death.   The patient is aware that the response rates discussed earlier is not guaranteed.    After a long discussion, patient made an informed decision to proceed with the prescribed plan of care.  The patient is instructed to start aspirin therapy We will get insurance authorization to get Pomalyst approval; I will start him on 3 mg dose due to extensive exposure to chemotherapy.  If he can tolerate 3 mg, we can consider increasing it to 4 mg in the future Plan to start him on treatment around November 29, 2016 He would need dental evaluation prior to next dose of Zometa He will continue calcium with vitamin D supplement I will start him on pulse dose dexamethasone 20 mg weekly

## 2016-11-18 NOTE — Assessment & Plan Note (Signed)
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 We'll continue the same He is on calcium and vitamin D supplement 

## 2016-11-18 NOTE — Progress Notes (Signed)
START ON PATHWAY REGIMEN - Multiple Myeloma     A cycle is every 28 days:     Daratumumab      Pomalidomide      Dexamethasone      Dexamethasone   **Always confirm dose/schedule in your pharmacy ordering system**    Patient Characteristics: Relapsed / Refractory, All Lines of Therapy R-ISS Staging: III Disease Classification: Relapsed Line of Therapy: Second Line  Intent of Therapy: Non-Curative / Palliative Intent, Discussed with Patient

## 2016-11-18 NOTE — Progress Notes (Signed)
[place] OFFICE PROGRESS NOTE  Patient Care Team: Boykin Nearing, MD as PCP - General (Family Medicine) Reola Calkins, MD as Consulting Physician (Hematology and Oncology)  SUMMARY OF ONCOLOGIC HISTORY:   Multiple myeloma in remission [facility])   08/28/2015 Bone Marrow Biopsy    Accession: [id number] biopsy showed 58% myeloma involvement. Cytogenetics was normal and FISH showed Del 13q and t(11;14).       09/07/2015 - 01/22/2016 Chemotherapy    he received 6 cycles of treatment of Velcade and Revlimid with Dex       09/12/2015 - 09/16/2015 Hospital Admission    He was admitted to the hospital with sepsis, treatment was placed on hold and he received one dose of GCSF for leukopenia      01/12/2016 Adverse Reaction    Delay treatment due to neutropenia      01/27/2016 Imaging    ECHO at [facility] showed normal EF      01/27/2016 PET scan    PET scan at [facility] showed innumerable lytic lesions throughout      01/27/2016 Procedure    PFT is within normal limits at [facility]      01/28/2016 Bone Marrow Biopsy    Bone marrow at [facility] showed persistent plasma cell myeloma in a normocellularmarrow (30%) with 20% atypical plasma cells and myeloid hyperplasia.      02/11/2016 - 06/10/2016 Chemotherapy    He is started on salvage Rx with Kyprolis, Cytoxan and dexamethasone      03/24/2016 Imaging    US venous Doppler showed no evidence of deep vein or superficial thrombosis involving the right lower extremity and left lower extremity. No evidence of Baker&'s cyst on the right or left.      05/18/2016 - 05/19/2016 Hospital Admission    He was admitted for chest pain evaluation. Cardiac work-up excluded cardiac events and CT angiogram were negative for PE. It showed esphageal thickening c/w reflux esophagitis likely cause for his chest discomfort      05/23/2016 - 05/25/2016 Hospital Admission    Patient was hospitalized for worsening shortness of breath  and worsening pain. CT angiogram chest negative for pulmonary emboli. Patient had a recent normal 2-D echo and ruled out for MI. Patient noted to be dehydrated. Patient admitted placed on IV fluids. Diuretic discontinued. Patient's gabapentin has been increased per recommendations from prior office visit from patient's oncologist to 600 mg 3 times daily      06/15/2016 Bone Marrow Biopsy    Bone marrow biopsy showed only 2% plasma cell, normal FISH and cytogenetics      07/26/2016 - 07/26/2016 Chemotherapy    He received high dose melphalan at [facility]       07/27/2016 Bone Marrow Transplant    He received autologous stem cell transplant at [facility]      08/05/2016 - 08/10/2016 Hospital Admission    He was admitted to [facility] for management of neutropenic fever and diarrhea. Cultures were negative. Stool culture came back positive for Novovirus and he was managed conservatively.      11/03/2016 Bone Marrow Biopsy    Bone Marrow Flow Cytometry - NO MONOCLONAL B CELL POPULATION IDENTIFIED. - PREDOMINANCE OF T CELLS WITH NON SPECIFIC CHANGES. - SEE NOTE. Diagnosis Comment: Analysis of the lymphoid population shows predominance of T lymphocytes expressing pan T-cell antigens but with relative abundance of CD8 positive cells and reversal of the CD4:CD8 ratio. No significant CD16/56 expression is identified. The T-cell changes  are not considered specific in this setting. B cells represent the minor population with no monoclonality or abnormal phenotype. 4% lambda light chain restricted plasma cells. FISH positive CCND1/IGH 12% of cells.       11/09/2016 PET scan    No FDG avid myeloma is identified. Numerous osseous lytic lesions as above. 2.Hypermetabolic left level III lymph node, likely reactive etiology.  3.Ancillary CT findings as above.       INTERVAL HISTORY: Please see below for problem oriented charting. He returns with his wife to review test results and treatment  recommendation His bone pain is stable Denies worsening neuropathy No recent infection  REVIEW OF SYSTEMS:   Constitutional: Denies fevers, chills or abnormal weight loss Eyes: Denies blurriness of vision Ears, nose, mouth, throat, and face: Denies mucositis or sore throat Respiratory: Denies cough, dyspnea or wheezes Cardiovascular: Denies palpitation, chest discomfort or lower extremity swelling Gastrointestinal:  Denies nausea, heartburn or change in bowel habits Skin: Denies abnormal skin rashes Lymphatics: Denies new lymphadenopathy or easy bruising Neurological:Denies numbness, tingling or new weaknesses Behavioral/Psych: Mood is stable, no new changes  All other systems were reviewed with the patient and are negative.  I have reviewed the past medical history, past surgical history, social history and family history with the patient and they are unchanged from previous note.  ALLERGIES:  is allergic to benadryl [diphenhydramine]; trazodone and nefazodone; and latex.  MEDICATIONS:  Current Outpatient Prescriptions  Medication Sig Dispense Refill  . acyclovir (ZOVIRAX) 800 MG tablet Take 800 mg by mouth 2 (two) times daily.    Marland Kitchen CALCIUM PO Take 1 tablet by mouth daily.    . cholecalciferol (VITAMIN D) 1000 units tablet Take 1,000 Units by mouth daily.     . folic acid (FOLVITE) 1 MG tablet Take 1 mg by mouth daily.  1  . gabapentin (NEURONTIN) 300 MG capsule Take 2 capsules (600 mg total) by mouth 2 (two) times daily. 90 capsule 9  . lidocaine-prilocaine (EMLA) cream Apply to affected area once 30 g 3  . lisinopril (PRINIVIL,ZESTRIL) 20 MG tablet Take 1 tablet (20 mg total) by mouth daily. 30 tablet 6  . Multiple Vitamin (MULTIVITAMIN) tablet Take 1 tablet by mouth daily.  1  . ranitidine (ZANTAC) 150 MG tablet Take 1 tablet (150 mg total) by mouth at bedtime. 30 tablet 3  . sulfamethoxazole-trimethoprim (BACTRIM DS,SEPTRA DS) 800-160 MG tablet Take 1 tablet by mouth 3 (three)  times a week.  1  . traMADol (ULTRAM) 50 MG tablet TAKE 1 TO 2 TABLETS BY MOUTH EVERY 6 HOURS AS NEEDED FOR PAIN 90 tablet 0  . zolpidem (AMBIEN) 10 MG tablet Take 1 tablet (10 mg total) by mouth at bedtime as needed for sleep. 30 tablet 3  . aluminum-magnesium hydroxide-simethicone (MAALOX) 585-277-82 MG/5ML SUSP Take 30 mLs by mouth every 4 (four) hours as needed.    Marland Kitchen amLODipine (NORVASC) 5 MG tablet Take 1 tablet (5 mg total) by mouth daily. (Patient not taking: Reported on 11/18/2016) 30 tablet 6  . dexamethasone (DECADRON) 4 MG tablet Take 1 tablet (4 mg total) by mouth once a week. 60 tablet 9  . loperamide (IMODIUM) 2 MG capsule Take 2 mg by mouth 4 (four) times daily as needed.    . nystatin (MYCOSTATIN) 100000 UNIT/ML suspension Take 5 mLs by mouth 4 (four) times daily as needed.    . ondansetron (ZOFRAN) 8 MG tablet Take 8 mg by mouth every 8 (eight) hours as needed.    Marland Kitchen  pantoprazole (PROTONIX) 40 MG tablet Take 1 tablet (40 mg total) by mouth daily. (Patient not taking: Reported on 11/18/2016) 30 tablet 6  . pomalidomide (POMALYST) 3 MG capsule Take 1 capsule (3 mg total) by mouth daily. Take with water on days 1-21. Repeat every 28 days. 21 capsule 11  . prochlorperazine (COMPAZINE) 10 MG tablet Take 10 mg by mouth every 6 (six) hours as needed.     No current facility-administered medications for this visit.     PHYSICAL EXAMINATION: ECOG PERFORMANCE STATUS: 1 - Symptomatic but completely ambulatory  Vitals:   11/18/16 1017  BP: 116/75  Pulse: 99  Resp: 18  Temp: 97.3 F (36.3 C)   Filed Weights   11/18/16 1017  Weight: 168 lb 4.8 oz (76.3 kg)    GENERAL:alert, no distress and comfortable SKIN: skin color, texture, turgor are normal, no rashes or significant lesions EYES: normal, Conjunctiva are pink and non-injected, sclera clear Musculoskeletal:no cyanosis of digits and no clubbing  NEURO: alert & oriented x 3 with fluent speech, no focal motor/sensory  deficits  LABORATORY DATA:  I have reviewed the data as listed    Component Value Date/Time   NA 142 10/11/2016 0951   K 4.0 10/11/2016 0951   CL 109 06/15/2016 0720   CO2 25 10/11/2016 0951   GLUCOSE 99 10/11/2016 0951   BUN 13.6 10/11/2016 0951   CREATININE 1.1 10/11/2016 0951   CALCIUM 9.3 10/11/2016 0951   PROT 6.3 10/11/2016 0952   PROT 6.7 10/11/2016 0951   ALBUMIN 3.9 10/11/2016 0951   AST 39 (H) 10/11/2016 0951   ALT 25 10/11/2016 0951   ALKPHOS 44 10/11/2016 0951   BILITOT 0.53 10/11/2016 0951   GFRNONAA >60 06/15/2016 0720   GFRNONAA 71 07/24/2015 1155   GFRAA >60 06/15/2016 0720   GFRAA 82 07/24/2015 1155    No results found for: SPEP, UPEP  Lab Results  Component Value Date   WBC 4.7 11/02/2016   NEUTROABS 2.2 11/02/2016   HGB 12.3 (L) 11/02/2016   HCT 35.5 (L) 11/02/2016   MCV 87.7 11/02/2016   PLT 187 11/02/2016      Chemistry      Component Value Date/Time   NA 142 10/11/2016 0951   K 4.0 10/11/2016 0951   CL 109 06/15/2016 0720   CO2 25 10/11/2016 0951   BUN 13.6 10/11/2016 0951   CREATININE 1.1 10/11/2016 0951      Component Value Date/Time   CALCIUM 9.3 10/11/2016 0951   ALKPHOS 44 10/11/2016 0951   AST 39 (H) 10/11/2016 0951   ALT 25 10/11/2016 0951   BILITOT 0.53 10/11/2016 0951       RADIOGRAPHIC STUDIES: I have personally reviewed the radiological images as listed and agreed with the findings in the report. Ct Biopsy  Result Date: 11/02/2016 INDICATION: 61 year old male with multiple myeloma currently in remission. He presents for follow-up bone marrow biopsy. EXAM: CT GUIDED BONE MARROW ASPIRATION AND CORE BIOPSY Interventional Radiologist:  Criselda Peaches, MD MEDICATIONS: None. ANESTHESIA/SEDATION: Moderate (conscious) sedation was employed during this procedure. A total of 5 milligrams versed and 75 micrograms fentanyl were administered intravenously. The patient's level of consciousness and vital signs were monitored  continuously by radiology nursing throughout the procedure under my direct supervision. Total monitored sedation time: 13 minutes FLUOROSCOPY TIME:  Fluoroscopy Time: 0 minutes 0 seconds (0 mGy). COMPLICATIONS: None immediate. Estimated blood loss: <25 mL PROCEDURE: Informed written consent was obtained from the patient after a thorough discussion  of the procedural risks, benefits and alternatives. All questions were addressed. Maximal Sterile Barrier Technique was utilized including caps, mask, sterile gowns, sterile gloves, sterile drape, hand hygiene and skin antiseptic. A timeout was performed prior to the initiation of the procedure. The patient was positioned prone and non-contrast localization CT was performed of the pelvis to demonstrate the iliac marrow spaces. Maximal barrier sterile technique utilized including caps, mask, sterile gowns, sterile gloves, large sterile drape, hand hygiene, and betadine prep. Under sterile conditions and local anesthesia, an 11 gauge coaxial bone biopsy needle was advanced into the right iliac marrow space. Needle position was confirmed with CT imaging. Initially, bone marrow aspiration was performed. Next, the 11 gauge outer cannula was utilized to obtain a right iliac bone marrow core biopsy. Needle was removed. Hemostasis was obtained with compression. The patient tolerated the procedure well. Samples were prepared with the cytotechnologist. IMPRESSION: Successful CT-guided bone marrow biopsy. Signed, Criselda Peaches, MD [facility] Radiology Electronically Signed   By: Jacqulynn Cadet M.D.   On: 11/02/2016 09:48   Ct Bone Marrow Biopsy & Aspiration  Result Date: 11/02/2016 INDICATION: 61 year old male with multiple myeloma currently in remission. He presents for follow-up bone marrow biopsy. EXAM: CT GUIDED BONE MARROW ASPIRATION AND CORE BIOPSY Interventional Radiologist:  Criselda Peaches, MD MEDICATIONS:  None. ANESTHESIA/SEDATION: Moderate (conscious) sedation was employed during this procedure. A total of 5 milligrams versed and 75 micrograms fentanyl were administered intravenously. The patient's level of consciousness and vital signs were monitored continuously by radiology nursing throughout the procedure under my direct supervision. Total monitored sedation time: 13 minutes FLUOROSCOPY TIME:  Fluoroscopy Time: 0 minutes 0 seconds (0 mGy). COMPLICATIONS: None immediate. Estimated blood loss: <25 mL PROCEDURE: Informed written consent was obtained from the patient after a thorough discussion of the procedural risks, benefits and alternatives. All questions were addressed. Maximal Sterile Barrier Technique was utilized including caps, mask, sterile gowns, sterile gloves, sterile drape, hand hygiene and skin antiseptic. A timeout was performed prior to the initiation of the procedure. The patient was positioned prone and non-contrast localization CT was performed of the pelvis to demonstrate the iliac marrow spaces. Maximal barrier sterile technique utilized including caps, mask, sterile gowns, sterile gloves, large sterile drape, hand hygiene, and betadine prep. Under sterile conditions and local anesthesia, an 11 gauge coaxial bone biopsy needle was advanced into the right iliac marrow space. Needle position was confirmed with CT imaging. Initially, bone marrow aspiration was performed. Next, the 11 gauge outer cannula was utilized to obtain a right iliac bone marrow core biopsy. Needle was removed. Hemostasis was obtained with compression. The patient tolerated the procedure well. Samples were prepared with the cytotechnologist. IMPRESSION: Successful CT-guided bone marrow biopsy. Signed, Criselda Peaches, MD [facility] Radiology Electronically Signed   By: Jacqulynn Cadet M.D.   On: 11/02/2016 09:48    ASSESSMENT & PLAN:  Multiple myeloma in remission  ([place]) I have reviewed treatment recommendation from [facility] I reviewed the bone marrow biopsy result with the patient and his wife Even though the bone marrow biopsy detected no residual myeloma, FISH analysis came back positive I suspect he has residual disease His present station was quite aggressive I agree with recommendation to start him on treatment This is based on recent FDA approval and publications as follows  We discussed the role of treatment is strictly palliative  Daratumumab plus pomalidomide and dexamethasone in relapsed  and/or refractory multiple myeloma Jacklynn Barnacle, Clare Charon, Angeline Slim, Ellis Parents, York Pellant, Arlan Organ Ifthikharuddin, Pernell Dupre. Theresia Majors, Rhetta Mura Comenzo, Jianping Wang, Kerri Nottage, Audry Riles, Nushmia ZDelories Heinz, Lynetta Mare and Almyra Free  Blood 2017 :[id number]; doi: [email or web address]   Daratumumab plus pomalidomide/dexamethasone (pom-dex) was evaluated in patients with relapsed/refractory multiple myeloma with ?2 prior lines of therapy, and who were refractory to their last treatment.   Patients received daratumumab 16 mg/kg at the recommended dosing schedule, pomalidomide 4 mg daily for 21 days of each 28-day cycle, and dexamethasone 40 mg weekly. Safety was the primary endpoint.   Overall response rate (ORR) and minimal residual disease (MRD) by next-generation sequencing were secondary endpoints. Patients (N = 103) received a median (range) of 4 (1-13) prior therapies; 76% received ?3 prior therapies. The safety profile of daratumumab plus pom-dex was similar to that of pom-dex alone, with the exception of daratumumab-specific infusion-related reactions (50%) and a higher incidence of neutropenia, although without an increase in infection rate. Common grade ?3 adverse events were neutropenia (78%), anemia (28%),  and leukopenia (24%). ORR was 60% and was generally consistent across subgroups (58% in double-refractory patients).   Among patients with a complete response or better, 29% were MRD-negative at a threshold of 10-5. Among the 62 responders, median duration of response was not estimable (NE; 95% CI, 13.6-NE). At a median follow-up of 13.1 months, the median progression-free survival was 8.8 (95% CI, 4.6-15.4) months and median overall survival was 17.5 (95% CI, 13.3-NE) months. The estimated 20-monthsurvival rate was 66% (95% CI, 55.6-74.8). Aside from increased neutropenia, the safety profile of daratumumab plus pom-dex was consistent with that of the individual therapies. Deep, durable responses were observed in heavily treated patients  Infusion reactions are common side effects.  Some of the short term side-effects included, though not limited to, risk of fatigue, weight loss, tumor lysis syndrome, risk of allergic reactions, pancytopenia, risk of blood clots, life-threatening infections, need for transfusions of blood products, admission to hospital for various reasons, and risks of death.   The patient is aware that the response rates discussed earlier is not guaranteed.    After a long discussion, patient made an informed decision to proceed with the prescribed plan of care.  The patient is instructed to start aspirin therapy We will get insurance authorization to get Pomalyst approval; I will start him on 3 mg dose due to extensive exposure to chemotherapy.  If he can tolerate 3 mg, we can consider increasing it to 4 mg in the future Plan to start him on treatment around November 29, 2016 He would need dental evaluation prior to next dose of Zometa He will continue calcium with vitamin D supplement I will start him on pulse dose dexamethasone 20 mg weekly     Bone pain His prior peripheral neuropathy and chronic pain has drastically improved since bone marrow transplant He is currently  taking tramadol as needed. We'll continue the same He is on calcium and vitamin D supplement  S/P [facility] bone marrow [facility] ([facility] He will continue antimicrobial prophylaxis as directed by [place] [facility] [place] [facility] We will continue to provide supportive care. [facility]  Goals of care, counseling/discussion The patient is aware he has incurable disease and treatment is strictly palliative.   Orders Placed This Encounter  Procedures  . CBC with Differential    Standing Status:   Standing  Number of Occurrences:   20    Standing Expiration Date:   11/19/2017  . Comprehensive metabolic panel    Standing Status:   Standing    Number of Occurrences:   20    Standing Expiration Date:   11/19/2017  . PHYSICIAN COMMUNICATION ORDER    Type and Screen patients prior to starting daratumumab. Administration of daratumumab results in a positive Indirect Antiglobulin Test (Coombs test) for up to 6 months after the last infusion. Notify blood transfusion centers of this interference with serological testing and inform blood banks that a patient has recevied daratumumab  . Type and screen    Standing Status:   Future    Standing Expiration Date:   11/18/2017   All questions were answered. The patient knows to call the clinic with any problems, questions or concerns. No barriers to learning was detected. I spent 30 minutes counseling the patient face to face. The total time spent in the appointment was 40 minutes and more than 50% was on counseling and review of test results     Heath Lark, MD 11/18/2016 3:50 PM

## 2016-11-18 NOTE — Assessment & Plan Note (Signed)
The patient is aware he has incurable disease and treatment is strictly palliative. 

## 2016-11-18 NOTE — Assessment & Plan Note (Signed)
He will continue antimicrobial prophylaxis as directed by his physicians at [place]. We will continue to provide supportive care. He will receive his transplant vaccination at [facility] in June

## 2016-11-18 NOTE — Telephone Encounter (Signed)
Gave patient AVS and calender per . - unable to schedule 4/24 appt and MD appts ( in the treatment area) per 11/18/2016 los but will add and f/u and call patient once added.Patient aware and will pick up new schedule as well next visit.

## 2016-11-21 ENCOUNTER — Encounter: Payer: Self-pay | Admitting: Family Medicine

## 2016-11-21 ENCOUNTER — Ambulatory Visit: Payer: Medicaid Other | Attending: Family Medicine | Admitting: Family Medicine

## 2016-11-21 VITALS — BP 130/85 | HR 99 | Temp 98.5°F | Ht 66.0 in | Wt 167.0 lb

## 2016-11-21 DIAGNOSIS — Z9481 Bone marrow transplant status: Secondary | ICD-10-CM

## 2016-11-21 DIAGNOSIS — Z91843 Risk for dental caries, high: Secondary | ICD-10-CM | POA: Diagnosis not present

## 2016-11-21 DIAGNOSIS — T451X5A Adverse effect of antineoplastic and immunosuppressive drugs, initial encounter: Secondary | ICD-10-CM | POA: Diagnosis not present

## 2016-11-21 DIAGNOSIS — H269 Unspecified cataract: Secondary | ICD-10-CM | POA: Insufficient documentation

## 2016-11-21 DIAGNOSIS — N4830 Priapism, unspecified: Secondary | ICD-10-CM | POA: Diagnosis not present

## 2016-11-21 DIAGNOSIS — Z79899 Other long term (current) drug therapy: Secondary | ICD-10-CM | POA: Insufficient documentation

## 2016-11-21 DIAGNOSIS — G620 Drug-induced polyneuropathy: Secondary | ICD-10-CM | POA: Insufficient documentation

## 2016-11-21 DIAGNOSIS — C9001 Multiple myeloma in remission: Secondary | ICD-10-CM | POA: Diagnosis not present

## 2016-11-21 DIAGNOSIS — I10 Essential (primary) hypertension: Secondary | ICD-10-CM | POA: Diagnosis not present

## 2016-11-21 DIAGNOSIS — Z79891 Long term (current) use of opiate analgesic: Secondary | ICD-10-CM | POA: Diagnosis not present

## 2016-11-21 DIAGNOSIS — H268 Other specified cataract: Secondary | ICD-10-CM

## 2016-11-21 DIAGNOSIS — Z9889 Other specified postprocedural states: Secondary | ICD-10-CM | POA: Insufficient documentation

## 2016-11-21 NOTE — Progress Notes (Signed)
Subjective:  Patient ID: Erik Nguyen, male    DOB: [birth year]  Age: 61 y.o. MRN: [id number]  CC: Transplant Donor Follow-up (07/27/16 stem cell transport); dental referral; and Multiple Myeloma   HPI Erik Nguyen is a  61 year old male with a history of hypertension, multiple myeloma (status post autologous stem cell transplant in 07/2016) who presents today for a follow-up visit. Also has chemotherapy-induced neuropathy and remains on a PPI for GERD. Last visit to his oncologist at the cancer center was 3 days ago and he is followed by [facility] every 3 months. As per oncology bone marrow biopsy was negative FISH analysis was positive ;goal of treatment is strictly palliative as he does have some residual disease and presentation was aggressive.  Dental evaluation is requested prior to next dose of Zometa and he is requesting referral to ophthalmology due to presence of bilateral cataracts. He was previously seen at [facility].   Denies bone pains at this time and states he feels fine overall.  Past Medical History:  Diagnosis Date  . Encounter for antineoplastic chemotherapy 02/09/2016  . History of syphilis 02/24/2016  . Hypertension Dx 2016  . Multiple myeloma not having achieved remission ([place]) 09/01/2015  . Priapism 2014     Past Surgical History:  Procedure Laterality Date  . COLONOSCOPY    . POLYPECTOMY      Allergies  Allergen Reactions  . Benadryl [Diphenhydramine] Other (See Comments)    priapism  . Trazodone And Nefazodone     priapism  . Latex Itching and Rash     Outpatient Medications Prior to Visit  Medication Sig Dispense Refill  . acyclovir (ZOVIRAX) 800 MG tablet Take 800 mg by mouth 2 (two) times daily.    Marland Kitchen aluminum-magnesium hydroxide-simethicone (MAALOX) 174-944-96 MG/5ML SUSP Take 30 mLs by mouth every 4 (four) hours as needed.    Marland Kitchen CALCIUM PO Take 1 tablet by mouth daily.    . cholecalciferol (VITAMIN D) 1000 units tablet  Take 1,000 Units by mouth daily.     . folic acid (FOLVITE) 1 MG tablet Take 1 mg by mouth daily.  1  . gabapentin (NEURONTIN) 300 MG capsule Take 2 capsules (600 mg total) by mouth 2 (two) times daily. 90 capsule 9  . lidocaine-prilocaine (EMLA) cream Apply to affected area once 30 g 3  . lisinopril (PRINIVIL,ZESTRIL) 20 MG tablet Take 1 tablet (20 mg total) by mouth daily. 30 tablet 6  . loperamide (IMODIUM) 2 MG capsule Take 2 mg by mouth 4 (four) times daily as needed.    . Multiple Vitamin (MULTIVITAMIN) tablet Take 1 tablet by mouth daily.  1  . ondansetron (ZOFRAN) 8 MG tablet Take 8 mg by mouth every 8 (eight) hours as needed.    . pantoprazole (PROTONIX) 40 MG tablet Take 1 tablet (40 mg total) by mouth daily. 30 tablet 6  . prochlorperazine (COMPAZINE) 10 MG tablet Take 10 mg by mouth every 6 (six) hours as needed.    . ranitidine (ZANTAC) 150 MG tablet Take 1 tablet (150 mg total) by mouth at bedtime. 30 tablet 3  . sulfamethoxazole-trimethoprim (BACTRIM DS,SEPTRA DS) 800-160 MG tablet Take 1 tablet by mouth 3 (three) times a week.  1  . traMADol (ULTRAM) 50 MG tablet TAKE 1 TO 2 TABLETS BY MOUTH EVERY 6 HOURS AS NEEDED FOR PAIN 90 tablet 0  . zolpidem (AMBIEN) 10 MG tablet Take 1 tablet (10 mg total) by mouth at bedtime as needed  for sleep. 30 tablet 3  . dexamethasone (DECADRON) 4 MG tablet Take 1 tablet (4 mg total) by mouth once a week. (Patient not taking: Reported on 11/21/2016) 60 tablet 9  . nystatin (MYCOSTATIN) 100000 UNIT/ML suspension Take 5 mLs by mouth 4 (four) times daily as needed.    . pomalidomide (POMALYST) 3 MG capsule Take 1 capsule (3 mg total) by mouth daily. Take with water on days 1-21. Repeat every 28 days. (Patient not taking: Reported on 11/21/2016) 21 capsule 11  . amLODipine (NORVASC) 5 MG tablet Take 1 tablet (5 mg total) by mouth daily. (Patient not taking: Reported on 11/18/2016) 30 tablet 6   No facility-administered medications prior to visit.      ROS Review of Systems  Constitutional: Negative for activity change and appetite change.  HENT: Negative for sinus pressure and sore throat.   Eyes: Negative for visual disturbance.  Respiratory: Negative for cough, chest tightness and shortness of breath.   Cardiovascular: Negative for chest pain and leg swelling.  Gastrointestinal: Negative for abdominal distention, abdominal pain, constipation and diarrhea.  Endocrine: Negative.   Genitourinary: Negative for dysuria.  Musculoskeletal: Negative for joint swelling and myalgias.  Skin: Negative for rash.  Allergic/Immunologic: Negative.   Neurological: Negative for weakness, light-headedness and numbness.  Psychiatric/Behavioral: Negative for dysphoric mood and suicidal ideas.    Objective:  BP 130/85 (BP Location: Right Arm, Patient Position: Sitting, Cuff Size: Small)   Pulse 99   Temp 98.5 F (36.9 C) (Oral)   Ht 5' 6"  (1.676 m)   Wt 167 lb (75.8 kg)   SpO2 99%   BMI 26.95 kg/m   BP/Weight 11/21/2016 11/18/2016 9/67/8938  Systolic BP 101 751 025  Diastolic BP 85 75 71  Wt. (Lbs) 167 168.3 -  BMI 26.95 27.16 -      Physical Exam  Constitutional: He is oriented to person, place, and time. He appears well-developed and well-nourished.  HENT:  Right Ear: External ear normal.  Left Ear: External ear normal.  Cardiovascular: Normal rate, normal heart sounds and intact distal pulses.   No murmur heard. Pulmonary/Chest: Effort normal and breath sounds normal. He has no wheezes. He has no rales. He exhibits no tenderness.  Abdominal: Soft. Bowel sounds are normal. He exhibits no distension and no mass. There is no tenderness.  Musculoskeletal: Normal range of motion.  Neurological: He is alert and oriented to person, place, and time.  Skin: Skin is warm and dry.  Psychiatric: He has a normal mood and affect.     Assessment & Plan:   1. S/P autologous bone marrow transplantation [facility]) Followed by [facility]  2. Peripheral neuropathy due to chemotherapy [facility]) Currently on gabapentin  3. Essential hypertension Controlled Continue lisinopril  4. Multiple myeloma in remission [facility]) s/p autologous stem cell transplant To start treatment on 11/29/16. Continue antimicrobial prophylaxis with Bactrim - Ambulatory referral to Ophthalmology  5. At high risk for dental caries Needs dental evaluation prior to Zometa - Ambulatory referral to Dentistry  6. Other cataract, unspecified laterality - Ambulatory referral to Ophthalmology   No orders of the defined types were placed in this encounter.   Follow-up: Return in about 3 months (around 02/20/2017) for coordination of care.   Arnoldo Morale MD

## 2016-11-22 ENCOUNTER — Telehealth: Payer: Self-pay | Admitting: Pharmacist

## 2016-11-22 NOTE — Telephone Encounter (Signed)
Oral Chemotherapy Pharmacist Encounter  Received new prescription for Pomalyst for patient to take in conjunction with daratumumab for the treatment of relapsed multiple myeloma Noted Pomalyst dose decreased to 21m daily for treatment of initiation, possible increase to 4877mif 77m68ms tolerated.  11/02/16 CBC and 10/11/16 CMET reviewed, OK for treatment Current medication list assessed, no significant DDIs with Pomalyst identified  Prescription, insurance, demographic, and clinical information faxed to [employer] [facility] (ph: [phone]: [phone]  JesJohny DrillingharmD, BCPS, BCOP 11/22/2016  2:21 PM Oral Oncology Clinic [phone]

## 2016-11-24 ENCOUNTER — Telehealth: Payer: Self-pay | Admitting: Hematology and Oncology

## 2016-11-24 NOTE — Telephone Encounter (Signed)
Left message for patient about 4/24 added infusion appt. Message had appt date and time- patient ot pick up new schedule on 4/17 visit.

## 2016-11-28 ENCOUNTER — Ambulatory Visit (HOSPITAL_COMMUNITY)
Admission: RE | Admit: 2016-11-28 | Discharge: 2016-11-28 | Disposition: A | Discharge status: Home / Self Care | Payer: Medicaid Other | Source: Ambulatory Visit | Attending: Hematology and Oncology | Admitting: Hematology and Oncology

## 2016-11-28 DIAGNOSIS — C9001 Multiple myeloma in remission: Secondary | ICD-10-CM | POA: Insufficient documentation

## 2016-11-29 ENCOUNTER — Other Ambulatory Visit: Payer: Self-pay | Admitting: Hematology and Oncology

## 2016-11-29 ENCOUNTER — Other Ambulatory Visit (HOSPITAL_BASED_OUTPATIENT_CLINIC_OR_DEPARTMENT_OTHER): Payer: Medicaid Other

## 2016-11-29 ENCOUNTER — Ambulatory Visit (HOSPITAL_BASED_OUTPATIENT_CLINIC_OR_DEPARTMENT_OTHER): Payer: Medicaid Other

## 2016-11-29 ENCOUNTER — Ambulatory Visit: Payer: Medicaid Other

## 2016-11-29 VITALS — BP 143/92 | HR 73 | Temp 98.1°F | Resp 18

## 2016-11-29 DIAGNOSIS — C9001 Multiple myeloma in remission: Secondary | ICD-10-CM

## 2016-11-29 DIAGNOSIS — G620 Drug-induced polyneuropathy: Secondary | ICD-10-CM

## 2016-11-29 DIAGNOSIS — Z5112 Encounter for antineoplastic immunotherapy: Secondary | ICD-10-CM | POA: Diagnosis present

## 2016-11-29 DIAGNOSIS — D61810 Antineoplastic chemotherapy induced pancytopenia: Secondary | ICD-10-CM

## 2016-11-29 DIAGNOSIS — Z8042 Family history of malignant neoplasm of prostate: Secondary | ICD-10-CM

## 2016-11-29 DIAGNOSIS — M898X9 Other specified disorders of bone, unspecified site: Secondary | ICD-10-CM

## 2016-11-29 DIAGNOSIS — T451X5A Adverse effect of antineoplastic and immunosuppressive drugs, initial encounter: Secondary | ICD-10-CM

## 2016-11-29 DIAGNOSIS — G893 Neoplasm related pain (acute) (chronic): Secondary | ICD-10-CM

## 2016-11-29 LAB — CBC WITH DIFFERENTIAL/PLATELET
BASO%: 0.2 % (ref 0.0–2.0)
BASOS ABS: 0 10*3/uL (ref 0.0–0.1)
EOS%: 0 % (ref 0.0–7.0)
Eosinophils Absolute: 0 10*3/uL (ref 0.0–0.5)
HCT: 36.1 % — ABNORMAL LOW (ref 38.4–49.9)
HGB: 12.6 g/dL — ABNORMAL LOW (ref 13.0–17.1)
LYMPH%: 27.6 % (ref 14.0–49.0)
MCH: 31 pg (ref 27.2–33.4)
MCHC: 34.9 g/dL (ref 32.0–36.0)
MCV: 88.9 fL (ref 79.3–98.0)
MONO#: 0.7 10*3/uL (ref 0.1–0.9)
MONO%: 13.1 % (ref 0.0–14.0)
NEUT#: 2.9 10*3/uL (ref 1.5–6.5)
NEUT%: 59.1 % (ref 39.0–75.0)
Platelets: 179 10*3/uL (ref 140–400)
RBC: 4.06 10*6/uL — AB (ref 4.20–5.82)
RDW: 14 % (ref 11.0–14.6)
WBC: 5 10*3/uL (ref 4.0–10.3)
lymph#: 1.4 10*3/uL (ref 0.9–3.3)

## 2016-11-29 LAB — TYPE AND SCREEN
ABO/RH(D): A POS
Antibody Screen: NEGATIVE
DAT, IgG: NEGATIVE

## 2016-11-29 LAB — COMPREHENSIVE METABOLIC PANEL
ALT: 19 U/L (ref 0–55)
AST: 17 U/L (ref 5–34)
Albumin: 4.3 g/dL (ref 3.5–5.0)
Alkaline Phosphatase: 48 U/L (ref 40–150)
Anion Gap: 13 mEq/L — ABNORMAL HIGH (ref 3–11)
BUN: 23.1 mg/dL (ref 7.0–26.0)
CALCIUM: 9.5 mg/dL (ref 8.4–10.4)
CHLORIDE: 110 meq/L — AB (ref 98–109)
CO2: 20 meq/L — AB (ref 22–29)
CREATININE: 1.3 mg/dL (ref 0.7–1.3)
EGFR: 72 mL/min/{1.73_m2} — AB (ref >90)
Glucose: 155 mg/dl — ABNORMAL HIGH (ref 70–140)
Potassium: 3.9 mEq/L (ref 3.5–5.1)
SODIUM: 143 meq/L (ref 136–145)
Total Bilirubin: 0.46 mg/dL (ref 0.20–1.20)
Total Protein: 7.2 g/dL (ref 6.4–8.3)

## 2016-11-29 MED ORDER — ACETAMINOPHEN 325 MG PO TABS
650.0000 mg | ORAL_TABLET | Freq: Once | ORAL | Status: AC
  Administered 2016-11-29: 650 mg via ORAL

## 2016-11-29 MED ORDER — DEXAMETHASONE 4 MG PO TABS: 20.0000 mg | ORAL_TABLET | ORAL | 9 refills | Status: DC

## 2016-11-29 MED ORDER — ONDANSETRON HCL 4 MG/2ML IJ SOLN
INTRAMUSCULAR | Status: AC
  Filled 2016-11-29: qty 2

## 2016-11-29 MED ORDER — SODIUM CHLORIDE 0.9 % IV SOLN
Freq: Once | INTRAVENOUS | Status: AC
  Administered 2016-11-29: 09:00:00 via INTRAVENOUS

## 2016-11-29 MED ORDER — SODIUM CHLORIDE 0.9 % IV SOLN
15.8000 mg/kg | Freq: Once | INTRAVENOUS | Status: AC
  Administered 2016-11-29: 1200 mg via INTRAVENOUS
  Filled 2016-11-29: qty 60

## 2016-11-29 MED ORDER — SODIUM CHLORIDE 0.9 % IV SOLN
20.0000 mg | Freq: Once | INTRAVENOUS | Status: AC
  Administered 2016-11-29: 20 mg via INTRAVENOUS
  Filled 2016-11-29: qty 2

## 2016-11-29 MED ORDER — SODIUM CHLORIDE 0.9% FLUSH
10.0000 mL | INTRAVENOUS | Status: DC | PRN
  Administered 2016-11-29: 10 mL
  Filled 2016-11-29: qty 10

## 2016-11-29 MED ORDER — MONTELUKAST SODIUM 10 MG PO TABS
10.0000 mg | ORAL_TABLET | Freq: Once | ORAL | Status: AC
  Administered 2016-11-29: 10 mg via ORAL

## 2016-11-29 MED ORDER — LIDOCAINE-PRILOCAINE 2.5-2.5 % EX CREA: 1.0000 "application " | TOPICAL_CREAM | CUTANEOUS | 6 refills | Status: DC | PRN

## 2016-11-29 MED ORDER — PROCHLORPERAZINE MALEATE 10 MG PO TABS
10.0000 mg | ORAL_TABLET | Freq: Once | ORAL | Status: AC
  Administered 2016-11-29: 10 mg via ORAL

## 2016-11-29 MED ORDER — FAMOTIDINE IN NACL 20-0.9 MG/50ML-% IV SOLN
20.0000 mg | Freq: Once | INTRAVENOUS | Status: AC
  Administered 2016-11-29: 20 mg via INTRAVENOUS

## 2016-11-29 MED ORDER — ONDANSETRON HCL 4 MG/2ML IJ SOLN
8.0000 mg | Freq: Once | INTRAMUSCULAR | Status: AC
  Administered 2016-11-29: 8 mg via INTRAVENOUS

## 2016-11-29 MED ORDER — FAMOTIDINE IN NACL 20-0.9 MG/50ML-% IV SOLN
INTRAVENOUS | Status: AC
  Filled 2016-11-29: qty 50

## 2016-11-29 MED ORDER — MONTELUKAST SODIUM 10 MG PO TABS
ORAL_TABLET | ORAL | Status: AC
  Filled 2016-11-29: qty 1

## 2016-11-29 MED ORDER — HEPARIN SOD (PORK) LOCK FLUSH 100 UNIT/ML IV SOLN
500.0000 [IU] | Freq: Once | INTRAVENOUS | Status: AC | PRN
  Administered 2016-11-29: 500 [IU]
  Filled 2016-11-29: qty 5

## 2016-11-29 MED ORDER — ZOLPIDEM TARTRATE 10 MG PO TABS: 10.0000 mg | ORAL_TABLET | Freq: Every evening | ORAL | 3 refills | Status: DC | PRN

## 2016-11-29 MED ORDER — ACETAMINOPHEN 325 MG PO TABS
ORAL_TABLET | ORAL | Status: AC
  Filled 2016-11-29: qty 2

## 2016-11-29 MED ORDER — SODIUM CHLORIDE 0.9% FLUSH
10.0000 mL | Freq: Once | INTRAVENOUS | Status: AC
  Administered 2016-11-29: 10 mL
  Filled 2016-11-29: qty 10

## 2016-11-29 MED ORDER — PROCHLORPERAZINE MALEATE 10 MG PO TABS
ORAL_TABLET | ORAL | Status: AC
  Filled 2016-11-29: qty 1

## 2016-11-29 MED FILL — LIDOCAINE-PRILOCAINE CREAM: 2.5-2.5 | 30 days supply | Qty: 30 | Fill #0 | Status: TO

## 2016-11-29 MED FILL — DEXAMETHASONE 4 MG TABLET: 4 | 28 days supply | Qty: 20 | Fill #0 | Status: TO

## 2016-11-29 NOTE — Patient Instructions (Signed)
Erik Nguyen Discharge Instructions for Patients Receiving Chemotherapy  Today you received the following chemotherapy agents Darzalex  To help prevent nausea and vomiting after your treatment, we encourage you to take your nausea medication as reveiwed by Dr. Alvy Bimler and your RN. If you develop nausea and vomiting that is not controlled by your nausea medication, call the clinic.   BELOW ARE SYMPTOMS THAT SHOULD BE REPORTED IMMEDIATELY:  *FEVER GREATER THAN 100.5 F  *CHILLS WITH OR WITHOUT FEVER  NAUSEA AND VOMITING THAT IS NOT CONTROLLED WITH YOUR NAUSEA MEDICATION  *UNUSUAL SHORTNESS OF BREATH  *UNUSUAL BRUISING OR BLEEDING  TENDERNESS IN MOUTH AND THROAT WITH OR WITHOUT PRESENCE OF ULCERS  *URINARY PROBLEMS  *BOWEL PROBLEMS  UNUSUAL RASH Items with * indicate a potential emergency and should be followed up as soon as possible.  Feel free to call the clinic you have any questions or concerns. The clinic phone number is [phone].  Please show the [place] at check-in to the Emergency Department and triage nurse.

## 2016-11-29 NOTE — Progress Notes (Signed)
Pt and VS stable at discharge, discharge instructions printed and verbally reviewed with patient and patient's spouse. Pt and patient's spouse verbalizes understanding.

## 2016-11-29 NOTE — Progress Notes (Signed)
1310 Patient reports stomach cramping and one episode of vomiting. Darzalex stopped, normal saline wide open. Dr. Alvy Bimler notified.   1312 Order given and carried out for zofran 8 mg IV push per Dr. Alvy Bimler. See Vital signs flowsheet.  1420: Darzalex restarted at 150 ml/hr per Dr. Alvy Bimler. Patient states that he is "feeling better". Patient has complaints of itching.  1500: Itching resolved. Patient without complaint.

## 2016-11-30 ENCOUNTER — Telehealth: Payer: Self-pay | Admitting: *Deleted

## 2016-11-30 MED FILL — LISINOPRIL 20 MG TAB: 20 | 30 days supply | Qty: 30 | Fill #5 | Status: TO

## 2016-11-30 MED FILL — ZOLPIDEM TARTRATE 10 MG TAB: 10 | 30 days supply | Qty: 30 | Fill #0 | Status: TO

## 2016-11-30 NOTE — Telephone Encounter (Signed)
Called patient for chemo follow up. States he did well last evening. Is eating and drinking without problems, denies N/V, bowels moving OK. No questions or concerns

## 2016-11-30 NOTE — Telephone Encounter (Signed)
-----   Message from Arna Snipe, RN sent at 11/29/2016  9:11 AM EDT ----- Regarding: Alvy Bimler - Chemo F/U Call Contact: [phone] 1st Daratumumab.

## 2016-12-06 ENCOUNTER — Ambulatory Visit: Payer: Medicaid Other

## 2016-12-06 ENCOUNTER — Encounter: Payer: Self-pay | Admitting: Hematology and Oncology

## 2016-12-06 ENCOUNTER — Other Ambulatory Visit: Payer: Self-pay | Admitting: Hematology and Oncology

## 2016-12-06 ENCOUNTER — Ambulatory Visit (HOSPITAL_BASED_OUTPATIENT_CLINIC_OR_DEPARTMENT_OTHER): Payer: Medicaid Other | Admitting: Hematology and Oncology

## 2016-12-06 ENCOUNTER — Other Ambulatory Visit (HOSPITAL_BASED_OUTPATIENT_CLINIC_OR_DEPARTMENT_OTHER): Payer: Medicaid Other

## 2016-12-06 ENCOUNTER — Ambulatory Visit (HOSPITAL_BASED_OUTPATIENT_CLINIC_OR_DEPARTMENT_OTHER): Payer: Medicaid Other

## 2016-12-06 VITALS — BP 163/111 | HR 69 | Temp 98.3°F | Resp 16

## 2016-12-06 DIAGNOSIS — C9001 Multiple myeloma in remission: Secondary | ICD-10-CM

## 2016-12-06 DIAGNOSIS — G893 Neoplasm related pain (acute) (chronic): Secondary | ICD-10-CM

## 2016-12-06 DIAGNOSIS — Z5112 Encounter for antineoplastic immunotherapy: Secondary | ICD-10-CM | POA: Diagnosis not present

## 2016-12-06 DIAGNOSIS — Z9481 Bone marrow transplant status: Secondary | ICD-10-CM

## 2016-12-06 LAB — CBC WITH DIFFERENTIAL/PLATELET
BASO%: 0.1 % (ref 0.0–2.0)
BASOS ABS: 0 10*3/uL (ref 0.0–0.1)
EOS%: 0 % (ref 0.0–7.0)
Eosinophils Absolute: 0 10*3/uL (ref 0.0–0.5)
HEMATOCRIT: 36.8 % — AB (ref 38.4–49.9)
HGB: 13.1 g/dL (ref 13.0–17.1)
LYMPH#: 0.6 10*3/uL — AB (ref 0.9–3.3)
LYMPH%: 5.8 % — AB (ref 14.0–49.0)
MCH: 32.7 pg (ref 27.2–33.4)
MCHC: 35.5 g/dL (ref 32.0–36.0)
MCV: 92.3 fL (ref 79.3–98.0)
MONO#: 0.6 10*3/uL (ref 0.1–0.9)
MONO%: 6.4 % (ref 0.0–14.0)
NEUT#: 8.4 10*3/uL — ABNORMAL HIGH (ref 1.5–6.5)
NEUT%: 87.7 % — AB (ref 39.0–75.0)
PLATELETS: 196 10*3/uL (ref 140–400)
RBC: 3.99 10*6/uL — ABNORMAL LOW (ref 4.20–5.82)
RDW: 15.4 % — ABNORMAL HIGH (ref 11.0–14.6)
WBC: 9.6 10*3/uL (ref 4.0–10.3)

## 2016-12-06 LAB — COMPREHENSIVE METABOLIC PANEL
ALT: 16 U/L (ref 0–55)
ANION GAP: 13 meq/L — AB (ref 3–11)
AST: 9 U/L (ref 5–34)
Albumin: 4 g/dL (ref 3.5–5.0)
Alkaline Phosphatase: 50 U/L (ref 40–150)
BUN: 14.7 mg/dL (ref 7.0–26.0)
CO2: 21 mEq/L — ABNORMAL LOW (ref 22–29)
Calcium: 9.4 mg/dL (ref 8.4–10.4)
Chloride: 108 mEq/L (ref 98–109)
Creatinine: 1.2 mg/dL (ref 0.7–1.3)
EGFR: 78 mL/min/{1.73_m2} — AB (ref >90)
Glucose: 215 mg/dl — ABNORMAL HIGH (ref 70–140)
POTASSIUM: 3.9 meq/L (ref 3.5–5.1)
Sodium: 142 mEq/L (ref 136–145)
Total Bilirubin: 0.36 mg/dL (ref 0.20–1.20)
Total Protein: 7.1 g/dL (ref 6.4–8.3)

## 2016-12-06 MED ORDER — SODIUM CHLORIDE 0.9% FLUSH
10.0000 mL | INTRAVENOUS | Status: DC | PRN
  Administered 2016-12-06: 10 mL
  Filled 2016-12-06: qty 10

## 2016-12-06 MED ORDER — SODIUM CHLORIDE 0.9 % IV SOLN
15.8000 mg/kg | Freq: Once | INTRAVENOUS | Status: AC
  Administered 2016-12-06: 1200 mg via INTRAVENOUS
  Filled 2016-12-06: qty 60

## 2016-12-06 MED ORDER — FAMOTIDINE IN NACL 20-0.9 MG/50ML-% IV SOLN
INTRAVENOUS | Status: AC
  Filled 2016-12-06: qty 50

## 2016-12-06 MED ORDER — SODIUM CHLORIDE 0.9 % IV SOLN
20.0000 mg | Freq: Once | INTRAVENOUS | Status: AC
  Administered 2016-12-06: 20 mg via INTRAVENOUS
  Filled 2016-12-06: qty 2

## 2016-12-06 MED ORDER — FAMOTIDINE IN NACL 20-0.9 MG/50ML-% IV SOLN
20.0000 mg | Freq: Two times a day (BID) | INTRAVENOUS | Status: DC
  Administered 2016-12-06: 20 mg via INTRAVENOUS

## 2016-12-06 MED ORDER — HEPARIN SOD (PORK) LOCK FLUSH 100 UNIT/ML IV SOLN
500.0000 [IU] | Freq: Once | INTRAVENOUS | Status: AC | PRN
  Administered 2016-12-06: 500 [IU]
  Filled 2016-12-06: qty 5

## 2016-12-06 MED ORDER — SODIUM CHLORIDE 0.9 % IV SOLN
Freq: Once | INTRAVENOUS | Status: AC
  Administered 2016-12-06: 09:00:00 via INTRAVENOUS

## 2016-12-06 MED ORDER — ACETAMINOPHEN 325 MG PO TABS
ORAL_TABLET | ORAL | Status: AC
  Filled 2016-12-06: qty 2

## 2016-12-06 MED ORDER — PROCHLORPERAZINE MALEATE 10 MG PO TABS
10.0000 mg | ORAL_TABLET | Freq: Once | ORAL | Status: AC
  Administered 2016-12-06: 10 mg via ORAL

## 2016-12-06 MED ORDER — PROCHLORPERAZINE MALEATE 10 MG PO TABS
ORAL_TABLET | ORAL | Status: AC
  Filled 2016-12-06: qty 1

## 2016-12-06 MED ORDER — ACETAMINOPHEN 325 MG PO TABS
650.0000 mg | ORAL_TABLET | Freq: Once | ORAL | Status: AC
  Administered 2016-12-06: 650 mg via ORAL

## 2016-12-06 MED ORDER — SODIUM CHLORIDE 0.9% FLUSH
10.0000 mL | Freq: Once | INTRAVENOUS | Status: AC
  Administered 2016-12-06: 10 mL
  Filled 2016-12-06: qty 10

## 2016-12-06 NOTE — Assessment & Plan Note (Signed)
He tolerated chemotherapy well without major side effects We will continue Daratumumab weekly for the first [redacted] weeks along with Pomalyst, days 1-21, rest 7 days, along with dexamethasone He will continue antimicrobial prophylaxis He will take aspirin for DVT prophylaxis He is on calcium and vitamin D Dental visit is scheduled for tomorrow and I would defer Zometa to next week

## 2016-12-06 NOTE — Assessment & Plan Note (Signed)
He will continue antimicrobial prophylaxis as directed by his physicians at [facility]. We will continue to provide supportive care. He will receive his transplant vaccination at [facility] in June

## 2016-12-06 NOTE — Progress Notes (Signed)
1225 - Okay to proceed with darzalex rate increase despite elevated BP per Dr. Alvy Bimler. Unless pt becomes symptomatic, continue with infusion. Pt verbalized understanding to recheck BP before bed and to notify cancer center if symptoms occur.

## 2016-12-06 NOTE — Patient Instructions (Signed)
[place] Discharge Instructions for Patients Receiving Chemotherapy  Today you received the following chemotherapy agents Darzalex  To help prevent nausea and vomiting after your treatment, we encourage you to take your nausea medication as reveiwed by your doctor. If you develop nausea and vomiting that is not controlled by your nausea medication, call the clinic.   BELOW ARE SYMPTOMS THAT SHOULD BE REPORTED IMMEDIATELY:  *FEVER GREATER THAN 100.5 F  *CHILLS WITH OR WITHOUT FEVER  NAUSEA AND VOMITING THAT IS NOT CONTROLLED WITH YOUR NAUSEA MEDICATION  *UNUSUAL SHORTNESS OF BREATH  *UNUSUAL BRUISING OR BLEEDING  TENDERNESS IN MOUTH AND THROAT WITH OR WITHOUT PRESENCE OF ULCERS  *URINARY PROBLEMS  *BOWEL PROBLEMS  UNUSUAL RASH Items with * indicate a potential emergency and should be followed up as soon as possible.  Feel free to call the clinic you have any questions or concerns. The clinic phone number is [phone].  Please show the [place] at check-in to the Emergency Department and triage nurse.

## 2016-12-06 NOTE — Progress Notes (Signed)
[place] OFFICE PROGRESS NOTE  Patient Care Team: Arnoldo Morale, MD as PCP - General (Family Medicine) Reola Calkins, MD as Consulting Physician (Hematology and Oncology)  SUMMARY OF ONCOLOGIC HISTORY:   Multiple myeloma in remission [facility])   08/28/2015 Bone Marrow Biopsy    Accession: [id number] biopsy showed 58% myeloma involvement. Cytogenetics was normal and FISH showed Del 13q and t(11;14).       09/07/2015 - 01/22/2016 Chemotherapy    he received 6 cycles of treatment of Velcade and Revlimid with Dex       09/12/2015 - 09/16/2015 Hospital Admission    He was admitted to the hospital with sepsis, treatment was placed on hold and he received one dose of GCSF for leukopenia      01/12/2016 Adverse Reaction    Delay treatment due to neutropenia      01/27/2016 Imaging    ECHO at [facility] showed normal EF      01/27/2016 PET scan    PET scan at [facility] showed innumerable lytic lesions throughout      01/27/2016 Procedure    PFT is within normal limits at [facility]      01/28/2016 Bone Marrow Biopsy    Bone marrow at [facility] showed persistent plasma cell myeloma in a normocellularmarrow (30%) with 20% atypical plasma cells and myeloid hyperplasia.      02/11/2016 - 06/10/2016 Chemotherapy    He is started on salvage Rx with Kyprolis, Cytoxan and dexamethasone      03/24/2016 Imaging    US venous Doppler showed no evidence of deep vein or superficial thrombosis involving the right lower extremity and left lower extremity. No evidence of Baker&'s cyst on the right or left.      05/18/2016 - 05/19/2016 Hospital Admission    He was admitted for chest pain evaluation. Cardiac work-up excluded cardiac events and CT angiogram were negative for PE. It showed esphageal thickening c/w reflux esophagitis likely cause for his chest discomfort      05/23/2016 - 05/25/2016 Hospital Admission    Patient was hospitalized for worsening shortness of breath and  worsening pain. CT angiogram chest negative for pulmonary emboli. Patient had a recent normal 2-D echo and ruled out for MI. Patient noted to be dehydrated. Patient admitted placed on IV fluids. Diuretic discontinued. Patient's gabapentin has been increased per recommendations from prior office visit from patient's oncologist to 600 mg 3 times daily      06/15/2016 Bone Marrow Biopsy    Bone marrow biopsy showed only 2% plasma cell, normal FISH and cytogenetics      07/26/2016 - 07/26/2016 Chemotherapy    He received high dose melphalan at [facility]       07/27/2016 Bone Marrow Transplant    He received autologous stem cell transplant at [facility]      08/05/2016 - 08/10/2016 Hospital Admission    He was admitted to [facility] for management of neutropenic fever and diarrhea. Cultures were negative. Stool culture came back positive for Novovirus and he was managed conservatively.      11/03/2016 Bone Marrow Biopsy    Bone Marrow Flow Cytometry - NO MONOCLONAL B CELL POPULATION IDENTIFIED. - PREDOMINANCE OF T CELLS WITH NON SPECIFIC CHANGES. - SEE NOTE. Diagnosis Comment: Analysis of the lymphoid population shows predominance of T lymphocytes expressing pan T-cell antigens but with relative abundance of CD8 positive cells and reversal of the CD4:CD8 ratio. No significant CD16/56 expression is identified. The T-cell changes  are not considered specific in this setting. B cells represent the minor population with no monoclonality or abnormal phenotype. 4% lambda light chain restricted plasma cells. FISH positive CCND1/IGH 12% of cells.       11/09/2016 PET scan    No FDG avid myeloma is identified. Numerous osseous lytic lesions as above. 2.Hypermetabolic left level III lymph node, likely reactive etiology.  3.Ancillary CT findings as above.      11/29/2016 -  Chemotherapy    He received treatment with daratumumab and Pomalyst        INTERVAL HISTORY: Please see below for problem  oriented charting. He is seen in the infusion room prior to cycle 2 of treatment He feels well Denies recent infection No worsening bone pain Appetite is stable, no recent weight loss He tolerated chemotherapy well with no side effects so far  REVIEW OF SYSTEMS:   Constitutional: Denies fevers, chills or abnormal weight loss Eyes: Denies blurriness of vision Ears, nose, mouth, throat, and face: Denies mucositis or sore throat Respiratory: Denies cough, dyspnea or wheezes Cardiovascular: Denies palpitation, chest discomfort or lower extremity swelling Gastrointestinal:  Denies nausea, heartburn or change in bowel habits Skin: Denies abnormal skin rashes Lymphatics: Denies new lymphadenopathy or easy bruising Neurological:Denies numbness, tingling or new weaknesses Behavioral/Psych: Mood is stable, no new changes  All other systems were reviewed with the patient and are negative.  I have reviewed the past medical history, past surgical history, social history and family history with the patient and they are unchanged from previous note.  ALLERGIES:  is allergic to benadryl [diphenhydramine]; trazodone and nefazodone; and latex.  MEDICATIONS:  Current Outpatient Prescriptions  Medication Sig Dispense Refill  . acyclovir (ZOVIRAX) 800 MG tablet Take 800 mg by mouth 2 (two) times daily.    Marland Kitchen aluminum-magnesium hydroxide-simethicone (MAALOX) 709-628-36 MG/5ML SUSP Take 30 mLs by mouth every 4 (four) hours as needed.    Marland Kitchen CALCIUM PO Take 1 tablet by mouth daily.    . cholecalciferol (VITAMIN D) 1000 units tablet Take 1,000 Units by mouth daily.     Marland Kitchen dexamethasone (DECADRON) 4 MG tablet Take 5 tablets (20 mg total) by mouth once a week. 60 tablet 9  . folic acid (FOLVITE) 1 MG tablet Take 1 mg by mouth daily.  1  . gabapentin (NEURONTIN) 300 MG capsule Take 2 capsules (600 mg total) by mouth 2 (two) times daily. 90 capsule 9  . lidocaine-prilocaine (EMLA) cream Apply to affected area once  30 g 3  . lidocaine-prilocaine (EMLA) cream Apply 1 application topically as needed. 30 g 6  . lisinopril (PRINIVIL,ZESTRIL) 20 MG tablet Take 1 tablet (20 mg total) by mouth daily. 30 tablet 6  . loperamide (IMODIUM) 2 MG capsule Take 2 mg by mouth 4 (four) times daily as needed.    . Multiple Vitamin (MULTIVITAMIN) tablet Take 1 tablet by mouth daily.  1  . nystatin (MYCOSTATIN) 100000 UNIT/ML suspension Take 5 mLs by mouth 4 (four) times daily as needed.    . ondansetron (ZOFRAN) 8 MG tablet Take 8 mg by mouth every 8 (eight) hours as needed.    . pantoprazole (PROTONIX) 40 MG tablet Take 1 tablet (40 mg total) by mouth daily. 30 tablet 6  . pomalidomide (POMALYST) 3 MG capsule Take 1 capsule (3 mg total) by mouth daily. Take with water on days 1-21. Repeat every 28 days. (Patient not taking: Reported on 11/21/2016) 21 capsule 11  . prochlorperazine (COMPAZINE) 10 MG tablet Take 10  mg by mouth every 6 (six) hours as needed.    . ranitidine (ZANTAC) 150 MG tablet Take 1 tablet (150 mg total) by mouth at bedtime. 30 tablet 3  . sulfamethoxazole-trimethoprim (BACTRIM DS,SEPTRA DS) 800-160 MG tablet Take 1 tablet by mouth 3 (three) times a week.  1  . traMADol (ULTRAM) 50 MG tablet TAKE 1 TO 2 TABLETS BY MOUTH EVERY 6 HOURS AS NEEDED FOR PAIN 90 tablet 0  . zolpidem (AMBIEN) 10 MG tablet Take 1 tablet (10 mg total) by mouth at bedtime as needed for sleep. 30 tablet 3   No current facility-administered medications for this visit.    Facility-Administered Medications Ordered in Other Visits  Medication Dose Route Frequency Provider Last Rate Last Dose  . daratumumab (DARZALEX) 1,200 mg in sodium chloride 0.9 % 440 mL (2.4 mg/mL) chemo infusion  15.8 mg/kg (Treatment Plan Recorded) Intravenous Once Heath Lark, MD      . famotidine (PEPCID) IVPB 20 mg premix  20 mg Intravenous Q12H Javed Cotto, MD   20 mg at 12/06/16 0938  . heparin lock flush 100 unit/mL  500 Units Intracatheter Once PRN Heath Lark, MD       . sodium chloride flush (NS) 0.9 % injection 10 mL  10 mL Intracatheter PRN Heath Lark, MD        PHYSICAL EXAMINATION: ECOG PERFORMANCE STATUS: 0 - Asymptomatic  Vitals:   12/06/16 0922  BP: (!) 139/91  Pulse: 100  Resp: 16  Temp: 98.7 F (37.1 C)   There were no vitals filed for this visit.  GENERAL:alert, no distress and comfortable SKIN: skin color, texture, turgor are normal, no rashes or significant lesions EYES: normal, Conjunctiva are pink and non-injected, sclera clear OROPHARYNX:no exudate, no erythema and lips, buccal mucosa, and tongue normal  NECK: supple, thyroid normal size, non-tender, without nodularity LYMPH:  no palpable lymphadenopathy in the cervical, axillary or inguinal LUNGS: clear to auscultation and percussion with normal breathing effort HEART: regular rate & rhythm and no murmurs and no lower extremity edema ABDOMEN:abdomen soft, non-tender and normal bowel sounds Musculoskeletal:no cyanosis of digits and no clubbing  NEURO: alert & oriented x 3 with fluent speech, no focal motor/sensory deficits  LABORATORY DATA:  I have reviewed the data as listed    Component Value Date/Time   NA 142 12/06/2016 0831   K 3.9 12/06/2016 0831   CL 109 06/15/2016 0720   CO2 21 (L) 12/06/2016 0831   GLUCOSE 215 (H) 12/06/2016 0831   BUN 14.7 12/06/2016 0831   CREATININE 1.2 12/06/2016 0831   CALCIUM 9.4 12/06/2016 0831   PROT 7.1 12/06/2016 0831   ALBUMIN 4.0 12/06/2016 0831   AST 9 12/06/2016 0831   ALT 16 12/06/2016 0831   ALKPHOS 50 12/06/2016 0831   BILITOT 0.36 12/06/2016 0831   GFRNONAA >60 06/15/2016 0720   GFRNONAA 71 07/24/2015 1155   GFRAA >60 06/15/2016 0720   GFRAA 82 07/24/2015 1155    No results found for: SPEP, UPEP  Lab Results  Component Value Date   WBC 9.6 12/06/2016   NEUTROABS 8.4 (H) 12/06/2016   HGB 13.1 12/06/2016   HCT 36.8 (L) 12/06/2016   MCV 92.3 12/06/2016   PLT 196 12/06/2016      Chemistry      Component  Value Date/Time   NA 142 12/06/2016 0831   K 3.9 12/06/2016 0831   CL 109 06/15/2016 0720   CO2 21 (L) 12/06/2016 0831   BUN 14.7 12/06/2016 0831  CREATININE 1.2 12/06/2016 0831      Component Value Date/Time   CALCIUM 9.4 12/06/2016 0831   ALKPHOS 50 12/06/2016 0831   AST 9 12/06/2016 0831   ALT 16 12/06/2016 0831   BILITOT 0.36 12/06/2016 0831       ASSESSMENT & PLAN:  Multiple myeloma in remission ([place]) He tolerated chemotherapy well without major side effects We will continue Daratumumab weekly for the first [redacted] weeks along with Pomalyst, days 1-21, rest 7 days, along with dexamethasone He will continue antimicrobial prophylaxis He will take aspirin for DVT prophylaxis He is on calcium and vitamin D Dental visit is scheduled for tomorrow and I would defer Zometa to next week  S/P autologous bone marrow transplantation [facility]) He will continue antimicrobial prophylaxis as directed by his physicians at [facility]. We will continue to provide supportive care. He will receive his transplant vaccination at [facility] in June  Cancer associated pain His prior peripheral neuropathy and chronic pain has drastically improved since bone marrow transplant He is currently taking tramadol as needed. We'll continue the same He is on calcium and vitamin D supplement   No orders of the defined types were placed in this encounter.  All questions were answered. The patient knows to call the clinic with any problems, questions or concerns. No barriers to learning was detected. I spent 15 minutes counseling the patient face to face. The total time spent in the appointment was 20 minutes and more than 50% was on counseling and review of test results     Heath Lark, MD 12/06/2016 10:38 AM

## 2016-12-06 NOTE — Assessment & Plan Note (Signed)
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 We'll continue the same He is on calcium and vitamin D supplement 

## 2016-12-08 ENCOUNTER — Telehealth: Payer: Self-pay

## 2016-12-08 ENCOUNTER — Other Ambulatory Visit: Payer: Self-pay | Admitting: Hematology and Oncology

## 2016-12-08 DIAGNOSIS — J3089 Other allergic rhinitis: Secondary | ICD-10-CM

## 2016-12-08 MED ORDER — TRIAMCINOLONE ACETONIDE 55 MCG/ACT NA AERO: 1.0000 | INHALATION_SPRAY | Freq: Two times a day (BID) | NASAL | 12 refills | Status: DC

## 2016-12-08 NOTE — Telephone Encounter (Signed)
He is already on prophylaxis Rx, less likely to be infective, more likely allergy related I will add nasacort (I will e-scribe) and I recommend she add Mucinex (OTC) plus claritin OTC If not better, call back Monday morning

## 2016-12-08 NOTE — Telephone Encounter (Signed)
Called wife with below message, verbalized understanding.

## 2016-12-08 NOTE — Telephone Encounter (Signed)
Wife called about her husband. Patient is having cold symptoms, with a scratchy throat that started yesterday. No fever, blood pressure is 165/ 90's, with a productive thick white cough a times. He has taken Alka-seltzer plus and Hall's cough drops.

## 2016-12-09 ENCOUNTER — Inpatient Hospital Stay
Admission: EM | Admit: 2016-12-09 | Discharge: 2016-12-10 | DRG: 872 | Disposition: A | Discharge status: Home / Self Care | Payer: Medicaid Other | Attending: Internal Medicine | Admitting: Internal Medicine

## 2016-12-09 ENCOUNTER — Emergency Department: Payer: Medicaid Other

## 2016-12-09 ENCOUNTER — Encounter: Payer: Self-pay | Admitting: Emergency Medicine

## 2016-12-09 DIAGNOSIS — R05 Cough: Secondary | ICD-10-CM | POA: Diagnosis not present

## 2016-12-09 DIAGNOSIS — R079 Chest pain, unspecified: Secondary | ICD-10-CM | POA: Diagnosis present

## 2016-12-09 DIAGNOSIS — Z7952 Long term (current) use of systemic steroids: Secondary | ICD-10-CM | POA: Diagnosis not present

## 2016-12-09 DIAGNOSIS — Z9221 Personal history of antineoplastic chemotherapy: Secondary | ICD-10-CM | POA: Diagnosis not present

## 2016-12-09 DIAGNOSIS — R000 Tachycardia, unspecified: Secondary | ICD-10-CM | POA: Diagnosis present

## 2016-12-09 DIAGNOSIS — Z792 Long term (current) use of antibiotics: Secondary | ICD-10-CM

## 2016-12-09 DIAGNOSIS — A419 Sepsis, unspecified organism: Principal | ICD-10-CM | POA: Diagnosis present

## 2016-12-09 DIAGNOSIS — Z888 Allergy status to other drugs, medicaments and biological substances status: Secondary | ICD-10-CM | POA: Diagnosis not present

## 2016-12-09 DIAGNOSIS — R509 Fever, unspecified: Secondary | ICD-10-CM

## 2016-12-09 DIAGNOSIS — Z87891 Personal history of nicotine dependence: Secondary | ICD-10-CM | POA: Diagnosis not present

## 2016-12-09 DIAGNOSIS — C9000 Multiple myeloma not having achieved remission: Secondary | ICD-10-CM | POA: Diagnosis present

## 2016-12-09 DIAGNOSIS — Z66 Do not resuscitate: Secondary | ICD-10-CM | POA: Diagnosis present

## 2016-12-09 DIAGNOSIS — G629 Polyneuropathy, unspecified: Secondary | ICD-10-CM | POA: Diagnosis present

## 2016-12-09 DIAGNOSIS — I10 Essential (primary) hypertension: Secondary | ICD-10-CM | POA: Diagnosis present

## 2016-12-09 DIAGNOSIS — Z9484 Stem cells transplant status: Secondary | ICD-10-CM | POA: Diagnosis not present

## 2016-12-09 DIAGNOSIS — D849 Immunodeficiency, unspecified: Secondary | ICD-10-CM

## 2016-12-09 DIAGNOSIS — Z8042 Family history of malignant neoplasm of prostate: Secondary | ICD-10-CM | POA: Diagnosis not present

## 2016-12-09 DIAGNOSIS — Z79899 Other long term (current) drug therapy: Secondary | ICD-10-CM

## 2016-12-09 DIAGNOSIS — Z8619 Personal history of other infectious and parasitic diseases: Secondary | ICD-10-CM | POA: Diagnosis not present

## 2016-12-09 DIAGNOSIS — D899 Disorder involving the immune mechanism, unspecified: Secondary | ICD-10-CM | POA: Diagnosis present

## 2016-12-09 DIAGNOSIS — J209 Acute bronchitis, unspecified: Secondary | ICD-10-CM | POA: Diagnosis present

## 2016-12-09 DIAGNOSIS — Z91040 Latex allergy status: Secondary | ICD-10-CM | POA: Diagnosis not present

## 2016-12-09 LAB — LACTIC ACID, PLASMA
Lactic Acid, Venous: 1 mmol/L (ref 0.5–1.9)
Lactic Acid, Venous: 1.7 mmol/L (ref 0.5–1.9)

## 2016-12-09 LAB — COMPREHENSIVE METABOLIC PANEL
ALK PHOS: 44 U/L (ref 38–126)
ALT: 26 U/L (ref 17–63)
AST: 20 U/L (ref 15–41)
Albumin: 3.6 g/dL (ref 3.5–5.0)
Anion gap: 7 (ref 5–15)
BUN: 11 mg/dL (ref 6–20)
CHLORIDE: 105 mmol/L (ref 101–111)
CO2: 25 mmol/L (ref 22–32)
CREATININE: 1.04 mg/dL (ref 0.61–1.24)
Calcium: 8.9 mg/dL (ref 8.9–10.3)
GFR calc non Af Amer: >60 mL/min (ref >60)
GLUCOSE: 103 mg/dL — AB (ref 65–99)
Potassium: 4.1 mmol/L (ref 3.5–5.1)
SODIUM: 137 mmol/L (ref 135–145)
Total Bilirubin: 0.5 mg/dL (ref 0.3–1.2)
Total Protein: 6.2 g/dL — ABNORMAL LOW (ref 6.5–8.1)

## 2016-12-09 LAB — CBC WITH DIFFERENTIAL/PLATELET
BASOS ABS: 0 10*3/uL (ref 0–0.1)
Basophils Relative: 0 %
EOS ABS: 0.1 10*3/uL (ref 0–0.7)
EOS PCT: 2 %
HCT: 38.3 % — ABNORMAL LOW (ref 40.0–52.0)
HEMOGLOBIN: 13.3 g/dL (ref 13.0–18.0)
LYMPHS ABS: 0.6 10*3/uL — AB (ref 1.0–3.6)
Lymphocytes Relative: 9 %
MCH: 31.7 pg (ref 26.0–34.0)
MCHC: 34.7 g/dL (ref 32.0–36.0)
MCV: 91.2 fL (ref 80.0–100.0)
Monocytes Absolute: 0.8 10*3/uL (ref 0.2–1.0)
Monocytes Relative: 11 %
NEUTROS PCT: 78 %
Neutro Abs: 5.4 10*3/uL (ref 1.4–6.5)
PLATELETS: 144 10*3/uL — AB (ref 150–440)
RBC: 4.2 MIL/uL — AB (ref 4.40–5.90)
RDW: 14.9 % — ABNORMAL HIGH (ref 11.5–14.5)
WBC: 7 10*3/uL (ref 3.8–10.6)

## 2016-12-09 LAB — URINALYSIS, COMPLETE (UACMP) WITH MICROSCOPIC
BACTERIA UA: NONE SEEN
Bilirubin Urine: NEGATIVE
GLUCOSE, UA: NEGATIVE mg/dL
Hgb urine dipstick: NEGATIVE
KETONES UR: NEGATIVE mg/dL
LEUKOCYTES UA: NEGATIVE
Nitrite: NEGATIVE
PROTEIN: NEGATIVE mg/dL
Specific Gravity, Urine: 1.016 (ref 1.005–1.030)
pH: 7 (ref 5.0–8.0)

## 2016-12-09 LAB — MRSA PCR SCREENING: MRSA BY PCR: NEGATIVE

## 2016-12-09 LAB — INFLUENZA PANEL BY PCR (TYPE A & B)
Influenza A By PCR: NEGATIVE
Influenza B By PCR: NEGATIVE

## 2016-12-09 MED ORDER — SENNOSIDES-DOCUSATE SODIUM 8.6-50 MG PO TABS: 1.0000 | ORAL_TABLET | Freq: Every evening | ORAL | Status: DC | PRN

## 2016-12-09 MED ORDER — CALCIUM CARBONATE ANTACID 500 MG PO CHEW
500.0000 mg | CHEWABLE_TABLET | Freq: Every day | ORAL | Status: DC
  Administered 2016-12-10: 500 mg via ORAL
  Filled 2016-12-09: qty 1

## 2016-12-09 MED ORDER — FAMOTIDINE 20 MG PO TABS
10.0000 mg | ORAL_TABLET | Freq: Every day | ORAL | Status: DC
  Administered 2016-12-09 – 2016-12-10 (×2): 10 mg via ORAL
  Filled 2016-12-09 (×2): qty 1

## 2016-12-09 MED ORDER — GABAPENTIN 300 MG PO CAPS
600.0000 mg | ORAL_CAPSULE | Freq: Two times a day (BID) | ORAL | Status: DC
  Administered 2016-12-09 – 2016-12-10 (×2): 600 mg via ORAL
  Filled 2016-12-09 (×2): qty 2

## 2016-12-09 MED ORDER — DEXAMETHASONE 4 MG PO TABS: 20.0000 mg | ORAL_TABLET | ORAL | Status: DC

## 2016-12-09 MED ORDER — ENOXAPARIN SODIUM 40 MG/0.4ML ~~LOC~~ SOLN: 40.0000 mg | SUBCUTANEOUS | Status: DC

## 2016-12-09 MED ORDER — PIPERACILLIN-TAZOBACTAM 3.375 G IVPB 30 MIN
3.3750 g | Freq: Once | INTRAVENOUS | Status: AC
  Administered 2016-12-09: 3.375 g via INTRAVENOUS
  Filled 2016-12-09: qty 50

## 2016-12-09 MED ORDER — ACETAMINOPHEN 650 MG RE SUPP: 650.0000 mg | Freq: Four times a day (QID) | RECTAL | Status: DC | PRN

## 2016-12-09 MED ORDER — SODIUM CHLORIDE 0.9 % IV BOLUS (SEPSIS)
1000.0000 mL | Freq: Once | INTRAVENOUS | Status: AC
  Administered 2016-12-09: 1000 mL via INTRAVENOUS

## 2016-12-09 MED ORDER — TRAMADOL HCL 50 MG PO TABS: 50.0000 mg | ORAL_TABLET | Freq: Four times a day (QID) | ORAL | Status: DC | PRN

## 2016-12-09 MED ORDER — ONDANSETRON HCL 4 MG PO TABS: 4.0000 mg | ORAL_TABLET | Freq: Four times a day (QID) | ORAL | Status: DC | PRN

## 2016-12-09 MED ORDER — SODIUM CHLORIDE 0.9 % IV BOLUS (SEPSIS)
1000.0000 mL | Freq: Once | INTRAVENOUS | Status: AC
Start: 2016-12-09 — End: 2016-12-09
  Administered 2016-12-09: 1000 mL via INTRAVENOUS

## 2016-12-09 MED ORDER — HYDROCODONE-ACETAMINOPHEN 5-325 MG PO TABS: 1.0000 | ORAL_TABLET | ORAL | Status: DC | PRN

## 2016-12-09 MED ORDER — SODIUM CHLORIDE 0.9 % IV SOLN
INTRAVENOUS | Status: DC
  Administered 2016-12-09: 20:00:00 via INTRAVENOUS

## 2016-12-09 MED ORDER — DEXTROSE 5 % IV SOLN
2.0000 g | Freq: Three times a day (TID) | INTRAVENOUS | Status: DC
  Administered 2016-12-09 – 2016-12-10 (×2): 2 g via INTRAVENOUS
  Filled 2016-12-09 (×5): qty 2

## 2016-12-09 MED ORDER — FOLIC ACID 1 MG PO TABS
1.0000 mg | ORAL_TABLET | Freq: Every day | ORAL | Status: DC
Start: 2016-12-10 — End: 2016-12-10
  Administered 2016-12-10: 07:00:00 1 mg via ORAL
  Filled 2016-12-09: qty 1

## 2016-12-09 MED ORDER — ONDANSETRON HCL 4 MG/2ML IJ SOLN: 4.0000 mg | Freq: Four times a day (QID) | INTRAMUSCULAR | Status: DC | PRN

## 2016-12-09 MED ORDER — PANTOPRAZOLE SODIUM 40 MG PO TBEC
40.0000 mg | DELAYED_RELEASE_TABLET | Freq: Every day | ORAL | Status: DC
  Administered 2016-12-10: 07:00:00 40 mg via ORAL
  Filled 2016-12-09 (×2): qty 1

## 2016-12-09 MED ORDER — ACYCLOVIR 200 MG PO CAPS
800.0000 mg | ORAL_CAPSULE | Freq: Two times a day (BID) | ORAL | Status: DC
  Administered 2016-12-09 – 2016-12-10 (×2): 800 mg via ORAL
  Filled 2016-12-09 (×3): qty 4

## 2016-12-09 MED ORDER — SULFAMETHOXAZOLE-TRIMETHOPRIM 800-160 MG PO TABS: 1.0000 | ORAL_TABLET | ORAL | Status: DC

## 2016-12-09 MED ORDER — VANCOMYCIN HCL IN DEXTROSE 1-5 GM/200ML-% IV SOLN
1000.0000 mg | Freq: Two times a day (BID) | INTRAVENOUS | Status: DC
  Administered 2016-12-10: 1000 mg via INTRAVENOUS
  Filled 2016-12-09 (×3): qty 200

## 2016-12-09 MED ORDER — LISINOPRIL 20 MG PO TABS
20.0000 mg | ORAL_TABLET | Freq: Every day | ORAL | Status: DC
  Administered 2016-12-10: 20 mg via ORAL
  Filled 2016-12-09: qty 1

## 2016-12-09 MED ORDER — ZOLPIDEM TARTRATE 5 MG PO TABS
10.0000 mg | ORAL_TABLET | Freq: Every evening | ORAL | Status: DC | PRN
  Administered 2016-12-09: 22:00:00 10 mg via ORAL
  Filled 2016-12-09: qty 2

## 2016-12-09 MED ORDER — VANCOMYCIN HCL IN DEXTROSE 1-5 GM/200ML-% IV SOLN
1000.0000 mg | Freq: Once | INTRAVENOUS | Status: AC
  Administered 2016-12-09: 1000 mg via INTRAVENOUS
  Filled 2016-12-09: qty 200

## 2016-12-09 MED ORDER — BISACODYL 5 MG PO TBEC
5.0000 mg | DELAYED_RELEASE_TABLET | Freq: Every day | ORAL | Status: DC | PRN
  Administered 2016-12-10: 5 mg via ORAL
  Filled 2016-12-09 (×2): qty 1

## 2016-12-09 MED ORDER — ACETAMINOPHEN 325 MG PO TABS
650.0000 mg | ORAL_TABLET | Freq: Four times a day (QID) | ORAL | Status: DC | PRN
  Administered 2016-12-09: 650 mg via ORAL
  Filled 2016-12-09: qty 2

## 2016-12-09 MED ORDER — VITAMIN D 1000 UNITS PO TABS
1000.0000 [IU] | ORAL_TABLET | Freq: Every day | ORAL | Status: DC
  Administered 2016-12-10: 1000 [IU] via ORAL
  Filled 2016-12-09: qty 1

## 2016-12-09 MED ORDER — ADULT MULTIVITAMIN W/MINERALS CH
1.0000 | ORAL_TABLET | Freq: Every day | ORAL | Status: DC
Start: 2016-12-10 — End: 2016-12-10
  Administered 2016-12-10: 07:00:00 1 via ORAL
  Filled 2016-12-09: qty 1

## 2016-12-09 NOTE — ED Provider Notes (Signed)
[facility] Emergency Department Provider Note  ____________________________________________  Time seen: Approximately 9:20 PM  I have reviewed the triage vital signs and the nursing notes.   HISTORY  Chief Complaint Fever and Cough   HPI Erik Nguyen is a 61 y.o. male with a history of multiple myeloma currently on chemotherapy who presents for evaluation of fever.Patient reports 3 days of productive cough and generalized malaise. Today he had a fever 100.30F which prompted his visit to the emergency room. Last chemotherapy was 3 days ago. No sore throat, no nausea, no vomiting, no diarrhea, no rash. Patient has had shortness of breath and also complaints of chest pain that is sharp and located in the left side of his chest wall present only when he coughs. No chest pain when he is not coughing.  Past Medical History:  Diagnosis Date  . Encounter for antineoplastic chemotherapy 02/09/2016  . History of syphilis 02/24/2016  . Hypertension Dx 2016  . Multiple myeloma not having achieved remission ([place]) 09/01/2015  . Priapism 2014     Patient Active Problem List   Diagnosis Date Noted  . Sepsis ([place]) 12/09/2016  . Goals of care, counseling/discussion 11/18/2016  . S/P autologous bone marrow transplantation ([place]) 08/12/2016  . Mild protein-calorie malnutrition ([place]) 08/12/2016  . Dysphagia 08/12/2016  . Essential hypertension 05/26/2016  . Hypoxia 05/24/2016  . Dehydration 05/23/2016  . Chest pain 05/18/2016  . GERD (gastroesophageal reflux disease) 05/18/2016  . Blepharoconjunctivitis of both eyes 04/21/2016  . Dyspnea 03/25/2016  . Left leg pain 03/24/2016  . Port catheter in place 02/25/2016  . Pancytopenia due to antineoplastic chemotherapy ([place]) 02/25/2016  . History of syphilis 02/24/2016  . Encounter for antineoplastic chemotherapy 02/09/2016  . Skin rash 12/14/2015  . 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  Antineoplastic chemotherapy induced pancytopenia ([place])   . Bone pain   . Cancer associated pain 09/02/2015  . Multiple myeloma in remission ([place]) 09/01/2015  . 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 Vitamin D deficiency 07/22/2015  . Closed right clavicular fracture 07/17/2015  . Chest wall pain 03/12/2015  . Thoracolumbar back pain 03/12/2015  . Allergic rhinitis 03/12/2015  . Poor dentition 01/15/2015  . Nerve damage 01/15/2015  . Esophagitis 12/28/2014  . Lower GI bleed   . Hemorrhoids 11/06/2014  . HTN (hypertension) 11/06/2014  . Priapism 11/06/2014  . Decreased visual acuity 11/06/2014  . Family history of prostate cancer in father 11/06/2014    Past Surgical History:  Procedure Laterality Date  . COLONOSCOPY    . POLYPECTOMY      Prior to Admission medications   Medication Sig Start Date End Date Taking? Authorizing Provider  acyclovir (ZOVIRAX) 800 MG tablet Take 800 mg by mouth 2 (two) times daily. 08/01/16  Yes Historical Provider, MD  aluminum-magnesium hydroxide-simethicone (MAALOX) 128-786-76 MG/5ML SUSP Take 30 mLs by mouth every 4 (four) hours as needed.   Yes Historical Provider, MD  CALCIUM PO Take 1 tablet by mouth daily.   Yes Historical Provider, MD  cholecalciferol (VITAMIN D) 1000 units tablet Take 1,000 Units by mouth daily.    Yes Historical Provider, MD  dexamethasone (DECADRON) 4 MG tablet Take 5 tablets (20 mg total) by mouth once a week. 11/29/16  Yes Heath Lark, MD  folic acid (FOLVITE) 1 MG tablet Take 1 mg by mouth daily. 08/02/16  Yes Historical Provider, MD  gabapentin (NEURONTIN) 300 MG capsule Take 2 capsules (600 mg total) by mouth 2 (two) times daily.  Patient taking differently: Take 300-600 mg by mouth 2 (two) times daily. Take 300 mg in the morning and 600 mg at night. 10/11/16  Yes Heath Lark, MD  lidocaine-prilocaine (EMLA) cream Apply to affected area once 05/12/16  Yes Heath Lark, MD  lisinopril (PRINIVIL,ZESTRIL) 20 MG tablet Take 1 tablet  (20 mg total) by mouth daily. 06/23/16  Yes Heath Lark, MD  loperamide (IMODIUM) 2 MG capsule Take 2 mg by mouth 4 (four) times daily as needed. 08/10/16  Yes Historical Provider, MD  Multiple Vitamin (MULTIVITAMIN) tablet Take 1 tablet by mouth daily. 08/02/16  Yes Historical Provider, MD  ondansetron (ZOFRAN) 8 MG tablet Take 8 mg by mouth every 8 (eight) hours as needed. 08/10/16  Yes Historical Provider, MD  pantoprazole (PROTONIX) 40 MG tablet Take 1 tablet (40 mg total) by mouth daily. 06/23/16  Yes Heath Lark, MD  pomalidomide (POMALYST) 3 MG capsule Take 1 capsule (3 mg total) by mouth daily. Take with water on days 1-21. Repeat every 28 days. 11/18/16  Yes Heath Lark, MD  prochlorperazine (COMPAZINE) 10 MG tablet Take 10 mg by mouth every 6 (six) hours as needed. 07/25/16  Yes Historical Provider, MD  ranitidine (ZANTAC) 150 MG tablet Take 1 tablet (150 mg total) by mouth at bedtime. 11/18/16 03/18/17 Yes Heath Lark, MD  sulfamethoxazole-trimethoprim (BACTRIM DS,SEPTRA DS) 800-160 MG tablet Take 1 tablet by mouth 3 (three) times a week. 09/05/16  Yes Historical Provider, MD  traMADol (ULTRAM) 50 MG tablet TAKE 1 TO 2 TABLETS BY MOUTH EVERY 6 HOURS AS NEEDED FOR PAIN 09/27/16  Yes Heath Lark, MD  zolpidem (AMBIEN) 10 MG tablet Take 1 tablet (10 mg total) by mouth at bedtime as needed for sleep. 11/29/16 12/29/16 Yes Heath Lark, MD  lidocaine-prilocaine (EMLA) cream Apply 1 application topically as needed. Patient not taking: Reported on 12/09/2016 11/29/16   Heath Lark, MD  triamcinolone (NASACORT AQ) 55 MCG/ACT AERO nasal inhaler Place 1 spray into the nose 2 (two) times daily. Patient not taking: Reported on 12/09/2016 12/08/16   Heath Lark, MD    Allergies Benadryl [diphenhydramine]; Trazodone and nefazodone; and Latex  Family History  Problem Relation Age of Onset  . Asthma Mother   . Cancer Father   . Prostate cancer Father   . Colon cancer Neg Hx   . Rectal cancer Neg Hx   . Stomach cancer  Neg Hx     Social History Social History  Substance Use Topics  . Smoking status: Former Smoker    Packs/day: 1.00    Types: Cigarettes  . Smokeless tobacco: Never Used  . Alcohol use No     Comment: last use Jan 2015    Review of Systems  Constitutional: + fever and malaise Eyes: Negative for visual changes. ENT: Negative for sore throat. Neck: No neck pain  Cardiovascular: + chest pain. Respiratory: + shortness of breath, cough Gastrointestinal: Negative for abdominal pain, vomiting or diarrhea. Genitourinary: Negative for dysuria. Musculoskeletal: Negative for back pain. Skin: Negative for rash. Neurological: Negative for headaches, weakness or numbness. Psych: No SI or HI  ____________________________________________   PHYSICAL EXAM:  VITAL SIGNS: ED Triage Vitals  Enc Vitals Group     BP 12/09/16 1608 (!) 147/100     Pulse Rate 12/09/16 1608 (!) 114     Resp 12/09/16 1608 16     Temp 12/09/16 1608 99.7 F (37.6 C)     Temp Source 12/09/16 1608 Oral     SpO2 12/09/16 1608  98 %     Weight 12/09/16 1609 168 lb (76.2 kg)     Height --      Head Circumference --      Peak Flow --      Pain Score 12/09/16 1720 2     Pain Loc --      Pain Edu? --      Excl. in [place]? --     Constitutional: Alert and oriented. Well appearing and in no apparent distress. HEENT:      Head: Normocephalic and atraumatic.         Eyes: Conjunctivae are normal. Sclera is non-icteric. EOMI. PERRL      Mouth/Throat: Mucous membranes are moist.       Neck: Supple with no signs of meningismus. Cardiovascular: Tachycardic with regular rhythm. No murmurs, gallops, or rubs. 2+ symmetrical distal pulses are present in all extremities. No JVD. Respiratory: Normal respiratory effort. Lungs are clear to auscultation bilaterally. No wheezes, crackles, or rhonchi.  Gastrointestinal: Soft, non tender, and non distended with positive bowel sounds. No rebound or guarding. Genitourinary: No CVA  tenderness. Musculoskeletal: Nontender with normal range of motion in all extremities. No edema, cyanosis, or erythema of extremities. Neurologic: Normal speech and language. Face is symmetric. Moving all extremities. No gross focal neurologic deficits are appreciated. Skin: Skin is warm, dry and intact. No rash noted. Psychiatric: Mood and affect are normal. Speech and behavior are normal.  ____________________________________________   LABS (all labs ordered are listed, but only abnormal results are displayed)  Labs Reviewed  CBC WITH DIFFERENTIAL/PLATELET - Abnormal; Notable for the following:       Result Value   RBC 4.20 (*)    HCT 38.3 (*)    RDW 14.9 (*)    Platelets 144 (*)    Lymphs Abs 0.6 (*)    All other components within normal limits  COMPREHENSIVE METABOLIC PANEL - Abnormal; Notable for the following:    Glucose, Bld 103 (*)    Total Protein 6.2 (*)    All other components within normal limits  URINALYSIS, COMPLETE (UACMP) WITH MICROSCOPIC - Abnormal; Notable for the following:    Color, Urine YELLOW (*)    APPearance CLEAR (*)    Squamous Epithelial / LPF 0-5 (*)    All other components within normal limits  MRSA PCR SCREENING  CULTURE, BLOOD (ROUTINE X 2)  CULTURE, BLOOD (ROUTINE X 2)  URINE CULTURE  LACTIC ACID, PLASMA  LACTIC ACID, PLASMA  INFLUENZA PANEL BY PCR (TYPE A & B)  HIV ANTIBODY (ROUTINE TESTING)  BASIC METABOLIC PANEL  CBC   ____________________________________________  EKG  ED ECG REPORT I, Rudene Re, the attending physician, personally viewed and interpreted this ECG.  Sinus tachycardia, rate of 106, normal intervals, normal axis, no ST elevations or depressions. ____________________________________________  RADIOLOGY  CXR:  Negative ____________________________________________   PROCEDURES  Procedure(s) performed: None Procedures Critical Care performed: yes  CRITICAL CARE Performed by: Rudene Re  ?  Total critical care time: 35 min  Critical care time was exclusive of separately billable procedures and treating other patients.  Critical care was necessary to treat or prevent imminent or life-threatening deterioration.  Critical care was time spent personally by me on the following activities: development of treatment plan with patient and/or surrogate as well as nursing, discussions with consultants, evaluation of patient's response to treatment, examination of patient, obtaining history from patient or surrogate, ordering and performing treatments and interventions, ordering and review of laboratory  studies, ordering and review of radiographic studies, pulse oximetry and re-evaluation of patient's condition.  ____________________________________________   INITIAL IMPRESSION / ASSESSMENT AND PLAN / ED COURSE   61 y.o. male with a history of multiple myeloma currently on chemotherapy who presents for evaluation of fever, cough, malaise for 3 days. Patient meets sepsis criteria upon arrival specially due to the fact the patient is immune suppressed. Sepsis protocol initiated. She was given IV fluids, IV Zosyn and vancomycin. Patient will be admitted to the hospitalist service.     Pertinent labs & imaging results that were available during my care of the patient were reviewed by me and considered in my medical decision making (see chart for details).    ____________________________________________   FINAL CLINICAL IMPRESSION(S) / ED DIAGNOSES  Final diagnoses:  Immunosuppressed status ([place])  Sepsis, due to unspecified organism ([place])      NEW MEDICATIONS STARTED DURING THIS VISIT:  Current Discharge Medication List       Note:  This document was prepared using Dragon voice recognition software and may include unintentional dictation errors.    Rudene Re, MD 12/09/16 2124

## 2016-12-09 NOTE — ED Notes (Signed)
First Nurse Note   States he is a cancer pt.and having a fever

## 2016-12-09 NOTE — Progress Notes (Signed)
Pharmacy Antibiotic Note  Erik Nguyen is a 61 y.o. male with a h/o CA admitted on 12/09/2016 with sepsis.  Pharmacy has been consulted for vancomycin and cefepime dosing.  Plan: ke= 0.063 h-1, VD= 53 L  Vancomycin 1000 mg iv q 12 hours with stacked dosing and a trough with the 4th dose. Goal trough 15-20 mcg/ml.   Cefepime 2 g iv q 8 hours.   Weight: 168 lb (76.2 kg)  Temp (24hrs), Avg:99.5 F (37.5 C), Min:99.2 F (37.3 C), Max:99.7 F (37.6 C)   Recent Labs Lab 12/06/16 0831 12/09/16 1712 12/09/16 1713  WBC 9.6 7.0  --   CREATININE 1.2 1.04  --   LATICACIDVEN  --   --  1.0    Estimated Creatinine Clearance: 67.3 mL/min (by C-G formula based on SCr of 1.04 mg/dL).    Allergies  Allergen Reactions  . Benadryl [Diphenhydramine] Other (See Comments)    priapism  . Trazodone And Nefazodone     priapism  . Latex Itching and Rash    Antimicrobials this admission: Zosyn 4/27 x 1 Vancomycin  4/27 >>  Cefepime 4/27 >>  Dose adjustments this admission:   Microbiology results: 4/27 BCx: sent 4/27 UCx: sent  4/27 MRSA PCR: sent  Thank you for allowing pharmacy to be a part of this patient's care.  Ulice Dash D 12/09/2016 7:11 PM

## 2016-12-09 NOTE — ED Triage Notes (Signed)
Pt to ED via POV for fever and cough. Pt is cancer pt, last chem on Tuesday. Pt states that he has had non-productive cough x 3 days and fever that started today, Tmax at home 100.7. Pt has been using OTC cold medications. Pt states that he is having some shortness of breath and pain in his ribs when coughing.   Pt tachycardiac in triage. Breathing is equal and unlabored at this time, color WNL.

## 2016-12-09 NOTE — H&P (Signed)
Erik Nguyen at [facility] NAME: Erik Nguyen    MR#:  [id number]  DATE OF BIRTH:  30-Jun-1956  DATE OF ADMISSION:  12/09/2016  PRIMARY CARE PHYSICIAN: Arnoldo Morale, MD   REQUESTING/REFERRING PHYSICIAN: dr Alfred Levins  CHIEF COMPLAINT:   fever HISTORY OF PRESENT ILLNESS:  Erik Nguyen  is a 61 y.o. 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 Patient reports that his temperature was 100.8.at home. He took OTC cold medications and came to ED for evaluation. He reports nonproductive cough over the past 3 days.Patient denies shortness of breath, chills, urinary symptoms abdominal pain.  Chest x-ray does not show pneumonia. He is started on broad-spectrum antibiotics His last chemotherapy was last Tuesday.  PAST MEDICAL HISTORY:   Past Medical History:  Diagnosis Date  . Encounter for antineoplastic chemotherapy 02/09/2016  . History of syphilis 02/24/2016  . Hypertension Dx 2016  . Multiple myeloma not having achieved remission ([place]) 09/01/2015  . Priapism 2014     PAST SURGICAL HISTORY:   Past Surgical History:  Procedure Laterality Date  . COLONOSCOPY    . POLYPECTOMY      SOCIAL HISTORY:   Social History  Substance Use Topics  . Smoking status: Former Smoker    Packs/day: 1.00    Types: Cigarettes  . Smokeless tobacco: Never Used  . Alcohol use No     Comment: last use Jan 2015    FAMILY HISTORY:   Family History  Problem Relation Age of Onset  . Asthma Mother   . Cancer Father   . Prostate cancer Father   . Colon cancer Neg Hx   . Rectal cancer Neg Hx   . Stomach cancer Neg Hx     DRUG ALLERGIES:   Allergies  Allergen Reactions  . Benadryl [Diphenhydramine] Other (See Comments)    priapism  . Trazodone And Nefazodone     priapism  . Latex Itching and Rash    REVIEW OF SYSTEMS:    Review of Systems  Constitutional: Negative.  Negative for chills, fever and malaise/fatigue.  HENT: Negative.  Negative for ear discharge, ear pain, hearing loss, nosebleeds and sore throat.   Eyes: Negative.  Negative for blurred vision and pain.  Respiratory: Negative.  Negative for cough, hemoptysis, shortness of breath and wheezing.   Cardiovascular: Negative.  Negative for chest pain, palpitations and leg swelling.  Gastrointestinal: Negative.  Negative for abdominal pain, blood in stool, diarrhea, nausea and vomiting.  Genitourinary: Negative.  Negative for dysuria.  Musculoskeletal: Negative.  Negative for back pain.  Skin: Negative.   Neurological: Negative for dizziness, tremors, speech change, focal weakness, seizures and headaches.  Endo/Heme/Allergies: Negative.  Does not bruise/bleed easily.  Psychiatric/Behavioral: Negative.  Negative for depression, hallucinations and suicidal ideas.    MEDICATIONS AT HOME:   Prior to Admission medications   Medication Sig Start Date End Date Taking? Authorizing Provider  acyclovir (ZOVIRAX) 800 MG tablet Take 800 mg by mouth 2 (two) times daily. 08/01/16   Historical Provider, MD  aluminum-magnesium hydroxide-simethicone (MAALOX) 811-914-78 MG/5ML SUSP Take 30 mLs by mouth every 4 (four) hours as needed.    Historical Provider, MD  CALCIUM PO Take 1 tablet by mouth daily.    Historical Provider, MD  cholecalciferol (VITAMIN D) 1000 units tablet Take 1,000 Units by mouth daily.     Historical  Provider, MD  dexamethasone (DECADRON) 4 MG tablet Take 5 tablets (20 mg total) by mouth once a week. 11/29/16   Heath Lark, MD  folic acid (FOLVITE) 1 MG tablet Take 1 mg by mouth daily. 08/02/16   Historical Provider, MD  gabapentin (NEURONTIN) 300 MG capsule Take 2 capsules (600 mg total) by mouth 2 (two) times daily. 10/11/16   Heath Lark, MD  lidocaine-prilocaine (EMLA) cream Apply to affected area once 05/12/16   Heath Lark, MD   lidocaine-prilocaine (EMLA) cream Apply 1 application topically as needed. 11/29/16   Heath Lark, MD  lisinopril (PRINIVIL,ZESTRIL) 20 MG tablet Take 1 tablet (20 mg total) by mouth daily. 06/23/16   Heath Lark, MD  loperamide (IMODIUM) 2 MG capsule Take 2 mg by mouth 4 (four) times daily as needed. 08/10/16   Historical Provider, MD  Multiple Vitamin (MULTIVITAMIN) tablet Take 1 tablet by mouth daily. 08/02/16   Historical Provider, MD  nystatin (MYCOSTATIN) 100000 UNIT/ML suspension Take 5 mLs by mouth 4 (four) times daily as needed. 08/04/16   Historical Provider, MD  ondansetron (ZOFRAN) 8 MG tablet Take 8 mg by mouth every 8 (eight) hours as needed. 08/10/16   Historical Provider, MD  pantoprazole (PROTONIX) 40 MG tablet Take 1 tablet (40 mg total) by mouth daily. 06/23/16   Heath Lark, MD  pomalidomide (POMALYST) 3 MG capsule Take 1 capsule (3 mg total) by mouth daily. Take with water on days 1-21. Repeat every 28 days. Patient not taking: Reported on 11/21/2016 11/18/16   Heath Lark, MD  prochlorperazine (COMPAZINE) 10 MG tablet Take 10 mg by mouth every 6 (six) hours as needed. 07/25/16   Historical Provider, MD  ranitidine (ZANTAC) 150 MG tablet Take 1 tablet (150 mg total) by mouth at bedtime. 11/18/16 03/18/17  Heath Lark, MD  sulfamethoxazole-trimethoprim (BACTRIM DS,SEPTRA DS) 800-160 MG tablet Take 1 tablet by mouth 3 (three) times a week. 09/05/16   Historical Provider, MD  traMADol (ULTRAM) 50 MG tablet TAKE 1 TO 2 TABLETS BY MOUTH EVERY 6 HOURS AS NEEDED FOR PAIN 09/27/16   Heath Lark, MD  triamcinolone (NASACORT AQ) 55 MCG/ACT AERO nasal inhaler Place 1 spray into the nose 2 (two) times daily. 12/08/16   Heath Lark, MD  zolpidem (AMBIEN) 10 MG tablet Take 1 tablet (10 mg total) by mouth at bedtime as needed for sleep. 11/29/16 12/29/16  Heath Lark, MD      VITAL SIGNS:  Blood pressure (!) 147/100, pulse (!) 114, temperature 99.7 F (37.6 C), temperature source Oral, resp. rate 16, weight 76.2  kg (168 lb), SpO2 98 %.  PHYSICAL EXAMINATION:   Physical Exam    LABORATORY PANEL:   CBC  Recent Labs Lab 12/06/16 0831  WBC 9.6  HGB 13.1  HCT 36.8*  PLT 196   ------------------------------------------------------------------------------------------------------------------  Chemistries   Recent Labs Lab 12/06/16 0831  NA 142  K 3.9  CO2 21*  GLUCOSE 215*  BUN 14.7  CREATININE 1.2  CALCIUM 9.4  AST 9  ALT 16  ALKPHOS 50  BILITOT 0.36   ------------------------------------------------------------------------------------------------------------------  Cardiac Enzymes No results for input(s): TROPONINI in the last 168 hours. ------------------------------------------------------------------------------------------------------------------  RADIOLOGY:  Dg Chest 2 View  Result Date: 12/09/2016 CLINICAL DATA:  Shortness of breath, cough, and chest pressure. Former smoker. History of multiple myeloma on chemotherapy. EXAM: CHEST  2 VIEW COMPARISON:  05/23/2016 FINDINGS: Right jugular Port-A-Cath terminates over the lower SVC, unchanged. Cardiomediastinal silhouette is within normal limits. The lungs are well inflated and  clear. No pleural effusion or pneumothorax is identified. A lytic lesion is again seen in the right clavicle, and a lytic lesion and pathologic fracture involving the T9 vertebral body are similar to the prior study, consistent with history of multiple myeloma. IMPRESSION: No active cardiopulmonary disease. Electronically Signed   By: Logan Bores M.D.   On: 12/09/2016 16:44    EKG:   none  IMPRESSION AND PLAN:   61 year old male with history of multiple myeloma status postautologous stem cell transplantCurrently on chemotherapy presents with sepsis.   1. Sepsis with fever and tachycardia of unclear etiology In an immunosuppressed patient Continue cefepime and vancomycin Oncology evaluation Follow-up on final blood and urine cx  2.Multiple  myeloma Not yet in remission: Oncology evaluation     All the records are reviewed and case discussed with ED provider. Management plans discussed with the patient and he is in agreement  CODE STATUS: FULL  TOTAL TIME TAKING CARE OF THIS PATIENT: 45 minutes.    Dawnelle Warman M.D on 12/09/2016 at 5:38 PM  Between 7am to 6pm - Pager - [phone]  After 6pm go to [email or web address] - password [email or web address]  Office  [phone]  CC: Primary care physician; Arnoldo Morale, MD

## 2016-12-09 NOTE — ED Notes (Addendum)
Pt stating that his sx started on Tuesday. Pt had 100.65F at home today. Pt has been experiencing cough and general malaise. Pt is refusing to have 2nd blood culture drawn. Pt stating that he is an extremely hard stick and will not be stuck.  Dr. Alfred Levins is aware and stating that pt has a right to refuse. Pt is in NAD at this time. Pt stating pain only when he coughs and it is in his rib cage. Pt's 1st set of blood culture was drawn from pt's implanted port.

## 2016-12-09 NOTE — ED Notes (Signed)
Code sepsis called to [place] at [facility]

## 2016-12-10 ENCOUNTER — Inpatient Hospital Stay: Payer: Medicaid Other

## 2016-12-10 DIAGNOSIS — Z8042 Family history of malignant neoplasm of prostate: Secondary | ICD-10-CM

## 2016-12-10 DIAGNOSIS — Z87891 Personal history of nicotine dependence: Secondary | ICD-10-CM

## 2016-12-10 DIAGNOSIS — Z7952 Long term (current) use of systemic steroids: Secondary | ICD-10-CM

## 2016-12-10 DIAGNOSIS — Z79899 Other long term (current) drug therapy: Secondary | ICD-10-CM

## 2016-12-10 DIAGNOSIS — Z8619 Personal history of other infectious and parasitic diseases: Secondary | ICD-10-CM

## 2016-12-10 DIAGNOSIS — Z9484 Stem cells transplant status: Secondary | ICD-10-CM

## 2016-12-10 DIAGNOSIS — C9000 Multiple myeloma not having achieved remission: Secondary | ICD-10-CM

## 2016-12-10 DIAGNOSIS — G629 Polyneuropathy, unspecified: Secondary | ICD-10-CM

## 2016-12-10 DIAGNOSIS — Z9221 Personal history of antineoplastic chemotherapy: Secondary | ICD-10-CM

## 2016-12-10 DIAGNOSIS — R509 Fever, unspecified: Secondary | ICD-10-CM

## 2016-12-10 DIAGNOSIS — Z809 Family history of malignant neoplasm, unspecified: Secondary | ICD-10-CM

## 2016-12-10 DIAGNOSIS — R05 Cough: Secondary | ICD-10-CM

## 2016-12-10 DIAGNOSIS — I10 Essential (primary) hypertension: Secondary | ICD-10-CM

## 2016-12-10 LAB — CBC
HCT: 37.8 % — ABNORMAL LOW (ref 40.0–52.0)
HEMOGLOBIN: 13 g/dL (ref 13.0–18.0)
MCH: 31.9 pg (ref 26.0–34.0)
MCHC: 34.2 g/dL (ref 32.0–36.0)
MCV: 93.3 fL (ref 80.0–100.0)
Platelets: 126 10*3/uL — ABNORMAL LOW (ref 150–440)
RBC: 4.05 MIL/uL — AB (ref 4.40–5.90)
RDW: 15.3 % — ABNORMAL HIGH (ref 11.5–14.5)
WBC: 5.9 10*3/uL (ref 3.8–10.6)

## 2016-12-10 LAB — URINE CULTURE: Culture: NO GROWTH

## 2016-12-10 LAB — BASIC METABOLIC PANEL
Anion gap: 6 (ref 5–15)
BUN: 10 mg/dL (ref 6–20)
CHLORIDE: 108 mmol/L (ref 101–111)
CO2: 24 mmol/L (ref 22–32)
Calcium: 8.4 mg/dL — ABNORMAL LOW (ref 8.9–10.3)
Creatinine, Ser: 0.91 mg/dL (ref 0.61–1.24)
GFR calc non Af Amer: >60 mL/min (ref >60)
Glucose, Bld: 115 mg/dL — ABNORMAL HIGH (ref 65–99)
Potassium: 4 mmol/L (ref 3.5–5.1)
SODIUM: 138 mmol/L (ref 135–145)

## 2016-12-10 MED ORDER — PREDNISONE 50 MG PO TABS: 50.0000 mg | ORAL_TABLET | Freq: Every day | ORAL | 0 refills | Status: DC

## 2016-12-10 MED ORDER — LEVOFLOXACIN 750 MG PO TABS
750.0000 mg | ORAL_TABLET | Freq: Every day | ORAL | Status: DC
  Administered 2016-12-10: 750 mg via ORAL
  Filled 2016-12-10: qty 1

## 2016-12-10 MED ORDER — HEPARIN SOD (PORK) LOCK FLUSH 100 UNIT/ML IV SOLN
500.0000 [IU] | INTRAVENOUS | Status: AC | PRN
  Administered 2016-12-10: 500 [IU]
  Filled 2016-12-10: qty 5

## 2016-12-10 MED ORDER — SODIUM CHLORIDE 0.9% FLUSH
10.0000 mL | INTRAVENOUS | Status: AC | PRN
  Administered 2016-12-10: 16:00:00 10 mL

## 2016-12-10 MED ORDER — LEVOFLOXACIN 750 MG PO TABS: 750.0000 mg | ORAL_TABLET | Freq: Every day | ORAL | 0 refills | Status: DC

## 2016-12-10 MED ORDER — GUAIFENESIN-DM 100-10 MG/5ML PO SYRP
5.0000 mL | ORAL_SOLUTION | ORAL | Status: DC | PRN
  Administered 2016-12-10: 06:00:00 5 mL via ORAL
  Filled 2016-12-10: qty 5

## 2016-12-10 MED ORDER — PREDNISONE 50 MG PO TABS
50.0000 mg | ORAL_TABLET | Freq: Every day | ORAL | Status: DC
  Administered 2016-12-10: 09:00:00 50 mg via ORAL
  Filled 2016-12-10: qty 1

## 2016-12-10 MED ORDER — GUAIFENESIN-DM 100-10 MG/5ML PO SYRP: 5.0000 mL | ORAL_SOLUTION | ORAL | 0 refills | Status: DC | PRN

## 2016-12-10 NOTE — Progress Notes (Signed)
[place] NOTE  Patient Care Team: Arnoldo Morale, MD as PCP - General (Family Medicine) Reola Calkins, MD as Consulting Physician (Hematology and Oncology)  CHIEF COMPLAINTS/PURPOSE OF CONSULTATION: Multiple myeloma/worsening cough fever.  HISTORY OF PRESENTING ILLNESS:  Erik Nguyen 61 y.o.  male history of multiple myeloma status post autologous stem cell transplant [December 2017] most recently on pomalyst; dex; daratumumab- started April 2018- follows up with Dr.Gorsuch in [place]. Patient had a second infusion of weekly dara on april24th.   Patient noted to have worsening cough for the last 2 days. Admits to yellow sputum. He also had fevers up to 101.7 at home. He denies any chest pain. Denies any hemoptysis. Does admit to shortness of breath with exertion and also admits to wheezing.  Otherwise denies any sick contacts.  No nausea no vomiting. No diarrhea. No abdominal complaints.   ROS: A complete 10 point review of system is done which is negative except mentioned above in history of present illness  MEDICAL HISTORY:  Past Medical History:  Diagnosis Date  . Encounter for antineoplastic chemotherapy 02/09/2016  . History of syphilis 02/24/2016  . Hypertension Dx 2016  . Multiple myeloma not having achieved remission ([place]) 09/01/2015  . Priapism 2014     SURGICAL HISTORY: Past Surgical History:  Procedure Laterality Date  . COLONOSCOPY    . POLYPECTOMY      SOCIAL HISTORY: Social History   Social History  . Marital status: Married    Spouse name: N/A  . Number of children: N/A  . Years of education: N/A   Occupational History  . Not on file.   Social History Main Topics  . Smoking status: Former Smoker    Packs/day: 1.00    Types: Cigarettes  . Smokeless tobacco: Never Used  . Alcohol use No     Comment: last use Jan 2015  . Drug use: No     Comment: last use Sep 21, 2013  . Sexual activity: Not on file     Comment:  worked fund raising, living in a program, married   Other Topics Concern  . Not on file   Social History Narrative  . No narrative on file    FAMILY HISTORY: Family History  Problem Relation Age of Onset  . Asthma Mother   . Cancer Father   . Prostate cancer Father   . Colon cancer Neg Hx   . Rectal cancer Neg Hx   . Stomach cancer Neg Hx     ALLERGIES:  is allergic to benadryl [diphenhydramine]; trazodone and nefazodone; and latex.  MEDICATIONS:  Current Facility-Administered Medications  Medication Dose Route Frequency Provider Last Rate Last Dose  . acetaminophen (TYLENOL) tablet 650 mg  650 mg Oral Q6H PRN Bettey Costa, MD   650 mg at 12/09/16 2005   Or  . acetaminophen (TYLENOL) suppository 650 mg  650 mg Rectal Q6H PRN Bettey Costa, MD      . acyclovir (ZOVIRAX) 200 MG capsule 800 mg  800 mg Oral BID Bettey Costa, MD   800 mg at 12/10/16 0726  . bisacodyl (DULCOLAX) EC tablet 5 mg  5 mg Oral Daily PRN Bettey Costa, MD   5 mg at 12/10/16 0600  . calcium carbonate (TUMS - dosed in mg elemental calcium) chewable tablet 500 mg  500 mg Oral Daily Bettey Costa, MD   500 mg at 12/10/16 0724  . cholecalciferol (VITAMIN D) tablet 1,000 Units  1,000 Units Oral Daily Sital Mody,  MD   1,000 Units at 12/10/16 0726  . [START ON 12/11/2016] dexamethasone (DECADRON) tablet 20 mg  20 mg Oral Weekly Sital Mody, MD      . enoxaparin (LOVENOX) injection 40 mg  40 mg Subcutaneous Q24H Sital Mody, MD      . famotidine (PEPCID) tablet 10 mg  10 mg Oral Daily Bettey Costa, MD   10 mg at 12/10/16 0724  . folic acid (FOLVITE) tablet 1 mg  1 mg Oral Daily Bettey Costa, MD   1 mg at 12/10/16 0725  . gabapentin (NEURONTIN) capsule 600 mg  600 mg Oral BID Bettey Costa, MD   600 mg at 12/10/16 0725  . guaiFENesin-dextromethorphan (ROBITUSSIN DM) 100-10 MG/5ML syrup 5 mL  5 mL Oral Q4H PRN Saundra Shelling, MD   5 mL at 12/10/16 0620  . HYDROcodone-acetaminophen (NORCO/VICODIN) 5-325 MG per tablet 1-2 tablet  1-2 tablet  Oral Q4H PRN Bettey Costa, MD      . levofloxacin (LEVAQUIN) tablet 750 mg  750 mg Oral Daily Srikar Sudini, MD      . lisinopril (PRINIVIL,ZESTRIL) tablet 20 mg  20 mg Oral Daily Bettey Costa, MD   20 mg at 12/10/16 0726  . multivitamin with minerals tablet 1 tablet  1 tablet Oral Daily Bettey Costa, MD   1 tablet at 12/10/16 0725  . ondansetron (ZOFRAN) tablet 4 mg  4 mg Oral Q6H PRN Bettey Costa, MD       Or  . ondansetron (ZOFRAN) injection 4 mg  4 mg Intravenous Q6H PRN Sital Mody, MD      . pantoprazole (PROTONIX) EC tablet 40 mg  40 mg Oral Daily Bettey Costa, MD   40 mg at 12/10/16 0727  . predniSONE (DELTASONE) tablet 50 mg  50 mg Oral Q breakfast Srikar Sudini, MD      . senna-docusate (Senokot-S) tablet 1 tablet  1 tablet Oral QHS PRN Bettey Costa, MD      . Derrill Memo ON 12/12/2016] sulfamethoxazole-trimethoprim (BACTRIM DS,SEPTRA DS) 800-160 MG per tablet 1 tablet  1 tablet Oral Once per day on Mon Wed Fri Bettey Costa, MD      . traMADol Veatrice Bourbon) tablet 50 mg  50 mg Oral Q6H PRN Bettey Costa, MD      . zolpidem (AMBIEN) tablet 10 mg  10 mg Oral QHS PRN Bettey Costa, MD   10 mg at 12/09/16 2154      .  PHYSICAL EXAMINATION:  Vitals:   12/10/16 0721 12/10/16 0723  BP: (!) 169/107 (!) 159/95  Pulse: 100 100  Resp: 20   Temp: 99.3 F (37.4 C)    Filed Weights   12/09/16 1609 12/10/16 0708  Weight: 168 lb (76.2 kg) 166 lb 9.6 oz (75.6 kg)    GENERAL: Well-nourished well-developed; Alert, no distress and comfortable.   Alone. Sitting up in the bed having breakfast. Intermittent episodes of coughing spells. EYES: no pallor or icterus OROPHARYNX: no thrush or ulceration. NECK: supple, no masses felt LYMPH:  no palpable lymphadenopathy in the cervical, axillary or inguinal regions LUNGS: decreased breath sounds to auscultation at bases and  No wheeze or crackles HEART/CVS: regular rate & rhythm and no murmurs; No lower extremity edema ABDOMEN: abdomen soft, non-tender and normal bowel  sounds Musculoskeletal:no cyanosis of digits and no clubbing  PSYCH: alert & oriented x 3 with fluent speech NEURO: no focal motor/sensory deficits SKIN:  no rashes or significant lesions  LABORATORY DATA:  I have reviewed the data as listed  Lab Results  Component Value Date   WBC 5.9 12/10/2016   HGB 13.0 12/10/2016   HCT 37.8 (L) 12/10/2016   MCV 93.3 12/10/2016   PLT 126 (L) 12/10/2016    Recent Labs  06/15/16 0720  11/29/16 0742 12/06/16 0831 12/09/16 1712 12/10/16 0626  NA 140  < > 143 142 137 138  K 3.8  < > 3.9 3.9 4.1 4.0  CL 109  --   --   --  105 108  CO2 23  < > 20* 21* 25 24  GLUCOSE 96  < > 155* 215* 103* 115*  BUN 20  < > 23.1 14._0 CREATININE 0.86  < > 1.3 1.2 1.04 0.91  CALCIUM 8.8*  < > 9.5 9.4 8.9 8.4*  GFRNONAA >60  --   --   --  >60 >60  GFRAA >60  --   --   --  >60 >60  PROT 5.6*  < > 7.2 7.1 6.2*  --   ALBUMIN 3.7  < > 4.3 4.0 3.6  --   AST 19  < > _1 --   ALT 42  < > _2 --   ALKPHOS 39  < > 48 50 44  --   BILITOT 0.7  < > 0.46 0.36 0.5  --   < > = values in this interval not displayed.  RADIOGRAPHIC STUDIES: I have personally reviewed the radiological images as listed and agreed with the findings in the report. Dg Chest 1 View  Result Date: 12/10/2016 CLINICAL DATA:  61 year old male with fever. EXAM: CHEST 1 VIEW COMPARISON:  Chest radiograph dated 12/09/2016 FINDINGS: Right-sided infusion catheter with tip in stable positioning at the level of the cavoatrial junction. The lungs are clear. There is no pleural effusion or pneumothorax. The cardiac silhouette is within normal limits. A 2.2 x 2.5 cm expansile lytic lesion is again seen in the right clavicle with no significant interval change. No acute fracture. IMPRESSION: 1. No acute cardiopulmonary process. 2. Expansile lytic lesion in the right clavicle as seen on the prior radiograph. No acute fracture. Electronically Signed   By: Anner Crete M.D.   On: 12/10/2016 06:24    Dg Chest 2 View  Result Date: 12/09/2016 CLINICAL DATA:  Shortness of breath, cough, and chest pressure. Former smoker. History of multiple myeloma on chemotherapy. EXAM: CHEST  2 VIEW COMPARISON:  05/23/2016 FINDINGS: Right jugular Port-A-Cath terminates over the lower SVC, unchanged. Cardiomediastinal silhouette is within normal limits. The lungs are well inflated and clear. No pleural effusion or pneumothorax is identified. A lytic lesion is again seen in the right clavicle, and a lytic lesion and pathologic fracture involving the T9 vertebral body are similar to the prior study, consistent with history of multiple myeloma. IMPRESSION: No active cardiopulmonary disease. Electronically Signed   By: Logan Bores M.D.   On: 12/09/2016 16:44    ASSESSMENT & PLAN:   # 61 year old male patient with multiple myeloma currently treatment- admitted to the hospital for fever cough.  # Fever cough- suggestive of acute bronchitis. No evidence of pneumonia as based on the imaging. Patient currently on broad-spectrum antibiotics including Zosyn and vancomycin. Blod cultures- Neg at 24 hours. Agree with narrowing the antibiotics to Levaquin. Also recommend prednisone 1 mg/kg x 7 days with a taper. Breathing treatments if patient continues to wheezing.  # Multiple myeloma- currently on Decadron-Pomalyst- Dara. Recommend holding Decadron/Pomalyst- given the acute issues.   #  Peripheral neuropathy- stable continue Neurontin/Noroco.   # DVT GI prophylaxis.   # The above plan of care was discussed with the patient in detail. He agrees.   # Discussed with Dr.Sudini. Patient when discharged will follow up with Dr.Gorsuch for his care.   Thank you Dr. Darvin Neighbours for allowing me to participate in the care of your pleasant patient. Please do not hesitate to contact me with questions or concerns in the interim.     Cammie Sickle, MD 12/10/2016 9:02 AM

## 2016-12-10 NOTE — Plan of Care (Addendum)
Problem: Education: Goal: Knowledge of [facility] Education information/materials will improve Outcome: Progressing Tmax 38.3, received PRN PO Tylenol 650mg .  Dr. Margaretmary Eddy FYI paged.  Pt brought home chemo med, Dr. Margaretmary Eddy paged for order, no order placed d/t fever, med delivered to pharmacy.  VSS otherwise, free of falls during shift.  Requested PRN Ambien for sleep, Dulcolax for constipation.  Received PRN Ambien 10mg , asleep majority of shift.  Dulcolax brought into room, but pt asleep, received in AM.  Requested cough med for dry cough, will receive ordered PRN Robitussin DM.  No other complaints overnight.  Bed in low position, call bell within reach.  WCTM.

## 2016-12-10 NOTE — Progress Notes (Signed)
Reports "feel a lot better."  Low grade fever to normal temperature today. Slept at frequent intervals. Pt meds returned to him at discharge. Pt states he will call his MD who manages the decadron  in regards to prednisone and decadron courses. Oral and written AVS instructions done. Discharge home to self care. Pt will be transported to private vehicle in transport chair.

## 2016-12-10 NOTE — Progress Notes (Signed)
Reports nasal congestion and dry cough chief co's. Noted mild wheezing in left lung without distress. Resting quietly in bed without co's/respiratory distress. MD advised pt can be discharged if no fever by lunchtime. Sleeping inbetween care.

## 2016-12-11 LAB — HIV ANTIBODY (ROUTINE TESTING W REFLEX): HIV Screen 4th Generation wRfx: NONREACTIVE

## 2016-12-13 ENCOUNTER — Emergency Department (HOSPITAL_COMMUNITY): Payer: Medicaid Other

## 2016-12-13 ENCOUNTER — Ambulatory Visit: Payer: Medicaid Other

## 2016-12-13 ENCOUNTER — Telehealth: Payer: Self-pay | Admitting: *Deleted

## 2016-12-13 ENCOUNTER — Inpatient Hospital Stay (HOSPITAL_COMMUNITY)
Admission: EM | Admit: 2016-12-13 | Discharge: 2016-12-16 | DRG: 871 | Disposition: A | Discharge status: Home / Self Care | Payer: Medicaid Other | Attending: Internal Medicine | Admitting: Internal Medicine

## 2016-12-13 ENCOUNTER — Other Ambulatory Visit: Payer: Medicaid Other

## 2016-12-13 ENCOUNTER — Encounter (HOSPITAL_COMMUNITY): Payer: Self-pay | Admitting: Emergency Medicine

## 2016-12-13 DIAGNOSIS — R55 Syncope and collapse: Secondary | ICD-10-CM | POA: Diagnosis present

## 2016-12-13 DIAGNOSIS — Z825 Family history of asthma and other chronic lower respiratory diseases: Secondary | ICD-10-CM | POA: Diagnosis not present

## 2016-12-13 DIAGNOSIS — E860 Dehydration: Secondary | ICD-10-CM | POA: Diagnosis present

## 2016-12-13 DIAGNOSIS — B9781 Human metapneumovirus as the cause of diseases classified elsewhere: Secondary | ICD-10-CM | POA: Diagnosis present

## 2016-12-13 DIAGNOSIS — Z8042 Family history of malignant neoplasm of prostate: Secondary | ICD-10-CM

## 2016-12-13 DIAGNOSIS — T451X5A Adverse effect of antineoplastic and immunosuppressive drugs, initial encounter: Secondary | ICD-10-CM | POA: Diagnosis present

## 2016-12-13 DIAGNOSIS — Z91040 Latex allergy status: Secondary | ICD-10-CM

## 2016-12-13 DIAGNOSIS — C9002 Multiple myeloma in relapse: Secondary | ICD-10-CM | POA: Diagnosis present

## 2016-12-13 DIAGNOSIS — A419 Sepsis, unspecified organism: Secondary | ICD-10-CM | POA: Diagnosis present

## 2016-12-13 DIAGNOSIS — C9001 Multiple myeloma in remission: Secondary | ICD-10-CM | POA: Diagnosis not present

## 2016-12-13 DIAGNOSIS — Z79899 Other long term (current) drug therapy: Secondary | ICD-10-CM

## 2016-12-13 DIAGNOSIS — Z7952 Long term (current) use of systemic steroids: Secondary | ICD-10-CM | POA: Diagnosis not present

## 2016-12-13 DIAGNOSIS — R05 Cough: Secondary | ICD-10-CM

## 2016-12-13 DIAGNOSIS — T380X5A Adverse effect of glucocorticoids and synthetic analogues, initial encounter: Secondary | ICD-10-CM | POA: Diagnosis present

## 2016-12-13 DIAGNOSIS — Z87891 Personal history of nicotine dependence: Secondary | ICD-10-CM

## 2016-12-13 DIAGNOSIS — R739 Hyperglycemia, unspecified: Secondary | ICD-10-CM | POA: Diagnosis present

## 2016-12-13 DIAGNOSIS — F1490 Cocaine use, unspecified, uncomplicated: Secondary | ICD-10-CM | POA: Diagnosis present

## 2016-12-13 DIAGNOSIS — I10 Essential (primary) hypertension: Secondary | ICD-10-CM | POA: Diagnosis present

## 2016-12-13 DIAGNOSIS — A4189 Other specified sepsis: Secondary | ICD-10-CM | POA: Diagnosis not present

## 2016-12-13 DIAGNOSIS — J208 Acute bronchitis due to other specified organisms: Secondary | ICD-10-CM | POA: Diagnosis not present

## 2016-12-13 DIAGNOSIS — D899 Disorder involving the immune mechanism, unspecified: Secondary | ICD-10-CM | POA: Diagnosis not present

## 2016-12-13 DIAGNOSIS — J209 Acute bronchitis, unspecified: Secondary | ICD-10-CM | POA: Diagnosis not present

## 2016-12-13 DIAGNOSIS — C9000 Multiple myeloma not having achieved remission: Secondary | ICD-10-CM | POA: Diagnosis not present

## 2016-12-13 DIAGNOSIS — Z7982 Long term (current) use of aspirin: Secondary | ICD-10-CM

## 2016-12-13 DIAGNOSIS — Z9481 Bone marrow transplant status: Secondary | ICD-10-CM

## 2016-12-13 DIAGNOSIS — R509 Fever, unspecified: Secondary | ICD-10-CM | POA: Diagnosis not present

## 2016-12-13 DIAGNOSIS — R059 Cough, unspecified: Secondary | ICD-10-CM

## 2016-12-13 DIAGNOSIS — J470 Bronchiectasis with acute lower respiratory infection: Secondary | ICD-10-CM | POA: Diagnosis present

## 2016-12-13 DIAGNOSIS — K219 Gastro-esophageal reflux disease without esophagitis: Secondary | ICD-10-CM | POA: Diagnosis present

## 2016-12-13 DIAGNOSIS — Z888 Allergy status to other drugs, medicaments and biological substances status: Secondary | ICD-10-CM | POA: Diagnosis not present

## 2016-12-13 DIAGNOSIS — D61810 Antineoplastic chemotherapy induced pancytopenia: Secondary | ICD-10-CM | POA: Diagnosis present

## 2016-12-13 DIAGNOSIS — R0989 Other specified symptoms and signs involving the circulatory and respiratory systems: Secondary | ICD-10-CM | POA: Diagnosis not present

## 2016-12-13 LAB — COMPREHENSIVE METABOLIC PANEL
ALT: 30 U/L (ref 17–63)
AST: 22 U/L (ref 15–41)
Albumin: 3.9 g/dL (ref 3.5–5.0)
Alkaline Phosphatase: 46 U/L (ref 38–126)
Anion gap: 9 (ref 5–15)
BUN: 20 mg/dL (ref 6–20)
CHLORIDE: 104 mmol/L (ref 101–111)
CO2: 23 mmol/L (ref 22–32)
CREATININE: 1.24 mg/dL (ref 0.61–1.24)
Calcium: 8.8 mg/dL — ABNORMAL LOW (ref 8.9–10.3)
GFR calc non Af Amer: >60 mL/min (ref >60)
Glucose, Bld: 222 mg/dL — ABNORMAL HIGH (ref 65–99)
Potassium: 4.4 mmol/L (ref 3.5–5.1)
SODIUM: 136 mmol/L (ref 135–145)
Total Bilirubin: 0.5 mg/dL (ref 0.3–1.2)
Total Protein: 7 g/dL (ref 6.5–8.1)

## 2016-12-13 LAB — URINALYSIS, ROUTINE W REFLEX MICROSCOPIC
Bacteria, UA: NONE SEEN
Bilirubin Urine: NEGATIVE
HGB URINE DIPSTICK: NEGATIVE
KETONES UR: NEGATIVE mg/dL
LEUKOCYTES UA: NEGATIVE
NITRITE: NEGATIVE
PROTEIN: NEGATIVE mg/dL
Specific Gravity, Urine: 1.009 (ref 1.005–1.030)
Squamous Epithelial / LPF: NONE SEEN
pH: 6 (ref 5.0–8.0)

## 2016-12-13 LAB — CBC WITH DIFFERENTIAL/PLATELET
Basophils Absolute: 0 10*3/uL (ref 0.0–0.1)
Basophils Relative: 0 %
EOS PCT: 0 %
Eosinophils Absolute: 0 10*3/uL (ref 0.0–0.7)
HEMATOCRIT: 39.5 % (ref 39.0–52.0)
HEMOGLOBIN: 13.2 g/dL (ref 13.0–17.0)
LYMPHS ABS: 0.3 10*3/uL — AB (ref 0.7–4.0)
LYMPHS PCT: 11 %
MCH: 31 pg (ref 26.0–34.0)
MCHC: 33.4 g/dL (ref 30.0–36.0)
MCV: 92.7 fL (ref 78.0–100.0)
MONOS PCT: 4 %
Monocytes Absolute: 0.1 10*3/uL (ref 0.1–1.0)
Neutro Abs: 2.4 10*3/uL (ref 1.7–7.7)
Neutrophils Relative %: 85 %
Platelets: 108 10*3/uL — ABNORMAL LOW (ref 150–400)
RBC: 4.26 MIL/uL (ref 4.22–5.81)
RDW: 14.4 % (ref 11.5–15.5)
WBC: 2.8 10*3/uL — AB (ref 4.0–10.5)

## 2016-12-13 LAB — I-STAT CG4 LACTIC ACID, ED
Lactic Acid, Venous: 1.49 mmol/L (ref 0.5–1.9)
Lactic Acid, Venous: 2.59 mmol/L (ref 0.5–1.9)

## 2016-12-13 LAB — I-STAT TROPONIN, ED: Troponin i, poc: 0 ng/mL (ref 0.00–0.08)

## 2016-12-13 LAB — GLUCOSE, CAPILLARY: GLUCOSE-CAPILLARY: 267 mg/dL — AB (ref 65–99)

## 2016-12-13 MED ORDER — GABAPENTIN 300 MG PO CAPS
300.0000 mg | ORAL_CAPSULE | Freq: Every day | ORAL | Status: DC
  Administered 2016-12-14 – 2016-12-16 (×3): 300 mg via ORAL
  Filled 2016-12-13 (×3): qty 1

## 2016-12-13 MED ORDER — FAMOTIDINE 20 MG PO TABS
20.0000 mg | ORAL_TABLET | Freq: Every day | ORAL | Status: DC
  Administered 2016-12-14 – 2016-12-16 (×3): 20 mg via ORAL
  Filled 2016-12-13 (×3): qty 1

## 2016-12-13 MED ORDER — ONDANSETRON HCL 4 MG PO TABS: 8.0000 mg | ORAL_TABLET | Freq: Three times a day (TID) | ORAL | Status: DC | PRN

## 2016-12-13 MED ORDER — DEXTROSE 5 % IV SOLN
1.0000 g | Freq: Three times a day (TID) | INTRAVENOUS | Status: DC
  Administered 2016-12-13 – 2016-12-16 (×9): 1 g via INTRAVENOUS
  Filled 2016-12-13 (×10): qty 1

## 2016-12-13 MED ORDER — INSULIN ASPART 100 UNIT/ML ~~LOC~~ SOLN
0.0000 [IU] | Freq: Three times a day (TID) | SUBCUTANEOUS | Status: DC
  Administered 2016-12-14: 3 [IU] via SUBCUTANEOUS
  Administered 2016-12-14: 5 [IU] via SUBCUTANEOUS
  Administered 2016-12-14: 3 [IU] via SUBCUTANEOUS

## 2016-12-13 MED ORDER — IOPAMIDOL (ISOVUE-370) INJECTION 76%
INTRAVENOUS | Status: AC
  Filled 2016-12-13: qty 100

## 2016-12-13 MED ORDER — POMALIDOMIDE 3 MG PO CAPS: 3.0000 mg | ORAL_CAPSULE | Freq: Every day | ORAL | Status: DC

## 2016-12-13 MED ORDER — SODIUM CHLORIDE 0.9 % IV BOLUS (SEPSIS)
1000.0000 mL | Freq: Once | INTRAVENOUS | Status: AC
  Administered 2016-12-13: 1000 mL via INTRAVENOUS

## 2016-12-13 MED ORDER — ASPIRIN 81 MG PO CHEW
81.0000 mg | CHEWABLE_TABLET | Freq: Every day | ORAL | Status: DC
  Administered 2016-12-14 – 2016-12-16 (×3): 81 mg via ORAL
  Filled 2016-12-13 (×3): qty 1

## 2016-12-13 MED ORDER — IOPAMIDOL (ISOVUE-370) INJECTION 76%
INTRAVENOUS | Status: AC
  Administered 2016-12-13: 100 mL via INTRAVENOUS
  Filled 2016-12-13: qty 100

## 2016-12-13 MED ORDER — GABAPENTIN 300 MG PO CAPS
600.0000 mg | ORAL_CAPSULE | Freq: Every day | ORAL | Status: DC
  Administered 2016-12-13 – 2016-12-15 (×3): 600 mg via ORAL
  Filled 2016-12-13 (×3): qty 2

## 2016-12-13 MED ORDER — ENOXAPARIN SODIUM 40 MG/0.4ML ~~LOC~~ SOLN
40.0000 mg | Freq: Every day | SUBCUTANEOUS | Status: DC
  Administered 2016-12-13 – 2016-12-15 (×3): 40 mg via SUBCUTANEOUS
  Filled 2016-12-13 (×3): qty 0.4

## 2016-12-13 MED ORDER — ALBUTEROL SULFATE (2.5 MG/3ML) 0.083% IN NEBU: 2.5000 mg | INHALATION_SOLUTION | RESPIRATORY_TRACT | Status: DC | PRN

## 2016-12-13 MED ORDER — ZOLPIDEM TARTRATE 10 MG PO TABS
10.0000 mg | ORAL_TABLET | Freq: Every evening | ORAL | Status: DC | PRN
  Administered 2016-12-14 – 2016-12-15 (×3): 10 mg via ORAL
  Filled 2016-12-13 (×3): qty 1

## 2016-12-13 MED ORDER — SODIUM CHLORIDE 0.9% FLUSH
3.0000 mL | Freq: Two times a day (BID) | INTRAVENOUS | Status: DC
  Administered 2016-12-15 – 2016-12-16 (×2): 3 mL via INTRAVENOUS

## 2016-12-13 MED ORDER — ALBUTEROL SULFATE (2.5 MG/3ML) 0.083% IN NEBU
5.0000 mg | INHALATION_SOLUTION | Freq: Once | RESPIRATORY_TRACT | Status: AC
  Administered 2016-12-13: 5 mg via RESPIRATORY_TRACT
  Filled 2016-12-13: qty 6

## 2016-12-13 MED ORDER — PROCHLORPERAZINE MALEATE 10 MG PO TABS: 10.0000 mg | ORAL_TABLET | Freq: Four times a day (QID) | ORAL | Status: DC | PRN

## 2016-12-13 MED ORDER — ACETAMINOPHEN 500 MG PO TABS
1000.0000 mg | ORAL_TABLET | Freq: Four times a day (QID) | ORAL | Status: DC | PRN
  Administered 2016-12-16: 1000 mg via ORAL
  Filled 2016-12-13: qty 2

## 2016-12-13 MED ORDER — PANTOPRAZOLE SODIUM 40 MG PO TBEC
40.0000 mg | DELAYED_RELEASE_TABLET | Freq: Every day | ORAL | Status: DC
  Administered 2016-12-14 – 2016-12-16 (×3): 40 mg via ORAL
  Filled 2016-12-13 (×3): qty 1

## 2016-12-13 MED ORDER — DEXAMETHASONE 4 MG PO TABS: 20.0000 mg | ORAL_TABLET | ORAL | Status: DC

## 2016-12-13 MED ORDER — SULFAMETHOXAZOLE-TRIMETHOPRIM 800-160 MG PO TABS
1.0000 | ORAL_TABLET | ORAL | Status: DC
  Administered 2016-12-14 – 2016-12-16 (×2): 1 via ORAL
  Filled 2016-12-13 (×2): qty 1

## 2016-12-13 MED ORDER — ALBUTEROL (5 MG/ML) CONTINUOUS INHALATION SOLN
10.0000 mg/h | INHALATION_SOLUTION | RESPIRATORY_TRACT | Status: DC
  Administered 2016-12-13: 10 mg/h via RESPIRATORY_TRACT
  Filled 2016-12-13: qty 20

## 2016-12-13 MED ORDER — ACYCLOVIR 400 MG PO TABS
800.0000 mg | ORAL_TABLET | Freq: Two times a day (BID) | ORAL | Status: DC
  Administered 2016-12-13 – 2016-12-16 (×6): 800 mg via ORAL
  Filled 2016-12-13 (×6): qty 2

## 2016-12-13 MED ORDER — VITAMIN D3 25 MCG (1000 UNIT) PO TABS
1000.0000 [IU] | ORAL_TABLET | Freq: Every day | ORAL | Status: DC
  Administered 2016-12-14 – 2016-12-16 (×3): 1000 [IU] via ORAL
  Filled 2016-12-13 (×3): qty 1

## 2016-12-13 MED ORDER — FOLIC ACID 1 MG PO TABS
1.0000 mg | ORAL_TABLET | Freq: Every day | ORAL | Status: DC
  Administered 2016-12-14 – 2016-12-16 (×3): 1 mg via ORAL
  Filled 2016-12-13 (×3): qty 1

## 2016-12-13 MED ORDER — GUAIFENESIN-DM 100-10 MG/5ML PO SYRP
5.0000 mL | ORAL_SOLUTION | ORAL | Status: DC | PRN
  Administered 2016-12-14 – 2016-12-15 (×3): 5 mL via ORAL
  Filled 2016-12-13 (×3): qty 10

## 2016-12-13 MED ORDER — LOPERAMIDE HCL 2 MG PO CAPS: 2.0000 mg | ORAL_CAPSULE | Freq: Four times a day (QID) | ORAL | Status: DC | PRN

## 2016-12-13 MED ORDER — ADULT MULTIVITAMIN W/MINERALS CH
1.0000 | ORAL_TABLET | Freq: Every day | ORAL | Status: DC
  Administered 2016-12-14 – 2016-12-16 (×3): 1 via ORAL
  Filled 2016-12-13 (×3): qty 1

## 2016-12-13 MED ORDER — METHYLPREDNISOLONE SODIUM SUCC 125 MG IJ SOLR
125.0000 mg | Freq: Once | INTRAMUSCULAR | Status: AC
  Administered 2016-12-13: 125 mg via INTRAVENOUS
  Filled 2016-12-13: qty 2

## 2016-12-13 MED ORDER — TRAMADOL HCL 50 MG PO TABS
50.0000 mg | ORAL_TABLET | Freq: Four times a day (QID) | ORAL | Status: DC | PRN
  Administered 2016-12-15: 50 mg via ORAL
  Filled 2016-12-13: qty 1

## 2016-12-13 MED ORDER — LISINOPRIL 20 MG PO TABS
20.0000 mg | ORAL_TABLET | Freq: Every day | ORAL | Status: DC
  Administered 2016-12-14 – 2016-12-16 (×3): 20 mg via ORAL
  Filled 2016-12-13 (×3): qty 1

## 2016-12-13 MED ORDER — ALUM & MAG HYDROXIDE-SIMETH 200-200-20 MG/5ML PO SUSP: 30.0000 mL | ORAL | Status: DC | PRN

## 2016-12-13 MED ORDER — IPRATROPIUM-ALBUTEROL 0.5-2.5 (3) MG/3ML IN SOLN
3.0000 mL | Freq: Three times a day (TID) | RESPIRATORY_TRACT | Status: DC
  Administered 2016-12-14 (×3): 3 mL via RESPIRATORY_TRACT
  Filled 2016-12-13 (×3): qty 3

## 2016-12-13 MED ORDER — PREDNISONE 50 MG PO TABS
50.0000 mg | ORAL_TABLET | Freq: Every day | ORAL | Status: DC
  Administered 2016-12-14: 50 mg via ORAL
  Filled 2016-12-13: qty 1

## 2016-12-13 NOTE — Telephone Encounter (Signed)
Notified of message below

## 2016-12-13 NOTE — ED Triage Notes (Signed)
Patient is complaining of cough x one week.  Last chemo treatment was 12/06/2016.  Patient has been taking OTC medication but no relief.  Patient was admitted to [facility] and discharged 4-28.  He is still having SOB and pain when coughing.  Patient states he is weak and unstable to walk.

## 2016-12-13 NOTE — ED Notes (Signed)
MD [facility] aware of pt's HR.

## 2016-12-13 NOTE — Telephone Encounter (Signed)
-----   Message from Heath Lark, MD sent at 12/12/2016  7:01 AM EDT ----- Regarding: hospitalized I received notification he is hospitalized in [facility]. His Rx tomorrow is cancelled and I am keeping his appt on 5/8. Please let his wife know

## 2016-12-13 NOTE — ED Provider Notes (Signed)
[facility] Provider Note   CSN: [id number] Arrival date & time: 12/13/16  1542     History   Chief Complaint Chief Complaint  Patient presents with  . Hypotension  . Fever    HPI Erik Nguyen is a 61 y.o. male.  HPI Patient with history of multiple myeloma recently admitted for sepsis presents for recurrent low-grade fever, lightheadedness and collapse, cough productive of white and brown sputum and generalized weakness. Patient states he's had a fever up to 100.0 today. Has had ongoing cough but denies shortness of breath or chest pain. Was in the bathroom today and had near syncopal episode. Wife helped him up from the floor. Denied head or neck injury. Denies any lightheadedness currently. Lightheadedness especially with standing. No nausea or vomiting. No abdominal pain. Denies urinary symptoms. Recently admitted at [facility] and treated for bronchitis. He is currently on Levaquin. Past Medical History:  Diagnosis Date  . Encounter for antineoplastic chemotherapy 02/09/2016  . History of syphilis 02/24/2016  . Hypertension Dx 2016  . Multiple myeloma not having achieved remission ([place]) 09/01/2015  . Priapism 2014     Patient Active Problem List   Diagnosis Date Noted  . Acute bronchitis 12/13/2016  . Blood glucose elevated 12/13/2016  . Sepsis ([place]) 12/09/2016  . Goals of care, counseling/discussion 11/18/2016  . 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 Mild protein-calorie malnutrition ([place]) 08/12/2016  . Dysphagia 08/12/2016  . Essential hypertension 05/26/2016  . Hypoxia 05/24/2016  . Dehydration 05/23/2016  . Chest pain 05/18/2016  . GERD (gastroesophageal reflux disease) 05/18/2016  . Blepharoconjunctivitis of both eyes 04/21/2016  . Dyspnea 03/25/2016  . Left leg pain 03/24/2016  . Port catheter in place 02/25/2016  . Pancytopenia due to antineoplastic chemotherapy ([place]) 02/25/2016  . History of syphilis 02/24/2016    . Encounter for antineoplastic chemotherapy 02/09/2016  . Skin rash 12/14/2015  . 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 Antineoplastic chemotherapy induced pancytopenia ([place])   . Bone pain   . Cancer associated pain 09/02/2015  . Multiple myeloma in remission ([place]) 09/01/2015  . 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 Vitamin D deficiency 07/22/2015  . Closed right clavicular fracture 07/17/2015  . Chest wall pain 03/12/2015  . Thoracolumbar back pain 03/12/2015  . Allergic rhinitis 03/12/2015  . Poor dentition 01/15/2015  . Nerve damage 01/15/2015  . Esophagitis 12/28/2014  . Lower GI bleed   . Hemorrhoids 11/06/2014  . HTN (hypertension) 11/06/2014  . Priapism 11/06/2014  . Decreased visual acuity 11/06/2014  . Family history of prostate cancer in father 11/06/2014    Past Surgical History:  Procedure Laterality Date  . COLONOSCOPY    . POLYPECTOMY         Home Medications    Prior to Admission medications   Medication Sig Start Date End Date Taking? Authorizing Provider  acetaminophen (TYLENOL) 500 MG tablet Take 1,000 mg by mouth every 6 (six) hours as needed for fever.   Yes Historical Provider, MD  acyclovir (ZOVIRAX) 800 MG tablet Take 800 mg by mouth 2 (two) times daily. 08/01/16  Yes Historical Provider, MD  aluminum-magnesium hydroxide-simethicone (MAALOX) 426-834-19 MG/5ML SUSP Take 30 mLs by mouth every 4 (four) hours as needed.   Yes Historical Provider, MD  aspirin 81 MG chewable tablet Chew 81 mg by mouth daily.   Yes Historical Provider, MD  CALCIUM PO Take 1 tablet by mouth daily.   Yes Historical Provider, MD  cholecalciferol (VITAMIN D) 1000  units tablet Take 1,000 Units by mouth daily.    Yes Historical Provider, MD  dexamethasone (DECADRON) 4 MG tablet Take 5 tablets (20 mg total) by mouth once a week. 11/29/16  Yes Heath Lark, MD  folic acid (FOLVITE) 1 MG tablet Take 1 mg by mouth daily. 08/02/16  Yes Historical Provider, MD   gabapentin (NEURONTIN) 300 MG capsule Take 2 capsules (600 mg total) by mouth 2 (two) times daily. Patient taking differently: Take 300-600 mg by mouth 2 (two) times daily. Take 300 mg in the morning and 600 mg at night. 10/11/16  Yes Ni Gorsuch, MD  guaiFENesin-dextromethorphan (ROBITUSSIN DM) 100-10 MG/5ML syrup Take 5 mLs by mouth every 4 (four) hours as needed for cough. 12/10/16  Yes Srikar Sudini, MD  lidocaine-prilocaine (EMLA) cream Apply to affected area once 05/12/16  Yes Heath Lark, MD  lisinopril (PRINIVIL,ZESTRIL) 20 MG tablet Take 1 tablet (20 mg total) by mouth daily. 06/23/16  Yes Heath Lark, MD  loperamide (IMODIUM) 2 MG capsule Take 2 mg by mouth 4 (four) times daily as needed. 08/10/16  Yes Historical Provider, MD  Multiple Vitamin (MULTIVITAMIN) tablet Take 1 tablet by mouth daily. 08/02/16  Yes Historical Provider, MD  ondansetron (ZOFRAN) 8 MG tablet Take 8 mg by mouth every 8 (eight) hours as needed. 08/10/16  Yes Historical Provider, MD  pantoprazole (PROTONIX) 40 MG tablet Take 1 tablet (40 mg total) by mouth daily. 06/23/16  Yes Heath Lark, MD  pomalidomide (POMALYST) 3 MG capsule Take 1 capsule (3 mg total) by mouth daily. Take with water on days 1-21. Repeat every 28 days. 11/18/16  Yes Heath Lark, MD  predniSONE (DELTASONE) 50 MG tablet Take 1 tablet (50 mg total) by mouth daily with breakfast. 12/11/16  Yes Srikar Sudini, MD  prochlorperazine (COMPAZINE) 10 MG tablet Take 10 mg by mouth every 6 (six) hours as needed. 07/25/16  Yes Historical Provider, MD  ranitidine (ZANTAC) 150 MG tablet Take 1 tablet (150 mg total) by mouth at bedtime. 11/18/16 03/18/17 Yes Heath Lark, MD  sulfamethoxazole-trimethoprim (BACTRIM DS,SEPTRA DS) 800-160 MG tablet Take 1 tablet by mouth 3 (three) times a week. 09/05/16  Yes Historical Provider, MD  traMADol (ULTRAM) 50 MG tablet TAKE 1 TO 2 TABLETS BY MOUTH EVERY 6 HOURS AS NEEDED FOR PAIN 09/27/16  Yes Heath Lark, MD  zolpidem (AMBIEN) 10 MG tablet  Take 1 tablet (10 mg total) by mouth at bedtime as needed for sleep. 11/29/16 12/29/16 Yes Heath Lark, MD    Family History Family History  Problem Relation Age of Onset  . Asthma Mother   . Cancer Father   . Prostate cancer Father   . Colon cancer Neg Hx   . Rectal cancer Neg Hx   . Stomach cancer Neg Hx     Social History Social History  Substance Use Topics  . Smoking status: Former Smoker    Packs/day: 1.00    Types: Cigarettes  . Smokeless tobacco: Never Used  . Alcohol use No     Comment: last use Jan 2015     Allergies   Benadryl [diphenhydramine]; Trazodone and nefazodone; and Latex   Review of Systems Review of Systems  Constitutional: Positive for chills, fatigue and fever.  HENT: Negative for congestion and sore throat.   Respiratory: Positive for cough. Negative for shortness of breath and wheezing.   Cardiovascular: Negative for chest pain, palpitations and leg swelling.  Gastrointestinal: Negative for abdominal pain, diarrhea, nausea and vomiting.  Genitourinary: Negative for  dysuria, flank pain, frequency and hematuria.  Musculoskeletal: Negative for back pain and neck pain.  Skin: Negative for rash and wound.  Neurological: Positive for dizziness, weakness (Generalized) and light-headedness. Negative for syncope, numbness and headaches.  All other systems reviewed and are negative.    Physical Exam Updated Vital Signs BP (!) 126/109 (BP Location: Right Arm) Comment: pt coughing, could elevate BP  Pulse (!) 102   Temp (!) 100.4 F (38 C) (Oral)   Resp 18   Ht 5' 6"  (1.676 m)   Wt 160 lb 7.9 oz (72.8 kg)   SpO2 97%   BMI 25.90 kg/m   Physical Exam  Constitutional: He is oriented to person, place, and time. He appears well-developed and well-nourished. No distress.  HENT:  Head: Normocephalic and atraumatic.  Mouth/Throat: Oropharynx is clear and moist. No oropharyngeal exudate.  Oropharynx is mildly erythematous.  Eyes: EOM are normal.  Pupils are equal, round, and reactive to light.  Neck: Normal range of motion. Neck supple.  No meningismus. No posterior midline cervical tenderness to palpation.  Cardiovascular: Regular rhythm.  Exam reveals no gallop and no friction rub.   No murmur heard. Tachycardia  Pulmonary/Chest: Effort normal. He has wheezes. He exhibits no tenderness.  Diminished breath sounds right lung field and left base. Questional few scattered end expiratory wheezes.  Abdominal: Soft. Bowel sounds are normal. There is no tenderness. There is no rebound and no guarding.  Musculoskeletal: Normal range of motion. He exhibits no edema or tenderness.  No calf swelling, tenderness. Distal pulses are 2+. No midline thoracic or lumbar tenderness. No CVA tenderness.  Neurological: He is alert and oriented to person, place, and time.  Patient is alert and oriented x3 with clear, goal oriented speech. Patient has 4/5 motor in all extremities. Sensation is intact to light touch.  Skin: Skin is warm and dry. Capillary refill takes less than 2 seconds. No rash noted. No erythema.  Psychiatric: He has a normal mood and affect. His behavior is normal.  Nursing note and vitals reviewed.    ED Treatments / Results  Labs (all labs ordered are listed, but only abnormal results are displayed) Labs Reviewed  RESPIRATORY PANEL BY PCR - Abnormal; Notable for the following:       Result Value   Metapneumovirus DETECTED (*)    All other components within normal limits  COMPREHENSIVE METABOLIC PANEL - Abnormal; Notable for the following:    Glucose, Bld 222 (*)    Calcium 8.8 (*)    All other components within normal limits  CBC WITH DIFFERENTIAL/PLATELET - Abnormal; Notable for the following:    WBC 2.8 (*)    Platelets 108 (*)    Lymphs Abs 0.3 (*)    All other components within normal limits  URINALYSIS, ROUTINE W REFLEX MICROSCOPIC - Abnormal; Notable for the following:    Color, Urine STRAW (*)    Glucose, UA >=500  (*)    All other components within normal limits  CBC - Abnormal; Notable for the following:    WBC 2.6 (*)    RBC 3.96 (*)    Hemoglobin 12.1 (*)    HCT 36.6 (*)    Platelets 101 (*)    All other components within normal limits  BASIC METABOLIC PANEL - Abnormal; Notable for the following:    CO2 20 (*)    Glucose, Bld 271 (*)    Calcium 8.7 (*)    All other components within normal limits  GLUCOSE,  CAPILLARY - Abnormal; Notable for the following:    Glucose-Capillary 267 (*)    All other components within normal limits  GLUCOSE, CAPILLARY - Abnormal; Notable for the following:    Glucose-Capillary 201 (*)    All other components within normal limits  GLUCOSE, CAPILLARY - Abnormal; Notable for the following:    Glucose-Capillary 151 (*)    All other components within normal limits  GLUCOSE, CAPILLARY - Abnormal; Notable for the following:    Glucose-Capillary 199 (*)    All other components within normal limits  CBC WITH DIFFERENTIAL/PLATELET - Abnormal; Notable for the following:    WBC 3.6 (*)    RBC 3.79 (*)    Hemoglobin 12.0 (*)    HCT 34.3 (*)    Platelets 111 (*)    All other components within normal limits  BASIC METABOLIC PANEL - Abnormal; Notable for the following:    Glucose, Bld 118 (*)    Calcium 8.6 (*)    All other components within normal limits  HEMOGLOBIN A1C - Abnormal; Notable for the following:    Hgb A1c MFr Bld 6.0 (*)    All other components within normal limits  GLUCOSE, CAPILLARY - Abnormal; Notable for the following:    Glucose-Capillary 158 (*)    All other components within normal limits  GLUCOSE, CAPILLARY - Abnormal; Notable for the following:    Glucose-Capillary 103 (*)    All other components within normal limits  CBC - Abnormal; Notable for the following:    WBC 2.4 (*)    RBC 4.08 (*)    Hemoglobin 12.7 (*)    HCT 36.6 (*)    Platelets 124 (*)    All other components within normal limits  BASIC METABOLIC PANEL - Abnormal;  Notable for the following:    Calcium 8.5 (*)    All other components within normal limits  GLUCOSE, CAPILLARY - Abnormal; Notable for the following:    Glucose-Capillary 101 (*)    All other components within normal limits  I-STAT CG4 LACTIC ACID, ED - Abnormal; Notable for the following:    Lactic Acid, Venous 2.59 (*)    All other components within normal limits  CULTURE, BLOOD (ROUTINE X 2)  CULTURE, BLOOD (ROUTINE X 2)  CULTURE, EXPECTORATED SPUTUM-ASSESSMENT  CULTURE, RESPIRATORY (NON-EXPECTORATED)  PNEUMOCYSTIS JIROVECI SMEAR BY DFA  LACTIC ACID, PLASMA  LACTATE DEHYDROGENASE  GLUCOSE, CAPILLARY  GLUCOSE, CAPILLARY  GLUCOSE, CAPILLARY  MAGNESIUM  TSH  GLUCOSE, CAPILLARY  I-STAT CG4 LACTIC ACID, ED  I-STAT TROPOININ, ED    EKG  EKG Interpretation  Date/Time:  Tuesday Dec 13 2016 16:58:02 EDT Ventricular Rate:  93 PR Interval:    QRS Duration: 77 QT Interval:  346 QTC Calculation: 431 R Axis:   66 Text Interpretation:  Sinus rhythm RSR' in V1 or V2, right VCD or RVH since last tracing no significant change Confirmed by Daleen Bo [phone]) on 12/15/2016 3:04:55 PM       Radiology No results found.  Procedures Procedures (including critical care time)  Medications Ordered in ED Medications  iopamidol (ISOVUE-370) 76 % injection (not administered)  acyclovir (ZOVIRAX) tablet 800 mg (800 mg Oral Given 12/16/16 1045)  acetaminophen (TYLENOL) tablet 1,000 mg (1,000 mg Oral Given 12/16/16 0628)  zolpidem (AMBIEN) tablet 10 mg (10 mg Oral Given 12/15/16 2211)  traMADol (ULTRAM) tablet 50-100 mg (50 mg Oral Given 12/15/16 2012)  sulfamethoxazole-trimethoprim (BACTRIM DS,SEPTRA DS) 800-160 MG per tablet 1 tablet (1 tablet Oral Given 12/16/16 1050)  famotidine (PEPCID) tablet 20 mg (20 mg Oral Given 12/16/16 1044)  prochlorperazine (COMPAZINE) tablet 10 mg (not administered)  pantoprazole (PROTONIX) EC tablet 40 mg (40 mg Oral Given 12/16/16 1044)  ondansetron (ZOFRAN) tablet 8  mg (not administered)  lisinopril (PRINIVIL,ZESTRIL) tablet 20 mg (20 mg Oral Given 12/16/16 1044)  loperamide (IMODIUM) capsule 2 mg (not administered)  multivitamin with minerals tablet 1 tablet (1 tablet Oral Given 12/16/16 1045)  gabapentin (NEURONTIN) capsule 600 mg (600 mg Oral Given 12/15/16 2211)  aspirin chewable tablet 81 mg (81 mg Oral Given 12/16/16 1044)  alum & mag hydroxide-simeth (MAALOX/MYLANTA) 200-200-20 MG/5ML suspension 30 mL (not administered)  cholecalciferol (VITAMIN D) tablet 1,000 Units (1,000 Units Oral Given 09/16/00 5427)  folic acid (FOLVITE) tablet 1 mg (1 mg Oral Given 12/16/16 1044)  guaiFENesin-dextromethorphan (ROBITUSSIN DM) 100-10 MG/5ML syrup 5 mL (5 mLs Oral Given 12/15/16 1628)  ceFEPIme (MAXIPIME) 1 g in dextrose 5 % 50 mL IVPB (0 g Intravenous Stopped 12/16/16 0550)  insulin aspart (novoLOG) injection 0-15 Units (0 Units Subcutaneous Not Given 12/16/16 0814)  enoxaparin (LOVENOX) injection 40 mg (40 mg Subcutaneous Given 12/15/16 2211)  sodium chloride flush (NS) 0.9 % injection 3 mL (3 mLs Intravenous Given 12/16/16 1046)  albuterol (PROVENTIL) (2.5 MG/3ML) 0.083% nebulizer solution 2.5 mg (not administered)  gabapentin (NEURONTIN) capsule 300 mg (300 mg Oral Given 12/16/16 1044)  sodium chloride flush (NS) 0.9 % injection 10-40 mL (10 mLs Intracatheter Not Given 12/16/16 1046)  sodium chloride flush (NS) 0.9 % injection 10-40 mL (10 mLs Intracatheter Given 12/16/16 0417)  guaiFENesin (MUCINEX) 12 hr tablet 600 mg (600 mg Oral Given 12/16/16 1043)  amLODipine (NORVASC) tablet 5 mg (5 mg Oral Given 12/16/16 1046)  potassium chloride SA (K-DUR,KLOR-CON) CR tablet 40 mEq (not administered)  sodium chloride 0.9 % bolus 1,000 mL (1,000 mLs Intravenous New Bag/Given 12/13/16 1644)  albuterol (PROVENTIL) (2.5 MG/3ML) 0.083% nebulizer solution 5 mg (5 mg Nebulization Given 12/13/16 1757)  iopamidol (ISOVUE-370) 76 % injection (100 mLs Intravenous Contrast Given 12/13/16 1934)  methylPREDNISolone  sodium succinate (SOLU-MEDROL) 125 mg/2 mL injection 125 mg (125 mg Intravenous Given 12/13/16 2017)  sodium chloride 0.9 % bolus 500 mL (500 mLs Intravenous New Bag/Given 12/14/16 2201)     Initial Impression / Assessment and Plan / ED Course  I have reviewed the triage vital signs and the nursing notes.  Pertinent labs & imaging results that were available during my care of the patient were reviewed by me and considered in my medical decision making (see chart for details).     Patient with persistent wheezing and shortness of breath despite several breathing treatments. No definite evidence of pneumonia on x-ray. Discussed with hospitalist and we will admit.  Final Clinical Impressions(s) / ED Diagnoses   Final diagnoses:  Cough    New Prescriptions Current Discharge Medication List       Julianne Rice, MD 12/16/16 1154

## 2016-12-13 NOTE — Telephone Encounter (Signed)
Wife called back to say she is taking Erik Nguyen to the ED.  He states he is still coughing around the clock, BP 113/92, pulse 102. Does not feel any better.

## 2016-12-13 NOTE — H&P (Signed)
History and Physical    Erik Nguyen MVH:[id number] DOB: 28-Mar-1956 DOA: 12/13/2016  PCP: Arnoldo Morale, MD  Patient coming from: Home  I have personally briefly reviewed patient's old medical records in [place]  Chief Complaint: SOB, cough  HPI: Erik Nguyen is a 61 y.o. 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  Immunosuppressed with decadron 6m PO Q weekly on Sundays.  On PPx with acyclovir and 3x weekly bactrim.  [facility], [facility], fever of 100.8 at home for 3 days prior to that point.  He was initially treated with zosyn / vanc and steroids, started feeling better, and was discharged on PO prednisone and levaquin.  CXR was neg for PNA.  BCx negative.  Since then his cough has been persistent, he has been running low grade fevers of Tm ~100 at home.  He presents to ED with worsening SOB today.   ED Course: SOB and wheezing improved with solumedrol and neb treatments.  Still significant tachycardia.  Initial lactate negative, repeat is slightly elevated.  WBC 2.8 with ANC 2.4k.  CT chest just shows bronchitis.   Review of Systems: As per HPI otherwise 10 point review of systems negative.   Past Medical History:  Diagnosis Date  . Encounter for antineoplastic chemotherapy 02/09/2016  . History of syphilis 02/24/2016  . Hypertension Dx 2016  . Multiple myeloma not having achieved remission ([facility] 09/01/2015  . Priapism 2014     Past Surgical History:  Procedure Laterality Date  . COLONOSCOPY    . POLYPECTOMY       reports that he has quit smoking. His smoking use included Cigarettes. He smoked 1.00 pack per day. He has never used smokeless tobacco. He reports that he does not drink alcohol or use drugs.  Allergies  Allergen Reactions  . Benadryl [Diphenhydramine]  Other (See Comments)    priapism  . Trazodone And Nefazodone     priapism  . Latex Itching and Rash    Family History  Problem Relation Age of Onset  . Asthma Mother   . Cancer Father   . Prostate cancer Father   . Colon cancer Neg Hx   . Rectal cancer Neg Hx   . Stomach cancer Neg Hx      Prior to Admission medications   Medication Sig Start Date End Date Taking? Authorizing Provider  acetaminophen (TYLENOL) 500 MG tablet Take 1,000 mg by mouth every 6 (six) hours as needed for fever.   Yes Historical Provider, MD  acyclovir (ZOVIRAX) 800 MG tablet Take 800 mg by mouth 2 (two) times daily. 08/01/16  Yes Historical Provider, MD  aluminum-magnesium hydroxide-simethicone (MAALOX) 2841-324-40MG/5ML SUSP Take 30 mLs by mouth every 4 (four) hours as needed.   Yes Historical Provider, MD  aspirin 81 MG chewable tablet Chew 81 mg by mouth daily.   Yes Historical Provider, MD  CALCIUM PO Take 1 tablet by mouth daily.   Yes Historical Provider, MD  cholecalciferol (VITAMIN D) 1000 units tablet Take 1,000 Units by mouth daily.    Yes Historical Provider, MD  dexamethasone (DECADRON) 4 MG tablet Take 5 tablets (20 mg total) by mouth once a week. 11/29/16  Yes NHeath Lark MD  folic acid (FOLVITE) 1 MG tablet Take 1 mg by mouth daily. 08/02/16  Yes Historical Provider, MD  gabapentin (  NEURONTIN) 300 MG capsule Take 2 capsules (600 mg total) by mouth 2 (two) times daily. Patient taking differently: Take 300-600 mg by mouth 2 (two) times daily. Take 300 mg in the morning and 600 mg at night. 10/11/16  Yes Ni Gorsuch, MD  guaiFENesin-dextromethorphan (ROBITUSSIN DM) 100-10 MG/5ML syrup Take 5 mLs by mouth every 4 (four) hours as needed for cough. 12/10/16  Yes Srikar Sudini, MD  lidocaine-prilocaine (EMLA) cream Apply to affected area once 05/12/16  Yes Heath Lark, MD  lisinopril (PRINIVIL,ZESTRIL) 20 MG tablet Take 1 tablet (20 mg total) by mouth daily. 06/23/16  Yes Heath Lark, MD  loperamide (IMODIUM)  2 MG capsule Take 2 mg by mouth 4 (four) times daily as needed. 08/10/16  Yes Historical Provider, MD  Multiple Vitamin (MULTIVITAMIN) tablet Take 1 tablet by mouth daily. 08/02/16  Yes Historical Provider, MD  ondansetron (ZOFRAN) 8 MG tablet Take 8 mg by mouth every 8 (eight) hours as needed. 08/10/16  Yes Historical Provider, MD  pantoprazole (PROTONIX) 40 MG tablet Take 1 tablet (40 mg total) by mouth daily. 06/23/16  Yes Heath Lark, MD  pomalidomide (POMALYST) 3 MG capsule Take 1 capsule (3 mg total) by mouth daily. Take with water on days 1-21. Repeat every 28 days. 11/18/16  Yes Heath Lark, MD  predniSONE (DELTASONE) 50 MG tablet Take 1 tablet (50 mg total) by mouth daily with breakfast. 12/11/16  Yes Srikar Sudini, MD  prochlorperazine (COMPAZINE) 10 MG tablet Take 10 mg by mouth every 6 (six) hours as needed. 07/25/16  Yes Historical Provider, MD  ranitidine (ZANTAC) 150 MG tablet Take 1 tablet (150 mg total) by mouth at bedtime. 11/18/16 03/18/17 Yes Heath Lark, MD  sulfamethoxazole-trimethoprim (BACTRIM DS,SEPTRA DS) 800-160 MG tablet Take 1 tablet by mouth 3 (three) times a week. 09/05/16  Yes Historical Provider, MD  traMADol (ULTRAM) 50 MG tablet TAKE 1 TO 2 TABLETS BY MOUTH EVERY 6 HOURS AS NEEDED FOR PAIN 09/27/16  Yes Heath Lark, MD  zolpidem (AMBIEN) 10 MG tablet Take 1 tablet (10 mg total) by mouth at bedtime as needed for sleep. 11/29/16 12/29/16 Yes Heath Lark, MD    Physical Exam: Vitals:   12/13/16 1900 12/13/16 1948 12/13/16 2011 12/13/16 2100  BP: (!) 121/91  (!) 140/96 137/84  Pulse: (!) 116  (!) 118 (!) 138  Resp: 18  20 16   Temp:    98.1 F (36.7 C)  TempSrc:    Oral  SpO2: 96% 100% 100% 100%  Weight:      Height:        Constitutional: NAD, calm, comfortable Eyes: PERRL, lids and conjunctivae normal ENMT: Mucous membranes are moist. Posterior pharynx clear of any exudate or lesions.Normal dentition.  Neck: normal, supple, no masses, no thyromegaly Respiratory: Diffuse  wheezing Cardiovascular: Regular rate and rhythm, no murmurs / rubs / gallops. No extremity edema. 2+ pedal pulses. No carotid bruits.  Abdomen: no tenderness, no masses palpated. No hepatosplenomegaly. Bowel sounds positive.  Musculoskeletal: no clubbing / cyanosis. No joint deformity upper and lower extremities. Good ROM, no contractures. Normal muscle tone.  Skin: no rashes, lesions, ulcers. No induration Neurologic: CN 2-12 grossly intact. Sensation intact, DTR normal. Strength 5/5 in all 4.  Psychiatric: Normal judgment and insight. Alert and oriented x 3. Normal mood.    Labs on Admission: I have personally reviewed following labs and imaging studies  CBC:  Recent Labs Lab 12/09/16 1712 12/10/16 0626 12/13/16 1625  WBC 7.0 5.9 2.8*  NEUTROABS 5.4  --  2.4  HGB 13.3 13.0 13.2  HCT 38.3* 37.8* 39.5  MCV 91.2 93.3 92.7  PLT 144* 126* 007*   Basic Metabolic Panel:  Recent Labs Lab 12/09/16 1712 12/10/16 0626 12/13/16 1625  NA 137 138 136  K 4.1 4.0 4.4  CL 105 108 104  CO2 25 24 23   GLUCOSE 103* 115* 222*  BUN 11 10 20   CREATININE 1.04 0.91 1.24  CALCIUM 8.9 8.4* 8.8*   GFR: Estimated Creatinine Clearance: 56.5 mL/min (by C-G formula based on SCr of 1.24 mg/dL). Liver Function Tests:  Recent Labs Lab 12/09/16 1712 12/13/16 1625  AST 20 22  ALT 26 30  ALKPHOS 44 46  BILITOT 0.5 0.5  PROT 6.2* 7.0  ALBUMIN 3.6 3.9   No results for input(s): LIPASE, AMYLASE in the last 168 hours. No results for input(s): AMMONIA in the last 168 hours. Coagulation Profile: No results for input(s): INR, PROTIME in the last 168 hours. Cardiac Enzymes: No results for input(s): CKTOTAL, CKMB, CKMBINDEX, TROPONINI in the last 168 hours. BNP (last 3 results) No results for input(s): PROBNP in the last 8760 hours. HbA1C: No results for input(s): HGBA1C in the last 72 hours. CBG: No results for input(s): GLUCAP in the last 168 hours. Lipid Profile: No results for input(s):  CHOL, HDL, LDLCALC, TRIG, CHOLHDL, LDLDIRECT in the last 72 hours. Thyroid Function Tests: No results for input(s): TSH, T4TOTAL, FREET4, T3FREE, THYROIDAB in the last 72 hours. Anemia Panel: No results for input(s): VITAMINB12, FOLATE, FERRITIN, TIBC, IRON, RETICCTPCT in the last 72 hours. Urine analysis:    Component Value Date/Time   COLORURINE STRAW (A) 12/13/2016 1659   APPEARANCEUR CLEAR 12/13/2016 1659   LABSPEC 1.009 12/13/2016 1659   PHURINE 6.0 12/13/2016 1659   GLUCOSEU >=500 (A) 12/13/2016 1659   HGBUR NEGATIVE 12/13/2016 [place] 12/13/2016 1659   KETONESUR NEGATIVE 12/13/2016 1659   PROTEINUR NEGATIVE 12/13/2016 1659   UROBILINOGEN 1.0 12/28/2014 2005   NITRITE NEGATIVE 12/13/2016 1659   LEUKOCYTESUR NEGATIVE 12/13/2016 1659    Radiological Exams on Admission: Dg Chest 2 View  Result Date: 12/13/2016 CLINICAL DATA:  Cough for 1 week EXAM: CHEST  2 VIEW COMPARISON:  12/10/2016 FINDINGS: Cardiac shadow is within normal limits. Right-sided chest wall port is noted in satisfactory position. The lungs are well aerated bilaterally. No acute bony abnormality is seen. Stable lytic lesion is noted within the right clavicle. IMPRESSION: No acute abnormality noted. Electronically Signed   By: Inez Catalina M.D.   On: 12/13/2016 17:22   Ct Angio Chest Pe W And/or Wo Contrast  Result Date: 12/13/2016 CLINICAL DATA:  Shortness of breath. Near syncope. Multiple myeloma. EXAM: CT ANGIOGRAPHY CHEST WITH CONTRAST TECHNIQUE: Multidetector CT imaging of the chest was performed using the standard protocol during bolus administration of intravenous contrast. Multiplanar CT image reconstructions and MIPs were obtained to evaluate the vascular anatomy. CONTRAST:  Reference EMR for dose.  Patient had 2 boluses. COMPARISON:  05/23/2016 FINDINGS: Cardiovascular: Satisfactory opacification of the pulmonary arteries to the segmental level, but sensitivity is randomly diminished by  respiratory motion. No evidence of pulmonary embolism. Normal heart size. No pericardial effusion. Coronary atherosclerosis. Mediastinum/Nodes: Negative for adenopathy. Porta catheter in good position, tip at the upper cavoatrial junction. Stable patulous appearance of the esophagus. Lungs/Pleura: Advanced diffuse airway thickening with mild atelectasis. No consolidation, edema, effusion, or pneumothorax. Upper Abdomen: No acute finding Musculoskeletal: Diffuse demineralization with numerous lytic lesions in this patient with known multiple myeloma. Pathologic  fracture of the T9 body which is chronic; unchanged height loss. Review of the MIP images confirms the above findings. IMPRESSION: 1. No evidence of pulmonary embolism. Sensitivity is moderately degraded by respiratory motion. 2. Advanced bronchitic type airway thickening with mild atelectasis. 3. Multiple myeloma.  No acute osseous finding. 4. Coronary atherosclerosis. Electronically Signed   By: Monte Fantasia M.D.   On: 12/13/2016 20:46    EKG: Independently reviewed.  Assessment/Plan Principal Problem:   Acute bronchitis Active Problems:   Multiple myeloma in remission (HCC)   S/P autologous bone marrow transplantation ([place])   Blood glucose elevated    1. Acute bronchitis - 1. Continue prednisone 2. Switch levaquin to Cefepime 3. Tele monitor for tachycardia 4. Will put on the "COPD pathway" 5. Trend WBC 2. MM in remission - 1. Last chemo 4/24 by Dr. Alvy Bimler 2. Takes Decadron weekly for bone marrow transplant on Sundays 3. Takes acyclovir and bactrim for PPx 3. Elevated BGL - likely secondary to steroids 1. SSI mod scale AC/HS  DVT prophylaxis: Lovenox Code Status: Full Family Communication: No family in room Disposition Plan: Home after admit Consults called: Put consult in Epic to oncology, none called. Admission status: Admit to inpatient   [place] [place] [facility] Pager [phone]  If 7AM-7PM,  please contact day team taking care of patient [email or web address] Password [email or web address]  12/13/2016, 10:31 PM

## 2016-12-14 ENCOUNTER — Inpatient Hospital Stay (HOSPITAL_COMMUNITY): Payer: Medicaid Other

## 2016-12-14 DIAGNOSIS — Z9481 Bone marrow transplant status: Secondary | ICD-10-CM

## 2016-12-14 DIAGNOSIS — R05 Cough: Secondary | ICD-10-CM

## 2016-12-14 DIAGNOSIS — I10 Essential (primary) hypertension: Secondary | ICD-10-CM

## 2016-12-14 DIAGNOSIS — D61810 Antineoplastic chemotherapy induced pancytopenia: Secondary | ICD-10-CM

## 2016-12-14 DIAGNOSIS — C9002 Multiple myeloma in relapse: Secondary | ICD-10-CM

## 2016-12-14 DIAGNOSIS — R55 Syncope and collapse: Secondary | ICD-10-CM

## 2016-12-14 DIAGNOSIS — C9000 Multiple myeloma not having achieved remission: Secondary | ICD-10-CM

## 2016-12-14 DIAGNOSIS — D899 Disorder involving the immune mechanism, unspecified: Secondary | ICD-10-CM

## 2016-12-14 DIAGNOSIS — A4189 Other specified sepsis: Secondary | ICD-10-CM

## 2016-12-14 LAB — EXPECTORATED SPUTUM ASSESSMENT W GRAM STAIN, RFLX TO RESP C

## 2016-12-14 LAB — ECHOCARDIOGRAM COMPLETE
CHL CUP DOP CALC LVOT VTI: 32.1 cm
CHL CUP LV S' LATERAL: 12.7 cm/s
CHL CUP MV DEC (S): 183
E/e' ratio: 10.89
EWDT: 183 ms
FS: 31 % (ref 28–44)
Height: 66 in
IV/PV OW: 1.44
LA diam index: 1.37 cm/m2
LA vol index: 17.5 mL/m2
LASIZE: 25 mm
LAVOL: 31.8 mL
LAVOLA4C: 34.9 mL
LEFT ATRIUM END SYS DIAM: 25 mm
LV E/e' medial: 10.89
LV E/e'average: 10.89
LV PW d: 9.8 mm — AB (ref 0.6–1.1)
LVELAT: 7.83 cm/s
LVOT SV: 82 mL
LVOT area: 2.54 cm2
LVOT peak grad rest: 12 mmHg
LVOT peak vel: 173 cm/s
LVOTD: 18 mm
Lateral S' vel: 22.9 cm/s
MV Peak grad: 3 mmHg
MV pk A vel: 129 m/s
MVPKEVEL: 85.3 m/s
TAPSE: 25.6 mm
TDI e' lateral: 7.83
TDI e' medial: 8.02
Weight: 2567.92 oz

## 2016-12-14 LAB — BASIC METABOLIC PANEL
Anion gap: 12 (ref 5–15)
BUN: 17 mg/dL (ref 6–20)
CHLORIDE: 107 mmol/L (ref 101–111)
CO2: 20 mmol/L — AB (ref 22–32)
Calcium: 8.7 mg/dL — ABNORMAL LOW (ref 8.9–10.3)
Creatinine, Ser: 1.17 mg/dL (ref 0.61–1.24)
GFR calc Af Amer: >60 mL/min (ref >60)
GFR calc non Af Amer: >60 mL/min (ref >60)
Glucose, Bld: 271 mg/dL — ABNORMAL HIGH (ref 65–99)
POTASSIUM: 4.2 mmol/L (ref 3.5–5.1)
Sodium: 139 mmol/L (ref 135–145)

## 2016-12-14 LAB — EXPECTORATED SPUTUM ASSESSMENT W REFEX TO RESP CULTURE

## 2016-12-14 LAB — GLUCOSE, CAPILLARY
GLUCOSE-CAPILLARY: 151 mg/dL — AB (ref 65–99)
GLUCOSE-CAPILLARY: 158 mg/dL — AB (ref 65–99)
Glucose-Capillary: 201 mg/dL — ABNORMAL HIGH (ref 65–99)
Glucose-Capillary: 199 mg/dL — ABNORMAL HIGH (ref 65–99)

## 2016-12-14 LAB — CULTURE, BLOOD (ROUTINE X 2)
CULTURE: NO GROWTH
Culture: NO GROWTH
SPECIAL REQUESTS: ADEQUATE
SPECIAL REQUESTS: ADEQUATE

## 2016-12-14 LAB — CBC
HEMATOCRIT: 36.6 % — AB (ref 39.0–52.0)
HEMOGLOBIN: 12.1 g/dL — AB (ref 13.0–17.0)
MCH: 30.6 pg (ref 26.0–34.0)
MCHC: 33.1 g/dL (ref 30.0–36.0)
MCV: 92.4 fL (ref 78.0–100.0)
Platelets: 101 10*3/uL — ABNORMAL LOW (ref 150–400)
RBC: 3.96 MIL/uL — AB (ref 4.22–5.81)
RDW: 14.7 % (ref 11.5–15.5)
WBC: 2.6 10*3/uL — ABNORMAL LOW (ref 4.0–10.5)

## 2016-12-14 MED ORDER — AMLODIPINE BESYLATE 5 MG PO TABS
5.0000 mg | ORAL_TABLET | Freq: Every day | ORAL | Status: DC
  Administered 2016-12-14 – 2016-12-16 (×3): 5 mg via ORAL
  Filled 2016-12-14 (×3): qty 1

## 2016-12-14 MED ORDER — GUAIFENESIN ER 600 MG PO TB12
600.0000 mg | ORAL_TABLET | Freq: Two times a day (BID) | ORAL | Status: DC
  Administered 2016-12-14 – 2016-12-16 (×5): 600 mg via ORAL
  Filled 2016-12-14 (×5): qty 1

## 2016-12-14 MED ORDER — SODIUM CHLORIDE 0.9 % IV BOLUS (SEPSIS)
500.0000 mL | Freq: Once | INTRAVENOUS | Status: AC
  Administered 2016-12-14: 500 mL via INTRAVENOUS

## 2016-12-14 MED ORDER — SODIUM CHLORIDE 0.9% FLUSH: 10.0000 mL | Freq: Two times a day (BID) | INTRAVENOUS | Status: DC

## 2016-12-14 MED ORDER — SODIUM CHLORIDE 0.9% FLUSH
10.0000 mL | INTRAVENOUS | Status: DC | PRN
  Administered 2016-12-15 – 2016-12-16 (×2): 10 mL
  Filled 2016-12-14 (×2): qty 40

## 2016-12-14 NOTE — Progress Notes (Signed)
Rx Brief note: Pomalidomide Administrator)  By [employer] policy, Pomalidomide is automatically held when any of the following occurs:  Pomalidomide (Pomalyst) hold criteria:  ANC < 500  ANC < 1000 with fever 38.5C or above  Platelets < 25K  Bilirubin > 2 mg/dL  AST/ALT > 3x ULN  SCr > 3 mg/dL  Acute venous thromboembolic event  Active infection  Patient was admitted with acute bronchitis.  Pomalidomide has been held (discontinued from profile) per policy.  Thanks Dorrene German 12/14/2016 5:14 AM

## 2016-12-14 NOTE — Progress Notes (Signed)
Initial Nutrition Assessment  INTERVENTION:   Encourage PO intake Provided brief diet education regarding CHO modified diet  NUTRITION DIAGNOSIS:   Increased nutrient needs related to cancer and cancer related treatments (COPD) as evidenced by estimated needs.  GOAL:   Patient will meet greater than or equal to 90% of their needs  MONITOR:   PO intake, Supplement acceptance, Labs, Weight trends, I & O's  REASON FOR ASSESSMENT:   Consult Assessment of nutrition requirement/status  ASSESSMENT:   61 y.o. 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  Immunosuppressed with decadron 20mg  PO Q weekly on Sundays.  On PPx with acyclovir and 3x weekly bactrim.  Patient in room with family at bedside. Pt reports good appetite and eating well PTA. Pt has been consuming 100% of his meals since admission. Pt received lunch tray during visit. Emphasized protein foods with every meal and every snack. Pt trying to watch his carbohydrate intake, briefly provided diet education. Pt denies any nutrition impact symptoms at this time. Encouraged pt to drink protein supplements in between meals if pt has experienced any more weight loss.  Per chart review, pt has lost 8 lb since 4/06 (5% wt loss x 1 month, significant for time frame). Nutrition focused physical exam shows no sign of depletion of muscle mass or body fat.  Medications: Vitamin D tablet daily, Folic acid tablet daily, Multivitamin with minerals daily,  Labs reviewed: CBGs: 151-201  Diet Order:  Diet Carb Modified Fluid consistency: Thin; Room service appropriate? Yes  Skin:  Reviewed, no issues  Last BM:  5/2  Height:   Ht Readings from Last 1 Encounters:  12/13/16 5\' 6"  (1.676 m)    Weight:   Wt Readings from Last 1 Encounters:  12/13/16 160 lb 7.9 oz (72.8 kg)    Ideal Body  Weight:  64.5 kg  BMI:  Body mass index is 25.9 kg/m.  Estimated Nutritional Needs:   Kcal:  2150-2350  Protein:  105-115g  Fluid:  2.1L/day  EDUCATION NEEDS:   Education needs addressed  Clayton Bibles, MS, RD, LDN Pager: [phone] After Hours Pager: [phone]

## 2016-12-14 NOTE — Progress Notes (Signed)
Patient HR was sustaining in the 120's-130's at rest. BP 168/87. On call made aware. New orders were given for a one time 500cc bolus. Will continue to monitor.

## 2016-12-14 NOTE — Progress Notes (Signed)
Erik Nguyen   DOB:Mar 05, 1956   BZ#:[id number]     Multiple myeloma in remission ([facility])   08/28/2015 Bone Marrow Biopsy    Accession: [id number] biopsy showed 58% myeloma involvement. Cytogenetics was normal and FISH showed Del 13q and t(11;14).       09/07/2015 - 01/22/2016 Chemotherapy    he received 6 cycles of treatment of Velcade and Revlimid with Dex       09/12/2015 - 09/16/2015 Hospital Admission    He was admitted to the hospital with sepsis, treatment was placed on hold and he received one dose of GCSF for leukopenia      01/12/2016 Adverse Reaction    Delay treatment due to neutropenia      01/27/2016 Imaging    ECHO at [facility] showed normal EF      01/27/2016 PET scan    PET scan at [facility] showed innumerable lytic lesions throughout      01/27/2016 Procedure    PFT is within normal limits at [facility]      01/28/2016 Bone Marrow Biopsy    Bone marrow at [facility] showed persistent plasma cell myeloma in a normocellularmarrow (30%) with 20% atypical plasma cells and myeloid hyperplasia.      02/11/2016 - 06/10/2016 Chemotherapy    He is started on salvage Rx with Kyprolis, Cytoxan and dexamethasone      03/24/2016 Imaging    US venous Doppler showed no evidence of deep vein or superficial thrombosis involving the right lower extremity and left lower extremity. No evidence of Baker&'s cyst on the right or left.      05/18/2016 - 05/19/2016 Hospital Admission    He was admitted for chest pain evaluation. Cardiac work-up excluded cardiac events and CT angiogram were negative for PE. It showed esphageal thickening c/w reflux esophagitis likely cause for his chest discomfort      05/23/2016 - 05/25/2016 Hospital Admission    Patient was hospitalized for worsening shortness of breath and worsening pain. CT angiogram chest negative for pulmonary emboli. Patient had a recent normal 2-D echo and ruled out for MI. Patient noted to be dehydrated. Patient admitted placed on IV  fluids. Diuretic discontinued. Patient's gabapentin has been increased per recommendations from prior office visit from patient's oncologist to 600 mg 3 times daily      06/15/2016 Bone Marrow Biopsy    Bone marrow biopsy showed only 2% plasma cell, normal FISH and cytogenetics      07/26/2016 - 07/26/2016 Chemotherapy    He received high dose melphalan at [facility]       07/27/2016 Bone Marrow Transplant    He received autologous stem cell transplant at [facility]      08/05/2016 - 08/10/2016 Hospital Admission    He was admitted to [facility] for management of neutropenic fever and diarrhea. Cultures were negative. Stool culture came back positive for Novovirus and he was managed conservatively.      11/03/2016 Bone Marrow Biopsy    Bone Marrow Flow Cytometry - NO MONOCLONAL B CELL POPULATION IDENTIFIED. - PREDOMINANCE OF T CELLS WITH NON SPECIFIC CHANGES. - SEE NOTE. Diagnosis Comment: Analysis of the lymphoid population shows predominance of T lymphocytes expressing pan T-cell antigens but with relative abundance of CD8 positive cells and reversal of the CD4:CD8 ratio. No significant CD16/56 expression is identified. The T-cell changes are not considered specific in this setting. B cells represent the minor population with no monoclonality or abnormal phenotype. 4% lambda light chain restricted plasma  cells. FISH positive CCND1/IGH 12% of cells.       11/09/2016 PET scan    No FDG avid myeloma is identified. Numerous osseous lytic lesions as above. 2.Hypermetabolic left level III lymph node, likely reactive etiology.  3.Ancillary CT findings as above.      11/29/2016 -  Chemotherapy    He received treatment with daratumumab and Pomalyst        Subjective: I was informed that the patient is admitted to the hospital due to recurrent infection.  The patient was recently hospitalized at [facility] and was discharged The patient subsequently developed syncopal  episode and was admitted to [facility] hospital Since admission, he has been feeling better He has some mild nonproductive cough No further fever or chills  Objective:  Vitals:   12/14/16 1455 12/14/16 1459  BP: (!) 161/92 (!) 166/87  Pulse: (!) 123 (!) 125  Resp: (!) 21 (!) 21  Temp:       Intake/Output Summary (Last 24 hours) at 12/14/16 1507 Last data filed at 12/14/16 1500  Gross per 24 hour  Intake              725 ml  Output             1725 ml  Net            -1000 ml    GENERAL:alert, no distress and comfortable SKIN: skin color, texture, turgor are normal, no rashes or significant lesions EYES: normal, Conjunctiva are pink and non-injected, sclera clear OROPHARYNX:no exudate, no erythema and lips, buccal mucosa, and tongue normal  NECK: supple, thyroid normal size, non-tender, without nodularity LYMPH:  no palpable lymphadenopathy in the cervical, axillary or inguinal LUNGS: clear to auscultation and percussion with normal breathing effort HEART: regular rate & rhythm and no murmurs and no lower extremity edema ABDOMEN:abdomen soft, non-tender and normal bowel sounds Musculoskeletal:no cyanosis of digits and no clubbing  NEURO: alert & oriented x 3 with fluent speech, no focal motor/sensory deficits   Labs:  Lab Results  Component Value Date   WBC 2.6 (L) 12/14/2016   HGB 12.1 (L) 12/14/2016   HCT 36.6 (L) 12/14/2016   MCV 92.4 12/14/2016   PLT 101 (L) 12/14/2016   NEUTROABS 2.4 12/13/2016    Lab Results  Component Value Date   NA 139 12/14/2016   K 4.2 12/14/2016   CL 107 12/14/2016   CO2 20 (L) 12/14/2016    Studies:  Dg Chest 2 View  Result Date: 12/13/2016 CLINICAL DATA:  Cough for 1 week EXAM: CHEST  2 VIEW COMPARISON:  12/10/2016 FINDINGS: Cardiac shadow is within normal limits. Right-sided chest wall port is noted in satisfactory position. The lungs are well aerated bilaterally. No acute bony abnormality is seen. Stable lytic lesion is noted  within the right clavicle. IMPRESSION: No acute abnormality noted. Electronically Signed   By: Inez Catalina M.D.   On: 12/13/2016 17:22   Ct Angio Chest Pe W And/or Wo Contrast  Result Date: 12/13/2016 CLINICAL DATA:  Shortness of breath. Near syncope. Multiple myeloma. EXAM: CT ANGIOGRAPHY CHEST WITH CONTRAST TECHNIQUE: Multidetector CT imaging of the chest was performed using the standard protocol during bolus administration of intravenous contrast. Multiplanar CT image reconstructions and MIPs were obtained to evaluate the vascular anatomy. CONTRAST:  Reference EMR for dose.  Patient had 2 boluses. COMPARISON:  05/23/2016 FINDINGS: Cardiovascular: Satisfactory opacification of the pulmonary arteries to the segmental level, but sensitivity is randomly diminished  by respiratory motion. No evidence of pulmonary embolism. Normal heart size. No pericardial effusion. Coronary atherosclerosis. Mediastinum/Nodes: Negative for adenopathy. Porta catheter in good position, tip at the upper cavoatrial junction. Stable patulous appearance of the esophagus. Lungs/Pleura: Advanced diffuse airway thickening with mild atelectasis. No consolidation, edema, effusion, or pneumothorax. Upper Abdomen: No acute finding Musculoskeletal: Diffuse demineralization with numerous lytic lesions in this patient with known multiple myeloma. Pathologic fracture of the T9 body which is chronic; unchanged height loss. Review of the MIP images confirms the above findings. IMPRESSION: 1. No evidence of pulmonary embolism. Sensitivity is moderately degraded by respiratory motion. 2. Advanced bronchitic type airway thickening with mild atelectasis. 3. Multiple myeloma.  No acute osseous finding. 4. Coronary atherosclerosis. Electronically Signed   By: Monte Fantasia M.D.   On: 12/13/2016 20:46    Assessment & Plan:  Multiple myeloma status post transplant, disease relapse Continue aggressive supportive care His next chemotherapy would be due  on 12/20/2016  Pancytopenia Due to recent chemotherapy He does not need blood transfusion or G-CSF support  Recent sepsis/infection Cultures are pending Would defer to primary service for antibiotic coverage  Hyperglycemia Secondary to steroid treatment Recommend discontinue steroid treatment as this is not necessary at this point from the myeloma treatment standpoint  Hypertension Could be due to fluid retention from steroid treatment Monitor carefully and consider antihypertensives  CODE STATUS Full code  Discharge planning Hopefully within the next 2-3 days We will follow  Heath Lark, MD 12/14/2016  3:07 PM

## 2016-12-14 NOTE — Progress Notes (Signed)
  Echocardiogram 2D Echocardiogram has been performed.  Bobbye Charleston 12/14/2016, 4:02 PM

## 2016-12-14 NOTE — Progress Notes (Signed)
PROGRESS NOTE  Erik Nguyen KPV:[id number] DOB: [birth year] DOA: 12/13/2016 PCP: Arnoldo Morale, MD  HPI/Recap of past 24 hours:  Continued cough, less wheezing, denies chest pain, report feeling congested  no fever Dizziness and confusion seems has resolved, he does report difficulty remember things Wife at bedside  Assessment/Plan: Principal Problem:   Acute bronchitis Active Problems:   Multiple myeloma in remission ([place])   S/P autologous bone marrow transplantation ([place])   Blood glucose elevated  Early sepsis/Acute bronchitis in an immunosuppressed individual with Cough/fever/syncope at home and confusion on presentation:  he was hospitalized for this a few days ago at [facility] for the same, he is discharged , however, he did not  Get better he presented with early sepsis with sinsus tachycardia hr up to 138, tachypnea, rr up to 23, wbc  2.6, lactic acid 2.59, He has No hypoxia, no chest pain, cxr no acute findings Due to failed outpatient treatment and immunosupressed states, abx changed to cefepime on admission, continue duoneb/prednisone that is started on admission Will check for ldh and pcp smear , though cxr no acute infiltrate Blood culture no growth, sputum culture in process, ua no infection, respiratory viral panel pending collection  Syncope,  Wife report patient passed out briefly at home while using the bathroom, patient seems to have some confusion as well, he could not remember why he is sent to the hospital From dehydration, infection, basaovagal? Troponin negative, Will get echocardiogram, continue telemetry  Elevated blood sugar, fasting blood sugar range from 222-271 Likely steroids induced, ua + glucose On ssi here, a1c pending  HTN:  Continue home meds lisinopril, started on norvasc, will avoid betablocker due to h/o cocaine use per chart review  Multiple myeloma:  s/p autologous BMT in 07/2016 Last bone marrow, still has some  residual disease,   dexamethasone/daratumamab/pomalidomide, last dose on 4/24 He is  Continued on acyclovir and bactrium prophylaxis Oncology input appreciated   Code Status: Full  Family Communication: patient , wife and daughter at bedside  Disposition Plan: home in a few days   Consultants:  oncology  Procedures:  none  Antibiotics:  Cefepime from admission   Continue home prophyalxis dose of bactrium and acyclovir   Objective: BP 139/87 (BP Location: Right Arm)   Pulse (!) 102   Temp 98 F (36.7 C) (Oral)   Resp 19   Ht 5' 6"  (1.676 m)   Wt 72.8 kg (160 lb 7.9 oz)   SpO2 98%   BMI 25.90 kg/m   Intake/Output Summary (Last 24 hours) at 12/14/16 1245 Last data filed at 12/14/16 0829  Gross per 24 hour  Intake              485 ml  Output             1725 ml  Net            -1240 ml   Filed Weights   12/13/16 1557 12/13/16 2335  Weight: 75.3 kg (166 lb) 72.8 kg (160 lb 7.9 oz)    Exam:   General:  NAD  Cardiovascular: RRR  Respiratory: intermittent wheezing  Abdomen: Soft/ND/NT, positive BS  Musculoskeletal: No Edema  Neuro: aaox3  Data Reviewed: Basic Metabolic Panel:  Recent Labs Lab 12/09/16 1712 12/10/16 0626 12/13/16 1625 12/14/16 0546  NA 137 138 136 139  K 4.1 4.0 4.4 4.2  CL 105 108 104 107  CO2 25 24 23  20*  GLUCOSE 103* 115* 222* 271*  BUN  11 10 20 17   CREATININE 1.04 0.91 1.24 1.17  CALCIUM 8.9 8.4* 8.8* 8.7*   Liver Function Tests:  Recent Labs Lab 12/09/16 1712 12/13/16 1625  AST 20 22  ALT 26 30  ALKPHOS 44 46  BILITOT 0.5 0.5  PROT 6.2* 7.0  ALBUMIN 3.6 3.9   No results for input(s): LIPASE, AMYLASE in the last 168 hours. No results for input(s): AMMONIA in the last 168 hours. CBC:  Recent Labs Lab 12/09/16 1712 12/10/16 0626 12/13/16 1625 12/14/16 0546  WBC 7.0 5.9 2.8* 2.6*  NEUTROABS 5.4  --  2.4  --   HGB 13.3 13.0 13.2 12.1*  HCT 38.3* 37.8* 39.5 36.6*  MCV 91.2 93.3 92.7 92.4  PLT 144*  126* 108* 101*   Cardiac Enzymes:   No results for input(s): CKTOTAL, CKMB, CKMBINDEX, TROPONINI in the last 168 hours. BNP (last 3 results)  Recent Labs  02/09/16 0854  BNP 3.1    ProBNP (last 3 results) No results for input(s): PROBNP in the last 8760 hours.  CBG:  Recent Labs Lab 12/13/16 2335 12/14/16 0728 12/14/16 1151  GLUCAP 267* 201* 151*    Recent Results (from the past 240 hour(s))  Blood culture (routine x 2)     Status: None   Collection Time: 12/09/16  5:13 PM  Result Value Ref Range Status   Specimen Description BLOOD RIGHT PORT  Final   Special Requests   Final    BOTTLES DRAWN AEROBIC AND ANAEROBIC Blood Culture adequate volume   Culture NO GROWTH 5 DAYS  Final   Report Status 12/14/2016 FINAL  Final  Urine culture     Status: None   Collection Time: 12/09/16  5:14 PM  Result Value Ref Range Status   Specimen Description URINE, RANDOM  Final   Special Requests NONE  Final   Culture   Final    NO GROWTH Performed at [facility], [place] [address], [place], [place] [address]    Report Status 12/10/2016 FINAL  Final  MRSA PCR Screening     Status: None   Collection Time: 12/09/16  6:56 PM  Result Value Ref Range Status   MRSA by PCR NEGATIVE NEGATIVE Final    Comment:        The GeneXpert MRSA Assay (FDA approved for NASAL specimens only), is one component of a comprehensive MRSA colonization surveillance program. It is not intended to diagnose MRSA infection nor to guide or monitor treatment for MRSA infections.   Blood culture (routine x 2)     Status: None   Collection Time: 12/09/16  8:21 PM  Result Value Ref Range Status   Specimen Description BLOOD LEFT ARM  Final   Special Requests   Final    BOTTLES DRAWN AEROBIC AND ANAEROBIC Blood Culture adequate volume   Culture NO GROWTH 5 DAYS  Final   Report Status 12/14/2016 FINAL  Final  Culture, blood (Routine x 2)     Status: None (Preliminary result)   Collection Time: 12/13/16   4:30 PM  Result Value Ref Range Status   Specimen Description BLOOD PORTA CATH  Final   Special Requests   Final    BOTTLES DRAWN AEROBIC AND ANAEROBIC Blood Culture adequate volume   Culture   Final    NO GROWTH < 24 HOURS Performed at [facility], [place] [address], [place], [place] [address]    Report Status PENDING  Incomplete  Culture, blood (Routine x 2)  Status: None (Preliminary result)   Collection Time: 12/13/16  4:42 PM  Result Value Ref Range Status   Specimen Description BLOOD BLOOD LEFT HAND  Final   Special Requests   Final    BOTTLES DRAWN AEROBIC AND ANAEROBIC Blood Culture adequate volume   Culture   Final    NO GROWTH < 24 HOURS Performed at [facility], [address] [place], [place] [address]    Report Status PENDING  Incomplete     Studies: Dg Chest 2 View  Result Date: 12/13/2016 CLINICAL DATA:  Cough for 1 week EXAM: CHEST  2 VIEW COMPARISON:  12/10/2016 FINDINGS: Cardiac shadow is within normal limits. Right-sided chest wall port is noted in satisfactory position. The lungs are well aerated bilaterally. No acute bony abnormality is seen. Stable lytic lesion is noted within the right clavicle. IMPRESSION: No acute abnormality noted. Electronically Signed   By: Inez Catalina M.D.   On: 12/13/2016 17:22   Ct Angio Chest Pe W And/or Wo Contrast  Result Date: 12/13/2016 CLINICAL DATA:  Shortness of breath. Near syncope. Multiple myeloma. EXAM: CT ANGIOGRAPHY CHEST WITH CONTRAST TECHNIQUE: Multidetector CT imaging of the chest was performed using the standard protocol during bolus administration of intravenous contrast. Multiplanar CT image reconstructions and MIPs were obtained to evaluate the vascular anatomy. CONTRAST:  Reference EMR for dose.  Patient had 2 boluses. COMPARISON:  05/23/2016 FINDINGS: Cardiovascular: Satisfactory opacification of the pulmonary arteries to the segmental level, but sensitivity is randomly diminished by respiratory motion.  No evidence of pulmonary embolism. Normal heart size. No pericardial effusion. Coronary atherosclerosis. Mediastinum/Nodes: Negative for adenopathy. Porta catheter in good position, tip at the upper cavoatrial junction. Stable patulous appearance of the esophagus. Lungs/Pleura: Advanced diffuse airway thickening with mild atelectasis. No consolidation, edema, effusion, or pneumothorax. Upper Abdomen: No acute finding Musculoskeletal: Diffuse demineralization with numerous lytic lesions in this patient with known multiple myeloma. Pathologic fracture of the T9 body which is chronic; unchanged height loss. Review of the MIP images confirms the above findings. IMPRESSION: 1. No evidence of pulmonary embolism. Sensitivity is moderately degraded by respiratory motion. 2. Advanced bronchitic type airway thickening with mild atelectasis. 3. Multiple myeloma.  No acute osseous finding. 4. Coronary atherosclerosis. Electronically Signed   By: Monte Fantasia M.D.   On: 12/13/2016 20:46    Scheduled Meds: . acyclovir  800 mg Oral BID  . aspirin  81 mg Oral Daily  . cholecalciferol  1,000 Units Oral Daily  . [START ON 12/18/2016] dexamethasone  20 mg Oral Weekly  . enoxaparin (LOVENOX) injection  40 mg Subcutaneous QHS  . famotidine  20 mg Oral Daily  . folic acid  1 mg Oral Daily  . gabapentin  300 mg Oral Daily  . gabapentin  600 mg Oral QHS  . insulin aspart  0-15 Units Subcutaneous TID WC  . ipratropium-albuterol  3 mL Nebulization TID  . lisinopril  20 mg Oral Daily  . multivitamin with minerals  1 tablet Oral Daily  . pantoprazole  40 mg Oral Daily  . predniSONE  50 mg Oral Q breakfast  . sodium chloride flush  10-40 mL Intracatheter Q12H  . sodium chloride flush  3 mL Intravenous Q12H  . sulfamethoxazole-trimethoprim  1 tablet Oral Once per day on Mon Wed Fri    Continuous Infusions: . ceFEPime (MAXIPIME) IV Stopped (12/14/16 0546)     Time spent: 55mns  Tavian Callander MD, PhD  [facility] Pager [phone] If 7PM-7AM, please  contact night-coverage at [email or web address], password [facility] 12/14/2016, 12:45 PM  LOS: 1 day

## 2016-12-14 NOTE — Progress Notes (Signed)
Inpatient Diabetes Program Recommendations  AACE/ADA: New Consensus Statement on Inpatient Glycemic Control (2015)  Target Ranges:  Prepandial:   less than 140 mg/dL      Peak postprandial:   less than 180 mg/dL (1-2 hours)      Critically ill patients:  140 - 180 mg/dL   Results for Erik Nguyen, Erik Nguyen (MRN [id number]) as of 12/14/2016 09:48  Ref. Range 12/13/2016 23:35 12/14/2016 07:28  Glucose-Capillary Latest Ref Range: 65 - 99 mg/dL 267 (H) 201 (H)   Review of Glycemic Control  Diabetes history: None Current orders for Inpatient glycemic control: Novolog Moderate Correction scale tid  Inpatient Diabetes Program Recommendations:    Steroid induced hyperglycemia consider getting an A1c to determine glucose levels from the past 2-3 months. Patient chronically on steroids may need glucose control outpatient.  125 mg IV Solumedrol given x1 dose now on PO prednisone 50 mg Daily. Novolog Correction started this am. Will follow trends  Thanks,  Tama Headings RN, MSN, [facility] Inpatient Diabetes Coordinator Team Pager [phone] (8a-5p)

## 2016-12-15 ENCOUNTER — Inpatient Hospital Stay: Payer: Medicaid Other

## 2016-12-15 DIAGNOSIS — R0989 Other specified symptoms and signs involving the circulatory and respiratory systems: Secondary | ICD-10-CM

## 2016-12-15 DIAGNOSIS — J208 Acute bronchitis due to other specified organisms: Secondary | ICD-10-CM

## 2016-12-15 LAB — CBC WITH DIFFERENTIAL/PLATELET
Basophils Absolute: 0 10*3/uL (ref 0.0–0.1)
Basophils Relative: 0 %
EOS ABS: 0 10*3/uL (ref 0.0–0.7)
EOS PCT: 0 %
HCT: 34.3 % — ABNORMAL LOW (ref 39.0–52.0)
Hemoglobin: 12 g/dL — ABNORMAL LOW (ref 13.0–17.0)
LYMPHS ABS: 0.9 10*3/uL (ref 0.7–4.0)
LYMPHS PCT: 24 %
MCH: 31.7 pg (ref 26.0–34.0)
MCHC: 35 g/dL (ref 30.0–36.0)
MCV: 90.5 fL (ref 78.0–100.0)
MONO ABS: 0.4 10*3/uL (ref 0.1–1.0)
MONOS PCT: 12 %
Neutro Abs: 2.3 10*3/uL (ref 1.7–7.7)
Neutrophils Relative %: 63 %
PLATELETS: 111 10*3/uL — AB (ref 150–400)
RBC: 3.79 MIL/uL — ABNORMAL LOW (ref 4.22–5.81)
RDW: 15 % (ref 11.5–15.5)
WBC: 3.6 10*3/uL — ABNORMAL LOW (ref 4.0–10.5)

## 2016-12-15 LAB — GLUCOSE, CAPILLARY
GLUCOSE-CAPILLARY: 88 mg/dL (ref 65–99)
GLUCOSE-CAPILLARY: 103 mg/dL — AB (ref 65–99)
Glucose-Capillary: 93 mg/dL (ref 65–99)
Glucose-Capillary: 92 mg/dL (ref 65–99)

## 2016-12-15 LAB — BASIC METABOLIC PANEL
Anion gap: 8 (ref 5–15)
BUN: 17 mg/dL (ref 6–20)
CO2: 23 mmol/L (ref 22–32)
CREATININE: 0.95 mg/dL (ref 0.61–1.24)
Calcium: 8.6 mg/dL — ABNORMAL LOW (ref 8.9–10.3)
Chloride: 109 mmol/L (ref 101–111)
GFR calc Af Amer: >60 mL/min (ref >60)
GLUCOSE: 118 mg/dL — AB (ref 65–99)
Potassium: 3.7 mmol/L (ref 3.5–5.1)
SODIUM: 140 mmol/L (ref 135–145)

## 2016-12-15 LAB — RESPIRATORY PANEL BY PCR
ADENOVIRUS-RVPPCR: NOT DETECTED
BORDETELLA PERTUSSIS-RVPCR: NOT DETECTED
CORONAVIRUS HKU1-RVPPCR: NOT DETECTED
CORONAVIRUS NL63-RVPPCR: NOT DETECTED
CORONAVIRUS OC43-RVPPCR: NOT DETECTED
Chlamydophila pneumoniae: NOT DETECTED
Coronavirus 229E: NOT DETECTED
Influenza A: NOT DETECTED
Influenza B: NOT DETECTED
METAPNEUMOVIRUS-RVPPCR: DETECTED — AB
Mycoplasma pneumoniae: NOT DETECTED
PARAINFLUENZA VIRUS 1-RVPPCR: NOT DETECTED
PARAINFLUENZA VIRUS 2-RVPPCR: NOT DETECTED
PARAINFLUENZA VIRUS 3-RVPPCR: NOT DETECTED
Parainfluenza Virus 4: NOT DETECTED
RHINOVIRUS / ENTEROVIRUS - RVPPCR: NOT DETECTED
Respiratory Syncytial Virus: NOT DETECTED

## 2016-12-15 LAB — LACTATE DEHYDROGENASE: LDH: 142 U/L (ref 98–192)

## 2016-12-15 LAB — LACTIC ACID, PLASMA: LACTIC ACID, VENOUS: 1.8 mmol/L (ref 0.5–1.9)

## 2016-12-15 NOTE — Progress Notes (Signed)
Patient ambulated 772 feet in hallway with standby assistance. Tolerated well.

## 2016-12-15 NOTE — Progress Notes (Signed)
Erik Nguyen   DOB:02/29/1956   XT#:[id number]    Subjective: The patient continues to have significant coughing.  He denies chest pain.  No reported fever or chills  Objective:  Vitals:   12/15/16 0825 12/15/16 0826  BP: (!) 140/102 (!) 138/96  Pulse:  (!) 105  Resp:    Temp:       Intake/Output Summary (Last 24 hours) at 12/15/16 0846 Last data filed at 12/15/16 0655  Gross per 24 hour  Intake             1810 ml  Output             2075 ml  Net             -265 ml    GENERAL:alert, no distress and comfortable SKIN: skin color, texture, turgor are normal, no rashes or significant lesions EYES: normal, Conjunctiva are pink and non-injected, sclera clear OROPHARYNX:no exudate, no erythema and lips, buccal mucosa, and tongue normal  NECK: supple, thyroid normal size, non-tender, without nodularity LYMPH:  no palpable lymphadenopathy in the cervical, axillary or inguinal LUNGS: Normal breathing effort.  Bibasilar crackles HEART: regular rate & rhythm and no murmurs and no lower extremity edema ABDOMEN:abdomen soft, non-tender and normal bowel sounds Musculoskeletal:no cyanosis of digits and no clubbing  NEURO: alert & oriented x 3 with fluent speech, no focal motor/sensory deficits   Labs:  Lab Results  Component Value Date   WBC 3.6 (L) 12/15/2016   HGB 12.0 (L) 12/15/2016   HCT 34.3 (L) 12/15/2016   MCV 90.5 12/15/2016   PLT 111 (L) 12/15/2016   NEUTROABS 2.3 12/15/2016    Lab Results  Component Value Date   NA 140 12/15/2016   K 3.7 12/15/2016   CL 109 12/15/2016   CO2 23 12/15/2016    Studies:  Dg Chest 2 View  Result Date: 12/13/2016 CLINICAL DATA:  Cough for 1 week EXAM: CHEST  2 VIEW COMPARISON:  12/10/2016 FINDINGS: Cardiac shadow is within normal limits. Right-sided chest wall port is noted in satisfactory position. The lungs are well aerated bilaterally. No acute bony abnormality is seen. Stable lytic lesion is noted within the right clavicle. IMPRESSION:  No acute abnormality noted. Electronically Signed   By: Inez Catalina M.D.   On: 12/13/2016 17:22   Ct Angio Chest Pe W And/or Wo Contrast  Result Date: 12/13/2016 CLINICAL DATA:  Shortness of breath. Near syncope. Multiple myeloma. EXAM: CT ANGIOGRAPHY CHEST WITH CONTRAST TECHNIQUE: Multidetector CT imaging of the chest was performed using the standard protocol during bolus administration of intravenous contrast. Multiplanar CT image reconstructions and MIPs were obtained to evaluate the vascular anatomy. CONTRAST:  Reference EMR for dose.  Patient had 2 boluses. COMPARISON:  05/23/2016 FINDINGS: Cardiovascular: Satisfactory opacification of the pulmonary arteries to the segmental level, but sensitivity is randomly diminished by respiratory motion. No evidence of pulmonary embolism. Normal heart size. No pericardial effusion. Coronary atherosclerosis. Mediastinum/Nodes: Negative for adenopathy. Porta catheter in good position, tip at the upper cavoatrial junction. Stable patulous appearance of the esophagus. Lungs/Pleura: Advanced diffuse airway thickening with mild atelectasis. No consolidation, edema, effusion, or pneumothorax. Upper Abdomen: No acute finding Musculoskeletal: Diffuse demineralization with numerous lytic lesions in this patient with known multiple myeloma. Pathologic fracture of the T9 body which is chronic; unchanged height loss. Review of the MIP images confirms the above findings. IMPRESSION: 1. No evidence of pulmonary embolism. Sensitivity is moderately degraded by respiratory motion. 2. Advanced bronchitic type airway thickening  with mild atelectasis. 3. Multiple myeloma.  No acute osseous finding. 4. Coronary atherosclerosis. Electronically Signed   By: Monte Fantasia M.D.   On: 12/13/2016 20:46    Assessment & Plan:   Multiple myeloma status post transplant, disease relapse Continue aggressive supportive care His next chemotherapy would be due on 12/20/2016  Pancytopenia Due to  recent chemotherapy He does not need blood transfusion or G-CSF support  Recent sepsis/infection Cultures are pending Would defer to primary service for antibiotic coverage  Hyperglycemia, improved Secondary to steroid treatment Recommend discontinue steroid treatment as this is not necessary at this point from the myeloma treatment standpoint  Cough, lung crackles Could be due to infection Incentive spirometry  Hypertension Could be due to fluid retention from steroid treatment Monitor carefully and consider antihypertensives  CODE STATUS Full code  Discharge planning Hopefully soon, defer to primary service We will follow Heath Lark, MD 12/15/2016  8:46 AM

## 2016-12-15 NOTE — Progress Notes (Signed)
PROGRESS NOTE  Erik Nguyen JJK:[id number] DOB: Jan 07, 1956 DOA: 12/13/2016 PCP: Arnoldo Morale, MD  HPI/Recap of past 24 hours:  Feeling better, less cough, no wheezing, denies chest pain,   no fever Dizziness and confusion seems has resolved, he does report difficulty remember things   Assessment/Plan: Principal Problem:   Acute bronchitis Active Problems:   Multiple myeloma in remission ([place])   S/P autologous bone marrow transplantation ([place])   Blood glucose elevated  Early sepsis/Acute bronchitis in an immunosuppressed individual with Cough/fever/syncope at home and confusion on presentation:  he was hospitalized for this a few days ago at [facility] for the same, he is discharged , however, he did not  Get better he presented with early sepsis with sinsus tachycardia hr up to 138, tachypnea, rr up to 23, wbc  2.6, lactic acid 2.59, He has No hypoxia, no chest pain, cxr no acute findings Due to failed outpatient treatment and immunosupressed states, abx changed to cefepime on admission, continue duoneb/prednisone that is started on admission cxr no acute infiltrate, ldh  wnl which is reassuring, pcp smear  pending,  Blood culture no growth, sputum culture in process, ua no infection, respiratory viral panel + metapenumovirus  Syncope,  Wife report patient passed out briefly at home while using the bathroom, patient seems to have some confusion as well, he could not remember why he is sent to the hospital From dehydration, infection, basaovagal? Troponin negative, Will get echocardiogram, continue telemetry  Elevated blood sugar, fasting blood sugar range from 222-271 Likely steroids induced, ua + glucose On ssi here, a1c pending  HTN:  Continue home meds lisinopril, started on norvasc, will avoid betablocker due to h/o cocaine use per chart review  Multiple myeloma:  s/p autologous BMT in 07/2016 Last bone marrow, still has some residual disease,   dexamethasone/daratumamab/pomalidomide, last dose on 4/24 He is  Continued on acyclovir and bactrium prophylaxis Oncology input appreciated   Code Status: Full  Family Communication: patient , wife and daughter at bedside  Disposition Plan: home in 1-2 days   Consultants:  oncology  Procedures:  none  Antibiotics:  Cefepime from admission   Continue home prophyalxis dose of bactrium and acyclovir   Objective: BP (!) 138/96 (BP Location: Right Arm)   Pulse (!) 105   Temp 98.7 F (37.1 C) (Oral)   Resp (!) 24   Ht 5' 6"  (1.676 m)   Wt 72.8 kg (160 lb 7.9 oz)   SpO2 95%   BMI 25.90 kg/m   Intake/Output Summary (Last 24 hours) at 12/15/16 0906 Last data filed at 12/15/16 0655  Gross per 24 hour  Intake             1810 ml  Output             2075 ml  Net             -265 ml   Filed Weights   12/13/16 1557 12/13/16 2335  Weight: 75.3 kg (166 lb) 72.8 kg (160 lb 7.9 oz)    Exam:   General:  NAD  Cardiovascular: RRR  Respiratory: intermittent wheezing seems has resolved. Some mild crackles, no rhonchi  Abdomen: Soft/ND/NT, positive BS  Musculoskeletal: No Edema  Neuro: aaox3  Data Reviewed: Basic Metabolic Panel:  Recent Labs Lab 12/09/16 1712 12/10/16 0626 12/13/16 1625 12/14/16 0546 12/15/16 0428  NA 137 138 136 139 140  K 4.1 4.0 4.4 4.2 3.7  CL 105 108 104 107 109  CO2 25 24 23  20* 23  GLUCOSE 103* 115* 222* 271* 118*  BUN 11 10 20 17 17   CREATININE 1.04 0.91 1.24 1.17 0.95  CALCIUM 8.9 8.4* 8.8* 8.7* 8.6*   Liver Function Tests:  Recent Labs Lab 12/09/16 1712 12/13/16 1625  AST 20 22  ALT 26 30  ALKPHOS 44 46  BILITOT 0.5 0.5  PROT 6.2* 7.0  ALBUMIN 3.6 3.9   No results for input(s): LIPASE, AMYLASE in the last 168 hours. No results for input(s): AMMONIA in the last 168 hours. CBC:  Recent Labs Lab 12/09/16 1712 12/10/16 0626 12/13/16 1625 12/14/16 0546 12/15/16 0428  WBC 7.0 5.9 2.8* 2.6* 3.6*  NEUTROABS 5.4   --  2.4  --  2.3  HGB 13.3 13.0 13.2 12.1* 12.0*  HCT 38.3* 37.8* 39.5 36.6* 34.3*  MCV 91.2 93.3 92.7 92.4 90.5  PLT 144* 126* 108* 101* 111*   Cardiac Enzymes:   No results for input(s): CKTOTAL, CKMB, CKMBINDEX, TROPONINI in the last 168 hours. BNP (last 3 results)  Recent Labs  02/09/16 0854  BNP 3.1    ProBNP (last 3 results) No results for input(s): PROBNP in the last 8760 hours.  CBG:  Recent Labs Lab 12/14/16 0728 12/14/16 1151 12/14/16 1645 12/14/16 2155 12/15/16 0745  GLUCAP 201* 151* 199* 158* 93    Recent Results (from the past 240 hour(s))  Blood culture (routine x 2)     Status: None   Collection Time: 12/09/16  5:13 PM  Result Value Ref Range Status   Specimen Description BLOOD RIGHT PORT  Final   Special Requests   Final    BOTTLES DRAWN AEROBIC AND ANAEROBIC Blood Culture adequate volume   Culture NO GROWTH 5 DAYS  Final   Report Status 12/14/2016 FINAL  Final  Urine culture     Status: None   Collection Time: 12/09/16  5:14 PM  Result Value Ref Range Status   Specimen Description URINE, RANDOM  Final   Special Requests NONE  Final   Culture   Final    NO GROWTH Performed at [facility], [place] [address] [place], [place] [address]    Report Status 12/10/2016 FINAL  Final  MRSA PCR Screening     Status: None   Collection Time: 12/09/16  6:56 PM  Result Value Ref Range Status   MRSA by PCR NEGATIVE NEGATIVE Final    Comment:        The GeneXpert MRSA Assay (FDA approved for NASAL specimens only), is one component of a comprehensive MRSA colonization surveillance program. It is not intended to diagnose MRSA infection nor to guide or monitor treatment for MRSA infections.   Blood culture (routine x 2)     Status: None   Collection Time: 12/09/16  8:21 PM  Result Value Ref Range Status   Specimen Description BLOOD LEFT ARM  Final   Special Requests   Final    BOTTLES DRAWN AEROBIC AND ANAEROBIC Blood Culture adequate volume    Culture NO GROWTH 5 DAYS  Final   Report Status 12/14/2016 FINAL  Final  Culture, blood (Routine x 2)     Status: None (Preliminary result)   Collection Time: 12/13/16  4:30 PM  Result Value Ref Range Status   Specimen Description BLOOD PORTA CATH  Final   Special Requests   Final    BOTTLES DRAWN AEROBIC AND ANAEROBIC Blood Culture adequate volume   Culture   Final    NO GROWTH  2 DAYS Performed at [facility], [place] [address] [place], [place] [address]    Report Status PENDING  Incomplete  Culture, blood (Routine x 2)     Status: None (Preliminary result)   Collection Time: 12/13/16  4:42 PM  Result Value Ref Range Status   Specimen Description BLOOD BLOOD LEFT HAND  Final   Special Requests   Final    BOTTLES DRAWN AEROBIC AND ANAEROBIC Blood Culture adequate volume   Culture   Final    NO GROWTH 2 DAYS Performed at [facility], [place] [address], [place], [place] [address]    Report Status PENDING  Incomplete  Culture, expectorated sputum-assessment     Status: None   Collection Time: 12/14/16 12:43 PM  Result Value Ref Range Status   Specimen Description SPU  Final   Special Requests NONE  Final   Sputum evaluation THIS SPECIMEN IS ACCEPTABLE FOR SPUTUM CULTURE  Final   Report Status 12/14/2016 FINAL  Final  Culture, respiratory (NON-Expectorated)     Status: None (Preliminary result)   Collection Time: 12/14/16 12:43 PM  Result Value Ref Range Status   Specimen Description SPU  Final   Special Requests NONE Reflexed from [id number]  Final   Gram Stain   Final    ABUNDANT WBC PRESENT,BOTH PMN AND MONONUCLEAR ABUNDANT GRAM NEGATIVE RODS FEW GRAM POSITIVE COCCI IN PAIRS FEW GRAM POSITIVE RODS RARE GRAM NEGATIVE COCCI IN PAIRS Performed at [facility], [place] [address], [place], [place] [address]    Culture PENDING  Incomplete   Report Status PENDING  Incomplete     Studies: No results found.  Scheduled Meds: . acyclovir  800 mg Oral BID  . amLODipine   5 mg Oral Daily  . aspirin  81 mg Oral Daily  . cholecalciferol  1,000 Units Oral Daily  . enoxaparin (LOVENOX) injection  40 mg Subcutaneous QHS  . famotidine  20 mg Oral Daily  . folic acid  1 mg Oral Daily  . gabapentin  300 mg Oral Daily  . gabapentin  600 mg Oral QHS  . guaiFENesin  600 mg Oral BID  . insulin aspart  0-15 Units Subcutaneous TID WC  . lisinopril  20 mg Oral Daily  . multivitamin with minerals  1 tablet Oral Daily  . pantoprazole  40 mg Oral Daily  . sodium chloride flush  10-40 mL Intracatheter Q12H  . sodium chloride flush  3 mL Intravenous Q12H  . sulfamethoxazole-trimethoprim  1 tablet Oral Once per day on Mon Wed Fri    Continuous Infusions: . ceFEPime (MAXIPIME) IV Stopped (12/15/16 0539)     Time spent: 56mns  Dwain Huhn MD, PhD  [facility] Pager [phone] If 7PM-7AM, please contact night-coverage at [email or web address], password [email or web address]5/10/2016, 9:06 AM  LOS: 2 days

## 2016-12-16 ENCOUNTER — Inpatient Hospital Stay (HOSPITAL_COMMUNITY): Payer: Medicaid Other

## 2016-12-16 LAB — CULTURE, RESPIRATORY W GRAM STAIN: Culture: NORMAL

## 2016-12-16 LAB — CBC
HEMATOCRIT: 36.6 % — AB (ref 39.0–52.0)
Hemoglobin: 12.7 g/dL — ABNORMAL LOW (ref 13.0–17.0)
MCH: 31.1 pg (ref 26.0–34.0)
MCHC: 34.7 g/dL (ref 30.0–36.0)
MCV: 89.7 fL (ref 78.0–100.0)
PLATELETS: 124 10*3/uL — AB (ref 150–400)
RBC: 4.08 MIL/uL — ABNORMAL LOW (ref 4.22–5.81)
RDW: 14.7 % (ref 11.5–15.5)
WBC: 2.4 10*3/uL — AB (ref 4.0–10.5)

## 2016-12-16 LAB — BASIC METABOLIC PANEL
ANION GAP: 9 (ref 5–15)
BUN: 20 mg/dL (ref 6–20)
CALCIUM: 8.5 mg/dL — AB (ref 8.9–10.3)
CO2: 24 mmol/L (ref 22–32)
Chloride: 102 mmol/L (ref 101–111)
Creatinine, Ser: 1.03 mg/dL (ref 0.61–1.24)
GFR calc Af Amer: >60 mL/min (ref >60)
GFR calc non Af Amer: >60 mL/min (ref >60)
Glucose, Bld: 94 mg/dL (ref 65–99)
Potassium: 3.6 mmol/L (ref 3.5–5.1)
Sodium: 135 mmol/L (ref 135–145)

## 2016-12-16 LAB — TSH: TSH: 1.403 u[IU]/mL (ref 0.350–4.500)

## 2016-12-16 LAB — HEMOGLOBIN A1C
HEMOGLOBIN A1C: 6 % — AB (ref 4.8–5.6)
Mean Plasma Glucose: 126 mg/dL

## 2016-12-16 LAB — GLUCOSE, CAPILLARY
GLUCOSE-CAPILLARY: 98 mg/dL (ref 65–99)
Glucose-Capillary: 101 mg/dL — ABNORMAL HIGH (ref 65–99)

## 2016-12-16 LAB — MAGNESIUM: Magnesium: 1.8 mg/dL (ref 1.7–2.4)

## 2016-12-16 MED ORDER — AMOXICILLIN-POT CLAVULANATE 875-125 MG PO TABS: 1.0000 | ORAL_TABLET | Freq: Two times a day (BID) | ORAL | 0 refills | Status: DC

## 2016-12-16 MED ORDER — AMOXICILLIN-POT CLAVULANATE 875-125 MG PO TABS: 1.0000 | ORAL_TABLET | Freq: Two times a day (BID) | ORAL | 0 refills | Status: AC

## 2016-12-16 MED ORDER — POTASSIUM CHLORIDE CRYS ER 20 MEQ PO TBCR
40.0000 meq | EXTENDED_RELEASE_TABLET | Freq: Once | ORAL | Status: AC
  Administered 2016-12-16: 40 meq via ORAL
  Filled 2016-12-16: qty 2

## 2016-12-16 MED ORDER — HEPARIN SOD (PORK) LOCK FLUSH 100 UNIT/ML IV SOLN
500.0000 [IU] | INTRAVENOUS | Status: AC | PRN
  Administered 2016-12-16: 500 [IU]

## 2016-12-16 NOTE — Discharge Summary (Signed)
Discharge Summary  Erik Nguyen JAS:[id number] DOB: [birth year]  PCP: Arnoldo Morale, MD  Admit date: 12/13/2016 Discharge date: 12/16/2016  Time spent: <70mns  Recommendations for Outpatient Follow-up:  1. F/u with PMD within a week  for hospital discharge follow up, repeat cbc/bmp at follow up 2. f/u with oncology on 5/8  Discharge Diagnoses:  Active Hospital Problems   Diagnosis Date Noted  . Acute bronchitis 12/13/2016  . Blood glucose elevated 12/13/2016  . [facility] [facility] [place]  . Multiple myeloma in remission ([facility] 09/01/2015    Resolved Hospital Problems   Diagnosis Date Noted Date Resolved  No resolved problems to display.    Discharge Condition: stable  Diet recommendation: heart healthy/carb modified  Filed Weights   12/13/16 1557 12/13/16 2335  Weight: 75.3 kg (166 lb) 72.8 kg (160 lb 7.9 oz)    History of present illness:  PCP: EArnoldo Morale MD  Patient coming from: Home  I have personally briefly reviewed patient's [facility] [place] Chief Complaint: SOB, cough  HPI: Erik Nittais a 61y.o. 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  Immunosuppressed with decadron 279mPO Q weekly on Sundays.  On PPx with acyclovir and 3x weekly bactrim.  [facility], [facility], fever of 100.8 at home for 3 days prior to that point.  He was initially treated with zosyn / vanc and steroids, started feeling better, and was discharged on PO prednisone and levaquin.  CXR was neg for PNA.  BCx negative.  Since then his cough has been persistent, he has been running low grade fevers of Tm ~100 at home.  He presents to ED with worsening SOB today.   ED Course: SOB and wheezing improved with solumedrol and neb  treatments.  Still significant tachycardia.  Initial lactate negative, repeat is slightly elevated.  WBC 2.8 with ANC 2.4k.  CT chest just shows bronchitis.   Hospital Course:  Principal Problem:   Acute bronchitis Active Problems:   [facility] [place]  [facility] [place]  Blood glucose elevated   Early sepsis/Acute bronchitis in an immunosuppressed individual with Cough/fever/syncope at home and confusion on presentation:  [facility] he is discharged , however, he did not  Get better he presented with early sepsis with sinsus tachycardia hr up to 138, tachypnea, rr up to 23, wbc  2.6, lactic acid 2.59, He has No hypoxia, no chest pain, cxr no acute findings Due to failed outpatient treatment and immunosupressed states, abx changed to cefepime on admission, continue duoneb/prednisone that is started on admission cxr no acute infiltrate, ldh  wnl which is reassuring,  Blood culture no growth, sputum culture with normal flora, ua no infection, respiratory viral panel + metapenumovirus Repeat cxr on 5/4 with "Persistent mild bronchiectasis with central predominance. No evidence of focal airspace consolidation." Clinically he has improved, he want to go home, will discharge home on augmentin for another 4days to finish 7days abx treatment, close follow up with pmd and oncology.  Syncope,  Wife report patient passed out briefly at home while using the bathroom, patient seems to have some confusion as well, he could not remember why he is sent to the  hospital From dehydration, infection, basaovagal? Troponin negative, echocardiogram with moderate concentric hypertrophy, lvef 65-70%, ekg sinus rhythm, Telemetry unremarkable pmd follow up  Elevated blood sugar, fasting blood sugar range from 222-271 Likely steroids induced, ua + glucose On ssi here, a1c 6. pmd to  continue monitor blood sugar  HTN:  Continue home meds lisinopril, started on norvasc, will avoid betablocker due to h/o cocaine use per chart review bp stable at discharge, he is discharged on lisinopril only.  Multiple myeloma:  s/p autologous BMT in 07/2016 Last bone marrow, still has some residual disease,   dexamethasone/daratumamab/pomalidomide, last dose on 4/24 He is  Continued on acyclovir and bactrium prophylaxis Oncology input appreciated, he is to follow up with oncology on 5/8   Code Status: Full  Family Communication: patient , wife  at bedside  Disposition Plan: home    Consultants:  oncology  Procedures:  none  Antibiotics:  Cefepime from admission   Continue home prophyalxis dose of bactrium and acyclovir   Discharge Exam: BP 108/74 (BP Location: Left Arm)   Pulse 89   Temp 97.9 F (36.6 C) (Oral)   Resp 18   Ht _0  (1.676 m)   Wt 72.8 kg (160 lb 7.9 oz)   SpO2 99%   BMI 25.90 kg/m     General:  NAD  Cardiovascular: RRR  Respiratory: intermittent wheezing  has resolved. Some mild bibasilar crackles, no rhonchi  Abdomen: Soft/ND/NT, positive BS  Musculoskeletal: No Edema  Neuro: aaox3    Discharge Instructions You were cared for by a hospitalist during your hospital stay. If you have any questions about your discharge medications or the care you received while you were in the hospital after you are discharged, you can call the unit and asked to speak with the hospitalist on call if the hospitalist that took care of you is not available. Once you are discharged, your primary care physician will handle any further medical issues. Please note that NO REFILLS for any discharge medications will be authorized once you are discharged, as it is imperative that you return to your primary care physician (or establish a relationship with a primary care physician if you do not have one) for your aftercare needs so that they can reassess  your need for medications and monitor your lab values.  Discharge Instructions    Diet - low sodium heart healthy    Complete by:  As directed    Increase activity slowly    Complete by:  As directed      Allergies as of 12/16/2016      Reactions   Benadryl [diphenhydramine] Other (See Comments)   priapism   Trazodone And Nefazodone    priapism   Latex Itching, Rash      Medication List    STOP taking these medications   predniSONE 50 MG tablet Commonly known as:  DELTASONE     TAKE these medications   acetaminophen 500 MG tablet Commonly known as:  TYLENOL Take 1,000 mg by mouth every 6 (six) hours as needed for fever.   acyclovir 800 MG tablet Commonly known as:  ZOVIRAX Take 800 mg by mouth 2 (two) times daily.   aluminum-magnesium hydroxide-simethicone 798-921-19 MG/5ML Susp Commonly known as:  MAALOX Take 30 mLs by mouth every 4 (four) hours as needed.   amoxicillin-clavulanate 875-125 MG tablet Commonly known as:  AUGMENTIN Take 1 tablet by mouth 2 (two) times daily.   aspirin 81 MG chewable tablet Chew 81 mg by  mouth daily.   CALCIUM PO Take 1 tablet by mouth daily.   cholecalciferol 1000 units tablet Commonly known as:  VITAMIN D Take 1,000 Units by mouth daily.   dexamethasone 4 MG tablet Commonly known as:  DECADRON Take 5 tablets (20 mg total) by mouth once a week.   folic acid 1 MG tablet Commonly known as:  FOLVITE Take 1 mg by mouth daily.   gabapentin 300 MG capsule Commonly known as:  NEURONTIN Take 2 capsules (600 mg total) by mouth 2 (two) times daily. What changed:  how much to take  additional instructions   guaiFENesin-dextromethorphan 100-10 MG/5ML syrup Commonly known as:  ROBITUSSIN DM Take 5 mLs by mouth every 4 (four) hours as needed for cough.   lidocaine-prilocaine cream Commonly known as:  EMLA Apply to affected area once   lisinopril 20 MG tablet Commonly known as:  PRINIVIL,ZESTRIL Take 1 tablet (20 mg total)  by mouth daily.   loperamide 2 MG capsule Commonly known as:  IMODIUM Take 2 mg by mouth 4 (four) times daily as needed.   multivitamin tablet Take 1 tablet by mouth daily.   ondansetron 8 MG tablet Commonly known as:  ZOFRAN Take 8 mg by mouth every 8 (eight) hours as needed.   pantoprazole 40 MG tablet Commonly known as:  PROTONIX Take 1 tablet (40 mg total) by mouth daily.   pomalidomide 3 MG capsule Commonly known as:  POMALYST Take 1 capsule (3 mg total) by mouth daily. Take with water on days 1-21. Repeat every 28 days.   prochlorperazine 10 MG tablet Commonly known as:  COMPAZINE Take 10 mg by mouth every 6 (six) hours as needed.   ranitidine 150 MG tablet Commonly known as:  ZANTAC Take 1 tablet (150 mg total) by mouth at bedtime.   sulfamethoxazole-trimethoprim 800-160 MG tablet Commonly known as:  BACTRIM DS,SEPTRA DS Take 1 tablet by mouth 3 (three) times a week.   traMADol 50 MG tablet Commonly known as:  ULTRAM TAKE 1 TO 2 TABLETS BY MOUTH EVERY 6 HOURS AS NEEDED FOR PAIN   zolpidem 10 MG tablet Commonly known as:  AMBIEN Take 1 tablet (10 mg total) by mouth at bedtime as needed for sleep.      Allergies  Allergen Reactions  . Benadryl [Diphenhydramine] Other (See Comments)    priapism  . Trazodone And Nefazodone     priapism  . Latex Itching and Rash   Follow-up Information    Heath Lark, MD Follow up on 12/20/2016.   Specialty:  Hematology and Oncology Contact information: [place] [id number] [phone]        Arnoldo Morale, MD Follow up in 1 week(s).   Specialty:  Family Medicine Why:  hospital discharge follow up, repeat cbc/bmp at follow up Contact information: [place] [address] [phone]            The results of significant diagnostics from this hospitalization (including imaging, microbiology, ancillary and laboratory) are listed below for reference.     Significant Diagnostic Studies: Dg Chest 1 View  Result Date: 12/10/2016 CLINICAL DATA:  61 year old male with fever. EXAM: CHEST 1 VIEW COMPARISON:  Chest radiograph dated 12/09/2016 FINDINGS: Right-sided infusion catheter with tip in stable positioning at the level of the cavoatrial junction. The lungs are clear. There is no pleural effusion or pneumothorax. The cardiac silhouette is within normal limits. A 2.2 x 2.5 cm expansile lytic lesion is again seen in  the right clavicle with no significant interval change. No acute fracture. IMPRESSION: 1. No acute cardiopulmonary process. 2. Expansile lytic lesion in the right clavicle as seen on the prior radiograph. No acute fracture. Electronically Signed   By: Anner Crete M.D.   On: 12/10/2016 06:24   Dg Chest 2 View  Result Date: 12/16/2016 CLINICAL DATA:  History of multiple myeloma.  Cough. EXAM: CHEST  2 VIEW COMPARISON:  CT of the chest 12/13/2016, and previous chest radiographs. FINDINGS: Right-sided Port-A-Cath in stable position. Cardiomediastinal silhouette is normal. Mediastinal contours appear intact. There is no evidence of focal airspace consolidation, pleural effusion or pneumothorax. Mild bronchiectasis with central predominance persists. Osseous structures are without acute abnormality. Soft tissues are grossly normal. IMPRESSION: Persistent mild bronchiectasis with central predominance. No evidence of focal airspace consolidation. Electronically Signed   By: Fidela Salisbury M.D.   On: 12/16/2016 12:02   Dg Chest 2 View  Result Date: 12/13/2016 CLINICAL DATA:  Cough for 1 week EXAM: CHEST  2 VIEW COMPARISON:  12/10/2016 FINDINGS: Cardiac shadow is within normal limits. Right-sided chest wall port is noted in satisfactory position. The lungs are well aerated bilaterally. No acute bony abnormality is seen. Stable lytic lesion is noted within the right clavicle. IMPRESSION: No acute abnormality noted. Electronically Signed   By: Inez Catalina M.D.   On: 12/13/2016 17:22   Dg Chest 2 View  Result Date: 12/09/2016 CLINICAL DATA:  Shortness of breath, cough, and chest pressure. Former smoker. History of multiple myeloma on chemotherapy. EXAM: CHEST  2 VIEW COMPARISON:  05/23/2016 FINDINGS: Right jugular Port-A-Cath terminates over the lower SVC, unchanged. Cardiomediastinal silhouette is within normal limits. The lungs are well inflated and clear. No pleural effusion or pneumothorax is identified. A lytic lesion is again seen in the right clavicle, and a lytic lesion and pathologic fracture involving the T9 vertebral body are similar to the prior study, consistent with history of multiple myeloma. IMPRESSION: No active cardiopulmonary disease. Electronically Signed   By: Logan Bores M.D.   On: 12/09/2016 16:44   Ct Angio Chest Pe W And/or Wo Contrast  Result Date: 12/13/2016 CLINICAL DATA:  Shortness of breath. Near syncope. Multiple myeloma. EXAM: CT ANGIOGRAPHY CHEST WITH CONTRAST TECHNIQUE: Multidetector CT imaging of the chest was performed using the standard protocol during bolus administration of intravenous contrast. Multiplanar CT image reconstructions and MIPs were obtained to evaluate the vascular anatomy. CONTRAST:  Reference EMR for dose.  Patient had 2 boluses. COMPARISON:  05/23/2016 FINDINGS: Cardiovascular: Satisfactory opacification of the pulmonary arteries to the segmental level, but sensitivity is randomly diminished by respiratory motion. No evidence of pulmonary embolism. Normal heart size. No pericardial effusion. Coronary atherosclerosis. Mediastinum/Nodes: Negative for adenopathy. Porta catheter in good position, tip at the upper cavoatrial junction. Stable patulous appearance of the esophagus. Lungs/Pleura: Advanced diffuse airway thickening with mild atelectasis. No consolidation, edema, effusion, or pneumothorax. Upper Abdomen: No acute finding Musculoskeletal: Diffuse demineralization with numerous lytic lesions  in this patient with known multiple myeloma. Pathologic fracture of the T9 body which is chronic; unchanged height loss. Review of the MIP images confirms the above findings. IMPRESSION: 1. No evidence of pulmonary embolism. Sensitivity is moderately degraded by respiratory motion. 2. Advanced bronchitic type airway thickening with mild atelectasis. 3. Multiple myeloma.  No acute osseous finding. 4. Coronary atherosclerosis. Electronically Signed   By: Monte Fantasia M.D.   On: 12/13/2016 20:46    Microbiology: Recent Results (from the past 240 hour(s))  Blood  culture (routine x 2)     Status: None   Collection Time: 12/09/16  5:13 PM  Result Value Ref Range Status   Specimen Description BLOOD RIGHT PORT  Final   Special Requests   Final    BOTTLES DRAWN AEROBIC AND ANAEROBIC Blood Culture adequate volume   Culture NO GROWTH 5 DAYS  Final   Report Status 12/14/2016 FINAL  Final  Urine culture     Status: None   Collection Time: 12/09/16  5:14 PM  Result Value Ref Range Status   Specimen Description URINE, RANDOM  Final   Special Requests NONE  Final   Culture   Final    NO GROWTH Performed at [facility], [place] [address], [place], [place] [address]    Report Status 12/10/2016 FINAL  Final  MRSA PCR Screening     Status: None   Collection Time: 12/09/16  6:56 PM  Result Value Ref Range Status   MRSA by PCR NEGATIVE NEGATIVE Final    Comment:        The GeneXpert MRSA Assay (FDA approved for NASAL specimens only), is one component of a comprehensive MRSA colonization surveillance program. It is not intended to diagnose MRSA infection nor to guide or monitor treatment for MRSA infections.   Blood culture (routine x 2)     Status: None   Collection Time: 12/09/16  8:21 PM  Result Value Ref Range Status   Specimen Description BLOOD LEFT ARM  Final   Special Requests   Final    BOTTLES DRAWN AEROBIC AND ANAEROBIC Blood Culture adequate volume   Culture NO GROWTH 5 DAYS   Final   Report Status 12/14/2016 FINAL  Final  Culture, blood (Routine x 2)     Status: None (Preliminary result)   Collection Time: 12/13/16  4:30 PM  Result Value Ref Range Status   Specimen Description BLOOD PORTA CATH  Final   Special Requests   Final    BOTTLES DRAWN AEROBIC AND ANAEROBIC Blood Culture adequate volume   Culture   Final    NO GROWTH 2 DAYS Performed at [facility], [place] [address] [place], [place] [address]    Report Status PENDING  Incomplete  Culture, blood (Routine x 2)     Status: None (Preliminary result)   Collection Time: 12/13/16  4:42 PM  Result Value Ref Range Status   Specimen Description BLOOD BLOOD LEFT HAND  Final   Special Requests   Final    BOTTLES DRAWN AEROBIC AND ANAEROBIC Blood Culture adequate volume   Culture   Final    NO GROWTH 2 DAYS Performed at [facility], [place] [address] [place], [place] [address]    Report Status PENDING  Incomplete  Culture, expectorated sputum-assessment     Status: None   Collection Time: 12/14/16 12:43 PM  Result Value Ref Range Status   Specimen Description SPU  Final   Special Requests NONE  Final   Sputum evaluation THIS SPECIMEN IS ACCEPTABLE FOR SPUTUM CULTURE  Final   Report Status 12/14/2016 FINAL  Final  Culture, respiratory (NON-Expectorated)     Status: None   Collection Time: 12/14/16 12:43 PM  Result Value Ref Range Status   Specimen Description SPU  Final   Special Requests NONE Reflexed from [id number]  Final   Gram Stain   Final    ABUNDANT WBC PRESENT,BOTH PMN AND MONONUCLEAR ABUNDANT GRAM NEGATIVE RODS FEW GRAM POSITIVE COCCI IN PAIRS FEW GRAM  POSITIVE RODS RARE GRAM NEGATIVE COCCI IN PAIRS    Culture   Final    Consistent with normal respiratory flora. Performed at [facility], [place] [address], [place], [place] [address]    Report Status 12/16/2016 FINAL  Final  Respiratory Panel by PCR     Status: Abnormal   Collection Time: 12/14/16  2:35 PM  Result Value Ref  Range Status   Adenovirus NOT DETECTED NOT DETECTED Final   Coronavirus 229E NOT DETECTED NOT DETECTED Final   Coronavirus HKU1 NOT DETECTED NOT DETECTED Final   Coronavirus NL63 NOT DETECTED NOT DETECTED Final   Coronavirus OC43 NOT DETECTED NOT DETECTED Final   Metapneumovirus DETECTED (A) NOT DETECTED Final   Rhinovirus / Enterovirus NOT DETECTED NOT DETECTED Final   Influenza A NOT DETECTED NOT DETECTED Final   Influenza B NOT DETECTED NOT DETECTED Final   Parainfluenza Virus 1 NOT DETECTED NOT DETECTED Final   Parainfluenza Virus 2 NOT DETECTED NOT DETECTED Final   Parainfluenza Virus 3 NOT DETECTED NOT DETECTED Final   Parainfluenza Virus 4 NOT DETECTED NOT DETECTED Final   Respiratory Syncytial Virus NOT DETECTED NOT DETECTED Final   Bordetella pertussis NOT DETECTED NOT DETECTED Final   Chlamydophila pneumoniae NOT DETECTED NOT DETECTED Final   Mycoplasma pneumoniae NOT DETECTED NOT DETECTED Final    Comment: Performed at [facility], [place] [address] [place], [place] [address]     Labs: Basic Metabolic Panel:  Recent Labs Lab 12/10/16 [phone] 12/13/16 1625 12/14/16 0546 12/15/16 0428 12/16/16 0418  NA 138 136 139 140 135  K 4.0 4.4 4.2 3.7 3.6  CL 108 104 107 109 102  CO2 24 23 20* 23 24  GLUCOSE 115* 222* 271* 118* 94  BUN _0 CREATININE 0.91 1.24 1.17 0.95 1.03  CALCIUM 8.4* 8.8* 8.7* 8.6* 8.5*  MG  --   --   --   --  1.8   Liver Function Tests:  Recent Labs Lab 12/09/16 1712 12/13/16 1625  AST 20 22  ALT 26 30  ALKPHOS 44 46  BILITOT 0.5 0.5  PROT 6.2* 7.0  ALBUMIN 3.6 3.9   No results for input(s): LIPASE, AMYLASE in the last 168 hours. No results for input(s): AMMONIA in the last 168 hours. CBC:  Recent Labs Lab 12/09/16 1712 12/10/16 0626 12/13/16 1625 12/14/16 0546 12/15/16 0428 12/16/16 0418  WBC 7.0 5.9 2.8* 2.6* 3.6* 2.4*  NEUTROABS 5.4  --  2.4  --  2.3  --   HGB 13.3 13.0 13.2 12.1* 12.0* 12.7*  HCT 38.3* 37.8*  39.5 36.6* 34.3* 36.6*  MCV 91.2 93.3 92.7 92.4 90.5 89.7  PLT 144* 126* 108* 101* 111* 124*   Cardiac Enzymes: No results for input(s): CKTOTAL, CKMB, CKMBINDEX, TROPONINI in the last 168 hours. BNP: BNP (last 3 results)  Recent Labs  02/09/16 0854  BNP 3.1    ProBNP (last 3 results) No results for input(s): PROBNP in the last 8760 hours.  CBG:  Recent Labs Lab 12/15/16 1154 12/15/16 1648 12/15/16 2048 12/16/16 0751 12/16/16 1131  GLUCAP 88 103* 92 98 101*       Signed:  Maizie Garno MD, PhD  [facility] 12/16/2016, 2:12 PM

## 2016-12-16 NOTE — Care Management Note (Signed)
Case Management Note  Patient Details  Name: Erik Nguyen MRN: [id number] Date of Birth: 11/06/1955  Subjective/Objective:     Pt admitted with SOB               Action/Plan: Plan to discharge with no needs.    Expected Discharge Date:  12/16/16               Expected Discharge Plan:  Home/Self Care  In-House Referral:     Discharge planning Services  CM Consult  Post Acute Care Choice:    Choice offered to:     DME Arranged:    DME Agency:     HH Arranged:    HH Agency:     Status of Service:  Completed, signed off  If discussed at [employer] of Stay Meetings, dates discussed:    Additional CommentsPurcell Mouton, RN 12/16/2016, 4:08 PM

## 2016-12-16 NOTE — Progress Notes (Signed)
Erik Nguyen   DOB:1956-08-12   YZ#:[id number]    Subjective: He continues to have excessive coughing.  Low-grade fever this morning.  His weakness has improved  Objective:  Vitals:   12/15/16 2050 12/16/16 0548  BP: (!) 152/100 (!) 126/109  Pulse: 90 (!) 102  Resp: 18 18  Temp: 99.5 F (37.5 C) (!) 100.4 F (38 C)     Intake/Output Summary (Last 24 hours) at 12/16/16 1034 Last data filed at 12/16/16 1008  Gross per 24 hour  Intake             1350 ml  Output              175 ml  Net             1175 ml    GENERAL:alert, no distress and comfortable SKIN: skin color, texture, turgor are normal, no rashes or significant lesions EYES: normal, Conjunctiva are pink and non-injected, sclera clear OROPHARYNX:no exudate, no erythema and lips, buccal mucosa, and tongue normal  NECK: supple, thyroid normal size, non-tender, without nodularity LYMPH:  no palpable lymphadenopathy in the cervical, axillary or inguinal LUNGS bilateral rhonchi at the lung bases HEART: regular rate & rhythm and no murmurs and no lower extremity edema ABDOMEN:abdomen soft, non-tender and normal bowel sounds Musculoskeletal:no cyanosis of digits and no clubbing  NEURO: alert & oriented x 3 with fluent speech, no focal motor/sensory deficits   Labs:  Lab Results  Component Value Date   WBC 2.4 (L) 12/16/2016   HGB 12.7 (L) 12/16/2016   HCT 36.6 (L) 12/16/2016   MCV 89.7 12/16/2016   PLT 124 (L) 12/16/2016   NEUTROABS 2.3 12/15/2016    Lab Results  Component Value Date   NA 135 12/16/2016   K 3.6 12/16/2016   CL 102 12/16/2016   CO2 24 12/16/2016   Assessment & Plan:   Multiple myeloma status post transplant, disease relapse Continue aggressive supportive care His next chemotherapy would be due on 12/20/2016  Pancytopenia Due to recent chemotherapy He does not need blood transfusion or G-CSF support  Recent sepsis/infection Cultures are pending, respiratory panel positive for  Metapneumovirus Would defer to primary service for antibiotic coverage  Hyperglycemia, improved Secondary to steroid treatment Steroid treatment is stopped as this is not necessary at this point from the myeloma treatment standpoint  Cough, lung crackles Could be due to infection Incentive spirometry  Hypertension Could be due to fluid retention from steroid treatment Monitor carefully and consider antihypertensives  CODE STATUS Full code  Discharge planning Hopefully soon, defer to primary service He has appointment to see me next week. Heath Lark, MD 12/16/2016  10:34 AM

## 2016-12-18 LAB — CULTURE, BLOOD (ROUTINE X 2)
Culture: NO GROWTH
Culture: NO GROWTH
SPECIAL REQUESTS: ADEQUATE
Special Requests: ADEQUATE

## 2016-12-20 ENCOUNTER — Other Ambulatory Visit: Payer: Self-pay

## 2016-12-20 ENCOUNTER — Other Ambulatory Visit (HOSPITAL_BASED_OUTPATIENT_CLINIC_OR_DEPARTMENT_OTHER): Payer: Medicaid Other

## 2016-12-20 ENCOUNTER — Ambulatory Visit (HOSPITAL_BASED_OUTPATIENT_CLINIC_OR_DEPARTMENT_OTHER): Payer: Medicaid Other | Admitting: Hematology and Oncology

## 2016-12-20 ENCOUNTER — Encounter: Payer: Self-pay | Admitting: Hematology and Oncology

## 2016-12-20 ENCOUNTER — Ambulatory Visit: Payer: Medicaid Other

## 2016-12-20 ENCOUNTER — Telehealth: Payer: Self-pay | Admitting: Hematology and Oncology

## 2016-12-20 DIAGNOSIS — R05 Cough: Secondary | ICD-10-CM

## 2016-12-20 DIAGNOSIS — G893 Neoplasm related pain (acute) (chronic): Secondary | ICD-10-CM

## 2016-12-20 DIAGNOSIS — R059 Cough, unspecified: Secondary | ICD-10-CM

## 2016-12-20 DIAGNOSIS — G620 Drug-induced polyneuropathy: Secondary | ICD-10-CM

## 2016-12-20 DIAGNOSIS — C9001 Multiple myeloma in remission: Secondary | ICD-10-CM

## 2016-12-20 DIAGNOSIS — D61810 Antineoplastic chemotherapy induced pancytopenia: Secondary | ICD-10-CM

## 2016-12-20 DIAGNOSIS — T451X5A Adverse effect of antineoplastic and immunosuppressive drugs, initial encounter: Secondary | ICD-10-CM

## 2016-12-20 LAB — COMPREHENSIVE METABOLIC PANEL
ALBUMIN: 3.5 g/dL (ref 3.5–5.0)
ALK PHOS: 47 U/L (ref 40–150)
ALT: 33 U/L (ref 0–55)
AST: 13 U/L (ref 5–34)
Anion Gap: 9 mEq/L (ref 3–11)
BILIRUBIN TOTAL: 0.26 mg/dL (ref 0.20–1.20)
BUN: 19.8 mg/dL (ref 7.0–26.0)
CALCIUM: 9.5 mg/dL (ref 8.4–10.4)
CO2: 23 mEq/L (ref 22–29)
Chloride: 106 mEq/L (ref 98–109)
Creatinine: 1.1 mg/dL (ref 0.7–1.3)
EGFR: 87 mL/min/{1.73_m2} — ABNORMAL LOW (ref >90)
Glucose: 184 mg/dl — ABNORMAL HIGH (ref 70–140)
POTASSIUM: 4.4 meq/L (ref 3.5–5.1)
Sodium: 138 mEq/L (ref 136–145)
TOTAL PROTEIN: 6.6 g/dL (ref 6.4–8.3)

## 2016-12-20 LAB — CBC WITH DIFFERENTIAL/PLATELET
BASO%: 0.1 % (ref 0.0–2.0)
Basophils Absolute: 0 10*3/uL (ref 0.0–0.1)
EOS ABS: 0 10*3/uL (ref 0.0–0.5)
EOS%: 0 % (ref 0.0–7.0)
HEMATOCRIT: 36.3 % — AB (ref 38.4–49.9)
HEMOGLOBIN: 12.4 g/dL — AB (ref 13.0–17.1)
LYMPH#: 0.6 10*3/uL — AB (ref 0.9–3.3)
LYMPH%: 26.8 % (ref 14.0–49.0)
MCH: 31.3 pg (ref 27.2–33.4)
MCHC: 34.1 g/dL (ref 32.0–36.0)
MCV: 91.9 fL (ref 79.3–98.0)
MONO#: 0.8 10*3/uL (ref 0.1–0.9)
MONO%: 33 % — ABNORMAL HIGH (ref 0.0–14.0)
NEUT#: 1 10*3/uL — ABNORMAL LOW (ref 1.5–6.5)
NEUT%: 40.1 % (ref 39.0–75.0)
Platelets: 257 10*3/uL (ref 140–400)
RBC: 3.95 10*6/uL — ABNORMAL LOW (ref 4.20–5.82)
RDW: 15.2 % — AB (ref 11.0–14.6)
WBC: 2.4 10*3/uL — ABNORMAL LOW (ref 4.0–10.3)

## 2016-12-20 MED ORDER — SODIUM CHLORIDE 0.9% FLUSH
10.0000 mL | Freq: Once | INTRAVENOUS | Status: AC
  Administered 2016-12-20: 10 mL
  Filled 2016-12-20: qty 10

## 2016-12-20 MED ORDER — POMALIDOMIDE 2 MG PO CAPS: 2.0000 mg | ORAL_CAPSULE | Freq: Every day | ORAL | 0 refills | Status: DC

## 2016-12-20 NOTE — Progress Notes (Signed)
[place] OFFICE PROGRESS NOTE  Patient Care Team: Arnoldo Morale, MD as PCP - General (Family Medicine) Melburn Hake, Costella Hatcher, MD as Consulting Physician (Hematology and Oncology)  SUMMARY OF ONCOLOGIC HISTORY:   Multiple myeloma in remission [facility])   08/28/2015 Bone Marrow Biopsy    Accession: [id number] biopsy showed 58% myeloma involvement. Cytogenetics was normal and FISH showed Del 13q and t(11;14).       09/07/2015 - 01/22/2016 Chemotherapy    he received 6 cycles of treatment of Velcade and Revlimid with Dex       09/12/2015 - 09/16/2015 Hospital Admission    He was admitted to the hospital with sepsis, treatment was placed on hold and he received one dose of GCSF for leukopenia      01/12/2016 Adverse Reaction    Delay treatment due to neutropenia      01/27/2016 Imaging    ECHO at [facility] showed normal EF      01/27/2016 PET scan    PET scan at [facility] showed innumerable lytic lesions throughout      01/27/2016 Procedure    PFT is within normal limits at [facility]      01/28/2016 Bone Marrow Biopsy    Bone marrow at [facility] showed persistent plasma cell myeloma in a normocellularmarrow (30%) with 20% atypical plasma cells and myeloid hyperplasia.      02/11/2016 - 06/10/2016 Chemotherapy    He is started on salvage Rx with Kyprolis, Cytoxan and dexamethasone      03/24/2016 Imaging    US venous Doppler showed no evidence of deep vein or superficial thrombosis involving the right lower extremity and left lower extremity. No evidence of Baker&'s cyst on the right or left.      05/18/2016 - 05/19/2016 Hospital Admission    He was admitted for chest pain evaluation. Cardiac work-up excluded cardiac events and CT angiogram were negative for PE. It showed esphageal thickening c/w reflux esophagitis likely cause for his chest discomfort      05/23/2016 - 05/25/2016 Hospital Admission    Patient was hospitalized for worsening shortness of breath  and worsening pain. CT angiogram chest negative for pulmonary emboli. Patient had a recent normal 2-D echo and ruled out for MI. Patient noted to be dehydrated. Patient admitted placed on IV fluids. Diuretic discontinued. Patient's gabapentin has been increased per recommendations from prior office visit from patient's oncologist to 600 mg 3 times daily      06/15/2016 Bone Marrow Biopsy    Bone marrow biopsy showed only 2% plasma cell, normal FISH and cytogenetics      07/26/2016 - 07/26/2016 Chemotherapy    He received high dose melphalan at [facility]       07/27/2016 Bone Marrow Transplant    He received autologous stem cell transplant at [facility]      08/05/2016 - 08/10/2016 Hospital Admission    He was admitted to [facility] for management of neutropenic fever and diarrhea. Cultures were negative. Stool culture came back positive for Novovirus and he was managed conservatively.      11/03/2016 Bone Marrow Biopsy    Bone Marrow Flow Cytometry - NO MONOCLONAL B CELL POPULATION IDENTIFIED. - PREDOMINANCE OF T CELLS WITH NON SPECIFIC CHANGES. - SEE NOTE. Diagnosis Comment: Analysis of the lymphoid population shows predominance of T lymphocytes expressing pan T-cell antigens but with relative abundance of CD8 positive cells and reversal of the CD4:CD8 ratio. No significant CD16/56 expression is identified. The T-cell changes  are not considered specific in this setting. B cells represent the minor population with no monoclonality or abnormal phenotype. 4% lambda light chain restricted plasma cells. FISH positive CCND1/IGH 12% of cells.       11/09/2016 PET scan    No FDG avid myeloma is identified. Numerous osseous lytic lesions as above. 2.Hypermetabolic left level III lymph node, likely reactive etiology.  3.Ancillary CT findings as above.      11/29/2016 -  Chemotherapy    He received treatment with daratumumab and Pomalyst       12/13/2016 - 12/16/2016 Hospital Admission    He was  admitted to the hospital for management of dehydration and early sepsis       INTERVAL HISTORY: Please see below for problem oriented charting. He returns after recent hospitalization to restart treatment He continues to have mild congestion, cough and productive sputum He denies fever or chills His appetite is stable Denies worsening peripheral neuropathy No recent nausea vomiting Overall, he is still weak from recent hospitalization  REVIEW OF SYSTEMS:   Constitutional: Denies fevers, chills or abnormal weight loss Eyes: Denies blurriness of vision Ears, nose, mouth, throat, and face: Denies mucositis or sore throat Cardiovascular: Denies palpitation, chest discomfort or lower extremity swelling Gastrointestinal:  Denies nausea, heartburn or change in bowel habits Skin: Denies abnormal skin rashes Lymphatics: Denies new lymphadenopathy or easy bruising Neurological:Denies numbness, tingling or new weaknesses Behavioral/Psych: Mood is stable, no new changes  All other systems were reviewed with the patient and are negative.  I have reviewed the past medical history, past surgical history, social history and family history with the patient and they are unchanged from previous note.  ALLERGIES:  is allergic to benadryl [diphenhydramine]; trazodone and nefazodone; and latex.  MEDICATIONS:  Current Outpatient Prescriptions  Medication Sig Dispense Refill  . acetaminophen (TYLENOL) 500 MG tablet Take 1,000 mg by mouth every 6 (six) hours as needed for fever.    Marland Kitchen acyclovir (ZOVIRAX) 800 MG tablet Take 800 mg by mouth 2 (two) times daily.    Marland Kitchen aluminum-magnesium hydroxide-simethicone (MAALOX) 294-765-46 MG/5ML SUSP Take 30 mLs by mouth every 4 (four) hours as needed.    Marland Kitchen amoxicillin-clavulanate (AUGMENTIN) 875-125 MG tablet Take 1 tablet by mouth 2 (two) times daily. 8 tablet 0  . aspirin 81 MG chewable tablet Chew 81 mg by mouth daily.    Marland Kitchen CALCIUM PO Take 1 tablet by mouth daily.     . cholecalciferol (VITAMIN D) 1000 units tablet Take 1,000 Units by mouth daily.     Marland Kitchen dexamethasone (DECADRON) 4 MG tablet Take 5 tablets (20 mg total) by mouth once a week. 60 tablet 9  . folic acid (FOLVITE) 1 MG tablet Take 1 mg by mouth daily.  1  . gabapentin (NEURONTIN) 300 MG capsule Take 2 capsules (600 mg total) by mouth 2 (two) times daily. (Patient taking differently: Take 300-600 mg by mouth 2 (two) times daily. Take 300 mg in the morning and 600 mg at night.) 90 capsule 9  . guaiFENesin-dextromethorphan (ROBITUSSIN DM) 100-10 MG/5ML syrup Take 5 mLs by mouth every 4 (four) hours as needed for cough. 118 mL 0  . lidocaine-prilocaine (EMLA) cream Apply to affected area once 30 g 3  . lisinopril (PRINIVIL,ZESTRIL) 20 MG tablet Take 1 tablet (20 mg total) by mouth daily. 30 tablet 6  . Multiple Vitamin (MULTIVITAMIN) tablet Take 1 tablet by mouth daily.  1  . pomalidomide (POMALYST) 3 MG capsule Take 1 capsule (3  mg total) by mouth daily. Take with water on days 1-21. Repeat every 28 days. 21 capsule 11  . prochlorperazine (COMPAZINE) 10 MG tablet Take 10 mg by mouth every 6 (six) hours as needed.    . ranitidine (ZANTAC) 150 MG tablet Take 1 tablet (150 mg total) by mouth at bedtime. 30 tablet 3  . sulfamethoxazole-trimethoprim (BACTRIM DS,SEPTRA DS) 800-160 MG tablet Take 1 tablet by mouth 3 (three) times a week.  1  . traMADol (ULTRAM) 50 MG tablet TAKE 1 TO 2 TABLETS BY MOUTH EVERY 6 HOURS AS NEEDED FOR PAIN 90 tablet 0  . zolpidem (AMBIEN) 10 MG tablet Take 1 tablet (10 mg total) by mouth at bedtime as needed for sleep. 30 tablet 3  . loperamide (IMODIUM) 2 MG capsule Take 2 mg by mouth 4 (four) times daily as needed.    . ondansetron (ZOFRAN) 8 MG tablet Take 8 mg by mouth every 8 (eight) hours as needed.    . pantoprazole (PROTONIX) 40 MG tablet Take 1 tablet (40 mg total) by mouth daily. (Patient not taking: Reported on 12/20/2016) 30 tablet 6   No current facility-administered  medications for this visit.     PHYSICAL EXAMINATION: ECOG PERFORMANCE STATUS: 1 - Symptomatic but completely ambulatory  Vitals:   12/20/16 0927  BP: 135/83  Pulse: 86  Resp: 18  Temp: 98.2 F (36.8 C)   Filed Weights   12/20/16 0927  Weight: 166 lb 3.2 oz (75.4 kg)    GENERAL:alert, no distress and comfortable SKIN: skin color, texture, turgor are normal, no rashes or significant lesions EYES: normal, Conjunctiva are pink and non-injected, sclera clear OROPHARYNX:no exudate, no erythema and lips, buccal mucosa, and tongue normal  NECK: supple, thyroid normal size, non-tender, without nodularity LYMPH:  no palpable lymphadenopathy in the cervical, axillary or inguinal LUNGS: Noted rhonchi in bilateral lung bases HEART: regular rate & rhythm and no murmurs and no lower extremity edema ABDOMEN:abdomen soft, non-tender and normal bowel sounds Musculoskeletal:no cyanosis of digits and no clubbing  NEURO: alert & oriented x 3 with fluent speech, no focal motor/sensory deficits  LABORATORY DATA:  I have reviewed the data as listed    Component Value Date/Time   NA 138 12/20/2016 0808   K 4.4 12/20/2016 0808   CL 102 12/16/2016 0418   CO2 23 12/20/2016 0808   GLUCOSE 184 (H) 12/20/2016 0808   BUN 19.8 12/20/2016 0808   CREATININE 1.1 12/20/2016 0808   CALCIUM 9.5 12/20/2016 0808   PROT 6.6 12/20/2016 0808   ALBUMIN 3.5 12/20/2016 0808   AST 13 12/20/2016 0808   ALT 33 12/20/2016 0808   ALKPHOS 47 12/20/2016 0808   BILITOT 0.26 12/20/2016 0808   GFRNONAA >60 12/16/2016 0418   GFRNONAA 71 07/24/2015 1155   GFRAA >60 12/16/2016 0418   GFRAA 82 07/24/2015 1155    No results found for: SPEP, UPEP  Lab Results  Component Value Date   WBC 2.4 (L) 12/20/2016   NEUTROABS 1.0 (L) 12/20/2016   HGB 12.4 (L) 12/20/2016   HCT 36.3 (L) 12/20/2016   MCV 91.9 12/20/2016   PLT 257 12/20/2016      Chemistry      Component Value Date/Time   NA 138 12/20/2016 0808   K 4.4  12/20/2016 0808   CL 102 12/16/2016 0418   CO2 23 12/20/2016 0808   BUN 19.8 12/20/2016 0808   CREATININE 1.1 12/20/2016 0808      Component Value Date/Time   CALCIUM  9.5 12/20/2016 0808   ALKPHOS 47 12/20/2016 0808   AST 13 12/20/2016 0808   ALT 33 12/20/2016 0808   BILITOT 0.26 12/20/2016 0808       RADIOGRAPHIC STUDIES: I have personally reviewed the radiological images as listed and agreed with the findings in the report. Dg Chest 1 View  Result Date: 12/10/2016 CLINICAL DATA:  61 year old male with fever. EXAM: CHEST 1 VIEW COMPARISON:  Chest radiograph dated 12/09/2016 FINDINGS: Right-sided infusion catheter with tip in stable positioning at the level of the cavoatrial junction. The lungs are clear. There is no pleural effusion or pneumothorax. The cardiac silhouette is within normal limits. A 2.2 x 2.5 cm expansile lytic lesion is again seen in the right clavicle with no significant interval change. No acute fracture. IMPRESSION: 1. No acute cardiopulmonary process. 2. Expansile lytic lesion in the right clavicle as seen on the prior radiograph. No acute fracture. Electronically Signed   By: Anner Crete M.D.   On: 12/10/2016 06:24   Dg Chest 2 View  Result Date: 12/16/2016 CLINICAL DATA:  History of multiple myeloma.  Cough. EXAM: CHEST  2 VIEW COMPARISON:  CT of the chest 12/13/2016, and previous chest radiographs. FINDINGS: Right-sided Port-A-Cath in stable position. Cardiomediastinal silhouette is normal. Mediastinal contours appear intact. There is no evidence of focal airspace consolidation, pleural effusion or pneumothorax. Mild bronchiectasis with central predominance persists. Osseous structures are without acute abnormality. Soft tissues are grossly normal. IMPRESSION: Persistent mild bronchiectasis with central predominance. No evidence of focal airspace consolidation. Electronically Signed   By: Fidela Salisbury M.D.   On: 12/16/2016 12:02   Dg Chest 2  View  Result Date: 12/13/2016 CLINICAL DATA:  Cough for 1 week EXAM: CHEST  2 VIEW COMPARISON:  12/10/2016 FINDINGS: Cardiac shadow is within normal limits. Right-sided chest wall port is noted in satisfactory position. The lungs are well aerated bilaterally. No acute bony abnormality is seen. Stable lytic lesion is noted within the right clavicle. IMPRESSION: No acute abnormality noted. Electronically Signed   By: Inez Catalina M.D.   On: 12/13/2016 17:22   Dg Chest 2 View  Result Date: 12/09/2016 CLINICAL DATA:  Shortness of breath, cough, and chest pressure. Former smoker. History of multiple myeloma on chemotherapy. EXAM: CHEST  2 VIEW COMPARISON:  05/23/2016 FINDINGS: Right jugular Port-A-Cath terminates over the lower SVC, unchanged. Cardiomediastinal silhouette is within normal limits. The lungs are well inflated and clear. No pleural effusion or pneumothorax is identified. A lytic lesion is again seen in the right clavicle, and a lytic lesion and pathologic fracture involving the T9 vertebral body are similar to the prior study, consistent with history of multiple myeloma. IMPRESSION: No active cardiopulmonary disease. Electronically Signed   By: Logan Bores M.D.   On: 12/09/2016 16:44   Ct Angio Chest Pe W And/or Wo Contrast  Result Date: 12/13/2016 CLINICAL DATA:  Shortness of breath. Near syncope. Multiple myeloma. EXAM: CT ANGIOGRAPHY CHEST WITH CONTRAST TECHNIQUE: Multidetector CT imaging of the chest was performed using the standard protocol during bolus administration of intravenous contrast. Multiplanar CT image reconstructions and MIPs were obtained to evaluate the vascular anatomy. CONTRAST:  Reference EMR for dose.  Patient had 2 boluses. COMPARISON:  05/23/2016 FINDINGS: Cardiovascular: Satisfactory opacification of the pulmonary arteries to the segmental level, but sensitivity is randomly diminished by respiratory motion. No evidence of pulmonary embolism. Normal heart size. No  pericardial effusion. Coronary atherosclerosis. Mediastinum/Nodes: Negative for adenopathy. Porta catheter in good position, tip at the upper  cavoatrial junction. Stable patulous appearance of the esophagus. Lungs/Pleura: Advanced diffuse airway thickening with mild atelectasis. No consolidation, edema, effusion, or pneumothorax. Upper Abdomen: No acute finding Musculoskeletal: Diffuse demineralization with numerous lytic lesions in this patient with known multiple myeloma. Pathologic fracture of the T9 body which is chronic; unchanged height loss. Review of the MIP images confirms the above findings. IMPRESSION: 1. No evidence of pulmonary embolism. Sensitivity is moderately degraded by respiratory motion. 2. Advanced bronchitic type airway thickening with mild atelectasis. 3. Multiple myeloma.  No acute osseous finding. 4. Coronary atherosclerosis. Electronically Signed   By: Monte Fantasia M.D.   On: 12/13/2016 20:46    ASSESSMENT & PLAN:  Multiple myeloma in remission [facility]) He is still recovering from side effects of treatment He is neutropenic I recommend holding treatment for both Daratumumab and Pomalyst I will see him again next week and recheck blood work I plan to reduce a dose of Pomalyst for future dosing He will continue to take weekly dexamethasone 20 mg once a week along with antimicrobial therapy as directed He will also continue calcium, vitamin D and Zometa for bone health He will also take aspirin for DVT prophylaxis  Pancytopenia due to antineoplastic chemotherapy ([facility]) Plan to hold treatment today I will see him again next week I plan to reduce dose of Pomalyst in the future  Cough in adult The cough is due to recent infection He is completing a course of antibiotic treatment along with conservative management with cough syrup He denies recent fever He is neutropenic Recommend holding chemotherapy as above We would delay treatment for 1 week  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 We'll continue the same He is on calcium and vitamin D supplement   No orders of the defined types were placed in this encounter.  All questions were answered. The patient knows to call the clinic with any problems, questions or concerns. No barriers to learning was detected. I spent 30 minutes counseling the patient face to face. The total time spent in the appointment was 40 minutes and more than 50% was on counseling and review of test results     Heath Lark, MD 12/20/2016 9:55 AM

## 2016-12-20 NOTE — Assessment & Plan Note (Signed)
The cough is due to recent infection He is completing a course of antibiotic treatment along with conservative management with cough syrup He denies recent fever He is neutropenic Recommend holding chemotherapy as above We would delay treatment for 1 week

## 2016-12-20 NOTE — Assessment & Plan Note (Signed)
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 We'll continue the same He is on calcium and vitamin D supplement 

## 2016-12-20 NOTE — Telephone Encounter (Signed)
Scheduled appt per 5/8 los. - no treatment today . Gave patient AVS and calender per 5/8

## 2016-12-20 NOTE — Assessment & Plan Note (Signed)
Plan to hold treatment today I will see him again next week I plan to reduce dose of Pomalyst in the future

## 2016-12-20 NOTE — Assessment & Plan Note (Signed)
He is still recovering from side effects of treatment He is neutropenic I recommend holding treatment for both Daratumumab and Pomalyst I will see him again next week and recheck blood work I plan to reduce a dose of Pomalyst for future dosing He will continue to take weekly dexamethasone 20 mg once a week along with antimicrobial therapy as directed He will also continue calcium, vitamin D and Zometa for bone health He will also take aspirin for DVT prophylaxis

## 2016-12-20 NOTE — Discharge Summary (Signed)
[name] NAME: Erik Nguyen    MR#:  [id number]  DATE OF BIRTH:  [birth year]  DATE OF ADMISSION:  12/09/2016 ADMITTING PHYSICIAN: Bettey Costa, MD  DATE OF DISCHARGE: 12/10/2016  4:35 PM  PRIMARY CARE PHYSICIAN: Arnoldo Morale, MD   ADMISSION DIAGNOSIS:  Fever [R50.9] Immunosuppressed status ([place]) [D89.9] Sepsis, due to unspecified organism ([place]) [A41.9]  DISCHARGE DIAGNOSIS:  Active Problems:   Sepsis ([place])   SECONDARY DIAGNOSIS:   Past Medical History:  Diagnosis Date  . Encounter for antineoplastic chemotherapy 02/09/2016  . History of syphilis 02/24/2016  . Hypertension Dx 2016  . Multiple myeloma not having achieved remission ([place]) 09/01/2015  . Priapism 2014      ADMITTING HISTORY  Erik Nguyen  is a 61 y.o. 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 Patient reports that his temperature was 100.8.at home. He took OTC cold medications and came to ED for evaluation. He reports nonproductive cough over the past 3 days.Patient denies shortness of breath, chills, urinary symptoms abdominal pain.  Chest x-ray does not show pneumonia. He is started on broad-spectrum antibiotics His last chemotherapy was last Tuesday.   HOSPITAL COURSE:   * Sepsis due to acute bronchitis * Multiple myeloma  Patient was initially started on broad-spectrum antibiotics with cefepime and vancomycin. His cultures remain negative. Chest x-ray showed no pneumonia. Patient improved well with prednisone, breathing treatments. Wheezing improved. Afebrile by the time of discharge. He will be discharged on prednisone and Levaquin for 5 more days. He is to follow with his oncologist as outpatient within a week.  Was to return to emergency room if any ongoing fevers or worsening shortness of breath. Patient has  verbalized understanding.  Stable for discharge home.  CONSULTS OBTAINED:  Treatment Team:  Cammie Sickle, MD  DRUG ALLERGIES:   Allergies  Allergen Reactions  . Benadryl [Diphenhydramine] Other (See Comments)    priapism  . Trazodone And Nefazodone     priapism  . Latex Itching and Rash    DISCHARGE MEDICATIONS:   Discharge Medication List as of 12/10/2016  4:05 PM    START taking these medications   Details  guaiFENesin-dextromethorphan (ROBITUSSIN DM) 100-10 MG/5ML syrup Take 5 mLs by mouth every 4 (four) hours as needed for cough., Starting Sat 12/10/2016, Print    levofloxacin (LEVAQUIN) 750 MG tablet Take 1 tablet (750 mg total) by mouth daily., Starting Sun 12/11/2016, Normal    predniSONE (DELTASONE) 50 MG tablet Take 1 tablet (50 mg total) by mouth daily with breakfast., Starting Sun 12/11/2016, Normal      CONTINUE these medications which have NOT CHANGED   Details  acyclovir (ZOVIRAX) 800 MG tablet Take 800 mg by mouth 2 (two) times daily., Starting Mon 08/01/2016, Historical Med    aluminum-magnesium hydroxide-simethicone (MAALOX) 200-200-20 MG/5ML SUSP Take 30 mLs by mouth every 4 (four) hours as needed., Historical Med    CALCIUM PO Take 1 tablet by mouth daily., Until Discontinued, Historical Med    cholecalciferol (VITAMIN D) 1000 units tablet Take 1,000 Units by mouth daily. , Historical Med    dexamethasone (DECADRON) 4 MG tablet Take 5 tablets (20 mg total) by mouth once a week., Starting Tue 6/00/4599, Normal    folic acid (FOLVITE) 1 MG tablet Take 1 mg by mouth daily., Starting Tue 08/02/2016, Historical Med  gabapentin (NEURONTIN) 300 MG capsule Take 2 capsules (600 mg total) by mouth 2 (two) times daily., Starting Tue 10/11/2016, Normal    !! lidocaine-prilocaine (EMLA) cream Apply to affected area once, Phone In    lisinopril (PRINIVIL,ZESTRIL) 20 MG tablet Take 1 tablet (20 mg total) by mouth daily., Starting Thu 06/23/2016, Normal     loperamide (IMODIUM) 2 MG capsule Take 2 mg by mouth 4 (four) times daily as needed., Starting Wed 08/10/2016, Historical Med    Multiple Vitamin (MULTIVITAMIN) tablet Take 1 tablet by mouth daily., Starting Tue 08/02/2016, Historical Med    ondansetron (ZOFRAN) 8 MG tablet Take 8 mg by mouth every 8 (eight) hours as needed., Starting Wed 08/10/2016, Historical Med    pantoprazole (PROTONIX) 40 MG tablet Take 1 tablet (40 mg total) by mouth daily., Starting Thu 06/23/2016, Normal    pomalidomide (POMALYST) 3 MG capsule Take 1 capsule (3 mg total) by mouth daily. Take with water on days 1-21. Repeat every 28 days., Starting Fri 11/18/2016, Print    prochlorperazine (COMPAZINE) 10 MG tablet Take 10 mg by mouth every 6 (six) hours as needed., Starting Mon 07/25/2016, Historical Med    ranitidine (ZANTAC) 150 MG tablet Take 1 tablet (150 mg total) by mouth at bedtime., Starting Fri 11/18/2016, Until Sat 03/18/2017, Normal    sulfamethoxazole-trimethoprim (BACTRIM DS,SEPTRA DS) 800-160 MG tablet Take 1 tablet by mouth 3 (three) times a week., Starting Mon 09/05/2016, Historical Med    traMADol (ULTRAM) 50 MG tablet TAKE 1 TO 2 TABLETS BY MOUTH EVERY 6 HOURS AS NEEDED FOR PAIN, Print    zolpidem (AMBIEN) 10 MG tablet Take 1 tablet (10 mg total) by mouth at bedtime as needed for sleep., Starting Tue 11/29/2016, Until Thu 12/29/2016, Print    !! lidocaine-prilocaine (EMLA) cream Apply 1 application topically as needed., Starting Tue 11/29/2016, Normal    triamcinolone (NASACORT AQ) 55 MCG/ACT AERO nasal inhaler Place 1 spray into the nose 2 (two) times daily., Starting Thu 12/08/2016, Normal     !! - Potential duplicate medications found. Please discuss with provider.      Today   VITAL SIGNS:  Blood pressure 140/90, pulse 100, temperature 98.7 F (37.1 C), temperature source Oral, resp. rate 20, height 5' 6"  (1.676 m), weight 75.6 kg (166 lb 9.6 oz), SpO2 98 %.  I/O:  No intake or output data in  the 24 hours ending 12/20/16 1431  PHYSICAL EXAMINATION:  Physical Exam  GENERAL:  61 y.o.-year-old patient lying in the bed with no acute distress.  LUNGS: Good air entry. Normal work of breathing. Bilateral mild expiratory wheezing. CARDIOVASCULAR: S1, S2 normal. No murmurs, rubs, or gallops.  ABDOMEN: Soft, non-tender, non-distended. Bowel sounds present. No organomegaly or mass.  NEUROLOGIC: Moves all 4 extremities. PSYCHIATRIC: The patient is alert and oriented x 3.  SKIN: No obvious rash, lesion, or ulcer.   DATA REVIEW:   CBC  Recent Labs Lab 12/20/16 0808  WBC 2.4*  HGB 12.4*  HCT 36.3*  PLT 257    Chemistries   Recent Labs Lab 12/16/16 0418 12/20/16 0808  NA 135 138  K 3.6 4.4  CL 102  --   CO2 24 23  GLUCOSE 94 184*  BUN 20 19.8  CREATININE 1.03 1.1  CALCIUM 8.5* 9.5  MG 1.8  --   AST  --  13  ALT  --  33  ALKPHOS  --  47  BILITOT  --  0.26    Cardiac Enzymes No  results for input(s): TROPONINI in the last 168 hours.  Microbiology Results  Results for orders placed or performed during the hospital encounter of 12/09/16  Blood culture (routine x 2)     Status: None   Collection Time: 12/09/16  5:13 PM  Result Value Ref Range Status   Specimen Description BLOOD RIGHT PORT  Final   Special Requests   Final    BOTTLES DRAWN AEROBIC AND ANAEROBIC Blood Culture adequate volume   Culture NO GROWTH 5 DAYS  Final   Report Status 12/14/2016 FINAL  Final  Urine culture     Status: None   Collection Time: 12/09/16  5:14 PM  Result Value Ref Range Status   Specimen Description URINE, RANDOM  Final   Special Requests NONE  Final   Culture   Final    NO GROWTH Performed at [facility], [place] [address], [place], [place] [address]    Report Status 12/10/2016 FINAL  Final  MRSA PCR Screening     Status: None   Collection Time: 12/09/16  6:56 PM  Result Value Ref Range Status   MRSA by PCR NEGATIVE NEGATIVE Final    Comment:        The GeneXpert  MRSA Assay (FDA approved for NASAL specimens only), is one component of a comprehensive MRSA colonization surveillance program. It is not intended to diagnose MRSA infection nor to guide or monitor treatment for MRSA infections.   Blood culture (routine x 2)     Status: None   Collection Time: 12/09/16  8:21 PM  Result Value Ref Range Status   Specimen Description BLOOD LEFT ARM  Final   Special Requests   Final    BOTTLES DRAWN AEROBIC AND ANAEROBIC Blood Culture adequate volume   Culture NO GROWTH 5 DAYS  Final   Report Status 12/14/2016 FINAL  Final    RADIOLOGY:  No results found.  Follow up with PCP in 1 week.  Management plans discussed with the patient, family and they are in agreement.  CODE STATUS:  Code Status History    Date Active Date Inactive Code Status Order ID Comments User Context   12/13/2016 10:30 PM 12/16/2016  7:39 PM Full Code [id number]  Etta Quill, DO ED   12/09/2016  6:55 PM 12/10/2016  8:03 PM Full Code [id number]  Bettey Costa, MD Inpatient   05/24/2016 12:09 AM 05/25/2016  4:39 PM Full Code [id number]  Edwin Dada, MD Inpatient   05/19/2016  1:23 AM 05/19/2016  5:56 PM Full Code [id number]  Lance Coon, MD Inpatient   09/12/2015 12:29 PM 09/16/2015  4:21 PM Full Code [id number]  Domenic Polite, MD Inpatient   12/28/2014 11:26 PM 01/01/2015  3:03 PM Full Code [id number]  Theressa Millard, MD Inpatient    Advance Directive Documentation     Most Recent Value  Type of Advance Directive  Healthcare Power of Attorney  Pre-existing out of facility DNR order (yellow form or pink MOST form)  -  "MOST" Form in Place?  -      TOTAL TIME TAKING CARE OF THIS PATIENT ON DAY OF DISCHARGE: more than 30 minutes.   Hillary Bow R M.D on 12/20/2016 at 2:31 PM  Between 7am to 6pm - Pager - [phone]  After 6pm go to [email or web address] - password [email or web address] [facility]  Office  [phone]  CC: Primary care physician; Arnoldo Morale,  MD  Note: This dictation was prepared with Dragon dictation along with  smaller phrase technology. Any transcriptional errors that result from this process are unintentional.

## 2016-12-27 ENCOUNTER — Other Ambulatory Visit (HOSPITAL_BASED_OUTPATIENT_CLINIC_OR_DEPARTMENT_OTHER): Payer: Medicaid Other

## 2016-12-27 ENCOUNTER — Ambulatory Visit (HOSPITAL_BASED_OUTPATIENT_CLINIC_OR_DEPARTMENT_OTHER): Payer: Medicaid Other

## 2016-12-27 ENCOUNTER — Encounter: Payer: Self-pay | Admitting: Hematology and Oncology

## 2016-12-27 ENCOUNTER — Ambulatory Visit (HOSPITAL_BASED_OUTPATIENT_CLINIC_OR_DEPARTMENT_OTHER): Payer: Medicaid Other | Admitting: Hematology and Oncology

## 2016-12-27 ENCOUNTER — Ambulatory Visit: Payer: Medicaid Other

## 2016-12-27 VITALS — BP 148/105 | HR 82 | Temp 98.1°F | Resp 20

## 2016-12-27 DIAGNOSIS — D61810 Antineoplastic chemotherapy induced pancytopenia: Secondary | ICD-10-CM

## 2016-12-27 DIAGNOSIS — T451X5A Adverse effect of antineoplastic and immunosuppressive drugs, initial encounter: Secondary | ICD-10-CM

## 2016-12-27 DIAGNOSIS — C9001 Multiple myeloma in remission: Secondary | ICD-10-CM

## 2016-12-27 DIAGNOSIS — Z5112 Encounter for antineoplastic immunotherapy: Secondary | ICD-10-CM

## 2016-12-27 DIAGNOSIS — Z9481 Bone marrow transplant status: Secondary | ICD-10-CM | POA: Diagnosis not present

## 2016-12-27 LAB — COMPREHENSIVE METABOLIC PANEL
ALT: 42 U/L (ref 0–55)
ANION GAP: 9 meq/L (ref 3–11)
AST: 18 U/L (ref 5–34)
Albumin: 3.4 g/dL — ABNORMAL LOW (ref 3.5–5.0)
Alkaline Phosphatase: 43 U/L (ref 40–150)
BUN: 15.1 mg/dL (ref 7.0–26.0)
CHLORIDE: 110 meq/L — AB (ref 98–109)
CO2: 25 mEq/L (ref 22–29)
CREATININE: 1 mg/dL (ref 0.7–1.3)
Calcium: 9.1 mg/dL (ref 8.4–10.4)
Glucose: 99 mg/dl (ref 70–140)
Potassium: 3.9 mEq/L (ref 3.5–5.1)
Sodium: 144 mEq/L (ref 136–145)
Total Bilirubin: 0.23 mg/dL (ref 0.20–1.20)
Total Protein: 6 g/dL — ABNORMAL LOW (ref 6.4–8.3)

## 2016-12-27 LAB — CBC WITH DIFFERENTIAL/PLATELET
BASO%: 0.4 % (ref 0.0–2.0)
BASOS ABS: 0 10*3/uL (ref 0.0–0.1)
EOS ABS: 0 10*3/uL (ref 0.0–0.5)
EOS%: 0.1 % (ref 0.0–7.0)
HEMATOCRIT: 35.8 % — AB (ref 38.4–49.9)
HGB: 12.2 g/dL — ABNORMAL LOW (ref 13.0–17.1)
LYMPH#: 1.4 10*3/uL (ref 0.9–3.3)
LYMPH%: 20.8 % (ref 14.0–49.0)
MCH: 31.8 pg (ref 27.2–33.4)
MCHC: 34.1 g/dL (ref 32.0–36.0)
MCV: 93.3 fL (ref 79.3–98.0)
MONO#: 0.9 10*3/uL (ref 0.1–0.9)
MONO%: 13.1 % (ref 0.0–14.0)
NEUT#: 4.5 10*3/uL (ref 1.5–6.5)
NEUT%: 65.6 % (ref 39.0–75.0)
PLATELETS: 310 10*3/uL (ref 140–400)
RBC: 3.84 10*6/uL — AB (ref 4.20–5.82)
RDW: 15.3 % — ABNORMAL HIGH (ref 11.0–14.6)
WBC: 6.8 10*3/uL (ref 4.0–10.3)

## 2016-12-27 MED ORDER — SODIUM CHLORIDE 0.9 % IV SOLN
15.8000 mg/kg | Freq: Once | INTRAVENOUS | Status: AC
  Administered 2016-12-27: 1200 mg via INTRAVENOUS
  Filled 2016-12-27: qty 60

## 2016-12-27 MED ORDER — FAMOTIDINE IN NACL 20-0.9 MG/50ML-% IV SOLN
INTRAVENOUS | Status: AC
  Filled 2016-12-27: qty 50

## 2016-12-27 MED ORDER — ZOLEDRONIC ACID 4 MG/100ML IV SOLN
4.0000 mg | Freq: Once | INTRAVENOUS | Status: AC
  Administered 2016-12-27: 4 mg via INTRAVENOUS
  Filled 2016-12-27: qty 100

## 2016-12-27 MED ORDER — SODIUM CHLORIDE 0.9 % IV SOLN
20.0000 mg | Freq: Once | INTRAVENOUS | Status: AC
  Administered 2016-12-27: 20 mg via INTRAVENOUS
  Filled 2016-12-27: qty 2

## 2016-12-27 MED ORDER — FAMOTIDINE IN NACL 20-0.9 MG/50ML-% IV SOLN
20.0000 mg | Freq: Once | INTRAVENOUS | Status: AC
  Administered 2016-12-27: 20 mg via INTRAVENOUS

## 2016-12-27 MED ORDER — SODIUM CHLORIDE 0.9% FLUSH
10.0000 mL | INTRAVENOUS | Status: DC | PRN
  Administered 2016-12-27: 10 mL
  Filled 2016-12-27: qty 10

## 2016-12-27 MED ORDER — SODIUM CHLORIDE 0.9 % IV SOLN
Freq: Once | INTRAVENOUS | Status: AC
  Administered 2016-12-27: 09:00:00 via INTRAVENOUS

## 2016-12-27 MED ORDER — ACETAMINOPHEN 325 MG PO TABS
ORAL_TABLET | ORAL | Status: AC
  Filled 2016-12-27: qty 2

## 2016-12-27 MED ORDER — ACETAMINOPHEN 325 MG PO TABS
650.0000 mg | ORAL_TABLET | Freq: Once | ORAL | Status: AC
  Administered 2016-12-27: 650 mg via ORAL

## 2016-12-27 MED ORDER — HEPARIN SOD (PORK) LOCK FLUSH 100 UNIT/ML IV SOLN
500.0000 [IU] | Freq: Once | INTRAVENOUS | Status: AC | PRN
  Administered 2016-12-27: 500 [IU]
  Filled 2016-12-27: qty 5

## 2016-12-27 MED ORDER — PROCHLORPERAZINE MALEATE 10 MG PO TABS: 10.0000 mg | ORAL_TABLET | Freq: Once | ORAL | Status: DC

## 2016-12-27 MED ORDER — SODIUM CHLORIDE 0.9% FLUSH
10.0000 mL | Freq: Once | INTRAVENOUS | Status: AC
  Administered 2016-12-27: 10 mL
  Filled 2016-12-27: qty 10

## 2016-12-27 NOTE — Progress Notes (Signed)
[facility] OFFICE PROGRESS NOTE  Patient Care Team: Arnoldo Morale, MD as PCP - General (Family Medicine) Melburn Hake, Costella Hatcher, MD as Consulting Physician (Hematology and Oncology)  SUMMARY OF ONCOLOGIC HISTORY:   Multiple myeloma in remission [facility])   08/28/2015 Bone Marrow Biopsy    Accession: [id number] biopsy showed 58% myeloma involvement. Cytogenetics was normal and FISH showed Del 13q and t(11;14).       09/07/2015 - 01/22/2016 Chemotherapy    he received 6 cycles of treatment of Velcade and Revlimid with Dex       09/12/2015 - 09/16/2015 Hospital Admission    He was admitted to the hospital with sepsis, treatment was placed on hold and he received one dose of GCSF for leukopenia      01/12/2016 Adverse Reaction    Delay treatment due to neutropenia      01/27/2016 Imaging    ECHO at [facility] showed normal EF      01/27/2016 PET scan    PET scan at [facility] showed innumerable lytic lesions throughout      01/27/2016 Procedure    PFT is within normal limits at [facility]      01/28/2016 Bone Marrow Biopsy    Bone marrow at [facility] showed persistent plasma cell myeloma in a normocellularmarrow (30%) with 20% atypical plasma cells and myeloid hyperplasia.      02/11/2016 - 06/10/2016 Chemotherapy    He is started on salvage Rx with Kyprolis, Cytoxan and dexamethasone      03/24/2016 Imaging    US venous Doppler showed no evidence of deep vein or superficial thrombosis involving the right lower extremity and left lower extremity. No evidence of Baker&'s cyst on the right or left.      05/18/2016 - 05/19/2016 Hospital Admission    He was admitted for chest pain evaluation. Cardiac work-up excluded cardiac events and CT angiogram were negative for PE. It showed esphageal thickening c/w reflux esophagitis likely cause for his chest discomfort      05/23/2016 - 05/25/2016 Hospital Admission    Patient was hospitalized for worsening shortness of breath  and worsening pain. CT angiogram chest negative for pulmonary emboli. Patient had a recent normal 2-D echo and ruled out for MI. Patient noted to be dehydrated. Patient admitted placed on IV fluids. Diuretic discontinued. Patient's gabapentin has been increased per recommendations from prior office visit from patient's oncologist to 600 mg 3 times daily      06/15/2016 Bone Marrow Biopsy    Bone marrow biopsy showed only 2% plasma cell, normal FISH and cytogenetics      07/26/2016 - 07/26/2016 Chemotherapy    He received high dose melphalan at [facility]       07/27/2016 Bone Marrow Transplant    He received autologous stem cell transplant at [facility]      08/05/2016 - 08/10/2016 Hospital Admission    He was admitted to [facility] for management of neutropenic fever and diarrhea. Cultures were negative. Stool culture came back positive for Novovirus and he was managed conservatively.      11/03/2016 Bone Marrow Biopsy    Bone Marrow Flow Cytometry - NO MONOCLONAL B CELL POPULATION IDENTIFIED. - PREDOMINANCE OF T CELLS WITH NON SPECIFIC CHANGES. - SEE NOTE. Diagnosis Comment: Analysis of the lymphoid population shows predominance of T lymphocytes expressing pan T-cell antigens but with relative abundance of CD8 positive cells and reversal of the CD4:CD8 ratio. No significant CD16/56 expression is identified. The T-cell changes  are not considered specific in this setting. B cells represent the minor population with no monoclonality or abnormal phenotype. 4% lambda light chain restricted plasma cells. FISH positive CCND1/IGH 12% of cells.       11/09/2016 PET scan    No FDG avid myeloma is identified. Numerous osseous lytic lesions as above. 2.Hypermetabolic left level III lymph node, likely reactive etiology.  3.Ancillary CT findings as above.      11/29/2016 -  Chemotherapy    He received treatment with daratumumab and Pomalyst       12/13/2016 - 12/16/2016 Hospital Admission    He was  admitted to the hospital for management of dehydration and early sepsis       INTERVAL HISTORY: Please see below for problem oriented charting. He is seen in the treatment room. He has fully recovered from recent side effects of treatment No recent fever or chills His pain remained about the same. His appetite is stable.  REVIEW OF SYSTEMS:   Constitutional: Denies fevers, chills or abnormal weight loss Eyes: Denies blurriness of vision Ears, nose, mouth, throat, and face: Denies mucositis or sore throat Respiratory: Denies cough, dyspnea or wheezes Cardiovascular: Denies palpitation, chest discomfort or lower extremity swelling Gastrointestinal:  Denies nausea, heartburn or change in bowel habits Skin: Denies abnormal skin rashes Lymphatics: Denies new lymphadenopathy or easy bruising Neurological:Denies numbness, tingling or new weaknesses Behavioral/Psych: Mood is stable, no new changes  All other systems were reviewed with the patient and are negative.  I have reviewed the past medical history, past surgical history, social history and family history with the patient and they are unchanged from previous note.  ALLERGIES:  is allergic to benadryl [diphenhydramine]; trazodone and nefazodone; and latex.  MEDICATIONS:  Current Outpatient Prescriptions  Medication Sig Dispense Refill  . acetaminophen (TYLENOL) 500 MG tablet Take 1,000 mg by mouth every 6 (six) hours as needed for fever.    Marland Kitchen acyclovir (ZOVIRAX) 800 MG tablet Take 800 mg by mouth 2 (two) times daily.    Marland Kitchen aluminum-magnesium hydroxide-simethicone (MAALOX) 619-509-32 MG/5ML SUSP Take 30 mLs by mouth every 4 (four) hours as needed.    Marland Kitchen aspirin 81 MG chewable tablet Chew 81 mg by mouth daily.    Marland Kitchen CALCIUM PO Take 1 tablet by mouth daily.    . cholecalciferol (VITAMIN D) 1000 units tablet Take 1,000 Units by mouth daily.     Marland Kitchen dexamethasone (DECADRON) 4 MG tablet Take 5 tablets (20 mg total) by mouth once a week. 60  tablet 9  . folic acid (FOLVITE) 1 MG tablet Take 1 mg by mouth daily.  1  . gabapentin (NEURONTIN) 300 MG capsule Take 2 capsules (600 mg total) by mouth 2 (two) times daily. (Patient taking differently: Take 300-600 mg by mouth 2 (two) times daily. Take 300 mg in the morning and 600 mg at night.) 90 capsule 9  . guaiFENesin-dextromethorphan (ROBITUSSIN DM) 100-10 MG/5ML syrup Take 5 mLs by mouth every 4 (four) hours as needed for cough. 118 mL 0  . lidocaine-prilocaine (EMLA) cream Apply to affected area once 30 g 3  . lisinopril (PRINIVIL,ZESTRIL) 20 MG tablet Take 1 tablet (20 mg total) by mouth daily. 30 tablet 6  . loperamide (IMODIUM) 2 MG capsule Take 2 mg by mouth 4 (four) times daily as needed.    . Multiple Vitamin (MULTIVITAMIN) tablet Take 1 tablet by mouth daily.  1  . ondansetron (ZOFRAN) 8 MG tablet Take 8 mg by mouth every 8 (eight)  hours as needed.    . pantoprazole (PROTONIX) 40 MG tablet Take 1 tablet (40 mg total) by mouth daily. (Patient not taking: Reported on 12/20/2016) 30 tablet 6  . pomalidomide (POMALYST) 2 MG capsule Take 1 capsule (2 mg total) by mouth daily. Take with water on days 1-21. Repeat every 28 days. 21 capsule 0  . prochlorperazine (COMPAZINE) 10 MG tablet Take 10 mg by mouth every 6 (six) hours as needed.    . ranitidine (ZANTAC) 150 MG tablet Take 1 tablet (150 mg total) by mouth at bedtime. 30 tablet 3  . sulfamethoxazole-trimethoprim (BACTRIM DS,SEPTRA DS) 800-160 MG tablet Take 1 tablet by mouth 3 (three) times a week.  1  . traMADol (ULTRAM) 50 MG tablet TAKE 1 TO 2 TABLETS BY MOUTH EVERY 6 HOURS AS NEEDED FOR PAIN 90 tablet 0  . zolpidem (AMBIEN) 10 MG tablet Take 1 tablet (10 mg total) by mouth at bedtime as needed for sleep. 30 tablet 3   No current facility-administered medications for this visit.    Facility-Administered Medications Ordered in Other Visits  Medication Dose Route Frequency Provider Last Rate Last Dose  . prochlorperazine  (COMPAZINE) tablet 10 mg  10 mg Oral Once Alvy Bimler, Anagabriela Jokerst, MD      . sodium chloride flush (NS) 0.9 % injection 10 mL  10 mL Intracatheter PRN Alvy Bimler, Dynastie Knoop, MD   10 mL at 12/27/16 1428    PHYSICAL EXAMINATION: ECOG PERFORMANCE STATUS: 0 - Asymptomatic GENERAL:alert, no distress and comfortable SKIN: skin color, texture, turgor are normal, no rashes or significant lesions EYES: normal, Conjunctiva are pink and non-injected, sclera clear OROPHARYNX:no exudate, no erythema and lips, buccal mucosa, and tongue normal  NECK: supple, thyroid normal size, non-tender, without nodularity LYMPH:  no palpable lymphadenopathy in the cervical, axillary or inguinal LUNGS: clear to auscultation and percussion with normal breathing effort HEART: regular rate & rhythm and no murmurs and no lower extremity edema ABDOMEN:abdomen soft, non-tender and normal bowel sounds Musculoskeletal:no cyanosis of digits and no clubbing  NEURO: alert & oriented x 3 with fluent speech, no focal motor/sensory deficits  LABORATORY DATA:  I have reviewed the data as listed    Component Value Date/Time   NA 144 12/27/2016 0740   K 3.9 12/27/2016 0740   CL 102 12/16/2016 0418   CO2 25 12/27/2016 0740   GLUCOSE 99 12/27/2016 0740   BUN 15.1 12/27/2016 0740   CREATININE 1.0 12/27/2016 0740   CALCIUM 9.1 12/27/2016 0740   PROT 6.0 (L) 12/27/2016 0740   ALBUMIN 3.4 (L) 12/27/2016 0740   AST 18 12/27/2016 0740   ALT 42 12/27/2016 0740   ALKPHOS 43 12/27/2016 0740   BILITOT 0.23 12/27/2016 0740   GFRNONAA >60 12/16/2016 0418   GFRNONAA 71 07/24/2015 1155   GFRAA >60 12/16/2016 0418   GFRAA 82 07/24/2015 1155    No results found for: SPEP, UPEP  Lab Results  Component Value Date   WBC 6.8 12/27/2016   NEUTROABS 4.5 12/27/2016   HGB 12.2 (L) 12/27/2016   HCT 35.8 (L) 12/27/2016   MCV 93.3 12/27/2016   PLT 310 12/27/2016      Chemistry      Component Value Date/Time   NA 144 12/27/2016 0740   K 3.9 12/27/2016  0740   CL 102 12/16/2016 0418   CO2 25 12/27/2016 0740   BUN 15.1 12/27/2016 0740   CREATININE 1.0 12/27/2016 0740      Component Value Date/Time   CALCIUM 9.1 12/27/2016  0740   ALKPHOS 43 12/27/2016 0740   AST 18 12/27/2016 0740   ALT 42 12/27/2016 0740   BILITOT 0.23 12/27/2016 0740       RADIOGRAPHIC STUDIES: I have personally reviewed the radiological images as listed and agreed with the findings in the report. Dg Chest 1 View  Result Date: 12/10/2016 CLINICAL DATA:  61 year old male with fever. EXAM: CHEST 1 VIEW COMPARISON:  Chest radiograph dated 12/09/2016 FINDINGS: Right-sided infusion catheter with tip in stable positioning at the level of the cavoatrial junction. The lungs are clear. There is no pleural effusion or pneumothorax. The cardiac silhouette is within normal limits. A 2.2 x 2.5 cm expansile lytic lesion is again seen in the right clavicle with no significant interval change. No acute fracture. IMPRESSION: 1. No acute cardiopulmonary process. 2. Expansile lytic lesion in the right clavicle as seen on the prior radiograph. No acute fracture. Electronically Signed   By: Anner Crete M.D.   On: 12/10/2016 06:24   Dg Chest 2 View  Result Date: 12/16/2016 CLINICAL DATA:  History of multiple myeloma.  Cough. EXAM: CHEST  2 VIEW COMPARISON:  CT of the chest 12/13/2016, and previous chest radiographs. FINDINGS: Right-sided Port-A-Cath in stable position. Cardiomediastinal silhouette is normal. Mediastinal contours appear intact. There is no evidence of focal airspace consolidation, pleural effusion or pneumothorax. Mild bronchiectasis with central predominance persists. Osseous structures are without acute abnormality. Soft tissues are grossly normal. IMPRESSION: Persistent mild bronchiectasis with central predominance. No evidence of focal airspace consolidation. Electronically Signed   By: Fidela Salisbury M.D.   On: 12/16/2016 12:02   Dg Chest 2 View  Result Date:  12/13/2016 CLINICAL DATA:  Cough for 1 week EXAM: CHEST  2 VIEW COMPARISON:  12/10/2016 FINDINGS: Cardiac shadow is within normal limits. Right-sided chest wall port is noted in satisfactory position. The lungs are well aerated bilaterally. No acute bony abnormality is seen. Stable lytic lesion is noted within the right clavicle. IMPRESSION: No acute abnormality noted. Electronically Signed   By: Inez Catalina M.D.   On: 12/13/2016 17:22   Dg Chest 2 View  Result Date: 12/09/2016 CLINICAL DATA:  Shortness of breath, cough, and chest pressure. Former smoker. History of multiple myeloma on chemotherapy. EXAM: CHEST  2 VIEW COMPARISON:  05/23/2016 FINDINGS: Right jugular Port-A-Cath terminates over the lower SVC, unchanged. Cardiomediastinal silhouette is within normal limits. The lungs are well inflated and clear. No pleural effusion or pneumothorax is identified. A lytic lesion is again seen in the right clavicle, and a lytic lesion and pathologic fracture involving the T9 vertebral body are similar to the prior study, consistent with history of multiple myeloma. IMPRESSION: No active cardiopulmonary disease. Electronically Signed   By: Logan Bores M.D.   On: 12/09/2016 16:44   Ct Angio Chest Pe W And/or Wo Contrast  Result Date: 12/13/2016 CLINICAL DATA:  Shortness of breath. Near syncope. Multiple myeloma. EXAM: CT ANGIOGRAPHY CHEST WITH CONTRAST TECHNIQUE: Multidetector CT imaging of the chest was performed using the standard protocol during bolus administration of intravenous contrast. Multiplanar CT image reconstructions and MIPs were obtained to evaluate the vascular anatomy. CONTRAST:  Reference EMR for dose.  Patient had 2 boluses. COMPARISON:  05/23/2016 FINDINGS: Cardiovascular: Satisfactory opacification of the pulmonary arteries to the segmental level, but sensitivity is randomly diminished by respiratory motion. No evidence of pulmonary embolism. Normal heart size. No pericardial effusion. Coronary  atherosclerosis. Mediastinum/Nodes: Negative for adenopathy. Porta catheter in good position, tip at the upper cavoatrial junction.  Stable patulous appearance of the esophagus. Lungs/Pleura: Advanced diffuse airway thickening with mild atelectasis. No consolidation, edema, effusion, or pneumothorax. Upper Abdomen: No acute finding Musculoskeletal: Diffuse demineralization with numerous lytic lesions in this patient with known multiple myeloma. Pathologic fracture of the T9 body which is chronic; unchanged height loss. Review of the MIP images confirms the above findings. IMPRESSION: 1. No evidence of pulmonary embolism. Sensitivity is moderately degraded by respiratory motion. 2. Advanced bronchitic type airway thickening with mild atelectasis. 3. Multiple myeloma.  No acute osseous finding. 4. Coronary atherosclerosis. Electronically Signed   By: Monte Fantasia M.D.   On: 12/13/2016 20:46    ASSESSMENT & PLAN:  Multiple myeloma in remission ([place]) He has recovered from side effects of treatment I recommend resuming treatment for both Daratumumab and Pomalyst I will see him again next week and recheck blood work I plan to reduce a dose of Pomalyst for future dosing He will continue to take weekly dexamethasone 20 mg once a week along with antimicrobial therapy as directed He will also continue calcium, vitamin D and Zometa for bone health He will also take aspirin for DVT prophylaxis  S/P autologous bone marrow transplantation [facility]) He will continue antimicrobial prophylaxis as directed by his physicians at [place]. We will continue to provide supportive care. He will receive his transplant vaccination at [facility] in June  Antineoplastic chemotherapy induced pancytopenia [facility]) This is likely due to recent treatment. The patient denies recent history of bleeding such as epistaxis, hematuria or hematochezia. He is asymptomatic from the anemia. I will observe for now.  He does not require  transfusion now. I will continue the chemotherapy at current dose without dosage adjustment.  If the anemia gets progressive worse in the future, I might have to delay his treatment or adjust the chemotherapy dose.    No orders of the defined types were placed in this encounter.  All questions were answered. The patient knows to call the clinic with any problems, questions or concerns. No barriers to learning was detected. I spent 15 minutes counseling the patient face to face. The total time spent in the appointment was 20 minutes and more than 50% was on counseling and review of test results     Heath Lark, MD 12/27/2016 5:06 PM

## 2016-12-27 NOTE — Assessment & Plan Note (Signed)
This is likely due to recent treatment. The patient denies recent history of bleeding such as epistaxis, hematuria or hematochezia. He is asymptomatic from the anemia. I will observe for now.  He does not require transfusion now. I will continue the chemotherapy at current dose without dosage adjustment.  If the anemia gets progressive worse in the future, I might have to delay his treatment or adjust the chemotherapy dose.  

## 2016-12-27 NOTE — Assessment & Plan Note (Signed)
He has recovered from side effects of treatment I recommend resuming treatment for both Daratumumab and Pomalyst I will see him again next week and recheck blood work I plan to reduce a dose of Pomalyst for future dosing He will continue to take weekly dexamethasone 20 mg once a week along with antimicrobial therapy as directed He will also continue calcium, vitamin D and Zometa for bone health He will also take aspirin for DVT prophylaxis

## 2016-12-27 NOTE — Patient Instructions (Addendum)
[place] Discharge Instructions for Patients Receiving Chemotherapy  Today you received the following chemotherapy agents: daratumumab (Darzalex).   To help prevent nausea and vomiting after your treatment, we encourage you to take your nausea medication as prescribed by your physician.   If you develop nausea and vomiting that is not controlled by your nausea medication, call the clinic.   BELOW ARE SYMPTOMS THAT SHOULD BE REPORTED IMMEDIATELY:  *FEVER GREATER THAN 100.5 F  *CHILLS WITH OR WITHOUT FEVER  NAUSEA AND VOMITING THAT IS NOT CONTROLLED WITH YOUR NAUSEA MEDICATION  *UNUSUAL SHORTNESS OF BREATH  *UNUSUAL BRUISING OR BLEEDING  TENDERNESS IN MOUTH AND THROAT WITH OR WITHOUT PRESENCE OF ULCERS  *URINARY PROBLEMS  *BOWEL PROBLEMS  UNUSUAL RASH Items with * indicate a potential emergency and should be followed up as soon as possible.  Feel free to call the clinic you have any questions or concerns. The clinic phone number is [phone].  Please show the [place] at check-in to the Emergency Department and triage nurse.

## 2016-12-27 NOTE — Assessment & Plan Note (Signed)
He will continue antimicrobial prophylaxis as directed by his physicians at [place]. We will continue to provide supportive care. He will receive his transplant vaccination at [facility] in June

## 2016-12-27 NOTE — Progress Notes (Signed)
Discussed elevated BP with pt. Okay to tx per Dr. Alvy Bimler. Pt verbalized that steroids caused elevated BP (systolic and diastolic).

## 2016-12-27 NOTE — Progress Notes (Signed)
Ok to proceed per Dr. Alvy Bimler with BP 151/98. Nursing to continue monitoring.

## 2016-12-28 ENCOUNTER — Telehealth: Payer: Self-pay | Admitting: Family Medicine

## 2016-12-28 NOTE — Telephone Encounter (Signed)
Patient called the office asking to speak with PCP regarding making a hospital f/u appt. Pt cannot wait until next week or until June. Pt has cancer. Please follow up.  Thank you.

## 2016-12-30 NOTE — Telephone Encounter (Signed)
Patient will have to keep appointment scheduled. This should have been a face to face request.

## 2017-01-03 ENCOUNTER — Ambulatory Visit (HOSPITAL_BASED_OUTPATIENT_CLINIC_OR_DEPARTMENT_OTHER): Payer: Medicaid Other

## 2017-01-03 ENCOUNTER — Encounter: Payer: Self-pay | Admitting: Hematology and Oncology

## 2017-01-03 ENCOUNTER — Other Ambulatory Visit (HOSPITAL_BASED_OUTPATIENT_CLINIC_OR_DEPARTMENT_OTHER): Payer: Medicaid Other

## 2017-01-03 ENCOUNTER — Ambulatory Visit (HOSPITAL_BASED_OUTPATIENT_CLINIC_OR_DEPARTMENT_OTHER): Payer: Medicaid Other | Admitting: Hematology and Oncology

## 2017-01-03 ENCOUNTER — Ambulatory Visit: Payer: Medicaid Other

## 2017-01-03 VITALS — BP 175/102 | HR 78 | Temp 97.9°F | Resp 20

## 2017-01-03 DIAGNOSIS — Z5112 Encounter for antineoplastic immunotherapy: Secondary | ICD-10-CM | POA: Diagnosis present

## 2017-01-03 DIAGNOSIS — R5381 Other malaise: Secondary | ICD-10-CM | POA: Diagnosis not present

## 2017-01-03 DIAGNOSIS — T451X5A Adverse effect of antineoplastic and immunosuppressive drugs, initial encounter: Secondary | ICD-10-CM

## 2017-01-03 DIAGNOSIS — C9001 Multiple myeloma in remission: Secondary | ICD-10-CM

## 2017-01-03 DIAGNOSIS — D61810 Antineoplastic chemotherapy induced pancytopenia: Secondary | ICD-10-CM | POA: Diagnosis not present

## 2017-01-03 DIAGNOSIS — C9002 Multiple myeloma in relapse: Secondary | ICD-10-CM

## 2017-01-03 DIAGNOSIS — I10 Essential (primary) hypertension: Secondary | ICD-10-CM

## 2017-01-03 LAB — COMPREHENSIVE METABOLIC PANEL
ALBUMIN: 3.7 g/dL (ref 3.5–5.0)
ALT: 35 U/L (ref 0–55)
AST: 16 U/L (ref 5–34)
Alkaline Phosphatase: 46 U/L (ref 40–150)
Anion Gap: 11 mEq/L (ref 3–11)
BUN: 16.1 mg/dL (ref 7.0–26.0)
CHLORIDE: 107 meq/L (ref 98–109)
CO2: 23 meq/L (ref 22–29)
Calcium: 9.5 mg/dL (ref 8.4–10.4)
Creatinine: 1.1 mg/dL (ref 0.7–1.3)
EGFR: 85 mL/min/{1.73_m2} — ABNORMAL LOW (ref >90)
GLUCOSE: 205 mg/dL — AB (ref 70–140)
POTASSIUM: 4.2 meq/L (ref 3.5–5.1)
SODIUM: 141 meq/L (ref 136–145)
Total Bilirubin: 0.47 mg/dL (ref 0.20–1.20)
Total Protein: 6.6 g/dL (ref 6.4–8.3)

## 2017-01-03 LAB — CBC WITH DIFFERENTIAL/PLATELET
BASO%: 0.1 % (ref 0.0–2.0)
BASOS ABS: 0 10*3/uL (ref 0.0–0.1)
EOS%: 0 % (ref 0.0–7.0)
Eosinophils Absolute: 0 10*3/uL (ref 0.0–0.5)
HCT: 36.5 % — ABNORMAL LOW (ref 38.4–49.9)
HGB: 12.5 g/dL — ABNORMAL LOW (ref 13.0–17.1)
LYMPH%: 7.4 % — ABNORMAL LOW (ref 14.0–49.0)
MCH: 32 pg (ref 27.2–33.4)
MCHC: 34.3 g/dL (ref 32.0–36.0)
MCV: 93.3 fL (ref 79.3–98.0)
MONO#: 0.6 10*3/uL (ref 0.1–0.9)
MONO%: 8.4 % (ref 0.0–14.0)
NEUT#: 6 10*3/uL (ref 1.5–6.5)
NEUT%: 84.1 % — ABNORMAL HIGH (ref 39.0–75.0)
Platelets: 181 10*3/uL (ref 140–400)
RBC: 3.91 10*6/uL — AB (ref 4.20–5.82)
RDW: 16.4 % — AB (ref 11.0–14.6)
WBC: 7.1 10*3/uL (ref 4.0–10.3)
lymph#: 0.5 10*3/uL — ABNORMAL LOW (ref 0.9–3.3)

## 2017-01-03 MED ORDER — FAMOTIDINE IN NACL 20-0.9 MG/50ML-% IV SOLN
INTRAVENOUS | Status: AC
  Filled 2017-01-03: qty 50

## 2017-01-03 MED ORDER — ACETAMINOPHEN 325 MG PO TABS
650.0000 mg | ORAL_TABLET | Freq: Once | ORAL | Status: AC
  Administered 2017-01-03: 650 mg via ORAL

## 2017-01-03 MED ORDER — RANITIDINE HCL 150 MG PO TABS: 150.0000 mg | ORAL_TABLET | Freq: Every day | ORAL | 3 refills | Status: DC

## 2017-01-03 MED ORDER — ACETAMINOPHEN 325 MG PO TABS
ORAL_TABLET | ORAL | Status: AC
  Filled 2017-01-03: qty 2

## 2017-01-03 MED ORDER — SODIUM CHLORIDE 0.9 % IV SOLN
15.7000 mg/kg | Freq: Once | INTRAVENOUS | Status: AC
  Administered 2017-01-03: 1200 mg via INTRAVENOUS
  Filled 2017-01-03: qty 60

## 2017-01-03 MED ORDER — SODIUM CHLORIDE 0.9 % IV SOLN
20.0000 mg | Freq: Once | INTRAVENOUS | Status: AC
  Administered 2017-01-03: 20 mg via INTRAVENOUS
  Filled 2017-01-03: qty 2

## 2017-01-03 MED ORDER — PROCHLORPERAZINE MALEATE 10 MG PO TABS
10.0000 mg | ORAL_TABLET | Freq: Once | ORAL | Status: AC
Start: 2017-01-03 — End: 2017-01-03
  Administered 2017-01-03: 10 mg via ORAL

## 2017-01-03 MED ORDER — PROCHLORPERAZINE MALEATE 10 MG PO TABS
ORAL_TABLET | ORAL | Status: AC
  Filled 2017-01-03: qty 1

## 2017-01-03 MED ORDER — SODIUM CHLORIDE 0.9 % IV SOLN
Freq: Once | INTRAVENOUS | Status: AC
  Administered 2017-01-03: 10:00:00 via INTRAVENOUS

## 2017-01-03 MED ORDER — LISINOPRIL 20 MG PO TABS: 20.0000 mg | ORAL_TABLET | Freq: Every day | ORAL | 6 refills | Status: DC

## 2017-01-03 MED ORDER — HEPARIN SOD (PORK) LOCK FLUSH 100 UNIT/ML IV SOLN
500.0000 [IU] | Freq: Once | INTRAVENOUS | Status: AC | PRN
  Administered 2017-01-03: 500 [IU]
  Filled 2017-01-03: qty 5

## 2017-01-03 MED ORDER — HYDROCHLOROTHIAZIDE 25 MG PO TABS
25.0000 mg | ORAL_TABLET | Freq: Every day | ORAL | 9 refills | Status: DC
Start: 2017-01-03 — End: 2017-07-11

## 2017-01-03 MED ORDER — SODIUM CHLORIDE 0.9% FLUSH
10.0000 mL | Freq: Once | INTRAVENOUS | Status: AC
  Administered 2017-01-03: 10 mL
  Filled 2017-01-03: qty 10

## 2017-01-03 MED ORDER — FAMOTIDINE IN NACL 20-0.9 MG/50ML-% IV SOLN
20.0000 mg | Freq: Once | INTRAVENOUS | Status: AC
  Administered 2017-01-03: 20 mg via INTRAVENOUS

## 2017-01-03 MED ORDER — SODIUM CHLORIDE 0.9% FLUSH
10.0000 mL | INTRAVENOUS | Status: DC | PRN
  Administered 2017-01-03: 10 mL
  Filled 2017-01-03: qty 10

## 2017-01-03 NOTE — Progress Notes (Signed)
[place] OFFICE PROGRESS NOTE  Patient Care Team: Arnoldo Morale, MD as PCP - General (Family Medicine) Melburn Hake, Costella Hatcher, MD as Consulting Physician (Hematology and Oncology)  SUMMARY OF ONCOLOGIC HISTORY:   Multiple myeloma in relapse [facility])   08/28/2015 Bone Marrow Biopsy    Accession: [id number] biopsy showed 58% myeloma involvement. Cytogenetics was normal and FISH showed Del 13q and t(11;14).       09/07/2015 - 01/22/2016 Chemotherapy    he received 6 cycles of treatment of Velcade and Revlimid with Dex       09/12/2015 - 09/16/2015 Hospital Admission    He was admitted to the hospital with sepsis, treatment was placed on hold and he received one dose of GCSF for leukopenia      01/12/2016 Adverse Reaction    Delay treatment due to neutropenia      01/27/2016 Imaging    ECHO at [facility] showed normal EF      01/27/2016 PET scan    PET scan at [facility] showed innumerable lytic lesions throughout      01/27/2016 Procedure    PFT is within normal limits at [facility]      01/28/2016 Bone Marrow Biopsy    Bone marrow at [facility] showed persistent plasma cell myeloma in a normocellularmarrow (30%) with 20% atypical plasma cells and myeloid hyperplasia.      02/11/2016 - 06/10/2016 Chemotherapy    He is started on salvage Rx with Kyprolis, Cytoxan and dexamethasone      03/24/2016 Imaging    US venous Doppler showed no evidence of deep vein or superficial thrombosis involving the right lower extremity and left lower extremity. No evidence of Baker&'s cyst on the right or left.      05/18/2016 - 05/19/2016 Hospital Admission    He was admitted for chest pain evaluation. Cardiac work-up excluded cardiac events and CT angiogram were negative for PE. It showed esphageal thickening c/w reflux esophagitis likely cause for his chest discomfort      05/23/2016 - 05/25/2016 Hospital Admission    Patient was hospitalized for worsening shortness of breath and  worsening pain. CT angiogram chest negative for pulmonary emboli. Patient had a recent normal 2-D echo and ruled out for MI. Patient noted to be dehydrated. Patient admitted placed on IV fluids. Diuretic discontinued. Patient's gabapentin has been increased per recommendations from prior office visit from patient's oncologist to 600 mg 3 times daily      06/15/2016 Bone Marrow Biopsy    Bone marrow biopsy showed only 2% plasma cell, normal FISH and cytogenetics      07/26/2016 - 07/26/2016 Chemotherapy    He received high dose melphalan at [facility]       07/27/2016 Bone Marrow Transplant    He received autologous stem cell transplant at [facility]      08/05/2016 - 08/10/2016 Hospital Admission    He was admitted to [facility] for management of neutropenic fever and diarrhea. Cultures were negative. Stool culture came back positive for Novovirus and he was managed conservatively.      11/03/2016 Bone Marrow Biopsy    Bone Marrow Flow Cytometry - NO MONOCLONAL B CELL POPULATION IDENTIFIED. - PREDOMINANCE OF T CELLS WITH NON SPECIFIC CHANGES. - SEE NOTE. Diagnosis Comment: Analysis of the lymphoid population shows predominance of T lymphocytes expressing pan T-cell antigens but with relative abundance of CD8 positive cells and reversal of the CD4:CD8 ratio. No significant CD16/56 expression is identified. The T-cell changes  are not considered specific in this setting. B cells represent the minor population with no monoclonality or abnormal phenotype. 4% lambda light chain restricted plasma cells. FISH positive CCND1/IGH 12% of cells.       11/09/2016 PET scan    No FDG avid myeloma is identified. Numerous osseous lytic lesions as above. 2.Hypermetabolic left level III lymph node, likely reactive etiology.  3.Ancillary CT findings as above.      11/29/2016 -  Chemotherapy    He received treatment with daratumumab and Pomalyst       12/13/2016 - 12/16/2016 Hospital Admission    He was  admitted to the hospital for management of dehydration and early sepsis       Surgery          INTERVAL HISTORY: Please see below for problem oriented charting. He is seen in the infusion room He feels tired all the time He denies bone pain He tolerated resumption of treatment well His blood pressure recently has been high but he is not symptomatic No recent chest pain or dizziness Denies recent nausea vomiting No changes in bowel habits  REVIEW OF SYSTEMS:   Constitutional: Denies fevers, chills or abnormal weight loss Eyes: Denies blurriness of vision Ears, nose, mouth, throat, and face: Denies mucositis or sore throat Respiratory: Denies cough, dyspnea or wheezes Cardiovascular: Denies palpitation, chest discomfort or lower extremity swelling Gastrointestinal:  Denies nausea, heartburn or change in bowel habits Skin: Denies abnormal skin rashes Lymphatics: Denies new lymphadenopathy or easy bruising Neurological:Denies numbness, tingling or new weaknesses Behavioral/Psych: Mood is stable, no new changes  All other systems were reviewed with the patient and are negative.  I have reviewed the past medical history, past surgical history, social history and family history with the patient and they are unchanged from previous note.  ALLERGIES:  is allergic to benadryl [diphenhydramine]; trazodone and nefazodone; and latex.  MEDICATIONS:  Current Outpatient Prescriptions  Medication Sig Dispense Refill  . acetaminophen (TYLENOL) 500 MG tablet Take 1,000 mg by mouth every 6 (six) hours as needed for fever.    Marland Kitchen acyclovir (ZOVIRAX) 800 MG tablet Take 800 mg by mouth 2 (two) times daily.    Marland Kitchen aluminum-magnesium hydroxide-simethicone (MAALOX) 161-096-04 MG/5ML SUSP Take 30 mLs by mouth every 4 (four) hours as needed.    Marland Kitchen aspirin 81 MG chewable tablet Chew 81 mg by mouth daily.    Marland Kitchen CALCIUM PO Take 1 tablet by mouth daily.    . cholecalciferol (VITAMIN D) 1000 units tablet Take  1,000 Units by mouth daily.     Marland Kitchen dexamethasone (DECADRON) 4 MG tablet Take 5 tablets (20 mg total) by mouth once a week. 60 tablet 9  . folic acid (FOLVITE) 1 MG tablet Take 1 mg by mouth daily.  1  . gabapentin (NEURONTIN) 300 MG capsule Take 2 capsules (600 mg total) by mouth 2 (two) times daily. (Patient taking differently: Take 300-600 mg by mouth 2 (two) times daily. Take 300 mg in the morning and 600 mg at night.) 90 capsule 9  . guaiFENesin-dextromethorphan (ROBITUSSIN DM) 100-10 MG/5ML syrup Take 5 mLs by mouth every 4 (four) hours as needed for cough. 118 mL 0  . hydrochlorothiazide (HYDRODIURIL) 25 MG tablet Take 1 tablet (25 mg total) by mouth daily. 30 tablet 9  . lidocaine-prilocaine (EMLA) cream Apply to affected area once 30 g 3  . lisinopril (PRINIVIL,ZESTRIL) 20 MG tablet Take 1 tablet (20 mg total) by mouth daily. 30 tablet 6  .  loperamide (IMODIUM) 2 MG capsule Take 2 mg by mouth 4 (four) times daily as needed.    . Multiple Vitamin (MULTIVITAMIN) tablet Take 1 tablet by mouth daily.  1  . ondansetron (ZOFRAN) 8 MG tablet Take 8 mg by mouth every 8 (eight) hours as needed.    . pantoprazole (PROTONIX) 40 MG tablet Take 1 tablet (40 mg total) by mouth daily. (Patient not taking: Reported on 12/20/2016) 30 tablet 6  . pomalidomide (POMALYST) 2 MG capsule Take 1 capsule (2 mg total) by mouth daily. Take with water on days 1-21. Repeat every 28 days. 21 capsule 0  . prochlorperazine (COMPAZINE) 10 MG tablet Take 10 mg by mouth every 6 (six) hours as needed.    . ranitidine (ZANTAC) 150 MG tablet Take 1 tablet (150 mg total) by mouth at bedtime. 30 tablet 3  . sulfamethoxazole-trimethoprim (BACTRIM DS,SEPTRA DS) 800-160 MG tablet Take 1 tablet by mouth 3 (three) times a week.  1  . traMADol (ULTRAM) 50 MG tablet TAKE 1 TO 2 TABLETS BY MOUTH EVERY 6 HOURS AS NEEDED FOR PAIN 90 tablet 0  . zolpidem (AMBIEN) 10 MG tablet Take 1 tablet (10 mg total) by mouth at bedtime as needed for sleep.  30 tablet 3   No current facility-administered medications for this visit.    Facility-Administered Medications Ordered in Other Visits  Medication Dose Route Frequency Provider Last Rate Last Dose  . heparin lock flush 100 unit/mL  500 Units Intracatheter Once PRN Alvy Bimler, Jasman Pfeifle, MD      . sodium chloride flush (NS) 0.9 % injection 10 mL  10 mL Intracatheter PRN Alvy Bimler, Kaleah Hagemeister, MD        PHYSICAL EXAMINATION: ECOG PERFORMANCE STATUS: 1 - Symptomatic but completely ambulatory GENERAL:alert, no distress and comfortable SKIN: skin color, texture, turgor are normal, no rashes or significant lesions EYES: normal, Conjunctiva are pink and non-injected, sclera clear OROPHARYNX:no exudate, no erythema and lips, buccal mucosa, and tongue normal  NECK: supple, thyroid normal size, non-tender, without nodularity LYMPH:  no palpable lymphadenopathy in the cervical, axillary or inguinal LUNGS: clear to auscultation and percussion with normal breathing effort HEART: regular rate & rhythm and no murmurs and no lower extremity edema ABDOMEN:abdomen soft, non-tender and normal bowel sounds Musculoskeletal:no cyanosis of digits and no clubbing  NEURO: alert & oriented x 3 with fluent speech, no focal motor/sensory deficits  LABORATORY DATA:  I have reviewed the data as listed    Component Value Date/Time   NA 141 01/03/2017 0818   K 4.2 01/03/2017 0818   CL 102 12/16/2016 0418   CO2 23 01/03/2017 0818   GLUCOSE 205 (H) 01/03/2017 0818   BUN 16.1 01/03/2017 0818   CREATININE 1.1 01/03/2017 0818   CALCIUM 9.5 01/03/2017 0818   PROT 6.6 01/03/2017 0818   ALBUMIN 3.7 01/03/2017 0818   AST 16 01/03/2017 0818   ALT 35 01/03/2017 0818   ALKPHOS 46 01/03/2017 0818   BILITOT 0.47 01/03/2017 0818   GFRNONAA >60 12/16/2016 0418   GFRNONAA 71 07/24/2015 1155   GFRAA >60 12/16/2016 0418   GFRAA 82 07/24/2015 1155    No results found for: SPEP, UPEP  Lab Results  Component Value Date   WBC 7.1  01/03/2017   NEUTROABS 6.0 01/03/2017   HGB 12.5 (L) 01/03/2017   HCT 36.5 (L) 01/03/2017   MCV 93.3 01/03/2017   PLT 181 01/03/2017      Chemistry      Component Value Date/Time  NA 141 01/03/2017 0818   K 4.2 01/03/2017 0818   CL 102 12/16/2016 0418   CO2 23 01/03/2017 0818   BUN 16.1 01/03/2017 0818   CREATININE 1.1 01/03/2017 0818      Component Value Date/Time   CALCIUM 9.5 01/03/2017 0818   ALKPHOS 46 01/03/2017 0818   AST 16 01/03/2017 0818   ALT 35 01/03/2017 0818   BILITOT 0.47 01/03/2017 0818       RADIOGRAPHIC STUDIES: I have personally reviewed the radiological images as listed and agreed with the findings in the report. Dg Chest 1 View  Result Date: 12/10/2016 CLINICAL DATA:  61 year old male with fever. EXAM: CHEST 1 VIEW COMPARISON:  Chest radiograph dated 12/09/2016 FINDINGS: Right-sided infusion catheter with tip in stable positioning at the level of the cavoatrial junction. The lungs are clear. There is no pleural effusion or pneumothorax. The cardiac silhouette is within normal limits. A 2.2 x 2.5 cm expansile lytic lesion is again seen in the right clavicle with no significant interval change. No acute fracture. IMPRESSION: 1. No acute cardiopulmonary process. 2. Expansile lytic lesion in the right clavicle as seen on the prior radiograph. No acute fracture. Electronically Signed   By: Anner Crete M.D.   On: 12/10/2016 06:24   Dg Chest 2 View  Result Date: 12/16/2016 CLINICAL DATA:  History of multiple myeloma.  Cough. EXAM: CHEST  2 VIEW COMPARISON:  CT of the chest 12/13/2016, and previous chest radiographs. FINDINGS: Right-sided Port-A-Cath in stable position. Cardiomediastinal silhouette is normal. Mediastinal contours appear intact. There is no evidence of focal airspace consolidation, pleural effusion or pneumothorax. Mild bronchiectasis with central predominance persists. Osseous structures are without acute abnormality. Soft tissues are grossly  normal. IMPRESSION: Persistent mild bronchiectasis with central predominance. No evidence of focal airspace consolidation. Electronically Signed   By: Fidela Salisbury M.D.   On: 12/16/2016 12:02   Dg Chest 2 View  Result Date: 12/13/2016 CLINICAL DATA:  Cough for 1 week EXAM: CHEST  2 VIEW COMPARISON:  12/10/2016 FINDINGS: Cardiac shadow is within normal limits. Right-sided chest wall port is noted in satisfactory position. The lungs are well aerated bilaterally. No acute bony abnormality is seen. Stable lytic lesion is noted within the right clavicle. IMPRESSION: No acute abnormality noted. Electronically Signed   By: Inez Catalina M.D.   On: 12/13/2016 17:22   Dg Chest 2 View  Result Date: 12/09/2016 CLINICAL DATA:  Shortness of breath, cough, and chest pressure. Former smoker. History of multiple myeloma on chemotherapy. EXAM: CHEST  2 VIEW COMPARISON:  05/23/2016 FINDINGS: Right jugular Port-A-Cath terminates over the lower SVC, unchanged. Cardiomediastinal silhouette is within normal limits. The lungs are well inflated and clear. No pleural effusion or pneumothorax is identified. A lytic lesion is again seen in the right clavicle, and a lytic lesion and pathologic fracture involving the T9 vertebral body are similar to the prior study, consistent with history of multiple myeloma. IMPRESSION: No active cardiopulmonary disease. Electronically Signed   By: Logan Bores M.D.   On: 12/09/2016 16:44   Ct Angio Chest Pe W And/or Wo Contrast  Result Date: 12/13/2016 CLINICAL DATA:  Shortness of breath. Near syncope. Multiple myeloma. EXAM: CT ANGIOGRAPHY CHEST WITH CONTRAST TECHNIQUE: Multidetector CT imaging of the chest was performed using the standard protocol during bolus administration of intravenous contrast. Multiplanar CT image reconstructions and MIPs were obtained to evaluate the vascular anatomy. CONTRAST:  Reference EMR for dose.  Patient had 2 boluses. COMPARISON:  05/23/2016 FINDINGS:  Cardiovascular: Satisfactory opacification of the pulmonary arteries to the segmental level, but sensitivity is randomly diminished by respiratory motion. No evidence of pulmonary embolism. Normal heart size. No pericardial effusion. Coronary atherosclerosis. Mediastinum/Nodes: Negative for adenopathy. Porta catheter in good position, tip at the upper cavoatrial junction. Stable patulous appearance of the esophagus. Lungs/Pleura: Advanced diffuse airway thickening with mild atelectasis. No consolidation, edema, effusion, or pneumothorax. Upper Abdomen: No acute finding Musculoskeletal: Diffuse demineralization with numerous lytic lesions in this patient with known multiple myeloma. Pathologic fracture of the T9 body which is chronic; unchanged height loss. Review of the MIP images confirms the above findings. IMPRESSION: 1. No evidence of pulmonary embolism. Sensitivity is moderately degraded by respiratory motion. 2. Advanced bronchitic type airway thickening with mild atelectasis. 3. Multiple myeloma.  No acute osseous finding. 4. Coronary atherosclerosis. Electronically Signed   By: Monte Fantasia M.D.   On: 12/13/2016 20:46    ASSESSMENT & PLAN:  Multiple myeloma in relapse ([place]) He tolerated treatment well with expected side effects of treatment I recommend he continues treatment for in 2 weeks daratumumab and Pomalyst I will see him again for toxicity review I will repeat myeloma panel in his next visit I plan to reduce a dose of Pomalyst for future dosing He will continue to take weekly dexamethasone 20 mg once a week along with antimicrobial therapy as directed If his myeloma panel show improved response to treatment, I will plan to start dexamethasone taper He will also continue calcium, vitamin D and Zometa for bone health He will also take aspirin for DVT prophylaxis  Antineoplastic chemotherapy induced pancytopenia ([place]) This is likely due to recent treatment. The patient denies recent  history of bleeding such as epistaxis, hematuria or hematochezia. He is asymptomatic from the anemia. I will observe for now.  He does not require transfusion now. I will continue the chemotherapy at current dose without dosage adjustment.  If the anemia gets progressive worse in the future, I might have to delay his treatment or adjust the chemotherapy dose.   Essential hypertension He has poorly controlled hypertension I recommend addition of hydrochlorothiazide in addition to lisinopril and amlodipine  Physical deconditioning He has generalized deconditioning. He complained of excessive fatigue I have drawn TSH recently that came back normal I recommend gentle exercise as tolerated   Orders Placed This Encounter  Procedures  . Kappa/lambda light chains    Standing Status:   Standing    Number of Occurrences:   11    Standing Expiration Date:   01/03/2018  . Protein electrophoresis, serum    Standing Status:   Standing    Number of Occurrences:   11    Standing Expiration Date:   01/03/2018   All questions were answered. The patient knows to call the clinic with any problems, questions or concerns. No barriers to learning was detected. I spent 20 minutes counseling the patient face to face. The total time spent in the appointment was 25 minutes and more than 50% was on counseling and review of test results     Heath Lark, MD 01/03/2017 1:37 PM

## 2017-01-03 NOTE — Progress Notes (Signed)
okay to proceed with treatment with B/P 166/103, no new orders at this time, home medications will be adjusted per Dr. Alvy Bimler.  Pt stable at discharge.

## 2017-01-03 NOTE — Assessment & Plan Note (Addendum)
He has generalized deconditioning. He complained of excessive fatigue I have drawn TSH recently that came back normal I recommend gentle exercise as tolerated

## 2017-01-03 NOTE — Assessment & Plan Note (Signed)
He has poorly controlled hypertension I recommend addition of hydrochlorothiazide in addition to lisinopril and amlodipine

## 2017-01-03 NOTE — Assessment & Plan Note (Signed)
He tolerated treatment well with expected side effects of treatment I recommend he continues treatment for in 2 weeks daratumumab and Pomalyst I will see him again for toxicity review I will repeat myeloma panel in his next visit I plan to reduce a dose of Pomalyst for future dosing He will continue to take weekly dexamethasone 20 mg once a week along with antimicrobial therapy as directed If his myeloma panel show improved response to treatment, I will plan to start dexamethasone taper He will also continue calcium, vitamin D and Zometa for bone health He will also take aspirin for DVT prophylaxis

## 2017-01-03 NOTE — Patient Instructions (Signed)

## 2017-01-03 NOTE — Assessment & Plan Note (Signed)
This is likely due to recent treatment. The patient denies recent history of bleeding such as epistaxis, hematuria or hematochezia. He is asymptomatic from the anemia. I will observe for now.  He does not require transfusion now. I will continue the chemotherapy at current dose without dosage adjustment.  If the anemia gets progressive worse in the future, I might have to delay his treatment or adjust the chemotherapy dose.  

## 2017-01-03 NOTE — Patient Instructions (Signed)
[facility] Discharge Instructions for Patients Receiving Chemotherapy  Today you received the following chemotherapy agents Darzalex.  To help prevent nausea and vomiting after your treatment, we encourage you to take your nausea medication as directed.  If you develop nausea and vomiting that is not controlled by your nausea medication, call the clinic.   BELOW ARE SYMPTOMS THAT SHOULD BE REPORTED IMMEDIATELY:  *FEVER GREATER THAN 100.5 F  *CHILLS WITH OR WITHOUT FEVER  NAUSEA AND VOMITING THAT IS NOT CONTROLLED WITH YOUR NAUSEA MEDICATION  *UNUSUAL SHORTNESS OF BREATH  *UNUSUAL BRUISING OR BLEEDING  TENDERNESS IN MOUTH AND THROAT WITH OR WITHOUT PRESENCE OF ULCERS  *URINARY PROBLEMS  *BOWEL PROBLEMS  UNUSUAL RASH Items with * indicate a potential emergency and should be followed up as soon as possible.  Feel free to call the clinic you have any questions or concerns. The clinic phone number is [phone].  Please show the CHEMO ALERT CARD at check-in to the Emergency Department and triage nurse.    

## 2017-01-05 ENCOUNTER — Ambulatory Visit: Payer: Medicaid Other | Attending: Family Medicine | Admitting: Family Medicine

## 2017-01-05 ENCOUNTER — Encounter: Payer: Self-pay | Admitting: Family Medicine

## 2017-01-05 VITALS — BP 130/82 | HR 90 | Temp 98.3°F | Resp 18 | Ht 66.0 in | Wt 169.2 lb

## 2017-01-05 DIAGNOSIS — N4830 Priapism, unspecified: Secondary | ICD-10-CM | POA: Insufficient documentation

## 2017-01-05 DIAGNOSIS — Z9889 Other specified postprocedural states: Secondary | ICD-10-CM | POA: Diagnosis not present

## 2017-01-05 DIAGNOSIS — Z888 Allergy status to other drugs, medicaments and biological substances status: Secondary | ICD-10-CM | POA: Insufficient documentation

## 2017-01-05 DIAGNOSIS — Z9484 Stem cells transplant status: Secondary | ICD-10-CM | POA: Diagnosis not present

## 2017-01-05 DIAGNOSIS — I10 Essential (primary) hypertension: Secondary | ICD-10-CM | POA: Insufficient documentation

## 2017-01-05 DIAGNOSIS — C9002 Multiple myeloma in relapse: Secondary | ICD-10-CM

## 2017-01-05 DIAGNOSIS — X58XXXA Exposure to other specified factors, initial encounter: Secondary | ICD-10-CM | POA: Diagnosis not present

## 2017-01-05 DIAGNOSIS — K219 Gastro-esophageal reflux disease without esophagitis: Secondary | ICD-10-CM | POA: Insufficient documentation

## 2017-01-05 DIAGNOSIS — J40 Bronchitis, not specified as acute or chronic: Secondary | ICD-10-CM | POA: Diagnosis not present

## 2017-01-05 DIAGNOSIS — Z794 Long term (current) use of insulin: Secondary | ICD-10-CM | POA: Diagnosis not present

## 2017-01-05 DIAGNOSIS — T380X5A Adverse effect of glucocorticoids and synthetic analogues, initial encounter: Secondary | ICD-10-CM | POA: Diagnosis not present

## 2017-01-05 DIAGNOSIS — L608 Other nail disorders: Secondary | ICD-10-CM

## 2017-01-05 DIAGNOSIS — Z91040 Latex allergy status: Secondary | ICD-10-CM | POA: Insufficient documentation

## 2017-01-05 DIAGNOSIS — G620 Drug-induced polyneuropathy: Secondary | ICD-10-CM | POA: Diagnosis not present

## 2017-01-05 DIAGNOSIS — Z7982 Long term (current) use of aspirin: Secondary | ICD-10-CM | POA: Diagnosis not present

## 2017-01-05 DIAGNOSIS — R7303 Prediabetes: Secondary | ICD-10-CM | POA: Diagnosis not present

## 2017-01-05 MED ORDER — METFORMIN HCL 500 MG PO TABS: 500.0000 mg | ORAL_TABLET | Freq: Every day | ORAL | 5 refills | Status: DC

## 2017-01-05 NOTE — Progress Notes (Signed)
Patient is here for HFU  Patient denies pain at this time.  Patient has taken medication today. Patient has eaten today. 

## 2017-01-05 NOTE — Progress Notes (Signed)
Subjective:  Patient ID: Erik Nguyen, male    DOB: [birth year]  Age: 61 y.o. MRN: [id number]  CC: Hospitalization Follow-up (cough)   HPI Erik Nguyen is a  61 year old male with a history of hypertension, multiple myeloma with current relapse(status post autologous stem cell transplant in 07/2016) who presents today for a follow-up visit. Also has chemotherapy-induced neuropathy and remains on a PPI for GERD.  Hospitalized at [facility] from 12/13/16 through 12/16/16 for acute bronchitis after he presented with cough, fever and questionable syncope and was found to be tachycardic, febrile, elevated lactate, neutropenic with a WBC of 2.8 . Respiratory virus panel was positive for metapneumovirus, blood cultures, sputum cultures were negative. Chest x-ray revealed mild bronchiectasis. He received nebulizer treatment, prednisone and IV antibiotics. He did have hyperglycemia, an A1c of 6.0 and was maintained on sliding scale insulin. Of note prior to this hospitalization he was admitted at [facility] for similar symptoms where he received Zosyn, vancomycin and steroids and was subsequently discharged on Levaquin and steroids. His condition improved and he was subsequently discharged.  He presents today denying cough but does have shortness of breath on moderate exertion and has been advised by his oncologist to commence mild exercise daily. His last appointment with oncology was 2 days ago at which time hydrochlorothiazide has been hydrated for optimal blood pressure control. Denies syncope and informs me that prior to his hospitalization he did fall but did not lose consciousness and there was a question of vasovagal syncope.  He complains of discoloration of his left big toenail of recent onset done is unable to recall a history of trauma.  Past Medical History:  Diagnosis Date  . Encounter for antineoplastic chemotherapy 02/09/2016  . History of syphilis  02/24/2016  . Hypertension Dx 2016  . Multiple myeloma not having achieved remission ([place]) 09/01/2015  . Priapism 2014     Past Surgical History:  Procedure Laterality Date  . COLONOSCOPY    . POLYPECTOMY      Allergies  Allergen Reactions  . Benadryl [Diphenhydramine] Other (See Comments)    priapism  . Trazodone And Nefazodone     priapism  . Latex Itching and Rash     Outpatient Medications Prior to Visit  Medication Sig Dispense Refill  . acetaminophen (TYLENOL) 500 MG tablet Take 1,000 mg by mouth every 6 (six) hours as needed for fever.    Marland Kitchen acyclovir (ZOVIRAX) 800 MG tablet Take 800 mg by mouth 2 (two) times daily.    Marland Kitchen aluminum-magnesium hydroxide-simethicone (MAALOX) 992-426-83 MG/5ML SUSP Take 30 mLs by mouth every 4 (four) hours as needed.    Marland Kitchen aspirin 81 MG chewable tablet Chew 81 mg by mouth daily.    Marland Kitchen CALCIUM PO Take 1 tablet by mouth daily.    . cholecalciferol (VITAMIN D) 1000 units tablet Take 1,000 Units by mouth daily.     Marland Kitchen dexamethasone (DECADRON) 4 MG tablet Take 5 tablets (20 mg total) by mouth once a week. 60 tablet 9  . folic acid (FOLVITE) 1 MG tablet Take 1 mg by mouth daily.  1  . gabapentin (NEURONTIN) 300 MG capsule Take 2 capsules (600 mg total) by mouth 2 (two) times daily. (Patient taking differently: Take 300-600 mg by mouth 2 (two) times daily. Take 300 mg in the morning and 600 mg at night.) 90 capsule 9  . guaiFENesin-dextromethorphan (ROBITUSSIN DM) 100-10 MG/5ML syrup Take 5 mLs by mouth every 4 (four) hours as needed for  cough. 118 mL 0  . hydrochlorothiazide (HYDRODIURIL) 25 MG tablet Take 1 tablet (25 mg total) by mouth daily. 30 tablet 9  . lidocaine-prilocaine (EMLA) cream Apply to affected area once 30 g 3  . lisinopril (PRINIVIL,ZESTRIL) 20 MG tablet Take 1 tablet (20 mg total) by mouth daily. 30 tablet 6  . loperamide (IMODIUM) 2 MG capsule Take 2 mg by mouth 4 (four) times daily as needed.    . Multiple Vitamin (MULTIVITAMIN) tablet  Take 1 tablet by mouth daily.  1  . ondansetron (ZOFRAN) 8 MG tablet Take 8 mg by mouth every 8 (eight) hours as needed.    . pomalidomide (POMALYST) 2 MG capsule Take 1 capsule (2 mg total) by mouth daily. Take with water on days 1-21. Repeat every 28 days. 21 capsule 0  . prochlorperazine (COMPAZINE) 10 MG tablet Take 10 mg by mouth every 6 (six) hours as needed.    . ranitidine (ZANTAC) 150 MG tablet Take 1 tablet (150 mg total) by mouth at bedtime. 30 tablet 3  . sulfamethoxazole-trimethoprim (BACTRIM DS,SEPTRA DS) 800-160 MG tablet Take 1 tablet by mouth 3 (three) times a week.  1  . traMADol (ULTRAM) 50 MG tablet TAKE 1 TO 2 TABLETS BY MOUTH EVERY 6 HOURS AS NEEDED FOR PAIN 90 tablet 0  . pantoprazole (PROTONIX) 40 MG tablet Take 1 tablet (40 mg total) by mouth daily. (Patient not taking: Reported on 12/20/2016) 30 tablet 6  . zolpidem (AMBIEN) 10 MG tablet Take 1 tablet (10 mg total) by mouth at bedtime as needed for sleep. 30 tablet 3   No facility-administered medications prior to visit.     ROS Review of Systems  Constitutional: Negative for activity change and appetite change.  HENT: Negative for sinus pressure and sore throat.   Eyes: Negative for visual disturbance.  Respiratory: Positive for shortness of breath (on moderate exertion). Negative for cough and chest tightness.   Cardiovascular: Negative for chest pain and leg swelling.  Gastrointestinal: Negative for abdominal distention, abdominal pain, constipation and diarrhea.  Endocrine: Negative.   Genitourinary: Negative for dysuria.  Musculoskeletal: Negative for joint swelling and myalgias.  Skin: Negative for rash.  Allergic/Immunologic: Negative.   Neurological: Negative for weakness, light-headedness and numbness.  Psychiatric/Behavioral: Negative for dysphoric mood and suicidal ideas.    Objective:  BP 130/82 (BP Location: Left Arm, Patient Position: Sitting, Cuff Size: Normal)   Pulse 90   Temp 98.3 F (36.8  C) (Oral)   Resp 18   Ht 5' 6"  (1.676 m)   Wt 169 lb 3.2 oz (76.7 kg)   SpO2 99%   BMI 27.31 kg/m   BP/Weight 01/05/2017 01/03/2017 09/08/5807  Systolic BP 983 382 505  Diastolic BP 82 397 673  Wt. (Lbs) 169.2 - -  BMI 27.31 - -      Physical Exam  Constitutional: He is oriented to person, place, and time. He appears well-developed and well-nourished.  Cardiovascular: Normal rate, normal heart sounds and intact distal pulses.   No murmur heard. Pulmonary/Chest: Effort normal and breath sounds normal. He has no wheezes. He has no rales. He exhibits no tenderness.  Abdominal: Soft. Bowel sounds are normal. He exhibits no distension and no mass. There is no tenderness.  Musculoskeletal: Normal range of motion.  Neurological: He is alert and oriented to person, place, and time.  Skin: Skin is warm and dry.  Dark discoloration beneath left big toenail  Psychiatric: He has a normal mood and affect.  Assessment & Plan:   1. Multiple myeloma in relapse [facility]) Status post autologous bone marrow transplant Continue Daratumumab and Pomalyst as well as Decadron Follow-up with oncology  2. Essential hypertension Controlled Continue lisinopril and hydrochlorothiazide  3. Bronchitis Resolved  4. Prediabetes Steroid-induced I will commence metformin Discussed dietary modifications and lifestyle modifications - metFORMIN (GLUCOPHAGE) 500 MG tablet; Take 1 tablet (500 mg total) by mouth daily with breakfast.  Dispense: 30 tablet; Refill: 5  5. Discoloration of nail Suspicious from bruising secondary to trauma   Meds ordered this encounter  Medications  . metFORMIN (GLUCOPHAGE) 500 MG tablet    Sig: Take 1 tablet (500 mg total) by mouth daily with breakfast.    Dispense:  30 tablet    Refill:  5    Follow-up: Return in about 3 months (around 04/07/2017) for Follow-up on chronic medical conditions.   Arnoldo Morale MD

## 2017-01-05 NOTE — Patient Instructions (Signed)
Prediabetes Prediabetes is the condition of having a blood sugar (blood glucose) level that is higher than it should be, but not high enough for you to be diagnosed with type 2 diabetes. Having prediabetes puts you at risk for developing type 2 diabetes (type 2 diabetes mellitus). Prediabetes may be called impaired glucose tolerance or impaired fasting glucose. Prediabetes usually does not cause symptoms. Your health care provider can diagnose this condition with blood tests. You may be tested for prediabetes if you are overweight and if you have at least one other risk factor for prediabetes. Risk factors for prediabetes include:  Having a family member with type 2 diabetes.  Being overweight or obese.  Being older than age 77.  Being of American-Indian, African-American, Hispanic/Latino, or Asian/Pacific Islander descent.  Having an inactive (sedentary) lifestyle.  Having a history of gestational diabetes or polycystic ovarian syndrome (PCOS).  Having low levels of good cholesterol (HDL-C) or high levels of blood fats (triglycerides).  Having high blood pressure. What is blood glucose and how is blood glucose measured?   Blood glucose refers to the amount of glucose in your bloodstream. Glucose comes from eating foods that contain sugars and starches (carbohydrates) that the body breaks down into glucose. Your blood glucose level may be measured in mg/dL (milligrams per deciliter) or mmol/L (millimoles per liter).Your blood glucose may be checked with one or more of the following blood tests:  A fasting blood glucose (FBG) test. You will not be allowed to eat (you will fast) for at least 8 hours before a blood sample is taken.  A normal range for FBG is 70-100 mg/dl (3.9-5.6 mmol/L).  An A1c (hemoglobin A1c) blood test. This test provides information about blood glucose control over the previous 2?63months.  An oral glucose tolerance test (OGTT). This test measures your blood glucose  twice:  After fasting. This is your baseline level.  Two hours after you drink a beverage that contains glucose. You may be diagnosed with prediabetes:  If your FBG is 100?125 mg/dL (5.6-6.9 mmol/L).  If your A1c level is 5.7?6.4%.  If your OGGT result is 140?199 mg/dL (7.8-11 mmol/L). These blood tests may be repeated to confirm your diagnosis. What happens if blood glucose is too high? The pancreas produces a hormone (insulin) that helps move glucose from the bloodstream into cells. When cells in the body do not respond properly to insulin that the body makes (insulin resistance), excess glucose builds up in the blood instead of going into cells. As a result, high blood glucose (hyperglycemia) can develop, which can cause many complications. This is a symptom of prediabetes. What can happen if blood glucose stays higher than normal for a long time? Having high blood glucose for a long time is dangerous. Too much glucose in your blood can damage your nerves and blood vessels. Long-term damage can lead to complications from diabetes, which may include:  Heart disease.  Stroke.  Blindness.  Kidney disease.  Depression.  Poor circulation in the feet and legs, which could lead to surgical removal (amputation) in severe cases. How can prediabetes be prevented from turning into type 2 diabetes?   To help prevent type 2 diabetes, take the following actions:  Be physically active.  Do moderate-intensity physical activity for at least 30 minutes on at least 5 days of the week, or as much as told by your health care provider. This could be brisk walking, biking, or water aerobics.  Ask your health care provider what activities are  safe for you. A mix of physical activities may be best, such as walking, swimming, cycling, and strength training.  Lose weight as told by your health care provider.  Losing 5-7% of your body weight can reverse insulin resistance.  Your health care  provider can determine how much weight loss is best for you and can help you lose weight safely.  Follow a healthy meal plan. This includes eating lean proteins, complex carbohydrates, fresh fruits and vegetables, low-fat dairy products, and healthy fats.  Follow instructions from your health care provider about eating or drinking restrictions.  Make an appointment to see a diet and nutrition specialist (registered dietitian) to help you create a healthy eating plan that is right for you.  Do not smoke or use any tobacco products, such as cigarettes, chewing tobacco, and e-cigarettes. If you need help quitting, ask your health care provider.  Take over-the-counter and prescription medicines as told by your health care provider. You may be prescribed medicines that help lower the risk of type 2 diabetes. This information is not intended to replace advice given to you by your health care provider. Make sure you discuss any questions you have with your health care provider. Document Released: 11/23/2015 Document Revised: 01/07/2016 Document Reviewed: 09/22/2015 Elsevier Interactive Patient Education  2017 [employer].

## 2017-01-10 ENCOUNTER — Ambulatory Visit: Payer: Medicaid Other

## 2017-01-10 ENCOUNTER — Telehealth: Payer: Self-pay | Admitting: Hematology and Oncology

## 2017-01-10 ENCOUNTER — Other Ambulatory Visit: Payer: Self-pay | Admitting: Hematology and Oncology

## 2017-01-10 ENCOUNTER — Other Ambulatory Visit (HOSPITAL_BASED_OUTPATIENT_CLINIC_OR_DEPARTMENT_OTHER): Payer: Medicaid Other

## 2017-01-10 ENCOUNTER — Ambulatory Visit (HOSPITAL_BASED_OUTPATIENT_CLINIC_OR_DEPARTMENT_OTHER): Payer: Medicaid Other

## 2017-01-10 VITALS — BP 138/80 | HR 72 | Temp 97.8°F | Resp 18

## 2017-01-10 DIAGNOSIS — C9002 Multiple myeloma in relapse: Secondary | ICD-10-CM | POA: Diagnosis not present

## 2017-01-10 DIAGNOSIS — C9001 Multiple myeloma in remission: Secondary | ICD-10-CM

## 2017-01-10 DIAGNOSIS — Z5112 Encounter for antineoplastic immunotherapy: Secondary | ICD-10-CM | POA: Diagnosis present

## 2017-01-10 LAB — COMPREHENSIVE METABOLIC PANEL
ALBUMIN: 3.6 g/dL (ref 3.5–5.0)
ALK PHOS: 58 U/L (ref 40–150)
ALT: 106 U/L — AB (ref 0–55)
AST: 38 U/L — ABNORMAL HIGH (ref 5–34)
Anion Gap: 13 mEq/L — ABNORMAL HIGH (ref 3–11)
BUN: 27 mg/dL — ABNORMAL HIGH (ref 7.0–26.0)
CO2: 19 meq/L — AB (ref 22–29)
CREATININE: 1.4 mg/dL — AB (ref 0.7–1.3)
Calcium: 9.5 mg/dL (ref 8.4–10.4)
Chloride: 109 mEq/L (ref 98–109)
EGFR: 65 mL/min/{1.73_m2} — ABNORMAL LOW (ref >90)
GLUCOSE: 373 mg/dL — AB (ref 70–140)
Potassium: 4.1 mEq/L (ref 3.5–5.1)
SODIUM: 141 meq/L (ref 136–145)
Total Bilirubin: 0.33 mg/dL (ref 0.20–1.20)
Total Protein: 6.8 g/dL (ref 6.4–8.3)

## 2017-01-10 LAB — CBC WITH DIFFERENTIAL/PLATELET
BASO%: 0 % (ref 0.0–2.0)
BASOS ABS: 0 10*3/uL (ref 0.0–0.1)
EOS%: 0.2 % (ref 0.0–7.0)
Eosinophils Absolute: 0 10*3/uL (ref 0.0–0.5)
HCT: 38.5 % (ref 38.4–49.9)
HGB: 13.1 g/dL (ref 13.0–17.1)
LYMPH%: 18.5 % (ref 14.0–49.0)
MCH: 31.3 pg (ref 27.2–33.4)
MCHC: 34 g/dL (ref 32.0–36.0)
MCV: 91.9 fL (ref 79.3–98.0)
MONO#: 0.8 10*3/uL (ref 0.1–0.9)
MONO%: 14.1 % — AB (ref 0.0–14.0)
NEUT#: 3.8 10*3/uL (ref 1.5–6.5)
NEUT%: 67.2 % (ref 39.0–75.0)
NRBC: 0 % (ref 0–0)
Platelets: 151 10*3/uL (ref 140–400)
RBC: 4.19 10*6/uL — AB (ref 4.20–5.82)
RDW: 16 % — AB (ref 11.0–14.6)
WBC: 5.7 10*3/uL (ref 4.0–10.3)
lymph#: 1.1 10*3/uL (ref 0.9–3.3)

## 2017-01-10 MED ORDER — HEPARIN SOD (PORK) LOCK FLUSH 100 UNIT/ML IV SOLN
500.0000 [IU] | Freq: Once | INTRAVENOUS | Status: AC | PRN
  Administered 2017-01-10: 500 [IU]
  Filled 2017-01-10: qty 5

## 2017-01-10 MED ORDER — FAMOTIDINE IN NACL 20-0.9 MG/50ML-% IV SOLN
20.0000 mg | Freq: Once | INTRAVENOUS | Status: AC
  Administered 2017-01-10: 20 mg via INTRAVENOUS

## 2017-01-10 MED ORDER — SODIUM CHLORIDE 0.9% FLUSH
10.0000 mL | Freq: Once | INTRAVENOUS | Status: AC
  Administered 2017-01-10: 10 mL
  Filled 2017-01-10: qty 10

## 2017-01-10 MED ORDER — FAMOTIDINE IN NACL 20-0.9 MG/50ML-% IV SOLN
INTRAVENOUS | Status: AC
  Filled 2017-01-10: qty 50

## 2017-01-10 MED ORDER — SODIUM CHLORIDE 0.9 % IV SOLN
15.7000 mg/kg | Freq: Once | INTRAVENOUS | Status: AC
  Administered 2017-01-10: 1200 mg via INTRAVENOUS
  Filled 2017-01-10: qty 60

## 2017-01-10 MED ORDER — SODIUM CHLORIDE 0.9 % IV SOLN
Freq: Once | INTRAVENOUS | Status: AC
  Administered 2017-01-10: 09:00:00 via INTRAVENOUS

## 2017-01-10 MED ORDER — SODIUM CHLORIDE 0.9% FLUSH
10.0000 mL | INTRAVENOUS | Status: DC | PRN
  Administered 2017-01-10: 10 mL
  Filled 2017-01-10: qty 10

## 2017-01-10 MED ORDER — PROCHLORPERAZINE MALEATE 10 MG PO TABS
ORAL_TABLET | ORAL | Status: AC
Start: 2017-01-10 — End: 2017-01-10
  Filled 2017-01-10: qty 1

## 2017-01-10 MED ORDER — SODIUM CHLORIDE 0.9 % IV SOLN
20.0000 mg | Freq: Once | INTRAVENOUS | Status: AC
  Administered 2017-01-10: 20 mg via INTRAVENOUS
  Filled 2017-01-10: qty 2

## 2017-01-10 MED ORDER — ACETAMINOPHEN 325 MG PO TABS
ORAL_TABLET | ORAL | Status: AC
  Filled 2017-01-10: qty 2

## 2017-01-10 MED ORDER — PROCHLORPERAZINE MALEATE 10 MG PO TABS: 10.0000 mg | ORAL_TABLET | Freq: Once | ORAL | Status: DC

## 2017-01-10 MED ORDER — ACETAMINOPHEN 325 MG PO TABS
650.0000 mg | ORAL_TABLET | Freq: Once | ORAL | Status: AC
  Administered 2017-01-10: 650 mg via ORAL

## 2017-01-10 NOTE — Patient Instructions (Signed)

## 2017-01-10 NOTE — Telephone Encounter (Signed)
I reviewed the blood work with the patient. He is noted to have hyperglycemia, mildly elevated creatinine and liver enzymes I recommend holding Pomalyst for 1 week I have given him some written instruction to also start reducing dexamethasone slowly every week Per patient request, I have placed order to get sedated bone marrow biopsy to be done on 01/25/2017

## 2017-01-11 LAB — KAPPA/LAMBDA LIGHT CHAINS
IG KAPPA FREE LIGHT CHAIN: 1.2 mg/L — AB (ref 3.3–19.4)
IG LAMBDA FREE LIGHT CHAIN: 3 mg/L — AB (ref 5.7–26.3)
Kappa/Lambda FluidC Ratio: 0.4 (ref 0.26–1.65)

## 2017-01-12 LAB — PROTEIN ELECTROPHORESIS, SERUM
A/G Ratio: 1.2 (ref 0.7–1.7)
ALBUMIN: 3.4 g/dL (ref 2.9–4.4)
Alpha 1: 0.3 g/dL (ref 0.0–0.4)
Alpha 2: 1.1 g/dL — ABNORMAL HIGH (ref 0.4–1.0)
Beta: 1.1 g/dL (ref 0.7–1.3)
GLOBULIN, TOTAL: 2.9 g/dL (ref 2.2–3.9)
Gamma Globulin: 0.4 g/dL (ref 0.4–1.8)
M-Spike, %: 0.1 g/dL — ABNORMAL HIGH
Total Protein: 6.3 g/dL (ref 6.0–8.5)

## 2017-01-16 ENCOUNTER — Other Ambulatory Visit: Payer: Self-pay | Admitting: *Deleted

## 2017-01-16 MED ORDER — POMALIDOMIDE 2 MG PO CAPS: 2.0000 mg | ORAL_CAPSULE | Freq: Every day | ORAL | 0 refills | Status: DC

## 2017-01-17 ENCOUNTER — Other Ambulatory Visit (HOSPITAL_BASED_OUTPATIENT_CLINIC_OR_DEPARTMENT_OTHER): Payer: Medicaid Other

## 2017-01-17 ENCOUNTER — Other Ambulatory Visit: Payer: Self-pay | Admitting: Hematology and Oncology

## 2017-01-17 ENCOUNTER — Ambulatory Visit (HOSPITAL_BASED_OUTPATIENT_CLINIC_OR_DEPARTMENT_OTHER): Payer: Medicaid Other

## 2017-01-17 ENCOUNTER — Ambulatory Visit (HOSPITAL_BASED_OUTPATIENT_CLINIC_OR_DEPARTMENT_OTHER): Payer: Medicaid Other | Admitting: Hematology and Oncology

## 2017-01-17 ENCOUNTER — Ambulatory Visit: Payer: Medicaid Other

## 2017-01-17 ENCOUNTER — Inpatient Hospital Stay: Payer: Medicaid Other | Admitting: Family Medicine

## 2017-01-17 ENCOUNTER — Telehealth: Payer: Self-pay | Admitting: Hematology and Oncology

## 2017-01-17 VITALS — BP 118/73 | HR 93 | Temp 98.1°F | Resp 17

## 2017-01-17 DIAGNOSIS — T451X5A Adverse effect of antineoplastic and immunosuppressive drugs, initial encounter: Secondary | ICD-10-CM

## 2017-01-17 DIAGNOSIS — Z5112 Encounter for antineoplastic immunotherapy: Secondary | ICD-10-CM | POA: Diagnosis not present

## 2017-01-17 DIAGNOSIS — D61810 Antineoplastic chemotherapy induced pancytopenia: Secondary | ICD-10-CM | POA: Diagnosis not present

## 2017-01-17 DIAGNOSIS — C9002 Multiple myeloma in relapse: Secondary | ICD-10-CM | POA: Diagnosis not present

## 2017-01-17 DIAGNOSIS — I10 Essential (primary) hypertension: Secondary | ICD-10-CM

## 2017-01-17 DIAGNOSIS — R739 Hyperglycemia, unspecified: Secondary | ICD-10-CM

## 2017-01-17 DIAGNOSIS — C9001 Multiple myeloma in remission: Secondary | ICD-10-CM

## 2017-01-17 LAB — COMPREHENSIVE METABOLIC PANEL
ALBUMIN: 3.6 g/dL (ref 3.5–5.0)
ALK PHOS: 50 U/L (ref 40–150)
ALT: 34 U/L (ref 0–55)
AST: 11 U/L (ref 5–34)
Anion Gap: 11 mEq/L (ref 3–11)
BILIRUBIN TOTAL: 0.43 mg/dL (ref 0.20–1.20)
BUN: 30.1 mg/dL — AB (ref 7.0–26.0)
CALCIUM: 9.1 mg/dL (ref 8.4–10.4)
CO2: 23 mEq/L (ref 22–29)
Chloride: 104 mEq/L (ref 98–109)
Creatinine: 1.1 mg/dL (ref 0.7–1.3)
EGFR: 83 mL/min/{1.73_m2} — AB (ref >90)
GLUCOSE: 188 mg/dL — AB (ref 70–140)
POTASSIUM: 4 meq/L (ref 3.5–5.1)
Sodium: 138 mEq/L (ref 136–145)
Total Protein: 6.6 g/dL (ref 6.4–8.3)

## 2017-01-17 LAB — CBC WITH DIFFERENTIAL/PLATELET
BASO%: 0 % (ref 0.0–2.0)
BASOS ABS: 0 10*3/uL (ref 0.0–0.1)
EOS ABS: 0 10*3/uL (ref 0.0–0.5)
EOS%: 0 % (ref 0.0–7.0)
HEMATOCRIT: 36.8 % — AB (ref 38.4–49.9)
HEMOGLOBIN: 12.6 g/dL — AB (ref 13.0–17.1)
LYMPH#: 1.3 10*3/uL (ref 0.9–3.3)
LYMPH%: 35.7 % (ref 14.0–49.0)
MCH: 31.3 pg (ref 27.2–33.4)
MCHC: 34.2 g/dL (ref 32.0–36.0)
MCV: 91.5 fL (ref 79.3–98.0)
MONO#: 0.9 10*3/uL (ref 0.1–0.9)
MONO%: 24.7 % — ABNORMAL HIGH (ref 0.0–14.0)
NEUT#: 1.4 10*3/uL — ABNORMAL LOW (ref 1.5–6.5)
NEUT%: 39.6 % (ref 39.0–75.0)
PLATELETS: 232 10*3/uL (ref 140–400)
RBC: 4.02 10*6/uL — ABNORMAL LOW (ref 4.20–5.82)
RDW: 15.6 % — AB (ref 11.0–14.6)
WBC: 3.6 10*3/uL — AB (ref 4.0–10.3)

## 2017-01-17 MED ORDER — FOLIC ACID 1 MG PO TABS: 1.0000 mg | ORAL_TABLET | Freq: Every day | ORAL | 1 refills | Status: DC

## 2017-01-17 MED ORDER — GABAPENTIN 300 MG PO CAPS: 300.0000 mg | ORAL_CAPSULE | Freq: Two times a day (BID) | ORAL | 9 refills | Status: DC

## 2017-01-17 MED ORDER — PROCHLORPERAZINE MALEATE 10 MG PO TABS
ORAL_TABLET | ORAL | Status: AC
  Filled 2017-01-17: qty 1

## 2017-01-17 MED ORDER — SODIUM CHLORIDE 0.9 % IV SOLN
15.8000 mg/kg | Freq: Once | INTRAVENOUS | Status: AC
  Administered 2017-01-17: 1200 mg via INTRAVENOUS
  Filled 2017-01-17: qty 60

## 2017-01-17 MED ORDER — SODIUM CHLORIDE 0.9% FLUSH
10.0000 mL | Freq: Once | INTRAVENOUS | Status: AC
  Administered 2017-01-17: 10 mL
  Filled 2017-01-17: qty 10

## 2017-01-17 MED ORDER — ACETAMINOPHEN 325 MG PO TABS
650.0000 mg | ORAL_TABLET | Freq: Once | ORAL | Status: AC
  Administered 2017-01-17: 650 mg via ORAL

## 2017-01-17 MED ORDER — FAMOTIDINE IN NACL 20-0.9 MG/50ML-% IV SOLN
20.0000 mg | Freq: Once | INTRAVENOUS | Status: AC
  Administered 2017-01-17: 20 mg via INTRAVENOUS

## 2017-01-17 MED ORDER — FAMOTIDINE IN NACL 20-0.9 MG/50ML-% IV SOLN
INTRAVENOUS | Status: AC
  Filled 2017-01-17: qty 50

## 2017-01-17 MED ORDER — SODIUM CHLORIDE 0.9 % IV SOLN
20.0000 mg | Freq: Once | INTRAVENOUS | Status: AC
  Administered 2017-01-17: 20 mg via INTRAVENOUS
  Filled 2017-01-17: qty 2

## 2017-01-17 MED ORDER — HEPARIN SOD (PORK) LOCK FLUSH 100 UNIT/ML IV SOLN
500.0000 [IU] | Freq: Once | INTRAVENOUS | Status: AC | PRN
  Administered 2017-01-17: 500 [IU]
  Filled 2017-01-17: qty 5

## 2017-01-17 MED ORDER — PROCHLORPERAZINE MALEATE 10 MG PO TABS
10.0000 mg | ORAL_TABLET | Freq: Once | ORAL | Status: AC
  Administered 2017-01-17: 10 mg via ORAL

## 2017-01-17 MED ORDER — SODIUM CHLORIDE 0.9% FLUSH
10.0000 mL | INTRAVENOUS | Status: DC | PRN
  Administered 2017-01-17: 10 mL
  Filled 2017-01-17: qty 10

## 2017-01-17 MED ORDER — ACETAMINOPHEN 325 MG PO TABS
ORAL_TABLET | ORAL | Status: AC
Start: 2017-01-17 — End: 2017-01-17
  Filled 2017-01-17: qty 2

## 2017-01-17 NOTE — Patient Instructions (Signed)
[facility] Discharge Instructions for Patients Receiving Chemotherapy  Today you received the following chemotherapy agents:  Darzalex (daratumumab)  To help prevent nausea and vomiting after your treatment, we encourage you to take your nausea medication as prescribed.   If you develop nausea and vomiting that is not controlled by your nausea medication, call the clinic.   BELOW ARE SYMPTOMS THAT SHOULD BE REPORTED IMMEDIATELY:  *FEVER GREATER THAN 100.5 F  *CHILLS WITH OR WITHOUT FEVER  NAUSEA AND VOMITING THAT IS NOT CONTROLLED WITH YOUR NAUSEA MEDICATION  *UNUSUAL SHORTNESS OF BREATH  *UNUSUAL BRUISING OR BLEEDING  TENDERNESS IN MOUTH AND THROAT WITH OR WITHOUT PRESENCE OF ULCERS  *URINARY PROBLEMS  *BOWEL PROBLEMS  UNUSUAL RASH Items with * indicate a potential emergency and should be followed up as soon as possible.  Feel free to call the clinic you have any questions or concerns. The clinic phone number is [phone].  Please show the [place] at check-in to the Emergency Department and triage nurse.

## 2017-01-17 NOTE — Telephone Encounter (Signed)
Appointments scheduled,per 01/17/17 los.  Patient was given a copy of the AVS report and appointment schedule, per 01/17/17 los.

## 2017-01-18 ENCOUNTER — Encounter: Payer: Self-pay | Admitting: Hematology and Oncology

## 2017-01-18 NOTE — Assessment & Plan Note (Signed)
He had recent dizziness related to hypotension. I recommend discontinuation of diuretic therapy and monitor his blood pressure carefully

## 2017-01-18 NOTE — Assessment & Plan Note (Addendum)
He tolerated treatment well with expected side effects of treatment I recommend he continues treatment with daratumumab and Pomalyst I will see him again at the end of the month for toxicity review Recent repeat myeloma panel show excellent response to treatment I plan to reduce a dose of Pomalyst for future dosing Due to recent severe hyperglycemia, I have recommended discontinuation of dexamethasone He will also continue calcium, vitamin D and Zometa for bone health He will also take aspirin for DVT prophylaxis He will take acyclovir for antiviral prophylaxis

## 2017-01-18 NOTE — Progress Notes (Signed)
[place] OFFICE PROGRESS NOTE  Patient Care Team: Arnoldo Morale, MD as PCP - General (Family Medicine) Melburn Hake, Costella Hatcher, MD as Consulting Physician (Hematology and Oncology)  SUMMARY OF ONCOLOGIC HISTORY:   Multiple myeloma in relapse [facility])   08/28/2015 Bone Marrow Biopsy    Accession: [id number] biopsy showed 58% myeloma involvement. Cytogenetics was normal and FISH showed Del 13q and t(11;14).       09/07/2015 - 01/22/2016 Chemotherapy    he received 6 cycles of treatment of Velcade and Revlimid with Dex       09/12/2015 - 09/16/2015 Hospital Admission    He was admitted to the hospital with sepsis, treatment was placed on hold and he received one dose of GCSF for leukopenia      01/12/2016 Adverse Reaction    Delay treatment due to neutropenia      01/27/2016 Imaging    ECHO at [facility] showed normal EF      01/27/2016 PET scan    PET scan at [facility] showed innumerable lytic lesions throughout      01/27/2016 Procedure    PFT is within normal limits at [facility]      01/28/2016 Bone Marrow Biopsy    Bone marrow at [facility] showed persistent plasma cell myeloma in a normocellularmarrow (30%) with 20% atypical plasma cells and myeloid hyperplasia.      02/11/2016 - 06/10/2016 Chemotherapy    He is started on salvage Rx with Kyprolis, Cytoxan and dexamethasone      03/24/2016 Imaging    US venous Doppler showed no evidence of deep vein or superficial thrombosis involving the right lower extremity and left lower extremity. No evidence of Baker&'s cyst on the right or left.      05/18/2016 - 05/19/2016 Hospital Admission    He was admitted for chest pain evaluation. Cardiac work-up excluded cardiac events and CT angiogram were negative for PE. It showed esphageal thickening c/w reflux esophagitis likely cause for his chest discomfort      05/23/2016 - 05/25/2016 Hospital Admission    Patient was hospitalized for worsening shortness of breath and  worsening pain. CT angiogram chest negative for pulmonary emboli. Patient had a recent normal 2-D echo and ruled out for MI. Patient noted to be dehydrated. Patient admitted placed on IV fluids. Diuretic discontinued. Patient's gabapentin has been increased per recommendations from prior office visit from patient's oncologist to 600 mg 3 times daily      06/15/2016 Bone Marrow Biopsy    Bone marrow biopsy showed only 2% plasma cell, normal FISH and cytogenetics      07/26/2016 - 07/26/2016 Chemotherapy    He received high dose melphalan at [facility]       07/27/2016 Bone Marrow Transplant    He received autologous stem cell transplant at [facility]      08/05/2016 - 08/10/2016 Hospital Admission    He was admitted to [facility] for management of neutropenic fever and diarrhea. Cultures were negative. Stool culture came back positive for Novovirus and he was managed conservatively.      11/03/2016 Bone Marrow Biopsy    Bone Marrow Flow Cytometry - NO MONOCLONAL B CELL POPULATION IDENTIFIED. - PREDOMINANCE OF T CELLS WITH NON SPECIFIC CHANGES. - SEE NOTE. Diagnosis Comment: Analysis of the lymphoid population shows predominance of T lymphocytes expressing pan T-cell antigens but with relative abundance of CD8 positive cells and reversal of the CD4:CD8 ratio. No significant CD16/56 expression is identified. The T-cell changes  are not considered specific in this setting. B cells represent the minor population with no monoclonality or abnormal phenotype. 4% lambda light chain restricted plasma cells. FISH positive CCND1/IGH 12% of cells.       11/09/2016 PET scan    No FDG avid myeloma is identified. Numerous osseous lytic lesions as above. 2.Hypermetabolic left level III lymph node, likely reactive etiology.  3.Ancillary CT findings as above.      11/29/2016 -  Chemotherapy    He received treatment with daratumumab and Pomalyst       12/13/2016 - 12/16/2016 Hospital Admission    He was  admitted to the hospital for management of dehydration and early sepsis       Surgery          INTERVAL HISTORY: Please see below for problem oriented charting. He is seen in the infusion room. He had recent poorly controlled diabetes and was started on multiple medications His blood pressure recently ran low and he had dizziness Denies recent infection. Overall, he has excessive fatigue  REVIEW OF SYSTEMS:   Constitutional: Denies fevers, chills or abnormal weight loss Eyes: Denies blurriness of vision Ears, nose, mouth, throat, and face: Denies mucositis or sore throat Respiratory: Denies cough, dyspnea or wheezes Cardiovascular: Denies palpitation, chest discomfort or lower extremity swelling Gastrointestinal:  Denies nausea, heartburn or change in bowel habits Skin: Denies abnormal skin rashes Lymphatics: Denies new lymphadenopathy or easy bruising Neurological:Denies numbness, tingling or new weaknesses Behavioral/Psych: Mood is stable, no new changes  All other systems were reviewed with the patient and are negative.  I have reviewed the past medical history, past surgical history, social history and family history with the patient and they are unchanged from previous note.  ALLERGIES:  is allergic to benadryl [diphenhydramine]; trazodone and nefazodone; and latex.  MEDICATIONS:  Current Outpatient Prescriptions  Medication Sig Dispense Refill  . acetaminophen (TYLENOL) 500 MG tablet Take 1,000 mg by mouth every 6 (six) hours as needed for fever.    Marland Kitchen acyclovir (ZOVIRAX) 800 MG tablet Take 800 mg by mouth 2 (two) times daily.    Marland Kitchen aluminum-magnesium hydroxide-simethicone (MAALOX) 528-413-24 MG/5ML SUSP Take 30 mLs by mouth every 4 (four) hours as needed.    Marland Kitchen aspirin 81 MG chewable tablet Chew 81 mg by mouth daily.    Marland Kitchen CALCIUM PO Take 1 tablet by mouth daily.    . cholecalciferol (VITAMIN D) 1000 units tablet Take 1,000 Units by mouth daily.     Marland Kitchen dexamethasone  (DECADRON) 4 MG tablet Take 5 tablets (20 mg total) by mouth once a week. 60 tablet 9  . folic acid (FOLVITE) 1 MG tablet Take 1 tablet (1 mg total) by mouth daily. 90 tablet 1  . gabapentin (NEURONTIN) 300 MG capsule Take 1-2 capsules (300-600 mg total) by mouth 2 (two) times daily. Take 300 mg in the morning and 600 mg at night. 100 capsule 9  . guaiFENesin-dextromethorphan (ROBITUSSIN DM) 100-10 MG/5ML syrup Take 5 mLs by mouth every 4 (four) hours as needed for cough. 118 mL 0  . hydrochlorothiazide (HYDRODIURIL) 25 MG tablet Take 1 tablet (25 mg total) by mouth daily. 30 tablet 9  . lidocaine-prilocaine (EMLA) cream Apply to affected area once 30 g 3  . lisinopril (PRINIVIL,ZESTRIL) 20 MG tablet Take 1 tablet (20 mg total) by mouth daily. 30 tablet 6  . loperamide (IMODIUM) 2 MG capsule Take 2 mg by mouth 4 (four) times daily as needed.    . metFORMIN (  GLUCOPHAGE) 500 MG tablet Take 1 tablet (500 mg total) by mouth daily with breakfast. 30 tablet 5  . Multiple Vitamin (MULTIVITAMIN) tablet Take 1 tablet by mouth daily.  1  . ondansetron (ZOFRAN) 8 MG tablet Take 8 mg by mouth every 8 (eight) hours as needed.    . pantoprazole (PROTONIX) 40 MG tablet Take 1 tablet (40 mg total) by mouth daily. (Patient not taking: Reported on 12/20/2016) 30 tablet 6  . pomalidomide (POMALYST) 2 MG capsule Take 1 capsule (2 mg total) by mouth daily. Take with water on days 1-21. Repeat every 28 days. 21 capsule 0  . prochlorperazine (COMPAZINE) 10 MG tablet Take 10 mg by mouth every 6 (six) hours as needed.    . ranitidine (ZANTAC) 150 MG tablet Take 1 tablet (150 mg total) by mouth at bedtime. 30 tablet 3  . sulfamethoxazole-trimethoprim (BACTRIM DS,SEPTRA DS) 800-160 MG tablet Take 1 tablet by mouth 3 (three) times a week.  1  . traMADol (ULTRAM) 50 MG tablet TAKE 1 TO 2 TABLETS BY MOUTH EVERY 6 HOURS AS NEEDED FOR PAIN 90 tablet 0  . zolpidem (AMBIEN) 10 MG tablet Take 1 tablet (10 mg total) by mouth at bedtime  as needed for sleep. 30 tablet 3   No current facility-administered medications for this visit.     PHYSICAL EXAMINATION: ECOG PERFORMANCE STATUS: 2 - Symptomatic, <50% confined to bed  Vitals:   01/17/17 0902  BP: 121/75  Pulse: 66  Resp: 18  Temp: 98.5 F (36.9 C)   There were no vitals filed for this visit.  GENERAL:alert, no distress and comfortable SKIN: skin color, texture, turgor are normal, no rashes or significant lesions EYES: normal, Conjunctiva are pink and non-injected, sclera clear OROPHARYNX:no exudate, no erythema and lips, buccal mucosa, and tongue normal  NECK: supple, thyroid normal size, non-tender, without nodularity LYMPH:  no palpable lymphadenopathy in the cervical, axillary or inguinal LUNGS: clear to auscultation and percussion with normal breathing effort HEART: regular rate & rhythm and no murmurs and no lower extremity edema ABDOMEN:abdomen soft, non-tender and normal bowel sounds Musculoskeletal:no cyanosis of digits and no clubbing  NEURO: alert & oriented x 3 with fluent speech, no focal motor/sensory deficits  LABORATORY DATA:  I have reviewed the data as listed    Component Value Date/Time   NA 138 01/17/2017 0733   K 4.0 01/17/2017 0733   CL 102 12/16/2016 0418   CO2 23 01/17/2017 0733   GLUCOSE 188 (H) 01/17/2017 0733   BUN 30.1 (H) 01/17/2017 0733   CREATININE 1.1 01/17/2017 0733   CALCIUM 9.1 01/17/2017 0733   PROT 6.6 01/17/2017 0733   ALBUMIN 3.6 01/17/2017 0733   AST 11 01/17/2017 0733   ALT 34 01/17/2017 0733   ALKPHOS 50 01/17/2017 0733   BILITOT 0.43 01/17/2017 0733   GFRNONAA >60 12/16/2016 0418   GFRNONAA 71 07/24/2015 1155   GFRAA >60 12/16/2016 0418   GFRAA 82 07/24/2015 1155    No results found for: SPEP, UPEP  Lab Results  Component Value Date   WBC 3.6 (L) 01/17/2017   NEUTROABS 1.4 (L) 01/17/2017   HGB 12.6 (L) 01/17/2017   HCT 36.8 (L) 01/17/2017   MCV 91.5 01/17/2017   PLT 232 01/17/2017       Chemistry      Component Value Date/Time   NA 138 01/17/2017 0733   K 4.0 01/17/2017 0733   CL 102 12/16/2016 0418   CO2 23 01/17/2017 0733   BUN  30.1 (H) 01/17/2017 0733   CREATININE 1.1 01/17/2017 0733      Component Value Date/Time   CALCIUM 9.1 01/17/2017 0733   ALKPHOS 50 01/17/2017 0733   AST 11 01/17/2017 0733   ALT 34 01/17/2017 0733   BILITOT 0.43 01/17/2017 0733      ASSESSMENT & PLAN:  Multiple myeloma in relapse ([place]) He tolerated treatment well with expected side effects of treatment I recommend he continues treatment with daratumumab and Pomalyst I will see him again at the end of the month for toxicity review Recent repeat myeloma panel show excellent response to treatment I plan to reduce a dose of Pomalyst for future dosing Due to recent severe hyperglycemia, I have recommended discontinuation of dexamethasone He will also continue calcium, vitamin D and Zometa for bone health He will also take aspirin for DVT prophylaxis He will take acyclovir for antiviral prophylaxis  Essential hypertension He had recent dizziness related to hypotension. I recommend discontinuation of diuretic therapy and monitor his blood pressure carefully  Blood glucose elevated He had recent poorly controlled diabetes, could be induced by chemotherapy. I recommend discontinuation of dexamethasone and he will continue medication adjustment  Antineoplastic chemotherapy induced pancytopenia ([place]) I recommend resumption of Pomalyst We will continue monitoring of blood counts carefully while he is on treatment   No orders of the defined types were placed in this encounter.  All questions were answered. The patient knows to call the clinic with any problems, questions or concerns. No barriers to learning was detected. I spent 15 minutes counseling the patient face to face. The total time spent in the appointment was 20 minutes and more than 50% was on counseling and review of test  results     Heath Lark, MD 01/18/2017 10:11 AM

## 2017-01-18 NOTE — Assessment & Plan Note (Signed)
He had recent poorly controlled diabetes, could be induced by chemotherapy. I recommend discontinuation of dexamethasone and he will continue medication adjustment

## 2017-01-18 NOTE — Assessment & Plan Note (Signed)
I recommend resumption of Pomalyst We will continue monitoring of blood counts carefully while he is on treatment

## 2017-01-20 ENCOUNTER — Other Ambulatory Visit: Payer: Self-pay | Admitting: Physician Assistant

## 2017-01-23 ENCOUNTER — Ambulatory Visit (HOSPITAL_COMMUNITY)
Admission: RE | Admit: 2017-01-23 | Discharge: 2017-01-23 | Disposition: A | Discharge status: Home / Self Care | Payer: Medicaid Other | Source: Ambulatory Visit | Attending: Hematology and Oncology | Admitting: Hematology and Oncology

## 2017-01-23 ENCOUNTER — Encounter (HOSPITAL_COMMUNITY): Payer: Self-pay

## 2017-01-23 DIAGNOSIS — K219 Gastro-esophageal reflux disease without esophagitis: Secondary | ICD-10-CM | POA: Diagnosis not present

## 2017-01-23 DIAGNOSIS — Z9484 Stem cells transplant status: Secondary | ICD-10-CM | POA: Insufficient documentation

## 2017-01-23 DIAGNOSIS — Z888 Allergy status to other drugs, medicaments and biological substances status: Secondary | ICD-10-CM | POA: Insufficient documentation

## 2017-01-23 DIAGNOSIS — D649 Anemia, unspecified: Secondary | ICD-10-CM | POA: Diagnosis not present

## 2017-01-23 DIAGNOSIS — Z79899 Other long term (current) drug therapy: Secondary | ICD-10-CM | POA: Insufficient documentation

## 2017-01-23 DIAGNOSIS — C9002 Multiple myeloma in relapse: Secondary | ICD-10-CM | POA: Diagnosis not present

## 2017-01-23 DIAGNOSIS — Z7982 Long term (current) use of aspirin: Secondary | ICD-10-CM | POA: Diagnosis not present

## 2017-01-23 DIAGNOSIS — R7303 Prediabetes: Secondary | ICD-10-CM | POA: Diagnosis not present

## 2017-01-23 DIAGNOSIS — I10 Essential (primary) hypertension: Secondary | ICD-10-CM | POA: Insufficient documentation

## 2017-01-23 DIAGNOSIS — D72819 Decreased white blood cell count, unspecified: Secondary | ICD-10-CM | POA: Diagnosis not present

## 2017-01-23 DIAGNOSIS — Z91040 Latex allergy status: Secondary | ICD-10-CM | POA: Insufficient documentation

## 2017-01-23 DIAGNOSIS — Z7984 Long term (current) use of oral hypoglycemic drugs: Secondary | ICD-10-CM | POA: Insufficient documentation

## 2017-01-23 HISTORY — DX: Dyspnea, unspecified: R06.00

## 2017-01-23 HISTORY — DX: Prediabetes: R73.03

## 2017-01-23 HISTORY — DX: Gastro-esophageal reflux disease without esophagitis: K21.9

## 2017-01-23 LAB — CBC WITH DIFFERENTIAL/PLATELET
BASOS ABS: 0 10*3/uL (ref 0.0–0.1)
Basophils Relative: 1 %
EOS ABS: 0.1 10*3/uL (ref 0.0–0.7)
EOS PCT: 4 %
HCT: 34.2 % — ABNORMAL LOW (ref 39.0–52.0)
HEMOGLOBIN: 11.8 g/dL — AB (ref 13.0–17.0)
LYMPHS PCT: 35 %
Lymphs Abs: 1 10*3/uL (ref 0.7–4.0)
MCH: 31.1 pg (ref 26.0–34.0)
MCHC: 34.5 g/dL (ref 30.0–36.0)
MCV: 90 fL (ref 78.0–100.0)
Monocytes Absolute: 0.1 10*3/uL (ref 0.1–1.0)
Monocytes Relative: 4 %
NEUTROS PCT: 56 %
Neutro Abs: 1.6 10*3/uL — ABNORMAL LOW (ref 1.7–7.7)
PLATELETS: 235 10*3/uL (ref 150–400)
RBC: 3.8 MIL/uL — AB (ref 4.22–5.81)
RDW: 15.8 % — ABNORMAL HIGH (ref 11.5–15.5)
WBC: 2.9 10*3/uL — AB (ref 4.0–10.5)

## 2017-01-23 LAB — APTT: APTT: 29 s (ref 24–36)

## 2017-01-23 LAB — PROTIME-INR
INR: 1
Prothrombin Time: 13.2 seconds (ref 11.4–15.2)

## 2017-01-23 LAB — GLUCOSE, CAPILLARY: GLUCOSE-CAPILLARY: 108 mg/dL — AB (ref 65–99)

## 2017-01-23 MED ORDER — MIDAZOLAM HCL 2 MG/2ML IJ SOLN
INTRAMUSCULAR | Status: AC | PRN
  Administered 2017-01-23 (×3): 1 mg via INTRAVENOUS

## 2017-01-23 MED ORDER — MIDAZOLAM HCL 2 MG/2ML IJ SOLN
INTRAMUSCULAR | Status: AC
  Filled 2017-01-23: qty 4

## 2017-01-23 MED ORDER — SODIUM CHLORIDE 0.9 % IV SOLN
INTRAVENOUS | Status: DC
  Administered 2017-01-23: 08:00:00 via INTRAVENOUS

## 2017-01-23 MED ORDER — FENTANYL CITRATE (PF) 100 MCG/2ML IJ SOLN
INTRAMUSCULAR | Status: AC
  Filled 2017-01-23: qty 4

## 2017-01-23 MED ORDER — FENTANYL CITRATE (PF) 100 MCG/2ML IJ SOLN
INTRAMUSCULAR | Status: AC | PRN
  Administered 2017-01-23 (×3): 50 ug via INTRAVENOUS

## 2017-01-23 MED ORDER — HEPARIN SOD (PORK) LOCK FLUSH 100 UNIT/ML IV SOLN
500.0000 [IU] | INTRAVENOUS | Status: DC | PRN
  Filled 2017-01-23: qty 5

## 2017-01-23 NOTE — H&P (Signed)
Referring Physician(s): Heath Lark  Supervising Physician: Corrie Mckusick  Patient Status:  [facility] OP  Chief Complaint: "I'm having a bone marrow biopsy"  Subjective: Familiar to IR service from prior Port-A-Cath placement in 2017 and bone marrow biopsy in March of this year. He has a history of multiple myeloma and is currently on chemotherapy. He has previously received stem cell transplant. He presents again today for restaging CT-guided bone marrow biopsy for further evaluation. He is currently asymptomatic. Past Medical History:  Diagnosis Date  . Dyspnea    exertion   . Encounter for antineoplastic chemotherapy 02/09/2016  . GERD (gastroesophageal reflux disease)   . History of syphilis 02/24/2016  . Hypertension Dx 2016  . Multiple myeloma not having achieved remission ([place]) 09/01/2015  . Pre-diabetes   . Priapism 2014    Past Surgical History:  Procedure Laterality Date  . COLONOSCOPY    . POLYPECTOMY        Allergies: Benadryl [diphenhydramine]; Trazodone and nefazodone; and Latex  Medications: Prior to Admission medications   Medication Sig Start Date End Date Taking? Authorizing Provider  acetaminophen (TYLENOL) 500 MG tablet Take 1,000 mg by mouth every 6 (six) hours as needed for fever.   Yes [provider]  acyclovir (ZOVIRAX) 800 MG tablet Take 800 mg by mouth 2 (two) times daily. 08/01/16  Yes [provider]  aluminum-magnesium hydroxide-simethicone (MAALOX) 122-482-50 MG/5ML SUSP Take 30 mLs by mouth every 4 (four) hours as needed.   Yes [provider]  aspirin 81 MG chewable tablet Chew 81 mg by mouth daily.   Yes [provider]  CALCIUM PO Take 1 tablet by mouth daily.   Yes [provider]  cholecalciferol (VITAMIN D) 1000 units tablet Take 1,000 Units by mouth daily.    Yes [provider]  dexamethasone (DECADRON) 4 MG tablet Take 5 tablets (20 mg total) by mouth once a week. 11/29/16  Yes  Gorsuch, Ernst Spell, MD  folic acid (FOLVITE) 1 MG tablet Take 1 tablet (1 mg total) by mouth daily. 01/17/17  Yes Gorsuch, Ni, MD  gabapentin (NEURONTIN) 300 MG capsule Take 1-2 capsules (300-600 mg total) by mouth 2 (two) times daily. Take 300 mg in the morning and 600 mg at night. 01/17/17  Yes Gorsuch, Ni, MD  hydrochlorothiazide (HYDRODIURIL) 25 MG tablet Take 1 tablet (25 mg total) by mouth daily. 01/03/17  Yes Gorsuch, Ni, MD  lisinopril (PRINIVIL,ZESTRIL) 20 MG tablet Take 1 tablet (20 mg total) by mouth daily. 01/03/17  Yes Gorsuch, Ni, MD  loperamide (IMODIUM) 2 MG capsule Take 2 mg by mouth 4 (four) times daily as needed. 08/10/16  Yes [provider]  metFORMIN (GLUCOPHAGE) 500 MG tablet Take 1 tablet (500 mg total) by mouth daily with breakfast. 01/05/17  Yes Arnoldo Morale, MD  Multiple Vitamin (MULTIVITAMIN) tablet Take 1 tablet by mouth daily. 08/02/16  Yes [provider]  pomalidomide (POMALYST) 2 MG capsule Take 1 capsule (2 mg total) by mouth daily. Take with water on days 1-21. Repeat every 28 days. 01/16/17  Yes Gorsuch, Ni, MD  ranitidine (ZANTAC) 150 MG tablet Take 1 tablet (150 mg total) by mouth at bedtime. 01/03/17 05/03/17 Yes Gorsuch, Ni, MD  sulfamethoxazole-trimethoprim (BACTRIM DS,SEPTRA DS) 800-160 MG tablet Take 1 tablet by mouth 3 (three) times a week. 09/05/16  Yes [provider]  traMADol (ULTRAM) 50 MG tablet TAKE 1 TO 2 TABLETS BY MOUTH EVERY 6 HOURS AS NEEDED FOR PAIN 09/27/16  Yes Gorsuch, Ni,  MD  guaiFENesin-dextromethorphan (ROBITUSSIN DM) 100-10 MG/5ML syrup Take 5 mLs by mouth every 4 (four) hours as needed for cough. 12/10/16   Hillary Bow, MD  lidocaine-prilocaine (EMLA) cream Apply to affected area once 05/12/16   Heath Lark, MD  ondansetron (ZOFRAN) 8 MG tablet Take 8 mg by mouth every 8 (eight) hours as needed. 08/10/16   [provider]  pantoprazole (PROTONIX) 40 MG tablet Take 1 tablet (40 mg total) by mouth daily. Patient not  taking: Reported on 12/20/2016 06/23/16   Heath Lark, MD  prochlorperazine (COMPAZINE) 10 MG tablet Take 10 mg by mouth every 6 (six) hours as needed. 07/25/16   [provider]  zolpidem (AMBIEN) 10 MG tablet Take 1 tablet (10 mg total) by mouth at bedtime as needed for sleep. 11/29/16 12/29/16  Heath Lark, MD     Vital Signs: BP 116/87   Pulse 97   Temp 98.7 F (37.1 C) (Oral)   Resp 16   Ht 5' 6"  (1.676 m)   Wt 166 lb 4 oz (75.4 kg)   SpO2 99%   BMI 26.83 kg/m   Physical Exam awake, alert. Chest clear to auscultation bilaterally. Clean, intact right chest wall Port-A-Cath. Heart with regular rate and rhythm. Abdomen soft, positive bowel sounds, nontender. Lower extremities with no edema.  Imaging: No results found.  Labs:  CBC:  Recent Labs  01/03/17 0818 01/10/17 0810 01/17/17 0733 01/23/17 0708  WBC 7.1 5.7 3.6* 2.9*  HGB 12.5* 13.1 12.6* 11.8*  HCT 36.5* 38.5 36.8* 34.2*  PLT 181 151 232 235    COAGS:  Recent Labs  02/09/16 1032 11/02/16 0705 01/23/17 0708  INR 1.05 0.98 1.00  APTT  --   --  29    BMP:  Recent Labs  12/13/16 1625 12/14/16 0546 12/15/16 0428 12/16/16 0418  12/27/16 0740 01/03/17 0818 01/10/17 0810 01/17/17 0733  NA 136 139 140 135  < > 144 141 141 138  K 4.4 4.2 3.7 3.6  < > 3.9 4.2 4.1 4.0  CL 104 107 109 102  --   --   --   --   --   CO2 23 20* 23 24  < > 25 23 19* 23  GLUCOSE 222* 271* 118* 94  < > 99 205* 373* 188*  BUN 20 17 17 20   < > 15.1 16.1 27.0* 30.1*  CALCIUM 8.8* 8.7* 8.6* 8.5*  < > 9.1 9.5 9.5 9.1  CREATININE 1.24 1.17 0.95 1.03  < > 1.0 1.1 1.4* 1.1  GFRNONAA >60 >60 >60 >60  --   --   --   --   --   GFRAA >60 >60 >60 >60  --   --   --   --   --   < > = values in this interval not displayed.  LIVER FUNCTION TESTS:  Recent Labs  12/27/16 0740 01/03/17 0818 01/10/17 0810 01/17/17 0733  BILITOT 0.23 0.47 0.33 0.43  AST 18 16 38* 11  ALT 42 35 106* 34  ALKPHOS 43 46 58 50  PROT 6.0* 6.6 6.8   6.3 6.6  ALBUMIN 3.4* 3.7 3.6 3.6    Assessment and Plan: Patient with history of multiple myeloma, status post stem cell transplant and currently on chemotherapy. He presents today for CT-guided bone marrow biopsy for restaging.Risks and benefits discussed with the patient/spouse including, but not limited to bleeding, infection, damage to adjacent structures or low yield requiring additional tests.All of the patient's questions  were answered, patient is agreeable to proceed.Consent signed and in chart.     Electronically Signed: D. Rowe Robert, PA-C 01/23/2017, 8:34 AM   I spent a total of 20 minutes at the the patient's bedside AND on the patient's hospital floor or unit, greater than 50% of which was counseling/coordinating care for CT-guided bone marrow biopsy

## 2017-01-23 NOTE — Procedures (Signed)
Interventional Radiology Procedure Note  Procedure: CT guided aspirate and core biopsy of right posterior iliac bone Complications: None Recommendations: - Bedrest supine x 1 hrs - OTC's PRN  Pain - Follow biopsy results  Signed,  Marquel Spoto S. Ridgely Anastacio, DO    

## 2017-01-23 NOTE — Discharge Instructions (Signed)
Moderate Conscious Sedation, Adult, Care After °These instructions provide you with information about caring for yourself after your procedure. Your health care provider may also give you more specific instructions. Your treatment has been planned according to current medical practices, but problems sometimes occur. Call your health care provider if you have any problems or questions after your procedure. °What can I expect after the procedure? °After your procedure, it is common: °· To feel sleepy for several hours. °· To feel clumsy and have poor balance for several hours. °· To have poor judgment for several hours. °· To vomit if you eat too soon. ° °Follow these instructions at home: °For at least 24 hours after the procedure: ° °· Do not: °? Participate in activities where you could fall or become injured. °? Drive. °? Use heavy machinery. °? Drink alcohol. °? Take sleeping pills or medicines that cause drowsiness. °? Make important decisions or sign legal documents. °? Take care of children on your own. °· Rest. °Eating and drinking °· Follow the diet recommended by your health care provider. °· If you vomit: °? Drink water, juice, or soup when you can drink without vomiting. °? Make sure you have little or no nausea before eating solid foods. °General instructions °· Have a responsible adult stay with you until you are awake and alert. °· Take over-the-counter and prescription medicines only as told by your health care provider. °· If you smoke, do not smoke without supervision. °· Keep all follow-up visits as told by your health care provider. This is important. °Contact a health care provider if: °· You keep feeling nauseous or you keep vomiting. °· You feel light-headed. °· You develop a rash. °· You have a fever. °Get help right away if: °· You have trouble breathing. °This information is not intended to replace advice given to you by your health care provider. Make sure you discuss any questions you have  with your health care provider. °Document Released: 05/22/2013 Document Revised: 01/04/2016 Document Reviewed: 11/21/2015 °Elsevier Interactive Patient Education © 2018 [employer] °Bone Marrow Aspiration and Bone Marrow Biopsy, Adult, Care After °This sheet gives you information about how to care for yourself after your procedure. Your health care provider may also give you more specific instructions. If you have problems or questions, contact your health care provider. °What can I expect after the procedure? °After the procedure, it is common to have: °· Mild pain and tenderness. °· Swelling. °· Bruising. ° °Follow these instructions at home: °· Take over-the-counter or prescription medicines only as told by your health care provider. °· Do not take baths, swim, or use a hot tub until your health care provider approves. Ask if you can take a shower or have a sponge bath. °· Follow instructions from your health care provider about how to take care of the puncture site. Make sure you: °? Wash your hands with soap and water before you change your bandage (dressing). If soap and water are not available, use hand sanitizer. °? Change your dressing as told by your health care provider. °· Check your puncture site every day for signs of infection. Check for: °? More redness, swelling, or pain. °? More fluid or blood. °? Warmth. °? Pus or a bad smell. °· Return to your normal activities as told by your health care provider. Ask your health care provider what activities are safe for you. °· Do not drive for 24 hours if you were given a medicine to help you relax (sedative). °·   Keep all follow-up visits as told by your health care provider. This is important. °Contact a health care provider if: °· You have more redness, swelling, or pain around the puncture site. °· You have more fluid or blood coming from the puncture site. °· Your puncture site feels warm to the touch. °· You have pus or a bad smell coming from the  puncture site. °· You have a fever. °· Your pain is not controlled with medicine. °This information is not intended to replace advice given to you by your health care provider. Make sure you discuss any questions you have with your health care provider. °Document Released: 02/18/2005 Document Revised: 02/19/2016 Document Reviewed: 01/13/2016 °Elsevier Interactive Patient Education © 2018 [employer] ° °

## 2017-01-23 NOTE — Progress Notes (Signed)
Pt arrived to [facility] at 9:50 am

## 2017-01-24 ENCOUNTER — Other Ambulatory Visit (HOSPITAL_BASED_OUTPATIENT_CLINIC_OR_DEPARTMENT_OTHER): Payer: Medicaid Other

## 2017-01-24 ENCOUNTER — Ambulatory Visit (HOSPITAL_BASED_OUTPATIENT_CLINIC_OR_DEPARTMENT_OTHER): Payer: Medicaid Other

## 2017-01-24 ENCOUNTER — Ambulatory Visit: Payer: Medicaid Other

## 2017-01-24 VITALS — BP 114/77 | HR 98 | Temp 97.9°F | Resp 17

## 2017-01-24 DIAGNOSIS — C9002 Multiple myeloma in relapse: Secondary | ICD-10-CM

## 2017-01-24 DIAGNOSIS — Z5112 Encounter for antineoplastic immunotherapy: Secondary | ICD-10-CM

## 2017-01-24 DIAGNOSIS — C9001 Multiple myeloma in remission: Secondary | ICD-10-CM

## 2017-01-24 LAB — CBC WITH DIFFERENTIAL/PLATELET
BASO%: 1.5 % (ref 0.0–2.0)
Basophils Absolute: 0 10*3/uL (ref 0.0–0.1)
EOS%: 4.2 % (ref 0.0–7.0)
Eosinophils Absolute: 0.1 10*3/uL (ref 0.0–0.5)
HEMATOCRIT: 34.7 % — AB (ref 38.4–49.9)
HEMOGLOBIN: 11.8 g/dL — AB (ref 13.0–17.1)
LYMPH#: 0.7 10*3/uL — AB (ref 0.9–3.3)
LYMPH%: 25.5 % (ref 14.0–49.0)
MCH: 31.6 pg (ref 27.2–33.4)
MCHC: 34 g/dL (ref 32.0–36.0)
MCV: 92.8 fL (ref 79.3–98.0)
MONO#: 0.1 10*3/uL (ref 0.1–0.9)
MONO%: 5 % (ref 0.0–14.0)
NEUT%: 63.8 % (ref 39.0–75.0)
NEUTROS ABS: 1.7 10*3/uL (ref 1.5–6.5)
PLATELETS: 197 10*3/uL (ref 140–400)
RBC: 3.74 10*6/uL — ABNORMAL LOW (ref 4.20–5.82)
RDW: 16 % — AB (ref 11.0–14.6)
WBC: 2.6 10*3/uL — ABNORMAL LOW (ref 4.0–10.3)

## 2017-01-24 LAB — COMPREHENSIVE METABOLIC PANEL
ALBUMIN: 3 g/dL — AB (ref 3.5–5.0)
ALT: 30 U/L (ref 0–55)
ANION GAP: 8 meq/L (ref 3–11)
AST: 16 U/L (ref 5–34)
Alkaline Phosphatase: 51 U/L (ref 40–150)
BILIRUBIN TOTAL: 0.3 mg/dL (ref 0.20–1.20)
BUN: 10.2 mg/dL (ref 7.0–26.0)
CALCIUM: 8.6 mg/dL (ref 8.4–10.4)
CO2: 24 mEq/L (ref 22–29)
CREATININE: 1 mg/dL (ref 0.7–1.3)
Chloride: 105 mEq/L (ref 98–109)
EGFR: >90 mL/min/{1.73_m2} (ref >90)
Glucose: 205 mg/dl — ABNORMAL HIGH (ref 70–140)
Potassium: 3.9 mEq/L (ref 3.5–5.1)
Sodium: 137 mEq/L (ref 136–145)
TOTAL PROTEIN: 5.9 g/dL — AB (ref 6.4–8.3)

## 2017-01-24 LAB — TECHNOLOGIST REVIEW

## 2017-01-24 MED ORDER — SODIUM CHLORIDE 0.9 % IV SOLN
20.0000 mg | Freq: Once | INTRAVENOUS | Status: AC
  Administered 2017-01-24: 20 mg via INTRAVENOUS
  Filled 2017-01-24: qty 2

## 2017-01-24 MED ORDER — ACETAMINOPHEN 325 MG PO TABS
ORAL_TABLET | ORAL | Status: AC
  Filled 2017-01-24: qty 2

## 2017-01-24 MED ORDER — SODIUM CHLORIDE 0.9% FLUSH
10.0000 mL | Freq: Once | INTRAVENOUS | Status: AC
  Administered 2017-01-24: 10 mL
  Filled 2017-01-24: qty 10

## 2017-01-24 MED ORDER — HEPARIN SOD (PORK) LOCK FLUSH 100 UNIT/ML IV SOLN
500.0000 [IU] | Freq: Once | INTRAVENOUS | Status: AC | PRN
  Administered 2017-01-24: 500 [IU]
  Filled 2017-01-24: qty 5

## 2017-01-24 MED ORDER — PROCHLORPERAZINE MALEATE 10 MG PO TABS
10.0000 mg | ORAL_TABLET | Freq: Once | ORAL | Status: AC
  Administered 2017-01-24: 10 mg via ORAL

## 2017-01-24 MED ORDER — SODIUM CHLORIDE 0.9 % IV SOLN
Freq: Once | INTRAVENOUS | Status: AC
  Administered 2017-01-24: 09:00:00 via INTRAVENOUS

## 2017-01-24 MED ORDER — SODIUM CHLORIDE 0.9 % IV SOLN
15.8000 mg/kg | Freq: Once | INTRAVENOUS | Status: AC
  Administered 2017-01-24: 1200 mg via INTRAVENOUS
  Filled 2017-01-24: qty 60

## 2017-01-24 MED ORDER — FAMOTIDINE IN NACL 20-0.9 MG/50ML-% IV SOLN
INTRAVENOUS | Status: AC
  Filled 2017-01-24: qty 50

## 2017-01-24 MED ORDER — FAMOTIDINE IN NACL 20-0.9 MG/50ML-% IV SOLN
20.0000 mg | Freq: Once | INTRAVENOUS | Status: AC
  Administered 2017-01-24: 20 mg via INTRAVENOUS

## 2017-01-24 MED ORDER — PROCHLORPERAZINE MALEATE 10 MG PO TABS
ORAL_TABLET | ORAL | Status: AC
  Filled 2017-01-24: qty 1

## 2017-01-24 MED ORDER — ACETAMINOPHEN 325 MG PO TABS
650.0000 mg | ORAL_TABLET | Freq: Once | ORAL | Status: AC
  Administered 2017-01-24: 650 mg via ORAL

## 2017-01-24 MED ORDER — SODIUM CHLORIDE 0.9% FLUSH
10.0000 mL | INTRAVENOUS | Status: DC | PRN
  Administered 2017-01-24: 10 mL
  Filled 2017-01-24: qty 10

## 2017-01-24 NOTE — Patient Instructions (Signed)
[facility] Discharge Instructions for Patients Receiving Chemotherapy  Today you received the following chemotherapy agents:  Darzalex (daratumumab)  To help prevent nausea and vomiting after your treatment, we encourage you to take your nausea medication as prescribed.   If you develop nausea and vomiting that is not controlled by your nausea medication, call the clinic.   BELOW ARE SYMPTOMS THAT SHOULD BE REPORTED IMMEDIATELY:  *FEVER GREATER THAN 100.5 F  *CHILLS WITH OR WITHOUT FEVER  NAUSEA AND VOMITING THAT IS NOT CONTROLLED WITH YOUR NAUSEA MEDICATION  *UNUSUAL SHORTNESS OF BREATH  *UNUSUAL BRUISING OR BLEEDING  TENDERNESS IN MOUTH AND THROAT WITH OR WITHOUT PRESENCE OF ULCERS  *URINARY PROBLEMS  *BOWEL PROBLEMS  UNUSUAL RASH Items with * indicate a potential emergency and should be followed up as soon as possible.  Feel free to call the clinic you have any questions or concerns. The clinic phone number is [phone].  Please show the [facility] at check-in to the Emergency Department and triage nurse.

## 2017-01-31 ENCOUNTER — Encounter (HOSPITAL_COMMUNITY): Payer: Self-pay

## 2017-01-31 ENCOUNTER — Other Ambulatory Visit: Payer: Self-pay | Admitting: Hematology and Oncology

## 2017-01-31 ENCOUNTER — Ambulatory Visit: Payer: Medicaid Other

## 2017-01-31 ENCOUNTER — Ambulatory Visit (HOSPITAL_BASED_OUTPATIENT_CLINIC_OR_DEPARTMENT_OTHER): Payer: Medicaid Other | Admitting: Hematology and Oncology

## 2017-01-31 ENCOUNTER — Ambulatory Visit (HOSPITAL_COMMUNITY)
Admission: RE | Admit: 2017-01-31 | Discharge: 2017-01-31 | Disposition: A | Discharge status: Home / Self Care | Payer: Medicaid Other | Source: Ambulatory Visit | Attending: Hematology and Oncology | Admitting: Hematology and Oncology

## 2017-01-31 ENCOUNTER — Other Ambulatory Visit (HOSPITAL_BASED_OUTPATIENT_CLINIC_OR_DEPARTMENT_OTHER): Payer: Medicaid Other

## 2017-01-31 DIAGNOSIS — X58XXXA Exposure to other specified factors, initial encounter: Secondary | ICD-10-CM | POA: Insufficient documentation

## 2017-01-31 DIAGNOSIS — I2510 Atherosclerotic heart disease of native coronary artery without angina pectoris: Secondary | ICD-10-CM | POA: Diagnosis not present

## 2017-01-31 DIAGNOSIS — G893 Neoplasm related pain (acute) (chronic): Secondary | ICD-10-CM

## 2017-01-31 DIAGNOSIS — R739 Hyperglycemia, unspecified: Secondary | ICD-10-CM

## 2017-01-31 DIAGNOSIS — M4854XA Collapsed vertebra, not elsewhere classified, thoracic region, initial encounter for fracture: Secondary | ICD-10-CM | POA: Diagnosis not present

## 2017-01-31 DIAGNOSIS — R0789 Other chest pain: Secondary | ICD-10-CM | POA: Diagnosis present

## 2017-01-31 DIAGNOSIS — R05 Cough: Secondary | ICD-10-CM | POA: Diagnosis not present

## 2017-01-31 DIAGNOSIS — C9002 Multiple myeloma in relapse: Secondary | ICD-10-CM

## 2017-01-31 DIAGNOSIS — Z9481 Bone marrow transplant status: Secondary | ICD-10-CM | POA: Diagnosis not present

## 2017-01-31 DIAGNOSIS — C9000 Multiple myeloma not having achieved remission: Secondary | ICD-10-CM | POA: Diagnosis not present

## 2017-01-31 DIAGNOSIS — C9001 Multiple myeloma in remission: Secondary | ICD-10-CM

## 2017-01-31 DIAGNOSIS — R059 Cough, unspecified: Secondary | ICD-10-CM

## 2017-01-31 DIAGNOSIS — D61810 Antineoplastic chemotherapy induced pancytopenia: Secondary | ICD-10-CM

## 2017-01-31 DIAGNOSIS — T451X5A Adverse effect of antineoplastic and immunosuppressive drugs, initial encounter: Secondary | ICD-10-CM

## 2017-01-31 LAB — COMPREHENSIVE METABOLIC PANEL
ALT: 29 U/L (ref 0–55)
ANION GAP: 11 meq/L (ref 3–11)
AST: 22 U/L (ref 5–34)
Albumin: 3.2 g/dL — ABNORMAL LOW (ref 3.5–5.0)
Alkaline Phosphatase: 51 U/L (ref 40–150)
BILIRUBIN TOTAL: 0.33 mg/dL (ref 0.20–1.20)
BUN: 21.5 mg/dL (ref 7.0–26.0)
CALCIUM: 9.3 mg/dL (ref 8.4–10.4)
CO2: 26 meq/L (ref 22–29)
CREATININE: 1.3 mg/dL (ref 0.7–1.3)
Chloride: 105 mEq/L (ref 98–109)
EGFR: 69 mL/min/{1.73_m2} — ABNORMAL LOW (ref >90)
Glucose: 210 mg/dl — ABNORMAL HIGH (ref 70–140)
Potassium: 3.9 mEq/L (ref 3.5–5.1)
Sodium: 142 mEq/L (ref 136–145)
TOTAL PROTEIN: 6.4 g/dL (ref 6.4–8.3)

## 2017-01-31 LAB — CBC WITH DIFFERENTIAL/PLATELET
BASO%: 0.2 % (ref 0.0–2.0)
Basophils Absolute: 0 10*3/uL (ref 0.0–0.1)
EOS%: 0.1 % (ref 0.0–7.0)
Eosinophils Absolute: 0 10*3/uL (ref 0.0–0.5)
HEMATOCRIT: 34.2 % — AB (ref 38.4–49.9)
HGB: 11.8 g/dL — ABNORMAL LOW (ref 13.0–17.1)
LYMPH#: 1.1 10*3/uL (ref 0.9–3.3)
LYMPH%: 33.1 % (ref 14.0–49.0)
MCH: 32 pg (ref 27.2–33.4)
MCHC: 34.4 g/dL (ref 32.0–36.0)
MCV: 93.1 fL (ref 79.3–98.0)
MONO#: 0.5 10*3/uL (ref 0.1–0.9)
MONO%: 14.2 % — ABNORMAL HIGH (ref 0.0–14.0)
NEUT%: 52.4 % (ref 39.0–75.0)
NEUTROS ABS: 1.7 10*3/uL (ref 1.5–6.5)
PLATELETS: 167 10*3/uL (ref 140–400)
RBC: 3.68 10*6/uL — AB (ref 4.20–5.82)
RDW: 17.1 % — AB (ref 11.0–14.6)
WBC: 3.3 10*3/uL — AB (ref 4.0–10.3)

## 2017-01-31 LAB — URINALYSIS, MICROSCOPIC - CHCC
BILIRUBIN (URINE): NEGATIVE
BLOOD: NEGATIVE
Glucose: >2000 mg/dL
Ketones: NEGATIVE mg/dL
LEUKOCYTE ESTERASE: NEGATIVE
NITRITE: NEGATIVE
PH: 6 (ref 4.6–8.0)
Protein: NEGATIVE mg/dL
RBC / HPF: NEGATIVE (ref 0–2)
Specific Gravity, Urine: 1.01 (ref 1.003–1.035)
Urobilinogen, UR: 0.2 mg/dL (ref 0.2–1)

## 2017-01-31 MED ORDER — HYDROCODONE-HOMATROPINE 5-1.5 MG/5ML PO SYRP: 5.0000 mL | ORAL_SOLUTION | Freq: Four times a day (QID) | ORAL | 0 refills | Status: DC | PRN

## 2017-01-31 MED ORDER — IOPAMIDOL (ISOVUE-370) INJECTION 76%
INTRAVENOUS | Status: AC
  Filled 2017-01-31: qty 100

## 2017-01-31 MED ORDER — HEPARIN SOD (PORK) LOCK FLUSH 100 UNIT/ML IV SOLN
INTRAVENOUS | Status: AC
  Filled 2017-01-31: qty 5

## 2017-01-31 MED ORDER — IOPAMIDOL (ISOVUE-370) INJECTION 76%
100.0000 mL | Freq: Once | INTRAVENOUS | Status: AC | PRN
  Administered 2017-01-31: 100 mL via INTRAVENOUS

## 2017-01-31 MED ORDER — HEPARIN SOD (PORK) LOCK FLUSH 100 UNIT/ML IV SOLN: 500.0000 [IU] | Freq: Once | INTRAVENOUS | Status: DC

## 2017-01-31 NOTE — Progress Notes (Signed)
Patient presented to treatment area c/o feeling poorly. States he began feeling bad last Wednesday. Mr. Knecht said he was seen @ [facility] for immunizations on Wednesday and had a dental cleaning on Thursday. Complains of non-productive cough and malaise. Denies fever and/or chills. Upon arrival to clinic, patient tachypneic and tachycardic. Dr. Alvy Bimler aware and saw patient in treatment area. Orders received, repeated, and confirmed. Patient taken to CT via WC.

## 2017-02-01 ENCOUNTER — Encounter: Payer: Self-pay | Admitting: Hematology and Oncology

## 2017-02-01 LAB — URINE CULTURE

## 2017-02-01 NOTE — Assessment & Plan Note (Signed)
He has significant dyspnea, cough and chest discomfort on exam CT scan of the chest is reassuring with show no signs of pulmonary emboli or pneumonia Recommend over-the-counter prescription cough syrup to take as needed

## 2017-02-01 NOTE — Assessment & Plan Note (Signed)
He had recent poorly controlled diabetes, could be induced by chemotherapy. I recommend discontinuation of dexamethasone and he will continue medication adjustment for diabetes through primary care doctor

## 2017-02-01 NOTE — Assessment & Plan Note (Signed)
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 We'll continue the same He is on calcium and vitamin D supplement 

## 2017-02-01 NOTE — Assessment & Plan Note (Signed)
Recent bone marrow biopsy show complete remission He has received recent vaccination at [facility] He will continue antimicrobial treatment as prescribed

## 2017-02-01 NOTE — Assessment & Plan Note (Signed)
This is likely due to recent treatment. The patient denies recent history of bleeding such as epistaxis, hematuria or hematochezia. He is asymptomatic from the anemia and lekopenia. I will observe for now.   

## 2017-02-01 NOTE — Progress Notes (Signed)
[place] OFFICE PROGRESS NOTE  Patient Care Team: Arnoldo Morale, MD as PCP - General (Family Medicine) Melburn Hake, Costella Hatcher, MD as Consulting Physician (Hematology and Oncology)  SUMMARY OF ONCOLOGIC HISTORY:   Multiple myeloma in relapse [facility])   08/28/2015 Bone Marrow Biopsy    Accession: [id number] biopsy showed 58% myeloma involvement. Cytogenetics was normal and FISH showed Del 13q and t(11;14).       09/07/2015 - 01/22/2016 Chemotherapy    he received 6 cycles of treatment of Velcade and Revlimid with Dex       09/12/2015 - 09/16/2015 Hospital Admission    He was admitted to the hospital with sepsis, treatment was placed on hold and he received one dose of GCSF for leukopenia      01/12/2016 Adverse Reaction    Delay treatment due to neutropenia      01/27/2016 Imaging    ECHO at [facility] showed normal EF      01/27/2016 PET scan    PET scan at [facility] showed innumerable lytic lesions throughout      01/27/2016 Procedure    PFT is within normal limits at [facility]      01/28/2016 Bone Marrow Biopsy    Bone marrow at [facility] showed persistent plasma cell myeloma in a normocellularmarrow (30%) with 20% atypical plasma cells and myeloid hyperplasia.      02/11/2016 - 06/10/2016 Chemotherapy    He is started on salvage Rx with Kyprolis, Cytoxan and dexamethasone      03/24/2016 Imaging    US venous Doppler showed no evidence of deep vein or superficial thrombosis involving the right lower extremity and left lower extremity. No evidence of Baker&'s cyst on the right or left.      05/18/2016 - 05/19/2016 Hospital Admission    He was admitted for chest pain evaluation. Cardiac work-up excluded cardiac events and CT angiogram were negative for PE. It showed esphageal thickening c/w reflux esophagitis likely cause for his chest discomfort      05/23/2016 - 05/25/2016 Hospital Admission    Patient was hospitalized for worsening shortness of breath and  worsening pain. CT angiogram chest negative for pulmonary emboli. Patient had a recent normal 2-D echo and ruled out for MI. Patient noted to be dehydrated. Patient admitted placed on IV fluids. Diuretic discontinued. Patient's gabapentin has been increased per recommendations from prior office visit from patient's oncologist to 600 mg 3 times daily      06/15/2016 Bone Marrow Biopsy    Bone marrow biopsy showed only 2% plasma cell, normal FISH and cytogenetics      07/26/2016 - 07/26/2016 Chemotherapy    He received high dose melphalan at [facility]       07/27/2016 Bone Marrow Transplant    He received autologous stem cell transplant at [facility]      08/05/2016 - 08/10/2016 Hospital Admission    He was admitted to [facility] for management of neutropenic fever and diarrhea. Cultures were negative. Stool culture came back positive for Novovirus and he was managed conservatively.      11/03/2016 Bone Marrow Biopsy    Bone Marrow Flow Cytometry - NO MONOCLONAL B CELL POPULATION IDENTIFIED. - PREDOMINANCE OF T CELLS WITH NON SPECIFIC CHANGES. - SEE NOTE. Diagnosis Comment: Analysis of the lymphoid population shows predominance of T lymphocytes expressing pan T-cell antigens but with relative abundance of CD8 positive cells and reversal of the CD4:CD8 ratio. No significant CD16/56 expression is identified. The T-cell changes  are not considered specific in this setting. B cells represent the minor population with no monoclonality or abnormal phenotype. 4% lambda light chain restricted plasma cells. FISH positive CCND1/IGH 12% of cells.       11/09/2016 PET scan    No FDG avid myeloma is identified. Numerous osseous lytic lesions as above. 2.Hypermetabolic left level III lymph node, likely reactive etiology.  3.Ancillary CT findings as above.      11/29/2016 -  Chemotherapy    He received treatment with daratumumab and Pomalyst       12/13/2016 - 12/16/2016 Hospital Admission    He was  admitted to the hospital for management of dehydration and early sepsis      01/23/2017 Procedure    Status post CT-guided bone marrow biopsy, with tissue specimen sent to pathology for complete histopathologic analysis      01/23/2017 Bone Marrow Biopsy    Bone Marrow, Aspirate,Biopsy, and Clot - SLIGHTLY HYPOCELLULAR BONE MARROW FOR AGE WITH TRILINEAGE HEMATOPOIESIS AND 1% PLASMA CELLS. - SEE COMMENT. PERIPHERAL BLOOD: - NORMOCYTIC-NORMOCHROMIC ANEMIA. - LEUKOPENIA. Diagnosis Note The bone marrow is slightly hypocellular for age with trilineage hematopoiesis and non specific myeloid changes likely related to previous treatment. The plasma cells represent 1% of all cells in the aspirate with lack of large aggregate or sheets. To further evaluate the plasma cell component, immunohistochemical stains will be performed and the results reported in an addendum. Correlation with cytogenetic and FISH studies recommended.      01/31/2017 Adverse Reaction    Treatment was delayed due to SOB      01/31/2017 Imaging    1. No evidence of pulmonary embolism. Sensitivity is moderately degraded by respiratory motion. 2. Multiple myeloma.  Stable compression fracture at T9. 3. Coronary artery calcifications       INTERVAL HISTORY: Please see below for problem oriented charting. He was evaluated urgently in the infusion room due to report from nursing staff that the patient has been feeling unwell Last week, the patient underwent bone marrow biopsy, multiple vaccinations from [facility], had dental evaluation and treatment He complained of shortness of breath, feeling weak, chest discomfort and cough He was noted to have tachycardia, tachypneic and with abnormal blood work He denies significant fever or chills No recent nausea vomiting He denies new focal neurological deficit The patient denies any recent signs or symptoms of bleeding such as spontaneous epistaxis,  hematuria or hematochezia.  REVIEW OF SYSTEMS:   Constitutional: Denies fevers, chills or abnormal weight loss Eyes: Denies blurriness of vision Ears, nose, mouth, throat, and face: Denies mucositis or sore throat Gastrointestinal:  Denies nausea, heartburn or change in bowel habits Skin: Denies abnormal skin rashes Lymphatics: Denies new lymphadenopathy or easy bruising Neurological:Denies numbness, tingling or new weaknesses Behavioral/Psych: Mood is stable, no new changes  All other systems were reviewed with the patient and are negative.  I have reviewed the past medical history, past surgical history, social history and family history with the patient and they are unchanged from previous note.  ALLERGIES:  is allergic to benadryl [diphenhydramine]; trazodone and nefazodone; and latex.  MEDICATIONS:  Current Outpatient Prescriptions  Medication Sig Dispense Refill  . acetaminophen (TYLENOL) 500 MG tablet Take 1,000 mg by mouth every 6 (six) hours as needed for fever.    Marland Kitchen acyclovir (ZOVIRAX) 800 MG tablet Take 800 mg by mouth 2 (two) times daily.    Marland Kitchen aluminum-magnesium hydroxide-simethicone (MAALOX) 809-983-38 MG/5ML SUSP Take 30 mLs by mouth  every 4 (four) hours as needed.    Marland Kitchen aspirin 81 MG chewable tablet Chew 81 mg by mouth daily.    Marland Kitchen CALCIUM PO Take 1 tablet by mouth daily.    . cholecalciferol (VITAMIN D) 1000 units tablet Take 1,000 Units by mouth daily.     Marland Kitchen dexamethasone (DECADRON) 4 MG tablet Take 5 tablets (20 mg total) by mouth once a week. 60 tablet 9  . folic acid (FOLVITE) 1 MG tablet Take 1 tablet (1 mg total) by mouth daily. 90 tablet 1  . gabapentin (NEURONTIN) 300 MG capsule Take 1-2 capsules (300-600 mg total) by mouth 2 (two) times daily. Take 300 mg in the morning and 600 mg at night. 100 capsule 9  . guaiFENesin-dextromethorphan (ROBITUSSIN DM) 100-10 MG/5ML syrup Take 5 mLs by mouth every 4 (four) hours as needed for cough. 118 mL 0  . hydrochlorothiazide  (HYDRODIURIL) 25 MG tablet Take 1 tablet (25 mg total) by mouth daily. 30 tablet 9  . HYDROcodone-homatropine (HYCODAN) 5-1.5 MG/5ML syrup Take 5 mLs by mouth every 6 (six) hours as needed for cough. 240 mL 0  . lidocaine-prilocaine (EMLA) cream Apply to affected area once 30 g 3  . lisinopril (PRINIVIL,ZESTRIL) 20 MG tablet Take 1 tablet (20 mg total) by mouth daily. 30 tablet 6  . loperamide (IMODIUM) 2 MG capsule Take 2 mg by mouth 4 (four) times daily as needed.    . metFORMIN (GLUCOPHAGE) 500 MG tablet Take 1 tablet (500 mg total) by mouth daily with breakfast. 30 tablet 5  . Multiple Vitamin (MULTIVITAMIN) tablet Take 1 tablet by mouth daily.  1  . ondansetron (ZOFRAN) 8 MG tablet Take 8 mg by mouth every 8 (eight) hours as needed.    . pantoprazole (PROTONIX) 40 MG tablet Take 1 tablet (40 mg total) by mouth daily. (Patient not taking: Reported on 12/20/2016) 30 tablet 6  . pomalidomide (POMALYST) 2 MG capsule Take 1 capsule (2 mg total) by mouth daily. Take with water on days 1-21. Repeat every 28 days. 21 capsule 0  . prochlorperazine (COMPAZINE) 10 MG tablet Take 10 mg by mouth every 6 (six) hours as needed.    . ranitidine (ZANTAC) 150 MG tablet Take 1 tablet (150 mg total) by mouth at bedtime. 30 tablet 3  . sulfamethoxazole-trimethoprim (BACTRIM DS,SEPTRA DS) 800-160 MG tablet Take 1 tablet by mouth 3 (three) times a week.  1  . traMADol (ULTRAM) 50 MG tablet TAKE 1 TO 2 TABLETS BY MOUTH EVERY 6 HOURS AS NEEDED FOR PAIN 90 tablet 0  . zolpidem (AMBIEN) 10 MG tablet Take 1 tablet (10 mg total) by mouth at bedtime as needed for sleep. 30 tablet 3   No current facility-administered medications for this visit.     PHYSICAL EXAMINATION: ECOG PERFORMANCE STATUS: 2 - Symptomatic, <50% confined to bed GENERAL:alert, he appears ill with mild respiratory distress SKIN: skin color, texture, turgor are normal, no rashes or significant lesions EYES: normal, Conjunctiva are pink and non-injected,  sclera clear OROPHARYNX:no exudate, no erythema and lips, buccal mucosa, and tongue normal  NECK: supple, thyroid normal size, non-tender, without nodularity LYMPH:  no palpable lymphadenopathy in the cervical, axillary or inguinal LUNGS: clear to auscultation and percussion with normal breathing effort HEART: Tachycardia, no murmurs and no lower extremity edema ABDOMEN:abdomen soft, non-tender and normal bowel sounds Musculoskeletal:no cyanosis of digits and no clubbing  NEURO: alert & oriented x 3 with fluent speech, no focal motor/sensory deficits  LABORATORY DATA:  I have reviewed the data as listed    Component Value Date/Time   NA 142 01/31/2017 0809   K 3.9 01/31/2017 0809   CL 102 12/16/2016 0418   CO2 26 01/31/2017 0809   GLUCOSE 210 (H) 01/31/2017 0809   BUN 21.5 01/31/2017 0809   CREATININE 1.3 01/31/2017 0809   CALCIUM 9.3 01/31/2017 0809   PROT 6.4 01/31/2017 0809   ALBUMIN 3.2 (L) 01/31/2017 0809   AST 22 01/31/2017 0809   ALT 29 01/31/2017 0809   ALKPHOS 51 01/31/2017 0809   BILITOT 0.33 01/31/2017 0809   GFRNONAA >60 12/16/2016 0418   GFRNONAA 71 07/24/2015 1155   GFRAA >60 12/16/2016 0418   GFRAA 82 07/24/2015 1155    No results found for: SPEP, UPEP  Lab Results  Component Value Date   WBC 3.3 (L) 01/31/2017   NEUTROABS 1.7 01/31/2017   HGB 11.8 (L) 01/31/2017   HCT 34.2 (L) 01/31/2017   MCV 93.1 01/31/2017   PLT 167 01/31/2017      Chemistry      Component Value Date/Time   NA 142 01/31/2017 0809   K 3.9 01/31/2017 0809   CL 102 12/16/2016 0418   CO2 26 01/31/2017 0809   BUN 21.5 01/31/2017 0809   CREATININE 1.3 01/31/2017 0809      Component Value Date/Time   CALCIUM 9.3 01/31/2017 0809   ALKPHOS 51 01/31/2017 0809   AST 22 01/31/2017 0809   ALT 29 01/31/2017 0809   BILITOT 0.33 01/31/2017 0809       RADIOGRAPHIC STUDIES: I have personally reviewed the radiological images as listed and agreed with the findings in the report. Ct  Angio Chest Pe W Or Wo Contrast  Result Date: 01/31/2017 CLINICAL DATA:  Chest pain.  Cough. EXAM: CT ANGIOGRAPHY CHEST WITH CONTRAST TECHNIQUE: Multidetector CT imaging of the chest was performed using the standard protocol during bolus administration of intravenous contrast. Multiplanar CT image reconstructions and MIPs were obtained to evaluate the vascular anatomy. CONTRAST:  100 cc of Isovue 370 COMPARISON:  12/13/2016. FINDINGS: Cardiovascular: Satisfactory opacification of the pulmonary arteries to the segmental level, but sensitivity is ran emboli diminished by respiratory motion artifact. No evidence of pulmonary embolism. Normal heart size. No pericardial effusion. Lad coronary artery calcifications noted. Mediastinum/Nodes: The trachea appears patent and is midline. Normal appearance of the esophagus. Right paratracheal node measures 7 mm, image 26 of series 14. No mediastinal or hilar adenopathy. No axillary or supraclavicular adenopathy. Lungs/Pleura: No pleural effusion. Lung window detail diminished by respiratory motion artifact. No airspace consolidation, atelectasis or evidence of interstitial edema. Upper Abdomen: No acute abnormality. Musculoskeletal: Multifocal lytic lesions of multiple myeloma are again noted. Similar appearance of chronic T9 compression fracture, likely pathologic. Review of the MIP images confirms the above findings. IMPRESSION: 1. No evidence of pulmonary embolism. Sensitivity is moderately degraded by respiratory motion. 2. Multiple myeloma.  Stable compression fracture at T9. 3. Coronary artery calcifications. Electronically Signed   By: Kerby Moors M.D.   On: 01/31/2017 11:12   Ct Biopsy  Result Date: 01/23/2017 INDICATION: 61 year old male with a history of multiple myeloma EXAM: CT BIOPSY; CT BONE MARROW BIOPSY AND ASPIRATION MEDICATIONS: None. ANESTHESIA/SEDATION: Moderate (conscious) sedation was employed during this procedure. A total of Versed 3.0 mg and  Fentanyl 150 mcg was administered intravenously. Moderate Sedation Time: 10 minutes. The patient's level of consciousness and vital signs were monitored continuously by radiology nursing throughout the procedure under my direct supervision. FLUOROSCOPY TIME:  CT COMPLICATIONS: None PROCEDURE: The procedure risks, benefits, and alternatives were explained to the patient. Questions regarding the procedure were encouraged and answered. The patient understands and consents to the procedure. Scout CT of the pelvis was performed for surgical planning purposes. The posterior pelvis was prepped with chlorhexidinein a sterile fashion, and a sterile drape was applied covering the operative field. A sterile gown and sterile gloves were used for the procedure. Local anesthesia was provided with 1% Lidocaine. We targeted the right posterior iliac bone for biopsy. The skin and subcutaneous tissues were infiltrated with 1% lidocaine without epinephrine. A small stab incision was made with an 11 blade scalpel, and an 11 gauge Murphy needle was advanced with CT guidance to the posterior cortex. Manual forced was used to advance the needle through the posterior cortex and the stylet was removed. A bone marrow aspirate was retrieved and passed to a cytotechnologist in the room. The Murphy needle was then advanced without the stylet for a core biopsy. The core biopsy was retrieved and also passed to a cytotechnologist. Manual pressure was used for hemostasis and a sterile dressing was placed. No complications were encountered no significant blood loss was encountered. Patient tolerated the procedure well and remained hemodynamically stable throughout. IMPRESSION: Status post CT-guided bone marrow biopsy, with tissue specimen sent to pathology for complete histopathologic analysis Signed, Dulcy Fanny. Earleen Newport, DO [facility] Radiology Electronically Signed   By: Corrie Mckusick D.O.   On:  01/23/2017 09:59   Ct Bone Marrow Biopsy & Aspiration  Result Date: 01/23/2017 INDICATION: 61 year old male with a history of multiple myeloma EXAM: CT BIOPSY; CT BONE MARROW BIOPSY AND ASPIRATION MEDICATIONS: None. ANESTHESIA/SEDATION: Moderate (conscious) sedation was employed during this procedure. A total of Versed 3.0 mg and Fentanyl 150 mcg was administered intravenously. Moderate Sedation Time: 10 minutes. The patient's level of consciousness and vital signs were monitored continuously by radiology nursing throughout the procedure under my direct supervision. FLUOROSCOPY TIME:  CT COMPLICATIONS: None PROCEDURE: The procedure risks, benefits, and alternatives were explained to the patient. Questions regarding the procedure were encouraged and answered. The patient understands and consents to the procedure. Scout CT of the pelvis was performed for surgical planning purposes. The posterior pelvis was prepped with chlorhexidinein a sterile fashion, and a sterile drape was applied covering the operative field. A sterile gown and sterile gloves were used for the procedure. Local anesthesia was provided with 1% Lidocaine. We targeted the right posterior iliac bone for biopsy. The skin and subcutaneous tissues were infiltrated with 1% lidocaine without epinephrine. A small stab incision was made with an 11 blade scalpel, and an 11 gauge Murphy needle was advanced with CT guidance to the posterior cortex. Manual forced was used to advance the needle through the posterior cortex and the stylet was removed. A bone marrow aspirate was retrieved and passed to a cytotechnologist in the room. The Murphy needle was then advanced without the stylet for a core biopsy. The core biopsy was retrieved and also passed to a cytotechnologist. Manual pressure was used for hemostasis and a sterile dressing was placed. No complications were encountered no significant blood loss was encountered. Patient tolerated the procedure well  and remained hemodynamically stable throughout. IMPRESSION: Status post CT-guided bone marrow biopsy, with tissue specimen sent to pathology for complete histopathologic analysis Signed, Dulcy Fanny. Earleen Newport, DO [facility] Radiology Electronically Signed   By: Corrie Mckusick D.O.   On: 01/23/2017 09:59  ASSESSMENT & PLAN:  Multiple myeloma in relapse ([place]) Recent myeloma panel and bone marrow biopsy confirmed that the patient has achieved complete response to treatment He has significant symptoms of fatigue, shortness of breath and feeling unwell likely secondary to recent dental procedure, bone marrow biopsy and recent vaccination I recommend we delay treatment by 1 week to allow him to feel better CT scan of the chest is reassuring which show no signs of pulmonary emboli.  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 We'll continue the same He is on calcium and vitamin D supplement  Blood glucose elevated He had recent poorly controlled diabetes, could be induced by chemotherapy. I recommend discontinuation of dexamethasone and he will continue medication adjustment for diabetes through primary care doctor  Pancytopenia due to antineoplastic chemotherapy [facility]) This is likely due to recent treatment. The patient denies recent history of bleeding such as epistaxis, hematuria or hematochezia. He is asymptomatic from the anemia and lekopenia. I will observe for now.    S/P autologous bone marrow transplantation [facility]) Recent bone marrow biopsy show complete remission He has received recent  vaccination at [facility] He will continue antimicrobial treatment as prescribed   Orders Placed This Encounter  Procedures  . CT ANGIO CHEST PE W OR WO CONTRAST    Standing Status:   Future    Number of Occurrences:   1    Standing Expiration Date:   05/03/2018    Order Specific Question:   If indicated for the ordered procedure, I authorize the administration of contrast media per Radiology protocol    Answer:   Yes    Order Specific Question:   Reason for Exam (SYMPTOM  OR DIAGNOSIS REQUIRED)    Answer:   chest pain, dyspnea, on chemo    Order Specific Question:   Preferred imaging location?    Answer:   [facility]    Order Specific Question:   Call Results- Best Contact Number?    Answer:   [phone], no hold    Order Specific Question:   Radiology Contrast Protocol - do NOT remove file path    Answer:   \\[email or web address]   All questions were answered. The patient knows to call the clinic with any problems, questions or concerns. No barriers to learning was detected. I spent 30 minutes counseling the patient face to face. The total time spent in the appointment was 40 minutes and more than 50% was on counseling and review of test results     Heath Lark, MD 02/01/2017 4:57 PM

## 2017-02-01 NOTE — Assessment & Plan Note (Signed)
Recent myeloma panel and bone marrow biopsy confirmed that the patient has achieved complete response to treatment He has significant symptoms of fatigue, shortness of breath and feeling unwell likely secondary to recent dental procedure, bone marrow biopsy and recent vaccination I recommend we delay treatment by 1 week to allow him to feel better CT scan of the chest is reassuring which show no signs of pulmonary emboli.

## 2017-02-06 LAB — CULTURE, BLOOD (SINGLE)

## 2017-02-07 ENCOUNTER — Other Ambulatory Visit (HOSPITAL_BASED_OUTPATIENT_CLINIC_OR_DEPARTMENT_OTHER): Payer: Medicaid Other

## 2017-02-07 ENCOUNTER — Ambulatory Visit: Payer: Medicaid Other

## 2017-02-07 ENCOUNTER — Ambulatory Visit (HOSPITAL_BASED_OUTPATIENT_CLINIC_OR_DEPARTMENT_OTHER): Payer: Medicaid Other

## 2017-02-07 VITALS — BP 129/88 | HR 82 | Temp 98.2°F | Resp 16

## 2017-02-07 DIAGNOSIS — C9002 Multiple myeloma in relapse: Secondary | ICD-10-CM

## 2017-02-07 DIAGNOSIS — Z5112 Encounter for antineoplastic immunotherapy: Secondary | ICD-10-CM | POA: Diagnosis present

## 2017-02-07 DIAGNOSIS — C9001 Multiple myeloma in remission: Secondary | ICD-10-CM

## 2017-02-07 LAB — COMPREHENSIVE METABOLIC PANEL
ALT: 21 U/L (ref 0–55)
ANION GAP: 11 meq/L (ref 3–11)
AST: 12 U/L (ref 5–34)
Albumin: 3.2 g/dL — ABNORMAL LOW (ref 3.5–5.0)
Alkaline Phosphatase: 48 U/L (ref 40–150)
BUN: 18.8 mg/dL (ref 7.0–26.0)
CHLORIDE: 108 meq/L (ref 98–109)
CO2: 23 meq/L (ref 22–29)
CREATININE: 1 mg/dL (ref 0.7–1.3)
Calcium: 9.1 mg/dL (ref 8.4–10.4)
EGFR: >90 mL/min/{1.73_m2} (ref >90)
Glucose: 145 mg/dl — ABNORMAL HIGH (ref 70–140)
Potassium: 3.9 mEq/L (ref 3.5–5.1)
Sodium: 142 mEq/L (ref 136–145)
Total Bilirubin: 0.32 mg/dL (ref 0.20–1.20)
Total Protein: 6.4 g/dL (ref 6.4–8.3)

## 2017-02-07 LAB — CBC WITH DIFFERENTIAL/PLATELET
BASO%: 0.3 % (ref 0.0–2.0)
Basophils Absolute: 0 10*3/uL (ref 0.0–0.1)
EOS%: 0.1 % (ref 0.0–7.0)
Eosinophils Absolute: 0 10*3/uL (ref 0.0–0.5)
HCT: 33.3 % — ABNORMAL LOW (ref 38.4–49.9)
HGB: 11.5 g/dL — ABNORMAL LOW (ref 13.0–17.1)
LYMPH%: 39.6 % (ref 14.0–49.0)
MCH: 32.2 pg (ref 27.2–33.4)
MCHC: 34.5 g/dL (ref 32.0–36.0)
MCV: 93.4 fL (ref 79.3–98.0)
MONO#: 0.9 10*3/uL (ref 0.1–0.9)
MONO%: 20.6 % — ABNORMAL HIGH (ref 0.0–14.0)
NEUT#: 1.6 10*3/uL (ref 1.5–6.5)
NEUT%: 39.4 % (ref 39.0–75.0)
PLATELETS: 232 10*3/uL (ref 140–400)
RBC: 3.57 10*6/uL — AB (ref 4.20–5.82)
RDW: 17.5 % — ABNORMAL HIGH (ref 11.0–14.6)
WBC: 4.2 10*3/uL (ref 4.0–10.3)
lymph#: 1.7 10*3/uL (ref 0.9–3.3)

## 2017-02-07 MED ORDER — SODIUM CHLORIDE 0.9 % IV SOLN
20.0000 mg | Freq: Once | INTRAVENOUS | Status: AC
  Administered 2017-02-07: 20 mg via INTRAVENOUS
  Filled 2017-02-07: qty 2

## 2017-02-07 MED ORDER — MONTELUKAST SODIUM 10 MG PO TABS
10.0000 mg | ORAL_TABLET | Freq: Once | ORAL | Status: AC
  Administered 2017-02-07: 10 mg via ORAL

## 2017-02-07 MED ORDER — HEPARIN SOD (PORK) LOCK FLUSH 100 UNIT/ML IV SOLN
500.0000 [IU] | Freq: Once | INTRAVENOUS | Status: AC | PRN
  Administered 2017-02-07: 500 [IU]
  Filled 2017-02-07: qty 5

## 2017-02-07 MED ORDER — PROCHLORPERAZINE MALEATE 10 MG PO TABS
10.0000 mg | ORAL_TABLET | Freq: Once | ORAL | Status: AC
  Administered 2017-02-07: 10 mg via ORAL

## 2017-02-07 MED ORDER — FAMOTIDINE IN NACL 20-0.9 MG/50ML-% IV SOLN
20.0000 mg | Freq: Once | INTRAVENOUS | Status: AC
  Administered 2017-02-07: 20 mg via INTRAVENOUS

## 2017-02-07 MED ORDER — SODIUM CHLORIDE 0.9% FLUSH
10.0000 mL | INTRAVENOUS | Status: DC | PRN
  Administered 2017-02-07: 10 mL
  Filled 2017-02-07: qty 10

## 2017-02-07 MED ORDER — PROCHLORPERAZINE MALEATE 10 MG PO TABS
ORAL_TABLET | ORAL | Status: AC
  Filled 2017-02-07: qty 1

## 2017-02-07 MED ORDER — SODIUM CHLORIDE 0.9 % IV SOLN
Freq: Once | INTRAVENOUS | Status: AC
  Administered 2017-02-07: 10:00:00 via INTRAVENOUS

## 2017-02-07 MED ORDER — ACETAMINOPHEN 325 MG PO TABS
650.0000 mg | ORAL_TABLET | Freq: Once | ORAL | Status: AC
  Administered 2017-02-07: 650 mg via ORAL

## 2017-02-07 MED ORDER — ACETAMINOPHEN 325 MG PO TABS
ORAL_TABLET | ORAL | Status: AC
  Filled 2017-02-07: qty 2

## 2017-02-07 MED ORDER — SODIUM CHLORIDE 0.9 % IV SOLN
15.8000 mg/kg | Freq: Once | INTRAVENOUS | Status: AC
  Administered 2017-02-07: 1200 mg via INTRAVENOUS
  Filled 2017-02-07: qty 60

## 2017-02-07 MED ORDER — MONTELUKAST SODIUM 10 MG PO TABS
ORAL_TABLET | ORAL | Status: AC
  Filled 2017-02-07: qty 1

## 2017-02-07 MED ORDER — SODIUM CHLORIDE 0.9% FLUSH
10.0000 mL | Freq: Once | INTRAVENOUS | Status: AC
  Administered 2017-02-07: 10 mL
  Filled 2017-02-07: qty 10

## 2017-02-07 MED ORDER — FAMOTIDINE IN NACL 20-0.9 MG/50ML-% IV SOLN
INTRAVENOUS | Status: AC
  Filled 2017-02-07: qty 50

## 2017-02-07 NOTE — Patient Instructions (Signed)

## 2017-02-07 NOTE — Patient Instructions (Signed)
[facility] Discharge Instructions for Patients Receiving Chemotherapy  Today you received the following chemotherapy agents:  Darzalex (daratumumab)  To help prevent nausea and vomiting after your treatment, we encourage you to take your nausea medication as prescribed.   If you develop nausea and vomiting that is not controlled by your nausea medication, call the clinic.   BELOW ARE SYMPTOMS THAT SHOULD BE REPORTED IMMEDIATELY:  *FEVER GREATER THAN 100.5 F  *CHILLS WITH OR WITHOUT FEVER  NAUSEA AND VOMITING THAT IS NOT CONTROLLED WITH YOUR NAUSEA MEDICATION  *UNUSUAL SHORTNESS OF BREATH  *UNUSUAL BRUISING OR BLEEDING  TENDERNESS IN MOUTH AND THROAT WITH OR WITHOUT PRESENCE OF ULCERS  *URINARY PROBLEMS  *BOWEL PROBLEMS  UNUSUAL RASH Items with * indicate a potential emergency and should be followed up as soon as possible.  Feel free to call the clinic you have any questions or concerns. The clinic phone number is [phone].  Please show the [place] at check-in to the Emergency Department and triage nurse.

## 2017-02-08 ENCOUNTER — Encounter (HOSPITAL_COMMUNITY): Payer: Self-pay

## 2017-02-08 LAB — TROPONIN I

## 2017-02-08 LAB — KAPPA/LAMBDA LIGHT CHAINS
Ig Kappa Free Light Chain: 4.3 mg/L (ref 3.3–19.4)
Ig Lambda Free Light Chain: 6.7 mg/L (ref 5.7–26.3)
KAPPA/LAMBDA FLC RATIO: 0.64 (ref 0.26–1.65)

## 2017-02-08 LAB — BRAIN NATRIURETIC PEPTIDE: BNP: 60.1 pg/mL (ref 0.0–100.0)

## 2017-02-09 LAB — PROTEIN ELECTROPHORESIS, SERUM
A/G Ratio: 1.1 (ref 0.7–1.7)
ALPHA 1: 0.3 g/dL (ref 0.0–0.4)
ALPHA 2: 1.1 g/dL — AB (ref 0.4–1.0)
Albumin: 3.1 g/dL (ref 2.9–4.4)
BETA: 1 g/dL (ref 0.7–1.3)
GAMMA GLOBULIN: 0.4 g/dL (ref 0.4–1.8)
Globulin, Total: 2.8 g/dL (ref 2.2–3.9)
M-SPIKE, %: 0.1 g/dL — AB
Total Protein: 5.9 g/dL — ABNORMAL LOW (ref 6.0–8.5)

## 2017-02-16 ENCOUNTER — Other Ambulatory Visit: Payer: Self-pay | Admitting: *Deleted

## 2017-02-16 MED ORDER — LISINOPRIL 20 MG PO TABS: 20.0000 mg | ORAL_TABLET | Freq: Every day | ORAL | 6 refills | Status: DC

## 2017-02-17 LAB — TISSUE HYBRIDIZATION (BONE MARROW)-NCBH

## 2017-02-17 LAB — CHROMOSOME ANALYSIS, BONE MARROW

## 2017-02-20 ENCOUNTER — Other Ambulatory Visit: Payer: Self-pay | Admitting: *Deleted

## 2017-02-20 MED ORDER — POMALIDOMIDE 2 MG PO CAPS: 2.0000 mg | ORAL_CAPSULE | Freq: Every day | ORAL | 0 refills | Status: DC

## 2017-02-21 ENCOUNTER — Ambulatory Visit (HOSPITAL_BASED_OUTPATIENT_CLINIC_OR_DEPARTMENT_OTHER): Payer: Medicaid Other | Admitting: Hematology and Oncology

## 2017-02-21 ENCOUNTER — Telehealth: Payer: Self-pay | Admitting: Hematology and Oncology

## 2017-02-21 ENCOUNTER — Other Ambulatory Visit (HOSPITAL_BASED_OUTPATIENT_CLINIC_OR_DEPARTMENT_OTHER): Payer: Medicaid Other

## 2017-02-21 ENCOUNTER — Ambulatory Visit (HOSPITAL_BASED_OUTPATIENT_CLINIC_OR_DEPARTMENT_OTHER): Payer: Medicaid Other

## 2017-02-21 ENCOUNTER — Ambulatory Visit: Payer: Medicaid Other

## 2017-02-21 VITALS — BP 147/94 | HR 84 | Temp 98.0°F | Resp 16

## 2017-02-21 DIAGNOSIS — C9002 Multiple myeloma in relapse: Secondary | ICD-10-CM

## 2017-02-21 DIAGNOSIS — I10 Essential (primary) hypertension: Secondary | ICD-10-CM

## 2017-02-21 DIAGNOSIS — Z9481 Bone marrow transplant status: Secondary | ICD-10-CM

## 2017-02-21 DIAGNOSIS — Z5112 Encounter for antineoplastic immunotherapy: Secondary | ICD-10-CM

## 2017-02-21 DIAGNOSIS — C9001 Multiple myeloma in remission: Secondary | ICD-10-CM

## 2017-02-21 LAB — COMPREHENSIVE METABOLIC PANEL
ALT: 17 U/L (ref 0–55)
AST: 10 U/L (ref 5–34)
Albumin: 3.6 g/dL (ref 3.5–5.0)
Alkaline Phosphatase: 45 U/L (ref 40–150)
Anion Gap: 8 mEq/L (ref 3–11)
BUN: 15.7 mg/dL (ref 7.0–26.0)
CHLORIDE: 111 meq/L — AB (ref 98–109)
CO2: 25 meq/L (ref 22–29)
CREATININE: 1.2 mg/dL (ref 0.7–1.3)
Calcium: 9.3 mg/dL (ref 8.4–10.4)
EGFR: 76 mL/min/{1.73_m2} — ABNORMAL LOW (ref >90)
GLUCOSE: 132 mg/dL (ref 70–140)
POTASSIUM: 3.7 meq/L (ref 3.5–5.1)
SODIUM: 144 meq/L (ref 136–145)
Total Bilirubin: 0.3 mg/dL (ref 0.20–1.20)
Total Protein: 6.3 g/dL — ABNORMAL LOW (ref 6.4–8.3)

## 2017-02-21 LAB — CBC WITH DIFFERENTIAL/PLATELET
BASO%: 0.3 % (ref 0.0–2.0)
BASOS ABS: 0 10*3/uL (ref 0.0–0.1)
EOS%: 0.1 % (ref 0.0–7.0)
Eosinophils Absolute: 0 10*3/uL (ref 0.0–0.5)
HEMATOCRIT: 34.6 % — AB (ref 38.4–49.9)
HGB: 11.8 g/dL — ABNORMAL LOW (ref 13.0–17.1)
LYMPH#: 1.8 10*3/uL (ref 0.9–3.3)
LYMPH%: 26.2 % (ref 14.0–49.0)
MCH: 32.3 pg (ref 27.2–33.4)
MCHC: 34 g/dL (ref 32.0–36.0)
MCV: 95 fL (ref 79.3–98.0)
MONO#: 0.7 10*3/uL (ref 0.1–0.9)
MONO%: 10.2 % (ref 0.0–14.0)
NEUT#: 4.5 10*3/uL (ref 1.5–6.5)
NEUT%: 63.2 % (ref 39.0–75.0)
Platelets: 229 10*3/uL (ref 140–400)
RBC: 3.64 10*6/uL — AB (ref 4.20–5.82)
RDW: 18.4 % — ABNORMAL HIGH (ref 11.0–14.6)
WBC: 7 10*3/uL (ref 4.0–10.3)

## 2017-02-21 MED ORDER — HEPARIN SOD (PORK) LOCK FLUSH 100 UNIT/ML IV SOLN
500.0000 [IU] | Freq: Once | INTRAVENOUS | Status: AC | PRN
  Administered 2017-02-21: 500 [IU]
  Filled 2017-02-21: qty 5

## 2017-02-21 MED ORDER — ACETAMINOPHEN 325 MG PO TABS
650.0000 mg | ORAL_TABLET | Freq: Once | ORAL | Status: AC
  Administered 2017-02-21: 650 mg via ORAL

## 2017-02-21 MED ORDER — SODIUM CHLORIDE 0.9% FLUSH
10.0000 mL | INTRAVENOUS | Status: DC | PRN
  Administered 2017-02-21: 10 mL
  Filled 2017-02-21: qty 10

## 2017-02-21 MED ORDER — ACETAMINOPHEN 325 MG PO TABS
ORAL_TABLET | ORAL | Status: AC
  Filled 2017-02-21: qty 2

## 2017-02-21 MED ORDER — SODIUM CHLORIDE 0.9 % IV SOLN
15.7000 mg/kg | Freq: Once | INTRAVENOUS | Status: AC
  Administered 2017-02-21: 1200 mg via INTRAVENOUS
  Filled 2017-02-21: qty 60

## 2017-02-21 MED ORDER — PROCHLORPERAZINE MALEATE 10 MG PO TABS
10.0000 mg | ORAL_TABLET | Freq: Once | ORAL | Status: AC
  Administered 2017-02-21: 10 mg via ORAL

## 2017-02-21 MED ORDER — MONTELUKAST SODIUM 10 MG PO TABS
10.0000 mg | ORAL_TABLET | Freq: Once | ORAL | Status: AC
  Administered 2017-02-21: 10 mg via ORAL

## 2017-02-21 MED ORDER — FAMOTIDINE IN NACL 20-0.9 MG/50ML-% IV SOLN
INTRAVENOUS | Status: AC
  Filled 2017-02-21: qty 50

## 2017-02-21 MED ORDER — MONTELUKAST SODIUM 10 MG PO TABS
ORAL_TABLET | ORAL | Status: AC
Start: 2017-02-21 — End: 2017-02-21
  Filled 2017-02-21: qty 1

## 2017-02-21 MED ORDER — PROCHLORPERAZINE MALEATE 10 MG PO TABS
ORAL_TABLET | ORAL | Status: AC
  Filled 2017-02-21: qty 1

## 2017-02-21 MED ORDER — SODIUM CHLORIDE 0.9 % IV SOLN
Freq: Once | INTRAVENOUS | Status: AC
  Administered 2017-02-21: 10:00:00 via INTRAVENOUS

## 2017-02-21 MED ORDER — FAMOTIDINE IN NACL 20-0.9 MG/50ML-% IV SOLN
20.0000 mg | Freq: Once | INTRAVENOUS | Status: AC
  Administered 2017-02-21: 20 mg via INTRAVENOUS

## 2017-02-21 MED ORDER — SODIUM CHLORIDE 0.9 % IV SOLN
20.0000 mg | Freq: Once | INTRAVENOUS | Status: AC
  Administered 2017-02-21: 20 mg via INTRAVENOUS
  Filled 2017-02-21: qty 2

## 2017-02-21 NOTE — Patient Instructions (Signed)
[facility] Discharge Instructions for Patients Receiving Chemotherapy  Today you received the following chemotherapy agents darzalex  To help prevent nausea and vomiting after your treatment, we encourage you to take your nausea medication as directed  If you develop nausea and vomiting that is not controlled by your nausea medication, call the clinic.   BELOW ARE SYMPTOMS THAT SHOULD BE REPORTED IMMEDIATELY:  *FEVER GREATER THAN 100.5 F  *CHILLS WITH OR WITHOUT FEVER  NAUSEA AND VOMITING THAT IS NOT CONTROLLED WITH YOUR NAUSEA MEDICATION  *UNUSUAL SHORTNESS OF BREATH  *UNUSUAL BRUISING OR BLEEDING  TENDERNESS IN MOUTH AND THROAT WITH OR WITHOUT PRESENCE OF ULCERS  *URINARY PROBLEMS  *BOWEL PROBLEMS  UNUSUAL RASH Items with * indicate a potential emergency and should be followed up as soon as possible.  Feel free to call the clinic you have any questions or concerns. The clinic phone number is [phone].  

## 2017-02-21 NOTE — Telephone Encounter (Signed)
Scheduled appt per 7/10 los - Gave patient AVS and calender per los.  

## 2017-02-21 NOTE — Patient Instructions (Signed)

## 2017-02-22 ENCOUNTER — Encounter: Payer: Self-pay | Admitting: Hematology and Oncology

## 2017-02-22 NOTE — Assessment & Plan Note (Signed)
Recent myeloma panel and bone marrow biopsy confirmed that the patient has achieved near complete response to treatment I will continue dexamethasone taper, to be completed by end of July He is reminded to take calcium, vitamin D and will receive Zometa every 3 months He will continue antimicrobial treatment with acyclovir and aspirin for DVT prophylaxis

## 2017-02-22 NOTE — Assessment & Plan Note (Signed)
He had recent dizziness related to hypotension. BP is now stable

## 2017-02-22 NOTE — Assessment & Plan Note (Signed)
Recent bone marrow biopsy show complete remission He has received recent vaccination at [facility] He will continue antimicrobial treatment as prescribed

## 2017-02-22 NOTE — Progress Notes (Signed)
[place] OFFICE PROGRESS NOTE  Patient Care Team: Arnoldo Morale, MD as PCP - General (Family Medicine) Melburn Hake, Costella Hatcher, MD as Consulting Physician (Hematology and Oncology)  SUMMARY OF ONCOLOGIC HISTORY:   Multiple myeloma in relapse [facility])   08/28/2015 Bone Marrow Biopsy    Accession: [id number] biopsy showed 58% myeloma involvement. Cytogenetics was normal and FISH showed Del 13q and t(11;14).       09/07/2015 - 01/22/2016 Chemotherapy    he received 6 cycles of treatment of Velcade and Revlimid with Dex       09/12/2015 - 09/16/2015 Hospital Admission    He was admitted to the hospital with sepsis, treatment was placed on hold and he received one dose of GCSF for leukopenia      01/12/2016 Adverse Reaction    Delay treatment due to neutropenia      01/27/2016 Imaging    ECHO at [facility] showed normal EF      01/27/2016 PET scan    PET scan at [facility] showed innumerable lytic lesions throughout      01/27/2016 Procedure    PFT is within normal limits at [facility]      01/28/2016 Bone Marrow Biopsy    Bone marrow at [facility] showed persistent plasma cell myeloma in a normocellularmarrow (30%) with 20% atypical plasma cells and myeloid hyperplasia.      02/11/2016 - 06/10/2016 Chemotherapy    He is started on salvage Rx with Kyprolis, Cytoxan and dexamethasone      03/24/2016 Imaging    US venous Doppler showed no evidence of deep vein or superficial thrombosis involving the right lower extremity and left lower extremity. No evidence of Baker&'s cyst on the right or left.      05/18/2016 - 05/19/2016 Hospital Admission    He was admitted for chest pain evaluation. Cardiac work-up excluded cardiac events and CT angiogram were negative for PE. It showed esphageal thickening c/w reflux esophagitis likely cause for his chest discomfort      05/23/2016 - 05/25/2016 Hospital Admission    Patient was hospitalized for worsening shortness of breath and  worsening pain. CT angiogram chest negative for pulmonary emboli. Patient had a recent normal 2-D echo and ruled out for MI. Patient noted to be dehydrated. Patient admitted placed on IV fluids. Diuretic discontinued. Patient's gabapentin has been increased per recommendations from prior office visit from patient's oncologist to 600 mg 3 times daily      06/15/2016 Bone Marrow Biopsy    Bone marrow biopsy showed only 2% plasma cell, normal FISH and cytogenetics      07/26/2016 - 07/26/2016 Chemotherapy    He received high dose melphalan at [facility]       07/27/2016 Bone Marrow Transplant    He received autologous stem cell transplant at [facility]      08/05/2016 - 08/10/2016 Hospital Admission    He was admitted to [facility] for management of neutropenic fever and diarrhea. Cultures were negative. Stool culture came back positive for Novovirus and he was managed conservatively.      11/03/2016 Bone Marrow Biopsy    Bone Marrow Flow Cytometry - NO MONOCLONAL B CELL POPULATION IDENTIFIED. - PREDOMINANCE OF T CELLS WITH NON SPECIFIC CHANGES. - SEE NOTE. Diagnosis Comment: Analysis of the lymphoid population shows predominance of T lymphocytes expressing pan T-cell antigens but with relative abundance of CD8 positive cells and reversal of the CD4:CD8 ratio. No significant CD16/56 expression is identified. The T-cell changes  are not considered specific in this setting. B cells represent the minor population with no monoclonality or abnormal phenotype. 4% lambda light chain restricted plasma cells. FISH positive CCND1/IGH 12% of cells.       11/09/2016 PET scan    No FDG avid myeloma is identified. Numerous osseous lytic lesions as above. 2.Hypermetabolic left level III lymph node, likely reactive etiology.  3.Ancillary CT findings as above.      11/29/2016 -  Chemotherapy    He received treatment with daratumumab and Pomalyst       12/13/2016 - 12/16/2016 Hospital Admission    He was  admitted to the hospital for management of dehydration and early sepsis      01/23/2017 Procedure    Status post CT-guided bone marrow biopsy, with tissue specimen sent to pathology for complete histopathologic analysis      01/23/2017 Bone Marrow Biopsy    Bone Marrow, Aspirate,Biopsy, and Clot - SLIGHTLY HYPOCELLULAR BONE MARROW FOR AGE WITH TRILINEAGE HEMATOPOIESIS AND 1% PLASMA CELLS. - SEE COMMENT. PERIPHERAL BLOOD: - NORMOCYTIC-NORMOCHROMIC ANEMIA. - LEUKOPENIA. Diagnosis Note The bone marrow is slightly hypocellular for age with trilineage hematopoiesis and non specific myeloid changes likely related to previous treatment. The plasma cells represent 1% of all cells in the aspirate with lack of large aggregate or sheets. To further evaluate the plasma cell component, immunohistochemical stains will be performed and the results reported in an addendum. Correlation with cytogenetic and FISH studies recommended.      01/31/2017 Adverse Reaction    Treatment was delayed due to SOB      01/31/2017 Imaging    1. No evidence of pulmonary embolism. Sensitivity is moderately degraded by respiratory motion. 2. Multiple myeloma.  Stable compression fracture at T9. 3. Coronary artery calcifications       INTERVAL HISTORY: Please see below for problem oriented charting. He is seen in the infusion room No further chest pain, fever or dyspnea No worsening pain or neuropathy No recent infection  REVIEW OF SYSTEMS:   Constitutional: Denies fevers, chills or abnormal weight loss Eyes: Denies blurriness of vision Ears, nose, mouth, throat, and face: Denies mucositis or sore throat Respiratory: Denies cough, dyspnea or wheezes Cardiovascular: Denies palpitation, chest discomfort or lower extremity swelling Gastrointestinal:  Denies nausea, heartburn or change in bowel habits Skin: Denies abnormal skin rashes Lymphatics: Denies new lymphadenopathy or easy bruising Neurological:Denies  numbness, tingling or new weaknesses Behavioral/Psych: Mood is stable, no new changes  All other systems were reviewed with the patient and are negative.  I have reviewed the past medical history, past surgical history, social history and family history with the patient and they are unchanged from previous note.  ALLERGIES:  is allergic to benadryl [diphenhydramine]; trazodone and nefazodone; and latex.  MEDICATIONS:  Current Outpatient Prescriptions  Medication Sig Dispense Refill  . acetaminophen (TYLENOL) 500 MG tablet Take 1,000 mg by mouth every 6 (six) hours as needed for fever.    Marland Kitchen acyclovir (ZOVIRAX) 800 MG tablet Take 800 mg by mouth 2 (two) times daily.    Marland Kitchen aluminum-magnesium hydroxide-simethicone (MAALOX) 371-696-78 MG/5ML SUSP Take 30 mLs by mouth every 4 (four) hours as needed.    Marland Kitchen aspirin 81 MG chewable tablet Chew 81 mg by mouth daily.    Marland Kitchen CALCIUM PO Take 1 tablet by mouth daily.    . cholecalciferol (VITAMIN D) 1000 units tablet Take 1,000 Units by mouth daily.     Marland Kitchen dexamethasone (DECADRON) 4 MG tablet Take 5 tablets (  20 mg total) by mouth once a week. 60 tablet 9  . folic acid (FOLVITE) 1 MG tablet Take 1 tablet (1 mg total) by mouth daily. 90 tablet 1  . gabapentin (NEURONTIN) 300 MG capsule Take 1-2 capsules (300-600 mg total) by mouth 2 (two) times daily. Take 300 mg in the morning and 600 mg at night. 100 capsule 9  . guaiFENesin-dextromethorphan (ROBITUSSIN DM) 100-10 MG/5ML syrup Take 5 mLs by mouth every 4 (four) hours as needed for cough. 118 mL 0  . hydrochlorothiazide (HYDRODIURIL) 25 MG tablet Take 1 tablet (25 mg total) by mouth daily. 30 tablet 9  . HYDROcodone-homatropine (HYCODAN) 5-1.5 MG/5ML syrup Take 5 mLs by mouth every 6 (six) hours as needed for cough. 240 mL 0  . lidocaine-prilocaine (EMLA) cream Apply to affected area once 30 g 3  . lisinopril (PRINIVIL,ZESTRIL) 20 MG tablet Take 1 tablet (20 mg total) by mouth daily. 30 tablet 6  . loperamide  (IMODIUM) 2 MG capsule Take 2 mg by mouth 4 (four) times daily as needed.    . metFORMIN (GLUCOPHAGE) 500 MG tablet Take 1 tablet (500 mg total) by mouth daily with breakfast. 30 tablet 5  . Multiple Vitamin (MULTIVITAMIN) tablet Take 1 tablet by mouth daily.  1  . ondansetron (ZOFRAN) 8 MG tablet Take 8 mg by mouth every 8 (eight) hours as needed.    . pantoprazole (PROTONIX) 40 MG tablet Take 1 tablet (40 mg total) by mouth daily. (Patient not taking: Reported on 12/20/2016) 30 tablet 6  . pomalidomide (POMALYST) 2 MG capsule Take 1 capsule (2 mg total) by mouth daily. Take with water on days 1-21. Repeat every 28 days. 21 capsule 0  . prochlorperazine (COMPAZINE) 10 MG tablet Take 10 mg by mouth every 6 (six) hours as needed.    . ranitidine (ZANTAC) 150 MG tablet Take 1 tablet (150 mg total) by mouth at bedtime. 30 tablet 3  . sulfamethoxazole-trimethoprim (BACTRIM DS,SEPTRA DS) 800-160 MG tablet Take 1 tablet by mouth 3 (three) times a week.  1  . traMADol (ULTRAM) 50 MG tablet TAKE 1 TO 2 TABLETS BY MOUTH EVERY 6 HOURS AS NEEDED FOR PAIN 90 tablet 0  . zolpidem (AMBIEN) 10 MG tablet Take 1 tablet (10 mg total) by mouth at bedtime as needed for sleep. 30 tablet 3   No current facility-administered medications for this visit.     PHYSICAL EXAMINATION: ECOG PERFORMANCE STATUS: 1 - Symptomatic but completely ambulatory GENERAL:alert, no distress and comfortable SKIN: skin color, texture, turgor are normal, no rashes or significant lesions EYES: normal, Conjunctiva are pink and non-injected, sclera clear OROPHARYNX:no exudate, no erythema and lips, buccal mucosa, and tongue normal  NECK: supple, thyroid normal size, non-tender, without nodularity LYMPH:  no palpable lymphadenopathy in the cervical, axillary or inguinal LUNGS: clear to auscultation and percussion with normal breathing effort HEART: regular rate & rhythm and no murmurs and no lower extremity edema ABDOMEN:abdomen soft,  non-tender and normal bowel sounds Musculoskeletal:no cyanosis of digits and no clubbing  NEURO: alert & oriented x 3 with fluent speech, no focal motor/sensory deficits  LABORATORY DATA:  I have reviewed the data as listed    Component Value Date/Time   NA 144 02/21/2017 0850   K 3.7 02/21/2017 0850   CL 102 12/16/2016 0418   CO2 25 02/21/2017 0850   GLUCOSE 132 02/21/2017 0850   BUN 15.7 02/21/2017 0850   CREATININE 1.2 02/21/2017 0850   CALCIUM 9.3 02/21/2017 0850  PROT 6.3 (L) 02/21/2017 0850   ALBUMIN 3.6 02/21/2017 0850   AST 10 02/21/2017 0850   ALT 17 02/21/2017 0850   ALKPHOS 45 02/21/2017 0850   BILITOT 0.30 02/21/2017 0850   GFRNONAA >60 12/16/2016 0418   GFRNONAA 71 07/24/2015 1155   GFRAA >60 12/16/2016 0418   GFRAA 82 07/24/2015 1155    No results found for: SPEP, UPEP  Lab Results  Component Value Date   WBC 7.0 02/21/2017   NEUTROABS 4.5 02/21/2017   HGB 11.8 (L) 02/21/2017   HCT 34.6 (L) 02/21/2017   MCV 95.0 02/21/2017   PLT 229 02/21/2017      Chemistry      Component Value Date/Time   NA 144 02/21/2017 0850   K 3.7 02/21/2017 0850   CL 102 12/16/2016 0418   CO2 25 02/21/2017 0850   BUN 15.7 02/21/2017 0850   CREATININE 1.2 02/21/2017 0850      Component Value Date/Time   CALCIUM 9.3 02/21/2017 0850   ALKPHOS 45 02/21/2017 0850   AST 10 02/21/2017 0850   ALT 17 02/21/2017 0850   BILITOT 0.30 02/21/2017 0850       RADIOGRAPHIC STUDIES: I have personally reviewed the radiological images as listed and agreed with the findings in the report. Ct Angio Chest Pe W Or Wo Contrast  Result Date: 01/31/2017 CLINICAL DATA:  Chest pain.  Cough. EXAM: CT ANGIOGRAPHY CHEST WITH CONTRAST TECHNIQUE: Multidetector CT imaging of the chest was performed using the standard protocol during bolus administration of intravenous contrast. Multiplanar CT image reconstructions and MIPs were obtained to evaluate the vascular anatomy. CONTRAST:  100 cc of Isovue  370 COMPARISON:  12/13/2016. FINDINGS: Cardiovascular: Satisfactory opacification of the pulmonary arteries to the segmental level, but sensitivity is ran emboli diminished by respiratory motion artifact. No evidence of pulmonary embolism. Normal heart size. No pericardial effusion. Lad coronary artery calcifications noted. Mediastinum/Nodes: The trachea appears patent and is midline. Normal appearance of the esophagus. Right paratracheal node measures 7 mm, image 26 of series 14. No mediastinal or hilar adenopathy. No axillary or supraclavicular adenopathy. Lungs/Pleura: No pleural effusion. Lung window detail diminished by respiratory motion artifact. No airspace consolidation, atelectasis or evidence of interstitial edema. Upper Abdomen: No acute abnormality. Musculoskeletal: Multifocal lytic lesions of multiple myeloma are again noted. Similar appearance of chronic T9 compression fracture, likely pathologic. Review of the MIP images confirms the above findings. IMPRESSION: 1. No evidence of pulmonary embolism. Sensitivity is moderately degraded by respiratory motion. 2. Multiple myeloma.  Stable compression fracture at T9. 3. Coronary artery calcifications. Electronically Signed   By: Kerby Moors M.D.   On: 01/31/2017 11:12   Ct Biopsy  Result Date: 01/23/2017 INDICATION: 61 year old male with a history of multiple myeloma EXAM: CT BIOPSY; CT BONE MARROW BIOPSY AND ASPIRATION MEDICATIONS: None. ANESTHESIA/SEDATION: Moderate (conscious) sedation was employed during this procedure. A total of Versed 3.0 mg and Fentanyl 150 mcg was administered intravenously. Moderate Sedation Time: 10 minutes. The patient's level of consciousness and vital signs were monitored continuously by radiology nursing throughout the procedure under my direct supervision. FLUOROSCOPY TIME:  CT COMPLICATIONS: None PROCEDURE: The procedure risks, benefits, and alternatives were explained to the patient. Questions regarding the  procedure were encouraged and answered. The patient understands and consents to the procedure. Scout CT of the pelvis was performed for surgical planning purposes. The posterior pelvis was prepped with chlorhexidinein a sterile fashion, and a sterile drape was applied covering the operative field. A sterile gown  and sterile gloves were used for the procedure. Local anesthesia was provided with 1% Lidocaine. We targeted the right posterior iliac bone for biopsy. The skin and subcutaneous tissues were infiltrated with 1% lidocaine without epinephrine. A small stab incision was made with an 11 blade scalpel, and an 11 gauge Murphy needle was advanced with CT guidance to the posterior cortex. Manual forced was used to advance the needle through the posterior cortex and the stylet was removed. A bone marrow aspirate was retrieved and passed to a cytotechnologist in the room. The Murphy needle was then advanced without the stylet for a core biopsy. The core biopsy was retrieved and also passed to a cytotechnologist. Manual pressure was used for hemostasis and a sterile dressing was placed. No complications were encountered no significant blood loss was encountered. Patient tolerated the procedure well and remained hemodynamically stable throughout. IMPRESSION: Status post CT-guided bone marrow biopsy, with tissue specimen sent to pathology for complete histopathologic analysis Signed, Dulcy Fanny. Earleen Newport, DO [facility] Electronically Signed   By: Corrie Mckusick D.O.   On: 01/23/2017 09:59   Ct Bone Marrow Biopsy & Aspiration  Result Date: 01/23/2017 INDICATION: 61 year old male with a history of multiple myeloma EXAM: CT BIOPSY; CT BONE MARROW BIOPSY AND ASPIRATION MEDICATIONS: None. ANESTHESIA/SEDATION: Moderate (conscious) sedation was employed during this procedure. A total of Versed 3.0 mg and Fentanyl 150 mcg was administered intravenously. Moderate Sedation  Time: 10 minutes. The patient's level of consciousness and vital signs were monitored continuously by radiology nursing throughout the procedure under my direct supervision. FLUOROSCOPY TIME:  CT COMPLICATIONS: None PROCEDURE: The procedure risks, benefits, and alternatives were explained to the patient. Questions regarding the procedure were encouraged and answered. The patient understands and consents to the procedure. Scout CT of the pelvis was performed for surgical planning purposes. The posterior pelvis was prepped with chlorhexidinein a sterile fashion, and a sterile drape was applied covering the operative field. A sterile gown and sterile gloves were used for the procedure. Local anesthesia was provided with 1% Lidocaine. We targeted the right posterior iliac bone for biopsy. The skin and subcutaneous tissues were infiltrated with 1% lidocaine without epinephrine. A small stab incision was made with an 11 blade scalpel, and an 11 gauge Murphy needle was advanced with CT guidance to the posterior cortex. Manual forced was used to advance the needle through the posterior cortex and the stylet was removed. A bone marrow aspirate was retrieved and passed to a cytotechnologist in the room. The Murphy needle was then advanced without the stylet for a core biopsy. The core biopsy was retrieved and also passed to a cytotechnologist. Manual pressure was used for hemostasis and a sterile dressing was placed. No complications were encountered no significant blood loss was encountered. Patient tolerated the procedure well and remained hemodynamically stable throughout. IMPRESSION: Status post CT-guided bone marrow biopsy, with tissue specimen sent to pathology for complete histopathologic analysis Signed, Dulcy Fanny. Earleen Newport, DO [facility] Radiology Electronically Signed   By: Corrie Mckusick D.O.   On: 01/23/2017 09:59    ASSESSMENT & PLAN:  Multiple myeloma in relapse  [facility]) Recent myeloma panel and bone marrow biopsy confirmed that the patient has achieved near complete response to treatment I will continue dexamethasone taper, to be completed by end of July He is reminded to take calcium, vitamin D and will receive Zometa every 3 months He will continue antimicrobial treatment with acyclovir and aspirin  for DVT prophylaxis  Essential hypertension He had recent dizziness related to hypotension. BP is now stable  S/P autologous bone marrow transplantation [facility]) Recent bone marrow biopsy show complete remission He has received recent vaccination at [facility] He will continue antimicrobial treatment as prescribed   No orders of the defined types were placed in this encounter.  All questions were answered. The patient knows to call the clinic with any problems, questions or concerns. No barriers to learning was detected. I spent 15 minutes counseling the patient face to face. The total time spent in the appointment was 20 minutes and more than 50% was on counseling and review of test results     Heath Lark, MD 02/22/2017 7:00 AM

## 2017-03-07 ENCOUNTER — Ambulatory Visit (HOSPITAL_BASED_OUTPATIENT_CLINIC_OR_DEPARTMENT_OTHER): Payer: Medicaid Other

## 2017-03-07 ENCOUNTER — Other Ambulatory Visit (HOSPITAL_BASED_OUTPATIENT_CLINIC_OR_DEPARTMENT_OTHER): Payer: Medicaid Other

## 2017-03-07 VITALS — BP 164/97 | HR 93 | Temp 98.4°F | Resp 18

## 2017-03-07 DIAGNOSIS — C9001 Multiple myeloma in remission: Secondary | ICD-10-CM

## 2017-03-07 DIAGNOSIS — C9002 Multiple myeloma in relapse: Secondary | ICD-10-CM

## 2017-03-07 DIAGNOSIS — Z5112 Encounter for antineoplastic immunotherapy: Secondary | ICD-10-CM

## 2017-03-07 LAB — CBC WITH DIFFERENTIAL/PLATELET
BASO%: 0.9 % (ref 0.0–2.0)
Basophils Absolute: 0 10*3/uL (ref 0.0–0.1)
EOS%: 3.4 % (ref 0.0–7.0)
Eosinophils Absolute: 0.2 10*3/uL (ref 0.0–0.5)
HCT: 36.5 % — ABNORMAL LOW (ref 38.4–49.9)
HGB: 12.7 g/dL — ABNORMAL LOW (ref 13.0–17.1)
LYMPH%: 34.4 % (ref 14.0–49.0)
MCH: 33.4 pg (ref 27.2–33.4)
MCHC: 34.8 g/dL (ref 32.0–36.0)
MCV: 96 fL (ref 79.3–98.0)
MONO#: 0.8 10*3/uL (ref 0.1–0.9)
MONO%: 15.6 % — AB (ref 0.0–14.0)
NEUT%: 45.7 % (ref 39.0–75.0)
NEUTROS ABS: 2.3 10*3/uL (ref 1.5–6.5)
PLATELETS: 166 10*3/uL (ref 140–400)
RBC: 3.8 10*6/uL — AB (ref 4.20–5.82)
RDW: 18.6 % — ABNORMAL HIGH (ref 11.0–14.6)
WBC: 5.1 10*3/uL (ref 4.0–10.3)
lymph#: 1.7 10*3/uL (ref 0.9–3.3)

## 2017-03-07 LAB — COMPREHENSIVE METABOLIC PANEL
ALT: 16 U/L (ref 0–55)
ANION GAP: 9 meq/L (ref 3–11)
AST: 14 U/L (ref 5–34)
Albumin: 3.6 g/dL (ref 3.5–5.0)
Alkaline Phosphatase: 51 U/L (ref 40–150)
BUN: 11.5 mg/dL (ref 7.0–26.0)
CHLORIDE: 111 meq/L — AB (ref 98–109)
CO2: 23 meq/L (ref 22–29)
CREATININE: 1.1 mg/dL (ref 0.7–1.3)
Calcium: 9.2 mg/dL (ref 8.4–10.4)
EGFR: 87 mL/min/{1.73_m2} — ABNORMAL LOW (ref >90)
GLUCOSE: 105 mg/dL (ref 70–140)
Potassium: 3.7 mEq/L (ref 3.5–5.1)
SODIUM: 144 meq/L (ref 136–145)
Total Bilirubin: 0.57 mg/dL (ref 0.20–1.20)
Total Protein: 6.6 g/dL (ref 6.4–8.3)

## 2017-03-07 MED ORDER — SODIUM CHLORIDE 0.9% FLUSH
10.0000 mL | INTRAVENOUS | Status: DC | PRN
  Administered 2017-03-07: 10 mL
  Filled 2017-03-07: qty 10

## 2017-03-07 MED ORDER — PROCHLORPERAZINE MALEATE 10 MG PO TABS
10.0000 mg | ORAL_TABLET | Freq: Once | ORAL | Status: AC
  Administered 2017-03-07: 10 mg via ORAL

## 2017-03-07 MED ORDER — SODIUM CHLORIDE 0.9 % IV SOLN
20.0000 mg | Freq: Once | INTRAVENOUS | Status: AC
  Administered 2017-03-07: 20 mg via INTRAVENOUS
  Filled 2017-03-07: qty 2

## 2017-03-07 MED ORDER — FAMOTIDINE IN NACL 20-0.9 MG/50ML-% IV SOLN
INTRAVENOUS | Status: AC
  Filled 2017-03-07: qty 50

## 2017-03-07 MED ORDER — MONTELUKAST SODIUM 10 MG PO TABS
10.0000 mg | ORAL_TABLET | Freq: Once | ORAL | Status: AC
  Administered 2017-03-07: 10 mg via ORAL

## 2017-03-07 MED ORDER — ACETAMINOPHEN 325 MG PO TABS
650.0000 mg | ORAL_TABLET | Freq: Once | ORAL | Status: AC
  Administered 2017-03-07: 650 mg via ORAL

## 2017-03-07 MED ORDER — ACETAMINOPHEN 325 MG PO TABS
ORAL_TABLET | ORAL | Status: AC
  Filled 2017-03-07: qty 2

## 2017-03-07 MED ORDER — MONTELUKAST SODIUM 10 MG PO TABS
ORAL_TABLET | ORAL | Status: AC
  Filled 2017-03-07: qty 1

## 2017-03-07 MED ORDER — SODIUM CHLORIDE 0.9 % IV SOLN
15.8000 mg/kg | Freq: Once | INTRAVENOUS | Status: AC
  Administered 2017-03-07: 1200 mg via INTRAVENOUS
  Filled 2017-03-07: qty 60

## 2017-03-07 MED ORDER — PROCHLORPERAZINE MALEATE 10 MG PO TABS
ORAL_TABLET | ORAL | Status: AC
  Filled 2017-03-07: qty 1

## 2017-03-07 MED ORDER — SODIUM CHLORIDE 0.9 % IV SOLN
Freq: Once | INTRAVENOUS | Status: AC
  Administered 2017-03-07: 09:00:00 via INTRAVENOUS

## 2017-03-07 MED ORDER — HEPARIN SOD (PORK) LOCK FLUSH 100 UNIT/ML IV SOLN
500.0000 [IU] | Freq: Once | INTRAVENOUS | Status: AC | PRN
  Administered 2017-03-07: 500 [IU]
  Filled 2017-03-07: qty 5

## 2017-03-07 MED ORDER — FAMOTIDINE IN NACL 20-0.9 MG/50ML-% IV SOLN
20.0000 mg | Freq: Once | INTRAVENOUS | Status: AC
  Administered 2017-03-07: 20 mg via INTRAVENOUS

## 2017-03-07 NOTE — Patient Instructions (Signed)
[facility] Discharge Instructions for Patients Receiving Chemotherapy  Today you received the following chemotherapy agents Darzalex.  To help prevent nausea and vomiting after your treatment, we encourage you to take your nausea medication as directed.  If you develop nausea and vomiting that is not controlled by your nausea medication, call the clinic.   BELOW ARE SYMPTOMS THAT SHOULD BE REPORTED IMMEDIATELY:  *FEVER GREATER THAN 100.5 F  *CHILLS WITH OR WITHOUT FEVER  NAUSEA AND VOMITING THAT IS NOT CONTROLLED WITH YOUR NAUSEA MEDICATION  *UNUSUAL SHORTNESS OF BREATH  *UNUSUAL BRUISING OR BLEEDING  TENDERNESS IN MOUTH AND THROAT WITH OR WITHOUT PRESENCE OF ULCERS  *URINARY PROBLEMS  *BOWEL PROBLEMS  UNUSUAL RASH Items with * indicate a potential emergency and should be followed up as soon as possible.  Feel free to call the clinic you have any questions or concerns. The clinic phone number is [phone].  Please show the CHEMO ALERT CARD at check-in to the Emergency Department and triage nurse.    

## 2017-03-08 LAB — KAPPA/LAMBDA LIGHT CHAINS
IG LAMBDA FREE LIGHT CHAIN: 12 mg/L (ref 5.7–26.3)
Ig Kappa Free Light Chain: 10.4 mg/L (ref 3.3–19.4)
KAPPA/LAMBDA FLC RATIO: 0.87 (ref 0.26–1.65)

## 2017-03-08 LAB — PROTEIN ELECTROPHORESIS, SERUM
A/G RATIO SPE: 1.5 (ref 0.7–1.7)
ALPHA 1: 0.2 g/dL (ref 0.0–0.4)
Albumin: 3.6 g/dL (ref 2.9–4.4)
Alpha 2: 0.8 g/dL (ref 0.4–1.0)
Beta: 0.9 g/dL (ref 0.7–1.3)
Gamma Globulin: 0.5 g/dL (ref 0.4–1.8)
Globulin, Total: 2.4 g/dL (ref 2.2–3.9)
M-SPIKE, %: 0.2 g/dL — AB
TOTAL PROTEIN: 6 g/dL (ref 6.0–8.5)

## 2017-03-21 ENCOUNTER — Ambulatory Visit: Payer: Medicaid Other

## 2017-03-21 ENCOUNTER — Ambulatory Visit (HOSPITAL_BASED_OUTPATIENT_CLINIC_OR_DEPARTMENT_OTHER): Payer: Medicaid Other | Admitting: Hematology and Oncology

## 2017-03-21 ENCOUNTER — Telehealth: Payer: Self-pay | Admitting: Hematology and Oncology

## 2017-03-21 ENCOUNTER — Other Ambulatory Visit (HOSPITAL_BASED_OUTPATIENT_CLINIC_OR_DEPARTMENT_OTHER): Payer: Medicaid Other

## 2017-03-21 ENCOUNTER — Ambulatory Visit (HOSPITAL_BASED_OUTPATIENT_CLINIC_OR_DEPARTMENT_OTHER): Payer: Medicaid Other

## 2017-03-21 VITALS — BP 162/96 | HR 63 | Temp 98.1°F | Resp 17

## 2017-03-21 DIAGNOSIS — C9002 Multiple myeloma in relapse: Secondary | ICD-10-CM | POA: Diagnosis present

## 2017-03-21 DIAGNOSIS — Z9481 Bone marrow transplant status: Secondary | ICD-10-CM

## 2017-03-21 DIAGNOSIS — D6481 Anemia due to antineoplastic chemotherapy: Secondary | ICD-10-CM

## 2017-03-21 DIAGNOSIS — Z5112 Encounter for antineoplastic immunotherapy: Secondary | ICD-10-CM

## 2017-03-21 DIAGNOSIS — C9001 Multiple myeloma in remission: Secondary | ICD-10-CM

## 2017-03-21 DIAGNOSIS — T451X5A Adverse effect of antineoplastic and immunosuppressive drugs, initial encounter: Secondary | ICD-10-CM

## 2017-03-21 LAB — CBC WITH DIFFERENTIAL/PLATELET
BASO%: 0.6 % (ref 0.0–2.0)
Basophils Absolute: 0 10*3/uL (ref 0.0–0.1)
EOS%: 0.1 % (ref 0.0–7.0)
Eosinophils Absolute: 0 10*3/uL (ref 0.0–0.5)
HEMATOCRIT: 36.2 % — AB (ref 38.4–49.9)
HEMOGLOBIN: 12.2 g/dL — AB (ref 13.0–17.1)
LYMPH#: 1.6 10*3/uL (ref 0.9–3.3)
LYMPH%: 29.6 % (ref 14.0–49.0)
MCH: 33.1 pg (ref 27.2–33.4)
MCHC: 33.7 g/dL (ref 32.0–36.0)
MCV: 98.2 fL — ABNORMAL HIGH (ref 79.3–98.0)
MONO#: 0.7 10*3/uL (ref 0.1–0.9)
MONO%: 12.9 % (ref 0.0–14.0)
NEUT%: 56.8 % (ref 39.0–75.0)
NEUTROS ABS: 3 10*3/uL (ref 1.5–6.5)
PLATELETS: 308 10*3/uL (ref 140–400)
RBC: 3.69 10*6/uL — ABNORMAL LOW (ref 4.20–5.82)
RDW: 18.5 % — AB (ref 11.0–14.6)
WBC: 5.3 10*3/uL (ref 4.0–10.3)

## 2017-03-21 LAB — COMPREHENSIVE METABOLIC PANEL
ALBUMIN: 3.7 g/dL (ref 3.5–5.0)
ALK PHOS: 43 U/L (ref 40–150)
ALT: 16 U/L (ref 0–55)
AST: 14 U/L (ref 5–34)
Anion Gap: 8 mEq/L (ref 3–11)
BILIRUBIN TOTAL: 0.45 mg/dL (ref 0.20–1.20)
BUN: 15.2 mg/dL (ref 7.0–26.0)
CALCIUM: 9.1 mg/dL (ref 8.4–10.4)
CO2: 25 mEq/L (ref 22–29)
CREATININE: 1.1 mg/dL (ref 0.7–1.3)
Chloride: 113 mEq/L — ABNORMAL HIGH (ref 98–109)
EGFR: 82 mL/min/{1.73_m2} — ABNORMAL LOW (ref >90)
Glucose: 130 mg/dl (ref 70–140)
Potassium: 3.5 mEq/L (ref 3.5–5.1)
Sodium: 146 mEq/L — ABNORMAL HIGH (ref 136–145)
TOTAL PROTEIN: 6.4 g/dL (ref 6.4–8.3)

## 2017-03-21 MED ORDER — SODIUM CHLORIDE 0.9% FLUSH
10.0000 mL | INTRAVENOUS | Status: DC | PRN
  Administered 2017-03-21: 10 mL
  Filled 2017-03-21: qty 10

## 2017-03-21 MED ORDER — ZOLEDRONIC ACID 4 MG/100ML IV SOLN
4.0000 mg | Freq: Once | INTRAVENOUS | Status: AC
  Administered 2017-03-21: 4 mg via INTRAVENOUS
  Filled 2017-03-21: qty 100

## 2017-03-21 MED ORDER — SODIUM CHLORIDE 0.9 % IV SOLN
1200.0000 mg | Freq: Once | INTRAVENOUS | Status: AC
  Administered 2017-03-21: 1200 mg via INTRAVENOUS
  Filled 2017-03-21: qty 60

## 2017-03-21 MED ORDER — GABAPENTIN 300 MG PO CAPS: 300.0000 mg | ORAL_CAPSULE | Freq: Two times a day (BID) | ORAL | 9 refills | Status: AC

## 2017-03-21 MED ORDER — SODIUM CHLORIDE 0.9 % IV SOLN
Freq: Once | INTRAVENOUS | Status: AC
  Administered 2017-03-21: 11:00:00 via INTRAVENOUS

## 2017-03-21 MED ORDER — PROCHLORPERAZINE MALEATE 10 MG PO TABS
10.0000 mg | ORAL_TABLET | Freq: Once | ORAL | Status: AC
  Administered 2017-03-21: 10 mg via ORAL

## 2017-03-21 MED ORDER — ACETAMINOPHEN 325 MG PO TABS
ORAL_TABLET | ORAL | Status: AC
  Filled 2017-03-21: qty 2

## 2017-03-21 MED ORDER — SODIUM CHLORIDE 0.9% FLUSH
10.0000 mL | INTRAVENOUS | Status: DC | PRN
Start: 2017-03-21 — End: 2018-02-14
  Administered 2017-03-21: 10 mL
  Filled 2017-03-21: qty 10

## 2017-03-21 MED ORDER — FAMOTIDINE IN NACL 20-0.9 MG/50ML-% IV SOLN
INTRAVENOUS | Status: AC
  Filled 2017-03-21: qty 50

## 2017-03-21 MED ORDER — SODIUM CHLORIDE 0.9 % IV SOLN
20.0000 mg | Freq: Once | INTRAVENOUS | Status: AC
  Administered 2017-03-21: 20 mg via INTRAVENOUS
  Filled 2017-03-21: qty 2

## 2017-03-21 MED ORDER — FAMOTIDINE IN NACL 20-0.9 MG/50ML-% IV SOLN
20.0000 mg | Freq: Once | INTRAVENOUS | Status: AC
  Administered 2017-03-21: 20 mg via INTRAVENOUS

## 2017-03-21 MED ORDER — HEPARIN SOD (PORK) LOCK FLUSH 100 UNIT/ML IV SOLN
500.0000 [IU] | Freq: Once | INTRAVENOUS | Status: AC | PRN
  Administered 2017-03-21: 500 [IU]
  Filled 2017-03-21: qty 5

## 2017-03-21 MED ORDER — MONTELUKAST SODIUM 10 MG PO TABS
ORAL_TABLET | ORAL | Status: AC
  Filled 2017-03-21: qty 1

## 2017-03-21 MED ORDER — MONTELUKAST SODIUM 10 MG PO TABS
10.0000 mg | ORAL_TABLET | Freq: Once | ORAL | Status: AC
  Administered 2017-03-21: 10 mg via ORAL

## 2017-03-21 MED ORDER — PALONOSETRON HCL INJECTION 0.25 MG/5ML
INTRAVENOUS | Status: AC
  Filled 2017-03-21: qty 5

## 2017-03-21 MED ORDER — DEXAMETHASONE SODIUM PHOSPHATE 10 MG/ML IJ SOLN
INTRAMUSCULAR | Status: AC
  Filled 2017-03-21: qty 1

## 2017-03-21 MED ORDER — PROCHLORPERAZINE MALEATE 10 MG PO TABS
ORAL_TABLET | ORAL | Status: AC
  Filled 2017-03-21: qty 1

## 2017-03-21 MED ORDER — ACETAMINOPHEN 325 MG PO TABS
650.0000 mg | ORAL_TABLET | Freq: Once | ORAL | Status: AC
  Administered 2017-03-21: 650 mg via ORAL

## 2017-03-21 NOTE — Patient Instructions (Signed)
[facility] Discharge Instructions for Patients Receiving Chemotherapy  Today you received the following chemotherapy agents :  Darzalex  To help prevent nausea and vomiting after your treatment, we encourage you to take your nausea medication as prescribed.   If you develop nausea and vomiting that is not controlled by your nausea medication, call the clinic.   BELOW ARE SYMPTOMS THAT SHOULD BE REPORTED IMMEDIATELY:  *FEVER GREATER THAN 100.5 F  *CHILLS WITH OR WITHOUT FEVER  NAUSEA AND VOMITING THAT IS NOT CONTROLLED WITH YOUR NAUSEA MEDICATION  *UNUSUAL SHORTNESS OF BREATH  *UNUSUAL BRUISING OR BLEEDING  TENDERNESS IN MOUTH AND THROAT WITH OR WITHOUT PRESENCE OF ULCERS  *URINARY PROBLEMS  *BOWEL PROBLEMS  UNUSUAL RASH Items with * indicate a potential emergency and should be followed up as soon as possible.  Feel free to call the clinic you have any questions or concerns. The clinic phone number is [phone].  Please show the CHEMO ALERT CARD at check-in to the Emergency Department and triage nurse.   

## 2017-03-21 NOTE — Telephone Encounter (Signed)
Gave patient avs and calendar for August and September.  °

## 2017-03-21 NOTE — Patient Instructions (Signed)

## 2017-03-22 ENCOUNTER — Other Ambulatory Visit: Payer: Self-pay | Admitting: Hematology and Oncology

## 2017-03-22 ENCOUNTER — Telehealth: Payer: Self-pay | Admitting: *Deleted

## 2017-03-22 NOTE — Telephone Encounter (Signed)
I placed new scheduling msg to restart chemo on 9/11

## 2017-03-22 NOTE — Telephone Encounter (Signed)
"  Ask Dr. Alvy Bimler if I may reschedule chemotherapy the second week in September?  An oversight on my part.  Post transplant immunizations scheduled at [facility] the week of September 5th.  The effect of vaccines present with chemotherapy made me sick the last time (June 13th).  I'll need labs the week after vaccines.  Return number [phone]."   Currently scheduled 04-19-2017 beginning 0845 for lab, flush, infusion with F/U in treatment.      Routing to provider for review and further patient communication thru collaborative.

## 2017-03-22 NOTE — Telephone Encounter (Signed)
Called with below message. 

## 2017-03-23 ENCOUNTER — Encounter: Payer: Self-pay | Admitting: Hematology and Oncology

## 2017-03-23 NOTE — Assessment & Plan Note (Signed)
Recent myeloma panel and bone marrow biopsy confirmed that the patient has achieved near complete response to treatment He has recently completed dexamethasone taper He is reminded to take calcium, vitamin D and will receive Zometa every 3 months He will continue antimicrobial treatment with acyclovir and aspirin for DVT prophylaxis 

## 2017-03-23 NOTE — Progress Notes (Signed)
[place] OFFICE PROGRESS NOTE  Patient Care Team: Arnoldo Morale, MD as PCP - General (Family Medicine) Melburn Hake, Costella Hatcher, MD as Consulting Physician (Hematology and Oncology)  SUMMARY OF ONCOLOGIC HISTORY:   Multiple myeloma in relapse [facility])   08/28/2015 Bone Marrow Biopsy    Accession: [id number] biopsy showed 58% myeloma involvement. Cytogenetics was normal and FISH showed Del 13q and t(11;14).       09/07/2015 - 01/22/2016 Chemotherapy    he received 6 cycles of treatment of Velcade and Revlimid with Dex       09/12/2015 - 09/16/2015 Hospital Admission    He was admitted to the hospital with sepsis, treatment was placed on hold and he received one dose of GCSF for leukopenia      01/12/2016 Adverse Reaction    Delay treatment due to neutropenia      01/27/2016 Imaging    ECHO at [facility] showed normal EF      01/27/2016 PET scan    PET scan at [facility] showed innumerable lytic lesions throughout      01/27/2016 Procedure    PFT is within normal limits at [facility]      01/28/2016 Bone Marrow Biopsy    Bone marrow at [facility] showed persistent plasma cell myeloma in a normocellularmarrow (30%) with 20% atypical plasma cells and myeloid hyperplasia.      02/11/2016 - 06/10/2016 Chemotherapy    He is started on salvage Rx with Kyprolis, Cytoxan and dexamethasone      03/24/2016 Imaging    US venous Doppler showed no evidence of deep vein or superficial thrombosis involving the right lower extremity and left lower extremity. No evidence of Baker&'s cyst on the right or left.      05/18/2016 - 05/19/2016 Hospital Admission    He was admitted for chest pain evaluation. Cardiac work-up excluded cardiac events and CT angiogram were negative for PE. It showed esphageal thickening c/w reflux esophagitis likely cause for his chest discomfort      05/23/2016 - 05/25/2016 Hospital Admission    Patient was hospitalized for worsening shortness of breath and  worsening pain. CT angiogram chest negative for pulmonary emboli. Patient had a recent normal 2-D echo and ruled out for MI. Patient noted to be dehydrated. Patient admitted placed on IV fluids. Diuretic discontinued. Patient's gabapentin has been increased per recommendations from prior office visit from patient's oncologist to 600 mg 3 times daily      06/15/2016 Bone Marrow Biopsy    Bone marrow biopsy showed only 2% plasma cell, normal FISH and cytogenetics      07/26/2016 - 07/26/2016 Chemotherapy    He received high dose melphalan at [facility]       07/27/2016 Bone Marrow Transplant    He received autologous stem cell transplant at [facility]      08/05/2016 - 08/10/2016 Hospital Admission    He was admitted to [facility] for management of neutropenic fever and diarrhea. Cultures were negative. Stool culture came back positive for Novovirus and he was managed conservatively.      11/03/2016 Bone Marrow Biopsy    Bone Marrow Flow Cytometry - NO MONOCLONAL B CELL POPULATION IDENTIFIED. - PREDOMINANCE OF T CELLS WITH NON SPECIFIC CHANGES. - SEE NOTE. Diagnosis Comment: Analysis of the lymphoid population shows predominance of T lymphocytes expressing pan T-cell antigens but with relative abundance of CD8 positive cells and reversal of the CD4:CD8 ratio. No significant CD16/56 expression is identified. The T-cell changes  are not considered specific in this setting. B cells represent the minor population with no monoclonality or abnormal phenotype. 4% lambda light chain restricted plasma cells. FISH positive CCND1/IGH 12% of cells.       11/09/2016 PET scan    No FDG avid myeloma is identified. Numerous osseous lytic lesions as above. 2.Hypermetabolic left level III lymph node, likely reactive etiology.  3.Ancillary CT findings as above.      11/29/2016 -  Chemotherapy    He received treatment with daratumumab and Pomalyst       12/13/2016 - 12/16/2016 Hospital Admission    He was  admitted to the hospital for management of dehydration and early sepsis      01/23/2017 Procedure    Status post CT-guided bone marrow biopsy, with tissue specimen sent to pathology for complete histopathologic analysis      01/23/2017 Bone Marrow Biopsy    Bone Marrow, Aspirate,Biopsy, and Clot - SLIGHTLY HYPOCELLULAR BONE MARROW FOR AGE WITH TRILINEAGE HEMATOPOIESIS AND 1% PLASMA CELLS. - SEE COMMENT. PERIPHERAL BLOOD: - NORMOCYTIC-NORMOCHROMIC ANEMIA. - LEUKOPENIA. Diagnosis Note The bone marrow is slightly hypocellular for age with trilineage hematopoiesis and non specific myeloid changes likely related to previous treatment. The plasma cells represent 1% of all cells in the aspirate with lack of large aggregate or sheets. To further evaluate the plasma cell component, immunohistochemical stains will be performed and the results reported in an addendum. Correlation with cytogenetic and FISH studies recommended.      01/31/2017 Adverse Reaction    Treatment was delayed due to SOB      01/31/2017 Imaging    1. No evidence of pulmonary embolism. Sensitivity is moderately degraded by respiratory motion. 2. Multiple myeloma.  Stable compression fracture at T9. 3. Coronary artery calcifications       INTERVAL HISTORY: Please see below for problem oriented charting. He returns for further follow-up He feels well Denies recent infection, fever or chills He has completed recent dexamethasone taper Denies new bone pain  REVIEW OF SYSTEMS:   Constitutional: Denies fevers, chills or abnormal weight loss Eyes: Denies blurriness of vision Ears, nose, mouth, throat, and face: Denies mucositis or sore throat Respiratory: Denies cough, dyspnea or wheezes Cardiovascular: Denies palpitation, chest discomfort or lower extremity swelling Gastrointestinal:  Denies nausea, heartburn or change in bowel habits Skin: Denies abnormal skin rashes Lymphatics: Denies new lymphadenopathy or easy  bruising Neurological:Denies numbness, tingling or new weaknesses Behavioral/Psych: Mood is stable, no new changes  All other systems were reviewed with the patient and are negative.  I have reviewed the past medical history, past surgical history, social history and family history with the patient and they are unchanged from previous note.  ALLERGIES:  is allergic to benadryl [diphenhydramine]; trazodone and nefazodone; and latex.  MEDICATIONS:  Current Outpatient Prescriptions  Medication Sig Dispense Refill  . acetaminophen (TYLENOL) 500 MG tablet Take 1,000 mg by mouth every 6 (six) hours as needed for fever.    Marland Kitchen acyclovir (ZOVIRAX) 800 MG tablet Take 800 mg by mouth 2 (two) times daily.    Marland Kitchen aluminum-magnesium hydroxide-simethicone (MAALOX) 322-025-42 MG/5ML SUSP Take 30 mLs by mouth every 4 (four) hours as needed.    Marland Kitchen aspirin 81 MG chewable tablet Chew 81 mg by mouth daily.    Marland Kitchen CALCIUM PO Take 1 tablet by mouth daily.    . cholecalciferol (VITAMIN D) 1000 units tablet Take 1,000 Units by mouth daily.     . folic acid (FOLVITE) 1 MG tablet  Take 1 tablet (1 mg total) by mouth daily. 90 tablet 1  . gabapentin (NEURONTIN) 300 MG capsule Take 1-2 capsules (300-600 mg total) by mouth 2 (two) times daily. Take 300 mg in the morning and 600 mg at night. 100 capsule 9  . guaiFENesin-dextromethorphan (ROBITUSSIN DM) 100-10 MG/5ML syrup Take 5 mLs by mouth every 4 (four) hours as needed for cough. 118 mL 0  . hydrochlorothiazide (HYDRODIURIL) 25 MG tablet Take 1 tablet (25 mg total) by mouth daily. 30 tablet 9  . HYDROcodone-homatropine (HYCODAN) 5-1.5 MG/5ML syrup Take 5 mLs by mouth every 6 (six) hours as needed for cough. 240 mL 0  . lidocaine-prilocaine (EMLA) cream Apply to affected area once 30 g 3  . lisinopril (PRINIVIL,ZESTRIL) 20 MG tablet Take 1 tablet (20 mg total) by mouth daily. 30 tablet 6  . loperamide (IMODIUM) 2 MG capsule Take 2 mg by mouth 4 (four) times daily as needed.     . metFORMIN (GLUCOPHAGE) 500 MG tablet Take 1 tablet (500 mg total) by mouth daily with breakfast. 30 tablet 5  . Multiple Vitamin (MULTIVITAMIN) tablet Take 1 tablet by mouth daily.  1  . ondansetron (ZOFRAN) 8 MG tablet Take 8 mg by mouth every 8 (eight) hours as needed.    . pantoprazole (PROTONIX) 40 MG tablet Take 1 tablet (40 mg total) by mouth daily. (Patient not taking: Reported on 12/20/2016) 30 tablet 6  . pomalidomide (POMALYST) 2 MG capsule Take 1 capsule (2 mg total) by mouth daily. Take with water on days 1-21. Repeat every 28 days. 21 capsule 0  . prochlorperazine (COMPAZINE) 10 MG tablet Take 10 mg by mouth every 6 (six) hours as needed.    . ranitidine (ZANTAC) 150 MG tablet Take 1 tablet (150 mg total) by mouth at bedtime. 30 tablet 3  . sulfamethoxazole-trimethoprim (BACTRIM DS,SEPTRA DS) 800-160 MG tablet Take 1 tablet by mouth 3 (three) times a week.  1  . traMADol (ULTRAM) 50 MG tablet TAKE 1 TO 2 TABLETS BY MOUTH EVERY 6 HOURS AS NEEDED FOR PAIN 90 tablet 0  . zolpidem (AMBIEN) 10 MG tablet Take 1 tablet (10 mg total) by mouth at bedtime as needed for sleep. 30 tablet 3   No current facility-administered medications for this visit.    Facility-Administered Medications Ordered in Other Visits  Medication Dose Route Frequency Provider Last Rate Last Dose  . sodium chloride flush (NS) 0.9 % injection 10 mL  10 mL Intracatheter PRN Heath Lark, MD   10 mL at 03/21/17 1660    PHYSICAL EXAMINATION: ECOG PERFORMANCE STATUS: 1 - Symptomatic but completely ambulatory GENERAL:alert, no distress and comfortable SKIN: skin color, texture, turgor are normal, no rashes or significant lesions EYES: normal, Conjunctiva are pink and non-injected, sclera clear OROPHARYNX:no exudate, no erythema and lips, buccal mucosa, and tongue normal  NECK: supple, thyroid normal size, non-tender, without nodularity LYMPH:  no palpable lymphadenopathy in the cervical, axillary or inguinal LUNGS:  clear to auscultation and percussion with normal breathing effort HEART: regular rate & rhythm and no murmurs and no lower extremity edema ABDOMEN:abdomen soft, non-tender and normal bowel sounds Musculoskeletal:no cyanosis of digits and no clubbing  NEURO: alert & oriented x 3 with fluent speech, no focal motor/sensory deficits  LABORATORY DATA:  I have reviewed the data as listed    Component Value Date/Time   NA 146 (H) 03/21/2017 0919   K 3.5 03/21/2017 0919   CL 102 12/16/2016 0418   CO2 25 03/21/2017  0919   GLUCOSE 130 03/21/2017 0919   BUN 15.2 03/21/2017 0919   CREATININE 1.1 03/21/2017 0919   CALCIUM 9.1 03/21/2017 0919   PROT 6.4 03/21/2017 0919   ALBUMIN 3.7 03/21/2017 0919   AST 14 03/21/2017 0919   ALT 16 03/21/2017 0919   ALKPHOS 43 03/21/2017 0919   BILITOT 0.45 03/21/2017 0919   GFRNONAA >60 12/16/2016 0418   GFRNONAA 71 07/24/2015 1155   GFRAA >60 12/16/2016 0418   GFRAA 82 07/24/2015 1155    No results found for: SPEP, UPEP  Lab Results  Component Value Date   WBC 5.3 03/21/2017   NEUTROABS 3.0 03/21/2017   HGB 12.2 (L) 03/21/2017   HCT 36.2 (L) 03/21/2017   MCV 98.2 (H) 03/21/2017   PLT 308 03/21/2017      Chemistry      Component Value Date/Time   NA 146 (H) 03/21/2017 0919   K 3.5 03/21/2017 0919   CL 102 12/16/2016 0418   CO2 25 03/21/2017 0919   BUN 15.2 03/21/2017 0919   CREATININE 1.1 03/21/2017 0919      Component Value Date/Time   CALCIUM 9.1 03/21/2017 0919   ALKPHOS 43 03/21/2017 0919   AST 14 03/21/2017 0919   ALT 16 03/21/2017 0919   BILITOT 0.45 03/21/2017 0919      ASSESSMENT & PLAN:  Multiple myeloma in relapse (HCC) Recent myeloma panel and bone marrow biopsy confirmed that the patient has achieved near complete response to treatment He has recently completed dexamethasone taper He is reminded to take calcium, vitamin D and will receive Zometa every 3 months He will continue antimicrobial treatment with acyclovir and  aspirin for DVT prophylaxis  S/P autologous bone marrow transplantation ([place]) Recent bone marrow biopsy showed complete remission He has received recent vaccination at [facility] He will continue antimicrobial treatment as prescribed  Anemia due to antineoplastic chemotherapy This is likely anemia of chronic disease. The patient denies recent history of bleeding such as epistaxis, hematuria or hematochezia. He is asymptomatic from the anemia. We will observe for now.  He does not require transfusion now.     No orders of the defined types were placed in this encounter.  All questions were answered. The patient knows to call the clinic with any problems, questions or concerns. No barriers to learning was detected. I spent 15 minutes counseling the patient face to face. The total time spent in the appointment was 20 minutes and more than 50% was on counseling and review of test results     Heath Lark, MD 03/23/2017 2:40 PM

## 2017-03-23 NOTE — Assessment & Plan Note (Signed)
This is likely anemia of chronic disease. The patient denies recent history of bleeding such as epistaxis, hematuria or hematochezia. He is asymptomatic from the anemia. We will observe for now.  He does not require transfusion now.   

## 2017-03-23 NOTE — Assessment & Plan Note (Signed)
Recent bone marrow biopsy showed complete remission He has received recent vaccination at [facility] He will continue antimicrobial treatment as prescribed

## 2017-03-24 ENCOUNTER — Telehealth: Payer: Self-pay | Admitting: Hematology and Oncology

## 2017-03-24 NOTE — Telephone Encounter (Signed)
Spoke with patient regarding his upcoming appointments in September.

## 2017-03-27 ENCOUNTER — Telehealth: Payer: Self-pay | Admitting: Hematology and Oncology

## 2017-03-31 ENCOUNTER — Other Ambulatory Visit: Payer: Self-pay

## 2017-03-31 MED ORDER — POMALIDOMIDE 2 MG PO CAPS: 2.0000 mg | ORAL_CAPSULE | Freq: Every day | ORAL | 0 refills | Status: DC

## 2017-04-04 ENCOUNTER — Other Ambulatory Visit (HOSPITAL_BASED_OUTPATIENT_CLINIC_OR_DEPARTMENT_OTHER): Payer: Medicaid Other

## 2017-04-04 ENCOUNTER — Other Ambulatory Visit: Payer: Self-pay | Admitting: Hematology and Oncology

## 2017-04-04 ENCOUNTER — Ambulatory Visit (HOSPITAL_BASED_OUTPATIENT_CLINIC_OR_DEPARTMENT_OTHER): Payer: Medicaid Other

## 2017-04-04 VITALS — BP 142/91 | HR 84 | Temp 97.2°F | Resp 18

## 2017-04-04 DIAGNOSIS — C9002 Multiple myeloma in relapse: Secondary | ICD-10-CM

## 2017-04-04 DIAGNOSIS — C9001 Multiple myeloma in remission: Secondary | ICD-10-CM

## 2017-04-04 DIAGNOSIS — Z5112 Encounter for antineoplastic immunotherapy: Secondary | ICD-10-CM

## 2017-04-04 LAB — COMPREHENSIVE METABOLIC PANEL WITH GFR
ALT: 12 U/L (ref 0–55)
AST: 13 U/L (ref 5–34)
Albumin: 3.3 g/dL — ABNORMAL LOW (ref 3.5–5.0)
Alkaline Phosphatase: 45 U/L (ref 40–150)
Anion Gap: 6 meq/L (ref 3–11)
BUN: 8.5 mg/dL (ref 7.0–26.0)
CO2: 26 meq/L (ref 22–29)
Calcium: 9.3 mg/dL (ref 8.4–10.4)
Chloride: 108 meq/L (ref 98–109)
Creatinine: 1.1 mg/dL (ref 0.7–1.3)
EGFR: 85 mL/min/{1.73_m2} — ABNORMAL LOW
Glucose: 108 mg/dL (ref 70–140)
Potassium: 3.8 meq/L (ref 3.5–5.1)
Sodium: 139 meq/L (ref 136–145)
Total Bilirubin: 0.48 mg/dL (ref 0.20–1.20)
Total Protein: 6.2 g/dL — ABNORMAL LOW (ref 6.4–8.3)

## 2017-04-04 LAB — CBC WITH DIFFERENTIAL/PLATELET
BASO%: 1.1 % (ref 0.0–2.0)
Basophils Absolute: 0.1 10*3/uL (ref 0.0–0.1)
EOS%: 8.5 % — ABNORMAL HIGH (ref 0.0–7.0)
Eosinophils Absolute: 0.6 10*3/uL — ABNORMAL HIGH (ref 0.0–0.5)
HCT: 36.5 % — ABNORMAL LOW (ref 38.4–49.9)
HGB: 12.6 g/dL — ABNORMAL LOW (ref 13.0–17.1)
LYMPH%: 21.7 % (ref 14.0–49.0)
MCH: 33.6 pg — ABNORMAL HIGH (ref 27.2–33.4)
MCHC: 34.4 g/dL (ref 32.0–36.0)
MCV: 97.6 fL (ref 79.3–98.0)
MONO#: 0.6 10*3/uL (ref 0.1–0.9)
MONO%: 9.4 % (ref 0.0–14.0)
NEUT#: 4 10*3/uL (ref 1.5–6.5)
NEUT%: 59.3 % (ref 39.0–75.0)
Platelets: 210 10*3/uL (ref 140–400)
RBC: 3.74 10*6/uL — ABNORMAL LOW (ref 4.20–5.82)
RDW: 16.1 % — ABNORMAL HIGH (ref 11.0–14.6)
WBC: 6.7 10*3/uL (ref 4.0–10.3)
lymph#: 1.5 10*3/uL (ref 0.9–3.3)

## 2017-04-04 MED ORDER — DARATUMUMAB CHEMO INJECTION 400 MG/20ML
15.8000 mg/kg | Freq: Once | INTRAVENOUS | Status: AC
  Administered 2017-04-04: 1200 mg via INTRAVENOUS
  Filled 2017-04-04: qty 60

## 2017-04-04 MED ORDER — ACETAMINOPHEN 325 MG PO TABS
ORAL_TABLET | ORAL | Status: AC
  Filled 2017-04-04: qty 2

## 2017-04-04 MED ORDER — SODIUM CHLORIDE 0.9 % IV SOLN
Freq: Once | INTRAVENOUS | Status: AC
  Administered 2017-04-04: 09:00:00 via INTRAVENOUS

## 2017-04-04 MED ORDER — MONTELUKAST SODIUM 10 MG PO TABS
ORAL_TABLET | ORAL | Status: AC
  Filled 2017-04-04: qty 1

## 2017-04-04 MED ORDER — SODIUM CHLORIDE 0.9 % IV SOLN: 10.0000 mg | Freq: Once | INTRAVENOUS | Status: DC

## 2017-04-04 MED ORDER — MONTELUKAST SODIUM 10 MG PO TABS
10.0000 mg | ORAL_TABLET | Freq: Once | ORAL | Status: AC
  Administered 2017-04-04: 10 mg via ORAL

## 2017-04-04 MED ORDER — ACETAMINOPHEN 325 MG PO TABS
650.0000 mg | ORAL_TABLET | Freq: Once | ORAL | Status: AC
  Administered 2017-04-04: 650 mg via ORAL

## 2017-04-04 MED ORDER — HEPARIN SOD (PORK) LOCK FLUSH 100 UNIT/ML IV SOLN
500.0000 [IU] | Freq: Once | INTRAVENOUS | Status: AC | PRN
  Administered 2017-04-04: 500 [IU]
  Filled 2017-04-04: qty 5

## 2017-04-04 MED ORDER — FAMOTIDINE IN NACL 20-0.9 MG/50ML-% IV SOLN
INTRAVENOUS | Status: AC
  Filled 2017-04-04: qty 50

## 2017-04-04 MED ORDER — DEXAMETHASONE SODIUM PHOSPHATE 10 MG/ML IJ SOLN
INTRAMUSCULAR | Status: AC
  Filled 2017-04-04: qty 1

## 2017-04-04 MED ORDER — SODIUM CHLORIDE 0.9% FLUSH
10.0000 mL | INTRAVENOUS | Status: DC | PRN
  Administered 2017-04-04: 10 mL
  Filled 2017-04-04: qty 10

## 2017-04-04 MED ORDER — DEXAMETHASONE SODIUM PHOSPHATE 10 MG/ML IJ SOLN
10.0000 mg | Freq: Once | INTRAMUSCULAR | Status: AC
  Administered 2017-04-04: 10 mg via INTRAVENOUS

## 2017-04-04 MED ORDER — PROCHLORPERAZINE MALEATE 10 MG PO TABS
ORAL_TABLET | ORAL | Status: AC
  Filled 2017-04-04: qty 1

## 2017-04-04 MED ORDER — FAMOTIDINE IN NACL 20-0.9 MG/50ML-% IV SOLN
20.0000 mg | Freq: Once | INTRAVENOUS | Status: AC
  Administered 2017-04-04: 20 mg via INTRAVENOUS

## 2017-04-04 MED ORDER — PROCHLORPERAZINE MALEATE 10 MG PO TABS
10.0000 mg | ORAL_TABLET | Freq: Once | ORAL | Status: AC
  Administered 2017-04-04: 10 mg via ORAL

## 2017-04-04 NOTE — Patient Instructions (Signed)
[facility] Discharge Instructions for Patients Receiving Chemotherapy  Today you received the following chemotherapy agents :  Darzalex  To help prevent nausea and vomiting after your treatment, we encourage you to take your nausea medication as prescribed.   If you develop nausea and vomiting that is not controlled by your nausea medication, call the clinic.   BELOW ARE SYMPTOMS THAT SHOULD BE REPORTED IMMEDIATELY:  *FEVER GREATER THAN 100.5 F  *CHILLS WITH OR WITHOUT FEVER  NAUSEA AND VOMITING THAT IS NOT CONTROLLED WITH YOUR NAUSEA MEDICATION  *UNUSUAL SHORTNESS OF BREATH  *UNUSUAL BRUISING OR BLEEDING  TENDERNESS IN MOUTH AND THROAT WITH OR WITHOUT PRESENCE OF ULCERS  *URINARY PROBLEMS  *BOWEL PROBLEMS  UNUSUAL RASH Items with * indicate a potential emergency and should be followed up as soon as possible.  Feel free to call the clinic you have any questions or concerns. The clinic phone number is [phone].  Please show the CHEMO ALERT CARD at check-in to the Emergency Department and triage nurse.   

## 2017-04-05 LAB — KAPPA/LAMBDA LIGHT CHAINS
IG LAMBDA FREE LIGHT CHAIN: 6.8 mg/L (ref 5.7–26.3)
Ig Kappa Free Light Chain: 4.2 mg/L (ref 3.3–19.4)
KAPPA/LAMBDA FLC RATIO: 0.62 (ref 0.26–1.65)

## 2017-04-06 LAB — PROTEIN ELECTROPHORESIS, SERUM
A/G Ratio: 1.3 (ref 0.7–1.7)
ALPHA 2: 0.9 g/dL (ref 0.4–1.0)
Albumin: 3.3 g/dL (ref 2.9–4.4)
Alpha 1: 0.3 g/dL (ref 0.0–0.4)
Beta: 1 g/dL (ref 0.7–1.3)
GLOBULIN, TOTAL: 2.6 g/dL (ref 2.2–3.9)
Gamma Globulin: 0.4 g/dL (ref 0.4–1.8)
M-Spike, %: 0.1 g/dL — ABNORMAL HIGH
TOTAL PROTEIN: 5.9 g/dL — AB (ref 6.0–8.5)

## 2017-04-07 ENCOUNTER — Encounter: Payer: Self-pay | Admitting: Family Medicine

## 2017-04-07 ENCOUNTER — Ambulatory Visit: Payer: Medicaid Other | Attending: Family Medicine | Admitting: Family Medicine

## 2017-04-07 VITALS — BP 142/83 | HR 61 | Temp 98.7°F | Resp 18 | Ht 66.0 in | Wt 175.0 lb

## 2017-04-07 DIAGNOSIS — Z79899 Other long term (current) drug therapy: Secondary | ICD-10-CM | POA: Diagnosis not present

## 2017-04-07 DIAGNOSIS — M546 Pain in thoracic spine: Secondary | ICD-10-CM | POA: Diagnosis present

## 2017-04-07 DIAGNOSIS — M545 Low back pain, unspecified: Secondary | ICD-10-CM

## 2017-04-07 DIAGNOSIS — T451X5A Adverse effect of antineoplastic and immunosuppressive drugs, initial encounter: Secondary | ICD-10-CM

## 2017-04-07 DIAGNOSIS — R600 Localized edema: Secondary | ICD-10-CM | POA: Insufficient documentation

## 2017-04-07 DIAGNOSIS — I10 Essential (primary) hypertension: Secondary | ICD-10-CM | POA: Diagnosis not present

## 2017-04-07 DIAGNOSIS — Z7984 Long term (current) use of oral hypoglycemic drugs: Secondary | ICD-10-CM | POA: Diagnosis not present

## 2017-04-07 DIAGNOSIS — R7303 Prediabetes: Secondary | ICD-10-CM | POA: Diagnosis not present

## 2017-04-07 DIAGNOSIS — C9000 Multiple myeloma not having achieved remission: Secondary | ICD-10-CM | POA: Insufficient documentation

## 2017-04-07 DIAGNOSIS — C9002 Multiple myeloma in relapse: Secondary | ICD-10-CM

## 2017-04-07 DIAGNOSIS — Z7982 Long term (current) use of aspirin: Secondary | ICD-10-CM | POA: Diagnosis not present

## 2017-04-07 DIAGNOSIS — K219 Gastro-esophageal reflux disease without esophagitis: Secondary | ICD-10-CM | POA: Insufficient documentation

## 2017-04-07 DIAGNOSIS — G620 Drug-induced polyneuropathy: Secondary | ICD-10-CM | POA: Insufficient documentation

## 2017-04-07 NOTE — Progress Notes (Signed)
Subjective:  Patient ID: Erik Nguyen, male    DOB: February 06, 1956  Age: 61 y.o. MRN: [id number]  CC: Back Pain   HPI Erik Nguyen is a  61 year old male with a history of prediabetes (A1c 6.0), hypertension, multiple myeloma (status post autologous stem cell transplant in 07/2016), chemotherapy-induced neuropathy, GERD who presents today for a follow-up visit.  He complains of right-sided thoracolumbar back pain which he noticed last night and is unsure if it is related to his sleeping position or his mattress. He does have chronic thoracolumbar pain for which he takes tramadol but this feels a little bit different.  He has not been taking his metformin because his blood sugars have been controlled he states. He denies hypoglycemia.  His Infusions are at the cancer center every other week and he reports feeling well overall; he denies bone pain. Last visit to oncology was on 03/21/17 and he seems to have achieved near-complete response to treatment as per notes.  He has noticed some ankle edema which he states is chronic and worse at the end of the day. Denies shortness of breath or chest pains.  Past Medical History:  Diagnosis Date  . Dyspnea    exertion   . Encounter for antineoplastic chemotherapy 02/09/2016  . GERD (gastroesophageal reflux disease)   . History of syphilis 02/24/2016  . Hypertension Dx 2016  . Multiple myeloma not having achieved remission ([place]) 09/01/2015  . Pre-diabetes   . Priapism 2014     Past Surgical History:  Procedure Laterality Date  . COLONOSCOPY    . POLYPECTOMY      Allergies  Allergen Reactions  . Benadryl [Diphenhydramine] Other (See Comments)    priapism  . Trazodone And Nefazodone     priapism  . Latex Itching and Rash     Outpatient Medications Prior to Visit  Medication Sig Dispense Refill  . acetaminophen (TYLENOL) 500 MG tablet Take 1,000 mg by mouth every 6 (six) hours as needed for fever.    Marland Kitchen acyclovir (ZOVIRAX) 800 MG tablet  Take 800 mg by mouth 2 (two) times daily.    Marland Kitchen aluminum-magnesium hydroxide-simethicone (MAALOX) 086-578-46 MG/5ML SUSP Take 30 mLs by mouth every 4 (four) hours as needed.    Marland Kitchen aspirin 81 MG chewable tablet Chew 81 mg by mouth daily.    Marland Kitchen CALCIUM PO Take 1 tablet by mouth daily.    . cholecalciferol (VITAMIN D) 1000 units tablet Take 1,000 Units by mouth daily.     . folic acid (FOLVITE) 1 MG tablet Take 1 tablet (1 mg total) by mouth daily. 90 tablet 1  . gabapentin (NEURONTIN) 300 MG capsule Take 1-2 capsules (300-600 mg total) by mouth 2 (two) times daily. Take 300 mg in the morning and 600 mg at night. 100 capsule 9  . hydrochlorothiazide (HYDRODIURIL) 25 MG tablet Take 1 tablet (25 mg total) by mouth daily. 30 tablet 9  . lidocaine-prilocaine (EMLA) cream Apply to affected area once 30 g 3  . lisinopril (PRINIVIL,ZESTRIL) 20 MG tablet Take 1 tablet (20 mg total) by mouth daily. 30 tablet 6  . loperamide (IMODIUM) 2 MG capsule Take 2 mg by mouth 4 (four) times daily as needed.    . metFORMIN (GLUCOPHAGE) 500 MG tablet Take 1 tablet (500 mg total) by mouth daily with breakfast. 30 tablet 5  . Multiple Vitamin (MULTIVITAMIN) tablet Take 1 tablet by mouth daily.  1  . ondansetron (ZOFRAN) 8 MG tablet Take 8 mg by mouth every 8 (  eight) hours as needed.    . pantoprazole (PROTONIX) 40 MG tablet Take 1 tablet (40 mg total) by mouth daily. 30 tablet 6  . pomalidomide (POMALYST) 2 MG capsule Take 1 capsule (2 mg total) by mouth daily. Take with water on days 1-21. Repeat every 28 days. 21 capsule 0  . prochlorperazine (COMPAZINE) 10 MG tablet Take 10 mg by mouth every 6 (six) hours as needed.    . ranitidine (ZANTAC) 150 MG tablet Take 1 tablet (150 mg total) by mouth at bedtime. 30 tablet 3  . traMADol (ULTRAM) 50 MG tablet TAKE 1 TO 2 TABLETS BY MOUTH EVERY 6 HOURS AS NEEDED FOR PAIN 90 tablet 0  . sulfamethoxazole-trimethoprim (BACTRIM DS,SEPTRA DS) 800-160 MG tablet Take 1 tablet by mouth 3  (three) times a week.  1  . zolpidem (AMBIEN) 10 MG tablet Take 1 tablet (10 mg total) by mouth at bedtime as needed for sleep. 30 tablet 3  . guaiFENesin-dextromethorphan (ROBITUSSIN DM) 100-10 MG/5ML syrup Take 5 mLs by mouth every 4 (four) hours as needed for cough. 118 mL 0  . HYDROcodone-homatropine (HYCODAN) 5-1.5 MG/5ML syrup Take 5 mLs by mouth every 6 (six) hours as needed for cough. 240 mL 0   Facility-Administered Medications Prior to Visit  Medication Dose Route Frequency Provider Last Rate Last Dose  . sodium chloride flush (NS) 0.9 % injection 10 mL  10 mL Intracatheter PRN Alvy Bimler, Ni, MD   10 mL at 03/21/17 0924    ROS Review of Systems  Constitutional: Negative for activity change and appetite change.  HENT: Negative for sinus pressure and sore throat.   Eyes: Negative for visual disturbance.  Respiratory: Negative for cough, chest tightness and shortness of breath.   Cardiovascular: Positive for leg swelling. Negative for chest pain.  Gastrointestinal: Negative for abdominal distention, abdominal pain, constipation and diarrhea.  Endocrine: Negative.   Genitourinary: Negative for dysuria.  Musculoskeletal: Positive for back pain. Negative for joint swelling and myalgias.  Skin: Negative for rash.  Allergic/Immunologic: Negative.   Neurological: Negative for weakness, light-headedness and numbness.  Psychiatric/Behavioral: Negative for dysphoric mood and suicidal ideas.    Objective:  BP (!) 142/83 (BP Location: Left Arm, Patient Position: Sitting, Cuff Size: Normal)   Pulse 61   Temp 98.7 F (37.1 C) (Oral)   Resp 18   Ht 5' 6"  (1.676 m)   Wt 175 lb (79.4 kg)   SpO2 98%   BMI 28.25 kg/m   BP/Weight 04/07/2017 1/50/5697 04/19/8015  Systolic BP 553 748 270  Diastolic BP 83 91 96  Wt. (Lbs) 175 - -  BMI 28.25 - -      Physical Exam Constitutional: He is oriented to person, place, and time. He appears well-developed and well-nourished.  Cardiovascular:  Normal rate, normal heart sounds and intact distal pulses.   No murmur heard. Pulmonary/Chest: Effort normal and breath sounds normal. He has no wheezes. He has no rales. He exhibits no tenderness.  Abdominal: Soft. Bowel sounds are normal. He exhibits no distension and no mass. There is no tenderness.  Musculoskeletal: Normal range of motion.  Neurological: He is alert and oriented to person, place, and time.  Skin: Skin is warm and dry.  Psychiatric: He has a normal mood and affect.   Lab Results  Component Value Date   HGBA1C 6.0 (H) 12/15/2016    Assessment & Plan:   1. Prediabetes Diet controlled with A1c of 6.0 He has not been needing metformin as his sugars  have been controlled Likely due to the fact that he has completed his dexamethasone taper.  2. Peripheral neuropathy due to chemotherapy ([place]) Controlled on gabapentin  3. Multiple myeloma  (HCC) Currently receiving treatments every other week Keep appointment with oncology  4. Thoracolumbar back pain Advised to apply heat He does have Tramadol  5. Essential hypertension Slightly elevated above goal of less than 130/80 No regimen changed today Low-sodium, DASH diet, lifestyle modifications  6. Pedal edema Minimal ankle edema which could be dependent Use compression stockings, elevate feet, low-sodium diet   No orders of the defined types were placed in this encounter.   Follow-up: Return in about 3 months (around 07/08/2017) for follow up of chronic medical conditions.   Arnoldo Morale MD

## 2017-04-07 NOTE — Patient Instructions (Signed)
Edema Edema is when you have too much fluid in your body or under your skin. Edema may make your legs, feet, and ankles swell up. Swelling is also common in looser tissues, like around your eyes. This is a common condition. It gets more common as you get older. There are many possible causes of edema. Eating too much salt (sodium) and being on your feet or sitting for a long time can cause edema in your legs, feet, and ankles. Hot weather may make edema worse. Edema is usually painless. Your skin may look swollen or shiny. Follow these instructions at home:  Keep the swollen body part raised (elevated) above the level of your heart when you are sitting or lying down.  Do not sit still or stand for a long time.  Do not wear tight clothes. Do not wear garters on your upper legs.  Exercise your legs. This can help the swelling go down.  Wear elastic bandages or support stockings as told by your doctor.  Eat a low-salt (low-sodium) diet to reduce fluid as told by your doctor.  Depending on the cause of your swelling, you may need to limit how much fluid you drink (fluid restriction).  Take over-the-counter and prescription medicines only as told by your doctor. Contact a doctor if:  Treatment is not working.  You have heart, liver, or kidney disease and have symptoms of edema.  You have sudden and unexplained weight gain. Get help right away if:  You have shortness of breath or chest pain.  You cannot breathe when you lie down.  You have pain, redness, or warmth in the swollen areas.  You have heart, liver, or kidney disease and get edema all of a sudden.  You have a fever and your symptoms get worse all of a sudden. Summary  Edema is when you have too much fluid in your body or under your skin.  Edema may make your legs, feet, and ankles swell up. Swelling is also common in looser tissues, like around your eyes.  Raise (elevate) the swollen body part above the level of your  heart when you are sitting or lying down.  Follow your doctor's instructions about diet and how much fluid you can drink (fluid restriction). This information is not intended to replace advice given to you by your health care provider. Make sure you discuss any questions you have with your health care provider. Document Released: 01/18/2008 Document Revised: 08/19/2016 Document Reviewed: 08/19/2016 Elsevier Interactive Patient Education  2017 Elsevier Inc.  

## 2017-04-18 ENCOUNTER — Other Ambulatory Visit: Payer: Self-pay | Admitting: Hematology and Oncology

## 2017-04-19 ENCOUNTER — Ambulatory Visit: Payer: Medicaid Other | Admitting: Hematology and Oncology

## 2017-04-19 ENCOUNTER — Ambulatory Visit: Payer: Medicaid Other

## 2017-04-19 ENCOUNTER — Other Ambulatory Visit: Payer: Medicaid Other

## 2017-04-25 ENCOUNTER — Ambulatory Visit: Payer: Medicaid Other

## 2017-04-25 ENCOUNTER — Ambulatory Visit (HOSPITAL_BASED_OUTPATIENT_CLINIC_OR_DEPARTMENT_OTHER): Payer: Medicaid Other

## 2017-04-25 ENCOUNTER — Encounter: Payer: Self-pay | Admitting: Hematology and Oncology

## 2017-04-25 ENCOUNTER — Other Ambulatory Visit: Payer: Self-pay | Admitting: Hematology and Oncology

## 2017-04-25 ENCOUNTER — Ambulatory Visit (HOSPITAL_BASED_OUTPATIENT_CLINIC_OR_DEPARTMENT_OTHER): Payer: Medicaid Other | Admitting: Hematology and Oncology

## 2017-04-25 ENCOUNTER — Other Ambulatory Visit: Payer: Self-pay | Admitting: *Deleted

## 2017-04-25 ENCOUNTER — Telehealth: Payer: Self-pay

## 2017-04-25 ENCOUNTER — Other Ambulatory Visit (HOSPITAL_BASED_OUTPATIENT_CLINIC_OR_DEPARTMENT_OTHER): Payer: Medicaid Other

## 2017-04-25 ENCOUNTER — Telehealth: Payer: Self-pay | Admitting: Hematology and Oncology

## 2017-04-25 VITALS — BP 135/89 | HR 88 | Temp 98.0°F | Resp 18

## 2017-04-25 DIAGNOSIS — M898X9 Other specified disorders of bone, unspecified site: Secondary | ICD-10-CM

## 2017-04-25 DIAGNOSIS — Z5112 Encounter for antineoplastic immunotherapy: Secondary | ICD-10-CM | POA: Diagnosis present

## 2017-04-25 DIAGNOSIS — Z9481 Bone marrow transplant status: Secondary | ICD-10-CM

## 2017-04-25 DIAGNOSIS — C9002 Multiple myeloma in relapse: Secondary | ICD-10-CM

## 2017-04-25 DIAGNOSIS — Z8042 Family history of malignant neoplasm of prostate: Secondary | ICD-10-CM

## 2017-04-25 DIAGNOSIS — G620 Drug-induced polyneuropathy: Secondary | ICD-10-CM | POA: Diagnosis not present

## 2017-04-25 DIAGNOSIS — D61810 Antineoplastic chemotherapy induced pancytopenia: Secondary | ICD-10-CM

## 2017-04-25 DIAGNOSIS — C9001 Multiple myeloma in remission: Secondary | ICD-10-CM

## 2017-04-25 DIAGNOSIS — G893 Neoplasm related pain (acute) (chronic): Secondary | ICD-10-CM | POA: Diagnosis not present

## 2017-04-25 DIAGNOSIS — T451X5A Adverse effect of antineoplastic and immunosuppressive drugs, initial encounter: Secondary | ICD-10-CM

## 2017-04-25 LAB — CBC WITH DIFFERENTIAL/PLATELET
BASO%: 2.2 % — ABNORMAL HIGH (ref 0.0–2.0)
BASOS ABS: 0.1 10*3/uL (ref 0.0–0.1)
EOS ABS: 0.5 10*3/uL (ref 0.0–0.5)
EOS%: 16.6 % — ABNORMAL HIGH (ref 0.0–7.0)
HCT: 38.9 % (ref 38.4–49.9)
HEMOGLOBIN: 12.9 g/dL — AB (ref 13.0–17.1)
LYMPH#: 1 10*3/uL (ref 0.9–3.3)
LYMPH%: 32.3 % (ref 14.0–49.0)
MCH: 31.8 pg (ref 27.2–33.4)
MCHC: 33.2 g/dL (ref 32.0–36.0)
MCV: 95.8 fL (ref 79.3–98.0)
MONO#: 0.6 10*3/uL (ref 0.1–0.9)
MONO%: 17.2 % — ABNORMAL HIGH (ref 0.0–14.0)
NEUT%: 31.7 % — ABNORMAL LOW (ref 39.0–75.0)
NEUTROS ABS: 1 10*3/uL — AB (ref 1.5–6.5)
Platelets: 257 10*3/uL (ref 140–400)
RBC: 4.06 10*6/uL — AB (ref 4.20–5.82)
RDW: 13.7 % (ref 11.0–14.6)
WBC: 3.2 10*3/uL — ABNORMAL LOW (ref 4.0–10.3)

## 2017-04-25 LAB — COMPREHENSIVE METABOLIC PANEL
ALT: 20 U/L (ref 0–55)
ANION GAP: 9 meq/L (ref 3–11)
AST: 14 U/L (ref 5–34)
Albumin: 3.3 g/dL — ABNORMAL LOW (ref 3.5–5.0)
Alkaline Phosphatase: 76 U/L (ref 40–150)
BUN: 13.1 mg/dL (ref 7.0–26.0)
CALCIUM: 9.7 mg/dL (ref 8.4–10.4)
CHLORIDE: 108 meq/L (ref 98–109)
CO2: 24 meq/L (ref 22–29)
Creatinine: 1.1 mg/dL (ref 0.7–1.3)
EGFR: 82 mL/min/{1.73_m2} — ABNORMAL LOW (ref >90)
Glucose: 121 mg/dl (ref 70–140)
POTASSIUM: 3.8 meq/L (ref 3.5–5.1)
Sodium: 140 mEq/L (ref 136–145)
Total Bilirubin: 0.35 mg/dL (ref 0.20–1.20)
Total Protein: 6.6 g/dL (ref 6.4–8.3)

## 2017-04-25 MED ORDER — SODIUM CHLORIDE 0.9% FLUSH
10.0000 mL | Freq: Once | INTRAVENOUS | Status: DC
  Filled 2017-04-25: qty 10

## 2017-04-25 MED ORDER — PROCHLORPERAZINE MALEATE 10 MG PO TABS
ORAL_TABLET | ORAL | Status: AC
  Filled 2017-04-25: qty 1

## 2017-04-25 MED ORDER — MONTELUKAST SODIUM 10 MG PO TABS
ORAL_TABLET | ORAL | Status: AC
  Filled 2017-04-25: qty 1

## 2017-04-25 MED ORDER — TRAMADOL HCL 50 MG PO TABS: 50.0000 mg | ORAL_TABLET | Freq: Four times a day (QID) | ORAL | 0 refills | Status: AC | PRN

## 2017-04-25 MED ORDER — DEXAMETHASONE SODIUM PHOSPHATE 10 MG/ML IJ SOLN
INTRAMUSCULAR | Status: AC
  Filled 2017-04-25: qty 1

## 2017-04-25 MED ORDER — MONTELUKAST SODIUM 10 MG PO TABS
10.0000 mg | ORAL_TABLET | Freq: Once | ORAL | Status: AC
  Administered 2017-04-25: 10 mg via ORAL

## 2017-04-25 MED ORDER — HEPARIN SOD (PORK) LOCK FLUSH 100 UNIT/ML IV SOLN
500.0000 [IU] | Freq: Once | INTRAVENOUS | Status: AC | PRN
  Administered 2017-04-25: 500 [IU]
  Filled 2017-04-25: qty 5

## 2017-04-25 MED ORDER — FAMOTIDINE IN NACL 20-0.9 MG/50ML-% IV SOLN
20.0000 mg | Freq: Once | INTRAVENOUS | Status: AC
  Administered 2017-04-25: 20 mg via INTRAVENOUS

## 2017-04-25 MED ORDER — DEXAMETHASONE SODIUM PHOSPHATE 10 MG/ML IJ SOLN
10.0000 mg | Freq: Once | INTRAMUSCULAR | Status: AC
  Administered 2017-04-25: 10 mg via INTRAVENOUS

## 2017-04-25 MED ORDER — FAMOTIDINE IN NACL 20-0.9 MG/50ML-% IV SOLN
INTRAVENOUS | Status: AC
  Filled 2017-04-25: qty 50

## 2017-04-25 MED ORDER — SODIUM CHLORIDE 0.9 % IV SOLN
Freq: Once | INTRAVENOUS | Status: AC
  Administered 2017-04-25: 10:00:00 via INTRAVENOUS

## 2017-04-25 MED ORDER — ACETAMINOPHEN 325 MG PO TABS
650.0000 mg | ORAL_TABLET | Freq: Once | ORAL | Status: AC
  Administered 2017-04-25: 650 mg via ORAL

## 2017-04-25 MED ORDER — SODIUM CHLORIDE 0.9 % IV SOLN
15.8000 mg/kg | Freq: Once | INTRAVENOUS | Status: AC
  Administered 2017-04-25: 1200 mg via INTRAVENOUS
  Filled 2017-04-25: qty 60

## 2017-04-25 MED ORDER — POMALIDOMIDE 2 MG PO CAPS: 2.0000 mg | ORAL_CAPSULE | Freq: Every day | ORAL | 0 refills | Status: DC

## 2017-04-25 MED ORDER — PROCHLORPERAZINE MALEATE 10 MG PO TABS
10.0000 mg | ORAL_TABLET | Freq: Once | ORAL | Status: AC
  Administered 2017-04-25: 10 mg via ORAL

## 2017-04-25 MED ORDER — SODIUM CHLORIDE 0.9% FLUSH
10.0000 mL | INTRAVENOUS | Status: DC | PRN
  Administered 2017-04-25: 10 mL
  Filled 2017-04-25: qty 10

## 2017-04-25 MED ORDER — ACETAMINOPHEN 325 MG PO TABS
ORAL_TABLET | ORAL | Status: AC
  Filled 2017-04-25: qty 2

## 2017-04-25 MED ORDER — ZOLPIDEM TARTRATE 10 MG PO TABS: 10.0000 mg | ORAL_TABLET | Freq: Every evening | ORAL | 3 refills | Status: AC | PRN

## 2017-04-25 MED ORDER — SODIUM CHLORIDE 0.9 % IV SOLN: 10.0000 mg | Freq: Once | INTRAVENOUS | Status: DC

## 2017-04-25 NOTE — Patient Instructions (Signed)
[facility] Discharge Instructions for Patients Receiving Chemotherapy  Today you received the following chemotherapy agents Darzalex.  To help prevent nausea and vomiting after your treatment, we encourage you to take your nausea medication as directed.  If you develop nausea and vomiting that is not controlled by your nausea medication, call the clinic.   BELOW ARE SYMPTOMS THAT SHOULD BE REPORTED IMMEDIATELY:  *FEVER GREATER THAN 100.5 F  *CHILLS WITH OR WITHOUT FEVER  NAUSEA AND VOMITING THAT IS NOT CONTROLLED WITH YOUR NAUSEA MEDICATION  *UNUSUAL SHORTNESS OF BREATH  *UNUSUAL BRUISING OR BLEEDING  TENDERNESS IN MOUTH AND THROAT WITH OR WITHOUT PRESENCE OF ULCERS  *URINARY PROBLEMS  *BOWEL PROBLEMS  UNUSUAL RASH Items with * indicate a potential emergency and should be followed up as soon as possible.  Feel free to call the clinic you have any questions or concerns. The clinic phone number is [phone].  Please show the CHEMO ALERT CARD at check-in to the Emergency Department and triage nurse.    

## 2017-04-25 NOTE — Telephone Encounter (Signed)
Spoke with patient re next appointment for 9/25. Patient will get updated schedule at next visit. . Duration of dara infusion changed 9/11 scheduling message request.

## 2017-04-25 NOTE — Assessment & Plan Note (Signed)
This is likely due to recent treatment. The patient denies recent history of bleeding such as epistaxis, hematuria or hematochezia. He is asymptomatic from the anemia and lekopenia. I will observe for now.

## 2017-04-25 NOTE — Assessment & Plan Note (Signed)
Recent bone marrow biopsy showed complete remission He has received recent vaccination at [facility] He will continue antimicrobial treatment as prescribed

## 2017-04-25 NOTE — Telephone Encounter (Signed)
Spoke with patient unable to decrease infusion time.  No written orders. Per 9/11 los

## 2017-04-25 NOTE — Progress Notes (Signed)
[place] OFFICE PROGRESS NOTE  Patient Care Team: Arnoldo Morale, MD as PCP - General (Family Medicine) Melburn Hake, Costella Hatcher, MD as Consulting Physician (Hematology and Oncology)  SUMMARY OF ONCOLOGIC HISTORY:   Multiple myeloma in relapse [facility])   08/28/2015 Bone Marrow Biopsy    Accession: [id number] biopsy showed 58% myeloma involvement. Cytogenetics was normal and FISH showed Del 13q and t(11;14).       09/07/2015 - 01/22/2016 Chemotherapy    he received 6 cycles of treatment of Velcade and Revlimid with Dex       09/12/2015 - 09/16/2015 Hospital Admission    He was admitted to the hospital with sepsis, treatment was placed on hold and he received one dose of GCSF for leukopenia      01/12/2016 Adverse Reaction    Delay treatment due to neutropenia      01/27/2016 Imaging    ECHO at [facility] showed normal EF      01/27/2016 PET scan    PET scan at [facility] showed innumerable lytic lesions throughout      01/27/2016 Procedure    PFT is within normal limits at [facility]      01/28/2016 Bone Marrow Biopsy    Bone marrow at [facility] showed persistent plasma cell myeloma in a normocellularmarrow (30%) with 20% atypical plasma cells and myeloid hyperplasia.      02/11/2016 - 06/10/2016 Chemotherapy    He is started on salvage Rx with Kyprolis, Cytoxan and dexamethasone      03/24/2016 Imaging    US venous Doppler showed no evidence of deep vein or superficial thrombosis involving the right lower extremity and left lower extremity. No evidence of Baker&'s cyst on the right or left.      05/18/2016 - 05/19/2016 Hospital Admission    He was admitted for chest pain evaluation. Cardiac work-up excluded cardiac events and CT angiogram were negative for PE. It showed esphageal thickening c/w reflux esophagitis likely cause for his chest discomfort      05/23/2016 - 05/25/2016 Hospital Admission    Patient was hospitalized for worsening shortness of breath and  worsening pain. CT angiogram chest negative for pulmonary emboli. Patient had a recent normal 2-D echo and ruled out for MI. Patient noted to be dehydrated. Patient admitted placed on IV fluids. Diuretic discontinued. Patient's gabapentin has been increased per recommendations from prior office visit from patient's oncologist to 600 mg 3 times daily      06/15/2016 Bone Marrow Biopsy    Bone marrow biopsy showed only 2% plasma cell, normal FISH and cytogenetics      07/26/2016 - 07/26/2016 Chemotherapy    He received high dose melphalan at [facility]       07/27/2016 Bone Marrow Transplant    He received autologous stem cell transplant at [facility]      08/05/2016 - 08/10/2016 Hospital Admission    He was admitted to [facility] for management of neutropenic fever and diarrhea. Cultures were negative. Stool culture came back positive for Novovirus and he was managed conservatively.      11/03/2016 Bone Marrow Biopsy    Bone Marrow Flow Cytometry - NO MONOCLONAL B CELL POPULATION IDENTIFIED. - PREDOMINANCE OF T CELLS WITH NON SPECIFIC CHANGES. - SEE NOTE. Diagnosis Comment: Analysis of the lymphoid population shows predominance of T lymphocytes expressing pan T-cell antigens but with relative abundance of CD8 positive cells and reversal of the CD4:CD8 ratio. No significant CD16/56 expression is identified. The T-cell changes  are not considered specific in this setting. B cells represent the minor population with no monoclonality or abnormal phenotype. 4% lambda light chain restricted plasma cells. FISH positive CCND1/IGH 12% of cells.       11/09/2016 PET scan    No FDG avid myeloma is identified. Numerous osseous lytic lesions as above. 2.Hypermetabolic left level III lymph node, likely reactive etiology.  3.Ancillary CT findings as above.      11/29/2016 -  Chemotherapy    He received treatment with daratumumab and Pomalyst       12/13/2016 - 12/16/2016 Hospital Admission    He was  admitted to the hospital for management of dehydration and early sepsis      01/23/2017 Procedure    Status post CT-guided bone marrow biopsy, with tissue specimen sent to pathology for complete histopathologic analysis      01/23/2017 Bone Marrow Biopsy    Bone Marrow, Aspirate,Biopsy, and Clot - SLIGHTLY HYPOCELLULAR BONE MARROW FOR AGE WITH TRILINEAGE HEMATOPOIESIS AND 1% PLASMA CELLS. - SEE COMMENT. PERIPHERAL BLOOD: - NORMOCYTIC-NORMOCHROMIC ANEMIA. - LEUKOPENIA. Diagnosis Note The bone marrow is slightly hypocellular for age with trilineage hematopoiesis and non specific myeloid changes likely related to previous treatment. The plasma cells represent 1% of all cells in the aspirate with lack of large aggregate or sheets. To further evaluate the plasma cell component, immunohistochemical stains will be performed and the results reported in an addendum. Correlation with cytogenetic and FISH studies recommended.      01/31/2017 Adverse Reaction    Treatment was delayed due to SOB      01/31/2017 Imaging    1. No evidence of pulmonary embolism. Sensitivity is moderately degraded by respiratory motion. 2. Multiple myeloma.  Stable compression fracture at T9. 3. Coronary artery calcifications       INTERVAL HISTORY: Please see below for problem oriented charting. He is seen in the infusion room He denies recent infection Peripheral neuropathy is stable with gabapentin and tramadol He had recent dental clearance He went back to [facility] recently for evaluation He has no new concerns  REVIEW OF SYSTEMS:   Constitutional: Denies fevers, chills or abnormal weight loss Eyes: Denies blurriness of vision Ears, nose, mouth, throat, and face: Denies mucositis or sore throat Respiratory: Denies cough, dyspnea or wheezes Cardiovascular: Denies palpitation, chest discomfort or lower extremity swelling Gastrointestinal:  Denies nausea, heartburn or change in bowel habits Skin: Denies  abnormal skin rashes Lymphatics: Denies new lymphadenopathy or easy bruising Neurological:Denies numbness, tingling or new weaknesses Behavioral/Psych: Mood is stable, no new changes  All other systems were reviewed with the patient and are negative.  I have reviewed the past medical history, past surgical history, social history and family history with the patient and they are unchanged from previous note.  ALLERGIES:  is allergic to benadryl [diphenhydramine]; trazodone and nefazodone; and latex.  MEDICATIONS:  Current Outpatient Prescriptions  Medication Sig Dispense Refill  . acetaminophen (TYLENOL) 500 MG tablet Take 1,000 mg by mouth every 6 (six) hours as needed for fever.    Marland Kitchen acyclovir (ZOVIRAX) 800 MG tablet Take 800 mg by mouth 2 (two) times daily.    Marland Kitchen aluminum-magnesium hydroxide-simethicone (MAALOX) 664-403-47 MG/5ML SUSP Take 30 mLs by mouth every 4 (four) hours as needed.    Marland Kitchen aspirin 81 MG chewable tablet Chew 81 mg by mouth daily.    Marland Kitchen CALCIUM PO Take 1 tablet by mouth daily.    . cholecalciferol (VITAMIN D) 1000 units tablet Take 1,000 Units  by mouth daily.     . folic acid (FOLVITE) 1 MG tablet Take 1 tablet (1 mg total) by mouth daily. 90 tablet 1  . gabapentin (NEURONTIN) 300 MG capsule Take 1-2 capsules (300-600 mg total) by mouth 2 (two) times daily. Take 300 mg in the morning and 600 mg at night. 100 capsule 9  . hydrochlorothiazide (HYDRODIURIL) 25 MG tablet Take 1 tablet (25 mg total) by mouth daily. 30 tablet 9  . lidocaine-prilocaine (EMLA) cream Apply to affected area once 30 g 3  . lisinopril (PRINIVIL,ZESTRIL) 20 MG tablet Take 1 tablet (20 mg total) by mouth daily. 30 tablet 6  . loperamide (IMODIUM) 2 MG capsule Take 2 mg by mouth 4 (four) times daily as needed.    . metFORMIN (GLUCOPHAGE) 500 MG tablet Take 1 tablet (500 mg total) by mouth daily with breakfast. 30 tablet 5  . Multiple Vitamin (MULTIVITAMIN) tablet Take 1 tablet by mouth daily.  1  .  ondansetron (ZOFRAN) 8 MG tablet Take 8 mg by mouth every 8 (eight) hours as needed.    . pantoprazole (PROTONIX) 40 MG tablet Take 1 tablet (40 mg total) by mouth daily. 30 tablet 6  . pomalidomide (POMALYST) 2 MG capsule Take 1 capsule (2 mg total) by mouth daily. Take with water on days 1-21. Repeat every 28 days. 21 capsule 0  . prochlorperazine (COMPAZINE) 10 MG tablet Take 10 mg by mouth every 6 (six) hours as needed.    . ranitidine (ZANTAC) 150 MG tablet TAKE 1 TABLET BY MOUTH AT BEDTIME 30 tablet 1  . traMADol (ULTRAM) 50 MG tablet Take 1-2 tablets (50-100 mg total) by mouth every 6 (six) hours as needed. for pain 90 tablet 0  . zolpidem (AMBIEN) 10 MG tablet Take 1 tablet (10 mg total) by mouth at bedtime as needed for sleep. 30 tablet 3   No current facility-administered medications for this visit.    Facility-Administered Medications Ordered in Other Visits  Medication Dose Route Frequency Provider Last Rate Last Dose  . sodium chloride flush (NS) 0.9 % injection 10 mL  10 mL Intracatheter PRN Heath Lark, MD   10 mL at 03/21/17 0924  . sodium chloride flush (NS) 0.9 % injection 10 mL  10 mL Intracatheter PRN Alvy Bimler, Yunis Voorheis, MD   10 mL at 04/25/17 1257    PHYSICAL EXAMINATION: ECOG PERFORMANCE STATUS: 1 - Symptomatic but completely ambulatory  GENERAL:alert, no distress and comfortable SKIN: skin color, texture, turgor are normal, no rashes or significant lesions EYES: normal, Conjunctiva are pink and non-injected, sclera clear OROPHARYNX:no exudate, no erythema and lips, buccal mucosa, and tongue normal  NECK: supple, thyroid normal size, non-tender, without nodularity LYMPH:  no palpable lymphadenopathy in the cervical, axillary or inguinal LUNGS: clear to auscultation and percussion with normal breathing effort HEART: regular rate & rhythm and no murmurs and no lower extremity edema ABDOMEN:abdomen soft, non-tender and normal bowel sounds Musculoskeletal:no cyanosis of digits  and no clubbing  NEURO: alert & oriented x 3 with fluent speech, no focal motor/sensory deficits  LABORATORY DATA:  I have reviewed the data as listed    Component Value Date/Time   NA 140 04/25/2017 0915   K 3.8 04/25/2017 0915   CL 102 12/16/2016 0418   CO2 24 04/25/2017 0915   GLUCOSE 121 04/25/2017 0915   BUN 13.1 04/25/2017 0915   CREATININE 1.1 04/25/2017 0915   CALCIUM 9.7 04/25/2017 0915   PROT 6.6 04/25/2017 0915   ALBUMIN 3.3 (  L) 04/25/2017 0915   AST 14 04/25/2017 0915   ALT 20 04/25/2017 0915   ALKPHOS 76 04/25/2017 0915   BILITOT 0.35 04/25/2017 0915   GFRNONAA >60 12/16/2016 0418   GFRNONAA 71 07/24/2015 1155   GFRAA >60 12/16/2016 0418   GFRAA 82 07/24/2015 1155    No results found for: SPEP, UPEP  Lab Results  Component Value Date   WBC 3.2 (L) 04/25/2017   NEUTROABS 1.0 (L) 04/25/2017   HGB 12.9 (L) 04/25/2017   HCT 38.9 04/25/2017   MCV 95.8 04/25/2017   PLT 257 04/25/2017      Chemistry      Component Value Date/Time   NA 140 04/25/2017 0915   K 3.8 04/25/2017 0915   CL 102 12/16/2016 0418   CO2 24 04/25/2017 0915   BUN 13.1 04/25/2017 0915   CREATININE 1.1 04/25/2017 0915      Component Value Date/Time   CALCIUM 9.7 04/25/2017 0915   ALKPHOS 76 04/25/2017 0915   AST 14 04/25/2017 0915   ALT 20 04/25/2017 0915   BILITOT 0.35 04/25/2017 0915       ASSESSMENT & PLAN:  Multiple myeloma in relapse ([place]) Recent myeloma panel and bone marrow biopsy confirmed that the patient has achieved near complete response to treatment He has recently completed dexamethasone taper He is reminded to take calcium, vitamin D and will receive Zometa every 3 months He will continue antimicrobial treatment with acyclovir and aspirin for DVT prophylaxis  Antineoplastic chemotherapy induced pancytopenia ([place]) This is likely due to recent treatment. The patient denies recent history of bleeding such as epistaxis, hematuria or hematochezia. He is asymptomatic  from the anemia and lekopenia. I will observe for now.    S/P autologous bone marrow transplantation ([place]) Recent bone marrow biopsy showed complete remission He has received recent vaccination at [facility] He will continue antimicrobial treatment as prescribed  Cancer associated pain His prior peripheral neuropathy and chronic pain has drastically improved since bone marrow transplant He is currently taking tramadol as needed. We'll continue the same He is on calcium and vitamin D supplement   No orders of the defined types were placed in this encounter.  All questions were answered. The patient knows to call the clinic with any problems, questions or concerns. No barriers to learning was detected. I spent 15 minutes counseling the patient face to face. The total time spent in the appointment was 20 minutes and more than 50% was on counseling and review of test results     Heath Lark, MD 04/25/2017 2:05 PM

## 2017-04-25 NOTE — Assessment & Plan Note (Signed)
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 We'll continue the same He is on calcium and vitamin D supplement

## 2017-04-25 NOTE — Assessment & Plan Note (Signed)
Recent myeloma panel and bone marrow biopsy confirmed that the patient has achieved near complete response to treatment He has recently completed dexamethasone taper He is reminded to take calcium, vitamin D and will receive Zometa every 3 months He will continue antimicrobial treatment with acyclovir and aspirin for DVT prophylaxis 

## 2017-05-02 ENCOUNTER — Other Ambulatory Visit: Payer: Medicaid Other

## 2017-05-02 ENCOUNTER — Ambulatory Visit: Payer: Medicaid Other

## 2017-05-09 ENCOUNTER — Ambulatory Visit: Payer: Medicaid Other

## 2017-05-09 ENCOUNTER — Other Ambulatory Visit (HOSPITAL_BASED_OUTPATIENT_CLINIC_OR_DEPARTMENT_OTHER): Payer: Medicaid Other

## 2017-05-09 ENCOUNTER — Ambulatory Visit (HOSPITAL_BASED_OUTPATIENT_CLINIC_OR_DEPARTMENT_OTHER): Payer: Medicaid Other

## 2017-05-09 ENCOUNTER — Other Ambulatory Visit: Payer: Medicaid Other

## 2017-05-09 VITALS — BP 139/100 | HR 89 | Temp 97.7°F | Resp 16

## 2017-05-09 DIAGNOSIS — Z5112 Encounter for antineoplastic immunotherapy: Secondary | ICD-10-CM

## 2017-05-09 DIAGNOSIS — C9002 Multiple myeloma in relapse: Secondary | ICD-10-CM

## 2017-05-09 DIAGNOSIS — C9001 Multiple myeloma in remission: Secondary | ICD-10-CM

## 2017-05-09 LAB — COMPREHENSIVE METABOLIC PANEL
ALT: 13 U/L (ref 0–55)
ANION GAP: 6 meq/L (ref 3–11)
AST: 14 U/L (ref 5–34)
Albumin: 3.5 g/dL (ref 3.5–5.0)
Alkaline Phosphatase: 61 U/L (ref 40–150)
BUN: 11.2 mg/dL (ref 7.0–26.0)
CHLORIDE: 109 meq/L (ref 98–109)
CO2: 26 meq/L (ref 22–29)
CREATININE: 0.9 mg/dL (ref 0.7–1.3)
Calcium: 9.4 mg/dL (ref 8.4–10.4)
Glucose: 97 mg/dl (ref 70–140)
POTASSIUM: 3.9 meq/L (ref 3.5–5.1)
Sodium: 141 mEq/L (ref 136–145)
Total Bilirubin: 0.46 mg/dL (ref 0.20–1.20)
Total Protein: 6.5 g/dL (ref 6.4–8.3)

## 2017-05-09 LAB — CBC WITH DIFFERENTIAL/PLATELET
BASO%: 4.1 % — AB (ref 0.0–2.0)
BASOS ABS: 0.2 10*3/uL — AB (ref 0.0–0.1)
EOS%: 14 % — ABNORMAL HIGH (ref 0.0–7.0)
Eosinophils Absolute: 0.7 10*3/uL — ABNORMAL HIGH (ref 0.0–0.5)
HCT: 38.8 % (ref 38.4–49.9)
HGB: 13.1 g/dL (ref 13.0–17.1)
LYMPH%: 25.6 % (ref 14.0–49.0)
MCH: 32.4 pg (ref 27.2–33.4)
MCHC: 33.9 g/dL (ref 32.0–36.0)
MCV: 95.7 fL (ref 79.3–98.0)
MONO#: 0.3 10*3/uL (ref 0.1–0.9)
MONO%: 6.7 % (ref 0.0–14.0)
NEUT#: 2.4 10*3/uL (ref 1.5–6.5)
NEUT%: 49.6 % (ref 39.0–75.0)
PLATELETS: 236 10*3/uL (ref 140–400)
RBC: 4.05 10*6/uL — AB (ref 4.20–5.82)
RDW: 15.1 % — ABNORMAL HIGH (ref 11.0–14.6)
WBC: 4.8 10*3/uL (ref 4.0–10.3)
lymph#: 1.2 10*3/uL (ref 0.9–3.3)

## 2017-05-09 MED ORDER — DEXAMETHASONE SODIUM PHOSPHATE 10 MG/ML IJ SOLN
INTRAMUSCULAR | Status: AC
  Filled 2017-05-09: qty 1

## 2017-05-09 MED ORDER — DEXAMETHASONE SODIUM PHOSPHATE 10 MG/ML IJ SOLN
10.0000 mg | Freq: Once | INTRAMUSCULAR | Status: AC
  Administered 2017-05-09: 10 mg via INTRAVENOUS

## 2017-05-09 MED ORDER — MONTELUKAST SODIUM 10 MG PO TABS
10.0000 mg | ORAL_TABLET | Freq: Once | ORAL | Status: AC
  Administered 2017-05-09: 10 mg via ORAL

## 2017-05-09 MED ORDER — FAMOTIDINE IN NACL 20-0.9 MG/50ML-% IV SOLN
INTRAVENOUS | Status: AC
  Filled 2017-05-09: qty 50

## 2017-05-09 MED ORDER — PROCHLORPERAZINE MALEATE 10 MG PO TABS
10.0000 mg | ORAL_TABLET | Freq: Once | ORAL | Status: AC
  Administered 2017-05-09: 10 mg via ORAL

## 2017-05-09 MED ORDER — HEPARIN SOD (PORK) LOCK FLUSH 100 UNIT/ML IV SOLN
500.0000 [IU] | Freq: Once | INTRAVENOUS | Status: AC | PRN
  Administered 2017-05-09: 500 [IU]
  Filled 2017-05-09: qty 5

## 2017-05-09 MED ORDER — ACETAMINOPHEN 325 MG PO TABS
ORAL_TABLET | ORAL | Status: AC
  Filled 2017-05-09: qty 2

## 2017-05-09 MED ORDER — PROCHLORPERAZINE MALEATE 10 MG PO TABS
ORAL_TABLET | ORAL | Status: AC
  Filled 2017-05-09: qty 1

## 2017-05-09 MED ORDER — SODIUM CHLORIDE 0.9 % IV SOLN
Freq: Once | INTRAVENOUS | Status: AC
  Administered 2017-05-09: 13:00:00 via INTRAVENOUS

## 2017-05-09 MED ORDER — SODIUM CHLORIDE 0.9% FLUSH
10.0000 mL | Freq: Once | INTRAVENOUS | Status: AC
  Administered 2017-05-09: 10 mL
  Filled 2017-05-09: qty 10

## 2017-05-09 MED ORDER — ACETAMINOPHEN 325 MG PO TABS
650.0000 mg | ORAL_TABLET | Freq: Once | ORAL | Status: AC
  Administered 2017-05-09: 650 mg via ORAL

## 2017-05-09 MED ORDER — SODIUM CHLORIDE 0.9 % IV SOLN
15.8000 mg/kg | Freq: Once | INTRAVENOUS | Status: AC
  Administered 2017-05-09: 1200 mg via INTRAVENOUS
  Filled 2017-05-09: qty 60

## 2017-05-09 MED ORDER — MONTELUKAST SODIUM 10 MG PO TABS
ORAL_TABLET | ORAL | Status: AC
  Filled 2017-05-09: qty 1

## 2017-05-09 MED ORDER — FAMOTIDINE IN NACL 20-0.9 MG/50ML-% IV SOLN
20.0000 mg | Freq: Once | INTRAVENOUS | Status: AC
  Administered 2017-05-09: 20 mg via INTRAVENOUS

## 2017-05-09 MED ORDER — SODIUM CHLORIDE 0.9% FLUSH
10.0000 mL | INTRAVENOUS | Status: DC | PRN
  Administered 2017-05-09: 10 mL
  Filled 2017-05-09: qty 10

## 2017-05-09 NOTE — Patient Instructions (Signed)
[facility] Discharge Instructions for Patients Receiving Chemotherapy  Today you received the following chemotherapy agents darzalex  To help prevent nausea and vomiting after your treatment, we encourage you to take your nausea medication as directed  If you develop nausea and vomiting that is not controlled by your nausea medication, call the clinic.   BELOW ARE SYMPTOMS THAT SHOULD BE REPORTED IMMEDIATELY:  *FEVER GREATER THAN 100.5 F  *CHILLS WITH OR WITHOUT FEVER  NAUSEA AND VOMITING THAT IS NOT CONTROLLED WITH YOUR NAUSEA MEDICATION  *UNUSUAL SHORTNESS OF BREATH  *UNUSUAL BRUISING OR BLEEDING  TENDERNESS IN MOUTH AND THROAT WITH OR WITHOUT PRESENCE OF ULCERS  *URINARY PROBLEMS  *BOWEL PROBLEMS  UNUSUAL RASH Items with * indicate a potential emergency and should be followed up as soon as possible.  Feel free to call the clinic you have any questions or concerns. The clinic phone number is [phone].  

## 2017-05-09 NOTE — Progress Notes (Signed)
Ok to proceed with treatment today with blood pressure of 154/100 per Dr. Alvy Bimler.

## 2017-05-10 LAB — KAPPA/LAMBDA LIGHT CHAINS
IG KAPPA FREE LIGHT CHAIN: 7.3 mg/L (ref 3.3–19.4)
Ig Lambda Free Light Chain: 9.4 mg/L (ref 5.7–26.3)
Kappa/Lambda FluidC Ratio: 0.78 (ref 0.26–1.65)

## 2017-05-11 LAB — PROTEIN ELECTROPHORESIS, SERUM
A/G Ratio: 1.3 (ref 0.7–1.7)
ALBUMIN: 3.3 g/dL (ref 2.9–4.4)
ALPHA 2: 1 g/dL (ref 0.4–1.0)
Alpha 1: 0.2 g/dL (ref 0.0–0.4)
BETA: 0.9 g/dL (ref 0.7–1.3)
GLOBULIN, TOTAL: 2.6 g/dL (ref 2.2–3.9)
Gamma Globulin: 0.5 g/dL (ref 0.4–1.8)
M-Spike, %: 0.1 g/dL — ABNORMAL HIGH
Total Protein: 5.9 g/dL — ABNORMAL LOW (ref 6.0–8.5)

## 2017-05-18 ENCOUNTER — Telehealth: Payer: Self-pay

## 2017-05-18 MED ORDER — ACYCLOVIR 400 MG PO TABS: 400.0000 mg | ORAL_TABLET | Freq: Two times a day (BID) | ORAL | 6 refills | Status: DC

## 2017-05-18 NOTE — Telephone Encounter (Signed)
lvm that rx was sent

## 2017-05-18 NOTE — Telephone Encounter (Addendum)
Pt is calling for acyclovir refill. MAR states 800mg  BID, historically it was 400 mg BID. Please clarify.  Pt wants rx to be sent to [facility] in [place].  Please call pt at [phone] when rx sent b/c he is having phone issues with other number.

## 2017-05-18 NOTE — Telephone Encounter (Signed)
OK to reduce to 400 mg BID PO, dispense 60, 6 refills to local pharmacy

## 2017-05-18 NOTE — Addendum Note (Signed)
Addended by: Janace Hoard on: 05/18/2017 03:09 PM   Modules accepted: Orders

## 2017-05-19 ENCOUNTER — Telehealth: Payer: Self-pay

## 2017-05-19 NOTE — Telephone Encounter (Signed)
Pt called about his acyclovir rx. Called back and lvm again that acyclovir is sent to pharmacy.

## 2017-05-19 NOTE — Telephone Encounter (Signed)
Pt said he picked up acyclovir this morning

## 2017-05-22 ENCOUNTER — Other Ambulatory Visit: Payer: Self-pay | Admitting: Hematology and Oncology

## 2017-05-22 DIAGNOSIS — C9002 Multiple myeloma in relapse: Secondary | ICD-10-CM

## 2017-05-23 ENCOUNTER — Encounter: Payer: Self-pay | Admitting: Hematology and Oncology

## 2017-05-23 ENCOUNTER — Ambulatory Visit: Payer: Medicaid Other

## 2017-05-23 ENCOUNTER — Other Ambulatory Visit (HOSPITAL_BASED_OUTPATIENT_CLINIC_OR_DEPARTMENT_OTHER): Payer: Medicaid Other

## 2017-05-23 ENCOUNTER — Ambulatory Visit (HOSPITAL_BASED_OUTPATIENT_CLINIC_OR_DEPARTMENT_OTHER): Payer: Medicaid Other

## 2017-05-23 ENCOUNTER — Telehealth: Payer: Self-pay

## 2017-05-23 ENCOUNTER — Ambulatory Visit (HOSPITAL_BASED_OUTPATIENT_CLINIC_OR_DEPARTMENT_OTHER): Payer: Medicaid Other | Admitting: Hematology and Oncology

## 2017-05-23 VITALS — BP 146/86 | HR 84 | Temp 97.9°F | Resp 18

## 2017-05-23 DIAGNOSIS — Z9481 Bone marrow transplant status: Secondary | ICD-10-CM

## 2017-05-23 DIAGNOSIS — D61810 Antineoplastic chemotherapy induced pancytopenia: Secondary | ICD-10-CM

## 2017-05-23 DIAGNOSIS — C9002 Multiple myeloma in relapse: Secondary | ICD-10-CM

## 2017-05-23 DIAGNOSIS — Z5112 Encounter for antineoplastic immunotherapy: Secondary | ICD-10-CM

## 2017-05-23 DIAGNOSIS — T451X5A Adverse effect of antineoplastic and immunosuppressive drugs, initial encounter: Secondary | ICD-10-CM

## 2017-05-23 DIAGNOSIS — C9001 Multiple myeloma in remission: Secondary | ICD-10-CM

## 2017-05-23 LAB — CBC WITH DIFFERENTIAL/PLATELET
BASO%: 1.7 % (ref 0.0–2.0)
Basophils Absolute: 0.1 10*3/uL (ref 0.0–0.1)
EOS%: 17 % — AB (ref 0.0–7.0)
Eosinophils Absolute: 0.5 10*3/uL (ref 0.0–0.5)
HEMATOCRIT: 37.2 % — AB (ref 38.4–49.9)
HEMOGLOBIN: 12.5 g/dL — AB (ref 13.0–17.1)
LYMPH#: 1 10*3/uL (ref 0.9–3.3)
LYMPH%: 30.1 % (ref 14.0–49.0)
MCH: 31.9 pg (ref 27.2–33.4)
MCHC: 33.5 g/dL (ref 32.0–36.0)
MCV: 95.2 fL (ref 79.3–98.0)
MONO#: 0.5 10*3/uL (ref 0.1–0.9)
MONO%: 14.9 % — ABNORMAL HIGH (ref 0.0–14.0)
NEUT#: 1.2 10*3/uL — ABNORMAL LOW (ref 1.5–6.5)
NEUT%: 36.3 % — ABNORMAL LOW (ref 39.0–75.0)
Platelets: 183 10*3/uL (ref 140–400)
RBC: 3.91 10*6/uL — ABNORMAL LOW (ref 4.20–5.82)
RDW: 15.5 % — AB (ref 11.0–14.6)
WBC: 3.2 10*3/uL — ABNORMAL LOW (ref 4.0–10.3)

## 2017-05-23 LAB — COMPREHENSIVE METABOLIC PANEL
ALK PHOS: 54 U/L (ref 40–150)
ALT: 14 U/L (ref 0–55)
AST: 14 U/L (ref 5–34)
Albumin: 3.4 g/dL — ABNORMAL LOW (ref 3.5–5.0)
Anion Gap: 7 mEq/L (ref 3–11)
BUN: 13.2 mg/dL (ref 7.0–26.0)
CALCIUM: 9 mg/dL (ref 8.4–10.4)
CO2: 24 mEq/L (ref 22–29)
CREATININE: 1 mg/dL (ref 0.7–1.3)
Chloride: 112 mEq/L — ABNORMAL HIGH (ref 98–109)
EGFR: >90 mL/min/{1.73_m2} (ref >90)
Glucose: 96 mg/dl (ref 70–140)
Potassium: 3.7 mEq/L (ref 3.5–5.1)
Sodium: 143 mEq/L (ref 136–145)
Total Bilirubin: 0.38 mg/dL (ref 0.20–1.20)
Total Protein: 6.1 g/dL — ABNORMAL LOW (ref 6.4–8.3)

## 2017-05-23 MED ORDER — PROCHLORPERAZINE MALEATE 10 MG PO TABS
10.0000 mg | ORAL_TABLET | Freq: Once | ORAL | Status: AC
  Administered 2017-05-23: 10 mg via ORAL

## 2017-05-23 MED ORDER — FAMOTIDINE IN NACL 20-0.9 MG/50ML-% IV SOLN
20.0000 mg | Freq: Once | INTRAVENOUS | Status: AC
  Administered 2017-05-23: 20 mg via INTRAVENOUS

## 2017-05-23 MED ORDER — MONTELUKAST SODIUM 10 MG PO TABS
ORAL_TABLET | ORAL | Status: AC
  Filled 2017-05-23: qty 1

## 2017-05-23 MED ORDER — ACETAMINOPHEN 325 MG PO TABS
ORAL_TABLET | ORAL | Status: AC
  Filled 2017-05-23: qty 2

## 2017-05-23 MED ORDER — MONTELUKAST SODIUM 10 MG PO TABS
10.0000 mg | ORAL_TABLET | Freq: Once | ORAL | Status: AC
  Administered 2017-05-23: 10 mg via ORAL

## 2017-05-23 MED ORDER — FAMOTIDINE IN NACL 20-0.9 MG/50ML-% IV SOLN
INTRAVENOUS | Status: AC
  Filled 2017-05-23: qty 50

## 2017-05-23 MED ORDER — SODIUM CHLORIDE 0.9% FLUSH
10.0000 mL | INTRAVENOUS | Status: DC | PRN
  Administered 2017-05-23: 10 mL
  Filled 2017-05-23: qty 10

## 2017-05-23 MED ORDER — ACETAMINOPHEN 325 MG PO TABS
650.0000 mg | ORAL_TABLET | Freq: Once | ORAL | Status: AC
  Administered 2017-05-23: 650 mg via ORAL

## 2017-05-23 MED ORDER — DEXAMETHASONE SODIUM PHOSPHATE 10 MG/ML IJ SOLN
10.0000 mg | Freq: Once | INTRAMUSCULAR | Status: AC
  Administered 2017-05-23: 10 mg via INTRAVENOUS

## 2017-05-23 MED ORDER — SODIUM CHLORIDE 0.9 % IV SOLN
15.7500 mg/kg | Freq: Once | INTRAVENOUS | Status: AC
  Administered 2017-05-23: 1200 mg via INTRAVENOUS
  Filled 2017-05-23: qty 60

## 2017-05-23 MED ORDER — DEXAMETHASONE SODIUM PHOSPHATE 10 MG/ML IJ SOLN
INTRAMUSCULAR | Status: AC
  Filled 2017-05-23: qty 1

## 2017-05-23 MED ORDER — HEPARIN SOD (PORK) LOCK FLUSH 100 UNIT/ML IV SOLN
500.0000 [IU] | Freq: Once | INTRAVENOUS | Status: AC | PRN
  Administered 2017-05-23: 500 [IU]
  Filled 2017-05-23: qty 5

## 2017-05-23 MED ORDER — SODIUM CHLORIDE 0.9 % IV SOLN
Freq: Once | INTRAVENOUS | Status: AC
  Administered 2017-05-23: 13:00:00 via INTRAVENOUS

## 2017-05-23 MED ORDER — PROCHLORPERAZINE MALEATE 10 MG PO TABS
ORAL_TABLET | ORAL | Status: AC
  Filled 2017-05-23: qty 1

## 2017-05-23 NOTE — Assessment & Plan Note (Signed)
Recent bone marrow biopsy showed complete remission He has received recent vaccination at [facility] He will continue antimicrobial treatment as prescribed

## 2017-05-23 NOTE — Assessment & Plan Note (Signed)
This is likely due to recent treatment. The patient denies recent history of bleeding such as epistaxis, hematuria or hematochezia. He is asymptomatic from the anemia and lekopenia. I will observe for now.

## 2017-05-23 NOTE — Progress Notes (Signed)
[place] OFFICE PROGRESS NOTE  Patient Care Team: Arnoldo Morale, MD as PCP - General (Family Medicine) Melburn Hake, Costella Hatcher, MD as Consulting Physician (Hematology and Oncology)  SUMMARY OF ONCOLOGIC HISTORY:   Multiple myeloma in relapse [facility])   08/28/2015 Bone Marrow Biopsy    Accession: [id number] biopsy showed 58% myeloma involvement. Cytogenetics was normal and FISH showed Del 13q and t(11;14).       09/07/2015 - 01/22/2016 Chemotherapy    he received 6 cycles of treatment of Velcade and Revlimid with Dex       09/12/2015 - 09/16/2015 Hospital Admission    He was admitted to the hospital with sepsis, treatment was placed on hold and he received one dose of GCSF for leukopenia      01/12/2016 Adverse Reaction    Delay treatment due to neutropenia      01/27/2016 Imaging    ECHO at [facility] showed normal EF      01/27/2016 PET scan    PET scan at [facility] showed innumerable lytic lesions throughout      01/27/2016 Procedure    PFT is within normal limits at [facility]      01/28/2016 Bone Marrow Biopsy    Bone marrow at [facility] showed persistent plasma cell myeloma in a normocellularmarrow (30%) with 20% atypical plasma cells and myeloid hyperplasia.      02/11/2016 - 06/10/2016 Chemotherapy    He is started on salvage Rx with Kyprolis, Cytoxan and dexamethasone      03/24/2016 Imaging    US venous Doppler showed no evidence of deep vein or superficial thrombosis involving the right lower extremity and left lower extremity. No evidence of Baker&'s cyst on the right or left.      05/18/2016 - 05/19/2016 Hospital Admission    He was admitted for chest pain evaluation. Cardiac work-up excluded cardiac events and CT angiogram were negative for PE. It showed esphageal thickening c/w reflux esophagitis likely cause for his chest discomfort      05/23/2016 - 05/25/2016 Hospital Admission    Patient was hospitalized for worsening shortness of breath and  worsening pain. CT angiogram chest negative for pulmonary emboli. Patient had a recent normal 2-D echo and ruled out for MI. Patient noted to be dehydrated. Patient admitted placed on IV fluids. Diuretic discontinued. Patient's gabapentin has been increased per recommendations from prior office visit from patient's oncologist to 600 mg 3 times daily      06/15/2016 Bone Marrow Biopsy    Bone marrow biopsy showed only 2% plasma cell, normal FISH and cytogenetics      07/26/2016 - 07/26/2016 Chemotherapy    He received high dose melphalan at [facility]       07/27/2016 Bone Marrow Transplant    He received autologous stem cell transplant at [facility]      08/05/2016 - 08/10/2016 Hospital Admission    He was admitted to [facility] for management of neutropenic fever and diarrhea. Cultures were negative. Stool culture came back positive for Novovirus and he was managed conservatively.      11/03/2016 Bone Marrow Biopsy    Bone Marrow Flow Cytometry - NO MONOCLONAL B CELL POPULATION IDENTIFIED. - PREDOMINANCE OF T CELLS WITH NON SPECIFIC CHANGES. - SEE NOTE. Diagnosis Comment: Analysis of the lymphoid population shows predominance of T lymphocytes expressing pan T-cell antigens but with relative abundance of CD8 positive cells and reversal of the CD4:CD8 ratio. No significant CD16/56 expression is identified. The T-cell changes  are not considered specific in this setting. B cells represent the minor population with no monoclonality or abnormal phenotype. 4% lambda light chain restricted plasma cells. FISH positive CCND1/IGH 12% of cells.       11/09/2016 PET scan    No FDG avid myeloma is identified. Numerous osseous lytic lesions as above. 2.Hypermetabolic left level III lymph node, likely reactive etiology.  3.Ancillary CT findings as above.      11/29/2016 -  Chemotherapy    He received treatment with daratumumab and Pomalyst       12/13/2016 - 12/16/2016 Hospital Admission    He was  admitted to the hospital for management of dehydration and early sepsis      01/23/2017 Procedure    Status post CT-guided bone marrow biopsy, with tissue specimen sent to pathology for complete histopathologic analysis      01/23/2017 Bone Marrow Biopsy    Bone Marrow, Aspirate,Biopsy, and Clot - SLIGHTLY HYPOCELLULAR BONE MARROW FOR AGE WITH TRILINEAGE HEMATOPOIESIS AND 1% PLASMA CELLS. - SEE COMMENT. PERIPHERAL BLOOD: - NORMOCYTIC-NORMOCHROMIC ANEMIA. - LEUKOPENIA. Diagnosis Note The bone marrow is slightly hypocellular for age with trilineage hematopoiesis and non specific myeloid changes likely related to previous treatment. The plasma cells represent 1% of all cells in the aspirate with lack of large aggregate or sheets. To further evaluate the plasma cell component, immunohistochemical stains will be performed and the results reported in an addendum. Correlation with cytogenetic and FISH studies recommended.      01/31/2017 Adverse Reaction    Treatment was delayed due to SOB      01/31/2017 Imaging    1. No evidence of pulmonary embolism. Sensitivity is moderately degraded by respiratory motion. 2. Multiple myeloma.  Stable compression fracture at T9. 3. Coronary artery calcifications       INTERVAL HISTORY: Please see below for problem oriented charting. He returns for further follow-up He feels well No recent bone pain No recent infection The patient denies any recent signs or symptoms of bleeding such as spontaneous epistaxis, hematuria or hematochezia.   REVIEW OF SYSTEMS:   Constitutional: Denies fevers, chills or abnormal weight loss Eyes: Denies blurriness of vision Ears, nose, mouth, throat, and face: Denies mucositis or sore throat Respiratory: Denies cough, dyspnea or wheezes Cardiovascular: Denies palpitation, chest discomfort or lower extremity swelling Gastrointestinal:  Denies nausea, heartburn or change in bowel habits Skin: Denies abnormal skin  rashes Lymphatics: Denies new lymphadenopathy or easy bruising Neurological:Denies numbness, tingling or new weaknesses Behavioral/Psych: Mood is stable, no new changes  All other systems were reviewed with the patient and are negative.  I have reviewed the past medical history, past surgical history, social history and family history with the patient and they are unchanged from previous note.  ALLERGIES:  is allergic to benadryl [diphenhydramine]; trazodone and nefazodone; and latex.  MEDICATIONS:  Current Outpatient Prescriptions  Medication Sig Dispense Refill  . acetaminophen (TYLENOL) 500 MG tablet Take 1,000 mg by mouth every 6 (six) hours as needed for fever.    Marland Kitchen acyclovir (ZOVIRAX) 400 MG tablet Take 1 tablet (400 mg total) by mouth 2 (two) times daily. 60 tablet 6  . aluminum-magnesium hydroxide-simethicone (MAALOX) 903-009-23 MG/5ML SUSP Take 30 mLs by mouth every 4 (four) hours as needed.    Marland Kitchen aspirin 81 MG chewable tablet Chew 81 mg by mouth daily.    Marland Kitchen CALCIUM PO Take 1 tablet by mouth daily.    . cholecalciferol (VITAMIN D) 1000 units tablet Take 1,000 Units by  mouth daily.     . folic acid (FOLVITE) 1 MG tablet Take 1 tablet (1 mg total) by mouth daily. 90 tablet 1  . gabapentin (NEURONTIN) 300 MG capsule Take 1-2 capsules (300-600 mg total) by mouth 2 (two) times daily. Take 300 mg in the morning and 600 mg at night. 100 capsule 9  . hydrochlorothiazide (HYDRODIURIL) 25 MG tablet Take 1 tablet (25 mg total) by mouth daily. 30 tablet 9  . lidocaine-prilocaine (EMLA) cream Apply to affected area once 30 g 3  . lisinopril (PRINIVIL,ZESTRIL) 20 MG tablet Take 1 tablet (20 mg total) by mouth daily. 30 tablet 6  . loperamide (IMODIUM) 2 MG capsule Take 2 mg by mouth 4 (four) times daily as needed.    . metFORMIN (GLUCOPHAGE) 500 MG tablet Take 1 tablet (500 mg total) by mouth daily with breakfast. 30 tablet 5  . Multiple Vitamin (MULTIVITAMIN) tablet Take 1 tablet by mouth daily.   1  . ondansetron (ZOFRAN) 8 MG tablet Take 8 mg by mouth every 8 (eight) hours as needed.    . pantoprazole (PROTONIX) 40 MG tablet Take 1 tablet (40 mg total) by mouth daily. 30 tablet 6  . pomalidomide (POMALYST) 2 MG capsule Take 1 capsule (2 mg total) by mouth daily. Take with water on days 1-21. Repeat every 28 days. 21 capsule 0  . prochlorperazine (COMPAZINE) 10 MG tablet Take 10 mg by mouth every 6 (six) hours as needed.    . ranitidine (ZANTAC) 150 MG tablet TAKE 1 TABLET BY MOUTH AT BEDTIME 30 tablet 1  . traMADol (ULTRAM) 50 MG tablet Take 1-2 tablets (50-100 mg total) by mouth every 6 (six) hours as needed. for pain 90 tablet 0  . zolpidem (AMBIEN) 10 MG tablet Take 1 tablet (10 mg total) by mouth at bedtime as needed for sleep. 30 tablet 3   No current facility-administered medications for this visit.    Facility-Administered Medications Ordered in Other Visits  Medication Dose Route Frequency Provider Last Rate Last Dose  . sodium chloride flush (NS) 0.9 % injection 10 mL  10 mL Intracatheter PRN Heath Lark, MD   10 mL at 03/21/17 4332    PHYSICAL EXAMINATION: ECOG PERFORMANCE STATUS: 0 - Asymptomatic  Vitals:   05/23/17 1242  BP: 133/89  Pulse: 84  Resp: 20  Temp: 98.3 F (36.8 C)  SpO2: 100%   Filed Weights   05/23/17 1242  Weight: 177 lb 4.8 oz (80.4 kg)    GENERAL:alert, no distress and comfortable SKIN: skin color, texture, turgor are normal, no rashes or significant lesions EYES: normal, Conjunctiva are pink and non-injected, sclera clear OROPHARYNX:no exudate, no erythema and lips, buccal mucosa, and tongue normal  NECK: supple, thyroid normal size, non-tender, without nodularity LYMPH:  no palpable lymphadenopathy in the cervical, axillary or inguinal LUNGS: clear to auscultation and percussion with normal breathing effort HEART: regular rate & rhythm and no murmurs and no lower extremity edema ABDOMEN:abdomen soft, non-tender and normal bowel  sounds Musculoskeletal:no cyanosis of digits and no clubbing  NEURO: alert & oriented x 3 with fluent speech, no focal motor/sensory deficits  LABORATORY DATA:  I have reviewed the data as listed    Component Value Date/Time   NA 141 05/09/2017 1114   K 3.9 05/09/2017 1114   CL 102 12/16/2016 0418   CO2 26 05/09/2017 1114   GLUCOSE 97 05/09/2017 1114   BUN 11.2 05/09/2017 1114   CREATININE 0.9 05/09/2017 1114   CALCIUM 9.4  05/09/2017 1114   PROT 5.9 (L) 05/09/2017 1114   PROT 6.5 05/09/2017 1114   ALBUMIN 3.5 05/09/2017 1114   AST 14 05/09/2017 1114   ALT 13 05/09/2017 1114   ALKPHOS 61 05/09/2017 1114   BILITOT 0.46 05/09/2017 1114   GFRNONAA >60 12/16/2016 0418   GFRNONAA 71 07/24/2015 1155   GFRAA >60 12/16/2016 0418   GFRAA 82 07/24/2015 1155    No results found for: SPEP, UPEP  Lab Results  Component Value Date   WBC 3.2 (L) 05/23/2017   NEUTROABS 1.2 (L) 05/23/2017   HGB 12.5 (L) 05/23/2017   HCT 37.2 (L) 05/23/2017   MCV 95.2 05/23/2017   PLT 183 05/23/2017      Chemistry      Component Value Date/Time   NA 141 05/09/2017 1114   K 3.9 05/09/2017 1114   CL 102 12/16/2016 0418   CO2 26 05/09/2017 1114   BUN 11.2 05/09/2017 1114   CREATININE 0.9 05/09/2017 1114      Component Value Date/Time   CALCIUM 9.4 05/09/2017 1114   ALKPHOS 61 05/09/2017 1114   AST 14 05/09/2017 1114   ALT 13 05/09/2017 1114   BILITOT 0.46 05/09/2017 1114    ASSESSMENT & PLAN:  Multiple myeloma in relapse ([place]) Recent myeloma panel and bone marrow biopsy confirmed that the patient has achieved near complete response to treatment He has recently completed dexamethasone taper He is reminded to take calcium, vitamin D and will receive Zometa every 3 months He will continue antimicrobial treatment with acyclovir and aspirin for DVT prophylaxis  Antineoplastic chemotherapy induced pancytopenia ([place]) This is likely due to recent treatment. The patient denies recent history of  bleeding such as epistaxis, hematuria or hematochezia. He is asymptomatic from the anemia and lekopenia. I will observe for now.    S/P autologous bone marrow transplantation [facility]) Recent bone marrow biopsy showed complete remission He has received recent vaccination at [facility] He will continue antimicrobial treatment as prescribed   No orders of the defined types were placed in this encounter.  All questions were answered. The patient knows to call the clinic with any problems, questions or concerns. No barriers to learning was detected. I spent 15 minutes counseling the patient face to face. The total time spent in the appointment was 20 minutes and more than 50% was on counseling and review of test results     Heath Lark, MD 05/23/2017 12:53 PM

## 2017-05-23 NOTE — Patient Instructions (Signed)
[facility] Discharge Instructions for Patients Receiving Chemotherapy  Today you received the following chemotherapy agents darzalex  To help prevent nausea and vomiting after your treatment, we encourage you to take your nausea medication as directed  If you develop nausea and vomiting that is not controlled by your nausea medication, call the clinic.   BELOW ARE SYMPTOMS THAT SHOULD BE REPORTED IMMEDIATELY:  *FEVER GREATER THAN 100.5 F  *CHILLS WITH OR WITHOUT FEVER  NAUSEA AND VOMITING THAT IS NOT CONTROLLED WITH YOUR NAUSEA MEDICATION  *UNUSUAL SHORTNESS OF BREATH  *UNUSUAL BRUISING OR BLEEDING  TENDERNESS IN MOUTH AND THROAT WITH OR WITHOUT PRESENCE OF ULCERS  *URINARY PROBLEMS  *BOWEL PROBLEMS  UNUSUAL RASH Items with * indicate a potential emergency and should be followed up as soon as possible.  Feel free to call the clinic you have any questions or concerns. The clinic phone number is [phone].  

## 2017-05-23 NOTE — Progress Notes (Signed)
Per Dr Alvy Bimler ok to tx with ANC 1.2 today and to go ahead and release orders and treat without CMT being resulted.

## 2017-05-23 NOTE — Telephone Encounter (Signed)
Called and spoke with patin concerning upcoming appointment. Per 10/9 los

## 2017-05-23 NOTE — Assessment & Plan Note (Signed)
Recent myeloma panel and bone marrow biopsy confirmed that the patient has achieved near complete response to treatment He has recently completed dexamethasone taper He is reminded to take calcium, vitamin D and will receive Zometa every 3 months He will continue antimicrobial treatment with acyclovir and aspirin for DVT prophylaxis

## 2017-05-25 LAB — MULTIPLE MYELOMA PANEL, SERUM
Albumin SerPl Elph-Mcnc: 3.3 g/dL (ref 2.9–4.4)
Albumin/Glob SerPl: 1.4 (ref 0.7–1.7)
Alpha 1: 0.2 g/dL (ref 0.0–0.4)
Alpha2 Glob SerPl Elph-Mcnc: 0.7 g/dL (ref 0.4–1.0)
B-Globulin SerPl Elph-Mcnc: 1 g/dL (ref 0.7–1.3)
GAMMA GLOB SERPL ELPH-MCNC: 0.5 g/dL (ref 0.4–1.8)
GLOBULIN, TOTAL: 2.4 g/dL (ref 2.2–3.9)
IGA/IMMUNOGLOBULIN A, SERUM: 70 mg/dL (ref 61–437)
IGM (IMMUNOGLOBIN M), SRM: 14 mg/dL — AB (ref 20–172)
IgG, Qn, Serum: 458 mg/dL — ABNORMAL LOW (ref 700–1600)
M Protein SerPl Elph-Mcnc: 0.1 g/dL — ABNORMAL HIGH
Total Protein: 5.7 g/dL — ABNORMAL LOW (ref 6.0–8.5)

## 2017-05-30 ENCOUNTER — Other Ambulatory Visit: Payer: Self-pay | Admitting: *Deleted

## 2017-05-30 MED ORDER — POMALIDOMIDE 2 MG PO CAPS: 2.0000 mg | ORAL_CAPSULE | Freq: Every day | ORAL | 0 refills | Status: DC

## 2017-06-06 ENCOUNTER — Other Ambulatory Visit (HOSPITAL_BASED_OUTPATIENT_CLINIC_OR_DEPARTMENT_OTHER): Payer: Medicaid Other

## 2017-06-06 ENCOUNTER — Ambulatory Visit (HOSPITAL_BASED_OUTPATIENT_CLINIC_OR_DEPARTMENT_OTHER): Payer: Medicaid Other

## 2017-06-06 ENCOUNTER — Ambulatory Visit: Payer: Medicaid Other

## 2017-06-06 VITALS — BP 155/98 | HR 80 | Temp 98.6°F | Resp 16

## 2017-06-06 DIAGNOSIS — C9001 Multiple myeloma in remission: Secondary | ICD-10-CM

## 2017-06-06 DIAGNOSIS — C9002 Multiple myeloma in relapse: Secondary | ICD-10-CM | POA: Diagnosis present

## 2017-06-06 DIAGNOSIS — Z5112 Encounter for antineoplastic immunotherapy: Secondary | ICD-10-CM | POA: Diagnosis not present

## 2017-06-06 LAB — CBC WITH DIFFERENTIAL/PLATELET
BASO%: 4.5 % — ABNORMAL HIGH (ref 0.0–2.0)
BASOS ABS: 0.2 10*3/uL — AB (ref 0.0–0.1)
EOS%: 8.5 % — AB (ref 0.0–7.0)
Eosinophils Absolute: 0.3 10*3/uL (ref 0.0–0.5)
HCT: 39.2 % (ref 38.4–49.9)
HEMOGLOBIN: 13.4 g/dL (ref 13.0–17.1)
LYMPH%: 31.3 % (ref 14.0–49.0)
MCH: 32.1 pg (ref 27.2–33.4)
MCHC: 34.2 g/dL (ref 32.0–36.0)
MCV: 94 fL (ref 79.3–98.0)
MONO#: 0.4 10*3/uL (ref 0.1–0.9)
MONO%: 11.8 % (ref 0.0–14.0)
NEUT#: 1.6 10*3/uL (ref 1.5–6.5)
NEUT%: 43.9 % (ref 39.0–75.0)
Platelets: 235 10*3/uL (ref 140–400)
RBC: 4.17 10*6/uL — ABNORMAL LOW (ref 4.20–5.82)
RDW: 14.5 % (ref 11.0–14.6)
WBC: 3.6 10*3/uL — ABNORMAL LOW (ref 4.0–10.3)
lymph#: 1.1 10*3/uL (ref 0.9–3.3)

## 2017-06-06 LAB — COMPREHENSIVE METABOLIC PANEL
ALT: 12 U/L (ref 0–55)
AST: 14 U/L (ref 5–34)
Albumin: 3.6 g/dL (ref 3.5–5.0)
Alkaline Phosphatase: 48 U/L (ref 40–150)
Anion Gap: 10 mEq/L (ref 3–11)
BUN: 15.3 mg/dL (ref 7.0–26.0)
CHLORIDE: 111 meq/L — AB (ref 98–109)
CO2: 23 mEq/L (ref 22–29)
CREATININE: 1 mg/dL (ref 0.7–1.3)
Calcium: 9.1 mg/dL (ref 8.4–10.4)
EGFR: >60 mL/min/{1.73_m2} (ref >60)
Glucose: 95 mg/dl (ref 70–140)
POTASSIUM: 3.8 meq/L (ref 3.5–5.1)
SODIUM: 143 meq/L (ref 136–145)
Total Bilirubin: 0.49 mg/dL (ref 0.20–1.20)
Total Protein: 6.4 g/dL (ref 6.4–8.3)

## 2017-06-06 MED ORDER — FAMOTIDINE IN NACL 20-0.9 MG/50ML-% IV SOLN
20.0000 mg | Freq: Once | INTRAVENOUS | Status: AC
  Administered 2017-06-06: 20 mg via INTRAVENOUS

## 2017-06-06 MED ORDER — HEPARIN SOD (PORK) LOCK FLUSH 100 UNIT/ML IV SOLN
500.0000 [IU] | Freq: Once | INTRAVENOUS | Status: DC
  Filled 2017-06-06: qty 5

## 2017-06-06 MED ORDER — PROCHLORPERAZINE MALEATE 10 MG PO TABS
ORAL_TABLET | ORAL | Status: AC
  Filled 2017-06-06: qty 1

## 2017-06-06 MED ORDER — DEXAMETHASONE SODIUM PHOSPHATE 10 MG/ML IJ SOLN
10.0000 mg | Freq: Once | INTRAMUSCULAR | Status: AC
  Administered 2017-06-06: 10 mg via INTRAVENOUS

## 2017-06-06 MED ORDER — SODIUM CHLORIDE 0.9% FLUSH
10.0000 mL | Freq: Once | INTRAVENOUS | Status: AC
  Administered 2017-06-06: 10 mL
  Filled 2017-06-06: qty 10

## 2017-06-06 MED ORDER — HEPARIN SOD (PORK) LOCK FLUSH 100 UNIT/ML IV SOLN
500.0000 [IU] | Freq: Once | INTRAVENOUS | Status: AC | PRN
  Administered 2017-06-06: 500 [IU]
  Filled 2017-06-06: qty 5

## 2017-06-06 MED ORDER — SODIUM CHLORIDE 0.9 % IV SOLN
Freq: Once | INTRAVENOUS | Status: AC
  Administered 2017-06-06: 13:00:00 via INTRAVENOUS

## 2017-06-06 MED ORDER — ACETAMINOPHEN 325 MG PO TABS
ORAL_TABLET | ORAL | Status: AC
  Filled 2017-06-06: qty 1

## 2017-06-06 MED ORDER — MONTELUKAST SODIUM 10 MG PO TABS
ORAL_TABLET | ORAL | Status: AC
  Filled 2017-06-06: qty 1

## 2017-06-06 MED ORDER — SODIUM CHLORIDE 0.9% FLUSH
10.0000 mL | INTRAVENOUS | Status: DC | PRN
  Administered 2017-06-06: 10 mL
  Filled 2017-06-06: qty 10

## 2017-06-06 MED ORDER — PROCHLORPERAZINE MALEATE 10 MG PO TABS
10.0000 mg | ORAL_TABLET | Freq: Once | ORAL | Status: AC
  Administered 2017-06-06: 10 mg via ORAL

## 2017-06-06 MED ORDER — DEXAMETHASONE SODIUM PHOSPHATE 10 MG/ML IJ SOLN
INTRAMUSCULAR | Status: AC
  Filled 2017-06-06: qty 1

## 2017-06-06 MED ORDER — DARATUMUMAB CHEMO INJECTION 400 MG/20ML
1200.0000 mg | Freq: Once | INTRAVENOUS | Status: AC
  Administered 2017-06-06: 1200 mg via INTRAVENOUS
  Filled 2017-06-06: qty 60

## 2017-06-06 MED ORDER — ACETAMINOPHEN 325 MG PO TABS
650.0000 mg | ORAL_TABLET | Freq: Once | ORAL | Status: AC
  Administered 2017-06-06: 650 mg via ORAL

## 2017-06-06 MED ORDER — FAMOTIDINE IN NACL 20-0.9 MG/50ML-% IV SOLN
INTRAVENOUS | Status: AC
  Filled 2017-06-06: qty 50

## 2017-06-06 MED ORDER — MONTELUKAST SODIUM 10 MG PO TABS
10.0000 mg | ORAL_TABLET | Freq: Once | ORAL | Status: AC
  Administered 2017-06-06: 10 mg via ORAL

## 2017-06-06 MED ORDER — SODIUM CHLORIDE 0.9 % IV SOLN: Freq: Once | INTRAVENOUS | Status: DC

## 2017-06-06 NOTE — Patient Instructions (Addendum)
[facility] Discharge Instructions for Patients Receiving Chemotherapy  Today you received the following chemotherapy agents Daratumumab   To help prevent nausea and vomiting after your treatment, we encourage you to take your nausea medication as directed.   If you develop nausea and vomiting that is not controlled by your nausea medication, call the clinic.   BELOW ARE SYMPTOMS THAT SHOULD BE REPORTED IMMEDIATELY:  *FEVER GREATER THAN 100.5 F  *CHILLS WITH OR WITHOUT FEVER  NAUSEA AND VOMITING THAT IS NOT CONTROLLED WITH YOUR NAUSEA MEDICATION  *UNUSUAL SHORTNESS OF BREATH  *UNUSUAL BRUISING OR BLEEDING  TENDERNESS IN MOUTH AND THROAT WITH OR WITHOUT PRESENCE OF ULCERS  *URINARY PROBLEMS  *BOWEL PROBLEMS  UNUSUAL RASH Items with * indicate a potential emergency and should be followed up as soon as possible.  Feel free to call the clinic should you have any questions or concerns. The clinic phone number is [phone].  Please show the CHEMO ALERT CARD at check-in to the Emergency Department and triage nurse.   

## 2017-06-07 LAB — KAPPA/LAMBDA LIGHT CHAINS
IG KAPPA FREE LIGHT CHAIN: 4.4 mg/L (ref 3.3–19.4)
IG LAMBDA FREE LIGHT CHAIN: 7.7 mg/L (ref 5.7–26.3)
KAPPA/LAMBDA FLC RATIO: 0.57 (ref 0.26–1.65)

## 2017-06-09 LAB — MULTIPLE MYELOMA PANEL, SERUM
ALBUMIN SERPL ELPH-MCNC: 3.6 g/dL (ref 2.9–4.4)
ALPHA 1: 0.2 g/dL (ref 0.0–0.4)
ALPHA2 GLOB SERPL ELPH-MCNC: 0.9 g/dL (ref 0.4–1.0)
Albumin/Glob SerPl: 1.5 (ref 0.7–1.7)
B-Globulin SerPl Elph-Mcnc: 1 g/dL (ref 0.7–1.3)
GAMMA GLOB SERPL ELPH-MCNC: 0.5 g/dL (ref 0.4–1.8)
Globulin, Total: 2.5 g/dL (ref 2.2–3.9)
IGA/IMMUNOGLOBULIN A, SERUM: 58 mg/dL — AB (ref 61–437)
IgG, Qn, Serum: 531 mg/dL — ABNORMAL LOW (ref 700–1600)
IgM, Qn, Serum: 10 mg/dL — ABNORMAL LOW (ref 20–172)
M Protein SerPl Elph-Mcnc: 0.1 g/dL — ABNORMAL HIGH
TOTAL PROTEIN: 6.1 g/dL (ref 6.0–8.5)

## 2017-06-17 ENCOUNTER — Other Ambulatory Visit: Payer: Self-pay | Admitting: Hematology and Oncology

## 2017-06-19 ENCOUNTER — Ambulatory Visit: Payer: Medicaid Other | Admitting: Hematology and Oncology

## 2017-06-19 ENCOUNTER — Other Ambulatory Visit: Payer: Medicaid Other

## 2017-06-20 ENCOUNTER — Telehealth: Payer: Self-pay | Admitting: Hematology and Oncology

## 2017-06-20 ENCOUNTER — Ambulatory Visit: Payer: Medicaid Other

## 2017-06-20 ENCOUNTER — Other Ambulatory Visit (HOSPITAL_BASED_OUTPATIENT_CLINIC_OR_DEPARTMENT_OTHER): Payer: Medicaid Other

## 2017-06-20 ENCOUNTER — Encounter: Payer: Self-pay | Admitting: Hematology and Oncology

## 2017-06-20 ENCOUNTER — Ambulatory Visit (HOSPITAL_BASED_OUTPATIENT_CLINIC_OR_DEPARTMENT_OTHER): Payer: Medicaid Other | Admitting: Hematology and Oncology

## 2017-06-20 ENCOUNTER — Ambulatory Visit (HOSPITAL_BASED_OUTPATIENT_CLINIC_OR_DEPARTMENT_OTHER): Payer: Medicaid Other

## 2017-06-20 VITALS — BP 144/84 | HR 82 | Temp 98.0°F

## 2017-06-20 DIAGNOSIS — C9002 Multiple myeloma in relapse: Secondary | ICD-10-CM

## 2017-06-20 DIAGNOSIS — D6481 Anemia due to antineoplastic chemotherapy: Secondary | ICD-10-CM | POA: Diagnosis not present

## 2017-06-20 DIAGNOSIS — Z5112 Encounter for antineoplastic immunotherapy: Secondary | ICD-10-CM

## 2017-06-20 DIAGNOSIS — Z9481 Bone marrow transplant status: Secondary | ICD-10-CM | POA: Diagnosis not present

## 2017-06-20 DIAGNOSIS — T451X5A Adverse effect of antineoplastic and immunosuppressive drugs, initial encounter: Secondary | ICD-10-CM

## 2017-06-20 DIAGNOSIS — C9001 Multiple myeloma in remission: Secondary | ICD-10-CM

## 2017-06-20 DIAGNOSIS — Z23 Encounter for immunization: Secondary | ICD-10-CM

## 2017-06-20 LAB — COMPREHENSIVE METABOLIC PANEL
ALBUMIN: 3.3 g/dL — AB (ref 3.5–5.0)
ALT: 14 U/L (ref 0–55)
ANION GAP: 7 meq/L (ref 3–11)
AST: 11 U/L (ref 5–34)
Alkaline Phosphatase: 47 U/L (ref 40–150)
BILIRUBIN TOTAL: 0.34 mg/dL (ref 0.20–1.20)
BUN: 11.1 mg/dL (ref 7.0–26.0)
CALCIUM: 9 mg/dL (ref 8.4–10.4)
CO2: 25 mEq/L (ref 22–29)
CREATININE: 1 mg/dL (ref 0.7–1.3)
Chloride: 110 mEq/L — ABNORMAL HIGH (ref 98–109)
EGFR: >60 mL/min/{1.73_m2} (ref >60)
Glucose: 103 mg/dl (ref 70–140)
Potassium: 3.8 mEq/L (ref 3.5–5.1)
Sodium: 142 mEq/L (ref 136–145)
TOTAL PROTEIN: 6 g/dL — AB (ref 6.4–8.3)

## 2017-06-20 LAB — CBC WITH DIFFERENTIAL/PLATELET
BASO%: 1.2 % (ref 0.0–2.0)
Basophils Absolute: 0.1 10*3/uL (ref 0.0–0.1)
EOS%: 13.3 % — AB (ref 0.0–7.0)
Eosinophils Absolute: 0.6 10*3/uL — ABNORMAL HIGH (ref 0.0–0.5)
HCT: 37.9 % — ABNORMAL LOW (ref 38.4–49.9)
HEMOGLOBIN: 12.7 g/dL — AB (ref 13.0–17.1)
LYMPH%: 24.1 % (ref 14.0–49.0)
MCH: 31.5 pg (ref 27.2–33.4)
MCHC: 33.5 g/dL (ref 32.0–36.0)
MCV: 94 fL (ref 79.3–98.0)
MONO#: 0.6 10*3/uL (ref 0.1–0.9)
MONO%: 15.4 % — ABNORMAL HIGH (ref 0.0–14.0)
NEUT%: 46 % (ref 39.0–75.0)
NEUTROS ABS: 1.9 10*3/uL (ref 1.5–6.5)
Platelets: 162 10*3/uL (ref 140–400)
RBC: 4.03 10*6/uL — AB (ref 4.20–5.82)
RDW: 14.5 % (ref 11.0–14.6)
WBC: 4.2 10*3/uL (ref 4.0–10.3)
lymph#: 1 10*3/uL (ref 0.9–3.3)

## 2017-06-20 MED ORDER — SODIUM CHLORIDE 0.9% FLUSH
10.0000 mL | Freq: Once | INTRAVENOUS | Status: AC
  Administered 2017-06-20: 10 mL
  Filled 2017-06-20: qty 10

## 2017-06-20 MED ORDER — DEXAMETHASONE SODIUM PHOSPHATE 10 MG/ML IJ SOLN
INTRAMUSCULAR | Status: AC
  Filled 2017-06-20: qty 1

## 2017-06-20 MED ORDER — ACETAMINOPHEN 325 MG PO TABS
650.0000 mg | ORAL_TABLET | Freq: Once | ORAL | Status: AC
  Administered 2017-06-20: 650 mg via ORAL

## 2017-06-20 MED ORDER — HEPARIN SOD (PORK) LOCK FLUSH 100 UNIT/ML IV SOLN
500.0000 [IU] | Freq: Once | INTRAVENOUS | Status: AC | PRN
  Administered 2017-06-20: 500 [IU]
  Filled 2017-06-20: qty 5

## 2017-06-20 MED ORDER — ACETAMINOPHEN 325 MG PO TABS
ORAL_TABLET | ORAL | Status: AC
  Filled 2017-06-20: qty 2

## 2017-06-20 MED ORDER — MONTELUKAST SODIUM 10 MG PO TABS
ORAL_TABLET | ORAL | Status: AC
  Filled 2017-06-20: qty 1

## 2017-06-20 MED ORDER — DEXAMETHASONE SODIUM PHOSPHATE 10 MG/ML IJ SOLN
10.0000 mg | Freq: Once | INTRAMUSCULAR | Status: AC
  Administered 2017-06-20: 10 mg via INTRAVENOUS

## 2017-06-20 MED ORDER — PROCHLORPERAZINE MALEATE 10 MG PO TABS
10.0000 mg | ORAL_TABLET | Freq: Once | ORAL | Status: AC
  Administered 2017-06-20: 10 mg via ORAL

## 2017-06-20 MED ORDER — SODIUM CHLORIDE 0.9 % IV SOLN
Freq: Once | INTRAVENOUS | Status: AC
  Administered 2017-06-20: 10:00:00 via INTRAVENOUS

## 2017-06-20 MED ORDER — MONTELUKAST SODIUM 10 MG PO TABS
10.0000 mg | ORAL_TABLET | Freq: Once | ORAL | Status: AC
  Administered 2017-06-20: 10 mg via ORAL

## 2017-06-20 MED ORDER — SODIUM CHLORIDE 0.9% FLUSH
10.0000 mL | INTRAVENOUS | Status: DC | PRN
  Administered 2017-06-20: 10 mL
  Filled 2017-06-20: qty 10

## 2017-06-20 MED ORDER — SODIUM CHLORIDE 0.9 % IV SOLN
15.7000 mg/kg | Freq: Once | INTRAVENOUS | Status: AC
  Administered 2017-06-20: 1200 mg via INTRAVENOUS
  Filled 2017-06-20: qty 60

## 2017-06-20 MED ORDER — FAMOTIDINE IN NACL 20-0.9 MG/50ML-% IV SOLN
INTRAVENOUS | Status: AC
  Filled 2017-06-20: qty 50

## 2017-06-20 MED ORDER — PROCHLORPERAZINE MALEATE 10 MG PO TABS
ORAL_TABLET | ORAL | Status: AC
  Filled 2017-06-20: qty 1

## 2017-06-20 MED ORDER — INFLUENZA VAC SPLIT QUAD 0.5 ML IM SUSY
0.5000 mL | PREFILLED_SYRINGE | Freq: Once | INTRAMUSCULAR | Status: AC
  Administered 2017-06-20: 0.5 mL via INTRAMUSCULAR
  Filled 2017-06-20: qty 0.5

## 2017-06-20 MED ORDER — FAMOTIDINE IN NACL 20-0.9 MG/50ML-% IV SOLN
20.0000 mg | Freq: Once | INTRAVENOUS | Status: AC
  Administered 2017-06-20: 20 mg via INTRAVENOUS

## 2017-06-20 NOTE — Progress Notes (Signed)
[place] OFFICE PROGRESS NOTE  Patient Care Team: Arnoldo Morale, MD as PCP - General (Family Medicine) Melburn Hake, Costella Hatcher, MD as Consulting Physician (Hematology and Oncology)  SUMMARY OF ONCOLOGIC HISTORY:   Multiple myeloma in relapse [facility])   08/28/2015 Bone Marrow Biopsy    Accession: [id number] biopsy showed 58% myeloma involvement. Cytogenetics was normal and FISH showed Del 13q and t(11;14).       09/07/2015 - 01/22/2016 Chemotherapy    he received 6 cycles of treatment of Velcade and Revlimid with Dex       09/12/2015 - 09/16/2015 Hospital Admission    He was admitted to the hospital with sepsis, treatment was placed on hold and he received one dose of GCSF for leukopenia      01/12/2016 Adverse Reaction    Delay treatment due to neutropenia      01/27/2016 Imaging    ECHO at [facility] showed normal EF      01/27/2016 PET scan    PET scan at [facility] showed innumerable lytic lesions throughout      01/27/2016 Procedure    PFT is within normal limits at [facility]      01/28/2016 Bone Marrow Biopsy    Bone marrow at [facility] showed persistent plasma cell myeloma in a normocellularmarrow (30%) with 20% atypical plasma cells and myeloid hyperplasia.      02/11/2016 - 06/10/2016 Chemotherapy    He is started on salvage Rx with Kyprolis, Cytoxan and dexamethasone      03/24/2016 Imaging    US venous Doppler showed no evidence of deep vein or superficial thrombosis involving the right lower extremity and left lower extremity. No evidence of Baker&'s cyst on the right or left.      05/18/2016 - 05/19/2016 Hospital Admission    He was admitted for chest pain evaluation. Cardiac work-up excluded cardiac events and CT angiogram were negative for PE. It showed esphageal thickening c/w reflux esophagitis likely cause for his chest discomfort      05/23/2016 - 05/25/2016 Hospital Admission    Patient was hospitalized for worsening shortness of breath and  worsening pain. CT angiogram chest negative for pulmonary emboli. Patient had a recent normal 2-D echo and ruled out for MI. Patient noted to be dehydrated. Patient admitted placed on IV fluids. Diuretic discontinued. Patient's gabapentin has been increased per recommendations from prior office visit from patient's oncologist to 600 mg 3 times daily      06/15/2016 Bone Marrow Biopsy    Bone marrow biopsy showed only 2% plasma cell, normal FISH and cytogenetics      07/26/2016 - 07/26/2016 Chemotherapy    He received high dose melphalan at [facility]       07/27/2016 Bone Marrow Transplant    He received autologous stem cell transplant at [facility]      08/05/2016 - 08/10/2016 Hospital Admission    He was admitted to [facility] for management of neutropenic fever and diarrhea. Cultures were negative. Stool culture came back positive for Novovirus and he was managed conservatively.      11/03/2016 Bone Marrow Biopsy    Bone Marrow Flow Cytometry - NO MONOCLONAL B CELL POPULATION IDENTIFIED. - PREDOMINANCE OF T CELLS WITH NON SPECIFIC CHANGES. - SEE NOTE. Diagnosis Comment: Analysis of the lymphoid population shows predominance of T lymphocytes expressing pan T-cell antigens but with relative abundance of CD8 positive cells and reversal of the CD4:CD8 ratio. No significant CD16/56 expression is identified. The T-cell changes  are not considered specific in this setting. B cells represent the minor population with no monoclonality or abnormal phenotype. 4% lambda light chain restricted plasma cells. FISH positive CCND1/IGH 12% of cells.       11/09/2016 PET scan    No FDG avid myeloma is identified. Numerous osseous lytic lesions as above. 2.Hypermetabolic left level III lymph node, likely reactive etiology.  3.Ancillary CT findings as above.      11/29/2016 -  Chemotherapy    He received treatment with daratumumab and Pomalyst       12/13/2016 - 12/16/2016 Hospital Admission    He was  admitted to the hospital for management of dehydration and early sepsis      01/23/2017 Procedure    Status post CT-guided bone marrow biopsy, with tissue specimen sent to pathology for complete histopathologic analysis      01/23/2017 Bone Marrow Biopsy    Bone Marrow, Aspirate,Biopsy, and Clot - SLIGHTLY HYPOCELLULAR BONE MARROW FOR AGE WITH TRILINEAGE HEMATOPOIESIS AND 1% PLASMA CELLS. - SEE COMMENT. PERIPHERAL BLOOD: - NORMOCYTIC-NORMOCHROMIC ANEMIA. - LEUKOPENIA. Diagnosis Note The bone marrow is slightly hypocellular for age with trilineage hematopoiesis and non specific myeloid changes likely related to previous treatment. The plasma cells represent 1% of all cells in the aspirate with lack of large aggregate or sheets. To further evaluate the plasma cell component, immunohistochemical stains will be performed and the results reported in an addendum. Correlation with cytogenetic and FISH studies recommended.      01/31/2017 Adverse Reaction    Treatment was delayed due to SOB      01/31/2017 Imaging    1. No evidence of pulmonary embolism. Sensitivity is moderately degraded by respiratory motion. 2. Multiple myeloma.  Stable compression fracture at T9. 3. Coronary artery calcifications       INTERVAL HISTORY: Please see below for problem oriented charting. He returns for further follow-up He denies recent infection He had recent dental extraction 2 weeks ago, healing well No new bone pain No recent neuropathy  REVIEW OF SYSTEMS:   Constitutional: Denies fevers, chills or abnormal weight loss Eyes: Denies blurriness of vision Ears, nose, mouth, throat, and face: Denies mucositis or sore throat Respiratory: Denies cough, dyspnea or wheezes Cardiovascular: Denies palpitation, chest discomfort or lower extremity swelling Gastrointestinal:  Denies nausea, heartburn or change in bowel habits Skin: Denies abnormal skin rashes Lymphatics: Denies new lymphadenopathy or easy  bruising Neurological:Denies numbness, tingling or new weaknesses Behavioral/Psych: Mood is stable, no new changes  All other systems were reviewed with the patient and are negative.  I have reviewed the past medical history, past surgical history, social history and family history with the patient and they are unchanged from previous note.  ALLERGIES:  is allergic to benadryl [diphenhydramine]; trazodone and nefazodone; and latex.  MEDICATIONS:  Current Outpatient Medications  Medication Sig Dispense Refill  . acetaminophen (TYLENOL) 500 MG tablet Take 1,000 mg by mouth every 6 (six) hours as needed for fever.    Marland Kitchen acyclovir (ZOVIRAX) 400 MG tablet Take 1 tablet (400 mg total) by mouth 2 (two) times daily. 60 tablet 6  . aluminum-magnesium hydroxide-simethicone (MAALOX) 937-342-87 MG/5ML SUSP Take 30 mLs by mouth every 4 (four) hours as needed.    Marland Kitchen aspirin 81 MG chewable tablet Chew 81 mg by mouth daily.    Marland Kitchen CALCIUM PO Take 1 tablet by mouth daily.    . cholecalciferol (VITAMIN D) 1000 units tablet Take 1,000 Units by mouth daily.     Marland Kitchen  folic acid (FOLVITE) 1 MG tablet Take 1 tablet (1 mg total) by mouth daily. 90 tablet 1  . gabapentin (NEURONTIN) 300 MG capsule Take 1-2 capsules (300-600 mg total) by mouth 2 (two) times daily. Take 300 mg in the morning and 600 mg at night. 100 capsule 9  . hydrochlorothiazide (HYDRODIURIL) 25 MG tablet Take 1 tablet (25 mg total) by mouth daily. 30 tablet 9  . lidocaine-prilocaine (EMLA) cream Apply to affected area once 30 g 3  . lisinopril (PRINIVIL,ZESTRIL) 20 MG tablet Take 1 tablet (20 mg total) by mouth daily. 30 tablet 6  . loperamide (IMODIUM) 2 MG capsule Take 2 mg by mouth 4 (four) times daily as needed.    . metFORMIN (GLUCOPHAGE) 500 MG tablet Take 1 tablet (500 mg total) by mouth daily with breakfast. 30 tablet 5  . Multiple Vitamin (MULTIVITAMIN) tablet Take 1 tablet by mouth daily.  1  . ondansetron (ZOFRAN) 8 MG tablet Take 8 mg by  mouth every 8 (eight) hours as needed.    . pantoprazole (PROTONIX) 40 MG tablet Take 1 tablet (40 mg total) by mouth daily. 30 tablet 6  . pomalidomide (POMALYST) 2 MG capsule Take 1 capsule (2 mg total) by mouth daily. Take with water on days 1-21. Repeat every 28 days. 21 capsule 0  . prochlorperazine (COMPAZINE) 10 MG tablet Take 10 mg by mouth every 6 (six) hours as needed.    . ranitidine (ZANTAC) 150 MG tablet TAKE 1 TABLET BY MOUTH AT BEDTIME 30 tablet 1  . traMADol (ULTRAM) 50 MG tablet Take 1-2 tablets (50-100 mg total) by mouth every 6 (six) hours as needed. for pain 90 tablet 0  . zolpidem (AMBIEN) 10 MG tablet Take 1 tablet (10 mg total) by mouth at bedtime as needed for sleep. 30 tablet 3   No current facility-administered medications for this visit.    Facility-Administered Medications Ordered in Other Visits  Medication Dose Route Frequency Provider Last Rate Last Dose  . sodium chloride flush (NS) 0.9 % injection 10 mL  10 mL Intracatheter PRN Heath Lark, MD   10 mL at 03/21/17 3545    PHYSICAL EXAMINATION: ECOG PERFORMANCE STATUS: 1 - Symptomatic but completely ambulatory  Vitals:   06/20/17 0858  BP: 140/87  Pulse: 80  Resp: 18  Temp: 99.2 F (37.3 C)  SpO2: 95%   Filed Weights   06/20/17 0858  Weight: 177 lb 9.6 oz (80.6 kg)    GENERAL:alert, no distress and comfortable SKIN: skin color, texture, turgor are normal, no rashes or significant lesions EYES: normal, Conjunctiva are pink and non-injected, sclera clear OROPHARYNX:no exudate, no erythema and lips, buccal mucosa, and tongue normal  NECK: supple, thyroid normal size, non-tender, without nodularity LYMPH:  no palpable lymphadenopathy in the cervical, axillary or inguinal LUNGS: clear to auscultation and percussion with normal breathing effort HEART: regular rate & rhythm and no murmurs and no lower extremity edema ABDOMEN:abdomen soft, non-tender and normal bowel sounds Musculoskeletal:no cyanosis  of digits and no clubbing  NEURO: alert & oriented x 3 with fluent speech, no focal motor/sensory deficits  LABORATORY DATA:  I have reviewed the data as listed    Component Value Date/Time   NA 142 06/20/2017 0829   K 3.8 06/20/2017 0829   CL 102 12/16/2016 0418   CO2 25 06/20/2017 0829   GLUCOSE 103 06/20/2017 0829   BUN 11.1 06/20/2017 0829   CREATININE 1.0 06/20/2017 0829   CALCIUM 9.0 06/20/2017 0829  PROT 6.0 (L) 06/20/2017 0829   ALBUMIN 3.3 (L) 06/20/2017 0829   AST 11 06/20/2017 0829   ALT 14 06/20/2017 0829   ALKPHOS 47 06/20/2017 0829   BILITOT 0.34 06/20/2017 0829   GFRNONAA >60 12/16/2016 0418   GFRNONAA 71 07/24/2015 1155   GFRAA >60 12/16/2016 0418   GFRAA 82 07/24/2015 1155    No results found for: SPEP, UPEP  Lab Results  Component Value Date   WBC 4.2 06/20/2017   NEUTROABS 1.9 06/20/2017   HGB 12.7 (L) 06/20/2017   HCT 37.9 (L) 06/20/2017   MCV 94.0 06/20/2017   PLT 162 06/20/2017      Chemistry      Component Value Date/Time   NA 142 06/20/2017 0829   K 3.8 06/20/2017 0829   CL 102 12/16/2016 0418   CO2 25 06/20/2017 0829   BUN 11.1 06/20/2017 0829   CREATININE 1.0 06/20/2017 0829      Component Value Date/Time   CALCIUM 9.0 06/20/2017 0829   ALKPHOS 47 06/20/2017 0829   AST 11 06/20/2017 0829   ALT 14 06/20/2017 0829   BILITOT 0.34 06/20/2017 0829     ASSESSMENT & PLAN:  Multiple myeloma in relapse (HCC) Recent myeloma panel and bone marrow biopsy confirmed that the patient has achieved near complete response to treatment He has recently completed dexamethasone taper He is reminded to take calcium, vitamin D and will receive Zometa every 3 months I plan to hold Zometa due to recent dental extraction.  He can resume Zometa in December He will continue antimicrobial treatment with acyclovir and aspirin for DVT prophylaxis  Anemia due to antineoplastic chemotherapy This is likely anemia of chronic disease. The patient denies recent  history of bleeding such as epistaxis, hematuria or hematochezia. He is asymptomatic from the anemia. We will observe for now.   S/P autologous bone marrow transplantation ([facility]) Recent bone marrow biopsy showed complete remission He will continue antimicrobial treatment as prescribed We discussed the importance of preventive care and reviewed the vaccination programs. He does not have any prior allergic reactions to influenza vaccination. He agrees to proceed with influenza vaccination today and we will administer it today at the clinic.    No orders of the defined types were placed in this encounter.  All questions were answered. The patient knows to call the clinic with any problems, questions or concerns. No barriers to learning was detected. I spent 15 minutes counseling the patient face to face. The total time spent in the appointment was 20 minutes and more than 50% was on counseling and review of test results     Heath Lark, MD 06/20/2017 5:14 PM

## 2017-06-20 NOTE — Assessment & Plan Note (Signed)
Recent bone marrow biopsy showed complete remission He will continue antimicrobial treatment as prescribed We discussed the importance of preventive care and reviewed the vaccination programs. He does not have any prior allergic reactions to influenza vaccination. He agrees to proceed with influenza vaccination today and we will administer it today at the clinic.

## 2017-06-20 NOTE — Telephone Encounter (Signed)
Scheduled appt pe r1/16 los - Gave patient AVS and calender per los.  

## 2017-06-20 NOTE — Patient Instructions (Signed)
Daratumumab injection What is this medicine? DARATUMUMAB (dar a toom ue mab) is a monoclonal antibody. It is used to treat multiple myeloma. This medicine may be used for other purposes; ask your health care provider or pharmacist if you have questions. COMMON BRAND NAME(S): DARZALEX What should I tell my health care provider before I take this medicine? They need to know if you have any of these conditions: -infection (especially a virus infection such as chickenpox, cold sores, or herpes) -lung or breathing disease -pregnant or trying to get pregnant -breast-feeding -an unusual or allergic reaction to daratumumab, other medicines, foods, dyes, or preservatives How should I use this medicine? This medicine is for infusion into a vein. It is given by a health care professional in a hospital or clinic setting. Talk to your pediatrician regarding the use of this medicine in children. Special care may be needed. Overdosage: If you think you have taken too much of this medicine contact a poison control center or emergency room at once. NOTE: This medicine is only for you. Do not share this medicine with others. What if I miss a dose? Keep appointments for follow-up doses as directed. It is important not to miss your dose. Call your doctor or health care professional if you are unable to keep an appointment. What may interact with this medicine? Interactions have not been studied. Give your health care provider a list of all the medicines, herbs, non-prescription drugs, or dietary supplements you use. Also tell them if you smoke, drink alcohol, or use illegal drugs. Some items may interact with your medicine. This list may not describe all possible interactions. Give your health care provider a list of all the medicines, herbs, non-prescription drugs, or dietary supplements you use. Also tell them if you smoke, drink alcohol, or use illegal drugs. Some items may interact with your medicine. What  should I watch for while using this medicine? This drug may make you feel generally unwell. Report any side effects. Continue your course of treatment even though you feel ill unless your doctor tells you to stop. This medicine can cause serious allergic reactions. To reduce your risk you may need to take medicine before treatment with this medicine. Take your medicine as directed. This medicine can affect the results of blood tests to match your blood type. These changes can last for up to 6 months after the final dose. Your healthcare provider will do blood tests to match your blood type before you start treatment. Tell all of your healthcare providers that you are being treated with this medicine before receiving a blood transfusion. This medicine can affect the results of some tests used to determine treatment response; extra tests may be needed to evaluate response. Do not become pregnant while taking this medicine or for 3 months after stopping it. Women should inform their doctor if they wish to become pregnant or think they might be pregnant. There is a potential for serious side effects to an unborn child. Talk to your health care professional or pharmacist for more information. What side effects may I notice from receiving this medicine? Side effects that you should report to your doctor or health care professional as soon as possible: -allergic reactions like skin rash, itching or hives, swelling of the face, lips, or tongue -breathing problems -chills -cough -dizziness -feeling faint or lightheaded -headache -low blood counts - this medicine may decrease the number of white blood cells, red blood cells and platelets. You may be at increased risk  for infections and bleeding. -nausea, vomiting -shortness of breath -signs of decreased platelets or bleeding - bruising, pinpoint red spots on the skin, black, tarry stools, blood in the urine -signs of decreased red blood cells - unusually  weak or tired, feeling faint or lightheaded, falls -signs of infection - fever or chills, cough, sore throat, pain or difficulty passing urine Side effects that usually do not require medical attention (report to your doctor or health care professional if they continue or are bothersome): -back pain -diarrhea -muscle cramps -pain, tingling, numbness in the hands or feet -swelling of the ankles, feet, hands -tiredness This list may not describe all possible side effects. Call your doctor for medical advice about side effects. You may report side effects to FDA at 1-800-FDA-1088. Where should I keep my medicine? Keep out of the reach of children. This drug is given in a hospital or clinic and will not be stored at home. NOTE: This sheet is a summary. It may not cover all possible information. If you have questions about this medicine, talk to your doctor, pharmacist, or health care provider.  2018 Elsevier/Gold Standard (2015-09-03 10:38:11)  

## 2017-06-20 NOTE — Assessment & Plan Note (Signed)
Recent myeloma panel and bone marrow biopsy confirmed that the patient has achieved near complete response to treatment He has recently completed dexamethasone taper He is reminded to take calcium, vitamin D and will receive Zometa every 3 months I plan to hold Zometa due to recent dental extraction.  He can resume Zometa in December He will continue antimicrobial treatment with acyclovir and aspirin for DVT prophylaxis

## 2017-06-20 NOTE — Assessment & Plan Note (Signed)
This is likely anemia of chronic disease. The patient denies recent history of bleeding such as epistaxis, hematuria or hematochezia. He is asymptomatic from the anemia. We will observe for now.  

## 2017-06-23 ENCOUNTER — Other Ambulatory Visit: Payer: Self-pay

## 2017-06-23 LAB — MULTIPLE MYELOMA PANEL, SERUM
ALBUMIN SERPL ELPH-MCNC: 3.3 g/dL (ref 2.9–4.4)
ALBUMIN/GLOB SERPL: 1.5 (ref 0.7–1.7)
ALPHA 1: 0.2 g/dL (ref 0.0–0.4)
ALPHA2 GLOB SERPL ELPH-MCNC: 0.8 g/dL (ref 0.4–1.0)
B-Globulin SerPl Elph-Mcnc: 0.8 g/dL (ref 0.7–1.3)
GLOBULIN, TOTAL: 2.3 g/dL (ref 2.2–3.9)
Gamma Glob SerPl Elph-Mcnc: 0.4 g/dL (ref 0.4–1.8)
IGA/IMMUNOGLOBULIN A, SERUM: 62 mg/dL (ref 61–437)
IGG (IMMUNOGLOBIN G), SERUM: 479 mg/dL — AB (ref 700–1600)
IGM (IMMUNOGLOBIN M), SRM: 14 mg/dL — AB (ref 20–172)
M Protein SerPl Elph-Mcnc: 0.1 g/dL — ABNORMAL HIGH
Total Protein: 5.6 g/dL — ABNORMAL LOW (ref 6.0–8.5)

## 2017-06-23 MED ORDER — POMALIDOMIDE 2 MG PO CAPS: 2.0000 mg | ORAL_CAPSULE | Freq: Every day | ORAL | 0 refills | Status: DC

## 2017-07-11 ENCOUNTER — Encounter: Payer: Self-pay | Admitting: Family Medicine

## 2017-07-11 ENCOUNTER — Ambulatory Visit: Payer: Medicaid Other | Attending: Family Medicine | Admitting: Family Medicine

## 2017-07-11 VITALS — BP 154/76 | HR 87 | Temp 97.6°F | Resp 18 | Ht 66.0 in | Wt 183.2 lb

## 2017-07-11 DIAGNOSIS — Z8619 Personal history of other infectious and parasitic diseases: Secondary | ICD-10-CM | POA: Diagnosis not present

## 2017-07-11 DIAGNOSIS — K219 Gastro-esophageal reflux disease without esophagitis: Secondary | ICD-10-CM | POA: Insufficient documentation

## 2017-07-11 DIAGNOSIS — Z91040 Latex allergy status: Secondary | ICD-10-CM | POA: Diagnosis not present

## 2017-07-11 DIAGNOSIS — Z7982 Long term (current) use of aspirin: Secondary | ICD-10-CM | POA: Insufficient documentation

## 2017-07-11 DIAGNOSIS — C9002 Multiple myeloma in relapse: Secondary | ICD-10-CM | POA: Diagnosis not present

## 2017-07-11 DIAGNOSIS — Z9481 Bone marrow transplant status: Secondary | ICD-10-CM | POA: Insufficient documentation

## 2017-07-11 DIAGNOSIS — R7303 Prediabetes: Secondary | ICD-10-CM | POA: Diagnosis not present

## 2017-07-11 DIAGNOSIS — Z888 Allergy status to other drugs, medicaments and biological substances status: Secondary | ICD-10-CM | POA: Diagnosis not present

## 2017-07-11 DIAGNOSIS — I10 Essential (primary) hypertension: Secondary | ICD-10-CM | POA: Insufficient documentation

## 2017-07-11 DIAGNOSIS — Z79899 Other long term (current) drug therapy: Secondary | ICD-10-CM | POA: Diagnosis not present

## 2017-07-11 DIAGNOSIS — Z7984 Long term (current) use of oral hypoglycemic drugs: Secondary | ICD-10-CM | POA: Diagnosis not present

## 2017-07-11 DIAGNOSIS — R600 Localized edema: Secondary | ICD-10-CM | POA: Insufficient documentation

## 2017-07-11 LAB — POCT GLYCOSYLATED HEMOGLOBIN (HGB A1C): HEMOGLOBIN A1C: 6.4

## 2017-07-11 MED ORDER — METFORMIN HCL 500 MG PO TABS: 500.0000 mg | ORAL_TABLET | Freq: Every day | ORAL | 5 refills | Status: DC

## 2017-07-11 MED ORDER — LISINOPRIL 20 MG PO TABS: 20.0000 mg | ORAL_TABLET | Freq: Every day | ORAL | 6 refills | Status: DC

## 2017-07-11 MED ORDER — HYDROCHLOROTHIAZIDE 25 MG PO TABS: 25.0000 mg | ORAL_TABLET | Freq: Every day | ORAL | 5 refills | Status: DC

## 2017-07-11 NOTE — Progress Notes (Signed)
Patient is here for f/up  

## 2017-07-11 NOTE — Progress Notes (Signed)
Subjective:  Patient ID: Erik Nguyen, male    DOB: 09/01/1955  Age: 61 y.o. MRN: [id number]  CC: Hypertension   HPI Breyon Nguyen is a  61 year old male with a history of prediabetes (A1c 6.4 which is up from 6.0), hypertension, multiple myeloma (status post autologous stem cell transplant in 07/2016), chemotherapy-induced neuropathy, GERD who presents today for a follow-up visit.  His blood pressure is elevated and on further questioning he admits to taking just lisinopril but has not been taking his hydrochlorothiazide.  He has not been adherent with exercise or low-sodium diet either.  He complains of pedal edema which he attributes to his chemotherapy but denies shortness of breath or chest pains.  His A1c is 6.4 today and he informed me he stopped taking metformin which he was prescribed for prediabetes due to the fact that his blood sugars have been normal but review of his labs indicate his A1c has trended up from 6.0 previously.  His last visit to oncology was in 06/20/17 at which time he was thought to have achieved near complete resolution of his multiple myeloma and will continue Zometa infusions every 3 months. He denies back pain or leg pains at this time previously  Past Medical History:  Diagnosis Date  . Dyspnea    exertion   . Encounter for antineoplastic chemotherapy 02/09/2016  . GERD (gastroesophageal reflux disease)   . History of syphilis 02/24/2016  . Hypertension Dx 2016  . Multiple myeloma not having achieved remission ([facility]) 09/01/2015  . Pre-diabetes   . Priapism 2014     Past Surgical History:  Procedure Laterality Date  . COLONOSCOPY    . POLYPECTOMY      Allergies  Allergen Reactions  . Benadryl [Diphenhydramine] Other (See Comments)    priapism  . Trazodone And Nefazodone     priapism  . Latex Itching and Rash     Outpatient Medications Prior to Visit  Medication Sig Dispense Refill  . acetaminophen (TYLENOL) 500 MG tablet Take 1,000 mg  by mouth every 6 (six) hours as needed for fever.    Marland Kitchen acyclovir (ZOVIRAX) 400 MG tablet Take 1 tablet (400 mg total) by mouth 2 (two) times daily. 60 tablet 6  . aluminum-magnesium hydroxide-simethicone (MAALOX) 149-702-63 MG/5ML SUSP Take 30 mLs by mouth every 4 (four) hours as needed.    Marland Kitchen aspirin 81 MG chewable tablet Chew 81 mg by mouth daily.    Marland Kitchen CALCIUM PO Take 1 tablet by mouth daily.    . cholecalciferol (VITAMIN D) 1000 units tablet Take 1,000 Units by mouth daily.     . folic acid (FOLVITE) 1 MG tablet Take 1 tablet (1 mg total) by mouth daily. 90 tablet 1  . gabapentin (NEURONTIN) 300 MG capsule Take 1-2 capsules (300-600 mg total) by mouth 2 (two) times daily. Take 300 mg in the morning and 600 mg at night. 100 capsule 9  . lidocaine-prilocaine (EMLA) cream Apply to affected area once 30 g 3  . loperamide (IMODIUM) 2 MG capsule Take 2 mg by mouth 4 (four) times daily as needed.    . Multiple Vitamin (MULTIVITAMIN) tablet Take 1 tablet by mouth daily.  1  . ondansetron (ZOFRAN) 8 MG tablet Take 8 mg by mouth every 8 (eight) hours as needed.    . pantoprazole (PROTONIX) 40 MG tablet Take 1 tablet (40 mg total) by mouth daily. 30 tablet 6  . pomalidomide (POMALYST) 2 MG capsule Take 1 capsule (2 mg total) daily by  mouth. Take with water on days 1-21. Repeat every 28 days. 21 capsule 0  . prochlorperazine (COMPAZINE) 10 MG tablet Take 10 mg by mouth every 6 (six) hours as needed.    . ranitidine (ZANTAC) 150 MG tablet TAKE 1 TABLET BY MOUTH AT BEDTIME 30 tablet 1  . traMADol (ULTRAM) 50 MG tablet Take 1-2 tablets (50-100 mg total) by mouth every 6 (six) hours as needed. for pain 90 tablet 0  . zolpidem (AMBIEN) 10 MG tablet Take 1 tablet (10 mg total) by mouth at bedtime as needed for sleep. 30 tablet 3  . hydrochlorothiazide (HYDRODIURIL) 25 MG tablet Take 1 tablet (25 mg total) by mouth daily. 30 tablet 9  . lisinopril (PRINIVIL,ZESTRIL) 20 MG tablet Take 1 tablet (20 mg total) by  mouth daily. 30 tablet 6  . metFORMIN (GLUCOPHAGE) 500 MG tablet Take 1 tablet (500 mg total) by mouth daily with breakfast. 30 tablet 5   Facility-Administered Medications Prior to Visit  Medication Dose Route Frequency Provider Last Rate Last Dose  . sodium chloride flush (NS) 0.9 % injection 10 mL  10 mL Intracatheter PRN Alvy Bimler, Ni, MD   10 mL at 03/21/17 0924    ROS Review of Systems  Constitutional: Negative for activity change and appetite change.  HENT: Negative for sinus pressure and sore throat.   Eyes: Negative for visual disturbance.  Respiratory: Negative for cough, chest tightness and shortness of breath.   Cardiovascular: Negative for chest pain and leg swelling.  Gastrointestinal: Negative for abdominal distention, abdominal pain, constipation and diarrhea.  Endocrine: Negative.   Genitourinary: Negative for dysuria.  Musculoskeletal: Negative for joint swelling and myalgias.  Skin: Negative for rash.  Allergic/Immunologic: Negative.   Neurological: Negative for weakness, light-headedness and numbness.  Psychiatric/Behavioral: Negative for dysphoric mood and suicidal ideas.    Objective:  BP (!) 154/76 (BP Location: Left Arm, Patient Position: Sitting, Cuff Size: Normal)   Pulse 87   Temp 97.6 F (36.4 C) (Oral)   Resp 18   Ht _0  (1.676 m)   Wt 183 lb 3.2 oz (83.1 kg)   SpO2 96%   BMI 29.57 kg/m   BP/Weight 07/11/2017 06/20/2017 87/03/6766  Systolic BP 209 470 962  Diastolic BP 76 87 84  Wt. (Lbs) 183.2 177.6 -  BMI 29.57 28.67 -      Physical Exam  Constitutional: He is oriented to person, place, and time. He appears well-developed and well-nourished.  Cardiovascular: Normal rate, normal heart sounds and intact distal pulses.  No murmur heard. Pulmonary/Chest: Effort normal and breath sounds normal. He has no wheezes. He has no rales. He exhibits no tenderness.  Abdominal: Soft. Bowel sounds are normal. He exhibits no distension and no mass. There  is no tenderness.  Musculoskeletal: Normal range of motion. He exhibits no edema.  Neurological: He is alert and oriented to person, place, and time.  Skin: Skin is warm and dry.  Psychiatric: He has a normal mood and affect.     CMP Latest Ref Rng & Units 06/20/2017 06/20/2017 06/06/2017  Glucose 70 - 140 mg/dl 103 - 95  BUN 7.0 - 26.0 mg/dL 11.1 - 15.3  Creatinine 0.7 - 1.3 mg/dL 1.0 - 1.0  Sodium 136 - 145 mEq/L 142 - 143  Potassium 3.5 - 5.1 mEq/L 3.8 - 3.8  Chloride 101 - 111 mmol/L - - -  CO2 22 - 29 mEq/L 25 - 23  Calcium 8.4 - 10.4 mg/dL 9.0 - 9.1  Total Protein  6.0 - 8.5 g/dL 6.0(L) 5.6(L) 6.4  Total Bilirubin 0.20 - 1.20 mg/dL 0.34 - 0.49  Alkaline Phos 40 - 150 U/L 47 - 48  AST 5 - 34 U/L 11 - 14  ALT 0 - 55 U/L 14 - 12    Lab Results  Component Value Date   HGBA1C 6.4 07/11/2017    Assessment & Plan:   1. Prediabetes A1c of 6.4 which has trended up from 6.0 previously He has not been taking metformin which I have advised him to resume Continue diabetic diet - HgB A1c - metFORMIN (GLUCOPHAGE) 500 MG tablet; Take 1 tablet (500 mg total) by mouth daily with breakfast.  Dispense: 30 tablet; Refill: 5  2. Multiple myeloma in relapse [facility]) Status post autologous bone marrow transplant Asymptomatic at this time Near complete response to treatment as per oncology Continue Zometa every 3 months Follow-up with oncology  3. Essential hypertension Uncontrolled He has been taking only lisinopril but not hydrochlorothiazide Advised to  resume hydrochlorothiazide in addition to lisinopril Pedal edema apparent on exam; he has been advised that resumption of hydrochlorothiazide, elevation of feet, low-sodium diet will help with edema Low-sodium diet   Meds ordered this encounter  Medications  . metFORMIN (GLUCOPHAGE) 500 MG tablet    Sig: Take 1 tablet (500 mg total) by mouth daily with breakfast.    Dispense:  30 tablet    Refill:  5  . hydrochlorothiazide  (HYDRODIURIL) 25 MG tablet    Sig: Take 1 tablet (25 mg total) by mouth daily.    Dispense:  30 tablet    Refill:  5  . lisinopril (PRINIVIL,ZESTRIL) 20 MG tablet    Sig: Take 1 tablet (20 mg total) by mouth daily.    Dispense:  30 tablet    Refill:  6    Follow-up: Return in about 3 months (around 10/11/2017) for follow up on hypertension and prediabetes.   Arnoldo Morale MD

## 2017-07-18 ENCOUNTER — Encounter: Payer: Self-pay | Admitting: Hematology and Oncology

## 2017-07-18 ENCOUNTER — Other Ambulatory Visit (HOSPITAL_BASED_OUTPATIENT_CLINIC_OR_DEPARTMENT_OTHER): Payer: Medicaid Other

## 2017-07-18 ENCOUNTER — Telehealth: Payer: Self-pay

## 2017-07-18 ENCOUNTER — Ambulatory Visit (HOSPITAL_BASED_OUTPATIENT_CLINIC_OR_DEPARTMENT_OTHER): Payer: Medicaid Other

## 2017-07-18 ENCOUNTER — Ambulatory Visit (HOSPITAL_BASED_OUTPATIENT_CLINIC_OR_DEPARTMENT_OTHER): Payer: Medicaid Other | Admitting: Hematology and Oncology

## 2017-07-18 ENCOUNTER — Other Ambulatory Visit: Payer: Self-pay | Admitting: Hematology and Oncology

## 2017-07-18 ENCOUNTER — Ambulatory Visit: Payer: Medicaid Other

## 2017-07-18 VITALS — BP 122/88 | HR 96 | Temp 99.1°F | Resp 18 | Ht 66.0 in | Wt 179.2 lb

## 2017-07-18 VITALS — BP 123/77 | HR 86 | Temp 97.7°F | Resp 18

## 2017-07-18 DIAGNOSIS — C9002 Multiple myeloma in relapse: Secondary | ICD-10-CM

## 2017-07-18 DIAGNOSIS — C9001 Multiple myeloma in remission: Secondary | ICD-10-CM

## 2017-07-18 DIAGNOSIS — H259 Unspecified age-related cataract: Secondary | ICD-10-CM | POA: Diagnosis not present

## 2017-07-18 DIAGNOSIS — Z9481 Bone marrow transplant status: Secondary | ICD-10-CM

## 2017-07-18 DIAGNOSIS — Z5112 Encounter for antineoplastic immunotherapy: Secondary | ICD-10-CM | POA: Diagnosis present

## 2017-07-18 DIAGNOSIS — H269 Unspecified cataract: Secondary | ICD-10-CM | POA: Insufficient documentation

## 2017-07-18 LAB — CBC WITH DIFFERENTIAL/PLATELET
BASO%: 2.6 % — ABNORMAL HIGH (ref 0.0–2.0)
BASOS ABS: 0.2 10*3/uL — AB (ref 0.0–0.1)
EOS ABS: 0.4 10*3/uL (ref 0.0–0.5)
EOS%: 6.6 % (ref 0.0–7.0)
HEMATOCRIT: 41.1 % (ref 38.4–49.9)
HEMOGLOBIN: 14.1 g/dL (ref 13.0–17.1)
LYMPH#: 2.1 10*3/uL (ref 0.9–3.3)
LYMPH%: 35.9 % (ref 14.0–49.0)
MCH: 31.8 pg (ref 27.2–33.4)
MCHC: 34.3 g/dL (ref 32.0–36.0)
MCV: 92.6 fL (ref 79.3–98.0)
MONO#: 1 10*3/uL — ABNORMAL HIGH (ref 0.1–0.9)
MONO%: 17.5 % — ABNORMAL HIGH (ref 0.0–14.0)
NEUT#: 2.2 10*3/uL (ref 1.5–6.5)
NEUT%: 37.4 % — AB (ref 39.0–75.0)
PLATELETS: 187 10*3/uL (ref 140–400)
RBC: 4.44 10*6/uL (ref 4.20–5.82)
RDW: 15.1 % — AB (ref 11.0–14.6)
WBC: 5.8 10*3/uL (ref 4.0–10.3)

## 2017-07-18 LAB — COMPREHENSIVE METABOLIC PANEL
ALT: 15 U/L (ref 0–55)
AST: 13 U/L (ref 5–34)
Albumin: 3.9 g/dL (ref 3.5–5.0)
Alkaline Phosphatase: 49 U/L (ref 40–150)
Anion Gap: 10 mEq/L (ref 3–11)
BUN: 12.1 mg/dL (ref 7.0–26.0)
CALCIUM: 9.3 mg/dL (ref 8.4–10.4)
CHLORIDE: 107 meq/L (ref 98–109)
CO2: 24 mEq/L (ref 22–29)
Creatinine: 1.3 mg/dL (ref 0.7–1.3)
GLUCOSE: 109 mg/dL (ref 70–140)
POTASSIUM: 3.6 meq/L (ref 3.5–5.1)
SODIUM: 140 meq/L (ref 136–145)
Total Bilirubin: 0.5 mg/dL (ref 0.20–1.20)
Total Protein: 6.7 g/dL (ref 6.4–8.3)

## 2017-07-18 MED ORDER — MONTELUKAST SODIUM 10 MG PO TABS
10.0000 mg | ORAL_TABLET | Freq: Once | ORAL | Status: AC
  Administered 2017-07-18: 10 mg via ORAL

## 2017-07-18 MED ORDER — ACETAMINOPHEN 325 MG PO TABS
650.0000 mg | ORAL_TABLET | Freq: Once | ORAL | Status: AC
  Administered 2017-07-18: 650 mg via ORAL

## 2017-07-18 MED ORDER — ACETAMINOPHEN 325 MG PO TABS
ORAL_TABLET | ORAL | Status: AC
  Filled 2017-07-18: qty 2

## 2017-07-18 MED ORDER — ZOLEDRONIC ACID 4 MG/100ML IV SOLN
4.0000 mg | Freq: Once | INTRAVENOUS | Status: AC
  Administered 2017-07-18: 4 mg via INTRAVENOUS
  Filled 2017-07-18: qty 100

## 2017-07-18 MED ORDER — SODIUM CHLORIDE 0.9% FLUSH
10.0000 mL | Freq: Once | INTRAVENOUS | Status: AC
  Administered 2017-07-18: 10 mL
  Filled 2017-07-18: qty 10

## 2017-07-18 MED ORDER — PROCHLORPERAZINE MALEATE 10 MG PO TABS
ORAL_TABLET | ORAL | Status: AC
  Filled 2017-07-18: qty 1

## 2017-07-18 MED ORDER — PROCHLORPERAZINE MALEATE 10 MG PO TABS
10.0000 mg | ORAL_TABLET | Freq: Once | ORAL | Status: AC
  Administered 2017-07-18: 10 mg via ORAL

## 2017-07-18 MED ORDER — DEXAMETHASONE SODIUM PHOSPHATE 10 MG/ML IJ SOLN
10.0000 mg | Freq: Once | INTRAMUSCULAR | Status: AC
  Administered 2017-07-18: 10 mg via INTRAVENOUS

## 2017-07-18 MED ORDER — SODIUM CHLORIDE 0.9 % IV SOLN
15.8000 mg/kg | Freq: Once | INTRAVENOUS | Status: AC
  Administered 2017-07-18: 1200 mg via INTRAVENOUS
  Filled 2017-07-18: qty 60

## 2017-07-18 MED ORDER — FAMOTIDINE IN NACL 20-0.9 MG/50ML-% IV SOLN
INTRAVENOUS | Status: AC
  Filled 2017-07-18: qty 50

## 2017-07-18 MED ORDER — DEXAMETHASONE SODIUM PHOSPHATE 10 MG/ML IJ SOLN
INTRAMUSCULAR | Status: AC
  Filled 2017-07-18: qty 1

## 2017-07-18 MED ORDER — SODIUM CHLORIDE 0.9% FLUSH
10.0000 mL | INTRAVENOUS | Status: DC | PRN
  Administered 2017-07-18: 10 mL
  Filled 2017-07-18: qty 10

## 2017-07-18 MED ORDER — MONTELUKAST SODIUM 10 MG PO TABS
ORAL_TABLET | ORAL | Status: AC
  Filled 2017-07-18: qty 1

## 2017-07-18 MED ORDER — SODIUM CHLORIDE 0.9 % IV SOLN
Freq: Once | INTRAVENOUS | Status: AC
  Administered 2017-07-18: 10:00:00 via INTRAVENOUS

## 2017-07-18 MED ORDER — HEPARIN SOD (PORK) LOCK FLUSH 100 UNIT/ML IV SOLN
500.0000 [IU] | Freq: Once | INTRAVENOUS | Status: DC | PRN
  Filled 2017-07-18: qty 5

## 2017-07-18 MED ORDER — HEPARIN SOD (PORK) LOCK FLUSH 100 UNIT/ML IV SOLN
500.0000 [IU] | Freq: Once | INTRAVENOUS | Status: AC | PRN
  Administered 2017-07-18: 500 [IU]
  Filled 2017-07-18: qty 5

## 2017-07-18 MED ORDER — FAMOTIDINE IN NACL 20-0.9 MG/50ML-% IV SOLN
20.0000 mg | Freq: Once | INTRAVENOUS | Status: AC
  Administered 2017-07-18: 20 mg via INTRAVENOUS

## 2017-07-18 NOTE — Patient Instructions (Signed)
Implanted Port Home Guide An implanted port is a type of central line that is placed under the skin. Central lines are used to provide IV access when treatment or nutrition needs to be given through a person's veins. Implanted ports are used for long-term IV access. An implanted port may be placed because:  You need IV medicine that would be irritating to the small veins in your hands or arms.  You need long-term IV medicines, such as antibiotics.  You need IV nutrition for a long period.  You need frequent blood draws for lab tests.  You need dialysis.  Implanted ports are usually placed in the chest area, but they can also be placed in the upper arm, the abdomen, or the leg. An implanted port has two main parts:  Reservoir. The reservoir is round and will appear as a small, raised area under your skin. The reservoir is the part where a needle is inserted to give medicines or draw blood.  Catheter. The catheter is a thin, flexible tube that extends from the reservoir. The catheter is placed into a large vein. Medicine that is inserted into the reservoir goes into the catheter and then into the vein.  How will I care for my incision site? Do not get the incision site wet. Bathe or shower as directed by your health care provider. How is my port accessed? Special steps must be taken to access the port:  Before the port is accessed, a numbing cream can be placed on the skin. This helps numb the skin over the port site.  Your health care provider uses a sterile technique to access the port. ? Your health care provider must put on a mask and sterile gloves. ? The skin over your port is cleaned carefully with an antiseptic and allowed to dry. ? The port is gently pinched between sterile gloves, and a needle is inserted into the port.  Only "non-coring" port needles should be used to access the port. Once the port is accessed, a blood return should be checked. This helps ensure that the port  is in the vein and is not clogged.  If your port needs to remain accessed for a constant infusion, a clear (transparent) bandage will be placed over the needle site. The bandage and needle will need to be changed every week, or as directed by your health care provider.  Keep the bandage covering the needle clean and dry. Do not get it wet. Follow your health care provider's instructions on how to take a shower or bath while the port is accessed.  If your port does not need to stay accessed, no bandage is needed over the port.  What is flushing? Flushing helps keep the port from getting clogged. Follow your health care provider's instructions on how and when to flush the port. Ports are usually flushed with saline solution or a medicine called heparin. The need for flushing will depend on how the port is used.  If the port is used for intermittent medicines or blood draws, the port will need to be flushed: ? After medicines have been given. ? After blood has been drawn. ? As part of routine maintenance.  If a constant infusion is running, the port may not need to be flushed.  How long will my port stay implanted? The port can stay in for as long as your health care provider thinks it is needed. When it is time for the port to come out, surgery will be   done to remove it. The procedure is similar to the one performed when the port was put in. When should I seek immediate medical care? When you have an implanted port, you should seek immediate medical care if:  You notice a bad smell coming from the incision site.  You have swelling, redness, or drainage at the incision site.  You have more swelling or pain at the port site or the surrounding area.  You have a fever that is not controlled with medicine.  This information is not intended to replace advice given to you by your health care provider. Make sure you discuss any questions you have with your health care provider. Document  Released: 08/01/2005 Document Revised: 01/07/2016 Document Reviewed: 04/08/2013 [employer] Interactive Patient Education  2017 [employer]  

## 2017-07-18 NOTE — Progress Notes (Signed)
[place] OFFICE PROGRESS NOTE  Patient Care Team: Arnoldo Morale, MD as PCP - General (Family Medicine) Melburn Hake, Costella Hatcher, MD as Consulting Physician (Hematology and Oncology)  SUMMARY OF ONCOLOGIC HISTORY:   Multiple myeloma in relapse [facility])   08/28/2015 Bone Marrow Biopsy    Accession: [id number] biopsy showed 58% myeloma involvement. Cytogenetics was normal and FISH showed Del 13q and t(11;14).       09/07/2015 - 01/22/2016 Chemotherapy    he received 6 cycles of treatment of Velcade and Revlimid with Dex       09/12/2015 - 09/16/2015 Hospital Admission    He was admitted to the hospital with sepsis, treatment was placed on hold and he received one dose of GCSF for leukopenia      01/12/2016 Adverse Reaction    Delay treatment due to neutropenia      01/27/2016 Imaging    ECHO at [facility] showed normal EF      01/27/2016 PET scan    PET scan at [facility] showed innumerable lytic lesions throughout      01/27/2016 Procedure    PFT is within normal limits at [facility]      01/28/2016 Bone Marrow Biopsy    Bone marrow at [facility] showed persistent plasma cell myeloma in a normocellularmarrow (30%) with 20% atypical plasma cells and myeloid hyperplasia.      02/11/2016 - 06/10/2016 Chemotherapy    He is started on salvage Rx with Kyprolis, Cytoxan and dexamethasone      03/24/2016 Imaging    US venous Doppler showed no evidence of deep vein or superficial thrombosis involving the right lower extremity and left lower extremity. No evidence of Baker&'s cyst on the right or left.      05/18/2016 - 05/19/2016 Hospital Admission    He was admitted for chest pain evaluation. Cardiac work-up excluded cardiac events and CT angiogram were negative for PE. It showed esphageal thickening c/w reflux esophagitis likely cause for his chest discomfort      05/23/2016 - 05/25/2016 Hospital Admission    Patient was hospitalized for worsening shortness of breath and  worsening pain. CT angiogram chest negative for pulmonary emboli. Patient had a recent normal 2-D echo and ruled out for MI. Patient noted to be dehydrated. Patient admitted placed on IV fluids. Diuretic discontinued. Patient's gabapentin has been increased per recommendations from prior office visit from patient's oncologist to 600 mg 3 times daily      06/15/2016 Bone Marrow Biopsy    Bone marrow biopsy showed only 2% plasma cell, normal FISH and cytogenetics      07/26/2016 - 07/26/2016 Chemotherapy    He received high dose melphalan at [facility]       07/27/2016 Bone Marrow Transplant    He received autologous stem cell transplant at [facility]      08/05/2016 - 08/10/2016 Hospital Admission    He was admitted to [facility] for management of neutropenic fever and diarrhea. Cultures were negative. Stool culture came back positive for Novovirus and he was managed conservatively.      11/03/2016 Bone Marrow Biopsy    Bone Marrow Flow Cytometry - NO MONOCLONAL B CELL POPULATION IDENTIFIED. - PREDOMINANCE OF T CELLS WITH NON SPECIFIC CHANGES. - SEE NOTE. Diagnosis Comment: Analysis of the lymphoid population shows predominance of T lymphocytes expressing pan T-cell antigens but with relative abundance of CD8 positive cells and reversal of the CD4:CD8 ratio. No significant CD16/56 expression is identified. The T-cell changes  are not considered specific in this setting. B cells represent the minor population with no monoclonality or abnormal phenotype. 4% lambda light chain restricted plasma cells. FISH positive CCND1/IGH 12% of cells.       11/09/2016 PET scan    No FDG avid myeloma is identified. Numerous osseous lytic lesions as above. 2.Hypermetabolic left level III lymph node, likely reactive etiology.  3.Ancillary CT findings as above.      11/29/2016 -  Chemotherapy    He received treatment with daratumumab and Pomalyst       12/13/2016 - 12/16/2016 Hospital Admission    He was  admitted to the hospital for management of dehydration and early sepsis      01/23/2017 Procedure    Status post CT-guided bone marrow biopsy, with tissue specimen sent to pathology for complete histopathologic analysis      01/23/2017 Bone Marrow Biopsy    Bone Marrow, Aspirate,Biopsy, and Clot - SLIGHTLY HYPOCELLULAR BONE MARROW FOR AGE WITH TRILINEAGE HEMATOPOIESIS AND 1% PLASMA CELLS. - SEE COMMENT. PERIPHERAL BLOOD: - NORMOCYTIC-NORMOCHROMIC ANEMIA. - LEUKOPENIA. Diagnosis Note The bone marrow is slightly hypocellular for age with trilineage hematopoiesis and non specific myeloid changes likely related to previous treatment. The plasma cells represent 1% of all cells in the aspirate with lack of large aggregate or sheets. To further evaluate the plasma cell component, immunohistochemical stains will be performed and the results reported in an addendum. Correlation with cytogenetic and FISH studies recommended.      01/31/2017 Adverse Reaction    Treatment was delayed due to SOB      01/31/2017 Imaging    1. No evidence of pulmonary embolism. Sensitivity is moderately degraded by respiratory motion. 2. Multiple myeloma.  Stable compression fracture at T9. 3. Coronary artery calcifications       INTERVAL HISTORY: Please see below for problem oriented charting. He returns for further follow-up He is scheduled for cataract surgery tomorrow He denies new bone pain No peripheral neuropathy Denies recent infection Overall, he tolerated treatment very well  REVIEW OF SYSTEMS:   Constitutional: Denies fevers, chills or abnormal weight loss Eyes: Denies blurriness of vision Ears, nose, mouth, throat, and face: Denies mucositis or sore throat Respiratory: Denies cough, dyspnea or wheezes Cardiovascular: Denies palpitation, chest discomfort or lower extremity swelling Gastrointestinal:  Denies nausea, heartburn or change in bowel habits Skin: Denies abnormal skin  rashes Lymphatics: Denies new lymphadenopathy or easy bruising Neurological:Denies numbness, tingling or new weaknesses Behavioral/Psych: Mood is stable, no new changes  All other systems were reviewed with the patient and are negative.  I have reviewed the past medical history, past surgical history, social history and family history with the patient and they are unchanged from previous note.  ALLERGIES:  is allergic to benadryl [diphenhydramine]; trazodone and nefazodone; and latex.  MEDICATIONS:  Current Outpatient Medications  Medication Sig Dispense Refill  . acetaminophen (TYLENOL) 500 MG tablet Take 1,000 mg by mouth every 6 (six) hours as needed for fever.    Marland Kitchen acyclovir (ZOVIRAX) 400 MG tablet Take 1 tablet (400 mg total) by mouth 2 (two) times daily. 60 tablet 6  . aluminum-magnesium hydroxide-simethicone (MAALOX) 841-660-63 MG/5ML SUSP Take 30 mLs by mouth every 4 (four) hours as needed.    Marland Kitchen aspirin 81 MG chewable tablet Chew 81 mg by mouth daily.    Marland Kitchen CALCIUM PO Take 1 tablet by mouth daily.    . cholecalciferol (VITAMIN D) 1000 units tablet Take 1,000 Units by mouth daily.     Marland Kitchen  folic acid (FOLVITE) 1 MG tablet TAKE 1 TABLET BY MOUTH ONCE DAILY 90 tablet 1  . gabapentin (NEURONTIN) 300 MG capsule Take 1-2 capsules (300-600 mg total) by mouth 2 (two) times daily. Take 300 mg in the morning and 600 mg at night. 100 capsule 9  . hydrochlorothiazide (HYDRODIURIL) 25 MG tablet Take 1 tablet (25 mg total) by mouth daily. 30 tablet 5  . lidocaine-prilocaine (EMLA) cream Apply to affected area once 30 g 3  . lisinopril (PRINIVIL,ZESTRIL) 20 MG tablet Take 1 tablet (20 mg total) by mouth daily. 30 tablet 6  . loperamide (IMODIUM) 2 MG capsule Take 2 mg by mouth 4 (four) times daily as needed.    . metFORMIN (GLUCOPHAGE) 500 MG tablet Take 1 tablet (500 mg total) by mouth daily with breakfast. 30 tablet 5  . Multiple Vitamin (MULTIVITAMIN) tablet Take 1 tablet by mouth daily.  1  .  ondansetron (ZOFRAN) 8 MG tablet Take 8 mg by mouth every 8 (eight) hours as needed.    . pomalidomide (POMALYST) 2 MG capsule Take 1 capsule (2 mg total) daily by mouth. Take with water on days 1-21. Repeat every 28 days. 21 capsule 0  . prochlorperazine (COMPAZINE) 10 MG tablet Take 10 mg by mouth every 6 (six) hours as needed.    . ranitidine (ZANTAC) 150 MG tablet TAKE 1 TABLET BY MOUTH AT BEDTIME 30 tablet 1  . traMADol (ULTRAM) 50 MG tablet Take 1-2 tablets (50-100 mg total) by mouth every 6 (six) hours as needed. for pain 90 tablet 0  . zolpidem (AMBIEN) 10 MG tablet Take 1 tablet (10 mg total) by mouth at bedtime as needed for sleep. 30 tablet 3   No current facility-administered medications for this visit.    Facility-Administered Medications Ordered in Other Visits  Medication Dose Route Frequency Provider Last Rate Last Dose  . heparin lock flush 100 unit/mL  500 Units Intracatheter Once PRN Alvy Bimler, Davien Malone, MD      . sodium chloride flush (NS) 0.9 % injection 10 mL  10 mL Intracatheter PRN Alvy Bimler, Lena Fieldhouse, MD   10 mL at 03/21/17 6314    PHYSICAL EXAMINATION: ECOG PERFORMANCE STATUS: 0 - Asymptomatic  Vitals:   07/18/17 0932  BP: 122/88  Pulse: 96  Resp: 18  Temp: 99.1 F (37.3 C)  SpO2: 100%   Filed Weights   07/18/17 0932  Weight: 179 lb 3.2 oz (81.3 kg)    GENERAL:alert, no distress and comfortable SKIN: skin color, texture, turgor are normal, no rashes or significant lesions EYES: normal, Conjunctiva are pink and non-injected, sclera clear OROPHARYNX:no exudate, no erythema and lips, buccal mucosa, and tongue normal  NECK: supple, thyroid normal size, non-tender, without nodularity LYMPH:  no palpable lymphadenopathy in the cervical, axillary or inguinal LUNGS: clear to auscultation and percussion with normal breathing effort HEART: regular rate & rhythm and no murmurs and no lower extremity edema ABDOMEN:abdomen soft, non-tender and normal bowel  sounds Musculoskeletal:no cyanosis of digits and no clubbing  NEURO: alert & oriented x 3 with fluent speech, no focal motor/sensory deficits  LABORATORY DATA:  I have reviewed the data as listed    Component Value Date/Time   NA 140 07/18/2017 0909   K 3.6 07/18/2017 0909   CL 102 12/16/2016 0418   CO2 24 07/18/2017 0909   GLUCOSE 109 07/18/2017 0909   BUN 12.1 07/18/2017 0909   CREATININE 1.3 07/18/2017 0909   CALCIUM 9.3 07/18/2017 0909   PROT 6.7 07/18/2017 0909  ALBUMIN 3.9 07/18/2017 0909   AST 13 07/18/2017 0909   ALT 15 07/18/2017 0909   ALKPHOS 49 07/18/2017 0909   BILITOT 0.50 07/18/2017 0909   GFRNONAA >60 12/16/2016 0418   GFRNONAA 71 07/24/2015 1155   GFRAA >60 12/16/2016 0418   GFRAA 82 07/24/2015 1155    No results found for: SPEP, UPEP  Lab Results  Component Value Date   WBC 5.8 07/18/2017   NEUTROABS 2.2 07/18/2017   HGB 14.1 07/18/2017   HCT 41.1 07/18/2017   MCV 92.6 07/18/2017   PLT 187 07/18/2017      Chemistry      Component Value Date/Time   NA 140 07/18/2017 0909   K 3.6 07/18/2017 0909   CL 102 12/16/2016 0418   CO2 24 07/18/2017 0909   BUN 12.1 07/18/2017 0909   CREATININE 1.3 07/18/2017 0909      Component Value Date/Time   CALCIUM 9.3 07/18/2017 0909   ALKPHOS 49 07/18/2017 0909   AST 13 07/18/2017 0909   ALT 15 07/18/2017 0909   BILITOT 0.50 07/18/2017 0909      ASSESSMENT & PLAN:  Multiple myeloma in relapse ([place]) Recent myeloma panel and bone marrow biopsy confirmed that the patient has achieved complete response to treatment He has recently completed dexamethasone taper He is reminded to take calcium, vitamin D and will receive Zometa every 3 months, due today He will continue antimicrobial treatment with acyclovir and aspirin for DVT prophylaxis  S/P autologous bone marrow transplantation ([place]) Recent bone marrow biopsy showed complete remission He will continue antimicrobial treatment as prescribed He has  appointment to meet with his transplant physician next month  Cataracts, bilateral The patient is scheduled for cataract surgery tomorrow I spoke with the anesthesiologist and discussed with her I am not aware of any potential medication interaction   Orders Placed This Encounter  Procedures  . CBC with Differential/Platelet    Standing Status:   Standing    Number of Occurrences:   22    Standing Expiration Date:   07/18/2018  . Comprehensive metabolic panel    Standing Status:   Standing    Number of Occurrences:   22    Standing Expiration Date:   07/18/2018   All questions were answered. The patient knows to call the clinic with any problems, questions or concerns. No barriers to learning was detected. I spent 15 minutes counseling the patient face to face. The total time spent in the appointment was 20 minutes and more than 50% was on counseling and review of test results     Heath Lark, MD 07/18/2017 9:53 AM

## 2017-07-18 NOTE — Patient Instructions (Signed)
[facility] Discharge Instructions for Patients Receiving Chemotherapy  Today you received the following chemotherapy agents Daratumumab   To help prevent nausea and vomiting after your treatment, we encourage you to take your nausea medication as directed.   If you develop nausea and vomiting that is not controlled by your nausea medication, call the clinic.   BELOW ARE SYMPTOMS THAT SHOULD BE REPORTED IMMEDIATELY:  *FEVER GREATER THAN 100.5 F  *CHILLS WITH OR WITHOUT FEVER  NAUSEA AND VOMITING THAT IS NOT CONTROLLED WITH YOUR NAUSEA MEDICATION  *UNUSUAL SHORTNESS OF BREATH  *UNUSUAL BRUISING OR BLEEDING  TENDERNESS IN MOUTH AND THROAT WITH OR WITHOUT PRESENCE OF ULCERS  *URINARY PROBLEMS  *BOWEL PROBLEMS  UNUSUAL RASH Items with * indicate a potential emergency and should be followed up as soon as possible.  Feel free to call the clinic should you have any questions or concerns. The clinic phone number is [phone].  Please show the CHEMO ALERT CARD at check-in to the Emergency Department and triage nurse.   

## 2017-07-18 NOTE — Telephone Encounter (Signed)
Dr. Priscella Mann, Anesthesiologist at [facility] called and left message regarding patients surgery for 12/5.  Patient requested they call regarding potential interactions. Cell # [phone] or Surgery center # [phone], extension for anesthesia office #[phone].

## 2017-07-18 NOTE — Assessment & Plan Note (Signed)
Recent bone marrow biopsy showed complete remission He will continue antimicrobial treatment as prescribed He has appointment to meet with his transplant physician next month

## 2017-07-18 NOTE — Assessment & Plan Note (Addendum)
Recent myeloma panel and bone marrow biopsy confirmed that the patient has achieved complete response to treatment He has recently completed dexamethasone taper He is reminded to take calcium, vitamin D and will receive Zometa every 3 months, due today He will continue antimicrobial treatment with acyclovir and aspirin for DVT prophylaxis He had no recent dental issues

## 2017-07-18 NOTE — Assessment & Plan Note (Signed)
The patient is scheduled for cataract surgery tomorrow I spoke with the anesthesiologist and discussed with her I am not aware of any potential medication interaction

## 2017-07-20 LAB — MULTIPLE MYELOMA PANEL, SERUM
ALBUMIN SERPL ELPH-MCNC: 3.8 g/dL (ref 2.9–4.4)
Albumin/Glob SerPl: 1.5 (ref 0.7–1.7)
Alpha 1: 0.2 g/dL (ref 0.0–0.4)
Alpha2 Glob SerPl Elph-Mcnc: 0.8 g/dL (ref 0.4–1.0)
B-Globulin SerPl Elph-Mcnc: 1.1 g/dL (ref 0.7–1.3)
Gamma Glob SerPl Elph-Mcnc: 0.5 g/dL (ref 0.4–1.8)
Globulin, Total: 2.6 g/dL (ref 2.2–3.9)
IGA/IMMUNOGLOBULIN A, SERUM: 79 mg/dL (ref 61–437)
IGM (IMMUNOGLOBIN M), SRM: 23 mg/dL (ref 20–172)
IgG, Qn, Serum: 670 mg/dL — ABNORMAL LOW (ref 700–1600)
TOTAL PROTEIN: 6.4 g/dL (ref 6.0–8.5)

## 2017-07-20 LAB — KAPPA/LAMBDA LIGHT CHAINS
IG KAPPA FREE LIGHT CHAIN: 14 mg/L (ref 3.3–19.4)
IG LAMBDA FREE LIGHT CHAIN: 11 mg/L (ref 5.7–26.3)
Kappa/Lambda FluidC Ratio: 1.27 (ref 0.26–1.65)

## 2017-07-28 ENCOUNTER — Telehealth: Payer: Self-pay | Admitting: *Deleted

## 2017-07-28 NOTE — Telephone Encounter (Signed)
Pt left a message stating he went to [facility] last week and needs to set up a BMBX here.

## 2017-07-28 NOTE — Telephone Encounter (Signed)
I just spoke with Dr. Norma Fredrickson and we have decided no need bone marrow biopsy

## 2017-07-28 NOTE — Telephone Encounter (Signed)
Notified of message below. No BMBX needed

## 2017-07-31 ENCOUNTER — Other Ambulatory Visit: Payer: Self-pay | Admitting: *Deleted

## 2017-07-31 MED ORDER — POMALIDOMIDE 2 MG PO CAPS: 2.0000 mg | ORAL_CAPSULE | Freq: Every day | ORAL | 0 refills | Status: DC

## 2017-08-13 IMAGING — CR DG CHEST 2V
2 series · 2 of 2 positions shown · non-contrast
Comparison: 05/18/2016

CLINICAL DATA: Shortness of breath, multiple myeloma on
chemotherapy

EXAM:
CHEST  2 VIEW

[w chest pa]
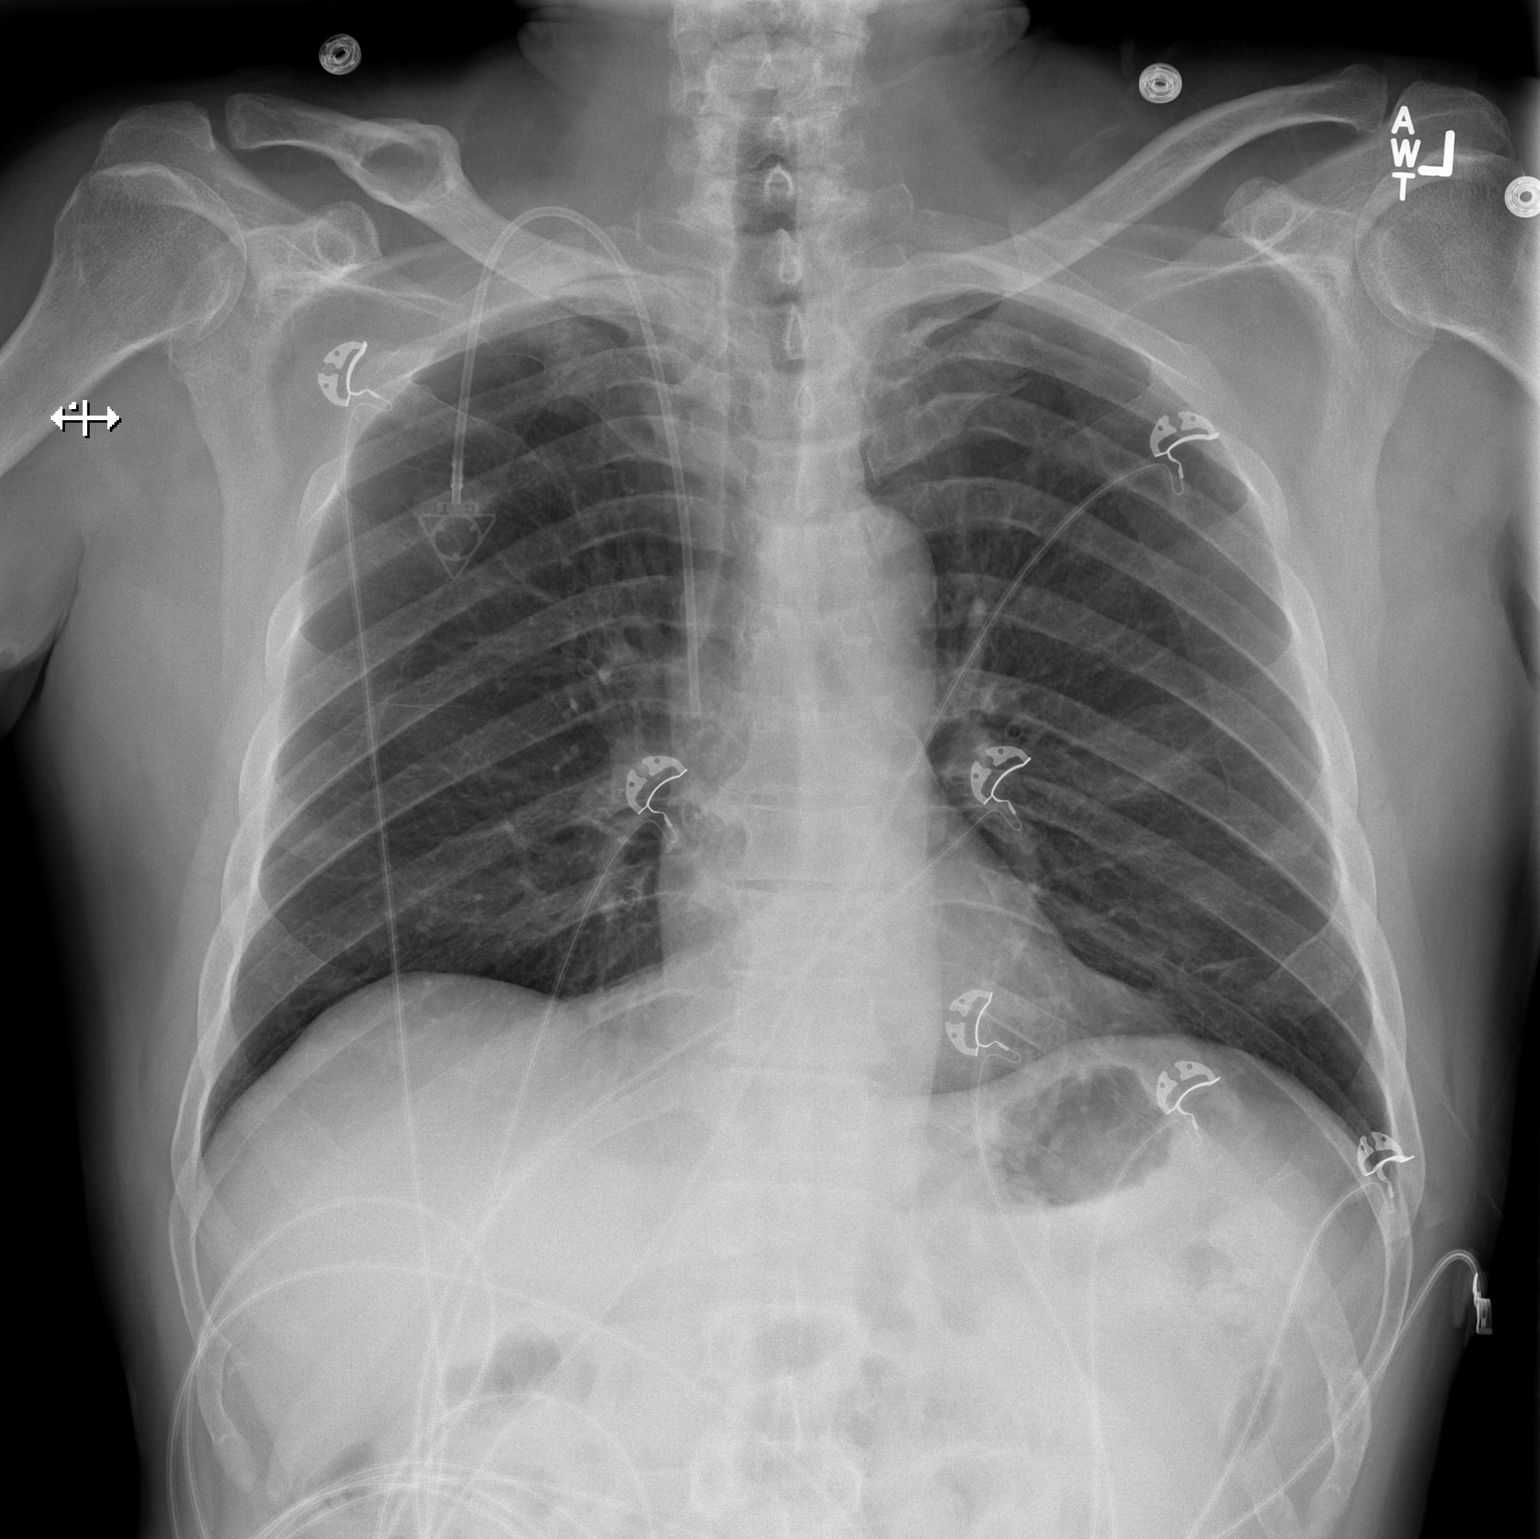

[w chest lat]
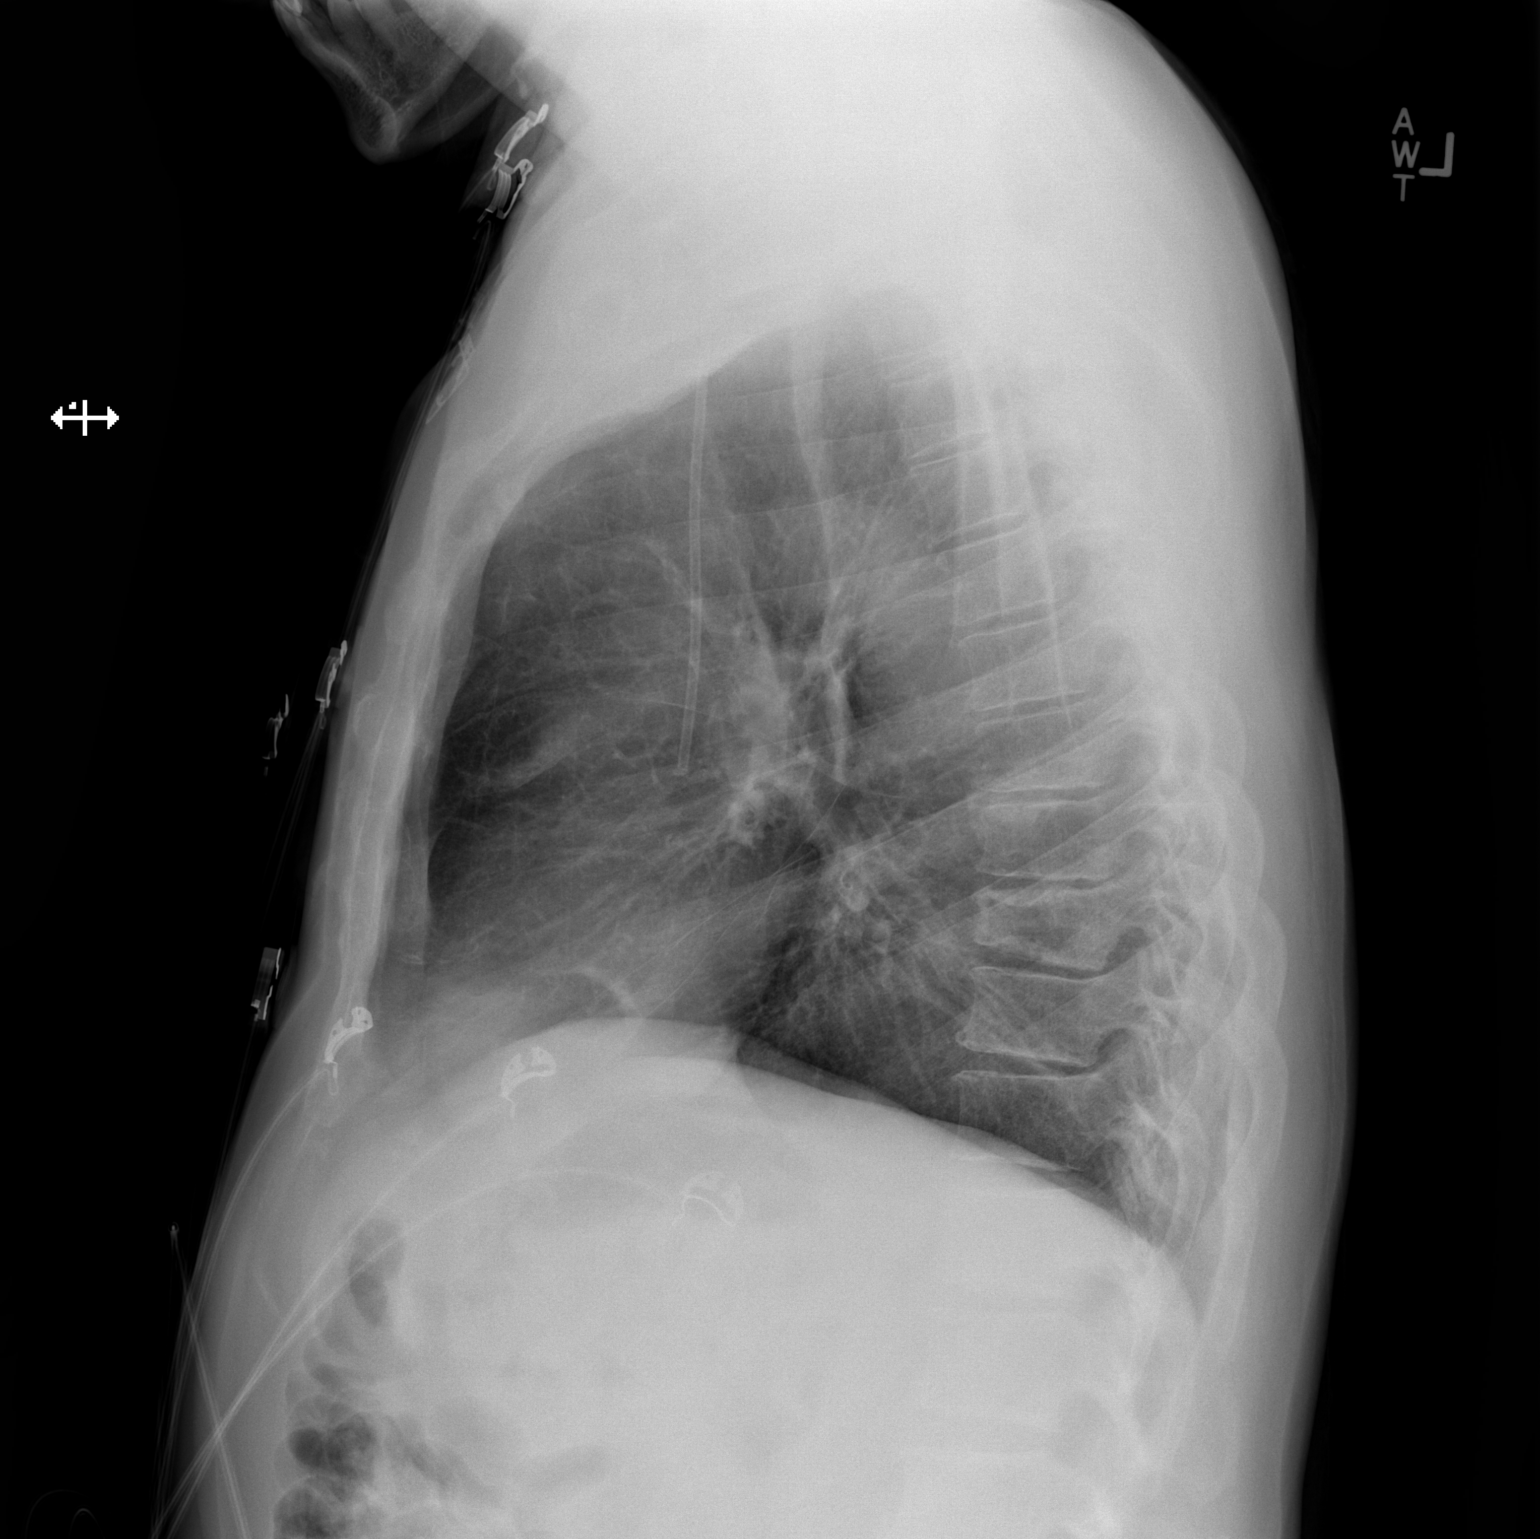

[2 of 2 positions shown; findings below may reference images not displayed]

FINDINGS: Cardiomediastinal silhouette is unremarkable. There is right IJ
Port-A-Cath with tip in distal SVC. No pneumothorax. Stable
expansile lesion in right clavicle. No infiltrate or pulmonary
edema.
IMPRESSION: No active disease.  Right IJ Port-A-Cath in place.  No pneumothorax.

## 2017-08-13 IMAGING — CT CT ANGIO CHEST
2 of 6 series · 18 of 36 positions shown · IV contrast (agent unspecified)
Comparison: Chest x-ray 05/23/2016, CT angiogram 05/18/2016

CLINICAL DATA: Shortness of breath, history of multiple myeloma

EXAM:
CT ANGIOGRAPHY CHEST WITH CONTRAST
TECHNIQUE: Multidetector CT imaging of the chest was performed using the
standard protocol during bolus administration of intravenous
contrast. Multiplanar CT image reconstructions and MIPs were
obtained to evaluate the vascular anatomy.
CONTRAST:  Chest x-ray 05/23/2016.  CT scan 05/18/2016

[Series 5: coronal mpr · coronal · 0.44mm/px · 1 of 127 slices shown]
[im 64/127  mediastinal]
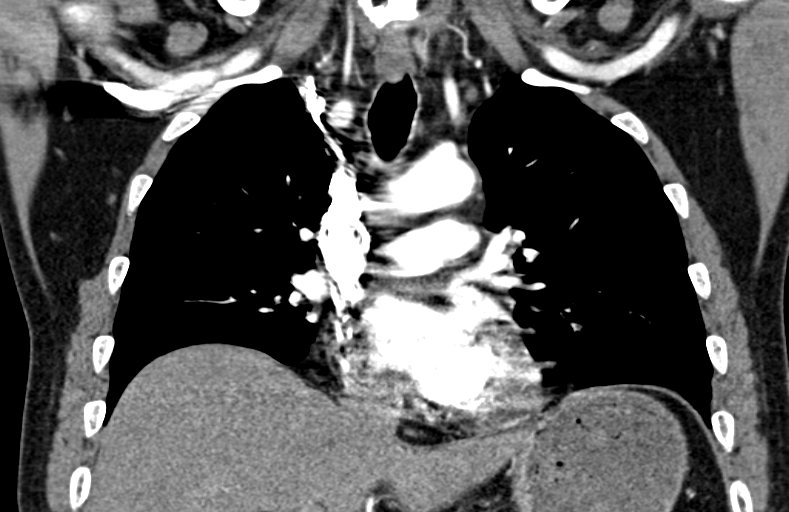

[Series 10: thins for pacs · axial · 0.80mm/px · z∈[+1866,+2070]mm · 17 of 228 slices shown]
[im 12/228  lung]
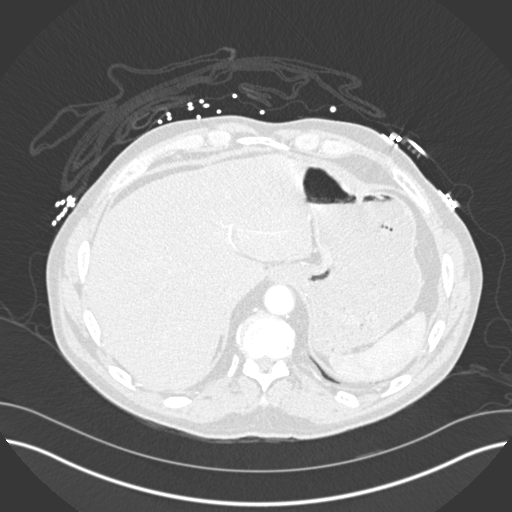
[im 23/228  mediastinal]
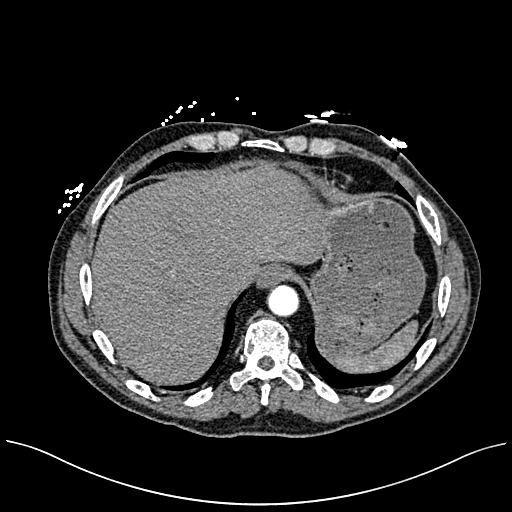
[im 35/228  lung]
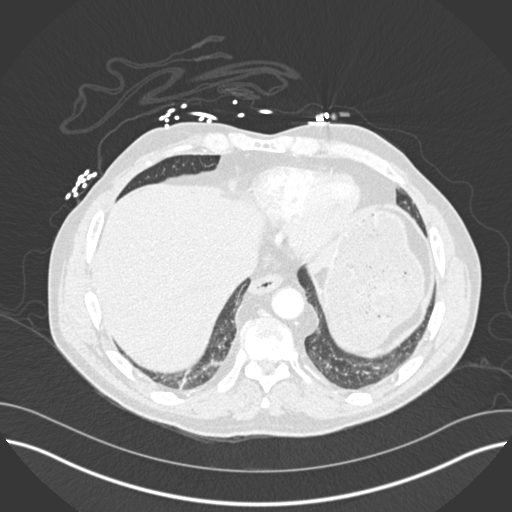
[im 46/228  mediastinal]
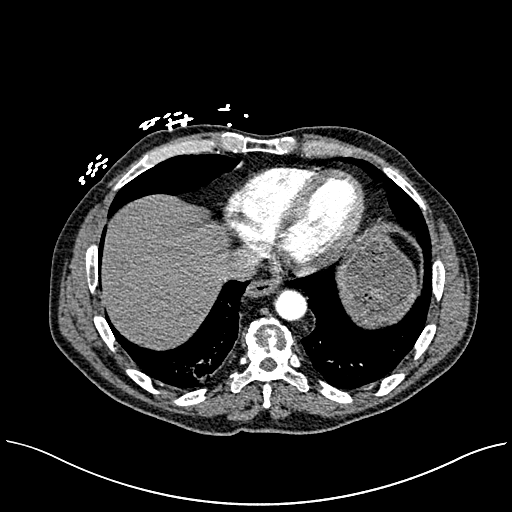
[im 69/228  lung]
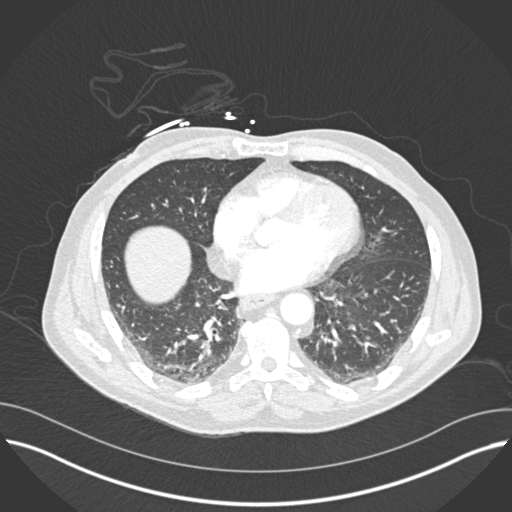
[im 80/228  mediastinal]
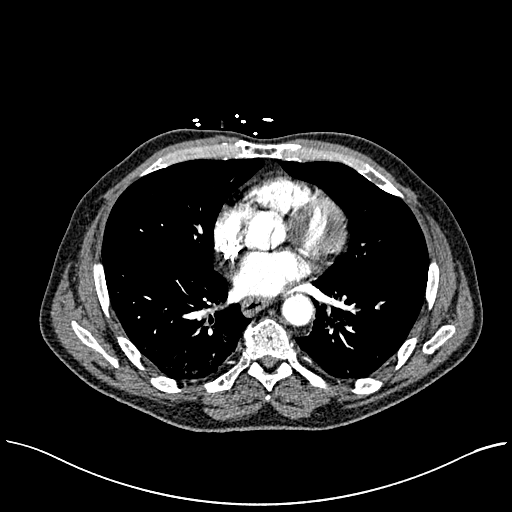
[im 91/228  lung]
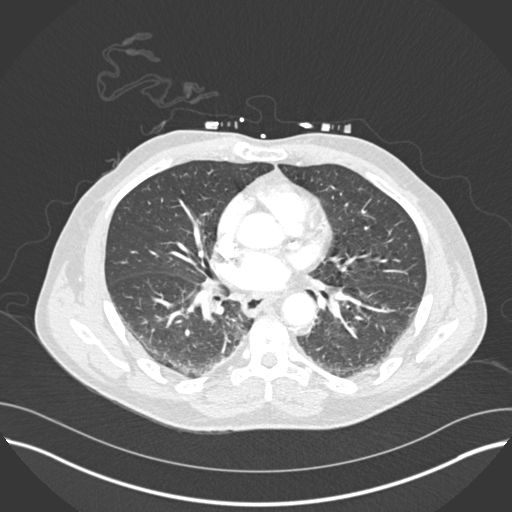
[im 103/228  mediastinal]
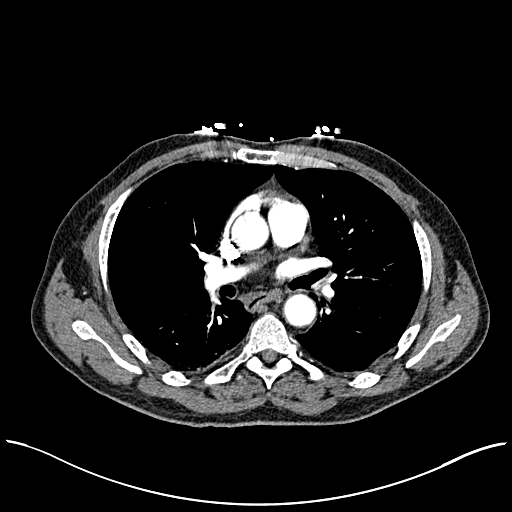
[im 114/228  lung]
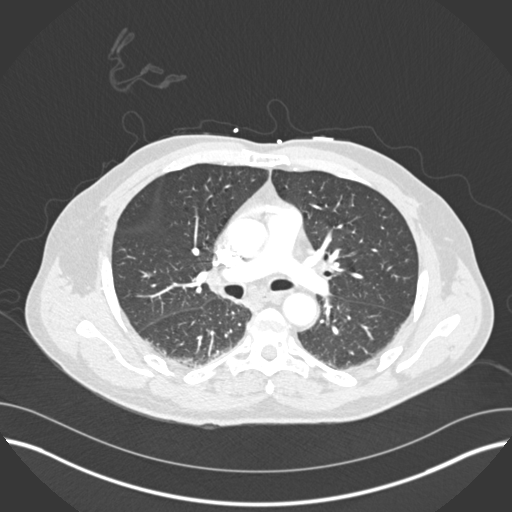
[im 125/228  mediastinal]
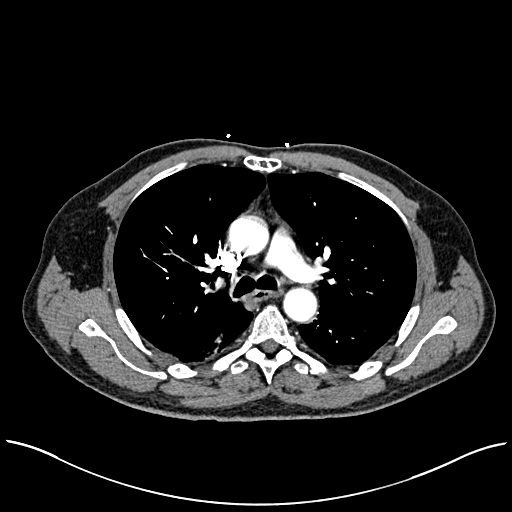
[im 137/228  lung]
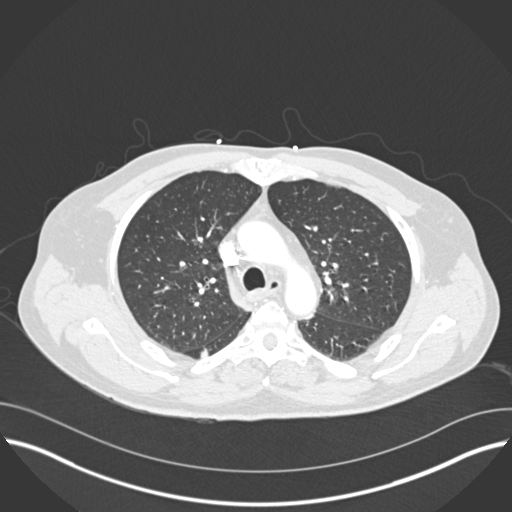
[im 148/228  mediastinal]
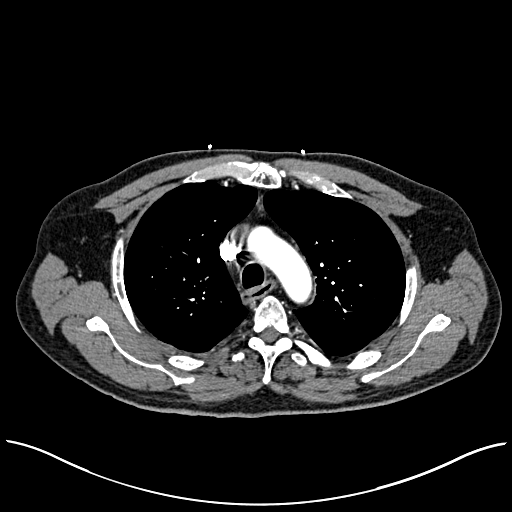
[im 159/228  lung]
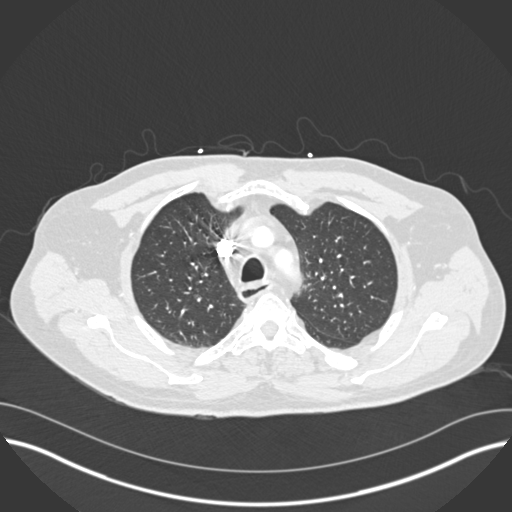
[im 182/228  mediastinal]
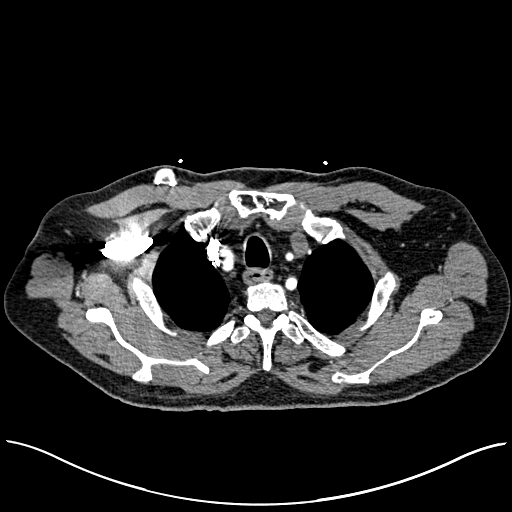
[im 193/228  lung]
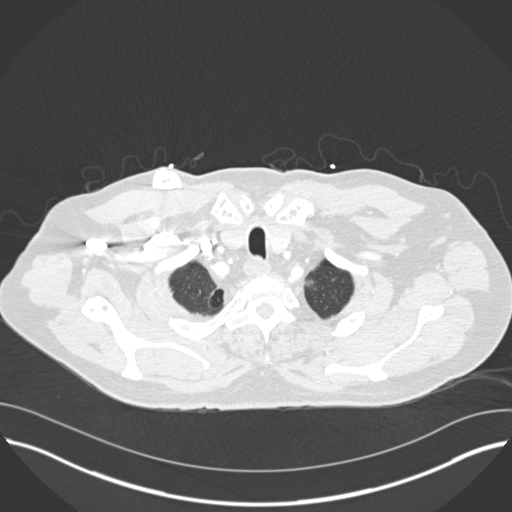
[im 205/228  mediastinal]
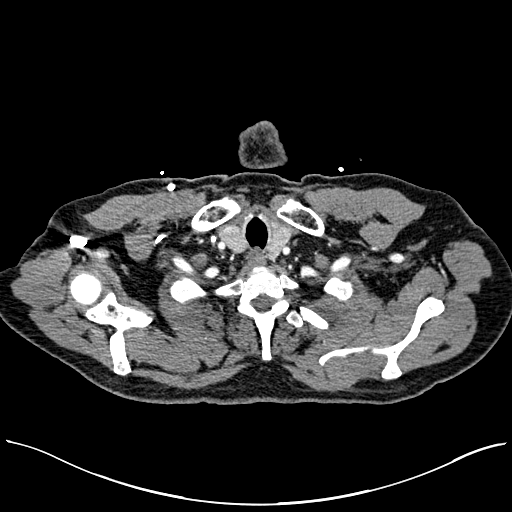
[im 216/228  lung]
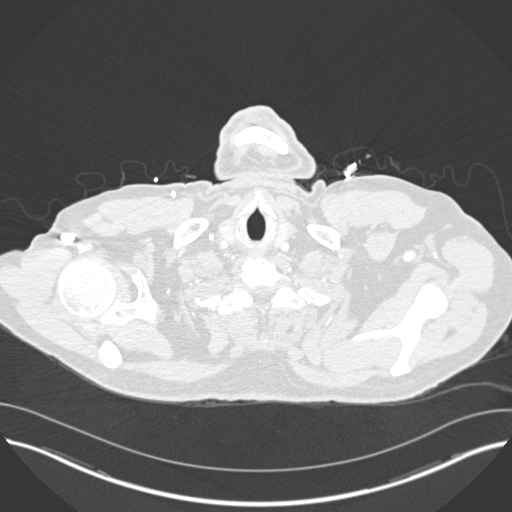

[18 of 36 positions shown; findings below may reference images not displayed]

FINDINGS: Cardiovascular: No convincing filling defects within the central or
main segmental pulmonary arteries to suggest acute embolus. Thoracic
aorta demonstrates no evidence for aneurysm or dissection. A
right-sided vascular catheter remains in place. Heart size is
nonenlarged. No pericardial effusion.

Mediastinum/Nodes: No pathologically enlarged axillary, mediastinal,
or hilar lymph nodes. The trachea and mainstem bronchi are within
normal limits. Mild air distention of the esophagus. Mild wall
thickening of the esophagus unchanged.

Lungs/Pleura: Patchy posterior dependent atelectasis. Hazy lingular
atelectasis. No acute infiltrate or effusion. No pulmonary nodules.
No pneumothorax.

Upper Abdomen: No acute finding

Musculoskeletal: Again visualized are multiple lucent lesions
involving the spine, bilateral ribs, sternum, and right clavicle.
Pathologic fracture at T9 again noted.

Review of the MIP images confirms the above findings.
IMPRESSION: 1. No definite CT evidence for acute pulmonary embolus. No CT
evidence for aortic dissection.
2. No acute pulmonary infiltrate or effusion.
3. Numerous lucent lesions involving the spine, sternum, and
bilateral ribs compatible with history of multiple myeloma.
Pathologic fracture at T9 as previously reported.

## 2017-08-22 ENCOUNTER — Inpatient Hospital Stay: Payer: Medicaid Other

## 2017-08-22 ENCOUNTER — Telehealth: Payer: Self-pay | Admitting: Hematology and Oncology

## 2017-08-22 ENCOUNTER — Other Ambulatory Visit: Payer: Self-pay | Admitting: *Deleted

## 2017-08-22 ENCOUNTER — Inpatient Hospital Stay (HOSPITAL_BASED_OUTPATIENT_CLINIC_OR_DEPARTMENT_OTHER): Payer: Medicaid Other | Admitting: Hematology and Oncology

## 2017-08-22 ENCOUNTER — Inpatient Hospital Stay: Payer: Medicaid Other | Attending: Hematology and Oncology

## 2017-08-22 VITALS — BP 144/96 | HR 82 | Temp 98.0°F | Resp 16

## 2017-08-22 DIAGNOSIS — Z5112 Encounter for antineoplastic immunotherapy: Secondary | ICD-10-CM | POA: Diagnosis present

## 2017-08-22 DIAGNOSIS — G893 Neoplasm related pain (acute) (chronic): Secondary | ICD-10-CM | POA: Insufficient documentation

## 2017-08-22 DIAGNOSIS — C9002 Multiple myeloma in relapse: Secondary | ICD-10-CM | POA: Insufficient documentation

## 2017-08-22 DIAGNOSIS — Z9481 Bone marrow transplant status: Secondary | ICD-10-CM

## 2017-08-22 LAB — COMPREHENSIVE METABOLIC PANEL
ALBUMIN: 3.7 g/dL (ref 3.5–5.0)
ALK PHOS: 47 U/L (ref 40–150)
ALT: 21 U/L (ref 0–55)
AST: 15 U/L (ref 5–34)
Anion gap: 8 (ref 3–11)
BUN: 7 mg/dL (ref 7–26)
CO2: 23 mmol/L (ref 22–29)
CREATININE: 1.19 mg/dL (ref 0.70–1.30)
Calcium: 9 mg/dL (ref 8.4–10.4)
Chloride: 111 mmol/L — ABNORMAL HIGH (ref 98–109)
GFR calc Af Amer: >60 mL/min (ref >60)
Glucose, Bld: 110 mg/dL (ref 70–140)
POTASSIUM: 3.6 mmol/L (ref 3.5–5.1)
Sodium: 142 mmol/L (ref 136–145)
Total Bilirubin: 0.6 mg/dL (ref 0.2–1.2)
Total Protein: 6.2 g/dL — ABNORMAL LOW (ref 6.4–8.3)

## 2017-08-22 LAB — CBC WITH DIFFERENTIAL/PLATELET
Abs Granulocyte: 1.1 10*3/uL — ABNORMAL LOW (ref 1.5–6.5)
Basophils Absolute: 0.1 10*3/uL (ref 0.0–0.1)
Basophils Relative: 1 %
Eosinophils Absolute: 0.6 10*3/uL — ABNORMAL HIGH (ref 0.0–0.5)
Eosinophils Relative: 14 %
HEMATOCRIT: 38.9 % (ref 38.4–49.9)
HEMOGLOBIN: 13.1 g/dL (ref 13.0–17.1)
LYMPHS ABS: 1.7 10*3/uL (ref 0.9–3.3)
LYMPHS PCT: 40 %
MCH: 31.6 pg (ref 27.2–33.4)
MCHC: 33.5 g/dL (ref 32.0–36.0)
MCV: 94.3 fL (ref 79.3–98.0)
Monocytes Absolute: 0.8 10*3/uL (ref 0.1–0.9)
Monocytes Relative: 19 %
NEUTROS ABS: 1.1 10*3/uL — AB (ref 1.5–6.5)
NEUTROS PCT: 26 %
Platelets: 162 10*3/uL (ref 140–400)
RBC: 4.13 MIL/uL — ABNORMAL LOW (ref 4.20–5.82)
RDW: 17.3 % — ABNORMAL HIGH (ref 11.0–15.6)
WBC: 4.2 10*3/uL (ref 4.0–10.3)

## 2017-08-22 MED ORDER — DEXAMETHASONE SODIUM PHOSPHATE 10 MG/ML IJ SOLN
INTRAMUSCULAR | Status: AC
  Filled 2017-08-22: qty 1

## 2017-08-22 MED ORDER — MONTELUKAST SODIUM 10 MG PO TABS
ORAL_TABLET | ORAL | Status: AC
  Filled 2017-08-22: qty 1

## 2017-08-22 MED ORDER — SODIUM CHLORIDE 0.9% FLUSH
10.0000 mL | INTRAVENOUS | Status: DC | PRN
  Administered 2017-08-22: 10 mL
  Filled 2017-08-22: qty 10

## 2017-08-22 MED ORDER — ACETAMINOPHEN 325 MG PO TABS
ORAL_TABLET | ORAL | Status: AC
  Filled 2017-08-22: qty 2

## 2017-08-22 MED ORDER — PROCHLORPERAZINE MALEATE 10 MG PO TABS
10.0000 mg | ORAL_TABLET | Freq: Once | ORAL | Status: AC
  Administered 2017-08-22: 10 mg via ORAL

## 2017-08-22 MED ORDER — HEPARIN SOD (PORK) LOCK FLUSH 100 UNIT/ML IV SOLN
500.0000 [IU] | Freq: Once | INTRAVENOUS | Status: AC | PRN
  Administered 2017-08-22: 500 [IU]
  Filled 2017-08-22: qty 5

## 2017-08-22 MED ORDER — ACETAMINOPHEN 325 MG PO TABS
650.0000 mg | ORAL_TABLET | Freq: Once | ORAL | Status: AC
  Administered 2017-08-22: 650 mg via ORAL

## 2017-08-22 MED ORDER — SODIUM CHLORIDE 0.9% FLUSH
10.0000 mL | Freq: Once | INTRAVENOUS | Status: AC
  Administered 2017-08-22: 10 mL
  Filled 2017-08-22: qty 10

## 2017-08-22 MED ORDER — FAMOTIDINE IN NACL 20-0.9 MG/50ML-% IV SOLN
20.0000 mg | Freq: Once | INTRAVENOUS | Status: AC
  Administered 2017-08-22: 20 mg via INTRAVENOUS

## 2017-08-22 MED ORDER — SODIUM CHLORIDE 0.9 % IV SOLN
Freq: Once | INTRAVENOUS | Status: AC
  Administered 2017-08-22: 11:00:00 via INTRAVENOUS

## 2017-08-22 MED ORDER — POMALIDOMIDE 2 MG PO CAPS: 2.0000 mg | ORAL_CAPSULE | Freq: Every day | ORAL | 0 refills | Status: DC

## 2017-08-22 MED ORDER — SODIUM CHLORIDE 0.9 % IV SOLN
15.8000 mg/kg | Freq: Once | INTRAVENOUS | Status: AC
  Administered 2017-08-22: 1200 mg via INTRAVENOUS
  Filled 2017-08-22: qty 60

## 2017-08-22 MED ORDER — PROCHLORPERAZINE MALEATE 10 MG PO TABS
ORAL_TABLET | ORAL | Status: AC
  Filled 2017-08-22: qty 1

## 2017-08-22 MED ORDER — MONTELUKAST SODIUM 10 MG PO TABS
10.0000 mg | ORAL_TABLET | Freq: Once | ORAL | Status: AC
  Administered 2017-08-22: 10 mg via ORAL

## 2017-08-22 MED ORDER — DEXAMETHASONE SODIUM PHOSPHATE 10 MG/ML IJ SOLN
10.0000 mg | Freq: Once | INTRAMUSCULAR | Status: AC
  Administered 2017-08-22: 10 mg via INTRAVENOUS

## 2017-08-22 MED ORDER — FAMOTIDINE IN NACL 20-0.9 MG/50ML-% IV SOLN
INTRAVENOUS | Status: AC
  Filled 2017-08-22: qty 50

## 2017-08-22 NOTE — Telephone Encounter (Signed)
Patient scheduled per 1/8 los. Patient will receive updated schedule in treatment area.

## 2017-08-22 NOTE — Patient Instructions (Signed)
[facility] Discharge Instructions for Patients Receiving Chemotherapy  Today you received the following chemotherapy agents: Daratumumab (Darzalex)   To help prevent nausea and vomiting after your treatment, we encourage you to take your nausea medication  as prescribed.    If you develop nausea and vomiting that is not controlled by your nausea medication, call the clinic.   BELOW ARE SYMPTOMS THAT SHOULD BE REPORTED IMMEDIATELY:  *FEVER GREATER THAN 100.5 F  *CHILLS WITH OR WITHOUT FEVER  NAUSEA AND VOMITING THAT IS NOT CONTROLLED WITH YOUR NAUSEA MEDICATION  *UNUSUAL SHORTNESS OF BREATH  *UNUSUAL BRUISING OR BLEEDING  TENDERNESS IN MOUTH AND THROAT WITH OR WITHOUT PRESENCE OF ULCERS  *URINARY PROBLEMS  *BOWEL PROBLEMS  UNUSUAL RASH Items with * indicate a potential emergency and should be followed up as soon as possible.  Feel free to call the clinic should you have any questions or concerns. The clinic phone number is [phone].  Please show the CHEMO ALERT CARD at check-in to the Emergency Department and triage nurse.   

## 2017-08-24 ENCOUNTER — Encounter: Payer: Self-pay | Admitting: Hematology and Oncology

## 2017-08-24 LAB — MULTIPLE MYELOMA PANEL, SERUM
ALBUMIN SERPL ELPH-MCNC: 3.5 g/dL (ref 2.9–4.4)
ALPHA2 GLOB SERPL ELPH-MCNC: 0.7 g/dL (ref 0.4–1.0)
Albumin/Glob SerPl: 1.5 (ref 0.7–1.7)
Alpha 1: 0.2 g/dL (ref 0.0–0.4)
B-Globulin SerPl Elph-Mcnc: 1 g/dL (ref 0.7–1.3)
GAMMA GLOB SERPL ELPH-MCNC: 0.6 g/dL (ref 0.4–1.8)
GLOBULIN, TOTAL: 2.5 g/dL (ref 2.2–3.9)
IGG (IMMUNOGLOBIN G), SERUM: 576 mg/dL — AB (ref 700–1600)
IgA: 72 mg/dL (ref 61–437)
IgM (Immunoglobulin M), Srm: 17 mg/dL — ABNORMAL LOW (ref 20–172)
Total Protein ELP: 6 g/dL (ref 6.0–8.5)

## 2017-08-24 LAB — KAPPA/LAMBDA LIGHT CHAINS
Kappa free light chain: 10.4 mg/L (ref 3.3–19.4)
Kappa, lambda light chain ratio: 0.81 (ref 0.26–1.65)
Lambda free light chains: 12.8 mg/L (ref 5.7–26.3)

## 2017-08-24 NOTE — Assessment & Plan Note (Signed)
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 We'll continue the same He is on calcium and vitamin D supplement

## 2017-08-24 NOTE — Progress Notes (Signed)
[place] OFFICE PROGRESS NOTE  Patient Care Team: Arnoldo Morale, MD as PCP - General (Family Medicine) Melburn Hake, Costella Hatcher, MD as Consulting Physician (Hematology and Oncology)  SUMMARY OF ONCOLOGIC HISTORY:   Multiple myeloma in relapse [facility])   08/28/2015 Bone Marrow Biopsy    Accession: [id number] biopsy showed 58% myeloma involvement. Cytogenetics was normal and FISH showed Del 13q and t(11;14).       09/07/2015 - 01/22/2016 Chemotherapy    he received 6 cycles of treatment of Velcade and Revlimid with Dex       09/12/2015 - 09/16/2015 Hospital Admission    He was admitted to the hospital with sepsis, treatment was placed on hold and he received one dose of GCSF for leukopenia      01/12/2016 Adverse Reaction    Delay treatment due to neutropenia      01/27/2016 Imaging    ECHO at [facility] showed normal EF      01/27/2016 PET scan    PET scan at [facility] showed innumerable lytic lesions throughout      01/27/2016 Procedure    PFT is within normal limits at [facility]      01/28/2016 Bone Marrow Biopsy    Bone marrow at [facility] showed persistent plasma cell myeloma in a normocellularmarrow (30%) with 20% atypical plasma cells and myeloid hyperplasia.      02/11/2016 - 06/10/2016 Chemotherapy    He is started on salvage Rx with Kyprolis, Cytoxan and dexamethasone      03/24/2016 Imaging    US venous Doppler showed no evidence of deep vein or superficial thrombosis involving the right lower extremity and left lower extremity. No evidence of Baker&'s cyst on the right or left.      05/18/2016 - 05/19/2016 Hospital Admission    He was admitted for chest pain evaluation. Cardiac work-up excluded cardiac events and CT angiogram were negative for PE. It showed esphageal thickening c/w reflux esophagitis likely cause for his chest discomfort      05/23/2016 - 05/25/2016 Hospital Admission    Patient was hospitalized for worsening shortness of breath and  worsening pain. CT angiogram chest negative for pulmonary emboli. Patient had a recent normal 2-D echo and ruled out for MI. Patient noted to be dehydrated. Patient admitted placed on IV fluids. Diuretic discontinued. Patient's gabapentin has been increased per recommendations from prior office visit from patient's oncologist to 600 mg 3 times daily      06/15/2016 Bone Marrow Biopsy    Bone marrow biopsy showed only 2% plasma cell, normal FISH and cytogenetics      07/26/2016 - 07/26/2016 Chemotherapy    He received high dose melphalan at [facility]       07/27/2016 Bone Marrow Transplant    He received autologous stem cell transplant at [facility]      08/05/2016 - 08/10/2016 Hospital Admission    He was admitted to [facility] for management of neutropenic fever and diarrhea. Cultures were negative. Stool culture came back positive for Novovirus and he was managed conservatively.      11/03/2016 Bone Marrow Biopsy    Bone Marrow Flow Cytometry - NO MONOCLONAL B CELL POPULATION IDENTIFIED. - PREDOMINANCE OF T CELLS WITH NON SPECIFIC CHANGES. - SEE NOTE. Diagnosis Comment: Analysis of the lymphoid population shows predominance of T lymphocytes expressing pan T-cell antigens but with relative abundance of CD8 positive cells and reversal of the CD4:CD8 ratio. No significant CD16/56 expression is identified. The T-cell changes  are not considered specific in this setting. B cells represent the minor population with no monoclonality or abnormal phenotype. 4% lambda light chain restricted plasma cells. FISH positive CCND1/IGH 12% of cells.       11/09/2016 PET scan    No FDG avid myeloma is identified. Numerous osseous lytic lesions as above. 2.Hypermetabolic left level III lymph node, likely reactive etiology.  3.Ancillary CT findings as above.      11/29/2016 -  Chemotherapy    He received treatment with daratumumab and Pomalyst       12/13/2016 - 12/16/2016 Hospital Admission    He was  admitted to the hospital for management of dehydration and early sepsis      01/23/2017 Procedure    Status post CT-guided bone marrow biopsy, with tissue specimen sent to pathology for complete histopathologic analysis      01/23/2017 Bone Marrow Biopsy    Bone Marrow, Aspirate,Biopsy, and Clot - SLIGHTLY HYPOCELLULAR BONE MARROW FOR AGE WITH TRILINEAGE HEMATOPOIESIS AND 1% PLASMA CELLS. - SEE COMMENT. PERIPHERAL BLOOD: - NORMOCYTIC-NORMOCHROMIC ANEMIA. - LEUKOPENIA. Diagnosis Note The bone marrow is slightly hypocellular for age with trilineage hematopoiesis and non specific myeloid changes likely related to previous treatment. The plasma cells represent 1% of all cells in the aspirate with lack of large aggregate or sheets. To further evaluate the plasma cell component, immunohistochemical stains will be performed and the results reported in an addendum. Correlation with cytogenetic and FISH studies recommended.      01/31/2017 Adverse Reaction    Treatment was delayed due to SOB      01/31/2017 Imaging    1. No evidence of pulmonary embolism. Sensitivity is moderately degraded by respiratory motion. 2. Multiple myeloma.  Stable compression fracture at T9. 3. Coronary artery calcifications       INTERVAL HISTORY: Please see below for problem oriented charting. He returns for further follow-up and chemotherapy He is doing well He denies recent infection No new bone pain He continues to take tramadol as needed Denies recent dental issues He is compliant taking all his medications as prescribed  REVIEW OF SYSTEMS:   Constitutional: Denies fevers, chills or abnormal weight loss Eyes: Denies blurriness of vision Ears, nose, mouth, throat, and face: Denies mucositis or sore throat Respiratory: Denies cough, dyspnea or wheezes Cardiovascular: Denies palpitation, chest discomfort or lower extremity swelling Gastrointestinal:  Denies nausea, heartburn or change in bowel  habits Skin: Denies abnormal skin rashes Lymphatics: Denies new lymphadenopathy or easy bruising Neurological:Denies numbness, tingling or new weaknesses Behavioral/Psych: Mood is stable, no new changes  All other systems were reviewed with the patient and are negative.  I have reviewed the past medical history, past surgical history, social history and family history with the patient and they are unchanged from previous note.  ALLERGIES:  is allergic to benadryl [diphenhydramine]; trazodone and nefazodone; and latex.  MEDICATIONS:  Current Outpatient Medications  Medication Sig Dispense Refill  . acetaminophen (TYLENOL) 500 MG tablet Take 1,000 mg by mouth every 6 (six) hours as needed for fever.    Marland Kitchen acyclovir (ZOVIRAX) 400 MG tablet Take 1 tablet (400 mg total) by mouth 2 (two) times daily. 60 tablet 6  . aluminum-magnesium hydroxide-simethicone (MAALOX) 078-675-44 MG/5ML SUSP Take 30 mLs by mouth every 4 (four) hours as needed.    Marland Kitchen aspirin 81 MG chewable tablet Chew 81 mg by mouth daily.    Marland Kitchen CALCIUM PO Take 1 tablet by mouth daily.    . cholecalciferol (VITAMIN D) 1000  units tablet Take 1,000 Units by mouth daily.     . folic acid (FOLVITE) 1 MG tablet TAKE 1 TABLET BY MOUTH ONCE DAILY 90 tablet 1  . gabapentin (NEURONTIN) 300 MG capsule Take 1-2 capsules (300-600 mg total) by mouth 2 (two) times daily. Take 300 mg in the morning and 600 mg at night. 100 capsule 9  . lidocaine-prilocaine (EMLA) cream Apply to affected area once 30 g 3  . lisinopril (PRINIVIL,ZESTRIL) 20 MG tablet Take 1 tablet (20 mg total) by mouth daily. 30 tablet 6  . loperamide (IMODIUM) 2 MG capsule Take 2 mg by mouth 4 (four) times daily as needed.    . Multiple Vitamin (MULTIVITAMIN) tablet Take 1 tablet by mouth daily.  1  . ondansetron (ZOFRAN) 8 MG tablet Take 8 mg by mouth every 8 (eight) hours as needed.    . pomalidomide (POMALYST) 2 MG capsule Take 1 capsule (2 mg total) by mouth daily. Take with water  on days 1-21. Repeat every 28 days. 21 capsule 0  . prochlorperazine (COMPAZINE) 10 MG tablet Take 10 mg by mouth every 6 (six) hours as needed.    . ranitidine (ZANTAC) 150 MG tablet TAKE 1 TABLET BY MOUTH AT BEDTIME 30 tablet 1  . traMADol (ULTRAM) 50 MG tablet Take 1-2 tablets (50-100 mg total) by mouth every 6 (six) hours as needed. for pain 90 tablet 0  . zolpidem (AMBIEN) 10 MG tablet Take 1 tablet (10 mg total) by mouth at bedtime as needed for sleep. 30 tablet 3   No current facility-administered medications for this visit.    Facility-Administered Medications Ordered in Other Visits  Medication Dose Route Frequency Provider Last Rate Last Dose  . heparin lock flush 100 unit/mL  500 Units Intracatheter Once PRN Alvy Bimler, Nesta Scaturro, MD      . sodium chloride flush (NS) 0.9 % injection 10 mL  10 mL Intracatheter PRN Alvy Bimler, Kiante Ciavarella, MD   10 mL at 03/21/17 0924    PHYSICAL EXAMINATION: ECOG PERFORMANCE STATUS: 1 - Symptomatic but completely ambulatory  Vitals:   08/22/17 1006  BP: (!) 140/98  Pulse: 90  Resp: 18  Temp: 98.3 F (36.8 C)  SpO2: 100%   Filed Weights   08/22/17 1006  Weight: 182 lb 11.2 oz (82.9 kg)    GENERAL:alert, no distress and comfortable SKIN: skin color, texture, turgor are normal, no rashes or significant lesions EYES: normal, Conjunctiva are pink and non-injected, sclera clear OROPHARYNX:no exudate, no erythema and lips, buccal mucosa, and tongue normal  NECK: supple, thyroid normal size, non-tender, without nodularity LYMPH:  no palpable lymphadenopathy in the cervical, axillary or inguinal LUNGS: clear to auscultation and percussion with normal breathing effort HEART: regular rate & rhythm and no murmurs and no lower extremity edema ABDOMEN:abdomen soft, non-tender and normal bowel sounds Musculoskeletal:no cyanosis of digits and no clubbing  NEURO: alert & oriented x 3 with fluent speech, no focal motor/sensory deficits  LABORATORY DATA:  I have reviewed  the data as listed    Component Value Date/Time   NA 142 08/22/2017 0928   NA 140 07/18/2017 0909   K 3.6 08/22/2017 0928   K 3.6 07/18/2017 0909   CL 111 (H) 08/22/2017 0928   CO2 23 08/22/2017 0928   CO2 24 07/18/2017 0909   GLUCOSE 110 08/22/2017 0928   GLUCOSE 109 07/18/2017 0909   BUN 7 08/22/2017 0928   BUN 12.1 07/18/2017 0909   CREATININE 1.19 08/22/2017 0928   CREATININE 1.3  07/18/2017 0909   CALCIUM 9.0 08/22/2017 0928   CALCIUM 9.3 07/18/2017 0909   PROT 6.2 (L) 08/22/2017 0928   PROT 6.4 07/18/2017 0909   PROT 6.7 07/18/2017 0909   ALBUMIN 3.7 08/22/2017 0928   ALBUMIN 3.9 07/18/2017 0909   AST 15 08/22/2017 0928   AST 13 07/18/2017 0909   ALT 21 08/22/2017 0928   ALT 15 07/18/2017 0909   ALKPHOS 47 08/22/2017 0928   ALKPHOS 49 07/18/2017 0909   BILITOT 0.6 08/22/2017 0928   BILITOT 0.50 07/18/2017 0909   GFRNONAA >60 08/22/2017 0928   GFRNONAA 71 07/24/2015 1155   GFRAA >60 08/22/2017 0928   GFRAA 82 07/24/2015 1155    No results found for: SPEP, UPEP  Lab Results  Component Value Date   WBC 4.2 08/22/2017   NEUTROABS 1.1 (L) 08/22/2017   HGB 13.1 08/22/2017   HCT 38.9 08/22/2017   MCV 94.3 08/22/2017   PLT 162 08/22/2017      Chemistry      Component Value Date/Time   NA 142 08/22/2017 0928   NA 140 07/18/2017 0909   K 3.6 08/22/2017 0928   K 3.6 07/18/2017 0909   CL 111 (H) 08/22/2017 0928   CO2 23 08/22/2017 0928   CO2 24 07/18/2017 0909   BUN 7 08/22/2017 0928   BUN 12.1 07/18/2017 0909   CREATININE 1.19 08/22/2017 0928   CREATININE 1.3 07/18/2017 0909      Component Value Date/Time   CALCIUM 9.0 08/22/2017 0928   CALCIUM 9.3 07/18/2017 0909   ALKPHOS 47 08/22/2017 0928   ALKPHOS 49 07/18/2017 0909   AST 15 08/22/2017 0928   AST 13 07/18/2017 0909   ALT 21 08/22/2017 0928   ALT 15 07/18/2017 0909   BILITOT 0.6 08/22/2017 0928   BILITOT 0.50 07/18/2017 0909      ASSESSMENT & PLAN:  Multiple myeloma in relapse ([place]) Recent  myeloma panel and bone marrow biopsy confirmed that the patient has achieved complete response to treatment He will continue monthly daratumumab with Pomalyst He has recently completed dexamethasone taper He is reminded to take calcium, vitamin D and will receive Zometa every 3 months He will continue antimicrobial treatment with acyclovir and aspirin for DVT prophylaxis He had no recent dental issues  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 We'll continue the same He is on calcium and vitamin D supplement  S/P autologous bone marrow transplantation ([place]) Recent bone marrow biopsy showed complete remission He will continue antimicrobial treatment as prescribed He has appointment to meet with his transplant physician as scheduled   No orders of the defined types were placed in this encounter.  All questions were answered. The patient knows to call the clinic with any problems, questions or concerns. No barriers to learning was detected. I spent 15 minutes counseling the patient face to face. The total time spent in the appointment was 20 minutes and more than 50% was on counseling and review of test results     Heath Lark, MD 08/24/2017 6:42 AM

## 2017-08-24 NOTE — Assessment & Plan Note (Signed)
Recent bone marrow biopsy showed complete remission He will continue antimicrobial treatment as prescribed He has appointment to meet with his transplant physician as scheduled

## 2017-08-24 NOTE — Assessment & Plan Note (Signed)
Recent myeloma panel and bone marrow biopsy confirmed that the patient has achieved complete response to treatment He will continue monthly daratumumab with Pomalyst He has recently completed dexamethasone taper He is reminded to take calcium, vitamin D and will receive Zometa every 3 months He will continue antimicrobial treatment with acyclovir and aspirin for DVT prophylaxis He had no recent dental issues

## 2017-09-02 ENCOUNTER — Other Ambulatory Visit: Payer: Self-pay | Admitting: Hematology and Oncology

## 2017-09-19 ENCOUNTER — Ambulatory Visit (HOSPITAL_COMMUNITY)
Admission: RE | Admit: 2017-09-19 | Discharge: 2017-09-19 | Disposition: A | Discharge status: Home / Self Care | Payer: Medicaid Other | Source: Ambulatory Visit | Attending: Hematology and Oncology | Admitting: Hematology and Oncology

## 2017-09-19 ENCOUNTER — Inpatient Hospital Stay: Payer: Medicaid Other | Attending: Hematology and Oncology

## 2017-09-19 ENCOUNTER — Inpatient Hospital Stay (HOSPITAL_BASED_OUTPATIENT_CLINIC_OR_DEPARTMENT_OTHER): Payer: Medicaid Other | Admitting: Hematology and Oncology

## 2017-09-19 ENCOUNTER — Telehealth: Payer: Self-pay | Admitting: Hematology and Oncology

## 2017-09-19 ENCOUNTER — Inpatient Hospital Stay: Payer: Medicaid Other

## 2017-09-19 VITALS — BP 140/80 | HR 88 | Temp 97.9°F | Resp 18

## 2017-09-19 DIAGNOSIS — C9002 Multiple myeloma in relapse: Secondary | ICD-10-CM

## 2017-09-19 DIAGNOSIS — Z5112 Encounter for antineoplastic immunotherapy: Secondary | ICD-10-CM | POA: Insufficient documentation

## 2017-09-19 DIAGNOSIS — M898X2 Other specified disorders of bone, upper arm: Secondary | ICD-10-CM | POA: Diagnosis not present

## 2017-09-19 DIAGNOSIS — M25512 Pain in left shoulder: Secondary | ICD-10-CM | POA: Insufficient documentation

## 2017-09-19 DIAGNOSIS — Z7189 Other specified counseling: Secondary | ICD-10-CM

## 2017-09-19 DIAGNOSIS — D61810 Antineoplastic chemotherapy induced pancytopenia: Secondary | ICD-10-CM | POA: Diagnosis not present

## 2017-09-19 DIAGNOSIS — T451X5A Adverse effect of antineoplastic and immunosuppressive drugs, initial encounter: Secondary | ICD-10-CM

## 2017-09-19 LAB — COMPREHENSIVE METABOLIC PANEL
ALK PHOS: 42 U/L (ref 40–150)
ALT: 13 U/L (ref 0–55)
ANION GAP: 8 (ref 3–11)
AST: 14 U/L (ref 5–34)
Albumin: 3.7 g/dL (ref 3.5–5.0)
BILIRUBIN TOTAL: 0.5 mg/dL (ref 0.2–1.2)
BUN: 13 mg/dL (ref 7–26)
CALCIUM: 8.8 mg/dL (ref 8.4–10.4)
CO2: 24 mmol/L (ref 22–29)
Chloride: 110 mmol/L — ABNORMAL HIGH (ref 98–109)
Creatinine, Ser: 1.03 mg/dL (ref 0.70–1.30)
GFR calc Af Amer: >60 mL/min (ref >60)
GLUCOSE: 109 mg/dL (ref 70–140)
POTASSIUM: 3.7 mmol/L (ref 3.5–5.1)
Sodium: 142 mmol/L (ref 136–145)
TOTAL PROTEIN: 6.1 g/dL — AB (ref 6.4–8.3)

## 2017-09-19 LAB — CBC WITH DIFFERENTIAL/PLATELET
BASOS PCT: 2 %
Basophils Absolute: 0.1 10*3/uL (ref 0.0–0.1)
Eosinophils Absolute: 0.4 10*3/uL (ref 0.0–0.5)
Eosinophils Relative: 11 %
HEMATOCRIT: 38.2 % — AB (ref 38.4–49.9)
HEMOGLOBIN: 13.1 g/dL (ref 13.0–17.1)
LYMPHS ABS: 1.4 10*3/uL (ref 0.9–3.3)
Lymphocytes Relative: 39 %
MCH: 32.5 pg (ref 27.2–33.4)
MCHC: 34.4 g/dL (ref 32.0–36.0)
MCV: 94.6 fL (ref 79.3–98.0)
MONO ABS: 0.6 10*3/uL (ref 0.1–0.9)
Monocytes Relative: 18 %
NEUTROS ABS: 1.1 10*3/uL — AB (ref 1.5–6.5)
NEUTROS PCT: 30 %
Platelets: 154 10*3/uL (ref 140–400)
RBC: 4.04 MIL/uL — ABNORMAL LOW (ref 4.20–5.82)
RDW: 16.1 % — AB (ref 11.0–14.6)
WBC: 3.6 10*3/uL — ABNORMAL LOW (ref 4.0–10.3)

## 2017-09-19 MED ORDER — SODIUM CHLORIDE 0.9% FLUSH
10.0000 mL | INTRAVENOUS | Status: DC | PRN
Start: 2017-09-19 — End: 2017-09-19
  Administered 2017-09-19: 10 mL
  Filled 2017-09-19: qty 10

## 2017-09-19 MED ORDER — SODIUM CHLORIDE 0.9% FLUSH
10.0000 mL | Freq: Once | INTRAVENOUS | Status: AC
  Administered 2017-09-19: 10 mL
  Filled 2017-09-19: qty 10

## 2017-09-19 MED ORDER — DEXAMETHASONE SODIUM PHOSPHATE 10 MG/ML IJ SOLN
10.0000 mg | Freq: Once | INTRAMUSCULAR | Status: AC
  Administered 2017-09-19: 10 mg via INTRAVENOUS

## 2017-09-19 MED ORDER — ACETAMINOPHEN 325 MG PO TABS
650.0000 mg | ORAL_TABLET | Freq: Once | ORAL | Status: AC
  Administered 2017-09-19: 650 mg via ORAL

## 2017-09-19 MED ORDER — HEPARIN SOD (PORK) LOCK FLUSH 100 UNIT/ML IV SOLN
500.0000 [IU] | Freq: Once | INTRAVENOUS | Status: AC | PRN
  Administered 2017-09-19: 500 [IU]
  Filled 2017-09-19: qty 5

## 2017-09-19 MED ORDER — PROCHLORPERAZINE MALEATE 10 MG PO TABS
ORAL_TABLET | ORAL | Status: AC
  Filled 2017-09-19: qty 1

## 2017-09-19 MED ORDER — FAMOTIDINE IN NACL 20-0.9 MG/50ML-% IV SOLN
20.0000 mg | Freq: Once | INTRAVENOUS | Status: AC
  Administered 2017-09-19: 20 mg via INTRAVENOUS

## 2017-09-19 MED ORDER — FAMOTIDINE IN NACL 20-0.9 MG/50ML-% IV SOLN
INTRAVENOUS | Status: AC
  Filled 2017-09-19: qty 50

## 2017-09-19 MED ORDER — SODIUM CHLORIDE 0.9 % IV SOLN
Freq: Once | INTRAVENOUS | Status: AC
  Administered 2017-09-19: 11:00:00 via INTRAVENOUS

## 2017-09-19 MED ORDER — SODIUM CHLORIDE 0.9 % IV SOLN
15.6000 mg/kg | Freq: Once | INTRAVENOUS | Status: AC
  Administered 2017-09-19: 1200 mg via INTRAVENOUS
  Filled 2017-09-19: qty 60

## 2017-09-19 MED ORDER — DEXAMETHASONE SODIUM PHOSPHATE 10 MG/ML IJ SOLN
INTRAMUSCULAR | Status: AC
  Filled 2017-09-19: qty 1

## 2017-09-19 MED ORDER — MONTELUKAST SODIUM 10 MG PO TABS
ORAL_TABLET | ORAL | Status: AC
Start: 2017-09-19 — End: 2017-09-19
  Filled 2017-09-19: qty 1

## 2017-09-19 MED ORDER — ACETAMINOPHEN 325 MG PO TABS
ORAL_TABLET | ORAL | Status: AC
Start: 2017-09-19 — End: 2017-09-19
  Filled 2017-09-19: qty 2

## 2017-09-19 MED ORDER — PROCHLORPERAZINE MALEATE 10 MG PO TABS
10.0000 mg | ORAL_TABLET | Freq: Once | ORAL | Status: AC
  Administered 2017-09-19: 10 mg via ORAL

## 2017-09-19 MED ORDER — MONTELUKAST SODIUM 10 MG PO TABS
10.0000 mg | ORAL_TABLET | Freq: Once | ORAL | Status: AC
  Administered 2017-09-19: 10 mg via ORAL

## 2017-09-19 NOTE — Patient Instructions (Signed)
Implanted Port Home Guide An implanted port is a type of central line that is placed under the skin. Central lines are used to provide IV access when treatment or nutrition needs to be given through a person's veins. Implanted ports are used for long-term IV access. An implanted port may be placed because:  You need IV medicine that would be irritating to the small veins in your hands or arms.  You need long-term IV medicines, such as antibiotics.  You need IV nutrition for a long period.  You need frequent blood draws for lab tests.  You need dialysis.  Implanted ports are usually placed in the chest area, but they can also be placed in the upper arm, the abdomen, or the leg. An implanted port has two main parts:  Reservoir. The reservoir is round and will appear as a small, raised area under your skin. The reservoir is the part where a needle is inserted to give medicines or draw blood.  Catheter. The catheter is a thin, flexible tube that extends from the reservoir. The catheter is placed into a large vein. Medicine that is inserted into the reservoir goes into the catheter and then into the vein.  How will I care for my incision site? Do not get the incision site wet. Bathe or shower as directed by your health care provider. How is my port accessed? Special steps must be taken to access the port:  Before the port is accessed, a numbing cream can be placed on the skin. This helps numb the skin over the port site.  Your health care provider uses a sterile technique to access the port. ? Your health care provider must put on a mask and sterile gloves. ? The skin over your port is cleaned carefully with an antiseptic and allowed to dry. ? The port is gently pinched between sterile gloves, and a needle is inserted into the port.  Only "non-coring" port needles should be used to access the port. Once the port is accessed, a blood return should be checked. This helps ensure that the port  is in the vein and is not clogged.  If your port needs to remain accessed for a constant infusion, a clear (transparent) bandage will be placed over the needle site. The bandage and needle will need to be changed every week, or as directed by your health care provider.  Keep the bandage covering the needle clean and dry. Do not get it wet. Follow your health care provider's instructions on how to take a shower or bath while the port is accessed.  If your port does not need to stay accessed, no bandage is needed over the port.  What is flushing? Flushing helps keep the port from getting clogged. Follow your health care provider's instructions on how and when to flush the port. Ports are usually flushed with saline solution or a medicine called heparin. The need for flushing will depend on how the port is used.  If the port is used for intermittent medicines or blood draws, the port will need to be flushed: ? After medicines have been given. ? After blood has been drawn. ? As part of routine maintenance.  If a constant infusion is running, the port may not need to be flushed.  How long will my port stay implanted? The port can stay in for as long as your health care provider thinks it is needed. When it is time for the port to come out, surgery will be   done to remove it. The procedure is similar to the one performed when the port was put in. When should I seek immediate medical care? When you have an implanted port, you should seek immediate medical care if:  You notice a bad smell coming from the incision site.  You have swelling, redness, or drainage at the incision site.  You have more swelling or pain at the port site or the surrounding area.  You have a fever that is not controlled with medicine.  This information is not intended to replace advice given to you by your health care provider. Make sure you discuss any questions you have with your health care provider. Document  Released: 08/01/2005 Document Revised: 01/07/2016 Document Reviewed: 04/08/2013 [employer] Interactive Patient Education  2017 [employer]  

## 2017-09-19 NOTE — Patient Instructions (Signed)
[facility] Discharge Instructions for Patients Receiving Chemotherapy  Today you received the following chemotherapy agents :  daratumumab (Darzalex).  To help prevent nausea and vomiting after your treatment, we encourage you to take your nausea medication as prescribed.   If you develop nausea and vomiting that is not controlled by your nausea medication, call the clinic.   BELOW ARE SYMPTOMS THAT SHOULD BE REPORTED IMMEDIATELY:  *FEVER GREATER THAN 100.5 F  *CHILLS WITH OR WITHOUT FEVER  NAUSEA AND VOMITING THAT IS NOT CONTROLLED WITH YOUR NAUSEA MEDICATION  *UNUSUAL SHORTNESS OF BREATH  *UNUSUAL BRUISING OR BLEEDING  TENDERNESS IN MOUTH AND THROAT WITH OR WITHOUT PRESENCE OF ULCERS  *URINARY PROBLEMS  *BOWEL PROBLEMS  UNUSUAL RASH Items with * indicate a potential emergency and should be followed up as soon as possible.  Feel free to call the clinic should you have any questions or concerns. The clinic phone number is [phone].  Please show the CHEMO ALERT CARD at check-in to the Emergency Department and triage nurse.   

## 2017-09-19 NOTE — Progress Notes (Signed)
Dr. Alvy Bimler okay to tx with ANC 1.1 per treatment conditions.

## 2017-09-19 NOTE — Telephone Encounter (Signed)
Gave patient AVS and calendar of upcoming March appointments.  °

## 2017-09-20 ENCOUNTER — Telehealth: Payer: Self-pay

## 2017-09-20 ENCOUNTER — Encounter: Payer: Self-pay | Admitting: Hematology and Oncology

## 2017-09-20 LAB — KAPPA/LAMBDA LIGHT CHAINS
Kappa free light chain: 10.4 mg/L (ref 3.3–19.4)
Kappa, lambda light chain ratio: 1.22 (ref 0.26–1.65)
LAMDA FREE LIGHT CHAINS: 8.5 mg/L (ref 5.7–26.3)

## 2017-09-20 NOTE — Progress Notes (Signed)
[place] [facility] OFFICE PROGRESS NOTE  Patient Care Team: Charlott Rakes, MD as PCP - General (Family Medicine) Melburn Hake, Costella Hatcher, MD as Consulting Physician (Hematology and Oncology)  SUMMARY OF ONCOLOGIC HISTORY:   Multiple myeloma in relapse [facility]   08/28/2015 Bone Marrow Biopsy    Accession: [id number] biopsy showed 58% myeloma involvement. Cytogenetics was normal and FISH showed Del 13q and t(11;14).       09/07/2015 - 01/22/2016 Chemotherapy    he received 6 cycles of treatment of Velcade and Revlimid with Dex       09/12/2015 - 09/16/2015 Hospital Admission    He was admitted to the hospital with sepsis, treatment was placed on hold and he received one dose of GCSF for leukopenia      01/12/2016 Adverse Reaction    Delay treatment due to neutropenia      01/27/2016 Imaging    ECHO at [facility] showed normal EF      01/27/2016 PET scan    PET scan at [facility] showed innumerable lytic lesions throughout      01/27/2016 Procedure    PFT is within normal limits at [facility]      01/28/2016 Bone Marrow Biopsy    Bone marrow at [facility] showed persistent plasma cell myeloma in a normocellularmarrow (30%) with 20% atypical plasma cells and myeloid hyperplasia.      02/11/2016 - 06/10/2016 Chemotherapy    He is started on salvage Rx with Kyprolis, Cytoxan and dexamethasone      03/24/2016 Imaging    US venous Doppler showed no evidence of deep vein or superficial thrombosis involving the right lower extremity and left lower extremity. No evidence of Baker&'s cyst on the right or left.      05/18/2016 - 05/19/2016 Hospital Admission    He was admitted for chest pain evaluation. Cardiac work-up excluded cardiac events and CT angiogram were negative for PE. It showed esphageal thickening c/w reflux esophagitis likely cause for his chest discomfort      05/23/2016 - 05/25/2016 Hospital Admission    Patient was hospitalized for worsening shortness of breath  and worsening pain. CT angiogram chest negative for pulmonary emboli. Patient had a recent normal 2-D echo and ruled out for MI. Patient noted to be dehydrated. Patient admitted placed on IV fluids. Diuretic discontinued. Patient's gabapentin has been increased per recommendations from prior office visit from patient's oncologist to 600 mg 3 times daily      06/15/2016 Bone Marrow Biopsy    Bone marrow biopsy showed only 2% plasma cell, normal FISH and cytogenetics      07/26/2016 - 07/26/2016 Chemotherapy    He received high dose melphalan at [facility]       07/27/2016 Bone Marrow Transplant    He received autologous stem cell transplant at [facility]      08/05/2016 - 08/10/2016 Hospital Admission    He was admitted to [facility] for management of neutropenic fever and diarrhea. Cultures were negative. Stool culture came back positive for Novovirus and he was managed conservatively.      11/03/2016 Bone Marrow Biopsy    Bone Marrow Flow Cytometry - NO MONOCLONAL B CELL POPULATION IDENTIFIED. - PREDOMINANCE OF T CELLS WITH NON SPECIFIC CHANGES. - SEE NOTE. Diagnosis Comment: Analysis of the lymphoid population shows predominance of T lymphocytes expressing pan T-cell antigens but with relative abundance of CD8 positive cells and reversal of the CD4:CD8 ratio. No significant CD16/56 expression is identified. The T-cell changes  are not considered specific in this setting. B cells represent the minor population with no monoclonality or abnormal phenotype. 4% lambda light chain restricted plasma cells. FISH positive CCND1/IGH 12% of cells.       11/09/2016 PET scan    No FDG avid myeloma is identified. Numerous osseous lytic lesions as above. 2.Hypermetabolic left level III lymph node, likely reactive etiology.  3.Ancillary CT findings as above.      11/29/2016 -  Chemotherapy    He received treatment with daratumumab and Pomalyst       12/13/2016 - 12/16/2016 Hospital Admission    He was  admitted to the hospital for management of dehydration and early sepsis      01/23/2017 Procedure    Status post CT-guided bone marrow biopsy, with tissue specimen sent to pathology for complete histopathologic analysis      01/23/2017 Bone Marrow Biopsy    Bone Marrow, Aspirate,Biopsy, and Clot - SLIGHTLY HYPOCELLULAR BONE MARROW FOR AGE WITH TRILINEAGE HEMATOPOIESIS AND 1% PLASMA CELLS. - SEE COMMENT. PERIPHERAL BLOOD: - NORMOCYTIC-NORMOCHROMIC ANEMIA. - LEUKOPENIA. Diagnosis Note The bone marrow is slightly hypocellular for age with trilineage hematopoiesis and non specific myeloid changes likely related to previous treatment. The plasma cells represent 1% of all cells in the aspirate with lack of large aggregate or sheets. To further evaluate the plasma cell component, immunohistochemical stains will be performed and the results reported in an addendum. Correlation with cytogenetic and FISH studies recommended.      01/31/2017 Adverse Reaction    Treatment was delayed due to SOB      01/31/2017 Imaging    1. No evidence of pulmonary embolism. Sensitivity is moderately degraded by respiratory motion. 2. Multiple myeloma.  Stable compression fracture at T9. 3. Coronary artery calcifications       INTERVAL HISTORY: Please see below for problem oriented charting. He returns with his wife for further follow-up He complained of left shoulder pain that flared up recently He denies recent trauma he denies recent infection, fever or chills His appetite is stable  REVIEW OF SYSTEMS:   Constitutional: Denies fevers, chills or abnormal weight loss Eyes: Denies blurriness of vision Ears, nose, mouth, throat, and face: Denies mucositis or sore throat Respiratory: Denies cough, dyspnea or wheezes Cardiovascular: Denies palpitation, chest discomfort or lower extremity swelling Gastrointestinal:  Denies nausea, heartburn or change in bowel habits Skin: Denies abnormal skin  rashes Lymphatics: Denies new lymphadenopathy or easy bruising Neurological:Denies numbness, tingling or new weaknesses Behavioral/Psych: Mood is stable, no new changes  All other systems were reviewed with the patient and are negative.  I have reviewed the past medical history, past surgical history, social history and family history with the patient and they are unchanged from previous note.  ALLERGIES:  is allergic to benadryl [diphenhydramine]; trazodone and nefazodone; and latex.  MEDICATIONS:  Current Outpatient Medications  Medication Sig Dispense Refill  . acetaminophen (TYLENOL) 500 MG tablet Take 1,000 mg by mouth every 6 (six) hours as needed for fever.    Marland Kitchen acyclovir (ZOVIRAX) 400 MG tablet Take 1 tablet (400 mg total) by mouth 2 (two) times daily. 60 tablet 6  . aluminum-magnesium hydroxide-simethicone (MAALOX) 027-253-66 MG/5ML SUSP Take 30 mLs by mouth every 4 (four) hours as needed.    Marland Kitchen aspirin 81 MG chewable tablet Chew 81 mg by mouth daily.    Marland Kitchen CALCIUM PO Take 1 tablet by mouth daily.    . cholecalciferol (VITAMIN D) 1000 units tablet Take 1,000 Units by  mouth daily.     . folic acid (FOLVITE) 1 MG tablet TAKE 1 TABLET BY MOUTH ONCE DAILY 90 tablet 1  . gabapentin (NEURONTIN) 300 MG capsule Take 1-2 capsules (300-600 mg total) by mouth 2 (two) times daily. Take 300 mg in the morning and 600 mg at night. 100 capsule 9  . lidocaine-prilocaine (EMLA) cream Apply to affected area once 30 g 3  . lisinopril (PRINIVIL,ZESTRIL) 20 MG tablet Take 1 tablet (20 mg total) by mouth daily. 30 tablet 6  . loperamide (IMODIUM) 2 MG capsule Take 2 mg by mouth 4 (four) times daily as needed.    . Multiple Vitamin (MULTIVITAMIN) tablet Take 1 tablet by mouth daily.  1  . ondansetron (ZOFRAN) 8 MG tablet Take 8 mg by mouth every 8 (eight) hours as needed.    . pomalidomide (POMALYST) 2 MG capsule Take 1 capsule (2 mg total) by mouth daily. Take with water on days 1-21. Repeat every 28 days.  21 capsule 0  . prochlorperazine (COMPAZINE) 10 MG tablet Take 10 mg by mouth every 6 (six) hours as needed.    . ranitidine (ZANTAC) 150 MG tablet TAKE 1 TABLET BY MOUTH ONCE DAILY AT BEDTIME 30 tablet 1  . traMADol (ULTRAM) 50 MG tablet Take 1-2 tablets (50-100 mg total) by mouth every 6 (six) hours as needed. for pain 90 tablet 0  . zolpidem (AMBIEN) 10 MG tablet Take 1 tablet (10 mg total) by mouth at bedtime as needed for sleep. 30 tablet 3   No current facility-administered medications for this visit.    Facility-Administered Medications Ordered in Other Visits  Medication Dose Route Frequency Provider Last Rate Last Dose  . heparin lock flush 100 unit/mL  500 Units Intracatheter Once PRN Alvy Bimler, Finis Hendricksen, MD      . sodium chloride flush (NS) 0.9 % injection 10 mL  10 mL Intracatheter PRN Alvy Bimler, Nikolai Wilczak, MD   10 mL at 03/21/17 0924    PHYSICAL EXAMINATION: ECOG PERFORMANCE STATUS: 1 - Symptomatic but completely ambulatory  Vitals:   09/19/17 0926  BP: (!) 141/91  Pulse: 72  Resp: 18  Temp: 98 F (36.7 C)  SpO2: 100%   Filed Weights   09/19/17 0926  Weight: 184 lb 8 oz (83.7 kg)    GENERAL:alert, no distress and comfortable SKIN: skin color, texture, turgor are normal, no rashes or significant lesions EYES: normal, Conjunctiva are pink and non-injected, sclera clear OROPHARYNX:no exudate, no erythema and lips, buccal mucosa, and tongue normal  NECK: supple, thyroid normal size, non-tender, without nodularity LYMPH:  no palpable lymphadenopathy in the cervical, axillary or inguinal LUNGS: clear to auscultation and percussion with normal breathing effort HEART: regular rate & rhythm and no murmurs and no lower extremity edema ABDOMEN:abdomen soft, non-tender and normal bowel sounds Musculoskeletal:no cyanosis of digits and no clubbing  NEURO: alert & oriented x 3 with fluent speech, no focal motor/sensory deficits  LABORATORY DATA:  I have reviewed the data as listed     Component Value Date/Time   NA 142 09/19/2017 0852   NA 140 07/18/2017 0909   K 3.7 09/19/2017 0852   K 3.6 07/18/2017 0909   CL 110 (H) 09/19/2017 0852   CO2 24 09/19/2017 0852   CO2 24 07/18/2017 0909   GLUCOSE 109 09/19/2017 0852   GLUCOSE 109 07/18/2017 0909   BUN 13 09/19/2017 0852   BUN 12.1 07/18/2017 0909   CREATININE 1.03 09/19/2017 0852   CREATININE 1.3 07/18/2017 0909  CALCIUM 8.8 09/19/2017 0852   CALCIUM 9.3 07/18/2017 0909   PROT 6.1 (L) 09/19/2017 0852   PROT 6.4 07/18/2017 0909   PROT 6.7 07/18/2017 0909   ALBUMIN 3.7 09/19/2017 0852   ALBUMIN 3.9 07/18/2017 0909   AST 14 09/19/2017 0852   AST 13 07/18/2017 0909   ALT 13 09/19/2017 0852   ALT 15 07/18/2017 0909   ALKPHOS 42 09/19/2017 0852   ALKPHOS 49 07/18/2017 0909   BILITOT 0.5 09/19/2017 0852   BILITOT 0.50 07/18/2017 0909   GFRNONAA >60 09/19/2017 0852   GFRNONAA 71 07/24/2015 1155   GFRAA >60 09/19/2017 0852   GFRAA 82 07/24/2015 1155    No results found for: SPEP, UPEP  Lab Results  Component Value Date   WBC 3.6 (L) 09/19/2017   NEUTROABS 1.1 (L) 09/19/2017   HGB 13.1 09/19/2017   HCT 38.2 (L) 09/19/2017   MCV 94.6 09/19/2017   PLT 154 09/19/2017      Chemistry      Component Value Date/Time   NA 142 09/19/2017 0852   NA 140 07/18/2017 0909   K 3.7 09/19/2017 0852   K 3.6 07/18/2017 0909   CL 110 (H) 09/19/2017 0852   CO2 24 09/19/2017 0852   CO2 24 07/18/2017 0909   BUN 13 09/19/2017 0852   BUN 12.1 07/18/2017 0909   CREATININE 1.03 09/19/2017 0852   CREATININE 1.3 07/18/2017 0909      Component Value Date/Time   CALCIUM 8.8 09/19/2017 0852   CALCIUM 9.3 07/18/2017 0909   ALKPHOS 42 09/19/2017 0852   ALKPHOS 49 07/18/2017 0909   AST 14 09/19/2017 0852   AST 13 07/18/2017 0909   ALT 13 09/19/2017 0852   ALT 15 07/18/2017 0909   BILITOT 0.5 09/19/2017 0852   BILITOT 0.50 07/18/2017 0909      ASSESSMENT & PLAN:  Multiple myeloma in relapse (HCC) Recent myeloma  panel confirmed that the patient has achieved complete response to treatment He will continue monthly daratumumab with Pomalyst, to be taken for 21 days and 7 days off He has recently completed dexamethasone taper He is reminded to take calcium, vitamin D and will receive Zometa every 3 months He will continue antimicrobial treatment with acyclovir and aspirin for DVT prophylaxis He had no recent dental issues  Antineoplastic chemotherapy induced pancytopenia ([place]) The patient has borderline neutropenia He is at the tail end of his treatment He is not symptomatic, with no recent infections I recommend him to complete his current treatment and to wait until his next blood draw to ensure there is nothing suspicious on CBC His oncologist at [facility] suggested repeat bone marrow transplant for further evaluation for the abnormal CBC We discussed this and ultimately, the patient agreed to hold the start date of next cycle of Pomalyst until he is seen back care  Acute pain of left shoulder He complained of left shoulder pain On examination, it appears that he have pain at the anterior portion of his shoulder joint I recommend x-ray for evaluation I do not believe this is due to multiple myeloma We recommend conservative management only if nothing suspicious is seen on the x-ray  Goals of care, counseling/discussion I have extensive discussion with his wife regarding goals of care and future plan of care It are thinking about relocating to [place] area around June of this year We discussed logistics of transferring his care to another facility The patient is planning to look for housing around  April of this year and I recommend several places for him to inquire about possibility of transfer of care We also discussed the plan to remain on monthly daratumumab and Pomalyst for minimum of 2 years, potentially indefinitely if he tolerates treatment well   Orders Placed This  Encounter  Procedures  . DG Shoulder Left    Standing Status:   Future    Number of Occurrences:   1    Standing Expiration Date:   11/18/2018    Order Specific Question:   Reason for Exam (SYMPTOM  OR DIAGNOSIS REQUIRED)    Answer:   left shoulder pain, Hx myeloma    Order Specific Question:   Preferred imaging location?    Answer:   [facility]    Order Specific Question:   Radiology Contrast Protocol - do NOT remove file path    Answer:   [email or web address]   All questions were answered. The patient knows to call the clinic with any problems, questions or concerns. No barriers to learning was detected. I spent 25 minutes counseling the patient face to face. The total time spent in the appointment was 40 minutes and more than 50% was on counseling and review of test results     Heath Lark, MD 09/20/2017 8:10 AM

## 2017-09-20 NOTE — Telephone Encounter (Signed)
Spoke with Erik Nguyen by phone regarding shoulder xray. Per Dr Alvy Bimler, Erik Nguyen to continue taking tramadol as needed and see PCP regarding a referral for possible injections for the shoulder pain. Erik Nguyen verbalizes understanding. No questions or concerns at this time.

## 2017-09-20 NOTE — Assessment & Plan Note (Signed)
The patient has borderline neutropenia He is at the tail end of his treatment He is not symptomatic, with no recent infections I recommend him to complete his current treatment and to wait until his next blood draw to ensure there is nothing suspicious on CBC His oncologist at [facility] suggested repeat bone marrow transplant for further evaluation for the abnormal CBC We discussed this and ultimately, the patient agreed to hold the start date of next cycle of Pomalyst until he is seen back care

## 2017-09-20 NOTE — Assessment & Plan Note (Signed)
He complained of left shoulder pain On examination, it appears that he have pain at the anterior portion of his shoulder joint I recommend x-ray for evaluation I do not believe this is due to multiple myeloma We recommend conservative management only if nothing suspicious is seen on the x-ray

## 2017-09-20 NOTE — Assessment & Plan Note (Signed)
I have extensive discussion with his wife regarding goals of care and future plan of care It are thinking about relocating to [place] near [place] area around June of this year We discussed logistics of transferring his care to another facility The patient is planning to look for housing around April of this year and I recommend several places for him to inquire about possibility of transfer of care We also discussed the plan to remain on monthly daratumumab and Pomalyst for minimum of 2 years, potentially indefinitely if he tolerates treatment well

## 2017-09-20 NOTE — Telephone Encounter (Signed)
lvm instructing pt to return call.

## 2017-09-20 NOTE — Assessment & Plan Note (Signed)
Recent myeloma panel confirmed that the patient has achieved complete response to treatment He will continue monthly daratumumab with Pomalyst, to be taken for 21 days and 7 days off He has recently completed dexamethasone taper He is reminded to take calcium, vitamin D and will receive Zometa every 3 months He will continue antimicrobial treatment with acyclovir and aspirin for DVT prophylaxis He had no recent dental issues

## 2017-09-21 LAB — MULTIPLE MYELOMA PANEL, SERUM
ALBUMIN/GLOB SERPL: 1.6 (ref 0.7–1.7)
ALPHA 1: 0.2 g/dL (ref 0.0–0.4)
Albumin SerPl Elph-Mcnc: 3.4 g/dL (ref 2.9–4.4)
Alpha2 Glob SerPl Elph-Mcnc: 0.7 g/dL (ref 0.4–1.0)
B-Globulin SerPl Elph-Mcnc: 0.8 g/dL (ref 0.7–1.3)
GAMMA GLOB SERPL ELPH-MCNC: 0.5 g/dL (ref 0.4–1.8)
GLOBULIN, TOTAL: 2.2 g/dL (ref 2.2–3.9)
IGA: 66 mg/dL (ref 61–437)
IGM (IMMUNOGLOBULIN M), SRM: 12 mg/dL — AB (ref 20–172)
IgG (Immunoglobin G), Serum: 561 mg/dL — ABNORMAL LOW (ref 700–1600)
Total Protein ELP: 5.6 g/dL — ABNORMAL LOW (ref 6.0–8.5)

## 2017-09-22 ENCOUNTER — Other Ambulatory Visit: Payer: Self-pay | Admitting: *Deleted

## 2017-09-22 MED ORDER — POMALIDOMIDE 2 MG PO CAPS: 2.0000 mg | ORAL_CAPSULE | Freq: Every day | ORAL | 0 refills | Status: DC

## 2017-10-04 ENCOUNTER — Encounter (HOSPITAL_COMMUNITY): Payer: Self-pay | Admitting: Family Medicine

## 2017-10-04 ENCOUNTER — Emergency Department (HOSPITAL_COMMUNITY)
Admission: EM | Admit: 2017-10-04 | Discharge: 2017-10-04 | Disposition: A | Discharge status: Home / Self Care | Payer: Medicaid Other | Attending: Emergency Medicine | Admitting: Emergency Medicine

## 2017-10-04 DIAGNOSIS — I10 Essential (primary) hypertension: Secondary | ICD-10-CM | POA: Diagnosis not present

## 2017-10-04 DIAGNOSIS — Z91040 Latex allergy status: Secondary | ICD-10-CM | POA: Insufficient documentation

## 2017-10-04 DIAGNOSIS — Z7984 Long term (current) use of oral hypoglycemic drugs: Secondary | ICD-10-CM | POA: Diagnosis not present

## 2017-10-04 DIAGNOSIS — R7303 Prediabetes: Secondary | ICD-10-CM | POA: Insufficient documentation

## 2017-10-04 DIAGNOSIS — R519 Headache, unspecified: Secondary | ICD-10-CM

## 2017-10-04 DIAGNOSIS — Z79899 Other long term (current) drug therapy: Secondary | ICD-10-CM | POA: Insufficient documentation

## 2017-10-04 DIAGNOSIS — R51 Headache: Secondary | ICD-10-CM | POA: Insufficient documentation

## 2017-10-04 DIAGNOSIS — Z87891 Personal history of nicotine dependence: Secondary | ICD-10-CM | POA: Insufficient documentation

## 2017-10-04 MED ORDER — LISINOPRIL 10 MG PO TABS
10.0000 mg | ORAL_TABLET | Freq: Once | ORAL | Status: AC
  Administered 2017-10-04: 10 mg via ORAL
  Filled 2017-10-04: qty 1

## 2017-10-04 MED ORDER — LISINOPRIL 30 MG PO TABS: 30.0000 mg | ORAL_TABLET | Freq: Every day | ORAL | 0 refills | Status: DC

## 2017-10-04 MED ORDER — ACETAMINOPHEN 325 MG PO TABS
650.0000 mg | ORAL_TABLET | Freq: Once | ORAL | Status: AC
  Administered 2017-10-04: 650 mg via ORAL
  Filled 2017-10-04: qty 2

## 2017-10-04 NOTE — ED Provider Notes (Signed)
[facility] DEPT Provider Note   CSN: [id number] Arrival date & time: 10/04/17  1501     History   Chief Complaint Chief Complaint  Patient presents with  . Headache    HPI Erik Nguyen is a 62 y.o. male.  HPI Pt has been having a mild to moderate headache for the last few days.   The headache would come and go.  Today the headache has been persistent.  Pt has not taken anything for the pain.  He generally does not like to take medications.  He was concerned that his BP was elevated and this was causing his headache so he came to the ED.  Pt takes lisinopril and HCTZ for his Blood pressure.  He did take his medications today.  He denies fever.  No trouble with nausea or vomiting.  No numbness or weakness.  No trouble with  His speech. Past Medical History:  Diagnosis Date  . Dyspnea    exertion   . Encounter for antineoplastic chemotherapy 02/09/2016  . GERD (gastroesophageal reflux disease)   . History of syphilis 02/24/2016  . Hypertension Dx 2016  . Multiple myeloma not having achieved remission ([place]) 09/01/2015  . Pre-diabetes   . Priapism 2014     Patient Active Problem List   Diagnosis Date Noted  . Acute pain of left shoulder 09/19/2017  . Cataracts, bilateral 07/18/2017  . Prediabetes 01/05/2017  . Physical deconditioning 01/03/2017  . Cough in adult 12/20/2016  . Acute bronchitis 12/13/2016  . Blood glucose elevated 12/13/2016  . Sepsis ([place]) 12/09/2016  . Goals of care, counseling/discussion 11/18/2016  . S/P autologous bone marrow transplantation ([place]) 08/12/2016  . Mild protein-calorie malnutrition ([place]) 08/12/2016  . Dysphagia 08/12/2016  . Essential hypertension 05/26/2016  . Hypoxia 05/24/2016  . Dehydration 05/23/2016  . Chest pain 05/18/2016  . GERD (gastroesophageal reflux disease) 05/18/2016  . Blepharoconjunctivitis of both eyes 04/21/2016  . Dyspnea 03/25/2016  . Left leg pain 03/24/2016  . Port catheter in place  02/25/2016  . Pancytopenia due to antineoplastic chemotherapy ([place]) 02/25/2016  . History of syphilis 02/24/2016  . Encounter for antineoplastic chemotherapy 02/09/2016  . Skin rash 12/14/2015  . 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 Anemia due to antineoplastic chemotherapy 09/22/2015  . Antineoplastic chemotherapy induced pancytopenia ([place])   . Bone pain   . Cancer associated pain 09/02/2015  . Multiple myeloma in relapse ([place]) 09/01/2015  . 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 Vitamin D deficiency 07/22/2015  . Closed right clavicular fracture 07/17/2015  . Chest wall pain 03/12/2015  . Thoracolumbar back pain 03/12/2015  . Allergic rhinitis 03/12/2015  . Poor dentition 01/15/2015  . Nerve damage 01/15/2015  . Esophagitis 12/28/2014  . Lower GI bleed   . Hemorrhoids 11/06/2014  . HTN (hypertension) 11/06/2014  . Priapism 11/06/2014  . Decreased visual acuity 11/06/2014  . Family history of prostate cancer in father 11/06/2014    Past Surgical History:  Procedure Laterality Date  . COLONOSCOPY    . POLYPECTOMY         Home Medications    Prior to Admission medications   Medication Sig Start Date End Date Taking? Authorizing Provider  acetaminophen (TYLENOL) 500 MG tablet Take 1,000 mg by mouth every 6 (six) hours as needed for fever.   Yes [provider]  acyclovir (ZOVIRAX) 400 MG tablet Take 1 tablet (400 mg total) by mouth 2 (two) times daily. 05/18/17  Yes Heath Lark, MD  aluminum-magnesium  hydroxide-simethicone (MAALOX) 200-200-20 MG/5ML SUSP Take 30 mLs by mouth every 4 (four) hours as needed.   Yes [provider]  aspirin 81 MG chewable tablet Chew 81 mg by mouth daily.   Yes [provider]  CALCIUM PO Take 1 tablet by mouth daily.   Yes [provider]  cholecalciferol (VITAMIN D) 1000 units tablet Take 1,000 Units by mouth daily.    Yes [provider]  folic acid (FOLVITE) 1 MG tablet  TAKE 1 TABLET BY MOUTH ONCE DAILY 07/18/17  Yes Gorsuch, Ni, MD  gabapentin (NEURONTIN) 300 MG capsule Take 1-2 capsules (300-600 mg total) by mouth 2 (two) times daily. Take 300 mg in the morning and 600 mg at night. 03/21/17  Yes Gorsuch, Ni, MD  hydrochlorothiazide (HYDRODIURIL) 25 MG tablet Take 25 mg by mouth daily.   Yes [provider]  lidocaine-prilocaine (EMLA) cream Apply to affected area once 05/12/16  Yes Gorsuch, Ni, MD  lisinopril (PRINIVIL,ZESTRIL) 20 MG tablet Take 1 tablet (20 mg total) by mouth daily. 07/11/17  Yes Newlin, Enobong, MD  loperamide (IMODIUM) 2 MG capsule Take 2 mg by mouth 4 (four) times daily as needed. 08/10/16  Yes [provider]  metFORMIN (GLUCOPHAGE) 500 MG tablet Take 500 mg by mouth daily as needed.   Yes [provider]  Multiple Vitamin (MULTIVITAMIN) tablet Take 1 tablet by mouth daily. 08/02/16  Yes [provider]  ondansetron (ZOFRAN) 8 MG tablet Take 8 mg by mouth every 8 (eight) hours as needed. 08/10/16  Yes [provider]  prochlorperazine (COMPAZINE) 10 MG tablet Take 10 mg by mouth every 6 (six) hours as needed. 07/25/16  Yes [provider]  ranitidine (ZANTAC) 150 MG tablet TAKE 1 TABLET BY MOUTH ONCE DAILY AT BEDTIME 09/04/17  Yes Gorsuch, Ni, MD  traMADol (ULTRAM) 50 MG tablet Take 1-2 tablets (50-100 mg total) by mouth every 6 (six) hours as needed. for pain 04/25/17  Yes Gorsuch, Ni, MD  zolpidem (AMBIEN) 10 MG tablet Take 1 tablet (10 mg total) by mouth at bedtime as needed for sleep. 04/25/17 10/04/17 Yes Gorsuch, Ni, MD  pomalidomide (POMALYST) 2 MG capsule Take 1 capsule (2 mg total) by mouth daily. Take with water on days 1-21. Repeat every 28 days. Patient not taking: Reported on 10/04/2017 09/22/17   Gorsuch, Ni, MD    Family History Family History  Problem Relation Age of Onset  . Asthma Mother   . Cancer Father   . Prostate cancer Father   . Colon cancer Neg Hx   . Rectal cancer  Neg Hx   . Stomach cancer Neg Hx     Social History Social History   Tobacco Use  . Smoking status: Former Smoker    Packs/day: 0.00    Years: 30.00    Pack years: 0.00    Types: Cigarettes    Last attempt to quit: 09/21/2013    Years since quitting: 4.0  . Smokeless tobacco: Never Used  Substance Use Topics  . Alcohol use: No    Alcohol/week: 0.0 oz    Comment: last use Jan 2015  . Drug use: No    Comment: last use Sep 21, 2013     Allergies   Benadryl [diphenhydramine]; Trazodone and nefazodone; and Latex   Review of Systems Review of Systems  All other systems reviewed and are negative.    Physical Exam Updated Vital Signs BP (!) 156/103 (BP Location: Left Arm)   Pulse 90     Temp 98.4 F (36.9 C) (Oral)   Resp 18   Ht 1.676 m (5' 6")   Wt 80.7 kg (178 lb)   SpO2 97%   BMI 28.73 kg/m   Physical Exam  Constitutional: He is oriented to person, place, and time. He appears well-developed and well-nourished. No distress.  HENT:  Head: Normocephalic and atraumatic.  Right Ear: External ear normal.  Left Ear: External ear normal.  Mouth/Throat: Oropharynx is clear and moist.  Eyes: Conjunctivae are normal. Right eye exhibits no discharge. Left eye exhibits no discharge. No scleral icterus.  Neck: Neck supple. No tracheal deviation present.  Cardiovascular: Normal rate, regular rhythm and intact distal pulses.  Pulmonary/Chest: Effort normal and breath sounds normal. No stridor. No respiratory distress. He has no wheezes. He has no rales.  Abdominal: Soft. Bowel sounds are normal. He exhibits no distension. There is no tenderness. There is no rebound and no guarding.  Musculoskeletal: He exhibits no edema or tenderness.  Neurological: He is alert and oriented to person, place, and time. He has normal strength. No cranial nerve deficit (No facial droop, extraocular movements intact, tongue midline ) or sensory deficit. He exhibits normal muscle tone. He displays no  seizure activity. Coordination normal.  No pronator drift bilateral upper extrem, able to hold both legs off bed for 5 seconds, sensation intact in all extremities, no visual field cuts, no left or right sided neglect, normal finger-nose exam bilaterally, no nystagmus noted   Skin: Skin is warm and dry. No rash noted.  Psychiatric: He has a normal mood and affect.  Nursing note and vitals reviewed.    ED Treatments / Results   Procedures Procedures (including critical care time)  Medications Ordered in ED Medications  acetaminophen (TYLENOL) tablet 650 mg (not administered)  lisinopril (PRINIVIL,ZESTRIL) tablet 10 mg (not administered)     Initial Impression / Assessment and Plan / ED Course  I have reviewed the triage vital signs and the nursing notes.  Pertinent labs & imaging results that were available during my care of the patient were reviewed by me and considered in my medical decision making (see chart for details).   Patient presented to the emergency room for evaluation.  Patient states that the headache is mild to moderate in severity.  He has not taken any medications for his headache.  He was worried because his blood pressure was elevated today.  Patient denies any neurologic symptoms.  He also has not having any fevers or chills.  His symptoms are not worrisome for hypertensive emergency, stroke or other emergent condition.  Patient's neurologic exam is normal.  I will give him a dose of Tylenol.  Patient also can increase his lisinopril dose.  I discussed warning signs and precautions.  I will have him follow-up with his primary care doctor.   Final Clinical Impressions(s) / ED Diagnoses   Final diagnoses:  Headache disorder  Hypertension, unspecified type    Increase lisinopril to 30   Knapp, Jon, MD 10/04/17 1713  

## 2017-10-04 NOTE — Discharge Instructions (Signed)
Try over-the-counter Tylenol for your headache, continue to monitor your blood pressure, follow-up with your primary care doctor next week to be rechecked if the symptoms have not resolved, return to the emergency room for fever, vomiting, numbness, weakness or other concerning symptoms  Increase your lisinopril to 30 mg daily

## 2017-10-04 NOTE — ED Triage Notes (Signed)
Patient has multiple myeloma with chemotherapy once a month. Today, he is presenting to the emergency department with generalized headache that started this morning. Patient relates the headache to his high pressure. Patient denies blurry vision or dizziness. Reports being lightheaded when ambulating.

## 2017-10-04 NOTE — ED Notes (Signed)
Bed: WA13 Expected date:  Expected time:  Means of arrival:  Comments: Triage 3 

## 2017-10-11 ENCOUNTER — Encounter: Payer: Self-pay | Admitting: Family Medicine

## 2017-10-11 ENCOUNTER — Ambulatory Visit: Payer: Medicaid Other | Attending: Family Medicine | Admitting: Family Medicine

## 2017-10-11 VITALS — BP 120/84 | HR 90 | Temp 97.7°F | Ht 66.0 in | Wt 183.2 lb

## 2017-10-11 DIAGNOSIS — R7303 Prediabetes: Secondary | ICD-10-CM | POA: Insufficient documentation

## 2017-10-11 DIAGNOSIS — R0600 Dyspnea, unspecified: Secondary | ICD-10-CM | POA: Diagnosis not present

## 2017-10-11 DIAGNOSIS — G620 Drug-induced polyneuropathy: Secondary | ICD-10-CM | POA: Insufficient documentation

## 2017-10-11 DIAGNOSIS — C9002 Multiple myeloma in relapse: Secondary | ICD-10-CM | POA: Diagnosis not present

## 2017-10-11 DIAGNOSIS — Z79899 Other long term (current) drug therapy: Secondary | ICD-10-CM | POA: Diagnosis not present

## 2017-10-11 DIAGNOSIS — Z7984 Long term (current) use of oral hypoglycemic drugs: Secondary | ICD-10-CM | POA: Insufficient documentation

## 2017-10-11 DIAGNOSIS — Z7982 Long term (current) use of aspirin: Secondary | ICD-10-CM | POA: Insufficient documentation

## 2017-10-11 DIAGNOSIS — I10 Essential (primary) hypertension: Secondary | ICD-10-CM | POA: Diagnosis not present

## 2017-10-11 DIAGNOSIS — M5431 Sciatica, right side: Secondary | ICD-10-CM

## 2017-10-11 DIAGNOSIS — M5442 Lumbago with sciatica, left side: Secondary | ICD-10-CM | POA: Insufficient documentation

## 2017-10-11 DIAGNOSIS — M5432 Sciatica, left side: Secondary | ICD-10-CM

## 2017-10-11 DIAGNOSIS — M5441 Lumbago with sciatica, right side: Secondary | ICD-10-CM | POA: Diagnosis not present

## 2017-10-11 DIAGNOSIS — K219 Gastro-esophageal reflux disease without esophagitis: Secondary | ICD-10-CM | POA: Insufficient documentation

## 2017-10-11 DIAGNOSIS — N4830 Priapism, unspecified: Secondary | ICD-10-CM | POA: Diagnosis not present

## 2017-10-11 DIAGNOSIS — Z888 Allergy status to other drugs, medicaments and biological substances status: Secondary | ICD-10-CM | POA: Diagnosis not present

## 2017-10-11 LAB — POCT GLYCOSYLATED HEMOGLOBIN (HGB A1C): HEMOGLOBIN A1C: 6.1

## 2017-10-11 MED ORDER — TIZANIDINE HCL 4 MG PO TABS: 4.0000 mg | ORAL_TABLET | Freq: Three times a day (TID) | ORAL | 0 refills | Status: AC | PRN

## 2017-10-11 NOTE — Progress Notes (Signed)
Subjective:  Patient ID: Erik Nguyen, male    DOB: August 13, 1956  Age: 62 y.o. MRN: [id number]  CC: Hypertension   HPI Erik Nguyen is a  62 year old male with a history of prediabetes (A1c 6.4 which is up from 6.0), hypertension, multiple myeloma (status post autologous stem cell transplant in 07/2016), chemotherapy-induced neuropathy, GERD who presents today for a follow-up visit. He was last seen by his oncologist on 09/20/17 and remains on daratumumab with Pomalyst monthly (21 days on and 7 days off) along with Zometa every 3 months; he has achieved a complete response to treatment as per oncology.  He was seen at the ED, 1 week ago after he presented with headaches and was found to have elevated blood pressure.  The dose of his lisinopril was increased and his blood pressure is controlled today.  He complains of left-sided low back pain of sudden onset which started this morning and does not radiate down his lower extremities, is described as a 7/10 and unrelieved with tramadol.  He has not had similar symptoms in the past.  Denies weakness in lower extremities or loss of sphincteric function. With regards to his prediabetes he tolerates metformin and denies adverse effects.  Past Medical History:  Diagnosis Date  . Dyspnea    exertion   . Encounter for antineoplastic chemotherapy 02/09/2016  . GERD (gastroesophageal reflux disease)   . History of syphilis 02/24/2016  . Hypertension Dx 2016  . Multiple myeloma not having achieved remission ([place]) 09/01/2015  . Pre-diabetes   . Priapism 2014     Past Surgical History:  Procedure Laterality Date  . COLONOSCOPY    . POLYPECTOMY      Allergies  Allergen Reactions  . Benadryl [Diphenhydramine] Other (See Comments)    priapism  . Trazodone And Nefazodone     priapism  . Latex Itching and Rash     Outpatient Medications Prior to Visit  Medication Sig Dispense Refill  . acetaminophen (TYLENOL) 500 MG tablet Take 1,000 mg by  mouth every 6 (six) hours as needed for fever.    Marland Kitchen acyclovir (ZOVIRAX) 400 MG tablet Take 1 tablet (400 mg total) by mouth 2 (two) times daily. 60 tablet 6  . aluminum-magnesium hydroxide-simethicone (MAALOX) 024-097-35 MG/5ML SUSP Take 30 mLs by mouth every 4 (four) hours as needed.    Marland Kitchen aspirin 81 MG chewable tablet Chew 81 mg by mouth daily.    Marland Kitchen CALCIUM PO Take 1 tablet by mouth daily.    . cholecalciferol (VITAMIN D) 1000 units tablet Take 1,000 Units by mouth daily.     . folic acid (FOLVITE) 1 MG tablet TAKE 1 TABLET BY MOUTH ONCE DAILY 90 tablet 1  . gabapentin (NEURONTIN) 300 MG capsule Take 1-2 capsules (300-600 mg total) by mouth 2 (two) times daily. Take 300 mg in the morning and 600 mg at night. 100 capsule 9  . hydrochlorothiazide (HYDRODIURIL) 25 MG tablet Take 25 mg by mouth daily.    Marland Kitchen lidocaine-prilocaine (EMLA) cream Apply to affected area once 30 g 3  . lisinopril (PRINIVIL,ZESTRIL) 30 MG tablet Take 1 tablet (30 mg total) by mouth daily. 30 tablet 0  . loperamide (IMODIUM) 2 MG capsule Take 2 mg by mouth 4 (four) times daily as needed.    . metFORMIN (GLUCOPHAGE) 500 MG tablet Take 500 mg by mouth daily as needed.    . Multiple Vitamin (MULTIVITAMIN) tablet Take 1 tablet by mouth daily.  1  . ondansetron (ZOFRAN) 8 MG  tablet Take 8 mg by mouth every 8 (eight) hours as needed.    . prochlorperazine (COMPAZINE) 10 MG tablet Take 10 mg by mouth every 6 (six) hours as needed.    . ranitidine (ZANTAC) 150 MG tablet TAKE 1 TABLET BY MOUTH ONCE DAILY AT BEDTIME 30 tablet 1  . traMADol (ULTRAM) 50 MG tablet Take 1-2 tablets (50-100 mg total) by mouth every 6 (six) hours as needed. for pain 90 tablet 0  . pomalidomide (POMALYST) 2 MG capsule Take 1 capsule (2 mg total) by mouth daily. Take with water on days 1-21. Repeat every 28 days. (Patient not taking: Reported on 10/11/2017) 21 capsule 0  . zolpidem (AMBIEN) 10 MG tablet Take 1 tablet (10 mg total) by mouth at bedtime as needed  for sleep. 30 tablet 3   Facility-Administered Medications Prior to Visit  Medication Dose Route Frequency Provider Last Rate Last Dose  . heparin lock flush 100 unit/mL  500 Units Intracatheter Once PRN Alvy Bimler, Ni, MD      . sodium chloride flush (NS) 0.9 % injection 10 mL  10 mL Intracatheter PRN Alvy Bimler, Ni, MD   10 mL at 03/21/17 0924    ROS Review of Systems  Constitutional: Negative for activity change and appetite change.  HENT: Negative for sinus pressure and sore throat.   Eyes: Negative for visual disturbance.  Respiratory: Negative for cough, chest tightness and shortness of breath.   Cardiovascular: Negative for chest pain and leg swelling.  Gastrointestinal: Negative for abdominal distention, abdominal pain, constipation and diarrhea.  Endocrine: Negative.   Genitourinary: Negative for dysuria.  Musculoskeletal: Positive for back pain. Negative for joint swelling and myalgias.  Skin: Negative for rash.  Allergic/Immunologic: Negative.   Neurological: Negative for weakness, light-headedness and numbness.  Psychiatric/Behavioral: Negative for dysphoric mood and suicidal ideas.    Objective:  BP 120/84   Pulse 90   Temp 97.7 F (36.5 C) (Oral)   Ht _0  (1.676 m)   Wt 183 lb 3.2 oz (83.1 kg)   SpO2 99%   BMI 29.57 kg/m   BP/Weight 10/11/2017 01/29/8371 9/0/2111  Systolic BP 552 080 223  Diastolic BP 84 361 80  Wt. (Lbs) 183.2 178 -  BMI 29.57 28.73 -      Physical Exam  Constitutional: He is oriented to person, place, and time. He appears well-developed and well-nourished.  Cardiovascular: Normal rate, normal heart sounds and intact distal pulses.  No murmur heard. Pulmonary/Chest: Effort normal and breath sounds normal. He has no wheezes. He has no rales. He exhibits no tenderness.  Abdominal: Soft. Bowel sounds are normal. He exhibits no distension and no mass. There is no tenderness.  Musculoskeletal: He exhibits tenderness (TTP of left paraspinal  muscle; positive straight leg raise bilaterally).  Neurological: He is alert and oriented to person, place, and time.  Skin: Skin is warm and dry.  Psychiatric: He has a normal mood and affect.     CMP Latest Ref Rng & Units 09/19/2017 08/22/2017 07/18/2017  Glucose 70 - 140 mg/dL 109 110 109  BUN 7 - 26 mg/dL 13 7 12.1  Creatinine 0.70 - 1.30 mg/dL 1.03 1.19 1.3  Sodium 136 - 145 mmol/L 142 142 140  Potassium 3.5 - 5.1 mmol/L 3.7 3.6 3.6  Chloride 98 - 109 mmol/L 110(H) 111(H) -  CO2 22 - 29 mmol/L _1 Calcium 8.4 - 10.4 mg/dL 8.8 9.0 9.3  Total Protein 6.4 - 8.3 g/dL 6.1(L) 6.2(L) 6.7  Total Bilirubin 0.2 - 1.2 mg/dL 0.5 0.6 0.50  Alkaline Phos 40 - 150 U/L 42 47 49  AST 5 - 34 U/L _0 ALT 0 - 55 U/L _1 Lab Results  Component Value Date   HGBA1C 6.1 10/11/2017    Assessment & Plan:   1. Prediabetes Controlled with A1c of 6.1 Continue metformin - POCT glycosylated hemoglobin (Hb A1C)  2. Multiple myeloma in relapse [facility]) Has achieved complete response to treatment as per oncology notes Continue daratumumab and Pomalyst -monthly (21 days on and 7 days off) Zometa every 3 months Follow-up with oncology  3. Bilateral sciatica Advised to apply heat Currently has tramadol; Zanaflex added to regimen - tiZANidine (ZANAFLEX) 4 MG tablet; Take 1 tablet (4 mg total) by mouth every 8 (eight) hours as needed for muscle spasms.  Dispense: 60 tablet; Refill: 0  4. Essential hypertension Controlled He needs a check of his potassium and kidney function given recent increase in dose of lisinopril however he would like blood drawn from his port which cannot be done here in the clinic. Will defer labs until his visit at the cancer center on 10/17/17 where he will have his labs drawn.   Meds ordered this encounter  Medications  . tiZANidine (ZANAFLEX) 4 MG tablet    Sig: Take 1 tablet (4 mg total) by mouth every 8 (eight) hours as needed for muscle spasms.     Dispense:  60 tablet    Refill:  0    Follow-up: Return in about 3 months (around 01/08/2018) for follow up of Hypertension and Pre Diabetes.   Charlott Rakes MD

## 2017-10-11 NOTE — Patient Instructions (Signed)

## 2017-10-17 ENCOUNTER — Ambulatory Visit (HOSPITAL_COMMUNITY)
Admission: RE | Admit: 2017-10-17 | Discharge: 2017-10-17 | Disposition: A | Discharge status: Home / Self Care | Payer: Medicaid Other | Source: Ambulatory Visit | Attending: Hematology and Oncology | Admitting: Hematology and Oncology

## 2017-10-17 ENCOUNTER — Telehealth: Payer: Self-pay | Admitting: Hematology and Oncology

## 2017-10-17 ENCOUNTER — Encounter: Payer: Self-pay | Admitting: Hematology and Oncology

## 2017-10-17 ENCOUNTER — Inpatient Hospital Stay: Payer: Medicaid Other | Attending: Hematology and Oncology

## 2017-10-17 ENCOUNTER — Inpatient Hospital Stay (HOSPITAL_BASED_OUTPATIENT_CLINIC_OR_DEPARTMENT_OTHER): Payer: Medicaid Other | Admitting: Hematology and Oncology

## 2017-10-17 ENCOUNTER — Inpatient Hospital Stay: Payer: Medicaid Other

## 2017-10-17 VITALS — BP 136/106 | HR 80 | Temp 98.2°F | Resp 18 | Ht 66.0 in | Wt 184.8 lb

## 2017-10-17 VITALS — BP 174/89 | HR 81 | Temp 97.8°F | Resp 18

## 2017-10-17 DIAGNOSIS — M47896 Other spondylosis, lumbar region: Secondary | ICD-10-CM | POA: Diagnosis not present

## 2017-10-17 DIAGNOSIS — G893 Neoplasm related pain (acute) (chronic): Secondary | ICD-10-CM

## 2017-10-17 DIAGNOSIS — Z7982 Long term (current) use of aspirin: Secondary | ICD-10-CM | POA: Diagnosis not present

## 2017-10-17 DIAGNOSIS — I10 Essential (primary) hypertension: Secondary | ICD-10-CM

## 2017-10-17 DIAGNOSIS — Z5112 Encounter for antineoplastic immunotherapy: Secondary | ICD-10-CM | POA: Diagnosis present

## 2017-10-17 DIAGNOSIS — C9002 Multiple myeloma in relapse: Secondary | ICD-10-CM | POA: Insufficient documentation

## 2017-10-17 DIAGNOSIS — C9001 Multiple myeloma in remission: Secondary | ICD-10-CM

## 2017-10-17 LAB — CBC WITH DIFFERENTIAL/PLATELET
Basophils Absolute: 0 10*3/uL (ref 0.0–0.1)
Basophils Relative: 1 %
Eosinophils Absolute: 0.4 10*3/uL (ref 0.0–0.5)
Eosinophils Relative: 8 %
HEMATOCRIT: 37.5 % — AB (ref 38.4–49.9)
HEMOGLOBIN: 13 g/dL (ref 13.0–17.1)
LYMPHS ABS: 1.9 10*3/uL (ref 0.9–3.3)
Lymphocytes Relative: 38 %
MCH: 32.3 pg (ref 27.2–33.4)
MCHC: 34.7 g/dL (ref 32.0–36.0)
MCV: 93.3 fL (ref 79.3–98.0)
MONO ABS: 0.4 10*3/uL (ref 0.1–0.9)
MONOS PCT: 8 %
NEUTROS PCT: 45 %
Neutro Abs: 2.2 10*3/uL (ref 1.5–6.5)
Platelets: 199 10*3/uL (ref 140–400)
RBC: 4.02 MIL/uL — ABNORMAL LOW (ref 4.20–5.82)
RDW: 14 % (ref 11.0–14.6)
WBC: 4.8 10*3/uL (ref 4.0–10.3)

## 2017-10-17 LAB — COMPREHENSIVE METABOLIC PANEL
ALK PHOS: 47 U/L (ref 40–150)
ALT: 24 U/L (ref 0–55)
ANION GAP: 10 (ref 3–11)
AST: 21 U/L (ref 5–34)
Albumin: 3.8 g/dL (ref 3.5–5.0)
BILIRUBIN TOTAL: 0.5 mg/dL (ref 0.2–1.2)
BUN: 12 mg/dL (ref 7–26)
CALCIUM: 9.6 mg/dL (ref 8.4–10.4)
CO2: 26 mmol/L (ref 22–29)
Chloride: 108 mmol/L (ref 98–109)
Creatinine, Ser: 1.36 mg/dL — ABNORMAL HIGH (ref 0.70–1.30)
GFR calc non Af Amer: 55 mL/min — ABNORMAL LOW (ref >60)
Glucose, Bld: 117 mg/dL (ref 70–140)
POTASSIUM: 3.7 mmol/L (ref 3.5–5.1)
Sodium: 144 mmol/L (ref 136–145)
TOTAL PROTEIN: 6.4 g/dL (ref 6.4–8.3)

## 2017-10-17 MED ORDER — FAMOTIDINE IN NACL 20-0.9 MG/50ML-% IV SOLN: 20.0000 mg | Freq: Once | INTRAVENOUS | Status: DC

## 2017-10-17 MED ORDER — ACETAMINOPHEN 325 MG PO TABS
650.0000 mg | ORAL_TABLET | Freq: Once | ORAL | Status: AC
  Administered 2017-10-17: 650 mg via ORAL

## 2017-10-17 MED ORDER — ZOLEDRONIC ACID 4 MG/100ML IV SOLN
4.0000 mg | Freq: Once | INTRAVENOUS | Status: AC
  Administered 2017-10-17: 4 mg via INTRAVENOUS
  Filled 2017-10-17: qty 100

## 2017-10-17 MED ORDER — SODIUM CHLORIDE 0.9 % IV SOLN: Freq: Once | INTRAVENOUS | Status: AC

## 2017-10-17 MED ORDER — DEXAMETHASONE SODIUM PHOSPHATE 10 MG/ML IJ SOLN
10.0000 mg | Freq: Once | INTRAMUSCULAR | Status: AC
  Administered 2017-10-17: 10 mg via INTRAVENOUS

## 2017-10-17 MED ORDER — MONTELUKAST SODIUM 10 MG PO TABS
10.0000 mg | ORAL_TABLET | Freq: Once | ORAL | Status: AC
  Administered 2017-10-17: 10 mg via ORAL

## 2017-10-17 MED ORDER — PROCHLORPERAZINE MALEATE 10 MG PO TABS
10.0000 mg | ORAL_TABLET | Freq: Once | ORAL | Status: AC
  Administered 2017-10-17: 10 mg via ORAL

## 2017-10-17 MED ORDER — SODIUM CHLORIDE 0.9 % IV SOLN
20.0000 mg | Freq: Once | INTRAVENOUS | Status: AC
  Administered 2017-10-17: 20 mg via INTRAVENOUS
  Filled 2017-10-17: qty 2

## 2017-10-17 MED ORDER — LIDOCAINE-PRILOCAINE 2.5-2.5 % EX CREA: TOPICAL_CREAM | CUTANEOUS | 3 refills | Status: AC

## 2017-10-17 MED ORDER — PROCHLORPERAZINE MALEATE 10 MG PO TABS
ORAL_TABLET | ORAL | Status: AC
  Filled 2017-10-17: qty 1

## 2017-10-17 MED ORDER — SODIUM CHLORIDE 0.9% FLUSH
10.0000 mL | Freq: Once | INTRAVENOUS | Status: AC
  Administered 2017-10-17: 10 mL
  Filled 2017-10-17: qty 10

## 2017-10-17 MED ORDER — SODIUM CHLORIDE 0.9 % IV SOLN
1200.0000 mg | Freq: Once | INTRAVENOUS | Status: AC
  Administered 2017-10-17: 1200 mg via INTRAVENOUS
  Filled 2017-10-17: qty 60

## 2017-10-17 MED ORDER — HEPARIN SOD (PORK) LOCK FLUSH 100 UNIT/ML IV SOLN
500.0000 [IU] | Freq: Once | INTRAVENOUS | Status: AC | PRN
  Administered 2017-10-17: 500 [IU]
  Filled 2017-10-17: qty 5

## 2017-10-17 MED ORDER — ACETAMINOPHEN 325 MG PO TABS
ORAL_TABLET | ORAL | Status: AC
  Filled 2017-10-17: qty 2

## 2017-10-17 MED ORDER — MONTELUKAST SODIUM 10 MG PO TABS
ORAL_TABLET | ORAL | Status: AC
  Filled 2017-10-17: qty 1

## 2017-10-17 MED ORDER — SODIUM CHLORIDE 0.9% FLUSH
10.0000 mL | INTRAVENOUS | Status: DC | PRN
  Administered 2017-10-17: 10 mL
  Filled 2017-10-17: qty 10

## 2017-10-17 MED ORDER — SODIUM CHLORIDE 0.9 % IV SOLN
Freq: Once | INTRAVENOUS | Status: AC
  Administered 2017-10-17: 10:00:00 via INTRAVENOUS

## 2017-10-17 MED ORDER — DEXAMETHASONE SODIUM PHOSPHATE 10 MG/ML IJ SOLN
INTRAMUSCULAR | Status: AC
  Filled 2017-10-17: qty 1

## 2017-10-17 NOTE — Progress Notes (Signed)
[place] OFFICE PROGRESS NOTE  Patient Care Team: Charlott Rakes, MD as PCP - General (Family Medicine) Melburn Hake, Costella Hatcher, MD as Consulting Physician (Hematology and Oncology)  ASSESSMENT & PLAN:  Multiple myeloma in relapse [facility]) Recent myeloma panel confirmed that the patient has achieved complete response to treatment He will continue monthly daratumumab with Pomalyst, to be taken for 21 days and 7 days off He has recently completed dexamethasone taper He is taking aspirin for DVT prophylaxis He is reminded to take calcium, vitamin D and will receive Zometa every 3 months He will continue antimicrobial treatment with acyclovir and aspirin for DVT prophylaxis He had no recent dental issues  Cancer associated pain He has chronic musculoskeletal pain but with acute on chronic back pain recently I will order x-ray of his lumbar spine for evaluation If x-ray is unremarkable, I will proceed with MRI imaging to exclude new compression fracture  Essential hypertension His blood pressure is elevated, could be exacerbated by pain The patient had recent excessive weight gain We discussed dietary modification   Orders Placed This Encounter  Procedures  . DG Lumbar Spine 2-3 Views    Standing Status:   Future    Standing Expiration Date:   10/17/2018    Order Specific Question:   Reason for Exam (SYMPTOM  OR DIAGNOSIS REQUIRED)    Answer:   acute on chronic lower back pain, myeloma, exclude new fractures    Order Specific Question:   Preferred imaging location?    Answer:   [facility]    Order Specific Question:   Radiology Contrast Protocol - do NOT remove file path    Answer:   [email or web address]    INTERVAL HISTORY: Please see below for problem oriented charting. He returns for further follow-up He had acute on chronic back pain recently Denies neurological deficit He is taking tramadol and muscle relaxant  without significant benefit He denies recent infection, fever or chills  SUMMARY OF ONCOLOGIC HISTORY:   Multiple myeloma in relapse ([place])   08/28/2015 Bone Marrow Biopsy    Accession: [id number] biopsy showed 58% myeloma involvement. Cytogenetics was normal and FISH showed Del 13q and t(11;14).       09/07/2015 - 01/22/2016 Chemotherapy    he received 6 cycles of treatment of Velcade and Revlimid with Dex       09/12/2015 - 09/16/2015 Hospital Admission    He was admitted to the hospital with sepsis, treatment was placed on hold and he received one dose of GCSF for leukopenia      01/12/2016 Adverse Reaction    Delay treatment due to neutropenia      01/27/2016 Imaging    ECHO at [facility] showed normal EF      01/27/2016 PET scan    PET scan at [facility] showed innumerable lytic lesions throughout      01/27/2016 Procedure    PFT is within normal limits at [facility]      01/28/2016 Bone Marrow Biopsy    Bone marrow at [facility] showed persistent plasma cell myeloma in a normocellularmarrow (30%) with 20% atypical plasma cells and myeloid hyperplasia.      02/11/2016 - 06/10/2016 Chemotherapy    He is started on salvage Rx with Kyprolis, Cytoxan and dexamethasone      03/24/2016 Imaging    US venous Doppler showed no evidence of deep vein or superficial thrombosis involving the right lower extremity and left lower extremity. No evidence of Baker&'s cyst  on the right or left.      05/18/2016 - 05/19/2016 Hospital Admission    He was admitted for chest pain evaluation. Cardiac work-up excluded cardiac events and CT angiogram were negative for PE. It showed esphageal thickening c/w reflux esophagitis likely cause for his chest discomfort      05/23/2016 - 05/25/2016 Hospital Admission    Patient was hospitalized for worsening shortness of breath and worsening pain. CT angiogram chest negative for pulmonary emboli. Patient had a recent normal 2-D echo and ruled out for MI.  Patient noted to be dehydrated. Patient admitted placed on IV fluids. Diuretic discontinued. Patient's gabapentin has been increased per recommendations from prior office visit from patient's oncologist to 600 mg 3 times daily      06/15/2016 Bone Marrow Biopsy    Bone marrow biopsy showed only 2% plasma cell, normal FISH and cytogenetics      07/26/2016 - 07/26/2016 Chemotherapy    He received high dose melphalan at [facility]       07/27/2016 Bone Marrow Transplant    He received autologous stem cell transplant at [facility]      08/05/2016 - 08/10/2016 Hospital Admission    He was admitted to [facility] for management of neutropenic fever and diarrhea. Cultures were negative. Stool culture came back positive for Novovirus and he was managed conservatively.      11/03/2016 Bone Marrow Biopsy    Bone Marrow Flow Cytometry - NO MONOCLONAL B CELL POPULATION IDENTIFIED. - PREDOMINANCE OF T CELLS WITH NON SPECIFIC CHANGES. - SEE NOTE. Diagnosis Comment: Analysis of the lymphoid population shows predominance of T lymphocytes expressing pan T-cell antigens but with relative abundance of CD8 positive cells and reversal of the CD4:CD8 ratio. No significant CD16/56 expression is identified. The T-cell changes are not considered specific in this setting. B cells represent the minor population with no monoclonality or abnormal phenotype. 4% lambda light chain restricted plasma cells. FISH positive CCND1/IGH 12% of cells.       11/09/2016 PET scan    No FDG avid myeloma is identified. Numerous osseous lytic lesions as above. 2.Hypermetabolic left level III lymph node, likely reactive etiology.  3.Ancillary CT findings as above.      11/29/2016 -  Chemotherapy    He received treatment with daratumumab and Pomalyst       12/13/2016 - 12/16/2016 Hospital Admission    He was admitted to the hospital for management of dehydration and early sepsis      01/23/2017 Procedure    Status post CT-guided bone  marrow biopsy, with tissue specimen sent to pathology for complete histopathologic analysis      01/23/2017 Bone Marrow Biopsy    Bone Marrow, Aspirate,Biopsy, and Clot - SLIGHTLY HYPOCELLULAR BONE MARROW FOR AGE WITH TRILINEAGE HEMATOPOIESIS AND 1% PLASMA CELLS. - SEE COMMENT. PERIPHERAL BLOOD: - NORMOCYTIC-NORMOCHROMIC ANEMIA. - LEUKOPENIA. Diagnosis Note The bone marrow is slightly hypocellular for age with trilineage hematopoiesis and non specific myeloid changes likely related to previous treatment. The plasma cells represent 1% of all cells in the aspirate with lack of large aggregate or sheets. To further evaluate the plasma cell component, immunohistochemical stains will be performed and the results reported in an addendum. Correlation with cytogenetic and FISH studies recommended.      01/31/2017 Adverse Reaction    Treatment was delayed due to SOB      01/31/2017 Imaging    1. No evidence of pulmonary embolism. Sensitivity is moderately degraded by respiratory motion. 2. Multiple  myeloma.  Stable compression fracture at T9. 3. Coronary artery calcifications       REVIEW OF SYSTEMS:   Constitutional: Denies fevers, chills or abnormal weight loss Eyes: Denies blurriness of vision Ears, nose, mouth, throat, and face: Denies mucositis or sore throat Respiratory: Denies cough, dyspnea or wheezes Cardiovascular: Denies palpitation, chest discomfort or lower extremity swelling Gastrointestinal:  Denies nausea, heartburn or change in bowel habits Skin: Denies abnormal skin rashes Lymphatics: Denies new lymphadenopathy or easy bruising Neurological:Denies numbness, tingling or new weaknesses Behavioral/Psych: Mood is stable, no new changes  All other systems were reviewed with the patient and are negative.  I have reviewed the past medical history, past surgical history, social history and family history with the patient and they are unchanged from previous note.  ALLERGIES:   is allergic to benadryl [diphenhydramine]; trazodone and nefazodone; and latex.  MEDICATIONS:  Current Outpatient Medications  Medication Sig Dispense Refill  . acetaminophen (TYLENOL) 500 MG tablet Take 1,000 mg by mouth every 6 (six) hours as needed for fever.    Marland Kitchen acyclovir (ZOVIRAX) 400 MG tablet Take 1 tablet (400 mg total) by mouth 2 (two) times daily. 60 tablet 6  . aluminum-magnesium hydroxide-simethicone (MAALOX) 161-096-04 MG/5ML SUSP Take 30 mLs by mouth every 4 (four) hours as needed.    Marland Kitchen aspirin 81 MG chewable tablet Chew 81 mg by mouth daily.    Marland Kitchen CALCIUM PO Take 1 tablet by mouth daily.    . cholecalciferol (VITAMIN D) 1000 units tablet Take 1,000 Units by mouth daily.     . folic acid (FOLVITE) 1 MG tablet TAKE 1 TABLET BY MOUTH ONCE DAILY 90 tablet 1  . gabapentin (NEURONTIN) 300 MG capsule Take 1-2 capsules (300-600 mg total) by mouth 2 (two) times daily. Take 300 mg in the morning and 600 mg at night. 100 capsule 9  . hydrochlorothiazide (HYDRODIURIL) 25 MG tablet Take 25 mg by mouth daily.    Marland Kitchen lidocaine-prilocaine (EMLA) cream Apply to affected area once 30 g 3  . lisinopril (PRINIVIL,ZESTRIL) 30 MG tablet Take 1 tablet (30 mg total) by mouth daily. 30 tablet 0  . loperamide (IMODIUM) 2 MG capsule Take 2 mg by mouth 4 (four) times daily as needed.    . metFORMIN (GLUCOPHAGE) 500 MG tablet Take 500 mg by mouth daily as needed.    . Multiple Vitamin (MULTIVITAMIN) tablet Take 1 tablet by mouth daily.  1  . ondansetron (ZOFRAN) 8 MG tablet Take 8 mg by mouth every 8 (eight) hours as needed.    . pomalidomide (POMALYST) 2 MG capsule Take 1 capsule (2 mg total) by mouth daily. Take with water on days 1-21. Repeat every 28 days. (Patient not taking: Reported on 10/11/2017) 21 capsule 0  . prochlorperazine (COMPAZINE) 10 MG tablet Take 10 mg by mouth every 6 (six) hours as needed.    . ranitidine (ZANTAC) 150 MG tablet TAKE 1 TABLET BY MOUTH ONCE DAILY AT BEDTIME 30 tablet 1  .  tiZANidine (ZANAFLEX) 4 MG tablet Take 1 tablet (4 mg total) by mouth every 8 (eight) hours as needed for muscle spasms. 60 tablet 0  . traMADol (ULTRAM) 50 MG tablet Take 1-2 tablets (50-100 mg total) by mouth every 6 (six) hours as needed. for pain 90 tablet 0  . zolpidem (AMBIEN) 10 MG tablet Take 1 tablet (10 mg total) by mouth at bedtime as needed for sleep. 30 tablet 3   No current facility-administered medications for this visit.  Facility-Administered Medications Ordered in Other Visits  Medication Dose Route Frequency Provider Last Rate Last Dose  . heparin lock flush 100 unit/mL  500 Units Intracatheter Once PRN Alvy Bimler, Britlee Skolnik, MD      . heparin lock flush 100 unit/mL  500 Units Intracatheter Once PRN Alvy Bimler, Logen Heintzelman, MD      . sodium chloride flush (NS) 0.9 % injection 10 mL  10 mL Intracatheter PRN Alvy Bimler, Zanaya Baize, MD   10 mL at 03/21/17 0924  . sodium chloride flush (NS) 0.9 % injection 10 mL  10 mL Intracatheter PRN Alvy Bimler, Shaneka Efaw, MD        PHYSICAL EXAMINATION: ECOG PERFORMANCE STATUS: 1 - Symptomatic but completely ambulatory  Vitals:   10/17/17 0920  BP: (!) 136/106  Pulse: 80  Resp: 18  Temp: 98.2 F (36.8 C)  SpO2: 100%   Filed Weights   10/17/17 0920  Weight: 184 lb 12.8 oz (83.8 kg)    GENERAL:alert, no distress and comfortable SKIN: skin color, texture, turgor are normal, no rashes or significant lesions EYES: normal, Conjunctiva are pink and non-injected, sclera clear OROPHARYNX:no exudate, no erythema and lips, buccal mucosa, and tongue normal  NECK: supple, thyroid normal size, non-tender, without nodularity LYMPH:  no palpable lymphadenopathy in the cervical, axillary or inguinal LUNGS: clear to auscultation and percussion with normal breathing effort HEART: regular rate & rhythm and no murmurs and no lower extremity edema ABDOMEN:abdomen soft, non-tender and normal bowel sounds Musculoskeletal:no cyanosis of digits and no clubbing  NEURO: alert & oriented x 3  with fluent speech, no focal motor/sensory deficits  LABORATORY DATA:  I have reviewed the data as listed    Component Value Date/Time   NA 144 10/17/2017 0828   NA 140 07/18/2017 0909   K 3.7 10/17/2017 0828   K 3.6 07/18/2017 0909   CL 108 10/17/2017 0828   CO2 26 10/17/2017 0828   CO2 24 07/18/2017 0909   GLUCOSE 117 10/17/2017 0828   GLUCOSE 109 07/18/2017 0909   BUN 12 10/17/2017 0828   BUN 12.1 07/18/2017 0909   CREATININE 1.36 (H) 10/17/2017 0828   CREATININE 1.3 07/18/2017 0909   CALCIUM 9.6 10/17/2017 0828   CALCIUM 9.3 07/18/2017 0909   PROT 6.4 10/17/2017 0828   PROT 6.4 07/18/2017 0909   PROT 6.7 07/18/2017 0909   ALBUMIN 3.8 10/17/2017 0828   ALBUMIN 3.9 07/18/2017 0909   AST 21 10/17/2017 0828   AST 13 07/18/2017 0909   ALT 24 10/17/2017 0828   ALT 15 07/18/2017 0909   ALKPHOS 47 10/17/2017 0828   ALKPHOS 49 07/18/2017 0909   BILITOT 0.5 10/17/2017 0828   BILITOT 0.50 07/18/2017 0909   GFRNONAA 55 (L) 10/17/2017 0828   GFRNONAA 71 07/24/2015 1155   GFRAA >60 10/17/2017 0828   GFRAA 82 07/24/2015 1155    No results found for: SPEP, UPEP  Lab Results  Component Value Date   WBC 4.8 10/17/2017   NEUTROABS 2.2 10/17/2017   HGB 13.0 10/17/2017   HCT 37.5 (L) 10/17/2017   MCV 93.3 10/17/2017   PLT 199 10/17/2017      Chemistry      Component Value Date/Time   NA 144 10/17/2017 0828   NA 140 07/18/2017 0909   K 3.7 10/17/2017 0828   K 3.6 07/18/2017 0909   CL 108 10/17/2017 0828   CO2 26 10/17/2017 0828   CO2 24 07/18/2017 0909   BUN 12 10/17/2017 0828   BUN 12.1 07/18/2017 0909  CREATININE 1.36 (H) 10/17/2017 0828   CREATININE 1.3 07/18/2017 0909      Component Value Date/Time   CALCIUM 9.6 10/17/2017 0828   CALCIUM 9.3 07/18/2017 0909   ALKPHOS 47 10/17/2017 0828   ALKPHOS 49 07/18/2017 0909   AST 21 10/17/2017 0828   AST 13 07/18/2017 0909   ALT 24 10/17/2017 0828   ALT 15 07/18/2017 0909   BILITOT 0.5 10/17/2017 0828   BILITOT  0.50 07/18/2017 0909       RADIOGRAPHIC STUDIES: I have personally reviewed the radiological images as listed and agreed with the findings in the report. Dg Shoulder Left  Result Date: 09/20/2017 CLINICAL DATA:  Left shoulder pain for 1 week, no known injury, initial encounter, history of multiple myeloma EXAM: LEFT SHOULDER - 2+ VIEW COMPARISON:  None. FINDINGS: No acute fracture or dislocation is noted. A small lucency is identified within the region of the greater tuberosity. This is only seen on the frontal film. No soft tissue abnormality is noted. IMPRESSION: Faint lucency in the proximal humerus. This may be related to the patient's underlying clinical history. No acute abnormality is noted. Electronically Signed   By: Inez Catalina M.D.   On: 09/20/2017 08:15    All questions were answered. The patient knows to call the clinic with any problems, questions or concerns. No barriers to learning was detected.  I spent 15 minutes counseling the patient face to face. The total time spent in the appointment was 20 minutes and more than 50% was on counseling and review of test results  Heath Lark, MD 10/17/2017 11:57 AM

## 2017-10-17 NOTE — Patient Instructions (Signed)
[facility] Discharge Instructions for Patients Receiving Chemotherapy  Today you received the following chemotherapy agents: Daratumumab (Darzalex).  To help prevent nausea and vomiting after your treatment, we encourage you to take your nausea medication as prescribed.    If you develop nausea and vomiting that is not controlled by your nausea medication, call the clinic.   BELOW ARE SYMPTOMS THAT SHOULD BE REPORTED IMMEDIATELY:  *FEVER GREATER THAN 100.5 F  *CHILLS WITH OR WITHOUT FEVER  NAUSEA AND VOMITING THAT IS NOT CONTROLLED WITH YOUR NAUSEA MEDICATION  *UNUSUAL SHORTNESS OF BREATH  *UNUSUAL BRUISING OR BLEEDING  TENDERNESS IN MOUTH AND THROAT WITH OR WITHOUT PRESENCE OF ULCERS  *URINARY PROBLEMS  *BOWEL PROBLEMS  UNUSUAL RASH Items with * indicate a potential emergency and should be followed up as soon as possible.  Feel free to call the clinic should you have any questions or concerns. The clinic phone number is [phone].  Please show the CHEMO ALERT CARD at check-in to the Emergency Department and triage nurse.  Zoledronic Acid injection (Hypercalcemia, Oncology) What is this medicine? ZOLEDRONIC ACID (ZOE le dron ik AS id) lowers the amount of calcium loss from bone. It is used to treat too much calcium in your blood from cancer. It is also used to prevent complications of cancer that has spread to the bone. This medicine may be used for other purposes; ask your health care provider or pharmacist if you have questions. COMMON BRAND NAME(S): Zometa What should I tell my health care provider before I take this medicine? They need to know if you have any of these conditions: -aspirin-sensitive asthma -cancer, especially if you are receiving medicines used to treat cancer -dental disease or wear dentures -infection -kidney disease -receiving corticosteroids like dexamethasone or prednisone -an unusual or allergic reaction to zoledronic acid,  other medicines, foods, dyes, or preservatives -pregnant or trying to get pregnant -breast-feeding How should I use this medicine? This medicine is for infusion into a vein. It is given by a health care professional in a hospital or clinic setting. Talk to your pediatrician regarding the use of this medicine in children. Special care may be needed. Overdosage: If you think you have taken too much of this medicine contact a poison control center or emergency room at once. NOTE: This medicine is only for you. Do not share this medicine with others. What if I miss a dose? It is important not to miss your dose. Call your doctor or health care professional if you are unable to keep an appointment. What may interact with this medicine? -certain antibiotics given by injection -NSAIDs, medicines for pain and inflammation, like ibuprofen or naproxen -some diuretics like bumetanide, furosemide -teriparatide -thalidomide This list may not describe all possible interactions. Give your health care provider a list of all the medicines, herbs, non-prescription drugs, or dietary supplements you use. Also tell them if you smoke, drink alcohol, or use illegal drugs. Some items may interact with your medicine. What should I watch for while using this medicine? Visit your doctor or health care professional for regular checkups. It may be some time before you see the benefit from this medicine. Do not stop taking your medicine unless your doctor tells you to. Your doctor may order blood tests or other tests to see how you are doing. Women should inform their doctor if they wish to become pregnant or think they might be pregnant. There is a potential for serious side effects to an unborn child.   Talk to your health care professional or pharmacist for more information. You should make sure that you get enough calcium and vitamin D while you are taking this medicine. Discuss the foods you eat and the vitamins you take  with your health care professional. Some people who take this medicine have severe bone, joint, and/or muscle pain. This medicine may also increase your risk for jaw problems or a broken thigh bone. Tell your doctor right away if you have severe pain in your jaw, bones, joints, or muscles. Tell your doctor if you have any pain that does not go away or that gets worse. Tell your dentist and dental surgeon that you are taking this medicine. You should not have major dental surgery while on this medicine. See your dentist to have a dental exam and fix any dental problems before starting this medicine. Take good care of your teeth while on this medicine. Make sure you see your dentist for regular follow-up appointments. What side effects may I notice from receiving this medicine? Side effects that you should report to your doctor or health care professional as soon as possible: -allergic reactions like skin rash, itching or hives, swelling of the face, lips, or tongue -anxiety, confusion, or depression -breathing problems -changes in vision -eye pain -feeling faint or lightheaded, falls -jaw pain, especially after dental work -mouth sores -muscle cramps, stiffness, or weakness -redness, blistering, peeling or loosening of the skin, including inside the mouth -trouble passing urine or change in the amount of urine Side effects that usually do not require medical attention (report to your doctor or health care professional if they continue or are bothersome): -bone, joint, or muscle pain -constipation -diarrhea -fever -hair loss -irritation at site where injected -loss of appetite -nausea, vomiting -stomach upset -trouble sleeping -trouble swallowing -weak or tired This list may not describe all possible side effects. Call your doctor for medical advice about side effects. You may report side effects to FDA at 1-800-FDA-1088. Where should I keep my medicine? This drug is given in a hospital  or clinic and will not be stored at home. NOTE: This sheet is a summary. It may not cover all possible information. If you have questions about this medicine, talk to your doctor, pharmacist, or health care provider.  2018 Elsevier/Gold Standard (2013-12-28 14:19:39)  

## 2017-10-17 NOTE — Progress Notes (Signed)
Maintain dose at 1200 mg despite weight change of 10.6%.  T.O. Dr Gorsuch/Tammi RN/CJones  Henreitta Leber, PharmD

## 2017-10-17 NOTE — Telephone Encounter (Signed)
Gave avs and calendar for april °

## 2017-10-17 NOTE — Assessment & Plan Note (Signed)
He has chronic musculoskeletal pain but with acute on chronic back pain recently I will order x-ray of his lumbar spine for evaluation If x-ray is unremarkable, I will proceed with MRI imaging to exclude new compression fracture

## 2017-10-17 NOTE — Assessment & Plan Note (Signed)
Recent myeloma panel confirmed that the patient has achieved complete response to treatment He will continue monthly daratumumab with Pomalyst, to be taken for 21 days and 7 days off He has recently completed dexamethasone taper He is taking aspirin for DVT prophylaxis He is reminded to take calcium, vitamin D and will receive Zometa every 3 months He will continue antimicrobial treatment with acyclovir and aspirin for DVT prophylaxis He had no recent dental issues

## 2017-10-17 NOTE — Assessment & Plan Note (Signed)
His blood pressure is elevated, could be exacerbated by pain The patient had recent excessive weight gain We discussed dietary modification

## 2017-10-18 ENCOUNTER — Other Ambulatory Visit: Payer: Self-pay | Admitting: Hematology and Oncology

## 2017-10-18 ENCOUNTER — Telehealth: Payer: Self-pay

## 2017-10-18 DIAGNOSIS — C9002 Multiple myeloma in relapse: Secondary | ICD-10-CM

## 2017-10-18 DIAGNOSIS — G893 Neoplasm related pain (acute) (chronic): Secondary | ICD-10-CM

## 2017-10-18 LAB — KAPPA/LAMBDA LIGHT CHAINS
KAPPA, LAMDA LIGHT CHAIN RATIO: 1.4 (ref 0.26–1.65)
Kappa free light chain: 3.5 mg/L (ref 3.3–19.4)
Lambda free light chains: 2.5 mg/L — ABNORMAL LOW (ref 5.7–26.3)

## 2017-10-18 NOTE — Telephone Encounter (Signed)
-----   Message from Heath Lark, MD sent at 10/18/2017  8:31 AM EST ----- Regarding: X ray Tell him X ray lower back showed degenerative disc disease. I recommend weight loss and PT, no need further imaging If he needs PT referral, please ask where is the nearest facility to him.  Otherwise, all my patients get referred to cancer rehab program near [facility] ----- Message ----- From: Interface, Rad Results In Sent: 10/17/2017   5:15 PM To: Heath Lark, MD

## 2017-10-18 NOTE — Telephone Encounter (Signed)
I placed referral.

## 2017-10-18 NOTE — Telephone Encounter (Signed)
Called with below message. Verbalized understanding. He would like the PT referral to go to the cancer rehab program near [facility].

## 2017-10-19 LAB — MULTIPLE MYELOMA PANEL, SERUM
ALBUMIN SERPL ELPH-MCNC: 3.4 g/dL (ref 2.9–4.4)
ALPHA 1: 0.2 g/dL (ref 0.0–0.4)
ALPHA2 GLOB SERPL ELPH-MCNC: 0.8 g/dL (ref 0.4–1.0)
Albumin/Glob SerPl: 1.4 (ref 0.7–1.7)
B-Globulin SerPl Elph-Mcnc: 0.9 g/dL (ref 0.7–1.3)
Gamma Glob SerPl Elph-Mcnc: 0.5 g/dL (ref 0.4–1.8)
Globulin, Total: 2.5 g/dL (ref 2.2–3.9)
IGG (IMMUNOGLOBIN G), SERUM: 555 mg/dL — AB (ref 700–1600)
IGM (IMMUNOGLOBULIN M), SRM: 9 mg/dL — AB (ref 20–172)
IgA: 41 mg/dL — ABNORMAL LOW (ref 61–437)
TOTAL PROTEIN ELP: 5.9 g/dL — AB (ref 6.0–8.5)

## 2017-10-23 ENCOUNTER — Other Ambulatory Visit: Payer: Self-pay

## 2017-10-23 ENCOUNTER — Encounter: Payer: Self-pay | Admitting: Physical Therapy

## 2017-10-23 ENCOUNTER — Ambulatory Visit: Payer: Medicaid Other | Attending: Hematology and Oncology | Admitting: Physical Therapy

## 2017-10-23 DIAGNOSIS — R293 Abnormal posture: Secondary | ICD-10-CM | POA: Diagnosis not present

## 2017-10-23 DIAGNOSIS — M545 Low back pain, unspecified: Secondary | ICD-10-CM

## 2017-10-23 DIAGNOSIS — M6281 Muscle weakness (generalized): Secondary | ICD-10-CM | POA: Insufficient documentation

## 2017-10-23 NOTE — Therapy (Signed)
[place], [place], [address] Phone: [phone]   Fax:  [phone]  Physical Therapy Evaluation  Patient Details  Name: Erik Nguyen MRN: [id number] Date of Birth: 07/02/1956 Referring Provider: Dr. Alvy Bimler    Encounter Date: 10/23/2017  PT End of Session - 10/23/17 1305    Visit Number  1    Number of Visits  4    Date for PT Re-Evaluation  11/13/17    Authorization Type  Medicaid     Authorization Time Period  3 weeks     Authorization - Number of Visits  3    PT Start Time  1015    PT Stop Time  1100    PT Time Calculation (min)  45 min    Activity Tolerance  Patient tolerated treatment well    Behavior During Therapy  [facility] for tasks assessed/performed       Past Medical History:  Diagnosis Date  . Dyspnea    exertion   . Encounter for antineoplastic chemotherapy 02/09/2016  . GERD (gastroesophageal reflux disease)   . History of syphilis 02/24/2016  . Hypertension Dx 2016  . Multiple myeloma not having achieved remission ([place]) 09/01/2015  . Pre-diabetes   . Priapism 2014     Past Surgical History:  Procedure Laterality Date  . COLONOSCOPY    . POLYPECTOMY      There were no vitals filed for this visit.   Subjective Assessment - 10/23/17 1031    Subjective  Pt has back pain that would come and go but for the last 3 or 4 weeks it has been constant   He has noticed a recent weight gain and gets short of breath when he tries to bend over to ties his shoes     Pertinent History  Muliple myeloma with  bone marrow transplant 07/27/2016 and has had complete respone to treatment     Limitations  Sitting;Lifting;Standing;Walking;House hold activities    Patient Stated Goals  To get some relief from his back pain .    Currently in Pain?  Yes    Pain Score  7     Pain Location  Back    Pain Orientation  Mid;Lower    Pain Descriptors / Indicators  Aching;Dull;Sharp varies, worse when goes  to get up     Pain Type  Chronic pain    Pain Radiating Towards  no     Pain Onset  1 to 4 weeks ago    Pain Frequency  Constant    Aggravating Factors   changing positions     Pain Relieving Factors  sit     Effect of Pain on Daily Activities  cant stand long to do the dishes          [facility] PT Assessment - 10/23/17 0001      Assessment   Medical Diagnosis  multiple myeloma in remission     Referring Provider  Dr. Alvy Bimler     Onset Date/Surgical Date  07/27/16 bone marrow transplant     Hand Dominance  Left      Balance Screen   Has the patient fallen in the past 6 months  No    Has the patient had a decrease in activity level because of a fear of falling?   No    Is the patient reluctant to leave their home because of a fear of falling?   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 He has firm abdomen anteriorly       Coordination   Gross Motor Movements are Fluid and Coordinated  Yes      Functional Tests   Functional tests  Sit to Stand      Sit to Stand   Comments  12 reps in 30 sec.  pt reports leg fatigue       Posture/Postural Control   Posture/Postural Control  Postural limitations    Postural Limitations  Increased lumbar lordosis    Posture Comments  limited  lumbar mobility       AROM   Overall AROM   Deficits generalized "stiffness"     Overall AROM Comments  pt moves slowly and groans with all lumbar mobility     Lumbar Flexion  limited to 50%     Lumbar Extension  limited to 50%     Lumbar - Right Side Bend  limited to 50%     Lumbar - Left Side Bend  limiited to 50%    Lumbar - Right Rotation  limited to 50%     Lumbar - Left Rotation  limited to 50%       Strength   Overall Strength  Deficits    Overall Strength Comments  pt reports he is limited by pain  He does not have speciific weakness to isolaated isometric testing, but is limited functionally     Right Hand Grip (lbs)  80/80/75    Left Hand Grip (lbs)  90/85/85      Flexibility   Soft Tissue Assessment /Muscle Length  yes    Hamstrings  pt reports tightness in the back of his legs with forward bending but he has SLR to approx > 70 degrees bilaterally       Palpation   Palpation comment  Incereased muscle spasm in lumbar paravertebrals muscles       Bed Mobility   Bed Mobility  Rolling Right;Rolling Left;Right Sidelying to Sit pt grunts and moves slowly but is able to perform task     Rolling Right  6: Modified independent (Device/Increase time)    Rolling Left  6: Modified independent (Device/Increase time)    Right Sidelying to Sit  6: Modified independent (Device/Increase time)      Transfers   Transfers  Sit to Stand;Stand to Sit    Sit to Stand  6: Modified independent (Device/Increase time) grunts, grimaces and moves slowly     Stand to Sit  6: Modified independent (Device/Increase time) uses hands to control descent       Ambulation/Gait   Gait velocity  5 meters in 4.99 sec  Standardized Balance Assessment   Standardized Balance Assessment  Timed Up and Go Test      Timed Up and Go Test   Normal TUG (seconds)  9.45    TUG Comments  Pt showed some stiffness when standing up from sitting              Objective measurements completed on  examination: See above findings.                   PT Long Term Goals - 10/23/17 1640      PT LONG TERM GOAL #1   Title  Pt will report that his standing tolerance is increased so that he can do the dishes at home     Baseline  Pt cannot stand long enough to do the dishes    Time  3    Period  Weeks    Status  New      PT LONG TERM GOAL #2   Title  Pt will be indedependent in a home exericise program for lumbar stretching and core strengthening     Baseline  no knowledge    Time  3    Period  Weeks    Status  New      PT LONG TERM GOAL #3   Title  Pt will report the pain in his low back is decreased to a 3/10     Baseline  7/10    Time  3    Period  Weeks      PT LONG TERM GOAL #4   Title  Pt will decrease his TUG  to < 8 sec     Baseline  9.45 sec     Time  3    Period  Weeks    Status  New             Plan - 10/23/17 1633    Clinical Impression Statement  Pt has had long course of treatment for multiple myeloma and has had limited exercise and activity since then. He has had recent weight gain with increase in abdominal girth and is now limited by pack pain.  He has muscle spasm and decreased lumbar range of motion as well as general decondiitioning.     History and Personal Factors relevant to plan of care:  past history of multiple myeloma with stem cell transplant.     Clinical Presentation  Stable    Clinical Decision Making  Low    Rehab Potential  Good    Clinical Impairments Affecting Rehab Potential  perception that activity may cause injury due to previous bone "softening" from multipe myeloma     PT Frequency  1x / week    PT Duration  3 weeks    PT Treatment/Interventions  ADLs/Self Care Home Management;Balance training;Moist Heat;Therapeutic exercise;Therapeutic activities;Patient/family education;Manual techniques;Taping;Electrical Stimulation    PT Next Visit Plan  Brief moist heat pack and soft tissue work to low back. teach core  stabalization exercises and lumbar ROM and stretching, consider treadmill work toward community exercise     Consulted and Agree with Plan of Care  Patient       Patient will benefit from skilled therapeutic intervention in order to improve the following deficits and impairments:  Abnormal gait, Increased fascial restricitons, Pain, Improper body mechanics, Postural dysfunction, Increased muscle spasms, Decreased mobility, Decreased activity tolerance, Decreased endurance, Decreased strength, Impaired perceived functional ability, Obesity, Difficulty walking  Visit Diagnosis: Abnormal posture - Plan: PT  plan of care cert/re-cert  Acute midline low back pain without sciatica - Plan: PT plan of care cert/re-cert  Muscle weakness (generalized) - Plan: PT plan of care cert/re-cert     Problem List Patient Active Problem List   Diagnosis Date Noted  . Acute pain of left shoulder 09/19/2017  . Cataracts, bilateral 07/18/2017  . Prediabetes 01/05/2017  . Physical deconditioning 01/03/2017  . Cough in adult 12/20/2016  . Acute bronchitis 12/13/2016  . Blood glucose elevated 12/13/2016  . Sepsis ([place]) 12/09/2016  . Goals of care, counseling/discussion 11/18/2016  . S/P autologous bone marrow transplantation ([place]) 08/12/2016  . Mild protein-calorie malnutrition ([place]) 08/12/2016  . Dysphagia 08/12/2016  . Essential hypertension 05/26/2016  . Hypoxia 05/24/2016  . Dehydration 05/23/2016  . Chest pain 05/18/2016  . GERD (gastroesophageal reflux disease) 05/18/2016  . Blepharoconjunctivitis of both eyes 04/21/2016  . Dyspnea 03/25/2016  . Left leg pain 03/24/2016  . Port catheter in place 02/25/2016  . Pancytopenia due to antineoplastic chemotherapy ([place]) 02/25/2016  . History of syphilis 02/24/2016  . Encounter for antineoplastic chemotherapy 02/09/2016  . Skin rash 12/14/2015  . 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 Anemia due to antineoplastic chemotherapy  09/22/2015  . Antineoplastic chemotherapy induced pancytopenia ([place])   . Bone pain   . Cancer associated pain 09/02/2015  . Multiple myeloma in relapse ([place]) 09/01/2015  . 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 Vitamin D deficiency 07/22/2015  . Closed right clavicular fracture 07/17/2015  . Chest wall pain 03/12/2015  . Thoracolumbar back pain 03/12/2015  . Allergic rhinitis 03/12/2015  . Poor dentition 01/15/2015  . Nerve damage 01/15/2015  . Esophagitis 12/28/2014  . Lower GI bleed   . Hemorrhoids 11/06/2014  . HTN (hypertension) 11/06/2014  . Priapism 11/06/2014  . Decreased visual acuity 11/06/2014  . Family history of prostate cancer in father 11/06/2014   Donato Heinz. Owens Shark PT  Norwood Levo 10/23/2017, 4:46 PM  [place], [place], [address] Phone: [phone]   Fax:  [phone]  Name: Erik Nguyen MRN: [id number] Date of Birth: [birth year]

## 2017-10-30 ENCOUNTER — Ambulatory Visit: Payer: Medicaid Other

## 2017-10-30 DIAGNOSIS — R293 Abnormal posture: Secondary | ICD-10-CM | POA: Diagnosis not present

## 2017-10-30 DIAGNOSIS — M545 Low back pain, unspecified: Secondary | ICD-10-CM

## 2017-10-30 DIAGNOSIS — M6281 Muscle weakness (generalized): Secondary | ICD-10-CM

## 2017-10-30 NOTE — Patient Instructions (Addendum)
Knee to Chest     Cancer Rehab [phone]    Lying supine, bend involved knee to chest _2-3__ times, hold 20-30 sec. Repeat with other leg.  Do _3__ times per day.  Knee-to-Chest Stretch: Bilateral    Bringing one knee to chest at a time. With hands behind knees, pull both knees in to chest until a comfortable stretch is felt in lower back and buttocks. Keep back relaxed. Hold _20-30___ seconds. Repeat __2-3__ times per set. Do __3__ sessions per day.  Hamstring Stretch: Active    Place long towel or belt over ball of foot. Starting with knee bent, attempt to straighten knee until a comfortable stretch is felt in back of thigh. Hold _20-30___ seconds. Repeat _2-3___ times per set. Do __3__ sessions per day.  PELVIC TILT: Posterior    Tighten abdominals, flatten low back. _10__ reps holding 5 seconds. 2 times a day.  Then holding pelvic tilt and : 1. Open/close knees  2. Alternate march 3. Alternate straightening leg  10 times each, or 2-3 sets of 5 each.  Bridging    Slowly raise buttocks from floor, keeping stomach tight. Repeat __10__ times per set, holding 3-5 seconds. 1-2 sets per session. Do __2__ sessions per day.  Lower Trunk Rotation Stretch    Keeping back flat and feet together, bringing arms out to side, rotate knees to left side. Hold __20-30__ seconds. Repeat __2-3__ times. Then do right side. Do __2-3__ sessions per day.   Piriformis Stretch, Sitting    Only do this once hamstring flexibility improved! Sit, one ankle on opposite knee, same-side hand on crossed knee. Push down on knee, keeping spine straight. Lean torso forward, with flat back, until tension is felt in hamstrings and gluteals of crossed-leg side. Hold _20-30__ seconds.  Repeat _2-3__ times per session. Do _3__ sessions per day.  Copyright  VHI. All rights reserved.

## 2017-10-30 NOTE — Therapy (Signed)
[place], [place], [address] Phone: [phone]   Fax:  [phone]  Physical Therapy Treatment  Patient Details  Name: Erik Nguyen MRN: [id number] Date of Birth: 1955/10/25 Referring Provider: Dr. Alvy Bimler    Encounter Date: 10/30/2017  PT End of Session - 10/30/17 0949    Visit Number  2    Number of Visits  4    Date for PT Re-Evaluation  11/13/17    Authorization Type  Medicaid     Authorization Time Period  3 weeks     Authorization - Number of Visits  3    PT Start Time  [phone]    PT Stop Time  0933    PT Time Calculation (min)  42 min    Activity Tolerance  Patient tolerated treatment well    Behavior During Therapy  [facility] for tasks assessed/performed       Past Medical History:  Diagnosis Date  . Dyspnea    exertion   . Encounter for antineoplastic chemotherapy 02/09/2016  . GERD (gastroesophageal reflux disease)   . History of syphilis 02/24/2016  . Hypertension Dx 2016  . Multiple myeloma not having achieved remission ([place]) 09/01/2015  . Pre-diabetes   . Priapism 2014     Past Surgical History:  Procedure Laterality Date  . COLONOSCOPY    . POLYPECTOMY      There were no vitals filed for this visit.  Subjective Assessment - 10/30/17 0852    Subjective  My back is feeling a little better today. I've been doing the pelvic tilts she showed me last time.     Pertinent History  Muliple myeloma with  bone marrow transplant 07/27/2016 and has had complete respone to treatment     Limitations  Sitting;Lifting;Standing;Walking;House hold activities    Patient Stated Goals  To get some relief from his back pain .    Currently in Pain?  Yes    Pain Score  4     Pain Location  Back    Pain Orientation  Mid;Lower    Pain Descriptors / Indicators  Tightness    Pain Type  Chronic pain    Pain Onset  1 to 4 weeks ago    Aggravating Factors   nothing in past few days     Pain Relieving  Factors  pelvic tilts (normally pain meds and muscle relaxer but hasn't neededthese in past few days)                      [facility] Adult PT Treatment/Exercise - 10/30/17 0001      Lumbar Exercises: Stretches   Passive Hamstring Stretch  Right;Left;2 reps;20 seconds with towel at ball of foot; then also done in sittiing    Single Knee to Chest Stretch  Right;Left;2 reps;30 seconds    Double Knee to Chest Stretch  2 reps;30 seconds    Lower Trunk Rotation  2 reps;20 seconds    Pelvic Tilt  10 reps;5 seconds    Lumbar Stabilization Level 1  --    Piriformis Stretch  Right;Left;1 rep;10 seconds    Piriformis Stretch Limitations  Pt very tight bilaterally so unable to hold this long, suggested pt not do this at home yet      Lumbar Exercises: Supine   Pelvic Tilt  10 reps;5 seconds    Clam  5 reps 2 sets of 5, with pelvic tilt    Heel Slides  5 reps  2 sets of 5, with pelvic tilt    Bent Knee Raise  5 reps 2 sets of 5 with pelvic tilt    Bridge  10 reps;3 seconds      Modalities   Modalities  Moist Heat      Moist Heat Therapy   Number Minutes Moist Heat  30 Minutes Laayed on heat for supine stretches    Moist Heat Location  Lumbar Spine             PT Education - 10/30/17 0943    Education provided  Yes    Education Details  Lumbar and hip flexibility and core stabilizations    Person(s) Educated  Patient    Methods  Explanation;Demonstration;Handout    Comprehension  Verbalized understanding;Returned demonstration;Need further instruction          PT Long Term Goals - 10/23/17 1640      PT LONG TERM GOAL #1   Title  Pt will report that his standing tolerance is increased so that he can do the dishes at home     Baseline  Pt cannot stand long enough to do the dishes    Time  3    Period  Weeks    Status  New      PT LONG TERM GOAL #2   Title  Pt will be indedependent in a home exericise program for lumbar stretching and core strengthening      Baseline  no knowledge    Time  3    Period  Weeks    Status  New      PT LONG TERM GOAL #3   Title  Pt will report the pain in his low back is decreased to a 3/10     Baseline  7/10    Time  3    Period  Weeks      PT LONG TERM GOAL #4   Title  Pt will decrease his TUG  to < 8 sec     Baseline  9.45 sec     Time  3    Period  Weeks    Status  New            Plan - 10/30/17 2409    Clinical Impression Statement  Pt tolerated first session of lumbar exercises very well, just had some limitation (with piriformis stretching) due to tight hamstring. Nothing increased pain. Added all exercises to HEP.     Rehab Potential  Good    Clinical Impairments Affecting Rehab Potential  perception that activity may cause injury due to previous bone "softening" from multipe myeloma     PT Frequency  1x / week    PT Duration  3 weeks    PT Treatment/Interventions  ADLs/Self Care Home Management;Balance training;Moist Heat;Therapeutic exercise;Therapeutic activities;Patient/family education;Manual techniques;Taping;Electrical Stimulation    PT Next Visit Plan  Brief moist heat pack and soft tissue work to low back. try pelvic tilt on ball and review/cont core stabalization exercises and lumbar ROM and stretching, consider treadmill work toward community exercise     Consulted and Agree with Plan of Care  Patient       Patient will benefit from skilled therapeutic intervention in order to improve the following deficits and impairments:  Abnormal gait, Increased fascial restricitons, Pain, Improper body mechanics, Postural dysfunction, Increased muscle spasms, Decreased mobility, Decreased activity tolerance, Decreased endurance, Decreased strength, Impaired perceived functional ability, Obesity, Difficulty walking  Visit Diagnosis: Abnormal posture  Acute midline  low back pain without sciatica  Muscle weakness (generalized)     Problem List Patient Active Problem List   Diagnosis Date  Noted  . Acute pain of left shoulder 09/19/2017  . Cataracts, bilateral 07/18/2017  . Prediabetes 01/05/2017  . Physical deconditioning 01/03/2017  . Cough in adult 12/20/2016  . Acute bronchitis 12/13/2016  . Blood glucose elevated 12/13/2016  . Sepsis ([place]) 12/09/2016  . Goals of care, counseling/discussion 11/18/2016  . S/P autologous bone marrow transplantation ([place]) 08/12/2016  . Mild protein-calorie malnutrition ([place]) 08/12/2016  . Dysphagia 08/12/2016  . Essential hypertension 05/26/2016  . Hypoxia 05/24/2016  . Dehydration 05/23/2016  . Chest pain 05/18/2016  . GERD (gastroesophageal reflux disease) 05/18/2016  . Blepharoconjunctivitis of both eyes 04/21/2016  . Dyspnea 03/25/2016  . Left leg pain 03/24/2016  . Port catheter in place 02/25/2016  . Pancytopenia due to antineoplastic chemotherapy ([place]) 02/25/2016  . History of syphilis 02/24/2016  . Encounter for antineoplastic chemotherapy 02/09/2016  . Skin rash 12/14/2015  . 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 Anemia due to antineoplastic chemotherapy 09/22/2015  . Antineoplastic chemotherapy induced pancytopenia ([place])   . Bone pain   . Cancer associated pain 09/02/2015  . Multiple myeloma in relapse ([place]) 09/01/2015  . 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 Vitamin D deficiency 07/22/2015  . Closed right clavicular fracture 07/17/2015  . Chest wall pain 03/12/2015  . Thoracolumbar back pain 03/12/2015  . Allergic rhinitis 03/12/2015  . Poor dentition 01/15/2015  . Nerve damage 01/15/2015  . Esophagitis 12/28/2014  . Lower GI bleed   . Hemorrhoids 11/06/2014  . HTN (hypertension) 11/06/2014  . Priapism 11/06/2014  . Decreased visual acuity 11/06/2014  . Family history of prostate cancer in father 11/06/2014    Otelia Limes, PTA 10/30/2017, 9:55 AM  [place], [place], [address] Phone:  [phone]   Fax:  [phone]  Name: Bharat Antillon MRN: [id number] Date of Birth: [birth year]

## 2017-11-06 ENCOUNTER — Ambulatory Visit: Payer: Medicaid Other

## 2017-11-06 DIAGNOSIS — M545 Low back pain, unspecified: Secondary | ICD-10-CM

## 2017-11-06 DIAGNOSIS — R293 Abnormal posture: Secondary | ICD-10-CM | POA: Diagnosis not present

## 2017-11-06 DIAGNOSIS — M6281 Muscle weakness (generalized): Secondary | ICD-10-CM

## 2017-11-06 NOTE — Therapy (Signed)
[place], [place], [address] Phone: [phone]   Fax:  [phone]  Physical Therapy Treatment  Patient Details  Name: Erik Nguyen MRN: [id number] Date of Birth: 21-Apr-1956 Referring Provider: Dr. Alvy Bimler    Encounter Date: 11/06/2017  PT End of Session - 11/06/17 0905    Visit Number  3    Number of Visits  4    Date for PT Re-Evaluation  11/13/17    Authorization Type  Medicaid     PT Start Time  0851    PT Stop Time  0932    PT Time Calculation (min)  41 min    Activity Tolerance  Patient tolerated treatment well    Behavior During Therapy  [facility] for tasks assessed/performed       Past Medical History:  Diagnosis Date  . Dyspnea    exertion   . Encounter for antineoplastic chemotherapy 02/09/2016  . GERD (gastroesophageal reflux disease)   . History of syphilis 02/24/2016  . Hypertension Dx 2016  . Multiple myeloma not having achieved remission ([place]) 09/01/2015  . Pre-diabetes   . Priapism 2014     Past Surgical History:  Procedure Laterality Date  . COLONOSCOPY    . POLYPECTOMY      There were no vitals filed for this visit.  Subjective Assessment - 11/06/17 0854    Subjective  Been doing the stretches over the weekend you sowed me last time. I can't tell if they are helping yet or not but I'm doing them. The crossing my leg one (piriformis stretch) is still really hard and I can't hold it long. My back isn't hurting today, it's just really stiff and tight.     Pertinent History  Muliple myeloma with  bone marrow transplant 07/27/2016 and has had complete respone to treatment     Patient Stated Goals  To get some relief from his back pain .    Currently in Pain?  No/denies                No data recorded       [facility] Adult PT Treatment/Exercise - 11/06/17 0001      Lumbar Exercises: Stretches   Passive Hamstring Stretch  Right;Left;2 reps;20 seconds    Single Knee  to Chest Stretch  Right;Left;2 reps;30 seconds    Double Knee to Chest Stretch  2 reps;20 seconds    Lower Trunk Rotation  2 reps;30 seconds bil      Lumbar Exercises: Aerobic   Stationary Bike  Level 3, x 4 mins      Lumbar Exercises: Seated   Other Seated Lumbar Exercises  On large ball for pelvic tilts front to back then bilateral tilts instructing pt to only use lower abdominal and back muscles and to relax upper thoracic muscles. Pt able to return correct demonstration after multiple trials, then bil circles on ball; also with core engaged alternate marching 5x, then alternate UE flexion 5x.      Moist Heat Therapy   Number Minutes Moist Heat  -- During supine stretches/exercises    Moist Heat Location  Lumbar Spine      Manual Therapy   Manual Therapy  Soft tissue mobilization    Soft tissue mobilization  Pt in Lt S/L (reports unable to lay prone due to stomach issues) for STM to bil lumbar muscles                  PT Long  Term Goals - 10/23/17 1640      PT LONG TERM GOAL #1   Title  Pt will report that his standing tolerance is increased so that he can do the dishes at home     Baseline  Pt cannot stand long enough to do the dishes    Time  3    Period  Weeks    Status  New      PT LONG TERM GOAL #2   Title  Pt will be indedependent in a home exericise program for lumbar stretching and core strengthening     Baseline  no knowledge    Time  3    Period  Weeks    Status  New      PT LONG TERM GOAL #3   Title  Pt will report the pain in his low back is decreased to a 3/10     Baseline  7/10    Time  3    Period  Weeks      PT LONG TERM GOAL #4   Title  Pt will decrease his TUG  to < 8 sec     Baseline  9.45 sec     Time  3    Period  Weeks    Status  New            Plan - 11/06/17 1751    Clinical Impression Statement  PT initially reported not noticing his pain much improved but did say he wasn't in pain this morning, was just really  tight/stiff. He has been very compliant with HEP thus far and did well with review of these today. Also incorporated manul therapy to work on decreasing tight muscles for pt. Pt reported feeling much looser at end of session.     Rehab Potential  Good    Clinical Impairments Affecting Rehab Potential  perception that activity may cause injury due to previous bone "softening" from multipe myeloma     PT Frequency  1x / week    PT Duration  3 weeks    PT Treatment/Interventions  ADLs/Self Care Home Management;Balance training;Moist Heat;Therapeutic exercise;Therapeutic activities;Patient/family education;Manual techniques;Taping;Electrical Stimulation    PT Next Visit Plan  Brief moist heat pack and soft tissue work to low back. Cont pelvic tilt on ball and review/cont core stabalization exercises and lumbar ROM and stretching, consider treadmill work toward community exercise ; Arts development officer education    Consulted and Agree with Plan of Care  Patient       Patient will benefit from skilled therapeutic intervention in order to improve the following deficits and impairments:  Abnormal gait, Increased fascial restricitons, Pain, Improper body mechanics, Postural dysfunction, Increased muscle spasms, Decreased mobility, Decreased activity tolerance, Decreased endurance, Decreased strength, Impaired perceived functional ability, Obesity, Difficulty walking  Visit Diagnosis: Abnormal posture  Acute midline low back pain without sciatica  Muscle weakness (generalized)     Problem List Patient Active Problem List   Diagnosis Date Noted  . Acute pain of left shoulder 09/19/2017  . Cataracts, bilateral 07/18/2017  . Prediabetes 01/05/2017  . Physical deconditioning 01/03/2017  . Cough in adult 12/20/2016  . Acute bronchitis 12/13/2016  . Blood glucose elevated 12/13/2016  . Sepsis ([place]) 12/09/2016  . Goals of care, counseling/discussion 11/18/2016  . S/P autologous bone marrow transplantation  ([place]) 08/12/2016  . Mild protein-calorie malnutrition ([place]) 08/12/2016  . Dysphagia 08/12/2016  . Essential hypertension 05/26/2016  . Hypoxia 05/24/2016  . Dehydration 05/23/2016  .  Chest pain 05/18/2016  . GERD (gastroesophageal reflux disease) 05/18/2016  . Blepharoconjunctivitis of both eyes 04/21/2016  . Dyspnea 03/25/2016  . Left leg pain 03/24/2016  . Port catheter in place 02/25/2016  . Pancytopenia due to antineoplastic chemotherapy ([place]) 02/25/2016  . History of syphilis 02/24/2016  . Encounter for antineoplastic chemotherapy 02/09/2016  . Skin rash 12/14/2015  . 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 Anemia due to antineoplastic chemotherapy 09/22/2015  . Antineoplastic chemotherapy induced pancytopenia ([place])   . Bone pain   . Cancer associated pain 09/02/2015  . Multiple myeloma in relapse ([place]) 09/01/2015  . 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 Vitamin D deficiency 07/22/2015  . Closed right clavicular fracture 07/17/2015  . Chest wall pain 03/12/2015  . Thoracolumbar back pain 03/12/2015  . Allergic rhinitis 03/12/2015  . Poor dentition 01/15/2015  . Nerve damage 01/15/2015  . Esophagitis 12/28/2014  . Lower GI bleed   . Hemorrhoids 11/06/2014  . HTN (hypertension) 11/06/2014  . Priapism 11/06/2014  . Decreased visual acuity 11/06/2014  . Family history of prostate cancer in father 11/06/2014    Otelia Limes, PTA 11/06/2017, 9:36 AM  [place], [place], [address] Phone: [phone]   Fax:  [phone]  Name: Erik Nguyen MRN: [id number] Date of Birth: 11/25/1955

## 2017-11-13 ENCOUNTER — Ambulatory Visit: Payer: Medicaid Other | Attending: Hematology and Oncology

## 2017-11-13 DIAGNOSIS — M6281 Muscle weakness (generalized): Secondary | ICD-10-CM | POA: Insufficient documentation

## 2017-11-13 DIAGNOSIS — M545 Low back pain, unspecified: Secondary | ICD-10-CM

## 2017-11-13 DIAGNOSIS — R293 Abnormal posture: Secondary | ICD-10-CM | POA: Insufficient documentation

## 2017-11-13 NOTE — Patient Instructions (Signed)
Bridging    Slowly raise buttocks from floor, keeping stomach tight. Repeat __10__ times per set holding 3-5 seconds.. Do __1-2__ sets per session. Do __2__ sessions per day.  Then hold bridge and squeeze pillow between knees 5-10 times.   Cancer Rehab [phone]

## 2017-11-13 NOTE — Therapy (Signed)
[place], [place], [address] Phone: [phone]   Fax:  [phone]  Physical Therapy Treatment  Patient Details  Name: Erik Nguyen MRN: [id number] Date of Birth: [birth year] Referring Provider: Dr. Alvy Bimler    Encounter Date: 11/13/2017  PT End of Session - 11/13/17 0936    Visit Number  4    Number of Visits  4    Date for PT Re-Evaluation  11/13/17    Authorization Type  Medicaid     PT Start Time  0835    PT Stop Time  0926    PT Time Calculation (min)  51 min    Activity Tolerance  Patient tolerated treatment well    Behavior During Therapy  [facility] for tasks assessed/performed       Past Medical History:  Diagnosis Date  . Dyspnea    exertion   . Encounter for antineoplastic chemotherapy 02/09/2016  . GERD (gastroesophageal reflux disease)   . History of syphilis 02/24/2016  . Hypertension Dx 2016  . Multiple myeloma not having achieved remission ([place]) 09/01/2015  . Pre-diabetes   . Priapism 2014     Past Surgical History:  Procedure Laterality Date  . COLONOSCOPY    . POLYPECTOMY      There were no vitals filed for this visit.  Subjective Assessment - 11/13/17 0837    Subjective  I'm definitely noticing an improvement in my LBP. When I do the stretches and core exs you gave me my pain is more just stiffness. I didn't do them yesterday and can tell this morning, my pain is present but now it's just a dull ache. I am able to stand better at the counter for dishes and brushing my teeth. I used to have to support myself with  my arm, but now I can stand without that and though pain is still present it's a dull, ache now, not sharp and shooting.    Pertinent History  Muliple myeloma with  bone marrow transplant 07/27/2016 and has had complete respone to treatment     Patient Stated Goals  To get some relief from his back pain .    Currently in Pain?  Yes    Pain Score  6     Pain Location   Back    Pain Orientation  Mid;Lower    Pain Descriptors / Indicators  Aching;Dull    Pain Type  Chronic pain    Pain Onset  More than a month ago    Pain Frequency  Intermittent    Aggravating Factors   when I let myself get stiff    Pain Relieving Factors  doing the HEP                No data recorded       [facility] Adult PT Treatment/Exercise - 11/13/17 0001      Lumbar Exercises: Stretches   Passive Hamstring Stretch  Right;Left;2 reps;20 seconds    Single Knee to Chest Stretch  Right;Left;2 reps;20 seconds    Lower Trunk Rotation  2 reps;20 seconds bil    Piriformis Stretch  Right;Left;2 reps;20 seconds    Piriformis Stretch Limitations  Therapist did this for pt as he is still very tight with this      Lumbar Exercises: Aerobic   Stationary Bike  Level 3, x 3 mins      Lumbar Exercises: Seated   Other Seated Lumbar Exercises  With therapist siting behind pt for  safety, pt on large ball for pelvic tilts front to back then bilateral side weight shifts, pt able to return excellent demo with just min VCs initally to remind him of correct technique; also bil circles on ball; also with core engaged alternate marching, then alt LAQ 10x each, then alternate UE flexion holding 1 lb, then alternate abduction, 10x each; also 1x of pt sitting on roll to walk feet away from ball until only his scapulae are on ball, then walk feet back up until in sitting again, SBA throughout      Lumbar Exercises: Supine   Bridge  10 reps;3 seconds    Bridge with [employer]  5 reps 2 sets of 5 using purple ball, no pain with this    Bridge with clamshell  5 reps Last rep pt felt sharp pain, stopped to stretch      Manual Therapy   Manual Therapy  Soft tissue mobilization    Soft tissue mobilization  Pt in Lt S/L (reports unable to lay prone due to stomach issues) for STM to bil lumbar muscles and gluts, focusing on Rt>Lt                  PT Long Term Goals - 11/13/17 0840       PT LONG TERM GOAL #1   Title  Pt will report that his standing tolerance is increased so that he can do the dishes at home     Baseline  Pt cannot stand long enough to do the dishes; 50% improvement with this-11/13/17    Status  On-going      PT LONG TERM GOAL #2   Title  Pt will be indedependent in a home exericise program for lumbar stretching and core strengthening     Baseline  no knowledge; pt is independent with HEP thus far-11/13/17    Status  Partially Met      PT LONG TERM GOAL #3   Title  Pt will report the pain in his low back is decreased to a 3/10     Baseline  7/10; ; pain was all day but now pt reports only having 2-3/10 when consistent with HEP at night and upon waking-    Status  Achieved      PT LONG TERM GOAL #4   Title  Pt will decrease his TUG  to < 8 sec     Baseline  9.45 sec ; 8.96 sec-11/13/17    Status  On-going            Plan - 11/13/17 0948    Clinical Impression Statement  Overall pt has made great progress since starting therapy. At this time shows good progress towards most goals, and mas almost met improving TUG score goal. Progressed HEP to include bridging and also progressed core strengthening on ball with light weights today which pt tolerated very well requiring only minor VCs throughout for correct technique. Discussed with pt continuing PT and he would like to cont as he is noticing improvements with his pain including his sleep has even improved.     Rehab Potential  Good    Clinical Impairments Affecting Rehab Potential  perception that activity may cause injury due to previous bone "softening" from multipe myeloma     PT Treatment/Interventions  ADLs/Self Care Home Management;Balance training;Moist Heat;Therapeutic exercise;Therapeutic activities;Patient/family education;Manual techniques;Taping;Electrical Stimulation    PT Next Visit Plan  Renewal done today, will await Medicaid approval before scheduling more appts; Body mechanics  education;  Brief moist heat pack and soft tissue work to low back prn. Cont pelvic tilt on ball and review/cont core stabalization exercises and lumbar ROM and stretching, consider treadmill work toward community exercise.    Consulted and Agree with Plan of Care  Patient       Patient will benefit from skilled therapeutic intervention in order to improve the following deficits and impairments:  Abnormal gait, Increased fascial restricitons, Pain, Improper body mechanics, Postural dysfunction, Increased muscle spasms, Decreased mobility, Decreased activity tolerance, Decreased endurance, Decreased strength, Impaired perceived functional ability, Obesity, Difficulty walking  Visit Diagnosis: Abnormal posture  Acute midline low back pain without sciatica  Muscle weakness (generalized)     Problem List Patient Active Problem List   Diagnosis Date Noted  . Acute pain of left shoulder 09/19/2017  . Cataracts, bilateral 07/18/2017  . Prediabetes 01/05/2017  . Physical deconditioning 01/03/2017  . Cough in adult 12/20/2016  . Acute bronchitis 12/13/2016  . Blood glucose elevated 12/13/2016  . Sepsis ([place]) 12/09/2016  . Goals of care, counseling/discussion 11/18/2016  . S/P autologous bone marrow transplantation ([place]) 08/12/2016  . Mild protein-calorie malnutrition ([place]) 08/12/2016  . Dysphagia 08/12/2016  . Essential hypertension 05/26/2016  . Hypoxia 05/24/2016  . Dehydration 05/23/2016  . Chest pain 05/18/2016  . GERD (gastroesophageal reflux disease) 05/18/2016  . Blepharoconjunctivitis of both eyes 04/21/2016  . Dyspnea 03/25/2016  . Left leg pain 03/24/2016  . Port catheter in place 02/25/2016  . Pancytopenia due to antineoplastic chemotherapy ([place]) 02/25/2016  . History of syphilis 02/24/2016  . Encounter for antineoplastic chemotherapy 02/09/2016  . Skin rash 12/14/2015  . Peripheral neuropathy due to chemotherapy ([place]) 12/14/2015  . Anemia due to antineoplastic chemotherapy  09/22/2015  . Antineoplastic chemotherapy induced pancytopenia ([place])   . Bone pain   . Cancer associated pain 09/02/2015  . Multiple myeloma in relapse ([place]) 09/01/2015  . 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 Vitamin D deficiency 07/22/2015  . Closed right clavicular fracture 07/17/2015  . Chest wall pain 03/12/2015  . Thoracolumbar back pain 03/12/2015  . Allergic rhinitis 03/12/2015  . Poor dentition 01/15/2015  . Nerve damage 01/15/2015  . Esophagitis 12/28/2014  . Lower GI bleed   . Hemorrhoids 11/06/2014  . HTN (hypertension) 11/06/2014  . Priapism 11/06/2014  . Decreased visual acuity 11/06/2014  . Family history of prostate cancer in father 11/06/2014    Otelia Limes, PTA 11/13/2017, 10:00 AM  [place], [place], [address] Phone: [phone]   Fax:  [phone]  Name: Erik Nguyen MRN: [id number] Date of Birth: [birth year]

## 2017-11-14 ENCOUNTER — Inpatient Hospital Stay: Payer: Medicaid Other

## 2017-11-14 ENCOUNTER — Inpatient Hospital Stay: Payer: Medicaid Other | Attending: Hematology and Oncology

## 2017-11-14 ENCOUNTER — Encounter: Payer: Self-pay | Admitting: Hematology and Oncology

## 2017-11-14 ENCOUNTER — Inpatient Hospital Stay (HOSPITAL_BASED_OUTPATIENT_CLINIC_OR_DEPARTMENT_OTHER): Payer: Medicaid Other | Admitting: Hematology and Oncology

## 2017-11-14 VITALS — BP 133/95 | HR 95 | Temp 97.8°F | Resp 16

## 2017-11-14 DIAGNOSIS — Z7982 Long term (current) use of aspirin: Secondary | ICD-10-CM | POA: Diagnosis not present

## 2017-11-14 DIAGNOSIS — C9002 Multiple myeloma in relapse: Secondary | ICD-10-CM

## 2017-11-14 DIAGNOSIS — G893 Neoplasm related pain (acute) (chronic): Secondary | ICD-10-CM

## 2017-11-14 DIAGNOSIS — D61810 Antineoplastic chemotherapy induced pancytopenia: Secondary | ICD-10-CM | POA: Diagnosis not present

## 2017-11-14 DIAGNOSIS — T451X5A Adverse effect of antineoplastic and immunosuppressive drugs, initial encounter: Secondary | ICD-10-CM

## 2017-11-14 DIAGNOSIS — Z5112 Encounter for antineoplastic immunotherapy: Secondary | ICD-10-CM | POA: Insufficient documentation

## 2017-11-14 LAB — COMPREHENSIVE METABOLIC PANEL
ALK PHOS: 42 U/L (ref 40–150)
ALT: 16 U/L (ref 0–55)
ANION GAP: 8 (ref 3–11)
AST: 13 U/L (ref 5–34)
Albumin: 3.7 g/dL (ref 3.5–5.0)
BILIRUBIN TOTAL: 0.5 mg/dL (ref 0.2–1.2)
BUN: 17 mg/dL (ref 7–26)
CALCIUM: 9.2 mg/dL (ref 8.4–10.4)
CO2: 24 mmol/L (ref 22–29)
CREATININE: 1.19 mg/dL (ref 0.70–1.30)
Chloride: 111 mmol/L — ABNORMAL HIGH (ref 98–109)
GFR calc Af Amer: >60 mL/min (ref >60)
GFR calc non Af Amer: >60 mL/min (ref >60)
GLUCOSE: 118 mg/dL (ref 70–140)
Potassium: 3.7 mmol/L (ref 3.5–5.1)
SODIUM: 143 mmol/L (ref 136–145)
TOTAL PROTEIN: 6 g/dL — AB (ref 6.4–8.3)

## 2017-11-14 LAB — CBC WITH DIFFERENTIAL/PLATELET
BASOS ABS: 0 10*3/uL (ref 0.0–0.1)
BASOS PCT: 1 %
EOS ABS: 0.1 10*3/uL (ref 0.0–0.5)
EOS PCT: 3 %
HCT: 38.6 % (ref 38.4–49.9)
Hemoglobin: 13.3 g/dL (ref 13.0–17.1)
Lymphocytes Relative: 44 %
Lymphs Abs: 1.7 10*3/uL (ref 0.9–3.3)
MCH: 32.5 pg (ref 27.2–33.4)
MCHC: 34.5 g/dL (ref 32.0–36.0)
MCV: 94.4 fL (ref 79.3–98.0)
MONO ABS: 0.3 10*3/uL (ref 0.1–0.9)
Monocytes Relative: 9 %
Neutro Abs: 1.6 10*3/uL (ref 1.5–6.5)
Neutrophils Relative %: 43 %
PLATELETS: 162 10*3/uL (ref 140–400)
RBC: 4.09 MIL/uL — ABNORMAL LOW (ref 4.20–5.82)
RDW: 13.4 % (ref 11.0–14.6)
WBC: 3.8 10*3/uL — ABNORMAL LOW (ref 4.0–10.3)

## 2017-11-14 MED ORDER — SODIUM CHLORIDE 0.9% FLUSH
10.0000 mL | Freq: Once | INTRAVENOUS | Status: AC
  Administered 2017-11-14: 10 mL
  Filled 2017-11-14: qty 10

## 2017-11-14 MED ORDER — PROCHLORPERAZINE MALEATE 10 MG PO TABS
10.0000 mg | ORAL_TABLET | Freq: Once | ORAL | Status: AC
  Administered 2017-11-14: 10 mg via ORAL

## 2017-11-14 MED ORDER — HEPARIN SOD (PORK) LOCK FLUSH 100 UNIT/ML IV SOLN
500.0000 [IU] | Freq: Once | INTRAVENOUS | Status: AC | PRN
  Administered 2017-11-14: 500 [IU]
  Filled 2017-11-14: qty 5

## 2017-11-14 MED ORDER — ACETAMINOPHEN 325 MG PO TABS
650.0000 mg | ORAL_TABLET | Freq: Once | ORAL | Status: AC
  Administered 2017-11-14: 650 mg via ORAL

## 2017-11-14 MED ORDER — SODIUM CHLORIDE 0.9 % IV SOLN
Freq: Once | INTRAVENOUS | Status: AC
  Administered 2017-11-14: 10:00:00 via INTRAVENOUS

## 2017-11-14 MED ORDER — FAMOTIDINE IN NACL 20-0.9 MG/50ML-% IV SOLN
INTRAVENOUS | Status: AC
  Filled 2017-11-14: qty 50

## 2017-11-14 MED ORDER — ACETAMINOPHEN 325 MG PO TABS
ORAL_TABLET | ORAL | Status: AC
  Filled 2017-11-14: qty 2

## 2017-11-14 MED ORDER — SODIUM CHLORIDE 0.9 % IV SOLN
1200.0000 mg | Freq: Once | INTRAVENOUS | Status: AC
  Administered 2017-11-14: 1200 mg via INTRAVENOUS
  Filled 2017-11-14: qty 60

## 2017-11-14 MED ORDER — DEXAMETHASONE SODIUM PHOSPHATE 10 MG/ML IJ SOLN
10.0000 mg | Freq: Once | INTRAMUSCULAR | Status: AC
  Administered 2017-11-14: 10 mg via INTRAVENOUS

## 2017-11-14 MED ORDER — SODIUM CHLORIDE 0.9% FLUSH
10.0000 mL | INTRAVENOUS | Status: DC | PRN
  Administered 2017-11-14: 10 mL
  Filled 2017-11-14: qty 10

## 2017-11-14 MED ORDER — DEXAMETHASONE SODIUM PHOSPHATE 10 MG/ML IJ SOLN
INTRAMUSCULAR | Status: AC
Start: 2017-11-14 — End: 2017-11-14
  Filled 2017-11-14: qty 1

## 2017-11-14 MED ORDER — MONTELUKAST SODIUM 10 MG PO TABS
ORAL_TABLET | ORAL | Status: AC
  Filled 2017-11-14: qty 1

## 2017-11-14 MED ORDER — MONTELUKAST SODIUM 10 MG PO TABS
10.0000 mg | ORAL_TABLET | Freq: Once | ORAL | Status: AC
  Administered 2017-11-14: 10 mg via ORAL

## 2017-11-14 MED ORDER — FAMOTIDINE IN NACL 20-0.9 MG/50ML-% IV SOLN
20.0000 mg | Freq: Once | INTRAVENOUS | Status: AC
  Administered 2017-11-14: 20 mg via INTRAVENOUS

## 2017-11-14 MED ORDER — PROCHLORPERAZINE MALEATE 10 MG PO TABS
ORAL_TABLET | ORAL | Status: AC
  Filled 2017-11-14: qty 1

## 2017-11-14 NOTE — Assessment & Plan Note (Signed)
He has intermittent leukopenia from treatment He is not symptomatic Observe only

## 2017-11-14 NOTE — Assessment & Plan Note (Signed)
He has chronic musculoskeletal pain but with acute on chronic back pain recently X ray showed degenerative arthritis He will continue over-the-counter analgesics and tramadol as needed

## 2017-11-14 NOTE — Patient Instructions (Signed)
[facility] Discharge Instructions for Patients Receiving Chemotherapy  Today you received the following chemotherapy agents: Daratumumab (Darzalex).  To help prevent nausea and vomiting after your treatment, we encourage you to take your nausea medication as prescribed.    If you develop nausea and vomiting that is not controlled by your nausea medication, call the clinic.   BELOW ARE SYMPTOMS THAT SHOULD BE REPORTED IMMEDIATELY:  *FEVER GREATER THAN 100.5 F  *CHILLS WITH OR WITHOUT FEVER  NAUSEA AND VOMITING THAT IS NOT CONTROLLED WITH YOUR NAUSEA MEDICATION  *UNUSUAL SHORTNESS OF BREATH  *UNUSUAL BRUISING OR BLEEDING  TENDERNESS IN MOUTH AND THROAT WITH OR WITHOUT PRESENCE OF ULCERS  *URINARY PROBLEMS  *BOWEL PROBLEMS  UNUSUAL RASH Items with * indicate a potential emergency and should be followed up as soon as possible.  Feel free to call the clinic should you have any questions or concerns. The clinic phone number is [phone].  Please show the CHEMO ALERT CARD at check-in to the Emergency Department and triage nurse.  Zoledronic Acid injection (Hypercalcemia, Oncology) What is this medicine? ZOLEDRONIC ACID (ZOE le dron ik AS id) lowers the amount of calcium loss from bone. It is used to treat too much calcium in your blood from cancer. It is also used to prevent complications of cancer that has spread to the bone. This medicine may be used for other purposes; ask your health care provider or pharmacist if you have questions. COMMON BRAND NAME(S): Zometa What should I tell my health care provider before I take this medicine? They need to know if you have any of these conditions: -aspirin-sensitive asthma -cancer, especially if you are receiving medicines used to treat cancer -dental disease or wear dentures -infection -kidney disease -receiving corticosteroids like dexamethasone or prednisone -an unusual or allergic reaction to zoledronic acid,  other medicines, foods, dyes, or preservatives -pregnant or trying to get pregnant -breast-feeding How should I use this medicine? This medicine is for infusion into a vein. It is given by a health care professional in a hospital or clinic setting. Talk to your pediatrician regarding the use of this medicine in children. Special care may be needed. Overdosage: If you think you have taken too much of this medicine contact a poison control center or emergency room at once. NOTE: This medicine is only for you. Do not share this medicine with others. What if I miss a dose? It is important not to miss your dose. Call your doctor or health care professional if you are unable to keep an appointment. What may interact with this medicine? -certain antibiotics given by injection -NSAIDs, medicines for pain and inflammation, like ibuprofen or naproxen -some diuretics like bumetanide, furosemide -teriparatide -thalidomide This list may not describe all possible interactions. Give your health care provider a list of all the medicines, herbs, non-prescription drugs, or dietary supplements you use. Also tell them if you smoke, drink alcohol, or use illegal drugs. Some items may interact with your medicine. What should I watch for while using this medicine? Visit your doctor or health care professional for regular checkups. It may be some time before you see the benefit from this medicine. Do not stop taking your medicine unless your doctor tells you to. Your doctor may order blood tests or other tests to see how you are doing. Women should inform their doctor if they wish to become pregnant or think they might be pregnant. There is a potential for serious side effects to an unborn child.   Talk to your health care professional or pharmacist for more information. You should make sure that you get enough calcium and vitamin D while you are taking this medicine. Discuss the foods you eat and the vitamins you take  with your health care professional. Some people who take this medicine have severe bone, joint, and/or muscle pain. This medicine may also increase your risk for jaw problems or a broken thigh bone. Tell your doctor right away if you have severe pain in your jaw, bones, joints, or muscles. Tell your doctor if you have any pain that does not go away or that gets worse. Tell your dentist and dental surgeon that you are taking this medicine. You should not have major dental surgery while on this medicine. See your dentist to have a dental exam and fix any dental problems before starting this medicine. Take good care of your teeth while on this medicine. Make sure you see your dentist for regular follow-up appointments. What side effects may I notice from receiving this medicine? Side effects that you should report to your doctor or health care professional as soon as possible: -allergic reactions like skin rash, itching or hives, swelling of the face, lips, or tongue -anxiety, confusion, or depression -breathing problems -changes in vision -eye pain -feeling faint or lightheaded, falls -jaw pain, especially after dental work -mouth sores -muscle cramps, stiffness, or weakness -redness, blistering, peeling or loosening of the skin, including inside the mouth -trouble passing urine or change in the amount of urine Side effects that usually do not require medical attention (report to your doctor or health care professional if they continue or are bothersome): -bone, joint, or muscle pain -constipation -diarrhea -fever -hair loss -irritation at site where injected -loss of appetite -nausea, vomiting -stomach upset -trouble sleeping -trouble swallowing -weak or tired This list may not describe all possible side effects. Call your doctor for medical advice about side effects. You may report side effects to FDA at 1-800-FDA-1088. Where should I keep my medicine? This drug is given in a hospital  or clinic and will not be stored at home. NOTE: This sheet is a summary. It may not cover all possible information. If you have questions about this medicine, talk to your doctor, pharmacist, or health care provider.  2018 Elsevier/Gold Standard (2013-12-28 14:19:39)  

## 2017-11-14 NOTE — Progress Notes (Signed)
Erik Nguyen OFFICE PROGRESS NOTE  Patient Care Team: Charlott Rakes, MD as PCP - General (Family Medicine) Melburn Hake, Costella Hatcher, MD as Consulting Physician (Hematology and Oncology)  ASSESSMENT & PLAN:  Multiple myeloma in relapse [facility] - ) Recent myeloma panel confirmed that the patient has achieved complete response to treatment He will continue monthly daratumumab with Pomalyst, to be taken for 21 days and 7 days off He has recently completed dexamethasone taper He is taking aspirin for DVT prophylaxis He is reminded to take calcium, vitamin D and will receive Zometa every 3 months He will continue antimicrobial treatment with acyclovir and aspirin for DVT prophylaxis He had no recent dental issues  Antineoplastic chemotherapy induced pancytopenia ([place]) He has intermittent leukopenia from treatment He is not symptomatic Observe only  Cancer associated pain He has chronic musculoskeletal pain but with acute on chronic back pain recently X ray showed degenerative arthritis He will continue over-the-counter analgesics and tramadol as needed   No orders of the defined types were placed in this encounter.   INTERVAL HISTORY: Please see below for problem oriented charting. He returns for further follow-up Denies recent infection His chronic pain is stable He denies recent dental issues He tolerated chemo well without any recent nausea or diarrhea  SUMMARY OF ONCOLOGIC HISTORY:   Multiple myeloma in relapse ([place])   08/28/2015 Bone Marrow Biopsy    Accession: [id number] biopsy showed 58% myeloma involvement. Cytogenetics was normal and FISH showed Del 13q and t(11;14).       09/07/2015 - 01/22/2016 Chemotherapy    he received 6 cycles of treatment of Velcade and Revlimid with Dex       09/12/2015 - 09/16/2015 Hospital Admission    He was admitted to the hospital with sepsis, treatment was placed on hold and he received one dose of GCSF for leukopenia      01/12/2016 Adverse Reaction    Delay treatment due to neutropenia      01/27/2016 Imaging    ECHO at [facility] showed normal EF      01/27/2016 PET scan    PET scan at [facility] showed innumerable lytic lesions throughout      01/27/2016 Procedure    PFT is within normal limits at [facility]      01/28/2016 Bone Marrow Biopsy    Bone marrow at [facility] showed persistent plasma cell myeloma in a normocellularmarrow (30%) with 20% atypical plasma cells and myeloid hyperplasia.      02/11/2016 - 06/10/2016 Chemotherapy    He is started on salvage Rx with Kyprolis, Cytoxan and dexamethasone      03/24/2016 Imaging    US venous Doppler showed no evidence of deep vein or superficial thrombosis involving the right lower extremity and left lower extremity. No evidence of Baker&'s cyst on the right or left.      05/18/2016 - 05/19/2016 Hospital Admission    He was admitted for chest pain evaluation. Cardiac work-up excluded cardiac events and CT angiogram were negative for PE. It showed esphageal thickening c/w reflux esophagitis likely cause for his chest discomfort      05/23/2016 - 05/25/2016 Hospital Admission    Patient was hospitalized for worsening shortness of breath and worsening pain. CT angiogram chest negative for pulmonary emboli. Patient had a recent normal 2-D echo and ruled out for MI. Patient noted to be dehydrated. Patient admitted placed on IV fluids. Diuretic discontinued. Patient's gabapentin has been increased per recommendations from prior office visit from patient's  oncologist to 600 mg 3 times daily      06/15/2016 Bone Marrow Biopsy    Bone marrow biopsy showed only 2% plasma cell, normal FISH and cytogenetics      07/26/2016 - 07/26/2016 Chemotherapy    He received high dose melphalan at [facility]       07/27/2016 Bone Marrow Transplant    He received autologous stem cell transplant at [facility]      08/05/2016 - 08/10/2016 Hospital Admission    He was  admitted to [facility] for management of neutropenic fever and diarrhea. Cultures were negative. Stool culture came back positive for Novovirus and he was managed conservatively.      11/03/2016 Bone Marrow Biopsy    Bone Marrow Flow Cytometry - NO MONOCLONAL B CELL POPULATION IDENTIFIED. - PREDOMINANCE OF T CELLS WITH NON SPECIFIC CHANGES. - SEE NOTE. Diagnosis Comment: Analysis of the lymphoid population shows predominance of T lymphocytes expressing pan T-cell antigens but with relative abundance of CD8 positive cells and reversal of the CD4:CD8 ratio. No significant CD16/56 expression is identified. The T-cell changes are not considered specific in this setting. B cells represent the minor population with no monoclonality or abnormal phenotype. 4% lambda light chain restricted plasma cells. FISH positive CCND1/IGH 12% of cells.       11/09/2016 PET scan    No FDG avid myeloma is identified. Numerous osseous lytic lesions as above. 2.Hypermetabolic left level III lymph node, likely reactive etiology.  3.Ancillary CT findings as above.      11/29/2016 -  Chemotherapy    He received treatment with daratumumab and Pomalyst       12/13/2016 - 12/16/2016 Hospital Admission    He was admitted to the hospital for management of dehydration and early sepsis      01/23/2017 Procedure    Status post CT-guided bone marrow biopsy, with tissue specimen sent to pathology for complete histopathologic analysis      01/23/2017 Bone Marrow Biopsy    Bone Marrow, Aspirate,Biopsy, and Clot - SLIGHTLY HYPOCELLULAR BONE MARROW FOR AGE WITH TRILINEAGE HEMATOPOIESIS AND 1% PLASMA CELLS. - SEE COMMENT. PERIPHERAL BLOOD: - NORMOCYTIC-NORMOCHROMIC ANEMIA. - LEUKOPENIA. Diagnosis Note The bone marrow is slightly hypocellular for age with trilineage hematopoiesis and non specific myeloid changes likely related to previous treatment. The plasma cells represent 1% of all cells in the aspirate with lack of  large aggregate or sheets. To further evaluate the plasma cell component, immunohistochemical stains will be performed and the results reported in an addendum. Correlation with cytogenetic and FISH studies recommended.      01/31/2017 Adverse Reaction    Treatment was delayed due to SOB      01/31/2017 Imaging    1. No evidence of pulmonary embolism. Sensitivity is moderately degraded by respiratory motion. 2. Multiple myeloma.  Stable compression fracture at T9. 3. Coronary artery calcifications      10/17/2017 Imaging    No acute findings.  Mild spondylosis of the lumbar spinal mild multilevel disc disease.       REVIEW OF SYSTEMS:   Constitutional: Denies fevers, chills or abnormal weight loss Eyes: Denies blurriness of vision Ears, nose, mouth, throat, and face: Denies mucositis or sore throat Respiratory: Denies cough, dyspnea or wheezes Cardiovascular: Denies palpitation, chest discomfort or lower extremity swelling Gastrointestinal:  Denies nausea, heartburn or change in bowel habits Skin: Denies abnormal skin rashes Lymphatics: Denies new lymphadenopathy or easy bruising Neurological:Denies numbness, tingling or new weaknesses Behavioral/Psych: Mood is stable, no new  changes  All other systems were reviewed with the patient and are negative.  I have reviewed the past medical history, past surgical history, social history and family history with the patient and they are unchanged from previous note.  ALLERGIES:  is allergic to benadryl [diphenhydramine]; trazodone and nefazodone; and latex.  MEDICATIONS:  Current Outpatient Medications  Medication Sig Dispense Refill  . acetaminophen (TYLENOL) 500 MG tablet Take 1,000 mg by mouth every 6 (six) hours as needed for fever.    Marland Kitchen acyclovir (ZOVIRAX) 400 MG tablet Take 1 tablet (400 mg total) by mouth 2 (two) times daily. 60 tablet 6  . aluminum-magnesium hydroxide-simethicone (MAALOX) 003-491-79 MG/5ML SUSP Take 30 mLs by  mouth every 4 (four) hours as needed.    Marland Kitchen aspirin 81 MG chewable tablet Chew 81 mg by mouth daily.    Marland Kitchen CALCIUM PO Take 1 tablet by mouth daily.    . cholecalciferol (VITAMIN D) 1000 units tablet Take 1,000 Units by mouth daily.     . folic acid (FOLVITE) 1 MG tablet TAKE 1 TABLET BY MOUTH ONCE DAILY 90 tablet 1  . gabapentin (NEURONTIN) 300 MG capsule Take 1-2 capsules (300-600 mg total) by mouth 2 (two) times daily. Take 300 mg in the morning and 600 mg at night. 100 capsule 9  . hydrochlorothiazide (HYDRODIURIL) 25 MG tablet Take 25 mg by mouth daily.    Marland Kitchen lidocaine-prilocaine (EMLA) cream Apply to affected area once 30 g 3  . lisinopril (PRINIVIL,ZESTRIL) 30 MG tablet Take 1 tablet (30 mg total) by mouth daily. 30 tablet 0  . loperamide (IMODIUM) 2 MG capsule Take 2 mg by mouth 4 (four) times daily as needed.    . metFORMIN (GLUCOPHAGE) 500 MG tablet Take 500 mg by mouth daily as needed.    . Multiple Vitamin (MULTIVITAMIN) tablet Take 1 tablet by mouth daily.  1  . ondansetron (ZOFRAN) 8 MG tablet Take 8 mg by mouth every 8 (eight) hours as needed.    . pomalidomide (POMALYST) 2 MG capsule Take 1 capsule (2 mg total) by mouth daily. Take with water on days 1-21. Repeat every 28 days. (Patient not taking: Reported on 10/11/2017) 21 capsule 0  . prochlorperazine (COMPAZINE) 10 MG tablet Take 10 mg by mouth every 6 (six) hours as needed.    . ranitidine (ZANTAC) 150 MG tablet TAKE 1 TABLET BY MOUTH ONCE DAILY AT BEDTIME 30 tablet 1  . tiZANidine (ZANAFLEX) 4 MG tablet Take 1 tablet (4 mg total) by mouth every 8 (eight) hours as needed for muscle spasms. 60 tablet 0  . traMADol (ULTRAM) 50 MG tablet Take 1-2 tablets (50-100 mg total) by mouth every 6 (six) hours as needed. for pain (Patient not taking: Reported on 10/23/2017) 90 tablet 0  . zolpidem (AMBIEN) 10 MG tablet Take 1 tablet (10 mg total) by mouth at bedtime as needed for sleep. 30 tablet 3   No current facility-administered medications  for this visit.    Facility-Administered Medications Ordered in Other Visits  Medication Dose Route Frequency Provider Last Rate Last Dose  . heparin lock flush 100 unit/mL  500 Units Intracatheter Once PRN Alvy Bimler, Korianna Washer, MD      . heparin lock flush 100 unit/mL  500 Units Intracatheter Once PRN Alvy Bimler, Sherell Christoffel, MD      . sodium chloride flush (NS) 0.9 % injection 10 mL  10 mL Intracatheter PRN Alvy Bimler, Davelle Anselmi, MD   10 mL at 03/21/17 0924  . sodium chloride flush (NS)  0.9 % injection 10 mL  10 mL Intracatheter PRN Alvy Bimler, Jiya Kissinger, MD        PHYSICAL EXAMINATION: ECOG PERFORMANCE STATUS: 1 - Symptomatic but completely ambulatory  Vitals:   11/14/17 0940  BP: 138/85  Pulse: 98  SpO2: 100%   Filed Weights   11/14/17 0940  Weight: 181 lb 4.8 oz (82.2 kg)    GENERAL:alert, no distress and comfortable SKIN: skin color, texture, turgor are normal, no rashes or significant lesions EYES: normal, Conjunctiva are pink and non-injected, sclera clear OROPHARYNX:no exudate, no erythema and lips, buccal mucosa, and tongue normal  NECK: supple, thyroid normal size, non-tender, without nodularity LYMPH:  no palpable lymphadenopathy in the cervical, axillary or inguinal LUNGS: clear to auscultation and percussion with normal breathing effort HEART: regular rate & rhythm and no murmurs and no lower extremity edema ABDOMEN:abdomen soft, non-tender and normal bowel sounds Musculoskeletal:no cyanosis of digits and no clubbing  NEURO: alert & oriented x 3 with fluent speech, no focal motor/sensory deficits  LABORATORY DATA:  I have reviewed the data as listed    Component Value Date/Time   NA 143 11/14/2017 0851   NA 140 07/18/2017 0909   K 3.7 11/14/2017 0851   K 3.6 07/18/2017 0909   CL 111 (H) 11/14/2017 0851   CO2 24 11/14/2017 0851   CO2 24 07/18/2017 0909   GLUCOSE 118 11/14/2017 0851   GLUCOSE 109 07/18/2017 0909   BUN 17 11/14/2017 0851   BUN 12.1 07/18/2017 0909   CREATININE 1.19 11/14/2017  0851   CREATININE 1.3 07/18/2017 0909   CALCIUM 9.2 11/14/2017 0851   CALCIUM 9.3 07/18/2017 0909   PROT 6.0 (L) 11/14/2017 0851   PROT 6.4 07/18/2017 0909   PROT 6.7 07/18/2017 0909   ALBUMIN 3.7 11/14/2017 0851   ALBUMIN 3.9 07/18/2017 0909   AST 13 11/14/2017 0851   AST 13 07/18/2017 0909   ALT 16 11/14/2017 0851   ALT 15 07/18/2017 0909   ALKPHOS 42 11/14/2017 0851   ALKPHOS 49 07/18/2017 0909   BILITOT 0.5 11/14/2017 0851   BILITOT 0.50 07/18/2017 0909   GFRNONAA >60 11/14/2017 0851   GFRNONAA 71 07/24/2015 1155   GFRAA >60 11/14/2017 0851   GFRAA 82 07/24/2015 1155    No results found for: SPEP, UPEP  Lab Results  Component Value Date   WBC 3.8 (L) 11/14/2017   NEUTROABS 1.6 11/14/2017   HGB 13.3 11/14/2017   HCT 38.6 11/14/2017   MCV 94.4 11/14/2017   PLT 162 11/14/2017      Chemistry      Component Value Date/Time   NA 143 11/14/2017 0851   NA 140 07/18/2017 0909   K 3.7 11/14/2017 0851   K 3.6 07/18/2017 0909   CL 111 (H) 11/14/2017 0851   CO2 24 11/14/2017 0851   CO2 24 07/18/2017 0909   BUN 17 11/14/2017 0851   BUN 12.1 07/18/2017 0909   CREATININE 1.19 11/14/2017 0851   CREATININE 1.3 07/18/2017 0909      Component Value Date/Time   CALCIUM 9.2 11/14/2017 0851   CALCIUM 9.3 07/18/2017 0909   ALKPHOS 42 11/14/2017 0851   ALKPHOS 49 07/18/2017 0909   AST 13 11/14/2017 0851   AST 13 07/18/2017 0909   ALT 16 11/14/2017 0851   ALT 15 07/18/2017 0909   BILITOT 0.5 11/14/2017 0851   BILITOT 0.50 07/18/2017 0909       RADIOGRAPHIC STUDIES: I have personally reviewed the radiological images as listed and agreed  with the findings in the report. Dg Lumbar Spine 2-3 Views  Result Date: 10/17/2017 CLINICAL DATA:  Mid to low back pain 1 week. Multiple myeloma diagnosed 2017. EXAM: LUMBAR SPINE - 2-3 VIEW COMPARISON:  None. FINDINGS: Vertebral body alignment and heights are normal. There is mild spondylosis of the lumbar spine to include facet  arthropathy. No compression fracture or spondylolisthesis. Mild disc space narrowing at the L2-3, L3-4 L4-5 levels. IMPRESSION: No acute findings. Mild spondylosis of the lumbar spinal mild multilevel disc disease. Electronically Signed   By: Marin Olp M.D.   On: 10/17/2017 17:12    All questions were answered. The patient knows to call the clinic with any problems, questions or concerns. No barriers to learning was detected.  I spent 15 minutes counseling the patient face to face. The total time spent in the appointment was 20 minutes and more than 50% was on counseling and review of test results  Heath Lark, MD 11/14/2017 12:17 PM

## 2017-11-14 NOTE — Assessment & Plan Note (Signed)
Recent myeloma panel confirmed that the patient has achieved complete response to treatment He will continue monthly daratumumab with Pomalyst, to be taken for 21 days and 7 days off He has recently completed dexamethasone taper He is taking aspirin for DVT prophylaxis He is reminded to take calcium, vitamin D and will receive Zometa every 3 months He will continue antimicrobial treatment with acyclovir and aspirin for DVT prophylaxis He had no recent dental issues

## 2017-11-15 ENCOUNTER — Other Ambulatory Visit: Payer: Self-pay | Admitting: Hematology and Oncology

## 2017-11-15 LAB — KAPPA/LAMBDA LIGHT CHAINS
KAPPA, LAMDA LIGHT CHAIN RATIO: 0.81 (ref 0.26–1.65)
Kappa free light chain: 5 mg/L (ref 3.3–19.4)
Lambda free light chains: 6.2 mg/L (ref 5.7–26.3)

## 2017-11-16 LAB — MULTIPLE MYELOMA PANEL, SERUM
ALBUMIN SERPL ELPH-MCNC: 3.6 g/dL (ref 2.9–4.4)
ALPHA 1: 0.2 g/dL (ref 0.0–0.4)
Albumin/Glob SerPl: 1.8 — ABNORMAL HIGH (ref 0.7–1.7)
Alpha2 Glob SerPl Elph-Mcnc: 0.7 g/dL (ref 0.4–1.0)
B-Globulin SerPl Elph-Mcnc: 0.8 g/dL (ref 0.7–1.3)
Gamma Glob SerPl Elph-Mcnc: 0.4 g/dL (ref 0.4–1.8)
Globulin, Total: 2.1 g/dL — ABNORMAL LOW (ref 2.2–3.9)
IGA: 30 mg/dL — AB (ref 61–437)
IGM (IMMUNOGLOBULIN M), SRM: 17 mg/dL — AB (ref 20–172)
IgG (Immunoglobin G), Serum: 502 mg/dL — ABNORMAL LOW (ref 700–1600)
M Protein SerPl Elph-Mcnc: 0.1 g/dL — ABNORMAL HIGH
Total Protein ELP: 5.7 g/dL — ABNORMAL LOW (ref 6.0–8.5)

## 2017-11-20 ENCOUNTER — Ambulatory Visit: Payer: Medicaid Other

## 2017-11-20 DIAGNOSIS — M6281 Muscle weakness (generalized): Secondary | ICD-10-CM

## 2017-11-20 DIAGNOSIS — R293 Abnormal posture: Secondary | ICD-10-CM | POA: Diagnosis not present

## 2017-11-20 DIAGNOSIS — M545 Low back pain, unspecified: Secondary | ICD-10-CM

## 2017-11-20 NOTE — Therapy (Signed)
[place], [place], [address] Phone: [phone]   Fax:  [phone]  Physical Therapy Treatment  Patient Details  Name: Erik Nguyen MRN: [id number] Date of Birth: September 16, 1955 Referring Provider: Dr. Alvy Bimler    Encounter Date: 11/20/2017  PT End of Session - 11/20/17 1014    Visit Number  5    Number of Visits  16    Date for PT Re-Evaluation  12/25/17    Authorization Type  Medicaid     Authorization Time Period  6 weeks- 11/20/17 to 12/31/17    Authorization - Number of Visits  15    PT Start Time  0937    PT Stop Time  1018    PT Time Calculation (min)  41 min    Activity Tolerance  Patient tolerated treatment well    Behavior During Therapy  [facility] for tasks assessed/performed       Past Medical History:  Diagnosis Date  . Dyspnea    exertion   . Encounter for antineoplastic chemotherapy 02/09/2016  . GERD (gastroesophageal reflux disease)   . History of syphilis 02/24/2016  . Hypertension Dx 2016  . Multiple myeloma not having achieved remission ([place]) 09/01/2015  . Pre-diabetes   . Priapism 2014     Past Surgical History:  Procedure Laterality Date  . COLONOSCOPY    . POLYPECTOMY      There were no vitals filed for this visit.  Subjective Assessment - 11/20/17 0939    Subjective  I went to the gym Wednesday and worked out for the first time in [place]. I'm still sore but I feel good from doing it. And my back really isn't bothering me right now, it's continuing to do better. Dr. Alvy Bimler, who I saw last week, wants me to start working out more and getting stronger. Awhile ago she lifted my weight restrictions and said weight training was ok as well.     Pertinent History  Muliple myeloma with  bone marrow transplant 07/27/2016 and has had complete respone to treatment     Limitations  Sitting;Lifting;Standing;Walking;House hold activities    Patient Stated Goals  To get some relief from his  back pain .    Currently in Pain?  No/denies                       [facility] Adult PT Treatment/Exercise - 11/20/17 0001      Lumbar Exercises: Stretches   Passive Hamstring Stretch  Right;Left;1 rep;20 seconds    Single Knee to Chest Stretch  Right;Left;2 reps;20 seconds    Lower Trunk Rotation  2 reps;20 seconds bil      Lumbar Exercises: Aerobic   Elliptical  Resistance 1 and incline 7 x1:40 mins, stopped due to LE fatigue    Recumbent Bike  Level 3 x5 mins after elliptical      Lumbar Exercises: Machines for Strengthening   Leg Press  65#, 3x10 with VCs for correct technique throughout      Lumbar Exercises: Standing   Other Standing Lumbar Exercises  Standing on [employer]ire[employer] for squats holding small physioball and OH lift with returning from squat, then bil diagonols with OH lift, 3x each with VCs for core engeagment throughout      Lumbar Exercises: Supine   Bridge with [employer]  10 reps    Bridge with clamshell  10 reps no pain with this today; 2 sets of 5    Single  Leg Bridge  5 reps bil    Other Supine Lumbar Exercises  Reverse curl 10x             PT Education - 11/20/17 0953    Education provided  Yes    Education Details  Body mechanics education with ADLs    Person(s) Educated  Patient    Methods  Explanation;Demonstration;Handout    Comprehension  Verbalized understanding          PT Long Term Goals - 11/13/17 0840      PT LONG TERM GOAL #1   Title  Pt will report that his standing tolerance is increased so that he can do the dishes at home     Baseline  Pt cannot stand long enough to do the dishes; 50% improvement with this-11/13/17    Status  On-going      PT LONG TERM GOAL #2   Title  Pt will be indedependent in a home exericise program for lumbar stretching and core strengthening     Baseline  no knowledge; pt is independent with HEP thus far-11/13/17    Status  Partially Met      PT LONG TERM GOAL #3   Title  Pt will report the pain  in his low back is decreased to a 3/10     Baseline  pain was all day but now pt reports only having 2-3/10 when consistent with HEP at night and upon waking-    Status  Achieved      PT LONG TERM GOAL #4   Title  Pt will decrease his TUG  to < 8 sec     Baseline  9.45 sec ; 8.96 sec-11/13/17    Status  On-going            Plan - 11/20/17 1016    Clinical Impression Statement  Pt doing excellent today. Was able to progress strengthening exercises (pt reports Dr. Alvy Bimler cleared him for weight training/strengthening) so included leg press as pt reports started working out at his gym last week. Also progressed core strengthening in standing and supine today. Educated pt in body mechanics as well to decrease risk of fare up of LBP in future, especially with progressive conditions such as arthritis and degenerative disc changes per MD note. Pt reported feeling very good after session with no increased pain.    Rehab Potential  Good    Clinical Impairments Affecting Rehab Potential  perception that activity may cause injury due to previous bone "softening" from multipe myeloma     PT Frequency  2x / week    PT Duration  6 weeks    PT Treatment/Interventions  ADLs/Self Care Home Management;Balance training;Moist Heat;Therapeutic exercise;Therapeutic activities;Patient/family education;Manual techniques;Taping;Electrical Stimulation    PT Next Visit Plan  Cont to progress core strength in varying positions and lumbar/hip flexibility; review body mechanics education prn/ask how pt is doing incorporating this into his day. Prn manual therapy and moist heat to low back. Cont working towards Systems developer with Plan of Care  Patient       Patient will benefit from skilled therapeutic intervention in order to improve the following deficits and impairments:  Abnormal gait, Increased fascial restricitons, Pain, Improper body mechanics, Postural dysfunction, Increased muscle  spasms, Decreased mobility, Decreased activity tolerance, Decreased endurance, Decreased strength, Impaired perceived functional ability, Obesity, Difficulty walking  Visit Diagnosis: Abnormal posture  Acute midline low back pain without sciatica  Muscle weakness (generalized)  Problem List Patient Active Problem List   Diagnosis Date Noted  . Acute pain of left shoulder 09/19/2017  . Cataracts, bilateral 07/18/2017  . Prediabetes 01/05/2017  . Physical deconditioning 01/03/2017  . Cough in adult 12/20/2016  . Acute bronchitis 12/13/2016  . Blood glucose elevated 12/13/2016  . Sepsis ([place]) 12/09/2016  . Goals of care, counseling/discussion 11/18/2016  . S/P autologous bone marrow transplantation ([place]) 08/12/2016  . Mild protein-calorie malnutrition ([place]) 08/12/2016  . Dysphagia 08/12/2016  . Essential hypertension 05/26/2016  . Hypoxia 05/24/2016  . Dehydration 05/23/2016  . Chest pain 05/18/2016  . GERD (gastroesophageal reflux disease) 05/18/2016  . Blepharoconjunctivitis of both eyes 04/21/2016  . Dyspnea 03/25/2016  . Left leg pain 03/24/2016  . Port catheter in place 02/25/2016  . Pancytopenia due to antineoplastic chemotherapy ([place]) 02/25/2016  . History of syphilis 02/24/2016  . Encounter for antineoplastic chemotherapy 02/09/2016  . Skin rash 12/14/2015  . Peripheral neuropathy due to chemotherapy ([place]) 12/14/2015  . Anemia due to antineoplastic chemotherapy 09/22/2015  . Antineoplastic chemotherapy induced pancytopenia ([place])   . Bone pain   . Cancer associated pain 09/02/2015  . Multiple myeloma in relapse ([place]) 09/01/2015  . 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 Vitamin D deficiency 07/22/2015  . Closed right clavicular fracture 07/17/2015  . Chest wall pain 03/12/2015  . Thoracolumbar back pain 03/12/2015  . Allergic rhinitis 03/12/2015  . Poor dentition 01/15/2015  . Nerve damage 01/15/2015  . Esophagitis 12/28/2014  . Lower GI bleed   .  Hemorrhoids 11/06/2014  . HTN (hypertension) 11/06/2014  . Priapism 11/06/2014  . Decreased visual acuity 11/06/2014  . Family history of prostate cancer in father 11/06/2014    Otelia Limes, PTA 11/20/2017, 11:17 AM  [place], [place], [address] Phone: [phone]   Fax:  [phone]  Name: Bryce Cheever MRN: [id number] Date of Birth: 1956-01-04

## 2017-11-20 NOTE — Patient Instructions (Signed)
Avoid Twisting    Avoid twisting or bending back. Pivot around using foot movements, and bend at knees if needed when reaching for articles.  Alternating Positions    Alternate tasks and change positions frequently to reduce fatigue and muscle tension. Take rest breaks.  Bending    Bend at hips and knees, not back. Keep feet shoulder-width apart.  Brushing Teeth     Place one foot on ledge and one hand on counter. Bend other knee slightly to keep back straight.  Cancer Rehab [phone]

## 2017-11-22 ENCOUNTER — Ambulatory Visit: Payer: Medicaid Other | Admitting: Rehabilitation

## 2017-11-22 ENCOUNTER — Encounter: Payer: Self-pay | Admitting: Rehabilitation

## 2017-11-22 DIAGNOSIS — R293 Abnormal posture: Secondary | ICD-10-CM

## 2017-11-22 DIAGNOSIS — M545 Low back pain, unspecified: Secondary | ICD-10-CM

## 2017-11-22 DIAGNOSIS — M6281 Muscle weakness (generalized): Secondary | ICD-10-CM

## 2017-11-22 NOTE — Therapy (Signed)
[place], [place], [address] Phone: [phone]   Fax:  [phone]  Physical Therapy Treatment  Patient Details  Name: Erik Nguyen MRN: [id number] Date of Birth: 09/12/1955 Referring Provider: Dr. Alvy Bimler    Encounter Date: 11/22/2017  PT End of Session - 11/22/17 1306    Visit Number  6    Number of Visits  16    Date for PT Re-Evaluation  12/25/17    Authorization Type  Medicaid     Authorization Time Period  6 weeks- 11/20/17 to 12/31/17    Authorization - Number of Visits  15    PT Start Time  1300    PT Stop Time  1340    PT Time Calculation (min)  40 min    Activity Tolerance  Patient tolerated treatment well    Behavior During Therapy  [facility] for tasks assessed/performed       Past Medical History:  Diagnosis Date  . Dyspnea    exertion   . Encounter for antineoplastic chemotherapy 02/09/2016  . GERD (gastroesophageal reflux disease)   . History of syphilis 02/24/2016  . Hypertension Dx 2016  . Multiple myeloma not having achieved remission ([place]) 09/01/2015  . Pre-diabetes   . Priapism 2014     Past Surgical History:  Procedure Laterality Date  . COLONOSCOPY    . POLYPECTOMY      There were no vitals filed for this visit.  Subjective Assessment - 11/22/17 1302    Subjective  Still feeling good/better.      Pertinent History  Muliple myeloma with  bone marrow transplant 07/27/2016 and has had complete respone to treatment     Limitations  Sitting;Lifting;Standing;Walking;House hold activities    Patient Stated Goals  To get some relief from his back pain, get stronger    Currently in Pain?  No/denies                       [facility] Adult PT Treatment/Exercise - 11/22/17 0001      Lumbar Exercises: Stretches   Passive Hamstring Stretch  Right;Left;20 seconds;2 reps cueing to focus on knee straight vs lifting higher    Lower Trunk Rotation  3 reps;10 seconds    Gastroc Stretch  20 seconds;Right;Left;2 reps    Gastroc Stretch Limitations  cueing for 1st time      Lumbar Exercises: Aerobic   Elliptical  Resistance 1; incline 5 to fatigue (able to get 62mn) cueing to relax shoulders and look ahead    Recumbent Bike  Level 3 x 682m      Lumbar Exercises: Machines for Strengthening   Leg Press  65#, 3x10 with VCs for correct technique throughout pt states this could be heavier      Lumbar Exercises: Standing   Other Standing Lumbar Exercises  Standing on Airex for squats holding small purple physioball and OH lift with returning from squat x 10 with sig cueing to keep knees behind toes, then bil diagonols with OH lift, 3x each with VCs for core engeagment throughout      Lumbar Exercises: Supine   Bridge  20 reps    Bridge with clamshell  10 reps green band    Single Leg Bridge  5 reps bil                  PT Long Term Goals - 11/13/17 0840      PT LONG TERM GOAL #1  Title  Pt will report that his standing tolerance is increased so that he can do the dishes at home     Baseline  Pt cannot stand long enough to do the dishes; 50% improvement with this-11/13/17    Status  On-going      PT LONG TERM GOAL #2   Title  Pt will be indedependent in a home exericise program for lumbar stretching and core strengthening     Baseline  no knowledge; pt is independent with HEP thus far-11/13/17    Status  Partially Met      PT LONG TERM GOAL #3   Title  Pt will report the pain in his low back is decreased to a 3/10     Baseline  pain was all day but now pt reports only having 2-3/10 when consistent with HEP at night and upon waking-    Status  Achieved      PT LONG TERM GOAL #4   Title  Pt will decrease his TUG  to < 8 sec     Baseline  9.45 sec ; 8.96 sec-11/13/17    Status  On-going            Plan - 11/22/17 1343    Clinical Impression Statement  Pt continues today doing very well.  Still reports only back pain is in the morning and  does not last long.  He will still benefit from skilled PT to increase strength and endurance for the gym    Clinical Presentation  Stable    Clinical Decision Making  Low    Rehab Potential  Good    Clinical Impairments Affecting Rehab Potential  perception that activity may cause injury due to previous bone "softening" from multipe myeloma     PT Frequency  2x / week    PT Duration  6 weeks    PT Treatment/Interventions  ADLs/Self Care Home Management;Balance training;Moist Heat;Therapeutic exercise;Therapeutic activities;Patient/family education;Manual techniques;Taping;Electrical Stimulation    PT Next Visit Plan  Cont to progress core strength in varying positions and lumbar/hip flexibility; review body mechanics education prn/ask how pt is doing incorporating this into his day. Prn manual therapy and moist heat to low back. Cont working towards community program       Patient will benefit from skilled therapeutic intervention in order to improve the following deficits and impairments:  Abnormal gait, Increased fascial restricitons, Pain, Improper body mechanics, Postural dysfunction, Increased muscle spasms, Decreased mobility, Decreased activity tolerance, Decreased endurance, Decreased strength, Impaired perceived functional ability, Obesity, Difficulty walking  Visit Diagnosis: Abnormal posture  Acute midline low back pain without sciatica  Muscle weakness (generalized)     Problem List Patient Active Problem List   Diagnosis Date Noted  . Acute pain of left shoulder 09/19/2017  . Cataracts, bilateral 07/18/2017  . Prediabetes 01/05/2017  . Physical deconditioning 01/03/2017  . Cough in adult 12/20/2016  . Acute bronchitis 12/13/2016  . Blood glucose elevated 12/13/2016  . Sepsis ([place]) 12/09/2016  . Goals of care, counseling/discussion 11/18/2016  . S/P autologous bone marrow transplantation ([place]) 08/12/2016  . Mild protein-calorie malnutrition ([place]) 08/12/2016  .  Dysphagia 08/12/2016  . Essential hypertension 05/26/2016  . Hypoxia 05/24/2016  . Dehydration 05/23/2016  . Chest pain 05/18/2016  . GERD (gastroesophageal reflux disease) 05/18/2016  . Blepharoconjunctivitis of both eyes 04/21/2016  . Dyspnea 03/25/2016  . Left leg pain 03/24/2016  . Port catheter in place 02/25/2016  . Pancytopenia due to antineoplastic chemotherapy ([place]) 02/25/2016  .  History of syphilis 02/24/2016  . Encounter for antineoplastic chemotherapy 02/09/2016  . Skin rash 12/14/2015  . 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 Anemia due to antineoplastic chemotherapy 09/22/2015  . Antineoplastic chemotherapy induced pancytopenia ([place])   . Bone pain   . Cancer associated pain 09/02/2015  . Multiple myeloma in relapse ([place]) 09/01/2015  . 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 Vitamin D deficiency 07/22/2015  . Closed right clavicular fracture 07/17/2015  . Chest wall pain 03/12/2015  . Thoracolumbar back pain 03/12/2015  . Allergic rhinitis 03/12/2015  . Poor dentition 01/15/2015  . Nerve damage 01/15/2015  . Esophagitis 12/28/2014  . Lower GI bleed   . Hemorrhoids 11/06/2014  . HTN (hypertension) 11/06/2014  . Priapism 11/06/2014  . Decreased visual acuity 11/06/2014  . Family history of prostate cancer in father 11/06/2014   Shan Levans, DPT 11/22/2017, 1:47 PM  [place], [place], [address] Phone: [phone]   Fax:  [phone]  Name: Erik Nguyen MRN: [id number] Date of Birth: 01/10/1956

## 2017-11-27 ENCOUNTER — Ambulatory Visit: Payer: Medicaid Other

## 2017-12-06 ENCOUNTER — Ambulatory Visit: Payer: Medicaid Other

## 2017-12-06 DIAGNOSIS — M545 Low back pain, unspecified: Secondary | ICD-10-CM

## 2017-12-06 DIAGNOSIS — M6281 Muscle weakness (generalized): Secondary | ICD-10-CM

## 2017-12-06 DIAGNOSIS — R293 Abnormal posture: Secondary | ICD-10-CM

## 2017-12-06 NOTE — Therapy (Signed)
[place], [place], [address] Phone: [phone]   Fax:  [phone]  Physical Therapy Treatment  Patient Details  Name: Erik Nguyen MRN: [id number] Date of Birth: Aug 29, 1955 Referring Provider: Dr. Alvy Bimler    Encounter Date: 12/06/2017  PT End of Session - 12/06/17 0929    Visit Number  7    Number of Visits  16    Date for PT Re-Evaluation  12/25/17    PT Start Time  0852    PT Stop Time  0935    PT Time Calculation (min)  43 min    Activity Tolerance  Patient tolerated treatment well    Behavior During Therapy  [facility] for tasks assessed/performed       Past Medical History:  Diagnosis Date  . Dyspnea    exertion   . Encounter for antineoplastic chemotherapy 02/09/2016  . GERD (gastroesophageal reflux disease)   . History of syphilis 02/24/2016  . Hypertension Dx 2016  . Multiple myeloma not having achieved remission ([place]) 09/01/2015  . Pre-diabetes   . Priapism 2014     Past Surgical History:  Procedure Laterality Date  . COLONOSCOPY    . POLYPECTOMY      There were no vitals filed for this visit.  Subjective Assessment - 12/06/17 0901    Subjective  Got back from driving to [facility] Monday and I've just been really stiff since then. My Lt upper thigh is bothering me but htat comes and goes at times when my back is worse.     Pertinent History  Muliple myeloma with  bone marrow transplant 07/27/2016 and has had complete respone to treatment     Patient Stated Goals  To get some relief from his back pain, get stronger    Currently in Pain?  Yes    Pain Score  4     Pain Location  Leg    Pain Orientation  Upper;Left    Pain Descriptors / Indicators  Aching;Dull    Pain Type  Chronic pain    Pain Onset  In the past 7 days    Pain Frequency  Intermittent    Aggravating Factors   sitting in car too long    Pain Relieving Factors  stretching                       [facility] Adult  PT Treatment/Exercise - 12/06/17 0001      Lumbar Exercises: Stretches   Passive Hamstring Stretch  Right;Left;20 seconds;2 reps    Single Knee to Chest Stretch  Right;Left;3 reps;20 seconds    Lower Trunk Rotation  3 reps;10 seconds    Other Lumbar Stretch Exercise  Standing lumbar extension 3 times, 5 sec hold      Lumbar Exercises: Aerobic   Recumbent Bike  Level 3, x4 mins.this was very fatiguing.      Moist Heat Therapy   Number Minutes Moist Heat  15 Minutes On heat while performing stretches     Moist Heat Location  Lumbar Spine      Manual Therapy   Manual Therapy  Soft tissue mobilization    Soft tissue mobilization  Pt in Lt S/L (reports unable to lay prone due to stomach issues) for STM to bil lumbar muscles and gluts, focusing on Rt>Lt                  PT Long Term Goals - 11/13/17 0840  PT LONG TERM GOAL #1   Title  Pt will report that his standing tolerance is increased so that he can do the dishes at home     Baseline  Pt cannot stand long enough to do the dishes; 50% improvement with this-11/13/17    Status  On-going      PT LONG TERM GOAL #2   Title  Pt will be indedependent in a home exericise program for lumbar stretching and core strengthening     Baseline  no knowledge; pt is independent with HEP thus far-11/13/17    Status  Partially Met      PT LONG TERM GOAL #3   Title  Pt will report the pain in his low back is decreased to a 3/10     Baseline  pain was all day but now pt reports only having 2-3/10 when consistent with HEP at night and upon waking-    Status  Achieved      PT LONG TERM GOAL #4   Title  Pt will decrease his TUG  to < 8 sec     Baseline  9.45 sec ; 8.96 sec-11/13/17    Status  On-going            Plan - 12/06/17 0931    Clinical Impression Statement  Pt came in feeling very tight in low back and with some pain at Lt upper thigh from driving back from [facility] Monday. So focused on soft tissue, moist heat and stretches  today. At end of session pt reported back feeling much looser and thigh pain completely diminished.     Rehab Potential  Good    Clinical Impairments Affecting Rehab Potential  perception that activity may cause injury due to previous bone "softening" from multipe myeloma     PT Frequency  2x / week    PT Duration  6 weeks    PT Treatment/Interventions  ADLs/Self Care Home Management;Balance training;Moist Heat;Therapeutic exercise;Therapeutic activities;Patient/family education;Manual techniques;Taping;Electrical Stimulation    PT Next Visit Plan  Cont to progress core strength in varying positions and lumbar/hip flexibility; review body mechanics education prn/ask how pt is doing incorporating this into his day. Prn manual therapy and moist heat to low back. Cont working towards Systems developer with Plan of Care  Patient       Patient will benefit from skilled therapeutic intervention in order to improve the following deficits and impairments:  Abnormal gait, Increased fascial restricitons, Pain, Improper body mechanics, Postural dysfunction, Increased muscle spasms, Decreased mobility, Decreased activity tolerance, Decreased endurance, Decreased strength, Impaired perceived functional ability, Obesity, Difficulty walking  Visit Diagnosis: Abnormal posture  Acute midline low back pain without sciatica  Muscle weakness (generalized)     Problem List Patient Active Problem List   Diagnosis Date Noted  . Acute pain of left shoulder 09/19/2017  . Cataracts, bilateral 07/18/2017  . Prediabetes 01/05/2017  . Physical deconditioning 01/03/2017  . Cough in adult 12/20/2016  . Acute bronchitis 12/13/2016  . Blood glucose elevated 12/13/2016  . Sepsis ([place]) 12/09/2016  . Goals of care, counseling/discussion 11/18/2016  . S/P autologous bone marrow transplantation ([place]) 08/12/2016  . Mild protein-calorie malnutrition ([place]) 08/12/2016  . Dysphagia 08/12/2016  .  Essential hypertension 05/26/2016  . Hypoxia 05/24/2016  . Dehydration 05/23/2016  . Chest pain 05/18/2016  . GERD (gastroesophageal reflux disease) 05/18/2016  . Blepharoconjunctivitis of both eyes 04/21/2016  . Dyspnea 03/25/2016  . Left leg pain 03/24/2016  .  Port catheter in place 02/25/2016  . Pancytopenia due to antineoplastic chemotherapy ([place]) 02/25/2016  . History of syphilis 02/24/2016  . Encounter for antineoplastic chemotherapy 02/09/2016  . Skin rash 12/14/2015  . 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 Anemia due to antineoplastic chemotherapy 09/22/2015  . Antineoplastic chemotherapy induced pancytopenia ([place])   . Bone pain   . Cancer associated pain 09/02/2015  . Multiple myeloma in relapse ([place]) 09/01/2015  . 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 Vitamin D deficiency 07/22/2015  . Closed right clavicular fracture 07/17/2015  . Chest wall pain 03/12/2015  . Thoracolumbar back pain 03/12/2015  . Allergic rhinitis 03/12/2015  . Poor dentition 01/15/2015  . Nerve damage 01/15/2015  . Esophagitis 12/28/2014  . Lower GI bleed   . Hemorrhoids 11/06/2014  . HTN (hypertension) 11/06/2014  . Priapism 11/06/2014  . Decreased visual acuity 11/06/2014  . Family history of prostate cancer in father 11/06/2014    Golda Acre 12/06/2017, 9:42 AM  [place], [place], [address] Phone: [phone]   Fax:  [phone]  Name: Erik Nguyen MRN: [id number] Date of Birth: May 22, 1956

## 2017-12-08 ENCOUNTER — Ambulatory Visit: Payer: Medicaid Other | Admitting: Physical Therapy

## 2017-12-08 DIAGNOSIS — R293 Abnormal posture: Secondary | ICD-10-CM

## 2017-12-08 DIAGNOSIS — M545 Low back pain, unspecified: Secondary | ICD-10-CM

## 2017-12-08 NOTE — Therapy (Signed)
[place], [place], [address] Phone: [phone]   Fax:  [phone]  Physical Therapy Treatment  Patient Details  Name: Erik Nguyen MRN: [id number] Date of Birth: Apr 04, 1956 Referring Provider: Dr. Alvy Bimler    Encounter Date: 12/08/2017  PT End of Session - 12/08/17 1237    Visit Number  8    Number of Visits  16    Date for PT Re-Evaluation  12/25/17    Authorization Type  Medicaid     Authorization Time Period  6 weeks- 11/20/17 to 12/31/17    Authorization - Number of Visits  15    PT Start Time  0850    PT Stop Time  0932    PT Time Calculation (min)  42 min    Activity Tolerance  Patient tolerated treatment well    Behavior During Therapy  [facility] for tasks assessed/performed       Past Medical History:  Diagnosis Date  . Dyspnea    exertion   . Encounter for antineoplastic chemotherapy 02/09/2016  . GERD (gastroesophageal reflux disease)   . History of syphilis 02/24/2016  . Hypertension Dx 2016  . Multiple myeloma not having achieved remission ([place]) 09/01/2015  . Pre-diabetes   . Priapism 2014     Past Surgical History:  Procedure Laterality Date  . COLONOSCOPY    . POLYPECTOMY      There were no vitals filed for this visit.  Subjective Assessment - 12/08/17 0853    Subjective  "Just in my lower back it's a little stiff and this (left) leg, I've got a little pain in it." Relief from last visit lasted until he went to bed. Having some problem with left shoulder, having flared up last night.    Pertinent History  Muliple myeloma with  bone marrow transplant 07/27/2016 and has had complete respone to treatment     Patient Stated Goals  To get some relief from his back pain, get stronger    Currently in Pain?  Yes    Pain Score  5  0 with sitting down    Pain Location  Leg    Pain Orientation  Left    Pain Descriptors / Indicators  Dull    Pain Relieving Factors  time passing                        [facility] Adult PT Treatment/Exercise - 12/08/17 0001      Lumbar Exercises: Stretches   Passive Hamstring Stretch  Right;Left;20 seconds;2 reps    Single Knee to Chest Stretch  Right;Left;3 reps;20 seconds    Double Knee to Chest Stretch  30 seconds;2 reps    Lower Trunk Rotation  30 seconds;2 reps      Lumbar Exercises: Aerobic   Recumbent Bike  Level 3 x 5 mins. with RPE of 7-8/10 and report of mild dyspnea SpO2 91%, but quickly increased to mid-90s; HR 105      Moist Heat Therapy   Number Minutes Moist Heat  15 Minutes on heat while stretching    Moist Heat Location  Lumbar Spine      Manual Therapy   Manual Therapy  Other (comment)    Other Manual Therapy  soft tissue work with Biotone in left sidelying to lower thoracic and lumbar paraspinals and upper gluteal muscle areas for pain relief and muscle relaxation tight at paraspinals, especially right side  PT Long Term Goals - 11/13/17 0840      PT LONG TERM GOAL #1   Title  Pt will report that his standing tolerance is increased so that he can do the dishes at home     Baseline  Pt cannot stand long enough to do the dishes; 50% improvement with this-11/13/17    Status  On-going      PT LONG TERM GOAL #2   Title  Pt will be indedependent in a home exericise program for lumbar stretching and core strengthening     Baseline  no knowledge; pt is independent with HEP thus far-11/13/17    Status  Partially Met      PT LONG TERM GOAL #3   Title  Pt will report the pain in his low back is decreased to a 3/10     Baseline  pain was all day but now pt reports only having 2-3/10 when consistent with HEP at night and upon waking-    Status  Achieved      PT LONG TERM GOAL #4   Title  Pt will decrease his TUG  to < 8 sec     Baseline  9.45 sec ; 8.96 sec-11/13/17    Status  On-going            Plan - 12/08/17 1238    Clinical Impression Statement  Pt. came in with  stiffness and left leg pain again today. Treatment similar to last session was done and patient did feel better at end of session.  He plans to do his home exercise program over the weekend, and was encouraged to do this. He also c/o left shoulder pain, but was told we could not address this unless Dr. Alvy Bimler approved, as initial referral was for back pain.    Rehab Potential  Good    Clinical Impairments Affecting Rehab Potential  perception that activity may cause injury due to previous bone "softening" from multipe myeloma     PT Frequency  2x / week    PT Duration  6 weeks    PT Treatment/Interventions  ADLs/Self Care Home Management;Balance training;Moist Heat;Therapeutic exercise;Therapeutic activities;Patient/family education;Manual techniques;Taping;Electrical Stimulation    PT Next Visit Plan  Check goals. Cont to progress core strength in varying positions and lumbar/hip flexibility; review body mechanics education prn/ask how pt is doing incorporating this into his day. Prn manual therapy and moist heat to low back. Cont working towards community program       Patient will benefit from skilled therapeutic intervention in order to improve the following deficits and impairments:  Abnormal gait, Increased fascial restricitons, Pain, Improper body mechanics, Postural dysfunction, Increased muscle spasms, Decreased mobility, Decreased activity tolerance, Decreased endurance, Decreased strength, Impaired perceived functional ability, Obesity, Difficulty walking  Visit Diagnosis: Abnormal posture - Plan: PT plan of care cert/re-cert  Acute midline low back pain without sciatica - Plan: PT plan of care cert/re-cert     Problem List Patient Active Problem List   Diagnosis Date Noted  . Acute pain of left shoulder 09/19/2017  . Cataracts, bilateral 07/18/2017  . Prediabetes 01/05/2017  . Physical deconditioning 01/03/2017  . Cough in adult 12/20/2016  . Acute bronchitis 12/13/2016  .  Blood glucose elevated 12/13/2016  . Sepsis ([place]) 12/09/2016  . Goals of care, counseling/discussion 11/18/2016  . S/P autologous bone marrow transplantation ([place]) 08/12/2016  . Mild protein-calorie malnutrition ([place]) 08/12/2016  . Dysphagia 08/12/2016  . Essential hypertension 05/26/2016  . Hypoxia 05/24/2016  .  Dehydration 05/23/2016  . Chest pain 05/18/2016  . GERD (gastroesophageal reflux disease) 05/18/2016  . Blepharoconjunctivitis of both eyes 04/21/2016  . Dyspnea 03/25/2016  . Left leg pain 03/24/2016  . Port catheter in place 02/25/2016  . Pancytopenia due to antineoplastic chemotherapy ([place]) 02/25/2016  . History of syphilis 02/24/2016  . Encounter for antineoplastic chemotherapy 02/09/2016  . Skin rash 12/14/2015  . 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 Anemia due to antineoplastic chemotherapy 09/22/2015  . Antineoplastic chemotherapy induced pancytopenia ([place])   . Bone pain   . Cancer associated pain 09/02/2015  . Multiple myeloma in relapse ([place]) 09/01/2015  . 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 Vitamin D deficiency 07/22/2015  . Closed right clavicular fracture 07/17/2015  . Chest wall pain 03/12/2015  . Thoracolumbar back pain 03/12/2015  . Allergic rhinitis 03/12/2015  . Poor dentition 01/15/2015  . Nerve damage 01/15/2015  . Esophagitis 12/28/2014  . Lower GI bleed   . Hemorrhoids 11/06/2014  . HTN (hypertension) 11/06/2014  . Priapism 11/06/2014  . Decreased visual acuity 11/06/2014  . Family history of prostate cancer in father 11/06/2014    [facility] 12/08/2017, 12:45 PM  [place], [place], [address] Phone: [phone]   Fax:  [phone]  Name: Erik Nguyen MRN: [id number] Date of Birth: Oct 27, 1955  Serafina Royals, PT 12/08/17 12:45 PM

## 2017-12-11 ENCOUNTER — Ambulatory Visit: Payer: Medicaid Other | Admitting: Physical Therapy

## 2017-12-11 DIAGNOSIS — M545 Low back pain, unspecified: Secondary | ICD-10-CM

## 2017-12-11 DIAGNOSIS — R293 Abnormal posture: Secondary | ICD-10-CM

## 2017-12-11 DIAGNOSIS — M6281 Muscle weakness (generalized): Secondary | ICD-10-CM
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
## 2017-12-11 NOTE — Patient Instructions (Signed)
   PELVIC TILT  Lie on back, legs bent. Exhale, tilting top of pelvis back, pubic bone up, to flatten lower back. Inhale, rolling pelvis opposite way, top forward, pubic bone down, arch in back. Repeat __10__ times. Do __2__ sessions per day. Copyright  VHI. All rights reserved.    Isometric Hold With Pelvic Floor (Hook-Lying)  Lie with hips and knees bent. Slowly inhale, and then exhale. Pull navel toward spine and tighten pelvic floor. Hold for __10_ seconds. Continue to breathe in and out during hold. Rest for _10__ seconds. Repeat __10_ times. Do __2-3_ times a day.   Knee Fold  Lie on back, legs bent, arms by sides. Exhale, lifting knee to chest. Inhale, returning. Keep abdominals flat, navel to spine. Repeat __10__ times, alternating legs. Do __2__ sessions per day.  Knee Drop  Keep pelvis stable. Without rotating hips, slowly drop knee to side, pause, return to center, bring knee across midline toward opposite hip. Feel obliques engaging. Repeat for ___10_ times each leg.   Copyright  VHI. All rights reserved.       Heel Slide to Straight   Slide one leg down to straight. Return. Be sure pelvis does not rock forward, tilt, rotate, or tip to side. Do _10__ times. Restabilize pelvis. Repeat with other leg. Do __1-2_ sets, __2_ times per day.  http://ss.exer.[email or web address]   Copyright  VHI. All rights reserved.    Bracing With Arm / Leg Raise (Quadruped)    Sobre manos y piernas. Espalda en posicin neutral. Tensione el piso plvico y abdominales y [place]. Extienda brazo a la altura del hombro y pierna opuesta a la altura de la cadera, alternando. Repita ___ veces. Realice ___ veces al da.  Copyright  VHI. All rights reserved.

## 2017-12-12 ENCOUNTER — Encounter: Payer: Self-pay | Admitting: Hematology and Oncology

## 2017-12-12 ENCOUNTER — Telehealth: Payer: Self-pay | Admitting: Hematology and Oncology

## 2017-12-12 ENCOUNTER — Inpatient Hospital Stay (HOSPITAL_BASED_OUTPATIENT_CLINIC_OR_DEPARTMENT_OTHER): Payer: Medicaid Other | Admitting: Hematology and Oncology

## 2017-12-12 ENCOUNTER — Inpatient Hospital Stay: Payer: Medicaid Other

## 2017-12-12 DIAGNOSIS — C9002 Multiple myeloma in relapse: Secondary | ICD-10-CM

## 2017-12-12 DIAGNOSIS — G893 Neoplasm related pain (acute) (chronic): Secondary | ICD-10-CM | POA: Diagnosis not present

## 2017-12-12 DIAGNOSIS — Z7982 Long term (current) use of aspirin: Secondary | ICD-10-CM

## 2017-12-12 DIAGNOSIS — Z5112 Encounter for antineoplastic immunotherapy: Secondary | ICD-10-CM | POA: Diagnosis not present

## 2017-12-12 DIAGNOSIS — Z9481 Bone marrow transplant status: Secondary | ICD-10-CM | POA: Diagnosis not present

## 2017-12-12 LAB — COMPREHENSIVE METABOLIC PANEL
ALBUMIN: 4.1 g/dL (ref 3.5–5.0)
ALK PHOS: 44 U/L (ref 40–150)
ALT: 14 U/L (ref 0–55)
ANION GAP: 8 (ref 3–11)
AST: 14 U/L (ref 5–34)
BILIRUBIN TOTAL: 0.5 mg/dL (ref 0.2–1.2)
BUN: 14 mg/dL (ref 7–26)
CALCIUM: 9.6 mg/dL (ref 8.4–10.4)
CO2: 25 mmol/L (ref 22–29)
Chloride: 110 mmol/L — ABNORMAL HIGH (ref 98–109)
Creatinine, Ser: 1.18 mg/dL (ref 0.70–1.30)
GFR calc Af Amer: >60 mL/min (ref >60)
GFR calc non Af Amer: >60 mL/min (ref >60)
GLUCOSE: 100 mg/dL (ref 70–140)
Potassium: 3.8 mmol/L (ref 3.5–5.1)
Sodium: 143 mmol/L (ref 136–145)
Total Protein: 6.5 g/dL (ref 6.4–8.3)

## 2017-12-12 LAB — CBC WITH DIFFERENTIAL/PLATELET
BASOS PCT: 1 %
Basophils Absolute: 0 10*3/uL (ref 0.0–0.1)
EOS PCT: 3 %
Eosinophils Absolute: 0.2 10*3/uL (ref 0.0–0.5)
HEMATOCRIT: 41.1 % (ref 38.4–49.9)
Hemoglobin: 14.4 g/dL (ref 13.0–17.1)
LYMPHS PCT: 44 %
Lymphs Abs: 2.2 10*3/uL (ref 0.9–3.3)
MCH: 32.7 pg (ref 27.2–33.4)
MCHC: 35 g/dL (ref 32.0–36.0)
MCV: 93.4 fL (ref 79.3–98.0)
MONO ABS: 0.5 10*3/uL (ref 0.1–0.9)
MONOS PCT: 11 %
NEUTROS ABS: 2 10*3/uL (ref 1.5–6.5)
Neutrophils Relative %: 41 %
PLATELETS: 190 10*3/uL (ref 140–400)
RBC: 4.4 MIL/uL (ref 4.20–5.82)
RDW: 13.2 % (ref 11.0–14.6)
WBC: 5 10*3/uL (ref 4.0–10.3)

## 2017-12-12 MED ORDER — HEPARIN SOD (PORK) LOCK FLUSH 100 UNIT/ML IV SOLN
500.0000 [IU] | Freq: Once | INTRAVENOUS | Status: AC | PRN
  Administered 2017-12-12: 500 [IU]
  Filled 2017-12-12: qty 5

## 2017-12-12 MED ORDER — SODIUM CHLORIDE 0.9 % IV SOLN
Freq: Once | INTRAVENOUS | Status: AC
  Administered 2017-12-12: 11:00:00 via INTRAVENOUS

## 2017-12-12 MED ORDER — MONTELUKAST SODIUM 10 MG PO TABS
10.0000 mg | ORAL_TABLET | Freq: Once | ORAL | Status: AC
  Administered 2017-12-12: 10 mg via ORAL

## 2017-12-12 MED ORDER — DEXAMETHASONE SODIUM PHOSPHATE 10 MG/ML IJ SOLN
10.0000 mg | Freq: Once | INTRAMUSCULAR | Status: AC
  Administered 2017-12-12: 10 mg via INTRAVENOUS

## 2017-12-12 MED ORDER — SODIUM CHLORIDE 0.9% FLUSH
10.0000 mL | Freq: Once | INTRAVENOUS | Status: AC
  Administered 2017-12-12: 10 mL
  Filled 2017-12-12: qty 10

## 2017-12-12 MED ORDER — FAMOTIDINE IN NACL 20-0.9 MG/50ML-% IV SOLN
20.0000 mg | Freq: Once | INTRAVENOUS | Status: AC
  Administered 2017-12-12: 20 mg via INTRAVENOUS

## 2017-12-12 MED ORDER — DARATUMUMAB CHEMO INJECTION 400 MG/20ML
1200.0000 mg | Freq: Once | INTRAVENOUS | Status: AC
  Administered 2017-12-12: 1200 mg via INTRAVENOUS
  Filled 2017-12-12: qty 60

## 2017-12-12 MED ORDER — ACETAMINOPHEN 325 MG PO TABS
650.0000 mg | ORAL_TABLET | Freq: Once | ORAL | Status: AC
  Administered 2017-12-12: 650 mg via ORAL

## 2017-12-12 MED ORDER — ACETAMINOPHEN 325 MG PO TABS
ORAL_TABLET | ORAL | Status: AC
  Filled 2017-12-12: qty 2

## 2017-12-12 MED ORDER — DEXAMETHASONE SODIUM PHOSPHATE 10 MG/ML IJ SOLN
INTRAMUSCULAR | Status: AC
  Filled 2017-12-12: qty 1

## 2017-12-12 MED ORDER — PROCHLORPERAZINE MALEATE 10 MG PO TABS
10.0000 mg | ORAL_TABLET | Freq: Once | ORAL | Status: AC
  Administered 2017-12-12: 10 mg via ORAL

## 2017-12-12 MED ORDER — SODIUM CHLORIDE 0.9% FLUSH
10.0000 mL | INTRAVENOUS | Status: DC | PRN
  Administered 2017-12-12: 10 mL
  Filled 2017-12-12: qty 10

## 2017-12-12 MED ORDER — PROCHLORPERAZINE MALEATE 10 MG PO TABS
ORAL_TABLET | ORAL | Status: AC
  Filled 2017-12-12: qty 1

## 2017-12-12 MED ORDER — FAMOTIDINE IN NACL 20-0.9 MG/50ML-% IV SOLN
INTRAVENOUS | Status: AC
  Filled 2017-12-12: qty 50

## 2017-12-12 MED ORDER — MONTELUKAST SODIUM 10 MG PO TABS
ORAL_TABLET | ORAL | Status: AC
  Filled 2017-12-12: qty 1

## 2017-12-12 NOTE — Assessment & Plan Note (Signed)
Recent bone marrow biopsy showed complete remission He will continue antimicrobial treatment as prescribed He has appointment to meet with his transplant physician as scheduled

## 2017-12-12 NOTE — Patient Instructions (Signed)
[facility] Discharge Instructions for Patients Receiving Chemotherapy  Today you received the following chemotherapy agents: Daratumumab (Darzalex)   To help prevent nausea and vomiting after your treatment, we encourage you to take your nausea medication  as prescribed.    If you develop nausea and vomiting that is not controlled by your nausea medication, call the clinic.   BELOW ARE SYMPTOMS THAT SHOULD BE REPORTED IMMEDIATELY:  *FEVER GREATER THAN 100.5 F  *CHILLS WITH OR WITHOUT FEVER  NAUSEA AND VOMITING THAT IS NOT CONTROLLED WITH YOUR NAUSEA MEDICATION  *UNUSUAL SHORTNESS OF BREATH  *UNUSUAL BRUISING OR BLEEDING  TENDERNESS IN MOUTH AND THROAT WITH OR WITHOUT PRESENCE OF ULCERS  *URINARY PROBLEMS  *BOWEL PROBLEMS  UNUSUAL RASH Items with * indicate a potential emergency and should be followed up as soon as possible.  Feel free to call the clinic should you have any questions or concerns. The clinic phone number is [phone].  Please show the CHEMO ALERT CARD at check-in to the Emergency Department and triage nurse.   

## 2017-12-12 NOTE — Progress Notes (Signed)
[facility] OFFICE PROGRESS NOTE  Patient Care Team: Newlin, Enobong, MD as PCP - General (Family Medicine) Rodriguez Valdes, Cesar, MD as Consulting Physician (Hematology and Oncology)  ASSESSMENT & PLAN:  Multiple myeloma in relapse (HCC) Recent myeloma panel confirmed that the patient has achieved complete response to treatment He will continue monthly daratumumab with Pomalyst, to be taken for 21 days and 7 days off He has recently completed dexamethasone taper He is taking aspirin for DVT prophylaxis He is reminded to take calcium, vitamin D and will receive Zometa every 3 months He will continue antimicrobial treatment with acyclovir and aspirin for DVT prophylaxis He had no recent dental issues  S/P autologous bone marrow transplantation (HCC) Recent bone marrow biopsy showed complete remission He will continue antimicrobial treatment as prescribed He has appointment to meet with his transplant physician as scheduled  Cancer associated pain He has chronic musculoskeletal pain X ray showed degenerative arthritis He will continue over-the-counter analgesics and tramadol as needed   No orders of the defined types were placed in this encounter.   INTERVAL HISTORY: Please see below for problem oriented charting. He returns for chemotherapy and follow-up He tolerated recent treatment well He has lost some weight due to dietary modification He denies worsening bone pain Denies recent infection  SUMMARY OF ONCOLOGIC HISTORY:   Multiple myeloma in relapse (HCC)   08/28/2015 Bone Marrow Biopsy    Accession: [id number] biopsy showed 58% myeloma involvement. Cytogenetics was normal and FISH showed Del 13q and t(11;14).       09/07/2015 - 01/22/2016 Chemotherapy    he received 6 cycles of treatment of Velcade and Revlimid with Dex       09/12/2015 - 09/16/2015 Hospital Admission    He was admitted to the hospital with sepsis, treatment was placed on hold and he  received one dose of GCSF for leukopenia      01/12/2016 Adverse Reaction    Delay treatment due to neutropenia      01/27/2016 Imaging    ECHO at [facility] showed normal EF      01/27/2016 PET scan    PET scan at [facility] showed innumerable lytic lesions throughout      01/27/2016 Procedure    PFT is within normal limits at [facility]      01/28/2016 Bone Marrow Biopsy    Bone marrow at [facility] showed persistent plasma cell myeloma in a normocellularmarrow (30%) with 20% atypical plasma cells and myeloid hyperplasia.      02/11/2016 - 06/10/2016 Chemotherapy    He is started on salvage Rx with Kyprolis, Cytoxan and dexamethasone      03/24/2016 Imaging    US venous Doppler showed no evidence of deep vein or superficial thrombosis involving the right lower extremity and left lower extremity. No evidence of Baker&'s cyst on the right or left.      05/18/2016 - 05/19/2016 Hospital Admission    He was admitted for chest pain evaluation. Cardiac work-up excluded cardiac events and CT angiogram were negative for PE. It showed esphageal thickening c/w reflux esophagitis likely cause for his chest discomfort      05/23/2016 - 05/25/2016 Hospital Admission    Patient was hospitalized for worsening shortness of breath and worsening pain. CT angiogram chest negative for pulmonary emboli. Patient had a recent normal 2-D echo and ruled out for MI. Patient noted to be dehydrated. Patient admitted placed on IV fluids. Diuretic discontinued. Patient's gabapentin has been increased per   recommendations from prior office visit from patient's oncologist to 600 mg 3 times daily      06/15/2016 Bone Marrow Biopsy    Bone marrow biopsy showed only 2% plasma cell, normal FISH and cytogenetics      07/26/2016 - 07/26/2016 Chemotherapy    He received high dose melphalan at [facility]       07/27/2016 Bone Marrow Transplant    He received autologous stem cell transplant at [facility]      08/05/2016 -  08/10/2016 Hospital Admission    He was admitted to [facility] for management of neutropenic fever and diarrhea. Cultures were negative. Stool culture came back positive for Novovirus and he was managed conservatively.      11/03/2016 Bone Marrow Biopsy    Bone Marrow Flow Cytometry - NO MONOCLONAL B CELL POPULATION IDENTIFIED. - PREDOMINANCE OF T CELLS WITH NON SPECIFIC CHANGES. - SEE NOTE. Diagnosis Comment: Analysis of the lymphoid population shows predominance of T lymphocytes expressing pan T-cell antigens but with relative abundance of CD8 positive cells and reversal of the CD4:CD8 ratio. No significant CD16/56 expression is identified. The T-cell changes are not considered specific in this setting. B cells represent the minor population with no monoclonality or abnormal phenotype. 4% lambda light chain restricted plasma cells. FISH positive CCND1/IGH 12% of cells.       11/09/2016 PET scan    No FDG avid myeloma is identified. Numerous osseous lytic lesions as above. 2.Hypermetabolic left level III lymph node, likely reactive etiology.  3.Ancillary CT findings as above.      11/29/2016 -  Chemotherapy    He received treatment with daratumumab and Pomalyst       12/13/2016 - 12/16/2016 Hospital Admission    He was admitted to the hospital for management of dehydration and early sepsis      01/23/2017 Procedure    Status post CT-guided bone marrow biopsy, with tissue specimen sent to pathology for complete histopathologic analysis      01/23/2017 Bone Marrow Biopsy    Bone Marrow, Aspirate,Biopsy, and Clot - SLIGHTLY HYPOCELLULAR BONE MARROW FOR AGE WITH TRILINEAGE HEMATOPOIESIS AND 1% PLASMA CELLS. - SEE COMMENT. PERIPHERAL BLOOD: - NORMOCYTIC-NORMOCHROMIC ANEMIA. - LEUKOPENIA. Diagnosis Note The bone marrow is slightly hypocellular for age with trilineage hematopoiesis and non specific myeloid changes likely related to previous treatment. The plasma cells represent 1% of  all cells in the aspirate with lack of large aggregate or sheets. To further evaluate the plasma cell component, immunohistochemical stains will be performed and the results reported in an addendum. Correlation with cytogenetic and FISH studies recommended.      01/31/2017 Adverse Reaction    Treatment was delayed due to SOB      01/31/2017 Imaging    1. No evidence of pulmonary embolism. Sensitivity is moderately degraded by respiratory motion. 2. Multiple myeloma.  Stable compression fracture at T9. 3. Coronary artery calcifications      10/17/2017 Imaging    No acute findings.  Mild spondylosis of the lumbar spinal mild multilevel disc disease.       REVIEW OF SYSTEMS:   Constitutional: Denies fevers, chills or abnormal weight loss Eyes: Denies blurriness of vision Ears, nose, mouth, throat, and face: Denies mucositis or sore throat Respiratory: Denies cough, dyspnea or wheezes Cardiovascular: Denies palpitation, chest discomfort or lower extremity swelling Gastrointestinal:  Denies nausea, heartburn or change in bowel habits Skin: Denies abnormal skin rashes Lymphatics: Denies new lymphadenopathy or easy bruising Neurological:Denies numbness, tingling or new   weaknesses Behavioral/Psych: Mood is stable, no new changes  All other systems were reviewed with the patient and are negative.  I have reviewed the past medical history, past surgical history, social history and family history with the patient and they are unchanged from previous note.  ALLERGIES:  is allergic to benadryl [diphenhydramine]; trazodone and nefazodone; and latex.  MEDICATIONS:  Current Outpatient Medications  Medication Sig Dispense Refill  . acetaminophen (TYLENOL) 500 MG tablet Take 1,000 mg by mouth every 6 (six) hours as needed for fever.    . acyclovir (ZOVIRAX) 400 MG tablet Take 1 tablet (400 mg total) by mouth 2 (two) times daily. 60 tablet 6  . aluminum-magnesium hydroxide-simethicone (MAALOX)  200-200-20 MG/5ML SUSP Take 30 mLs by mouth every 4 (four) hours as needed.    . aspirin 81 MG chewable tablet Chew 81 mg by mouth daily.    . CALCIUM PO Take 1 tablet by mouth daily.    . cholecalciferol (VITAMIN D) 1000 units tablet Take 1,000 Units by mouth daily.     . folic acid (FOLVITE) 1 MG tablet TAKE 1 TABLET BY MOUTH ONCE DAILY 90 tablet 1  . gabapentin (NEURONTIN) 300 MG capsule Take 1-2 capsules (300-600 mg total) by mouth 2 (two) times daily. Take 300 mg in the morning and 600 mg at night. 100 capsule 9  . hydrochlorothiazide (HYDRODIURIL) 25 MG tablet Take 25 mg by mouth daily.    . lidocaine-prilocaine (EMLA) cream Apply to affected area once 30 g 3  . lisinopril (PRINIVIL,ZESTRIL) 30 MG tablet Take 1 tablet (30 mg total) by mouth daily. 30 tablet 0  . loperamide (IMODIUM) 2 MG capsule Take 2 mg by mouth 4 (four) times daily as needed.    . metFORMIN (GLUCOPHAGE) 500 MG tablet Take 500 mg by mouth daily as needed.    . Multiple Vitamin (MULTIVITAMIN) tablet Take 1 tablet by mouth daily.  1  . ondansetron (ZOFRAN) 8 MG tablet Take 8 mg by mouth every 8 (eight) hours as needed.    . pomalidomide (POMALYST) 2 MG capsule Take 1 capsule (2 mg total) by mouth daily. Take with water on days 1-21. Repeat every 28 days. (Patient not taking: Reported on 10/11/2017) 21 capsule 0  . prochlorperazine (COMPAZINE) 10 MG tablet Take 10 mg by mouth every 6 (six) hours as needed.    . ranitidine (ZANTAC) 150 MG tablet TAKE 1 TABLET BY MOUTH ONCE DAILY AT BEDTIME 30 tablet 1  . tiZANidine (ZANAFLEX) 4 MG tablet Take 1 tablet (4 mg total) by mouth every 8 (eight) hours as needed for muscle spasms. 60 tablet 0  . traMADol (ULTRAM) 50 MG tablet Take 1-2 tablets (50-100 mg total) by mouth every 6 (six) hours as needed. for pain (Patient not taking: Reported on 10/23/2017) 90 tablet 0  . zolpidem (AMBIEN) 10 MG tablet Take 1 tablet (10 mg total) by mouth at bedtime as needed for sleep. 30 tablet 3   No  current facility-administered medications for this visit.    Facility-Administered Medications Ordered in Other Visits  Medication Dose Route Frequency Provider Last Rate Last Dose  . heparin lock flush 100 unit/mL  500 Units Intracatheter Once PRN Gorsuch, Ni, MD      . heparin lock flush 100 unit/mL  500 Units Intracatheter Once PRN Gorsuch, Ni, MD      . sodium chloride flush (NS) 0.9 % injection 10 mL  10 mL Intracatheter PRN Gorsuch, Ni, MD   10 mL at 03/21/17   0924  . sodium chloride flush (NS) 0.9 % injection 10 mL  10 mL Intracatheter PRN Gorsuch, Ni, MD        PHYSICAL EXAMINATION: ECOG PERFORMANCE STATUS: 1 - Symptomatic but completely ambulatory  Vitals:   12/12/17 0947  BP: (!) 120/92  Pulse: 82  Resp: 18  Temp: 98.4 F (36.9 C)  SpO2: 100%   Filed Weights   12/12/17 0947  Weight: 177 lb 11.2 oz (80.6 kg)    GENERAL:alert, no distress and comfortable SKIN: skin color, texture, turgor are normal, no rashes or significant lesions EYES: normal, Conjunctiva are pink and non-injected, sclera clear OROPHARYNX:no exudate, no erythema and lips, buccal mucosa, and tongue normal  NECK: supple, thyroid normal size, non-tender, without nodularity LYMPH:  no palpable lymphadenopathy in the cervical, axillary or inguinal LUNGS: clear to auscultation and percussion with normal breathing effort HEART: regular rate & rhythm and no murmurs and no lower extremity edema ABDOMEN:abdomen soft, non-tender and normal bowel sounds Musculoskeletal:no cyanosis of digits and no clubbing  NEURO: alert & oriented x 3 with fluent speech, no focal motor/sensory deficits  LABORATORY DATA:  I have reviewed the data as listed    Component Value Date/Time   NA 143 12/12/2017 0835   NA 140 07/18/2017 0909   K 3.8 12/12/2017 0835   K 3.6 07/18/2017 0909   CL 110 (H) 12/12/2017 0835   CO2 25 12/12/2017 0835   CO2 24 07/18/2017 0909   GLUCOSE 100 12/12/2017 0835   GLUCOSE 109 07/18/2017 0909    BUN 14 12/12/2017 0835   BUN 12.1 07/18/2017 0909   CREATININE 1.18 12/12/2017 0835   CREATININE 1.3 07/18/2017 0909   CALCIUM 9.6 12/12/2017 0835   CALCIUM 9.3 07/18/2017 0909   PROT 6.5 12/12/2017 0835   PROT 6.4 07/18/2017 0909   PROT 6.7 07/18/2017 0909   ALBUMIN 4.1 12/12/2017 0835   ALBUMIN 3.9 07/18/2017 0909   AST 14 12/12/2017 0835   AST 13 07/18/2017 0909   ALT 14 12/12/2017 0835   ALT 15 07/18/2017 0909   ALKPHOS 44 12/12/2017 0835   ALKPHOS 49 07/18/2017 0909   BILITOT 0.5 12/12/2017 0835   BILITOT 0.50 07/18/2017 0909   GFRNONAA >60 12/12/2017 0835   GFRNONAA 71 07/24/2015 1155   GFRAA >60 12/12/2017 0835   GFRAA 82 07/24/2015 1155    No results found for: SPEP, UPEP  Lab Results  Component Value Date   WBC 5.0 12/12/2017   NEUTROABS 2.0 12/12/2017   HGB 14.4 12/12/2017   HCT 41.1 12/12/2017   MCV 93.4 12/12/2017   PLT 190 12/12/2017      Chemistry      Component Value Date/Time   NA 143 12/12/2017 0835   NA 140 07/18/2017 0909   K 3.8 12/12/2017 0835   K 3.6 07/18/2017 0909   CL 110 (H) 12/12/2017 0835   CO2 25 12/12/2017 0835   CO2 24 07/18/2017 0909   BUN 14 12/12/2017 0835   BUN 12.1 07/18/2017 0909   CREATININE 1.18 12/12/2017 0835   CREATININE 1.3 07/18/2017 0909      Component Value Date/Time   CALCIUM 9.6 12/12/2017 0835   CALCIUM 9.3 07/18/2017 0909   ALKPHOS 44 12/12/2017 0835   ALKPHOS 49 07/18/2017 0909   AST 14 12/12/2017 0835   AST 13 07/18/2017 0909   ALT 14 12/12/2017 0835   ALT 15 07/18/2017 0909   BILITOT 0.5 12/12/2017 0835   BILITOT 0.50 07/18/2017 0909         All questions were answered. The patient knows to call the clinic with any problems, questions or concerns. No barriers to learning was detected.  I spent 15 minutes counseling the patient face to face. The total time spent in the appointment was 20 minutes and more than 50% was on counseling and review of test results  Heath Lark, MD 12/12/2017 1:12 PM

## 2017-12-12 NOTE — Assessment & Plan Note (Signed)
Recent myeloma panel confirmed that the patient has achieved complete response to treatment He will continue monthly daratumumab with Pomalyst, to be taken for 21 days and 7 days off He has recently completed dexamethasone taper He is taking aspirin for DVT prophylaxis He is reminded to take calcium, vitamin D and will receive Zometa every 3 months He will continue antimicrobial treatment with acyclovir and aspirin for DVT prophylaxis He had no recent dental issues

## 2017-12-12 NOTE — Telephone Encounter (Signed)
Gave patient AVs and calendar of upcoming May appointments.  °

## 2017-12-12 NOTE — Assessment & Plan Note (Signed)
He has chronic musculoskeletal pain X ray showed degenerative arthritis He will continue over-the-counter analgesics and tramadol as needed

## 2017-12-13 ENCOUNTER — Ambulatory Visit: Payer: Medicaid Other | Attending: Hematology and Oncology

## 2017-12-13 DIAGNOSIS — M545 Low back pain, unspecified: Secondary | ICD-10-CM

## 2017-12-13 DIAGNOSIS — M6281 Muscle weakness (generalized): Secondary | ICD-10-CM | POA: Diagnosis present

## 2017-12-13 DIAGNOSIS — R293 Abnormal posture: Secondary | ICD-10-CM | POA: Insufficient documentation

## 2017-12-13 NOTE — Therapy (Addendum)
Progress Note Reporting Period 10/23/17 to 12/13/17  See note below for Objective Data and Assessment of Progress/Goals.       [place], [place], [address] Phone: [phone]   Fax:  [phone]  Physical Therapy Treatment  Patient Details  Name: Erik Nguyen MRN: [id number] Date of Birth: [birth year] Referring Provider: Dr. Alvy Bimler    Encounter Date: 12/13/2017  PT End of Session - 12/13/17 0915    Visit Number  10    Number of Visits  16    Date for PT Re-Evaluation  12/25/17    PT Start Time  0851    PT Stop Time  0932    PT Time Calculation (min)  41 min    Activity Tolerance  Patient tolerated treatment well    Behavior During Therapy  [facility] for tasks assessed/performed       Past Medical History:  Diagnosis Date  . Dyspnea    exertion   . Encounter for antineoplastic chemotherapy 02/09/2016  . GERD (gastroesophageal reflux disease)   . History of syphilis 02/24/2016  . Hypertension Dx 2016  . Multiple myeloma not having achieved remission ([place]) 09/01/2015  . Pre-diabetes   . Priapism 2014     Past Surgical History:  Procedure Laterality Date  . COLONOSCOPY    . POLYPECTOMY      There were no vitals filed for this visit.  Subjective Assessment - 12/13/17 0852    Subjective  I'm doing better today. My back feels just a little tight, but other than that I'm feeling good.     Pertinent History  Muliple myeloma with  bone marrow transplant 07/27/2016 and has had complete respone to treatment     Patient Stated Goals  To get some relief from his back pain, get stronger    Currently in Pain?  No/denies                       [facility] Adult PT Treatment/Exercise - 12/13/17 0001      Lumbar Exercises: Stretches   Passive Hamstring Stretch  Right;Left;20 seconds;2 reps In sitting today for variation during day; then in supine    Single Knee to Chest Stretch  Right;Left;2  reps;20 seconds then 1 rep more each at end of session    Lower Trunk Rotation  2 reps;20 seconds therapist monitoring technique    Standing Extension  3 reps;10 seconds at end of session    Prone on Elbows Stretch  5 reps 5 sec holds    Piriformis Stretch  Right;Left;2 reps;20 seconds Assessing technique    Piriformis Stretch Limitations  Pt able to perform this today, without pain, but still with mod-max tightness at hip and hamstring reported. Encouraged him to do this multiple times throughout day as he reports he'd like to be able to sit with legs crossed like this again as he used to.       Lumbar Exercises: Aerobic   Recumbent Bike  Level 3, 4 mins monitoring pts tolerance, increased LE fatigue today made him stop earlier      Lumbar Exercises: Seated   Other Seated Lumbar Exercises  Russian twists 10 times, VCs and demonstration for correct technique which pt was able to return (seated edge of mat, posterior pelvic tilt then lean back slightly to rotate to each side, then return to start position)      Lumbar Exercises: Supine   Bridge  10 reps  Bridge with clamshell  10 reps    Bridge with March  10 reps    Other Supine Lumbar Exercises  Reverse Curl 10 times but needed mod tactile and VCs for correct technique. Pt was able to return correct demonstration but reported not sure he felt he was doing it right so did not add to HEP.       Lumbar Exercises: Sidelying   Clam  Right;Left;10 reps    Hip Abduction  Right;Left;10 reps    Hip Abduction Limitations  VCs for both S/L exs for abdominal tightness and pelvic stability throughout      Lumbar Exercises: Quadruped   Opposite Arm/Leg Raise  1 second;10 reps;Right arm/Left leg;Left arm/Right leg;Limitations    Opposite Arm/Leg Raise Limitations  Tactile and VCs for pelvic stability, pt able to return corect demonstration after first 5 times.                  PT Long Term Goals - 12/11/17 1546      PT LONG TERM GOAL  #1   Title  Pt will report that his standing tolerance is increased so that he can do the dishes at home     Baseline  Pt cannot stand long enough to do the dishes; 50% improvement with this-11/13/17. He can do the dishes as of 12/11/17, but it is painful; now down to 30% improved.    Status  On-going      PT LONG TERM GOAL #2   Title  Pt will be indedependent in a home exericise program for lumbar stretching and core strengthening     Status  Achieved      PT LONG TERM GOAL #3   Title  Pt will report the pain in his low back is decreased to a 3/10     Status  Achieved      PT LONG TERM GOAL #4   Title  Pt will decrease his TUG  to < 8 sec     Baseline  9.45 sec ; 8.96 sec-11/13/17; 7 seconds on on second try on 12/11/17    Status  Achieved            Plan - 12/13/17 0933    Clinical Impression Statement  Pt continues to show great improvement with each visit. He reports shoulder has not bothered him anymore. Continued with core strength and progressed pt to add a few new exercises today which he tolerated very well though did require tactile and VCs for correct technique. Pt continues to show great benefit from therapy guided core strengthening exercises and reports benefit of these as he continues to learn what to do on his own.     Rehab Potential  Good    Clinical Impairments Affecting Rehab Potential  perception that activity may cause injury due to previous bone "softening" from multipe myeloma     PT Frequency  2x / week    PT Duration  6 weeks    PT Treatment/Interventions  ADLs/Self Care Home Management;Balance training;Moist Heat;Therapeutic exercise;Therapeutic activities;Patient/family education;Manual techniques;Taping;Electrical Stimulation    PT Next Visit Plan  Cont to progress core strength in varying positions and lumbar/hip flexibility;. Prn manual therapy and moist heat to low back. Cont working towards [employer], though patient does not currently have a gym or  equipment available to him.  He plans to move to [place] at the end of June, and hopes to have equipment or gym membership then.    PT Home  Exercise Plan  lumbar stretching and strengthening exercises given in HEP    Consulted and Agree with Plan of Care  Patient       Patient will benefit from skilled therapeutic intervention in order to improve the following deficits and impairments:  Abnormal gait, Increased fascial restricitons, Pain, Improper body mechanics, Postural dysfunction, Increased muscle spasms, Decreased mobility, Decreased activity tolerance, Decreased endurance, Decreased strength, Impaired perceived functional ability, Obesity, Difficulty walking  Visit Diagnosis: Abnormal posture  Acute midline low back pain without sciatica  Muscle weakness (generalized)     Problem List Patient Active Problem List   Diagnosis Date Noted  . Acute pain of left shoulder 09/19/2017  . Cataracts, bilateral 07/18/2017  . Prediabetes 01/05/2017  . Physical deconditioning 01/03/2017  . Cough in adult 12/20/2016  . Acute bronchitis 12/13/2016  . Blood glucose elevated 12/13/2016  . Sepsis ([place]) 12/09/2016  . Goals of care, counseling/discussion 11/18/2016  . S/P autologous bone marrow transplantation ([place]) 08/12/2016  . Mild protein-calorie malnutrition ([place]) 08/12/2016  . Dysphagia 08/12/2016  . Essential hypertension 05/26/2016  . Hypoxia 05/24/2016  . Dehydration 05/23/2016  . Chest pain 05/18/2016  . GERD (gastroesophageal reflux disease) 05/18/2016  . Blepharoconjunctivitis of both eyes 04/21/2016  . Dyspnea 03/25/2016  . Left leg pain 03/24/2016  . Port catheter in place 02/25/2016  . Pancytopenia due to antineoplastic chemotherapy ([place]) 02/25/2016  . History of syphilis 02/24/2016  . Encounter for antineoplastic chemotherapy 02/09/2016  . Skin rash 12/14/2015  . 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 Anemia due to antineoplastic chemotherapy  09/22/2015  . Antineoplastic chemotherapy induced pancytopenia ([place])   . Bone pain   . Cancer associated pain 09/02/2015  . Multiple myeloma in relapse ([place]) 09/01/2015  . 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 Vitamin D deficiency 07/22/2015  . Closed right clavicular fracture 07/17/2015  . Chest wall pain 03/12/2015  . Thoracolumbar back pain 03/12/2015  . Allergic rhinitis 03/12/2015  . Poor dentition 01/15/2015  . Nerve damage 01/15/2015  . Esophagitis 12/28/2014  . Lower GI bleed   . Hemorrhoids 11/06/2014  . HTN (hypertension) 11/06/2014  . Priapism 11/06/2014  . Decreased visual acuity 11/06/2014  . Family history of prostate cancer in father 11/06/2014    Otelia Limes, PTA 12/13/2017, 9:38 AM  [place], [place], [address] Phone: [phone]   Fax:  [phone]  Name: Erik Nguyen MRN: [id number] Date of Birth: [birth year]  Serafina Royals, PT 12/13/17 5:14 PM

## 2017-12-15 LAB — MULTIPLE MYELOMA PANEL, SERUM
ALBUMIN/GLOB SERPL: 1.6 (ref 0.7–1.7)
Albumin SerPl Elph-Mcnc: 3.7 g/dL (ref 2.9–4.4)
Alpha 1: 0.2 g/dL (ref 0.0–0.4)
Alpha2 Glob SerPl Elph-Mcnc: 0.9 g/dL (ref 0.4–1.0)
B-GLOBULIN SERPL ELPH-MCNC: 0.9 g/dL (ref 0.7–1.3)
GAMMA GLOB SERPL ELPH-MCNC: 0.5 g/dL (ref 0.4–1.8)
GLOBULIN, TOTAL: 2.4 g/dL (ref 2.2–3.9)
IgA: 28 mg/dL — ABNORMAL LOW (ref 61–437)
IgG (Immunoglobin G), Serum: 535 mg/dL — ABNORMAL LOW (ref 700–1600)
IgM (Immunoglobulin M), Srm: 14 mg/dL — ABNORMAL LOW (ref 20–172)
Total Protein ELP: 6.1 g/dL (ref 6.0–8.5)

## 2017-12-18 ENCOUNTER — Ambulatory Visit: Payer: Medicaid Other

## 2017-12-18 ENCOUNTER — Telehealth: Payer: Self-pay | Admitting: *Deleted

## 2017-12-18 DIAGNOSIS — M6281 Muscle weakness (generalized): Secondary | ICD-10-CM

## 2017-12-18 DIAGNOSIS — R293 Abnormal posture: Secondary | ICD-10-CM

## 2017-12-18 DIAGNOSIS — M545 Low back pain, unspecified: Secondary | ICD-10-CM

## 2017-12-18 NOTE — Telephone Encounter (Signed)
Faxed ROI to [facility]; release [phone]

## 2017-12-18 NOTE — Therapy (Addendum)
[place], [place], [address] Phone: [phone]   Fax:  [phone]  Physical Therapy Treatment  Patient Details  Name: Erik Nguyen MRN: [id number] Date of Birth: 1956-03-21 Referring Provider: Dr. Alvy Bimler    Encounter Date: 12/18/2017  PT End of Session - 12/18/17 0919    Visit Number  11    Number of Visits  16    Date for PT Re-Evaluation  12/25/17    Authorization Type  Medicaid     Authorization Time Period  6 weeks- 11/20/17 to 12/31/17    Authorization - Number of Visits  15    PT Start Time  0849    PT Stop Time  0931    PT Time Calculation (min)  42 min    Activity Tolerance  Patient tolerated treatment well    Behavior During Therapy  [facility] for tasks assessed/performed       Past Medical History:  Diagnosis Date  . Dyspnea    exertion   . Encounter for antineoplastic chemotherapy 02/09/2016  . GERD (gastroesophageal reflux disease)   . History of syphilis 02/24/2016  . Hypertension Dx 2016  . Multiple myeloma not having achieved remission ([place]) 09/01/2015  . Pre-diabetes   . Priapism 2014     Past Surgical History:  Procedure Laterality Date  . COLONOSCOPY    . POLYPECTOMY      There were no vitals filed for this visit.  Subjective Assessment - 12/18/17 0906    Subjective  My back is tight today, it has been all weekend. I stretched some when it was hurting but not as much as I should have before then. I had to stand alot on Friday and Saturday for a naquet and I know that flared me up.     Pertinent History  Muliple myeloma with  bone marrow transplant 07/27/2016 and has had complete respone to treatment     Patient Stated Goals  To get some relief from his back pain, get stronger    Currently in Pain?  Yes    Pain Score  3     Pain Location  Back    Pain Orientation  Left    Pain Descriptors / Indicators  Sharp    Pain Type  Chronic pain    Pain Radiating Towards  gluts and  posterior hips, also to lower ribs (tightness reported here)    Pain Onset  More than a month ago    Pain Frequency  Intermittent    Aggravating Factors   not stretching as frequnetly as instructed    Pain Relieving Factors  therapy always helps                       [facility] Adult PT Treatment/Exercise - 12/18/17 0001      Lumbar Exercises: Stretches   Passive Hamstring Stretch  Right;Left;20 seconds;2 reps seated and supine 2x each position    Single Knee to Chest Stretch  Right;Left;2 reps;20 seconds    Lower Trunk Rotation  2 reps;20 seconds therapist monitoring technique    Piriformis Stretch  Right;Left;2 reps;20 seconds    Piriformis Stretch Limitations  Pt able to perform this today, no pain at Rt hip, but painful at Lt. Continued to encourage him to work on this more consistently at home to improve flexibility (only performed about 2x since here last)      Lumbar Exercises: Seated   Other Seated Lumbar Exercises  Seated on ball: Pelvic tilts 10x, then side-side weight shifting 10x; slight back lean with cuing for abdominals engaged with alternate marching 10x each leg.      Lumbar Exercises: Supine   Pelvic Tilt  10 reps    Other Supine Lumbar Exercises  With pelvic tilt, opposite UE/LE 2 x 5 (tap knee, stretch into ext, then return to tap knee)      Moist Heat Therapy   Number Minutes Moist Heat  20 Minutes    Moist Heat Location  Lumbar Spine During all supine exercises      Manual Therapy   Other Manual Therapy  soft tissue work with Biotone in left sidelying to lower thoracic and lumbar paraspinals and upper gluteal muscle areas for pain relief and muscle relaxation                  PT Long Term Goals - 12/11/17 1546      PT LONG TERM GOAL #1   Title  Pt will report that his standing tolerance is increased so that he can do the dishes at home     Baseline  Pt cannot stand long enough to do the dishes; 50% improvement with this-11/13/17. He can do  the dishes as of 12/11/17, but it is painful; now down to 30% improved.    Status  On-going      PT LONG TERM GOAL #2   Title  Pt will be indedependent in a home exericise program for lumbar stretching and core strengthening     Status  Achieved      PT LONG TERM GOAL #3   Title  Pt will report the pain in his low back is decreased to a 3/10     Status  Achieved      PT LONG TERM GOAL #4   Title  Pt will decrease his TUG  to < 8 sec     Baseline  9.45 sec ; 8.96 sec-11/13/17; 7 seconds on on second try on 12/11/17    Status  Achieved            Plan - 12/18/17 0933    Clinical Impression Statement  Pt came in reporting increased tightness today. He repors not being as consistent as he could be with stretches but overall knows he has improved well since start of care. He also had a banquet funndraiser he attended Friday and Saturday and knows this played a part in his being more tight over weekend and today.Did remond pt of importance of being more consistent  with HEP stretching so when he has flare up his muscles are already used to stretches and they will be more effective. Pt verbalized understanding this and reports will work on this throughout day.     Rehab Potential  Good    Clinical Impairments Affecting Rehab Potential  perception that activity may cause injury due to previous bone "softening" from multipe myeloma     PT Frequency  2x / week    PT Duration  6 weeks    PT Treatment/Interventions  ADLs/Self Care Home Management;Balance training;Moist Heat;Therapeutic exercise;Therapeutic activities;Patient/family education;Manual techniques;Taping;Electrical Stimulation    PT Next Visit Plan  Cont to progress core strength in varying positions and lumbar/hip flexibility;. Prn manual therapy and moist heat to low back. Cont working towards [employer], though patient does not currently have a gym or equipment available to him.  He plans to move to [place] at the end of June, and  hopes to have equipment or gym membership then.    Consulted and Agree with Plan of Care  Patient       Patient will benefit from skilled therapeutic intervention in order to improve the following deficits and impairments:  Abnormal gait, Increased fascial restricitons, Pain, Improper body mechanics, Postural dysfunction, Increased muscle spasms, Decreased mobility, Decreased activity tolerance, Decreased endurance, Decreased strength, Impaired perceived functional ability, Obesity, Difficulty walking  Visit Diagnosis: Abnormal posture  Acute midline low back pain without sciatica  Muscle weakness (generalized)     Problem List Patient Active Problem List   Diagnosis Date Noted  . Acute pain of left shoulder 09/19/2017  . Cataracts, bilateral 07/18/2017  . Prediabetes 01/05/2017  . Physical deconditioning 01/03/2017  . Cough in adult 12/20/2016  . Acute bronchitis 12/13/2016  . Blood glucose elevated 12/13/2016  . Sepsis ([place]) 12/09/2016  . Goals of care, counseling/discussion 11/18/2016  . S/P autologous bone marrow transplantation ([place]) 08/12/2016  . Mild protein-calorie malnutrition ([place]) 08/12/2016  . Dysphagia 08/12/2016  . Essential hypertension 05/26/2016  . Hypoxia 05/24/2016  . Dehydration 05/23/2016  . Chest pain 05/18/2016  . GERD (gastroesophageal reflux disease) 05/18/2016  . Blepharoconjunctivitis of both eyes 04/21/2016  . Dyspnea 03/25/2016  . Left leg pain 03/24/2016  . Port catheter in place 02/25/2016  . Pancytopenia due to antineoplastic chemotherapy ([place]) 02/25/2016  . History of syphilis 02/24/2016  . Encounter for antineoplastic chemotherapy 02/09/2016  . Skin rash 12/14/2015  . 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 Anemia due to antineoplastic chemotherapy 09/22/2015  . Antineoplastic chemotherapy induced pancytopenia ([place])   . Bone pain   . Cancer associated pain 09/02/2015  . Multiple myeloma in relapse ([place])  09/01/2015  . 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 Vitamin D deficiency 07/22/2015  . Closed right clavicular fracture 07/17/2015  . Chest wall pain 03/12/2015  . Thoracolumbar back pain 03/12/2015  . Allergic rhinitis 03/12/2015  . Poor dentition 01/15/2015  . Nerve damage 01/15/2015  . Esophagitis 12/28/2014  . Lower GI bleed   . Hemorrhoids 11/06/2014  . HTN (hypertension) 11/06/2014  . Priapism 11/06/2014  . Decreased visual acuity 11/06/2014  . Family history of prostate cancer in father 11/06/2014    Otelia Limes, PTA 12/18/2017, 9:37 AM  [place], [place], [address] Phone: [phone]   Fax:  [phone]  Name: Erik Nguyen MRN: [id number] Date of Birth: 10-26-1955  PHYSICAL THERAPY DISCHARGE SUMMARY  Visits from Start of Care: 11  Current functional level related to goals / functional outcomes: Goals partially met as noted above.   Remaining deficits: Note above.   Education / Equipment: Home exercise program, self-care  Plan: Patient agrees to discharge.  Patient goals were partially met. Patient is being discharged due to the patient's request.  ?????   He has made good progress toward his goals. He hadn't felt well so missed a couple of appointments recently, then said (when called about this) that he was ready for discharge.  Serafina Royals, PT 12/27/17 12:50 PM

## 2017-12-20 ENCOUNTER — Ambulatory Visit: Payer: Medicaid Other

## 2017-12-25 ENCOUNTER — Ambulatory Visit: Payer: Medicaid Other

## 2017-12-27 ENCOUNTER — Ambulatory Visit: Payer: Medicaid Other

## 2017-12-27 ENCOUNTER — Telehealth: Payer: Self-pay

## 2017-12-27 NOTE — Telephone Encounter (Signed)
Pt missed second visit this week, which is very unlike him, so called and spoke with him. He reports hasn't felt well past few days but hasn't run a fever. Instructed him to call doctor if symptoms worsen and he verbalized understanding. He does report overall doing well from PTand feels ready for D/C so routed last note to PT for D/C.

## 2018-01-09 ENCOUNTER — Other Ambulatory Visit: Payer: Self-pay | Admitting: Hematology and Oncology

## 2018-01-09 ENCOUNTER — Encounter: Payer: Self-pay | Admitting: Family Medicine

## 2018-01-09 ENCOUNTER — Ambulatory Visit: Payer: Medicaid Other | Attending: Family Medicine | Admitting: Family Medicine

## 2018-01-09 VITALS — BP 133/87 | HR 71 | Temp 97.5°F | Ht 66.0 in | Wt 177.4 lb

## 2018-01-09 DIAGNOSIS — G620 Drug-induced polyneuropathy: Secondary | ICD-10-CM | POA: Diagnosis not present

## 2018-01-09 DIAGNOSIS — Z7982 Long term (current) use of aspirin: Secondary | ICD-10-CM | POA: Diagnosis not present

## 2018-01-09 DIAGNOSIS — Z7984 Long term (current) use of oral hypoglycemic drugs: Secondary | ICD-10-CM | POA: Diagnosis not present

## 2018-01-09 DIAGNOSIS — T451X5A Adverse effect of antineoplastic and immunosuppressive drugs, initial encounter: Secondary | ICD-10-CM | POA: Diagnosis not present

## 2018-01-09 DIAGNOSIS — Z9889 Other specified postprocedural states: Secondary | ICD-10-CM | POA: Insufficient documentation

## 2018-01-09 DIAGNOSIS — Z888 Allergy status to other drugs, medicaments and biological substances status: Secondary | ICD-10-CM | POA: Diagnosis not present

## 2018-01-09 DIAGNOSIS — Z79891 Long term (current) use of opiate analgesic: Secondary | ICD-10-CM | POA: Insufficient documentation

## 2018-01-09 DIAGNOSIS — I10 Essential (primary) hypertension: Secondary | ICD-10-CM

## 2018-01-09 DIAGNOSIS — R7303 Prediabetes: Secondary | ICD-10-CM | POA: Diagnosis not present

## 2018-01-09 DIAGNOSIS — Z79899 Other long term (current) drug therapy: Secondary | ICD-10-CM | POA: Diagnosis not present

## 2018-01-09 DIAGNOSIS — C9001 Multiple myeloma in remission: Secondary | ICD-10-CM | POA: Diagnosis not present

## 2018-01-09 DIAGNOSIS — R5383 Other fatigue: Secondary | ICD-10-CM | POA: Diagnosis not present

## 2018-01-09 DIAGNOSIS — C9002 Multiple myeloma in relapse: Secondary | ICD-10-CM

## 2018-01-09 DIAGNOSIS — K219 Gastro-esophageal reflux disease without esophagitis: Secondary | ICD-10-CM | POA: Diagnosis not present

## 2018-01-09 MED ORDER — HYDROCHLOROTHIAZIDE 25 MG PO TABS: 25.0000 mg | ORAL_TABLET | Freq: Every day | ORAL | 1 refills | Status: AC

## 2018-01-09 MED ORDER — RANITIDINE HCL 150 MG PO TABS: 150.0000 mg | ORAL_TABLET | Freq: Every day | ORAL | 1 refills | Status: AC

## 2018-01-09 MED ORDER — LISINOPRIL 20 MG PO TABS: 20.0000 mg | ORAL_TABLET | Freq: Every day | ORAL | 1 refills | Status: AC

## 2018-01-09 NOTE — Patient Instructions (Signed)

## 2018-01-09 NOTE — Progress Notes (Signed)
Subjective:  Patient ID: Erik Nguyen, male    DOB: Jul 01, 1956  Age: 62 y.o. MRN: [id number]  CC: Hypertension and Diabetes   HPI Erik Nguyen  is a  62 year old male with a history of prediabetes (A1c 6.1), hypertension, multiple myeloma (status post autologous stem cell transplant in 07/2016), chemotherapy-induced neuropathy, GERD who presents today for a follow-up visit. Today he complains of fatigue unrelated to activity and describes this further as a reduced energy level for the last 2 to 3 weeks.  His hemoglobin from 12/12/2017 was 14.4. Last seen by oncology, Dr. Alvy Bimler on 12/12/2017 and was thought to be in remission and he is on Zometa every 3 months and continues on acyclovir for antimicrobial prophylaxis. Denies back pain or bone pains. He tolerates his antihypertensive but informs me he takes lisinopril every day and hydrochlorothiazide only when his blood pressure is high.  Also doing well on metformin with no adverse effects..  He will be moving to [place] in the next 1 month and is requesting medication refills up until then. He denies acute concerns today.  Past Medical History:  Diagnosis Date  . Dyspnea    exertion   . Encounter for antineoplastic chemotherapy 02/09/2016  . GERD (gastroesophageal reflux disease)   . History of syphilis 02/24/2016  . Hypertension Dx 2016  . Multiple myeloma not having achieved remission ([place]) 09/01/2015  . Pre-diabetes   . Priapism 2014     Past Surgical History:  Procedure Laterality Date  . COLONOSCOPY    . POLYPECTOMY      Allergies  Allergen Reactions  . Benadryl [Diphenhydramine] Other (See Comments)    priapism  . Trazodone And Nefazodone     priapism  . Latex Itching and Rash    Outpatient Medications Prior to Visit  Medication Sig Dispense Refill  . acetaminophen (TYLENOL) 500 MG tablet Take 1,000 mg by mouth every 6 (six) hours as needed for fever.    Marland Kitchen acyclovir (ZOVIRAX) 400 MG tablet TAKE 1 TABLET BY MOUTH  TWICE DAILY 60 tablet 6  . aluminum-magnesium hydroxide-simethicone (MAALOX) 115-726-20 MG/5ML SUSP Take 30 mLs by mouth every 4 (four) hours as needed.    Marland Kitchen aspirin 81 MG chewable tablet Chew 81 mg by mouth daily.    Marland Kitchen CALCIUM PO Take 1 tablet by mouth daily.    . cholecalciferol (VITAMIN D) 1000 units tablet Take 1,000 Units by mouth daily.     . folic acid (FOLVITE) 1 MG tablet TAKE 1 TABLET BY MOUTH ONCE DAILY 90 tablet 1  . gabapentin (NEURONTIN) 300 MG capsule Take 1-2 capsules (300-600 mg total) by mouth 2 (two) times daily. Take 300 mg in the morning and 600 mg at night. 100 capsule 9  . lidocaine-prilocaine (EMLA) cream Apply to affected area once 30 g 3  . loperamide (IMODIUM) 2 MG capsule Take 2 mg by mouth 4 (four) times daily as needed.    . metFORMIN (GLUCOPHAGE) 500 MG tablet Take 500 mg by mouth daily.    . Multiple Vitamin (MULTIVITAMIN) tablet Take 1 tablet by mouth daily.  1  . ondansetron (ZOFRAN) 8 MG tablet Take 8 mg by mouth every 8 (eight) hours as needed.    . prochlorperazine (COMPAZINE) 10 MG tablet Take 10 mg by mouth every 6 (six) hours as needed.    Marland Kitchen tiZANidine (ZANAFLEX) 4 MG tablet Take 1 tablet (4 mg total) by mouth every 8 (eight) hours as needed for muscle spasms. 60 tablet 0  . traMADol (  ULTRAM) 50 MG tablet Take 1-2 tablets (50-100 mg total) by mouth every 6 (six) hours as needed. for pain 90 tablet 0  . hydrochlorothiazide (HYDRODIURIL) 25 MG tablet Take 25 mg by mouth daily.    Marland Kitchen lisinopril (PRINIVIL,ZESTRIL) 30 MG tablet Take 1 tablet (30 mg total) by mouth daily. 30 tablet 0  . ranitidine (ZANTAC) 150 MG tablet TAKE 1 TABLET BY MOUTH ONCE DAILY AT BEDTIME 30 tablet 1  . zolpidem (AMBIEN) 10 MG tablet Take 1 tablet (10 mg total) by mouth at bedtime as needed for sleep. 30 tablet 3  . pomalidomide (POMALYST) 2 MG capsule Take 1 capsule (2 mg total) by mouth daily. Take with water on days 1-21. Repeat every 28 days. (Patient not taking: Reported on 10/11/2017)  21 capsule 0   Facility-Administered Medications Prior to Visit  Medication Dose Route Frequency Provider Last Rate Last Dose  . heparin lock flush 100 unit/mL  500 Units Intracatheter Once PRN Alvy Bimler, Ni, MD      . sodium chloride flush (NS) 0.9 % injection 10 mL  10 mL Intracatheter PRN Alvy Bimler, Ni, MD   10 mL at 03/21/17 0924    ROS Review of Systems  Constitutional: Negative for activity change and appetite change.  HENT: Negative for sinus pressure and sore throat.   Eyes: Negative for visual disturbance.  Respiratory: Negative for cough, chest tightness and shortness of breath.   Cardiovascular: Negative for chest pain and leg swelling.  Gastrointestinal: Negative for abdominal distention, abdominal pain, constipation and diarrhea.  Endocrine: Negative.   Genitourinary: Negative for dysuria.  Musculoskeletal: Negative for joint swelling and myalgias.  Skin: Negative for rash.  Allergic/Immunologic: Negative.   Neurological: Negative for weakness, light-headedness and numbness.  Psychiatric/Behavioral: Negative for dysphoric mood and suicidal ideas.    Objective:  BP 133/87   Pulse 71   Temp (!) 97.5 F (36.4 C) (Oral)   Ht 5' 6"  (1.676 m)   Wt 177 lb 6.4 oz (80.5 kg)   SpO2 99%   BMI 28.63 kg/m   BP/Weight 01/09/2018 9/92/4268 10/16/1960  Systolic BP 229 798 921  Diastolic BP 87 92 85  Wt. (Lbs) 177.4 177.7 181.3  BMI 28.63 28.68 29.26     Physical Exam  Constitutional: He is oriented to person, place, and time. He appears well-developed and well-nourished.  Cardiovascular: Normal rate, normal heart sounds and intact distal pulses.  No murmur heard. Pulmonary/Chest: Effort normal and breath sounds normal. He has no wheezes. He has no rales. He exhibits no tenderness.  Abdominal: Soft. Bowel sounds are normal. He exhibits no distension and no mass. There is no tenderness.  Musculoskeletal: Normal range of motion.  Neurological: He is alert and oriented to person,  place, and time.  Skin: Skin is warm and dry.  Psychiatric: He has a normal mood and affect.     CMP Latest Ref Rng & Units 12/12/2017 11/14/2017 10/17/2017  Glucose 70 - 140 mg/dL 100 118 117  BUN 7 - 26 mg/dL 14 17 12   Creatinine 0.70 - 1.30 mg/dL 1.18 1.19 1.36(H)  Sodium 136 - 145 mmol/L 143 143 144  Potassium 3.5 - 5.1 mmol/L 3.8 3.7 3.7  Chloride 98 - 109 mmol/L 110(H) 111(H) 108  CO2 22 - 29 mmol/L 25 24 26   Calcium 8.4 - 10.4 mg/dL 9.6 9.2 9.6  Total Protein 6.4 - 8.3 g/dL 6.5 6.0(L) 6.4  Total Bilirubin 0.2 - 1.2 mg/dL 0.5 0.5 0.5  Alkaline Phos 40 - 150 U/L  44 42 47  AST 5 - 34 U/L 14 13 21   ALT 0 - 55 U/L 14 16 24     Lab Results  Component Value Date   HGBA1C 6.1 10/11/2017    Assessment & Plan:   1. Essential hypertension Controlled - lisinopril (PRINIVIL,ZESTRIL) 20 MG tablet; Take 1 tablet (20 mg total) by mouth daily.  Dispense: 30 tablet; Refill: 1 - hydrochlorothiazide (HYDRODIURIL) 25 MG tablet; Take 1 tablet (25 mg total) by mouth daily.  Dispense: 30 tablet; Refill: 1  2. Multiple myeloma in remission ([place]) Currently in remission as per oncology Continue antimicrobial therapy with acyclovir  Zometa every 3 months  3. Other fatigue - VITAMIN D 25 Hydroxy (Vit-D Deficiency, Fractures)  4. Peripheral neuropathy due to chemotherapy [facility]) Currently on gabapentin  5. Prediabetes A1c 6.1 Continue metformin   Meds ordered this encounter  Medications  . ranitidine (ZANTAC) 150 MG tablet    Sig: Take 1 tablet (150 mg total) by mouth at bedtime.    Dispense:  30 tablet    Refill:  1    Please consider 90 day supplies to promote better adherence  . lisinopril (PRINIVIL,ZESTRIL) 20 MG tablet    Sig: Take 1 tablet (20 mg total) by mouth daily.    Dispense:  30 tablet    Refill:  1  . hydrochlorothiazide (HYDRODIURIL) 25 MG tablet    Sig: Take 1 tablet (25 mg total) by mouth daily.    Dispense:  30 tablet    Refill:  1    Follow-up: Return for  Follow-up of chronic medical conditions, he will be establishing care with new PCP in [place].   Charlott Rakes MD

## 2018-01-10 LAB — VITAMIN D 25 HYDROXY (VIT D DEFICIENCY, FRACTURES): VIT D 25 HYDROXY: 56.9 ng/mL (ref 30.0–100.0)

## 2018-01-11 ENCOUNTER — Telehealth: Payer: Self-pay | Admitting: Hematology and Oncology

## 2018-01-11 ENCOUNTER — Inpatient Hospital Stay: Payer: Medicaid Other

## 2018-01-11 ENCOUNTER — Encounter: Payer: Self-pay | Admitting: Hematology and Oncology

## 2018-01-11 ENCOUNTER — Inpatient Hospital Stay (HOSPITAL_BASED_OUTPATIENT_CLINIC_OR_DEPARTMENT_OTHER): Payer: Medicaid Other | Admitting: Hematology and Oncology

## 2018-01-11 ENCOUNTER — Inpatient Hospital Stay: Payer: Medicaid Other | Attending: Hematology and Oncology

## 2018-01-11 ENCOUNTER — Telehealth: Payer: Self-pay

## 2018-01-11 VITALS — BP 155/101 | HR 91 | Temp 98.2°F | Resp 16

## 2018-01-11 DIAGNOSIS — I10 Essential (primary) hypertension: Secondary | ICD-10-CM | POA: Insufficient documentation

## 2018-01-11 DIAGNOSIS — C9002 Multiple myeloma in relapse: Secondary | ICD-10-CM

## 2018-01-11 DIAGNOSIS — Z5112 Encounter for antineoplastic immunotherapy: Secondary | ICD-10-CM | POA: Diagnosis present

## 2018-01-11 DIAGNOSIS — Z9481 Bone marrow transplant status: Secondary | ICD-10-CM | POA: Diagnosis not present

## 2018-01-11 DIAGNOSIS — Z7984 Long term (current) use of oral hypoglycemic drugs: Secondary | ICD-10-CM | POA: Insufficient documentation

## 2018-01-11 DIAGNOSIS — R7989 Other specified abnormal findings of blood chemistry: Secondary | ICD-10-CM

## 2018-01-11 DIAGNOSIS — Z7982 Long term (current) use of aspirin: Secondary | ICD-10-CM | POA: Diagnosis not present

## 2018-01-11 LAB — COMPREHENSIVE METABOLIC PANEL
ALT: 21 U/L (ref 0–55)
AST: 19 U/L (ref 5–34)
Albumin: 4 g/dL (ref 3.5–5.0)
Alkaline Phosphatase: 45 U/L (ref 40–150)
Anion gap: 10 (ref 3–11)
BILIRUBIN TOTAL: 0.4 mg/dL (ref 0.2–1.2)
BUN: 14 mg/dL (ref 7–26)
CALCIUM: 9.3 mg/dL (ref 8.4–10.4)
CO2: 24 mmol/L (ref 22–29)
CREATININE: 1.32 mg/dL — AB (ref 0.70–1.30)
Chloride: 110 mmol/L — ABNORMAL HIGH (ref 98–109)
GFR calc Af Amer: >60 mL/min (ref >60)
GFR, EST NON AFRICAN AMERICAN: 56 mL/min — AB (ref >60)
Glucose, Bld: 87 mg/dL (ref 70–140)
Potassium: 4.2 mmol/L (ref 3.5–5.1)
Sodium: 144 mmol/L (ref 136–145)
TOTAL PROTEIN: 6.4 g/dL (ref 6.4–8.3)

## 2018-01-11 LAB — CBC WITH DIFFERENTIAL/PLATELET
BASOS ABS: 0 10*3/uL (ref 0.0–0.1)
BASOS PCT: 1 %
EOS ABS: 0.2 10*3/uL (ref 0.0–0.5)
EOS PCT: 3 %
HCT: 39.4 % (ref 38.4–49.9)
Hemoglobin: 13.5 g/dL (ref 13.0–17.1)
Lymphocytes Relative: 49 %
Lymphs Abs: 2.8 10*3/uL (ref 0.9–3.3)
MCH: 32.4 pg (ref 27.2–33.4)
MCHC: 34.3 g/dL (ref 32.0–36.0)
MCV: 94.4 fL (ref 79.3–98.0)
Monocytes Absolute: 0.6 10*3/uL (ref 0.1–0.9)
Monocytes Relative: 10 %
Neutro Abs: 2.1 10*3/uL (ref 1.5–6.5)
Neutrophils Relative %: 37 %
Platelets: 178 10*3/uL (ref 140–400)
RBC: 4.17 MIL/uL — AB (ref 4.20–5.82)
RDW: 14.1 % (ref 11.0–14.6)
WBC: 5.6 10*3/uL (ref 4.0–10.3)

## 2018-01-11 MED ORDER — MONTELUKAST SODIUM 10 MG PO TABS
ORAL_TABLET | ORAL | Status: AC
  Filled 2018-01-11: qty 1

## 2018-01-11 MED ORDER — HEPARIN SOD (PORK) LOCK FLUSH 100 UNIT/ML IV SOLN
500.0000 [IU] | Freq: Once | INTRAVENOUS | Status: DC
  Filled 2018-01-11: qty 5

## 2018-01-11 MED ORDER — SODIUM CHLORIDE 0.9 % IV SOLN
Freq: Once | INTRAVENOUS | Status: AC
  Administered 2018-01-11: 11:00:00 via INTRAVENOUS

## 2018-01-11 MED ORDER — FAMOTIDINE IN NACL 20-0.9 MG/50ML-% IV SOLN
INTRAVENOUS | Status: AC
  Filled 2018-01-11: qty 50

## 2018-01-11 MED ORDER — SODIUM CHLORIDE 0.9% FLUSH
10.0000 mL | INTRAVENOUS | Status: DC | PRN
Start: 2018-01-11 — End: 2018-01-11
  Administered 2018-01-11: 10 mL
  Filled 2018-01-11: qty 10

## 2018-01-11 MED ORDER — ACETAMINOPHEN 325 MG PO TABS
ORAL_TABLET | ORAL | Status: AC
  Filled 2018-01-11: qty 2

## 2018-01-11 MED ORDER — ZOLEDRONIC ACID 4 MG/100ML IV SOLN
4.0000 mg | Freq: Once | INTRAVENOUS | Status: AC
  Administered 2018-01-11: 4 mg via INTRAVENOUS
  Filled 2018-01-11: qty 100

## 2018-01-11 MED ORDER — SODIUM CHLORIDE 0.9 % IV SOLN
15.6000 mg/kg | Freq: Once | INTRAVENOUS | Status: AC
  Administered 2018-01-11: 1200 mg via INTRAVENOUS
  Filled 2018-01-11: qty 60

## 2018-01-11 MED ORDER — DEXAMETHASONE SODIUM PHOSPHATE 10 MG/ML IJ SOLN
INTRAMUSCULAR | Status: AC
  Filled 2018-01-11: qty 1

## 2018-01-11 MED ORDER — HEPARIN SOD (PORK) LOCK FLUSH 100 UNIT/ML IV SOLN
500.0000 [IU] | Freq: Once | INTRAVENOUS | Status: AC | PRN
  Administered 2018-01-11: 500 [IU]
  Filled 2018-01-11: qty 5

## 2018-01-11 MED ORDER — PROCHLORPERAZINE MALEATE 10 MG PO TABS
ORAL_TABLET | ORAL | Status: AC
  Filled 2018-01-11: qty 1

## 2018-01-11 MED ORDER — DEXAMETHASONE SODIUM PHOSPHATE 10 MG/ML IJ SOLN
10.0000 mg | Freq: Once | INTRAMUSCULAR | Status: AC
  Administered 2018-01-11: 10 mg via INTRAVENOUS

## 2018-01-11 MED ORDER — FAMOTIDINE IN NACL 20-0.9 MG/50ML-% IV SOLN
20.0000 mg | Freq: Once | INTRAVENOUS | Status: AC
  Administered 2018-01-11: 20 mg via INTRAVENOUS

## 2018-01-11 MED ORDER — SODIUM CHLORIDE 0.9% FLUSH
10.0000 mL | Freq: Once | INTRAVENOUS | Status: AC
  Administered 2018-01-11: 10 mL
  Filled 2018-01-11: qty 10

## 2018-01-11 MED ORDER — MONTELUKAST SODIUM 10 MG PO TABS
10.0000 mg | ORAL_TABLET | Freq: Once | ORAL | Status: AC
Start: 2018-01-11 — End: 2018-01-11
  Administered 2018-01-11: 10 mg via ORAL

## 2018-01-11 MED ORDER — ACETAMINOPHEN 325 MG PO TABS
650.0000 mg | ORAL_TABLET | Freq: Once | ORAL | Status: AC
  Administered 2018-01-11: 650 mg via ORAL

## 2018-01-11 MED ORDER — PROCHLORPERAZINE MALEATE 10 MG PO TABS
10.0000 mg | ORAL_TABLET | Freq: Once | ORAL | Status: AC
  Administered 2018-01-11: 10 mg via ORAL

## 2018-01-11 NOTE — Progress Notes (Signed)
[place] OFFICE PROGRESS NOTE  Patient Care Team: Charlott Rakes, MD as PCP - General (Family Medicine) Melburn Hake, Costella Hatcher, MD as Consulting Physician (Hematology and Oncology)  ASSESSMENT & PLAN:  Multiple myeloma in relapse [facility]) Recent myeloma panel confirmed that the patient has achieved complete response to treatment Pomalyst has been discontinued 3 months ago and he is still in remission He will continue monthly daratumumab He has recently completed dexamethasone taper He is taking aspirin for DVT prophylaxis He is reminded to take calcium, vitamin D and will receive Zometa every 3 months He will continue antimicrobial treatment with acyclovir and aspirin for DVT prophylaxis He had no recent dental issues  Essential hypertension His blood pressure is elevated, could be exacerbated by pain and mild anxiety The patient had recent excessive weight gain We discussed dietary modification and the importance of weight loss and exercise  Elevated serum creatinine This could be related to dehydration We will monitor carefully  S/P autologous bone marrow transplantation [facility]) He has appointment to go back to see his transplant physician next month The patient is planning to relocate to [place] in the near future I will coordinate care and will transfer his records to his new oncologist in [place]   No orders of the defined types were placed in this encounter.   INTERVAL HISTORY: Please see below for problem oriented charting. He returns with his wife for further follow-up The patient is planning to relocate to [place] on February 09, 2018 He is doing well otherwise He has gained some weight recently His blood pressure is elevated but that could be attributed to anxiety He denies recent infection No new bone pain.  SUMMARY OF ONCOLOGIC HISTORY:   Multiple myeloma in relapse ([place])   08/28/2015 Bone Marrow Biopsy    Accession: [id number] biopsy showed 58%  myeloma involvement. Cytogenetics was normal and FISH showed Del 13q and t(11;14).       09/07/2015 - 01/22/2016 Chemotherapy    he received 6 cycles of treatment of Velcade and Revlimid with Dex       09/12/2015 - 09/16/2015 Hospital Admission    He was admitted to the hospital with sepsis, treatment was placed on hold and he received one dose of GCSF for leukopenia      01/12/2016 Adverse Reaction    Delay treatment due to neutropenia      01/27/2016 Imaging    ECHO at [facility] showed normal EF      01/27/2016 PET scan    PET scan at [facility] showed innumerable lytic lesions throughout      01/27/2016 Procedure    PFT is within normal limits at [facility]      01/28/2016 Bone Marrow Biopsy    Bone marrow at [facility] showed persistent plasma cell myeloma in a normocellularmarrow (30%) with 20% atypical plasma cells and myeloid hyperplasia.      02/11/2016 - 06/10/2016 Chemotherapy    He is started on salvage Rx with Kyprolis, Cytoxan and dexamethasone      03/24/2016 Imaging    US venous Doppler showed no evidence of deep vein or superficial thrombosis involving the right lower extremity and left lower extremity. No evidence of Baker&'s cyst on the right or left.      05/18/2016 - 05/19/2016 Hospital Admission    He was admitted for chest pain evaluation. Cardiac work-up excluded cardiac events and CT angiogram were negative for PE. It showed esphageal thickening c/w reflux esophagitis likely cause for  his chest discomfort      05/23/2016 - 05/25/2016 Hospital Admission    Patient was hospitalized for worsening shortness of breath and worsening pain. CT angiogram chest negative for pulmonary emboli. Patient had a recent normal 2-D echo and ruled out for MI. Patient noted to be dehydrated. Patient admitted placed on IV fluids. Diuretic discontinued. Patient's gabapentin has been increased per recommendations from prior office visit from patient's oncologist to 600 mg 3 times  daily      06/15/2016 Bone Marrow Biopsy    Bone marrow biopsy showed only 2% plasma cell, normal FISH and cytogenetics      07/26/2016 - 07/26/2016 Chemotherapy    He received high dose melphalan at [facility]       07/27/2016 Bone Marrow Transplant    He received autologous stem cell transplant at [facility]      08/05/2016 - 08/10/2016 Hospital Admission    He was admitted to [facility] for management of neutropenic fever and diarrhea. Cultures were negative. Stool culture came back positive for Novovirus and he was managed conservatively.      11/03/2016 Bone Marrow Biopsy    Bone Marrow Flow Cytometry - NO MONOCLONAL B CELL POPULATION IDENTIFIED. - PREDOMINANCE OF T CELLS WITH NON SPECIFIC CHANGES. - SEE NOTE. Diagnosis Comment: Analysis of the lymphoid population shows predominance of T lymphocytes expressing pan T-cell antigens but with relative abundance of CD8 positive cells and reversal of the CD4:CD8 ratio. No significant CD16/56 expression is identified. The T-cell changes are not considered specific in this setting. B cells represent the minor population with no monoclonality or abnormal phenotype. 4% lambda light chain restricted plasma cells. FISH positive CCND1/IGH 12% of cells.       11/09/2016 PET scan    No FDG avid myeloma is identified. Numerous osseous lytic lesions as above. 2.Hypermetabolic left level III lymph node, likely reactive etiology.  3.Ancillary CT findings as above.      11/29/2016 -  Chemotherapy    He received treatment with daratumumab and Pomalyst. Pomalyst was discontinued in March       12/13/2016 - 12/16/2016 Hospital Admission    He was admitted to the hospital for management of dehydration and early sepsis      01/23/2017 Procedure    Status post CT-guided bone marrow biopsy, with tissue specimen sent to pathology for complete histopathologic analysis      01/23/2017 Bone Marrow Biopsy    Bone Marrow, Aspirate,Biopsy, and Clot - SLIGHTLY  HYPOCELLULAR BONE MARROW FOR AGE WITH TRILINEAGE HEMATOPOIESIS AND 1% PLASMA CELLS. - SEE COMMENT. PERIPHERAL BLOOD: - NORMOCYTIC-NORMOCHROMIC ANEMIA. - LEUKOPENIA. Diagnosis Note The bone marrow is slightly hypocellular for age with trilineage hematopoiesis and non specific myeloid changes likely related to previous treatment. The plasma cells represent 1% of all cells in the aspirate with lack of large aggregate or sheets. To further evaluate the plasma cell component, immunohistochemical stains will be performed and the results reported in an addendum. Correlation with cytogenetic and FISH studies recommended.      01/31/2017 Adverse Reaction    Treatment was delayed due to SOB      01/31/2017 Imaging    1. No evidence of pulmonary embolism. Sensitivity is moderately degraded by respiratory motion. 2. Multiple myeloma.  Stable compression fracture at T9. 3. Coronary artery calcifications      10/17/2017 Imaging    No acute findings.  Mild spondylosis of the lumbar spinal mild multilevel disc disease.       REVIEW  OF SYSTEMS:   Constitutional: Denies fevers, chills or abnormal weight loss Eyes: Denies blurriness of vision Ears, nose, mouth, throat, and face: Denies mucositis or sore throat Respiratory: Denies cough, dyspnea or wheezes Cardiovascular: Denies palpitation, chest discomfort or lower extremity swelling Gastrointestinal:  Denies nausea, heartburn or change in bowel habits Skin: Denies abnormal skin rashes Lymphatics: Denies new lymphadenopathy or easy bruising Neurological:Denies numbness, tingling or new weaknesses Behavioral/Psych: Mood is stable, no new changes  All other systems were reviewed with the patient and are negative.  I have reviewed the past medical history, past surgical history, social history and family history with the patient and they are unchanged from previous note.  ALLERGIES:  is allergic to benadryl [diphenhydramine]; trazodone and  nefazodone; and latex.  MEDICATIONS:  Current Outpatient Medications  Medication Sig Dispense Refill  . acetaminophen (TYLENOL) 500 MG tablet Take 1,000 mg by mouth every 6 (six) hours as needed for fever.    Marland Kitchen acyclovir (ZOVIRAX) 400 MG tablet TAKE 1 TABLET BY MOUTH TWICE DAILY 60 tablet 6  . aluminum-magnesium hydroxide-simethicone (MAALOX) 767-341-93 MG/5ML SUSP Take 30 mLs by mouth every 4 (four) hours as needed.    Marland Kitchen aspirin 81 MG chewable tablet Chew 81 mg by mouth daily.    Marland Kitchen CALCIUM PO Take 1 tablet by mouth daily.    . cholecalciferol (VITAMIN D) 1000 units tablet Take 1,000 Units by mouth daily.     . folic acid (FOLVITE) 1 MG tablet TAKE 1 TABLET BY MOUTH ONCE DAILY 90 tablet 1  . gabapentin (NEURONTIN) 300 MG capsule Take 1-2 capsules (300-600 mg total) by mouth 2 (two) times daily. Take 300 mg in the morning and 600 mg at night. 100 capsule 9  . hydrochlorothiazide (HYDRODIURIL) 25 MG tablet Take 1 tablet (25 mg total) by mouth daily. 30 tablet 1  . lidocaine-prilocaine (EMLA) cream Apply to affected area once 30 g 3  . lisinopril (PRINIVIL,ZESTRIL) 20 MG tablet Take 1 tablet (20 mg total) by mouth daily. 30 tablet 1  . loperamide (IMODIUM) 2 MG capsule Take 2 mg by mouth 4 (four) times daily as needed.    . metFORMIN (GLUCOPHAGE) 500 MG tablet Take 500 mg by mouth daily.    . Multiple Vitamin (MULTIVITAMIN) tablet Take 1 tablet by mouth daily.  1  . ondansetron (ZOFRAN) 8 MG tablet Take 8 mg by mouth every 8 (eight) hours as needed.    . prochlorperazine (COMPAZINE) 10 MG tablet Take 10 mg by mouth every 6 (six) hours as needed.    . ranitidine (ZANTAC) 150 MG tablet Take 1 tablet (150 mg total) by mouth at bedtime. 30 tablet 1  . tiZANidine (ZANAFLEX) 4 MG tablet Take 1 tablet (4 mg total) by mouth every 8 (eight) hours as needed for muscle spasms. 60 tablet 0  . traMADol (ULTRAM) 50 MG tablet Take 1-2 tablets (50-100 mg total) by mouth every 6 (six) hours as needed. for pain 90  tablet 0  . zolpidem (AMBIEN) 10 MG tablet Take 1 tablet (10 mg total) by mouth at bedtime as needed for sleep. 30 tablet 3   No current facility-administered medications for this visit.    Facility-Administered Medications Ordered in Other Visits  Medication Dose Route Frequency Provider Last Rate Last Dose  . heparin lock flush 100 unit/mL  500 Units Intracatheter Once PRN Alvy Bimler, Delora Gravatt, MD      . sodium chloride flush (NS) 0.9 % injection 10 mL  10 mL Intracatheter PRN Siyah Mault,  MD   10 mL at 03/21/17 0924    PHYSICAL EXAMINATION: ECOG PERFORMANCE STATUS: 0 - Asymptomatic  Vitals:   01/11/18 0911  BP: (!) 146/100  Pulse: 72  Resp: 16  Temp: 97.8 F (36.6 C)  SpO2: 100%   Filed Weights   01/11/18 0911  Weight: 177 lb 3.2 oz (80.4 kg)    GENERAL:alert, no distress and comfortable SKIN: skin color, texture, turgor are normal, no rashes or significant lesions EYES: normal, Conjunctiva are pink and non-injected, sclera clear OROPHARYNX:no exudate, no erythema and lips, buccal mucosa, and tongue normal  NECK: supple, thyroid normal size, non-tender, without nodularity LYMPH:  no palpable lymphadenopathy in the cervical, axillary or inguinal LUNGS: clear to auscultation and percussion with normal breathing effort HEART: regular rate & rhythm and no murmurs and no lower extremity edema ABDOMEN:abdomen soft, non-tender and normal bowel sounds Musculoskeletal:no cyanosis of digits and no clubbing  NEURO: alert & oriented x 3 with fluent speech, no focal motor/sensory deficits  LABORATORY DATA:  I have reviewed the data as listed    Component Value Date/Time   NA 144 01/11/2018 0850   NA 140 07/18/2017 0909   K 4.2 01/11/2018 0850   K 3.6 07/18/2017 0909   CL 110 (H) 01/11/2018 0850   CO2 24 01/11/2018 0850   CO2 24 07/18/2017 0909   GLUCOSE 87 01/11/2018 0850   GLUCOSE 109 07/18/2017 0909   BUN 14 01/11/2018 0850   BUN 12.1 07/18/2017 0909   CREATININE 1.32 (H)  01/11/2018 0850   CREATININE 1.3 07/18/2017 0909   CALCIUM 9.3 01/11/2018 0850   CALCIUM 9.3 07/18/2017 0909   PROT 6.4 01/11/2018 0850   PROT 6.4 07/18/2017 0909   PROT 6.7 07/18/2017 0909   ALBUMIN 4.0 01/11/2018 0850   ALBUMIN 3.9 07/18/2017 0909   AST 19 01/11/2018 0850   AST 13 07/18/2017 0909   ALT 21 01/11/2018 0850   ALT 15 07/18/2017 0909   ALKPHOS 45 01/11/2018 0850   ALKPHOS 49 07/18/2017 0909   BILITOT 0.4 01/11/2018 0850   BILITOT 0.50 07/18/2017 0909   GFRNONAA 56 (L) 01/11/2018 0850   GFRNONAA 71 07/24/2015 1155   GFRAA >60 01/11/2018 0850   GFRAA 82 07/24/2015 1155    No results found for: SPEP, UPEP  Lab Results  Component Value Date   WBC 5.6 01/11/2018   NEUTROABS 2.1 01/11/2018   HGB 13.5 01/11/2018   HCT 39.4 01/11/2018   MCV 94.4 01/11/2018   PLT 178 01/11/2018      Chemistry      Component Value Date/Time   NA 144 01/11/2018 0850   NA 140 07/18/2017 0909   K 4.2 01/11/2018 0850   K 3.6 07/18/2017 0909   CL 110 (H) 01/11/2018 0850   CO2 24 01/11/2018 0850   CO2 24 07/18/2017 0909   BUN 14 01/11/2018 0850   BUN 12.1 07/18/2017 0909   CREATININE 1.32 (H) 01/11/2018 0850   CREATININE 1.3 07/18/2017 0909      Component Value Date/Time   CALCIUM 9.3 01/11/2018 0850   CALCIUM 9.3 07/18/2017 0909   ALKPHOS 45 01/11/2018 0850   ALKPHOS 49 07/18/2017 0909   AST 19 01/11/2018 0850   AST 13 07/18/2017 0909   ALT 21 01/11/2018 0850   ALT 15 07/18/2017 0909   BILITOT 0.4 01/11/2018 0850   BILITOT 0.50 07/18/2017 0909     All questions were answered. The patient knows to call the clinic with any problems, questions or  concerns. No barriers to learning was detected.  I spent 15 minutes counseling the patient face to face. The total time spent in the appointment was 20 minutes and more than 50% was on counseling and review of test results  Heath Lark, MD 01/11/2018 10:14 AM

## 2018-01-11 NOTE — Assessment & Plan Note (Signed)
His blood pressure is elevated, could be exacerbated by pain and mild anxiety The patient had recent excessive weight gain We discussed dietary modification and the importance of weight loss and exercise

## 2018-01-11 NOTE — Assessment & Plan Note (Signed)
He has appointment to go back to see his transplant physician next month The patient is planning to relocate to [place] in the near future I will coordinate care and will transfer his records to his new oncologist in [place]

## 2018-01-11 NOTE — Assessment & Plan Note (Signed)
Recent myeloma panel confirmed that the patient has achieved complete response to treatment Pomalyst has been discontinued 3 months ago and he is still in remission He will continue monthly daratumumab He has recently completed dexamethasone taper He is taking aspirin for DVT prophylaxis He is reminded to take calcium, vitamin D and will receive Zometa every 3 months He will continue antimicrobial treatment with acyclovir and aspirin for DVT prophylaxis He had no recent dental issues

## 2018-01-11 NOTE — Telephone Encounter (Signed)
Gave patient AVs and calendar of upcoming June appointments. °

## 2018-01-11 NOTE — Progress Notes (Signed)
Dr Alvy Bimler aware of elevated BP.  PT instructed to f/u with PCP for antihypertensive med  Adjustments.

## 2018-01-11 NOTE — Patient Instructions (Signed)
[facility] Discharge Instructions for Patients Receiving Chemotherapy  Today you received the following chemotherapy agents: Daratumumab (Darzalex)   To help prevent nausea and vomiting after your treatment, we encourage you to take your nausea medication  as prescribed.    If you develop nausea and vomiting that is not controlled by your nausea medication, call the clinic.   BELOW ARE SYMPTOMS THAT SHOULD BE REPORTED IMMEDIATELY:  *FEVER GREATER THAN 100.5 F  *CHILLS WITH OR WITHOUT FEVER  NAUSEA AND VOMITING THAT IS NOT CONTROLLED WITH YOUR NAUSEA MEDICATION  *UNUSUAL SHORTNESS OF BREATH  *UNUSUAL BRUISING OR BLEEDING  TENDERNESS IN MOUTH AND THROAT WITH OR WITHOUT PRESENCE OF ULCERS  *URINARY PROBLEMS  *BOWEL PROBLEMS  UNUSUAL RASH Items with * indicate a potential emergency and should be followed up as soon as possible.  Feel free to call the clinic should you have any questions or concerns. The clinic phone number is [phone].  Please show the CHEMO ALERT CARD at check-in to the Emergency Department and triage nurse.   

## 2018-01-11 NOTE — Assessment & Plan Note (Signed)
This could be related to dehydration We will monitor carefully

## 2018-01-11 NOTE — Telephone Encounter (Signed)
Per Dr. Alvy Bimler, faxed today's office note to [facility] at fax # [phone]. Patient is moving to [place]. [place] Chief Financial Officer. Institute: [facility] Dr. Michael Boston. Armed forces operational officer Telephone # [phone]

## 2018-01-12 ENCOUNTER — Telehealth: Payer: Self-pay

## 2018-01-12 LAB — KAPPA/LAMBDA LIGHT CHAINS
Kappa free light chain: 5.7 mg/L (ref 3.3–19.4)
Kappa, lambda light chain ratio: 2.19 — ABNORMAL HIGH (ref 0.26–1.65)
LAMDA FREE LIGHT CHAINS: 2.6 mg/L — AB (ref 5.7–26.3)

## 2018-01-12 NOTE — Telephone Encounter (Signed)
Patient was called and informed of lab results. 

## 2018-01-16 LAB — MULTIPLE MYELOMA PANEL, SERUM
ALBUMIN/GLOB SERPL: 1.9 — AB (ref 0.7–1.7)
ALPHA 1: 0.2 g/dL (ref 0.0–0.4)
ALPHA2 GLOB SERPL ELPH-MCNC: 0.8 g/dL (ref 0.4–1.0)
Albumin SerPl Elph-Mcnc: 3.9 g/dL (ref 2.9–4.4)
B-GLOBULIN SERPL ELPH-MCNC: 0.8 g/dL (ref 0.7–1.3)
GLOBULIN, TOTAL: 2.1 g/dL — AB (ref 2.2–3.9)
Gamma Glob SerPl Elph-Mcnc: 0.4 g/dL (ref 0.4–1.8)
IGG (IMMUNOGLOBIN G), SERUM: 500 mg/dL — AB (ref 700–1600)
IgA: 35 mg/dL — ABNORMAL LOW (ref 61–437)
IgM (Immunoglobulin M), Srm: 27 mg/dL (ref 20–172)
Total Protein ELP: 6 g/dL (ref 6.0–8.5)

## 2018-02-02 ENCOUNTER — Other Ambulatory Visit: Payer: Self-pay | Admitting: Hematology and Oncology

## 2018-02-08 ENCOUNTER — Inpatient Hospital Stay: Payer: Medicaid Other

## 2018-02-08 ENCOUNTER — Encounter: Payer: Self-pay | Admitting: Hematology and Oncology

## 2018-02-08 ENCOUNTER — Inpatient Hospital Stay: Payer: Medicaid Other | Attending: Hematology and Oncology

## 2018-02-08 ENCOUNTER — Telehealth: Payer: Self-pay

## 2018-02-08 ENCOUNTER — Inpatient Hospital Stay (HOSPITAL_BASED_OUTPATIENT_CLINIC_OR_DEPARTMENT_OTHER): Payer: Medicaid Other | Admitting: Hematology and Oncology

## 2018-02-08 VITALS — BP 151/100 | HR 89 | Temp 97.8°F | Resp 18

## 2018-02-08 DIAGNOSIS — C9002 Multiple myeloma in relapse: Secondary | ICD-10-CM

## 2018-02-08 DIAGNOSIS — Z5112 Encounter for antineoplastic immunotherapy: Secondary | ICD-10-CM | POA: Diagnosis present

## 2018-02-08 DIAGNOSIS — Z7189 Other specified counseling: Secondary | ICD-10-CM | POA: Diagnosis not present

## 2018-02-08 DIAGNOSIS — Z7982 Long term (current) use of aspirin: Secondary | ICD-10-CM | POA: Diagnosis not present

## 2018-02-08 DIAGNOSIS — Z9481 Bone marrow transplant status: Secondary | ICD-10-CM

## 2018-02-08 LAB — CBC WITH DIFFERENTIAL/PLATELET
BASOS ABS: 0 10*3/uL (ref 0.0–0.1)
Basophils Relative: 1 %
EOS ABS: 0.2 10*3/uL (ref 0.0–0.5)
Eosinophils Relative: 4 %
HCT: 37.5 % — ABNORMAL LOW (ref 38.4–49.9)
Hemoglobin: 13.2 g/dL (ref 13.0–17.1)
LYMPHS PCT: 43 %
Lymphs Abs: 2.2 10*3/uL (ref 0.9–3.3)
MCH: 32.5 pg (ref 27.2–33.4)
MCHC: 35.1 g/dL (ref 32.0–36.0)
MCV: 92.8 fL (ref 79.3–98.0)
MONO ABS: 0.5 10*3/uL (ref 0.1–0.9)
Monocytes Relative: 9 %
Neutro Abs: 2.2 10*3/uL (ref 1.5–6.5)
Neutrophils Relative %: 43 %
PLATELETS: 193 10*3/uL (ref 140–400)
RBC: 4.04 MIL/uL — AB (ref 4.20–5.82)
RDW: 14 % (ref 11.0–14.6)
WBC: 5.1 10*3/uL (ref 4.0–10.3)

## 2018-02-08 LAB — COMPREHENSIVE METABOLIC PANEL
ALT: 15 U/L (ref 0–44)
ANION GAP: 7 (ref 5–15)
AST: 12 U/L — AB (ref 15–41)
Albumin: 4 g/dL (ref 3.5–5.0)
Alkaline Phosphatase: 44 U/L (ref 38–126)
BUN: 16 mg/dL (ref 8–23)
CHLORIDE: 109 mmol/L (ref 98–111)
CO2: 27 mmol/L (ref 22–32)
CREATININE: 1.22 mg/dL (ref 0.61–1.24)
Calcium: 9.5 mg/dL (ref 8.9–10.3)
GFR calc non Af Amer: >60 mL/min (ref >60)
Glucose, Bld: 117 mg/dL — ABNORMAL HIGH (ref 70–99)
Potassium: 3.6 mmol/L (ref 3.5–5.1)
SODIUM: 143 mmol/L (ref 135–145)
Total Bilirubin: 0.5 mg/dL (ref 0.3–1.2)
Total Protein: 6.2 g/dL — ABNORMAL LOW (ref 6.5–8.1)

## 2018-02-08 MED ORDER — SODIUM CHLORIDE 0.9% FLUSH
10.0000 mL | INTRAVENOUS | Status: DC | PRN
  Administered 2018-02-08: 10 mL
  Filled 2018-02-08: qty 10

## 2018-02-08 MED ORDER — FAMOTIDINE IN NACL 20-0.9 MG/50ML-% IV SOLN
20.0000 mg | Freq: Once | INTRAVENOUS | Status: AC
  Administered 2018-02-08: 20 mg via INTRAVENOUS

## 2018-02-08 MED ORDER — FAMOTIDINE IN NACL 20-0.9 MG/50ML-% IV SOLN
INTRAVENOUS | Status: AC
  Filled 2018-02-08: qty 50

## 2018-02-08 MED ORDER — ACETAMINOPHEN 325 MG PO TABS
ORAL_TABLET | ORAL | Status: AC
  Filled 2018-02-08: qty 2

## 2018-02-08 MED ORDER — SODIUM CHLORIDE 0.9% FLUSH
10.0000 mL | Freq: Once | INTRAVENOUS | Status: AC
  Administered 2018-02-08: 10 mL
  Filled 2018-02-08: qty 10

## 2018-02-08 MED ORDER — DEXAMETHASONE SODIUM PHOSPHATE 10 MG/ML IJ SOLN
10.0000 mg | Freq: Once | INTRAMUSCULAR | Status: AC
  Administered 2018-02-08: 10 mg via INTRAVENOUS

## 2018-02-08 MED ORDER — DEXAMETHASONE SODIUM PHOSPHATE 10 MG/ML IJ SOLN
INTRAMUSCULAR | Status: AC
  Filled 2018-02-08: qty 1

## 2018-02-08 MED ORDER — MONTELUKAST SODIUM 10 MG PO TABS
ORAL_TABLET | ORAL | Status: AC
  Filled 2018-02-08: qty 1

## 2018-02-08 MED ORDER — PROCHLORPERAZINE MALEATE 10 MG PO TABS
10.0000 mg | ORAL_TABLET | Freq: Once | ORAL | Status: AC
  Administered 2018-02-08: 10 mg via ORAL

## 2018-02-08 MED ORDER — SODIUM CHLORIDE 0.9 % IV SOLN
Freq: Once | INTRAVENOUS | Status: AC
  Administered 2018-02-08: 10:00:00 via INTRAVENOUS

## 2018-02-08 MED ORDER — HEPARIN SOD (PORK) LOCK FLUSH 100 UNIT/ML IV SOLN
500.0000 [IU] | Freq: Once | INTRAVENOUS | Status: AC | PRN
  Administered 2018-02-08: 500 [IU]
  Filled 2018-02-08: qty 5

## 2018-02-08 MED ORDER — SODIUM CHLORIDE 0.9 % IV SOLN
1200.0000 mg | Freq: Once | INTRAVENOUS | Status: AC
  Administered 2018-02-08: 1200 mg via INTRAVENOUS
  Filled 2018-02-08: qty 60

## 2018-02-08 MED ORDER — ACETAMINOPHEN 325 MG PO TABS
650.0000 mg | ORAL_TABLET | Freq: Once | ORAL | Status: AC
  Administered 2018-02-08: 650 mg via ORAL

## 2018-02-08 MED ORDER — MONTELUKAST SODIUM 10 MG PO TABS
10.0000 mg | ORAL_TABLET | Freq: Once | ORAL | Status: AC
  Administered 2018-02-08: 10 mg via ORAL

## 2018-02-08 MED ORDER — PROCHLORPERAZINE MALEATE 10 MG PO TABS
ORAL_TABLET | ORAL | Status: AC
  Filled 2018-02-08: qty 1

## 2018-02-08 NOTE — Telephone Encounter (Signed)
Faxed office note to [phone].

## 2018-02-08 NOTE — Patient Instructions (Signed)
[facility] Discharge Instructions for Patients Receiving Chemotherapy  Today you received the following chemotherapy agents: Darzalex  To help prevent nausea and vomiting after your treatment, we encourage you to take your nausea medication as directed.    If you develop nausea and vomiting that is not controlled by your nausea medication, call the clinic.   BELOW ARE SYMPTOMS THAT SHOULD BE REPORTED IMMEDIATELY:  *FEVER GREATER THAN 100.5 F  *CHILLS WITH OR WITHOUT FEVER  NAUSEA AND VOMITING THAT IS NOT CONTROLLED WITH YOUR NAUSEA MEDICATION  *UNUSUAL SHORTNESS OF BREATH  *UNUSUAL BRUISING OR BLEEDING  TENDERNESS IN MOUTH AND THROAT WITH OR WITHOUT PRESENCE OF ULCERS  *URINARY PROBLEMS  *BOWEL PROBLEMS  UNUSUAL RASH Items with * indicate a potential emergency and should be followed up as soon as possible.  Feel free to call the clinic should you have any questions or concerns. The clinic phone number is [phone].  Please show the CHEMO ALERT CARD at check-in to the Emergency Department and triage nurse.   

## 2018-02-08 NOTE — Assessment & Plan Note (Addendum)
Recent myeloma panel confirmed that the patient has achieved complete response to treatment Pomalyst has been discontinued 3 months ago and he is still in remission He will continue monthly daratumumab He has recently completed dexamethasone taper He is taking aspirin for DVT prophylaxis He is reminded to take calcium, vitamin D and will receive Zometa every 3 months He will continue antimicrobial treatment with acyclovir and aspirin for DVT prophylaxis

## 2018-02-08 NOTE — Telephone Encounter (Signed)
-----   Message from Heath Lark, MD sent at 02/08/2018  8:48 AM EDT ----- Please fax today's office note to [facility] at fax # [phone]. Patient is moving to [place]. [place] Chief Financial Officer. Institute: [facility] Dr. Michael Boston. Armed forces operational officer Telephone # [phone]

## 2018-02-08 NOTE — Progress Notes (Addendum)
[facility] OFFICE PROGRESS NOTE  Patient Care Team: Charlott Rakes, MD as PCP - General (Family Medicine) Melburn Hake, Costella Hatcher, MD as Consulting Physician (Hematology and Oncology)  ASSESSMENT & PLAN:  Multiple myeloma in relapse [facility]) Recent myeloma panel confirmed that the patient has achieved complete response to treatment Pomalyst has been discontinued 3 months ago and he is still in remission He will continue monthly daratumumab He has recently completed dexamethasone taper He is taking aspirin for DVT prophylaxis He is reminded to take calcium, vitamin D and will receive Zometa every 3 months He will continue antimicrobial treatment with acyclovir and aspirin for DVT prophylaxis  S/P autologous bone marrow transplantation [facility]) The patient is planning to relocate to [place] in the near future I will coordinate care and will transfer his records to his new oncologist in [place] of care, counseling/discussion I have extensive discussion with his wife regarding goals of care and future plan of care We discussed logistics of transferring his care to another facility We also discussed the plan to remain on monthly daratumumab for minimum of 2 years, potentially indefinitely if he tolerates treatment well   No orders of the defined types were placed in this encounter.   INTERVAL HISTORY: Please see below for problem oriented charting. He returns for final treatment today He will be leaving in 2 days time to relocate to [place] He has received recent vaccination from his transplant physician He feels well No recent infection No new bone pain He denies recent dental issues  SUMMARY OF ONCOLOGIC HISTORY:   Multiple myeloma in relapse ([place])   08/28/2015 Bone Marrow Biopsy    Accession: [id number] biopsy showed 58% myeloma involvement. Cytogenetics was normal and FISH showed Del 13q and t(11;14).       09/07/2015 - 01/22/2016 Chemotherapy    he received  6 cycles of treatment of Velcade and Revlimid with Dex       09/12/2015 - 09/16/2015 Hospital Admission    He was admitted to the hospital with sepsis, treatment was placed on hold and he received one dose of GCSF for leukopenia      01/12/2016 Adverse Reaction    Delay treatment due to neutropenia      01/27/2016 Imaging    ECHO at [facility] showed normal EF      01/27/2016 PET scan    PET scan at [facility] showed innumerable lytic lesions throughout      01/27/2016 Procedure    PFT is within normal limits at [facility]      01/28/2016 Bone Marrow Biopsy    Bone marrow at [facility] showed persistent plasma cell myeloma in a normocellularmarrow (30%) with 20% atypical plasma cells and myeloid hyperplasia.      02/11/2016 - 06/10/2016 Chemotherapy    He is started on salvage Rx with Kyprolis, Cytoxan and dexamethasone      03/24/2016 Imaging    US venous Doppler showed no evidence of deep vein or superficial thrombosis involving the right lower extremity and left lower extremity. No evidence of Baker&'s cyst on the right or left.      05/18/2016 - 05/19/2016 Hospital Admission    He was admitted for chest pain evaluation. Cardiac work-up excluded cardiac events and CT angiogram were negative for PE. It showed esphageal thickening c/w reflux esophagitis likely cause for his chest discomfort      05/23/2016 - 05/25/2016 Hospital Admission    Patient was hospitalized for worsening shortness of breath  and worsening pain. CT angiogram chest negative for pulmonary emboli. Patient had a recent normal 2-D echo and ruled out for MI. Patient noted to be dehydrated. Patient admitted placed on IV fluids. Diuretic discontinued. Patient's gabapentin has been increased per recommendations from prior office visit from patient's oncologist to 600 mg 3 times daily      06/15/2016 Bone Marrow Biopsy    Bone marrow biopsy showed only 2% plasma cell, normal FISH and cytogenetics      07/26/2016 -  07/26/2016 Chemotherapy    He received high dose melphalan at [facility]       07/27/2016 Bone Marrow Transplant    He received autologous stem cell transplant at [facility]      08/05/2016 - 08/10/2016 Hospital Admission    He was admitted to [facility] for management of neutropenic fever and diarrhea. Cultures were negative. Stool culture came back positive for Novovirus and he was managed conservatively.      11/03/2016 Bone Marrow Biopsy    Bone Marrow Flow Cytometry - NO MONOCLONAL B CELL POPULATION IDENTIFIED. - PREDOMINANCE OF T CELLS WITH NON SPECIFIC CHANGES. - SEE NOTE. Diagnosis Comment: Analysis of the lymphoid population shows predominance of T lymphocytes expressing pan T-cell antigens but with relative abundance of CD8 positive cells and reversal of the CD4:CD8 ratio. No significant CD16/56 expression is identified. The T-cell changes are not considered specific in this setting. B cells represent the minor population with no monoclonality or abnormal phenotype. 4% lambda light chain restricted plasma cells. FISH positive CCND1/IGH 12% of cells.       11/09/2016 PET scan    No FDG avid myeloma is identified. Numerous osseous lytic lesions as above. 2.Hypermetabolic left level III lymph node, likely reactive etiology.  3.Ancillary CT findings as above.      11/29/2016 -  Chemotherapy    He received treatment with daratumumab and Pomalyst. Pomalyst was discontinued in March       12/13/2016 - 12/16/2016 Hospital Admission    He was admitted to the hospital for management of dehydration and early sepsis      01/23/2017 Procedure    Status post CT-guided bone marrow biopsy, with tissue specimen sent to pathology for complete histopathologic analysis      01/23/2017 Bone Marrow Biopsy    Bone Marrow, Aspirate,Biopsy, and Clot - SLIGHTLY HYPOCELLULAR BONE MARROW FOR AGE WITH TRILINEAGE HEMATOPOIESIS AND 1% PLASMA CELLS. - SEE COMMENT. PERIPHERAL BLOOD: -  NORMOCYTIC-NORMOCHROMIC ANEMIA. - LEUKOPENIA. Diagnosis Note The bone marrow is slightly hypocellular for age with trilineage hematopoiesis and non specific myeloid changes likely related to previous treatment. The plasma cells represent 1% of all cells in the aspirate with lack of large aggregate or sheets. To further evaluate the plasma cell component, immunohistochemical stains will be performed and the results reported in an addendum. Correlation with cytogenetic and FISH studies recommended.      01/31/2017 Adverse Reaction    Treatment was delayed due to SOB      01/31/2017 Imaging    1. No evidence of pulmonary embolism. Sensitivity is moderately degraded by respiratory motion. 2. Multiple myeloma.  Stable compression fracture at T9. 3. Coronary artery calcifications      10/17/2017 Imaging    No acute findings.  Mild spondylosis of the lumbar spinal mild multilevel disc disease.       REVIEW OF SYSTEMS:   Constitutional: Denies fevers, chills or abnormal weight loss Eyes: Denies blurriness of vision Ears, nose, mouth, throat, and face: Denies  mucositis or sore throat Respiratory: Denies cough, dyspnea or wheezes Cardiovascular: Denies palpitation, chest discomfort or lower extremity swelling Gastrointestinal:  Denies nausea, heartburn or change in bowel habits Skin: Denies abnormal skin rashes Lymphatics: Denies new lymphadenopathy or easy bruising Neurological:Denies numbness, tingling or new weaknesses Behavioral/Psych: Mood is stable, no new changes  All other systems were reviewed with the patient and are negative.  I have reviewed the past medical history, past surgical history, social history and family history with the patient and they are unchanged from previous note.  ALLERGIES:  is allergic to benadryl [diphenhydramine]; trazodone and nefazodone; and latex.  MEDICATIONS:  Current Outpatient Medications  Medication Sig Dispense Refill  . acetaminophen  (TYLENOL) 500 MG tablet Take 1,000 mg by mouth every 6 (six) hours as needed for fever.    Marland Kitchen acyclovir (ZOVIRAX) 400 MG tablet TAKE 1 TABLET BY MOUTH TWICE DAILY 60 tablet 6  . aluminum-magnesium hydroxide-simethicone (MAALOX) 237-628-31 MG/5ML SUSP Take 30 mLs by mouth every 4 (four) hours as needed.    Marland Kitchen aspirin 81 MG chewable tablet Chew 81 mg by mouth daily.    Marland Kitchen CALCIUM PO Take 1 tablet by mouth daily.    . cholecalciferol (VITAMIN D) 1000 units tablet Take 1,000 Units by mouth daily.     . folic acid (FOLVITE) 1 MG tablet TAKE 1 TABLET BY MOUTH ONCE DAILY 90 tablet 1  . gabapentin (NEURONTIN) 300 MG capsule Take 1-2 capsules (300-600 mg total) by mouth 2 (two) times daily. Take 300 mg in the morning and 600 mg at night. 100 capsule 9  . hydrochlorothiazide (HYDRODIURIL) 25 MG tablet Take 1 tablet (25 mg total) by mouth daily. 30 tablet 1  . lidocaine-prilocaine (EMLA) cream Apply to affected area once 30 g 3  . lisinopril (PRINIVIL,ZESTRIL) 20 MG tablet Take 1 tablet (20 mg total) by mouth daily. 30 tablet 1  . loperamide (IMODIUM) 2 MG capsule Take 2 mg by mouth 4 (four) times daily as needed.    . metFORMIN (GLUCOPHAGE) 500 MG tablet Take 500 mg by mouth daily.    . Multiple Vitamin (MULTIVITAMIN) tablet Take 1 tablet by mouth daily.  1  . ondansetron (ZOFRAN) 8 MG tablet Take 8 mg by mouth every 8 (eight) hours as needed.    . prochlorperazine (COMPAZINE) 10 MG tablet Take 10 mg by mouth every 6 (six) hours as needed.    . ranitidine (ZANTAC) 150 MG tablet Take 1 tablet (150 mg total) by mouth at bedtime. 30 tablet 1  . tiZANidine (ZANAFLEX) 4 MG tablet Take 1 tablet (4 mg total) by mouth every 8 (eight) hours as needed for muscle spasms. 60 tablet 0  . traMADol (ULTRAM) 50 MG tablet Take 1-2 tablets (50-100 mg total) by mouth every 6 (six) hours as needed. for pain 90 tablet 0  . zolpidem (AMBIEN) 10 MG tablet Take 1 tablet (10 mg total) by mouth at bedtime as needed for sleep. 30 tablet  3   No current facility-administered medications for this visit.    Facility-Administered Medications Ordered in Other Visits  Medication Dose Route Frequency Provider Last Rate Last Dose  . daratumumab (DARZALEX) 1,200 mg in sodium chloride 0.9 % 440 mL chemo infusion  1,200 mg Intravenous Once Alvy Bimler, Karina Lenderman, MD      . famotidine (PEPCID) IVPB 20 mg premix  20 mg Intravenous Once Alvy Bimler, Meshach Perry, MD 200 mL/hr at 02/08/18 0942 20 mg at 02/08/18 0942  . heparin lock flush 100 unit/mL  500  Units Intracatheter Once PRN Alvy Bimler, Porsha Skilton, MD      . heparin lock flush 100 unit/mL  500 Units Intracatheter Once PRN Alvy Bimler, Ruey Storer, MD      . sodium chloride flush (NS) 0.9 % injection 10 mL  10 mL Intracatheter PRN Alvy Bimler, Malaina Mortellaro, MD   10 mL at 03/21/17 0924  . sodium chloride flush (NS) 0.9 % injection 10 mL  10 mL Intracatheter PRN Alvy Bimler, Mulan Adan, MD        PHYSICAL EXAMINATION: ECOG PERFORMANCE STATUS: 0 - Asymptomatic  Vitals:   02/08/18 0825  BP: (!) 146/91  Pulse: 91  Resp: 18  Temp: 98.4 F (36.9 C)  SpO2: 99%   Filed Weights   02/08/18 0825  Weight: 176 lb 12.8 oz (80.2 kg)    GENERAL:alert, no distress and comfortable SKIN: skin color, texture, turgor are normal, no rashes or significant lesions EYES: normal, Conjunctiva are pink and non-injected, sclera clear OROPHARYNX:no exudate, no erythema and lips, buccal mucosa, and tongue normal  NECK: supple, thyroid normal size, non-tender, without nodularity LYMPH:  no palpable lymphadenopathy in the cervical, axillary or inguinal LUNGS: clear to auscultation and percussion with normal breathing effort HEART: regular rate & rhythm and no murmurs and no lower extremity edema ABDOMEN:abdomen soft, non-tender and normal bowel sounds Musculoskeletal:no cyanosis of digits and no clubbing  NEURO: alert & oriented x 3 with fluent speech, no focal motor/sensory deficits  LABORATORY DATA:  I have reviewed the data as listed    Component Value Date/Time    NA 143 02/08/2018 0758   NA 140 07/18/2017 0909   K 3.6 02/08/2018 0758   K 3.6 07/18/2017 0909   CL 109 02/08/2018 0758   CO2 27 02/08/2018 0758   CO2 24 07/18/2017 0909   GLUCOSE 117 (H) 02/08/2018 0758   GLUCOSE 109 07/18/2017 0909   BUN 16 02/08/2018 0758   BUN 12.1 07/18/2017 0909   CREATININE 1.22 02/08/2018 0758   CREATININE 1.3 07/18/2017 0909   CALCIUM 9.5 02/08/2018 0758   CALCIUM 9.3 07/18/2017 0909   PROT 6.2 (L) 02/08/2018 0758   PROT 6.4 07/18/2017 0909   PROT 6.7 07/18/2017 0909   ALBUMIN 4.0 02/08/2018 0758   ALBUMIN 3.9 07/18/2017 0909   AST 12 (L) 02/08/2018 0758   AST 13 07/18/2017 0909   ALT 15 02/08/2018 0758   ALT 15 07/18/2017 0909   ALKPHOS 44 02/08/2018 0758   ALKPHOS 49 07/18/2017 0909   BILITOT 0.5 02/08/2018 0758   BILITOT 0.50 07/18/2017 0909   GFRNONAA >60 02/08/2018 0758   GFRNONAA 71 07/24/2015 1155   GFRAA >60 02/08/2018 0758   GFRAA 82 07/24/2015 1155    No results found for: SPEP, UPEP  Lab Results  Component Value Date   WBC 5.1 02/08/2018   NEUTROABS 2.2 02/08/2018   HGB 13.2 02/08/2018   HCT 37.5 (L) 02/08/2018   MCV 92.8 02/08/2018   PLT 193 02/08/2018      Chemistry      Component Value Date/Time   NA 143 02/08/2018 0758   NA 140 07/18/2017 0909   K 3.6 02/08/2018 0758   K 3.6 07/18/2017 0909   CL 109 02/08/2018 0758   CO2 27 02/08/2018 0758   CO2 24 07/18/2017 0909   BUN 16 02/08/2018 0758   BUN 12.1 07/18/2017 0909   CREATININE 1.22 02/08/2018 0758   CREATININE 1.3 07/18/2017 0909      Component Value Date/Time   CALCIUM 9.5 02/08/2018 0758   CALCIUM  9.3 07/18/2017 0909   ALKPHOS 44 02/08/2018 0758   ALKPHOS 49 07/18/2017 0909   AST 12 (L) 02/08/2018 0758   AST 13 07/18/2017 0909   ALT 15 02/08/2018 0758   ALT 15 07/18/2017 0909   BILITOT 0.5 02/08/2018 0758   BILITOT 0.50 07/18/2017 0909       All questions were answered. The patient knows to call the clinic with any problems, questions or concerns.  No barriers to learning was detected.  I spent 15 minutes counseling the patient face to face. The total time spent in the appointment was 20 minutes and more than 50% was on counseling and review of test results  Heath Lark, MD 02/08/2018 9:42 AM

## 2018-02-08 NOTE — Assessment & Plan Note (Signed)
The patient is planning to relocate to [place] in the near future I will coordinate care and will transfer his records to his new oncologist in [place]

## 2018-02-08 NOTE — Assessment & Plan Note (Signed)
I have extensive discussion with his wife regarding goals of care and future plan of care We discussed logistics of transferring his care to another facility We also discussed the plan to remain on monthly daratumumab for minimum of 2 years, potentially indefinitely if he tolerates treatment well

## 2018-02-09 LAB — KAPPA/LAMBDA LIGHT CHAINS
Kappa free light chain: 5.4 mg/L (ref 3.3–19.4)
Kappa, lambda light chain ratio: 0.98 (ref 0.26–1.65)
Lambda free light chains: 5.5 mg/L — ABNORMAL LOW (ref 5.7–26.3)

## 2018-02-11 LAB — MULTIPLE MYELOMA PANEL, SERUM
Albumin SerPl Elph-Mcnc: 3.9 g/dL (ref 2.9–4.4)
Albumin/Glob SerPl: 2.1 — ABNORMAL HIGH (ref 0.7–1.7)
Alpha 1: 0.1 g/dL (ref 0.0–0.4)
Alpha2 Glob SerPl Elph-Mcnc: 0.6 g/dL (ref 0.4–1.0)
B-Globulin SerPl Elph-Mcnc: 0.8 g/dL (ref 0.7–1.3)
Gamma Glob SerPl Elph-Mcnc: 0.3 g/dL — ABNORMAL LOW (ref 0.4–1.8)
Globulin, Total: 1.9 g/dL — ABNORMAL LOW (ref 2.2–3.9)
IGA: 29 mg/dL — AB (ref 61–437)
IGM (IMMUNOGLOBULIN M), SRM: 26 mg/dL (ref 20–172)
IgG (Immunoglobin G), Serum: 471 mg/dL — ABNORMAL LOW (ref 700–1600)
TOTAL PROTEIN ELP: 5.8 g/dL — AB (ref 6.0–8.5)

## 2018-02-14 ENCOUNTER — Telehealth: Payer: Self-pay

## 2018-02-14 ENCOUNTER — Other Ambulatory Visit: Payer: Self-pay | Admitting: Hematology and Oncology

## 2018-02-14 NOTE — Telephone Encounter (Signed)
Called and given below message. Verbalized understanding.  They have moved to [place], they arrived on Saturday. They are heading to appt with Dr. Nila Nephew today. Requested lab results faxed to Dr. Antionette Char at [phone]. Labs faxed.

## 2018-02-14 NOTE — Telephone Encounter (Signed)
-----   Message from Heath Lark, MD sent at 02/14/2018  8:05 AM EDT ----- Regarding: myeloma panel PLs call him/wife myeloma panel still look good in remission ----- Message ----- From: Heath Lark, MD Sent: 02/08/2018   8:44 AM To: Heath Lark, MD

## 2019-09-06 ENCOUNTER — Telehealth: Payer: Self-pay | Admitting: *Deleted

## 2019-09-06 NOTE — Telephone Encounter (Signed)
Records faxed to [place] for clinical studies to att [facility] - release [id number]

## 2019-10-18 NOTE — Progress Notes (Signed)
This encounter was created in error - please disregard.
